# Patient Record
Sex: Male | Born: 1950 | Race: White | Hispanic: No | Marital: Married | Smoking: Former smoker
Health system: Southern US, Community
[De-identification: ages and names within clinical notes are randomized; demographics above are authoritative.]

## PROBLEM LIST (undated history)

## (undated) DIAGNOSIS — K219 Gastro-esophageal reflux disease without esophagitis: Secondary | ICD-10-CM

## (undated) DIAGNOSIS — Z9989 Dependence on other enabling machines and devices: Secondary | ICD-10-CM

## (undated) DIAGNOSIS — M1990 Unspecified osteoarthritis, unspecified site: Secondary | ICD-10-CM

## (undated) DIAGNOSIS — G4733 Obstructive sleep apnea (adult) (pediatric): Secondary | ICD-10-CM

## (undated) DIAGNOSIS — A0472 Enterocolitis due to Clostridium difficile, not specified as recurrent: Secondary | ICD-10-CM

## (undated) DIAGNOSIS — I739 Peripheral vascular disease, unspecified: Secondary | ICD-10-CM

## (undated) DIAGNOSIS — M79606 Pain in leg, unspecified: Secondary | ICD-10-CM

## (undated) DIAGNOSIS — I2510 Atherosclerotic heart disease of native coronary artery without angina pectoris: Secondary | ICD-10-CM

## (undated) DIAGNOSIS — I10 Essential (primary) hypertension: Secondary | ICD-10-CM

## (undated) DIAGNOSIS — I219 Acute myocardial infarction, unspecified: Secondary | ICD-10-CM

## (undated) DIAGNOSIS — G4700 Insomnia, unspecified: Secondary | ICD-10-CM

## (undated) DIAGNOSIS — J8410 Pulmonary fibrosis, unspecified: Secondary | ICD-10-CM

## (undated) DIAGNOSIS — C3490 Malignant neoplasm of unspecified part of unspecified bronchus or lung: Secondary | ICD-10-CM

## (undated) DIAGNOSIS — J189 Pneumonia, unspecified organism: Secondary | ICD-10-CM

## (undated) DIAGNOSIS — M545 Low back pain, unspecified: Secondary | ICD-10-CM

## (undated) DIAGNOSIS — M4800 Spinal stenosis, site unspecified: Secondary | ICD-10-CM

## (undated) DIAGNOSIS — F32A Depression, unspecified: Secondary | ICD-10-CM

## (undated) DIAGNOSIS — N400 Enlarged prostate without lower urinary tract symptoms: Secondary | ICD-10-CM

## (undated) DIAGNOSIS — F419 Anxiety disorder, unspecified: Secondary | ICD-10-CM

## (undated) DIAGNOSIS — E785 Hyperlipidemia, unspecified: Secondary | ICD-10-CM

## (undated) DIAGNOSIS — Z9981 Dependence on supplemental oxygen: Secondary | ICD-10-CM

## (undated) DIAGNOSIS — Z87442 Personal history of urinary calculi: Secondary | ICD-10-CM

## (undated) DIAGNOSIS — G8929 Other chronic pain: Secondary | ICD-10-CM

## (undated) DIAGNOSIS — F329 Major depressive disorder, single episode, unspecified: Secondary | ICD-10-CM

## (undated) HISTORY — DX: Insomnia, unspecified: G47.00

## (undated) HISTORY — DX: Major depressive disorder, single episode, unspecified: F32.9

## (undated) HISTORY — DX: Anxiety disorder, unspecified: F41.9

## (undated) HISTORY — PX: JOINT REPLACEMENT: SHX530

## (undated) HISTORY — DX: Depression, unspecified: F32.A

## (undated) HISTORY — DX: Pain in leg, unspecified: M79.606

## (undated) HISTORY — DX: Enterocolitis due to Clostridium difficile, not specified as recurrent: A04.72

## (undated) HISTORY — DX: Essential (primary) hypertension: I10

## (undated) HISTORY — PX: SHOULDER ARTHROSCOPY WITH SUBACROMIAL DECOMPRESSION AND OPEN ROTATOR C: SHX5688

## (undated) HISTORY — DX: Peripheral vascular disease, unspecified: I73.9

## (undated) HISTORY — PX: SPINE SURGERY: SHX786

## (undated) HISTORY — PX: APPENDECTOMY: SHX54

## (undated) HISTORY — DX: Unspecified osteoarthritis, unspecified site: M19.90

## (undated) HISTORY — PX: TONSILLECTOMY AND ADENOIDECTOMY: SUR1326

## (undated) HISTORY — DX: Hyperlipidemia, unspecified: E78.5

## (undated) HISTORY — DX: Pulmonary fibrosis, unspecified: J84.10

## (undated) HISTORY — PX: KNEE ARTHROSCOPY: SHX127

## (undated) HISTORY — DX: Atherosclerotic heart disease of native coronary artery without angina pectoris: I25.10

## (undated) HISTORY — PX: BACK SURGERY: SHX140

## (undated) HISTORY — PX: CORONARY ANGIOPLASTY WITH STENT PLACEMENT: SHX49

## (undated) HISTORY — PX: LAPAROSCOPIC CHOLECYSTECTOMY: SUR755

---

## 1987-03-01 DIAGNOSIS — J189 Pneumonia, unspecified organism: Secondary | ICD-10-CM

## 1987-03-01 DIAGNOSIS — I2510 Atherosclerotic heart disease of native coronary artery without angina pectoris: Secondary | ICD-10-CM

## 1987-03-01 HISTORY — DX: Atherosclerotic heart disease of native coronary artery without angina pectoris: I25.10

## 1987-03-01 HISTORY — DX: Pneumonia, unspecified organism: J18.9

## 1987-08-29 HISTORY — PX: CARDIAC CATHETERIZATION: SHX172

## 1987-09-29 HISTORY — PX: CORONARY ARTERY BYPASS GRAFT: SHX141

## 2000-02-29 DIAGNOSIS — I219 Acute myocardial infarction, unspecified: Secondary | ICD-10-CM

## 2000-02-29 HISTORY — DX: Acute myocardial infarction, unspecified: I21.9

## 2004-02-29 HISTORY — PX: SHOULDER ARTHROSCOPY W/ ROTATOR CUFF REPAIR: SHX2400

## 2007-03-01 HISTORY — PX: POSTERIOR LUMBAR FUSION: SHX6036

## 2009-04-08 ENCOUNTER — Encounter: Admission: RE | Admit: 2009-04-08 | Discharge: 2009-04-08 | Payer: Self-pay | Admitting: Anesthesiology

## 2010-02-28 HISTORY — PX: TOTAL HIP ARTHROPLASTY: SHX124

## 2010-03-21 ENCOUNTER — Encounter: Payer: Self-pay | Admitting: Orthopedic Surgery

## 2010-03-21 ENCOUNTER — Encounter: Payer: Self-pay | Admitting: Anesthesiology

## 2010-05-06 ENCOUNTER — Other Ambulatory Visit: Payer: Self-pay | Admitting: Anesthesiology

## 2010-05-06 ENCOUNTER — Ambulatory Visit
Admission: RE | Admit: 2010-05-06 | Discharge: 2010-05-06 | Disposition: A | Discharge status: Home / Self Care | Payer: BC Managed Care – HMO | Source: Ambulatory Visit | Attending: Anesthesiology | Admitting: Anesthesiology

## 2010-05-06 DIAGNOSIS — R52 Pain, unspecified: Secondary | ICD-10-CM

## 2010-09-06 ENCOUNTER — Inpatient Hospital Stay (HOSPITAL_BASED_OUTPATIENT_CLINIC_OR_DEPARTMENT_OTHER)
Admission: RE | Admit: 2010-09-06 | Discharge: 2010-09-06 | Disposition: A | Discharge status: Home / Self Care | Payer: BC Managed Care – HMO | Source: Ambulatory Visit | Attending: Interventional Cardiology | Admitting: Interventional Cardiology

## 2010-09-06 DIAGNOSIS — I25810 Atherosclerosis of coronary artery bypass graft(s) without angina pectoris: Secondary | ICD-10-CM | POA: Insufficient documentation

## 2010-09-06 DIAGNOSIS — R9439 Abnormal result of other cardiovascular function study: Secondary | ICD-10-CM | POA: Insufficient documentation

## 2010-09-23 NOTE — Cardiovascular Report (Signed)
NAME:  Richard Parrish, Richard Parrish NO.:  [id number]  MEDICAL RECORD NO.:  [id number]  LOCATION:                                 FACILITY:  PHYSICIAN:  Lyn Records, M.D.        DATE OF BIRTH:  DATE OF PROCEDURE:  09/06/2010 DATE OF DISCHARGE:                           CARDIAC CATHETERIZATION   INDICATIONS:  The patient underwent a nuclear perfusion study to assess risk prior to right hip replacement and rehabilitation.  The study demonstrated transient left ventricular enlargement with stress and also a larger defect involving the anterolateral and inferior wall.  The study is being done to assess coronary anatomy.  PROCEDURE PERFORMED: 1. Left heart cath. 2. Selective coronary angiography. 3. Left ventriculography. 4. Saphenous vein graft angiography.  DESCRIPTION:  A 4-French sheath was placed in the right femoral artery after 2 mg of Versed and 50 mcg of fentanyl.  We then used a 4-French A2 multipurpose catheter for hemodynamic recordings, left ventriculography by hand injection, and saphenous vein graft angiography.  We then used a 4-French #4 left Judkins catheter for left coronary angiography and a #4 right Judkins catheter for right coronary angiography.  Manual compression was used for hemostasis.  No complications occurred.  RESULTS: 1. Hemodynamic data:     a.     Aortic pressure 101/50 mmHg.     b.     Left ventricular pressure 102/21. 2. Left ventriculography:  Left ventricle demonstrates normal overall     contractility with EF of 55% and suggestion of mild inferior     hypokinesis.  No mitral regurgitation. 3. Coronary angiography.     a.     The left main coronary:  The left main coronary artery      contains no significant obstruction.     b.     Left anterior descending coronary:  Left anterior descending      is anatomically relatively small in distribution, just reaching      the left ventricular apex.  Gives origin to a large branching   first diagonal.  It also gives origin to a large septal      perforator.  No significant obstruction is noted in the LAD or its      branches.     c.     Circumflex artery:  Circumflex coronary artery is relatively      small in distribution.  It gives origin to a moderate-sized first      obtuse marginal and a smaller second obtuse marginal.  No      significant obstruction is noted.     d.     Right coronary:  The right coronary artery is totally      occluded in the aortoostial junction.  A conus branch arises from      this site.  The distal right coronary is supplied by a patent      saphenous vein graft. 4. Bypass graft angiography.     a.     Saphenous vein graft to the distal right coronary:  The      graft is large in diameter, patulous, and supplies an anastomosis  to the distal right coronary.  Beyond this anastomosis, a large      PDA and left ventricular branch arises.  The bypass graft itself      reveals a widely patent stent in the mid-to-distal body proximal      to which there is bulky eccentric disease throughout a region of      irregularity.  This entire region is obstructed in the 50-70%      range depending upon view.  No significant obstruction is noted in      the PDA or large left ventricular branch.  The PDA supplies the      apex.  ASSESSMENT: 1. Widely patent left coronary system with only mild-to-moderate     narrowing in the first diagonal.  The left coronary system is     relatively small. 2. Dominant native right coronary system with total occlusion of     proximal vessel. 3. Patent but diffusely diseased saphenous vein graft to the distal     right coronary with 50-75% intermedius stenosis in the mid and     proximal segment proximal to previously placed stent.  RECOMMENDATIONS:  The patient's right coronary saphenous vein graft is old and degenerated.  The proximal segment likely need stenting although there is not a high-grade obstruction  noted.  My clinical impression is that the patient should probably undergo his hip surgery firs and at some later date once he is recovered, we can come back and attempt to place a drug-eluting stent in this proximal-to-mid diffusely diseased segment using distal protection.  My consideration for not going forward with drug-eluting stenting is the high risk of complications with this graft and the need for prolonged dual-antiplatelet therapy.  He appears to have widely patent enough graft to be able to tolerate surgery and rehab without excessive risk.     Lyn Records, M.D.     HWS/MEDQ  D:  09/06/2010  T:  09/07/2010  Job:  [id number]  cc:   Dr. Doristine Counter Carlena Bjornstad D. Polite, M.D. Dr. Patsi Sears  Electronically Signed by Verdis Prime M.D. on 09/23/2010 11:28:11 AM

## 2011-02-17 ENCOUNTER — Other Ambulatory Visit: Payer: Self-pay | Admitting: Internal Medicine

## 2011-02-17 ENCOUNTER — Ambulatory Visit
Admission: RE | Admit: 2011-02-17 | Discharge: 2011-02-17 | Disposition: A | Discharge status: Home / Self Care | Payer: BC Managed Care – HMO | Source: Ambulatory Visit | Attending: Internal Medicine | Admitting: Internal Medicine

## 2011-02-17 DIAGNOSIS — M79669 Pain in unspecified lower leg: Secondary | ICD-10-CM

## 2011-02-25 ENCOUNTER — Encounter: Payer: Self-pay | Admitting: Surgery

## 2011-02-28 ENCOUNTER — Ambulatory Visit (INDEPENDENT_AMBULATORY_CARE_PROVIDER_SITE_OTHER): Payer: BC Managed Care – HMO | Admitting: Surgery

## 2011-02-28 ENCOUNTER — Encounter: Payer: Self-pay | Admitting: Surgery

## 2011-02-28 ENCOUNTER — Encounter (HOSPITAL_COMMUNITY): Payer: Self-pay | Admitting: Pharmacy Technician

## 2011-02-28 VITALS — BP 142/87 | HR 64 | Resp 16 | Ht 72.0 in | Wt 246.0 lb

## 2011-02-28 DIAGNOSIS — I739 Peripheral vascular disease, unspecified: Secondary | ICD-10-CM

## 2011-02-28 NOTE — Progress Notes (Signed)
[facility]   Patient name: Richard Parrish MRN: [id number] DOB: 12/03/1950 Sex: male   Referred by: Dr. Nehemiah Settle  Reason for referral:  Chief Complaint  Patient presents with  . PVD    claudication, abn. ABI's    HISTORY OF PRESENT ILLNESS: The patient comes in today for evaluation of claudication. In the left leg. He states that he can walk approximately 100 yards before his leg is L. It is alleviated with rest. However if he works or exercises to the point where his leg is in trouble a can take several days for her to recover. He recently had a duplex ultrasound which showed ABI on the right to the 0.92 and on the left be 0.49. This problem has been going on for several uterus. He has undergone hip and back surgery thinking that these were contributing factors however his pain and issues still persist.  The patient has an extensive vascular history. He underwent coronary artery bypass grafting in his 30s. He has subsequently undergone stenting to his right coronary and his LAD in 2001. He is a former smoker, he quit in 1990. He is medically managed for his hypertension and hypercholesterolemia.  The patient states that his left leg is extremely lifestyle limiting to him and wishes to have this addressed as soon as possible. He has undergone a venous ultrasound which was negative for DVT. He denies ulcers or nonhealing wounds.  Past Medical History  Diagnosis Date  . CAD (coronary artery disease)   . Hypertension   . Hyperlipidemia   . Anxiety   . Insomnia   . Pulmonary fibrosis   . Leg pain     Past Surgical History  Procedure Date  . Coronary artery bypass graft   . Rca stent   . Rca and rca stents     History   Social History  . Marital Status: Unknown    Spouse Name: N/A    Number of Children: N/A  . Years of Education: N/A   Occupational History  . Not on file.   Social History Main Topics  . Smoking status: Former Smoker    Quit  date: 02/24/1989  . Smokeless tobacco: Not on file  . Alcohol Use: Yes  . Drug Use: No  . Sexually Active:    Other Topics Concern  . Not on file   Social History Narrative  . No narrative on file    History reviewed. No pertinent family history.  Allergies as of 02/28/2011 - Review Complete 02/28/2011  Allergen Reaction Noted  . Ambien  02/25/2011    Current Outpatient Prescriptions on File Prior to Visit  Medication Sig Dispense Refill  . ALPRAZolam (XANAX) 0.5 MG tablet Take 0.5 mg by mouth at bedtime as needed.        Marland Kitchen aspirin 81 MG tablet Take 160 mg by mouth daily.        Marland Kitchen atorvastatin (LIPITOR) 40 MG tablet Take 40 mg by mouth daily.        Marland Kitchen ezetimibe (ZETIA) 10 MG tablet Take 10 mg by mouth daily.        Marland Kitchen gabapentin (NEURONTIN) 300 MG capsule Take 300 mg by mouth 3 (three) times daily.        Marland Kitchen HYDROcodone-acetaminophen (NORCO) 5-325 MG per tablet Take 1 tablet by mouth every 4 (four) hours as needed.        . hyoscyamine (ANASPAZ) 0.125 MG TBDP Place 0.125 mg under the tongue as  needed.        Marland Kitchen ibuprofen (ADVIL,MOTRIN) 200 MG tablet Take 200 mg by mouth every 6 (six) hours as needed.        . metaxalone (SKELAXIN) 800 MG tablet Take 800 mg by mouth 3 (three) times daily.        . metoprolol (TOPROL-XL) 50 MG 24 hr tablet Take 50 mg by mouth daily.        . nitroGLYCERIN (NITROSTAT) 0.4 MG SL tablet Place 0.4 mg under the tongue every 5 (five) minutes as needed.        Marland Kitchen omeprazole (PRILOSEC) 20 MG capsule Take 20 mg by mouth daily.        Marland Kitchen PARoxetine (PAXIL-CR) 25 MG 24 hr tablet Take 25 mg by mouth every morning.        . propranolol (INDERAL) 40 MG tablet Take 40 mg by mouth as needed.        . ramipril (ALTACE) 10 MG capsule Take 10 mg by mouth 2 (two) times daily.           REVIEW OF SYSTEMS: Cardiovascular: No chest pain, chest pressure, palpitations, orthopnea, or dyspnea on exertion. No claudication or rest pain,  No history of DVT or  phlebitis. Pulmonary: No productive cough, asthma or wheezing. Neurologic: No weakness, paresthesias, aphasia, or amaurosis. No dizziness. Hematologic: No bleeding problems or clotting disorders. Musculoskeletal: No joint pain or joint swelling. Gastrointestinal: No blood in stool or hematemesis Genitourinary: No dysuria or hematuria. Psychiatric:: No history of major depression. Integumentary: No rashes or ulcers. Constitutional: No fever or chills.  PHYSICAL EXAMINATION: General: The patient appears their stated age.  Vital signs are BP 142/87  Pulse 64  Resp 16  Ht 6' (1.829 m)  Wt 246 lb (111.585 kg)  BMI 33.36 kg/m2  SpO2 99% HEENT:  No gross abnormalities Pulmonary: Respirations are non-labored Abdomen: Soft and non-tender  Musculoskeletal: There are no major deformities.   Neurologic: No focal weakness or paresthesias are detected, Skin: There are no ulcer or rashes noted. Psychiatric: The patient has normal affect. Cardiovascular: There is a regular rate and rhythm without significant murmur appreciated. No carotid bruits palpable femoral pulses bilaterally  Diagnostic Studies: None  Medication Changes: None  Assessment:  Left leg claudication Plan: Discussed treatment options at this point which would include medical therapy versus proceeding with an angiogram. The patient very much wishes to proceed with some form of intervention. I discussed proceeding with an angiogram. I'll access the right groin and study both legs and intervene on the left leg if possible. We discussed the risks and complications and the fact that he may be a surgical candidate and I percutaneous candidate. He was get this done as soon as possible. I will schedule his procedure for Wednesday January 16     V. Charlena Cross, M.D. [facility] [place] Office: [phone] Pager:  [phone]

## 2011-03-03 ENCOUNTER — Other Ambulatory Visit: Payer: Self-pay

## 2011-03-15 ENCOUNTER — Encounter (HOSPITAL_COMMUNITY)
Admission: RE | Disposition: A | Discharge status: Home / Self Care | Payer: Self-pay | Source: Ambulatory Visit | Attending: Surgery

## 2011-03-15 ENCOUNTER — Ambulatory Visit (HOSPITAL_COMMUNITY)
Admission: RE | Admit: 2011-03-15 | Discharge: 2011-03-15 | Disposition: A | Discharge status: Home / Self Care | Payer: BC Managed Care – HMO | Source: Ambulatory Visit | Attending: Surgery | Admitting: Surgery

## 2011-03-15 ENCOUNTER — Other Ambulatory Visit: Payer: Self-pay

## 2011-03-15 DIAGNOSIS — G4700 Insomnia, unspecified: Secondary | ICD-10-CM | POA: Insufficient documentation

## 2011-03-15 DIAGNOSIS — F411 Generalized anxiety disorder: Secondary | ICD-10-CM | POA: Insufficient documentation

## 2011-03-15 DIAGNOSIS — I10 Essential (primary) hypertension: Secondary | ICD-10-CM | POA: Insufficient documentation

## 2011-03-15 DIAGNOSIS — I2510 Atherosclerotic heart disease of native coronary artery without angina pectoris: Secondary | ICD-10-CM | POA: Insufficient documentation

## 2011-03-15 DIAGNOSIS — J8410 Pulmonary fibrosis, unspecified: Secondary | ICD-10-CM | POA: Insufficient documentation

## 2011-03-15 DIAGNOSIS — E785 Hyperlipidemia, unspecified: Secondary | ICD-10-CM | POA: Insufficient documentation

## 2011-03-15 DIAGNOSIS — I70219 Atherosclerosis of native arteries of extremities with intermittent claudication, unspecified extremity: Secondary | ICD-10-CM | POA: Insufficient documentation

## 2011-03-15 DIAGNOSIS — I708 Atherosclerosis of other arteries: Secondary | ICD-10-CM | POA: Insufficient documentation

## 2011-03-15 HISTORY — PX: ABDOMINAL AORTAGRAM: SHX5454

## 2011-03-15 LAB — POCT I-STAT, CHEM 8
BUN: 24 mg/dL — ABNORMAL HIGH (ref 6–23)
Chloride: 107 mEq/L (ref 96–112)
Creatinine, Ser: 1 mg/dL (ref 0.50–1.35)
Potassium: 4 mEq/L (ref 3.5–5.1)
Sodium: 142 mEq/L (ref 135–145)
TCO2: 26 mmol/L (ref 0–100)

## 2011-03-15 LAB — POCT ACTIVATED CLOTTING TIME: Activated Clotting Time: 204 seconds

## 2011-03-15 SURGERY — ABDOMINAL AORTAGRAM
Anesthesia: LOCAL

## 2011-03-15 MED ORDER — SODIUM CHLORIDE 0.9 % IV SOLN: 1.0000 mL/kg/h | INTRAVENOUS | Status: DC

## 2011-03-15 MED ORDER — OXYCODONE-ACETAMINOPHEN 5-325 MG PO TABS
ORAL_TABLET | ORAL | Status: AC
  Filled 2011-03-15: qty 2

## 2011-03-15 MED ORDER — MIDAZOLAM HCL 2 MG/2ML IJ SOLN
INTRAMUSCULAR | Status: AC
  Filled 2011-03-15: qty 2

## 2011-03-15 MED ORDER — FENTANYL CITRATE 0.05 MG/ML IJ SOLN
INTRAMUSCULAR | Status: AC
  Filled 2011-03-15: qty 2

## 2011-03-15 MED ORDER — OXYCODONE-ACETAMINOPHEN 5-325 MG PO TABS
1.0000 | ORAL_TABLET | ORAL | Status: DC | PRN
  Administered 2011-03-15: 2 via ORAL

## 2011-03-15 MED ORDER — HEPARIN SODIUM (PORCINE) 1000 UNIT/ML IJ SOLN
INTRAMUSCULAR | Status: AC
  Filled 2011-03-15: qty 1

## 2011-03-15 MED ORDER — LIDOCAINE HCL (PF) 1 % IJ SOLN
INTRAMUSCULAR | Status: AC
  Filled 2011-03-15: qty 30

## 2011-03-15 MED ORDER — SODIUM CHLORIDE 0.9 % IV SOLN
INTRAVENOUS | Status: DC
  Administered 2011-03-15: 06:00:00 via INTRAVENOUS

## 2011-03-15 MED ORDER — HEPARIN (PORCINE) IN NACL 2-0.9 UNIT/ML-% IJ SOLN
INTRAMUSCULAR | Status: AC
  Filled 2011-03-15: qty 1000

## 2011-03-15 NOTE — Op Note (Signed)
[facility] [place]  Patient name: Richard Parrish MRN: [id number] DOB: 10-03-1950 Sex: male  03/15/2011 Pre-operative Diagnosis: Left leg claudication Post-operative diagnosis:  Same Surgeon:  Jorge Ny Procedure Performed:  1.  ultrasound access right femoral artery  2.  abdominal aortogram  3.  bilateral lower sternum a runoff  4.  third order catheterization  5.  failed attempt at subintimal recanalization     Indications:  This is a 61 year old gentleman lifestyle limiting claudication in the left leg. Ultrasound revealed decreased ankle brachial indices on the left. He comes in today for diagnostic study and possible intervention.  Procedure:  The patient was identified in the holding area and taken to room 8.  The patient was then placed supine on the table and prepped and draped in the usual sterile fashion.  A time out was called.  Ultrasound was used to evaluate the right common femoral artery.  It was patent .  A digital ultrasound image was acquired.  A 18-gauge needle was used to access the right common femoral artery under ultrasound guidance. An 035 wire was advanced into the aorta under fluoroscopic visualization. A 5 French sheath was placed.  An omniflush catheter was advanced over the wire to the level of L-1.  An abdominal angiogram was obtained.  Next, using the omniflush catheter and a benson wire, the aortic bifurcation was crossed and the catheter was placed into theleft external iliac artery and left runoff was obtained.  right runoff was performed via retrograde sheath injections.  Findings:   Aortogram:  The visualized portions of the suprarenal abdominal aorta showed no significant disease. There is no evidence of renal artery stenosis. There is a small infrarenal abdominal aortic aneurysm. Bilateral common and external iliac arteries are widely patent. There is a stenosis at the origin of the left hypogastric artery.  Right Lower  Extremity:  The right common femoral artery is widely patent right profunda femoral and superficial femoral artery are widely patent. The popliteal artery is patent throughout it's course. There is two-vessel runoff.  Left Lower Extremity:  The left common femoral artery is patent throughout is course. The profundofemoral artery is widely patent. Left superficial femoral artery occludes in the adductor canal. There is reconstitution of the above-knee popliteal artery. The main runoff is via the posterior tibial artery. All 3 tibial vessels are seen at the midcalf level. Presumably proximal disease skewers full visualization.  Intervention:  After the above images were obtained the decision was made to intervene. Over a Amplatz superstiff wire a 7 [place] Ansel 1 sheath was placed into the left external iliac artery. Next using a Glidewire and a quick cross catheter the superficial femoral artery was selected. Multiple attempts at subintimal recanalization were performed without successful reentry into the native artery. After significant failed attempts I elected to abort the procedure. Catheters and wires were removed patient taken the holding area for sheath pull.  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  #3  in a runoff the left leg as to the posterior tibial artery, however all 3 tibial vessels are seen in the mid calf. This may be the consequence of proximal disease.  #4  no significant disease within the right leg  #5  the patient will be brought back for discussions of repeat attempt at subintimal recanalization versus above-knee femoral-popliteal bypass    V. Durene Cal, M.D. [facility] [place] Office: [phone] Pager:  [phone]

## 2011-03-15 NOTE — Interval H&P Note (Signed)
History and Physical Interval Note:  03/15/2011 7:26 AM  Richard Parrish  has presented today for surgery, with the diagnosis of PVD  The various methods of treatment have been discussed with the patient and family. After consideration of risks, benefits and other options for treatment, the patient has consented to  Procedure(s): ABDOMINAL AORTAGRAM as a surgical intervention .  The patients' history has been reviewed, patient examined, no change in status, stable for surgery.  I have reviewed the patients' chart and labs.  Questions were answered to the patient's satisfaction.     BRABHAM IV, V. WELLS

## 2011-03-15 NOTE — H&P (View-Only) (Signed)
[facility] [place]   Patient name: Richard Parrish MRN: [id number] DOB: 08/17/1950 Sex: male   Referred by: Dr. Polite  Reason for referral:  Chief Complaint  Patient presents with  . PVD    claudication, abn. ABI's    HISTORY OF PRESENT ILLNESS: The patient comes in today for evaluation of claudication. In the left leg. He states that he can walk approximately 100 yards before his leg is L. It is alleviated with rest. However if he works or exercises to the point where his leg is in trouble a can take several days for her to recover. He recently had a duplex ultrasound which showed ABI on the right to the 0.92 and on the left be 0.49. This problem has been going on for several uterus. He has undergone hip and back surgery thinking that these were contributing factors however his pain and issues still persist.  The patient has an extensive vascular history. He underwent coronary artery bypass grafting in his 30s. He has subsequently undergone stenting to his right coronary and his LAD in 2001. He is a former smoker, he quit in 1990. He is medically managed for his hypertension and hypercholesterolemia.  The patient states that his left leg is extremely lifestyle limiting to him and wishes to have this addressed as soon as possible. He has undergone a venous ultrasound which was negative for DVT. He denies ulcers or nonhealing wounds.  Past Medical History  Diagnosis Date  . CAD (coronary artery disease)   . Hypertension   . Hyperlipidemia   . Anxiety   . Insomnia   . Pulmonary fibrosis   . Leg pain     Past Surgical History  Procedure Date  . Coronary artery bypass graft   . Rca stent   . Rca and rca stents     History   Social History  . Marital Status: Unknown    Spouse Name: N/A    Number of Children: N/A  . Years of Education: N/A   Occupational History  . Not on file.   Social History Main Topics  . Smoking status: Former Smoker    Quit  date: 02/24/1989  . Smokeless tobacco: Not on file  . Alcohol Use: Yes  . Drug Use: No  . Sexually Active:    Other Topics Concern  . Not on file   Social History Narrative  . No narrative on file    History reviewed. No pertinent family history.  Allergies as of 02/28/2011 - Review Complete 02/28/2011  Allergen Reaction Noted  . Ambien  02/25/2011    Current Outpatient Prescriptions on File Prior to Visit  Medication Sig Dispense Refill  . ALPRAZolam (XANAX) 0.5 MG tablet Take 0.5 mg by mouth at bedtime as needed.        . aspirin 81 MG tablet Take 160 mg by mouth daily.        . atorvastatin (LIPITOR) 40 MG tablet Take 40 mg by mouth daily.        . ezetimibe (ZETIA) 10 MG tablet Take 10 mg by mouth daily.        . gabapentin (NEURONTIN) 300 MG capsule Take 300 mg by mouth 3 (three) times daily.        . HYDROcodone-acetaminophen (NORCO) 5-325 MG per tablet Take 1 tablet by mouth every 4 (four) hours as needed.        . hyoscyamine (ANASPAZ) 0.125 MG TBDP Place 0.125 mg under the tongue as   needed.        . ibuprofen (ADVIL,MOTRIN) 200 MG tablet Take 200 mg by mouth every 6 (six) hours as needed.        . metaxalone (SKELAXIN) 800 MG tablet Take 800 mg by mouth 3 (three) times daily.        . metoprolol (TOPROL-XL) 50 MG 24 hr tablet Take 50 mg by mouth daily.        . nitroGLYCERIN (NITROSTAT) 0.4 MG SL tablet Place 0.4 mg under the tongue every 5 (five) minutes as needed.        . omeprazole (PRILOSEC) 20 MG capsule Take 20 mg by mouth daily.        . PARoxetine (PAXIL-CR) 25 MG 24 hr tablet Take 25 mg by mouth every morning.        . propranolol (INDERAL) 40 MG tablet Take 40 mg by mouth as needed.        . ramipril (ALTACE) 10 MG capsule Take 10 mg by mouth 2 (two) times daily.           REVIEW OF SYSTEMS: Cardiovascular: No chest pain, chest pressure, palpitations, orthopnea, or dyspnea on exertion. No claudication or rest pain,  No history of DVT or  phlebitis. Pulmonary: No productive cough, asthma or wheezing. Neurologic: No weakness, paresthesias, aphasia, or amaurosis. No dizziness. Hematologic: No bleeding problems or clotting disorders. Musculoskeletal: No joint pain or joint swelling. Gastrointestinal: No blood in stool or hematemesis Genitourinary: No dysuria or hematuria. Psychiatric:: No history of major depression. Integumentary: No rashes or ulcers. Constitutional: No fever or chills.  PHYSICAL EXAMINATION: General: The patient appears their stated age.  Vital signs are BP 142/87  Pulse 64  Resp 16  Ht 6' (1.829 m)  Wt 246 lb (111.585 kg)  BMI 33.36 kg/m2  SpO2 99% HEENT:  No gross abnormalities Pulmonary: Respirations are non-labored Abdomen: Soft and non-tender  Musculoskeletal: There are no major deformities.   Neurologic: No focal weakness or paresthesias are detected, Skin: There are no ulcer or rashes noted. Psychiatric: The patient has normal affect. Cardiovascular: There is a regular rate and rhythm without significant murmur appreciated. No carotid bruits palpable femoral pulses bilaterally  Diagnostic Studies: None  Medication Changes: None  Assessment:  Left leg claudication Plan: Discussed treatment options at this point which would include medical therapy versus proceeding with an angiogram. The patient very much wishes to proceed with some form of intervention. I discussed proceeding with an angiogram. I'll access the right groin and study both legs and intervene on the left leg if possible. We discussed the risks and complications and the fact that he may be a surgical candidate and I percutaneous candidate. He was get this done as soon as possible. I will schedule his procedure for Wednesday January 16     V. Wells Brabham IV, M.D. [facility] [place] Office: [phone] Pager:  [phone]   

## 2011-03-16 LAB — POCT ACTIVATED CLOTTING TIME
Activated Clotting Time: 188 seconds
Activated Clotting Time: 166 seconds

## 2011-03-22 ENCOUNTER — Other Ambulatory Visit: Payer: Self-pay

## 2011-03-31 ENCOUNTER — Encounter (HOSPITAL_COMMUNITY): Payer: Self-pay | Admitting: Pharmacy Technician

## 2011-04-11 MED ORDER — SODIUM CHLORIDE 0.9 % IV SOLN
INTRAVENOUS | Status: DC
  Administered 2011-04-12: 1000 mL via INTRAVENOUS

## 2011-04-12 ENCOUNTER — Other Ambulatory Visit: Payer: Self-pay | Admitting: *Deleted

## 2011-04-12 ENCOUNTER — Ambulatory Visit (HOSPITAL_COMMUNITY)
Admission: RE | Admit: 2011-04-12 | Discharge: 2011-04-12 | Disposition: A | Discharge status: Home / Self Care | Payer: BC Managed Care – PPO | Source: Ambulatory Visit | Attending: Surgery | Admitting: Surgery

## 2011-04-12 ENCOUNTER — Encounter (HOSPITAL_COMMUNITY)
Admission: RE | Disposition: A | Discharge status: Home / Self Care | Payer: Self-pay | Source: Ambulatory Visit | Attending: Surgery

## 2011-04-12 DIAGNOSIS — I739 Peripheral vascular disease, unspecified: Secondary | ICD-10-CM

## 2011-04-12 DIAGNOSIS — I10 Essential (primary) hypertension: Secondary | ICD-10-CM | POA: Insufficient documentation

## 2011-04-12 DIAGNOSIS — I70219 Atherosclerosis of native arteries of extremities with intermittent claudication, unspecified extremity: Secondary | ICD-10-CM | POA: Insufficient documentation

## 2011-04-12 DIAGNOSIS — E785 Hyperlipidemia, unspecified: Secondary | ICD-10-CM | POA: Insufficient documentation

## 2011-04-12 DIAGNOSIS — I2510 Atherosclerotic heart disease of native coronary artery without angina pectoris: Secondary | ICD-10-CM | POA: Insufficient documentation

## 2011-04-12 HISTORY — PX: ABDOMINAL AORTAGRAM: SHX5454

## 2011-04-12 LAB — POCT ACTIVATED CLOTTING TIME
Activated Clotting Time: 199 seconds
Activated Clotting Time: 199 seconds
Activated Clotting Time: 199 seconds

## 2011-04-12 SURGERY — ABDOMINAL AORTAGRAM
Anesthesia: LOCAL

## 2011-04-12 MED ORDER — HEPARIN (PORCINE) IN NACL 2-0.9 UNIT/ML-% IJ SOLN
INTRAMUSCULAR | Status: AC
  Filled 2011-04-12: qty 1000

## 2011-04-12 MED ORDER — MIDAZOLAM HCL 2 MG/2ML IJ SOLN
INTRAMUSCULAR | Status: AC
  Filled 2011-04-12: qty 2

## 2011-04-12 MED ORDER — CLOPIDOGREL BISULFATE 300 MG PO TABS
ORAL_TABLET | ORAL | Status: AC
  Filled 2011-04-12: qty 1

## 2011-04-12 MED ORDER — LIDOCAINE HCL (PF) 1 % IJ SOLN
INTRAMUSCULAR | Status: AC
  Filled 2011-04-12: qty 30

## 2011-04-12 MED ORDER — CLOPIDOGREL BISULFATE 75 MG PO TABS: 75.0000 mg | ORAL_TABLET | Freq: Every day | ORAL | Status: DC

## 2011-04-12 MED ORDER — FAMOTIDINE IN NACL 20-0.9 MG/50ML-% IV SOLN
INTRAVENOUS | Status: AC
  Filled 2011-04-12: qty 50

## 2011-04-12 MED ORDER — HYDROCODONE-ACETAMINOPHEN 5-325 MG PO TABS
2.0000 | ORAL_TABLET | Freq: Once | ORAL | Status: AC
  Administered 2011-04-12: 2 via ORAL

## 2011-04-12 MED ORDER — FENTANYL CITRATE 0.05 MG/ML IJ SOLN
INTRAMUSCULAR | Status: AC
  Filled 2011-04-12: qty 2

## 2011-04-12 MED ORDER — HYDROCODONE-ACETAMINOPHEN 5-325 MG PO TABS
ORAL_TABLET | ORAL | Status: AC
  Administered 2011-04-12: 2 via ORAL
  Filled 2011-04-12: qty 2

## 2011-04-12 MED ORDER — CLOPIDOGREL BISULFATE 300 MG PO TABS: 300.0000 mg | ORAL_TABLET | Freq: Once | ORAL | Status: DC

## 2011-04-12 MED ORDER — CLOPIDOGREL BISULFATE 75 MG PO TABS: 300.0000 mg | ORAL_TABLET | Freq: Once | ORAL | Status: DC

## 2011-04-12 NOTE — Op Note (Signed)
[facility]  Patient name: Richard Parrish MRN: [id number] DOB: 08-23-1950 Sex: male  04/12/2011 Pre-operative Diagnosis: Left leg claudication Post-operative diagnosis:  Same Surgeon:  Jorge Ny Procedure Performed:  1.  ultrasound access right femoral artery  2.  abdominal aortogram  3.  left lower extremity  runoff  4.  third order catheterization  5.  atherectomy left superficial femoral and popliteal artery  6.  stent left superficial femoral and popliteal artery    Indications:  Patient suffers from lifestyle limiting left leg claudication. Over a month ago he was brought in for diagnostic study and possible intervention. A occluded left superficial femoral and popliteal artery were found. I attempted to cross the lesion with a catheter and a wire but was unsuccessful. He comes back in today for a second attempt using the Crosser CTO/atherectomy device.  Procedure:  The patient was identified in the holding area and taken to room 8.  The patient was then placed supine on the table and prepped and draped in the usual sterile fashion.  A time out was called.  Ultrasound was used to evaluate the right common femoral artery.  It was patent .  A digital ultrasound image was acquired. The right femoral artery was accessed under ultrasound guidance with an 18-gauge needle. An 035 wire was advanced into the aorta under fluoroscopic visualization. A 5 French sheath was placed. Over the wire an omni-flush catheter was advanced to the level of L1 and an abdominal aortogram was obtained. Next using the Omni flush catheter and a Benson wire the aortic bifurcation was crossed. The catheter was placed in the left external iliac artery and a left leg runoff was performed.  Findings:   Aortogram:  Single renal arteries were identified bilaterally without evidence of stenosis. No significant infrarenal aortic disease was identified. No significant stenosis within the  iliac arterial system was identified. The hypogastric origin is obscured due to the hardware within his back  Left Lower Extremity:  Left common femoral artery is widely patent. Left profunda femoris widely patent the left superficial femoral artery is patent proximally however it does occlude in the adductor canal with reconstitution of the above-knee popliteal artery. Three-vessel runoff was obtained however the posterior tibial is the dominant vessel.  Intervention:  Over no 35 wire a 7 French sheath was placed into the distal left external iliac artery. The patient was fully heparinized. At the level was confirmed and checked and redosed based on his activated clotting time. Using the super catheter for the Crosser device the left superficial femoral artery was selected. The catheter was brought down to the origin of the stenosis. Next over a miracle brothers wire the 14S Crosser CTO/atherectomy device was loaded into the catheter and brought down to the stenosis. The device was then activated and used to cross the proximal As well as the stenosis. I performed an injection through theCrosser once I felt I had reentered the true lumen. This confirmed successful reentry in successful crossing with atherectomy of the lesion. The device was then removed. I predilated the tract using a 4 mm balloon I then elected to primarily stent this stenosis. I selected a Bard life stent solo 7 x 200 stent. It was then deployed within the proximal popliteal and distal superficial femoral artery. It was molded to confirmation with a 6 mm balloon. There was residual stenosis where the occluded area was I redilated this with the balloon with a slightly improved result. I  did not feel aggressive dilatation was warranted at this time. A completion study was performed which revealed resolution of the stenosis within the superficial femoral artery and popliteal artery. Runoff was unchanged. At this point catheters and wires were  removed the sheath was taken back to the right external iliac artery will be taken the holding area for sheath pull once the coagulation profile corrects  Impression:  #1  successful crossing of this total occlusion using theCrosser CTO/atherectomy device  #2  atherectomy and stenting of the left superficial femoral and popliteal artery with a 7 x 200 self-expanding   V. Durene Cal, M.D. [facility] [place] Office: [phone] Pager:  [phone]

## 2011-04-12 NOTE — Discharge Instructions (Signed)

## 2011-04-12 NOTE — H&P (Signed)
Patient name: Richard Parrish MRN: [id number] DOB: 22-Jan-1951 Sex: male  Referred by: Dr. Nehemiah Settle  Reason for referral:  Chief Complaint   Patient presents with   .  PVD     claudication, abn. ABI's    HISTORY OF PRESENT ILLNESS:  The patient comes in today for evaluation of claudication. In the left leg. He states that he can walk approximately 100 yards before his leg is L. It is alleviated with rest. However if he works or exercises to the point where his leg is in trouble a can take several days for her to recover. He recently had a duplex ultrasound which showed ABI on the right to the 0.92 and on the left be 0.49. This problem has been going on for several uterus. He has undergone hip and back surgery thinking that these were contributing factors however his pain and issues still persist.  The patient has an extensive vascular history. He underwent coronary artery bypass grafting in his 30s. He has subsequently undergone stenting to his right coronary and his LAD in 2001. He is a former smoker, he quit in 1990. He is medically managed for his hypertension and hypercholesterolemia.  The patient states that his left leg is extremely lifestyle limiting to him and wishes to have this addressed as soon as possible. He has undergone a venous ultrasound which was negative for DVT. He denies ulcers or nonhealing wounds.  Past Medical History   Diagnosis  Date   .  CAD (coronary artery disease)    .  Hypertension    .  Hyperlipidemia    .  Anxiety    .  Insomnia    .  Pulmonary fibrosis    .  Leg pain     Past Surgical History   Procedure  Date   .  Coronary artery bypass graft    .  Rca stent    .  Rca and rca stents     History    Social History   .  Marital Status:  Unknown     Spouse Name:  N/A     Number of Children:  N/A   .  Years of Education:  N/A    Occupational History   .  Not on file.    Social History Main Topics   .  Smoking status:  Former Smoker     Quit date:   02/24/1989   .  Smokeless tobacco:  Not on file   .  Alcohol Use:  Yes   .  Drug Use:  No   .  Sexually Active:     Other Topics  Concern   .  Not on file    Social History Narrative   .  No narrative on file    History reviewed. No pertinent family history.  Allergies as of 02/28/2011 - Review Complete 02/28/2011   Allergen  Reaction  Noted   .  Ambien   02/25/2011    Current Outpatient Prescriptions on File Prior to Visit   Medication  Sig  Dispense  Refill   .  ALPRAZolam (XANAX) 0.5 MG tablet  Take 0.5 mg by mouth at bedtime as needed.     Marland Kitchen  aspirin 81 MG tablet  Take 160 mg by mouth daily.     Marland Kitchen  atorvastatin (LIPITOR) 40 MG tablet  Take 40 mg by mouth daily.     Marland Kitchen  ezetimibe (ZETIA) 10 MG tablet  Take 10 mg by mouth  daily.     .  gabapentin (NEURONTIN) 300 MG capsule  Take 300 mg by mouth 3 (three) times daily.     Marland Kitchen  HYDROcodone-acetaminophen (NORCO) 5-325 MG per tablet  Take 1 tablet by mouth every 4 (four) hours as needed.     .  hyoscyamine (ANASPAZ) 0.125 MG TBDP  Place 0.125 mg under the tongue as needed.     Marland Kitchen  ibuprofen (ADVIL,MOTRIN) 200 MG tablet  Take 200 mg by mouth every 6 (six) hours as needed.     .  metaxalone (SKELAXIN) 800 MG tablet  Take 800 mg by mouth 3 (three) times daily.     .  metoprolol (TOPROL-XL) 50 MG 24 hr tablet  Take 50 mg by mouth daily.     .  nitroGLYCERIN (NITROSTAT) 0.4 MG SL tablet  Place 0.4 mg under the tongue every 5 (five) minutes as needed.     Marland Kitchen  omeprazole (PRILOSEC) 20 MG capsule  Take 20 mg by mouth daily.     Marland Kitchen  PARoxetine (PAXIL-CR) 25 MG 24 hr tablet  Take 25 mg by mouth every morning.     .  propranolol (INDERAL) 40 MG tablet  Take 40 mg by mouth as needed.     .  ramipril (ALTACE) 10 MG capsule  Take 10 mg by mouth 2 (two) times daily.      REVIEW OF SYSTEMS:  Cardiovascular: No chest pain, chest pressure, palpitations, orthopnea, or dyspnea on exertion. No claudication or rest pain, No history of DVT or phlebitis.    Pulmonary: No productive cough, asthma or wheezing.  Neurologic: No weakness, paresthesias, aphasia, or amaurosis. No dizziness.  Hematologic: No bleeding problems or clotting disorders.  Musculoskeletal: No joint pain or joint swelling.  Gastrointestinal: No blood in stool or hematemesis  Genitourinary: No dysuria or hematuria.  Psychiatric:: No history of major depression.  Integumentary: No rashes or ulcers.  Constitutional: No fever or chills.  PHYSICAL EXAMINATION:  General: The patient appears their stated age. Vital signs are BP 142/87  Pulse 64  Resp 16  Ht 6' (1.829 m)  Wt 246 lb (111.585 kg)  BMI 33.36 kg/m2  SpO2 99%  HEENT: No gross abnormalities  Pulmonary: Respirations are non-labored  Abdomen: Soft and non-tender  Musculoskeletal: There are no major deformities.  Neurologic: No focal weakness or paresthesias are detected,  Skin: There are no ulcer or rashes noted.  Psychiatric: The patient has normal affect.  Cardiovascular: There is a regular rate and rhythm without significant murmur appreciated. No carotid bruits palpable femoral pulses bilaterally  Diagnostic Studies:  None  Medication Changes:  None  Assessment:  Left leg claudication  Plan:  Discussed treatment options at this point which would include medical therapy versus proceeding with an angiogram. The patient very much wishes to proceed with some form of intervention. I discussed proceeding with an angiogram. I'll access the right groin and study both legs and intervene on the left leg if possible. We discussed the risks and complications and the fact that he may be a surgical candidate and I percutaneous candidate. He was get this done as soon as possible. Pt failed SIR last month and comes in today for repeat attempt.     Jorge Ny, M.D.  [facility]  Office: [phone]  Pager: [phone]

## 2011-04-13 LAB — POCT I-STAT, CHEM 8
Calcium, Ion: 1.19 mmol/L (ref 1.12–1.32)
Creatinine, Ser: 1.1 mg/dL (ref 0.50–1.35)
Glucose, Bld: 96 mg/dL (ref 70–99)
HCT: 44 % (ref 39.0–52.0)
Hemoglobin: 15 g/dL (ref 13.0–17.0)
Potassium: 4.2 mEq/L (ref 3.5–5.1)
TCO2: 25 mmol/L (ref 0–100)

## 2011-05-20 ENCOUNTER — Encounter: Payer: Self-pay | Admitting: Surgery

## 2011-05-23 ENCOUNTER — Encounter (INDEPENDENT_AMBULATORY_CARE_PROVIDER_SITE_OTHER): Payer: BC Managed Care – PPO | Admitting: *Deleted

## 2011-05-23 ENCOUNTER — Ambulatory Visit (INDEPENDENT_AMBULATORY_CARE_PROVIDER_SITE_OTHER): Payer: BC Managed Care – PPO | Admitting: Surgery

## 2011-05-23 ENCOUNTER — Encounter: Payer: Self-pay | Admitting: Surgery

## 2011-05-23 VITALS — BP 125/78 | HR 55 | Resp 16 | Ht 72.0 in | Wt 235.0 lb

## 2011-05-23 DIAGNOSIS — I739 Peripheral vascular disease, unspecified: Secondary | ICD-10-CM

## 2011-05-23 DIAGNOSIS — Z48812 Encounter for surgical aftercare following surgery on the circulatory system: Secondary | ICD-10-CM

## 2011-05-23 NOTE — Progress Notes (Signed)
[facility]   Patient name: Harlee Pursifull MRN: [id number] DOB: 03-Sep-1950 Sex: male     Chief Complaint  Patient presents with  . PVD    one month f/up Left leg stent done 04/12/11 with Labs    HISTORY OF PRESENT ILLNESS: The patient is back today for followup of his claudication. He underwent recanalization of his left superficial femoral artery using the Crosser device. The lesion was then stented with a 7 x 200 self-expanding stent. The patient states that his symptom improvement was immediate. He is extremely satisfied with the activity level he can now enjoyed. He has no complaints at this time.  Past Medical History  Diagnosis Date  . CAD (coronary artery disease)   . Hypertension   . Hyperlipidemia   . Anxiety   . Insomnia   . Pulmonary fibrosis   . Leg pain     Past Surgical History  Procedure Date  . Coronary artery bypass graft   . Rca stent   . Rca and rca stents 04/12/11    Left     History   Social History  . Marital Status: Unknown    Spouse Name: N/A    Number of Children: N/A  . Years of Education: N/A   Occupational History  . Not on file.   Social History Main Topics  . Smoking status: Former Smoker    Quit date: 02/24/1989  . Smokeless tobacco: Not on file  . Alcohol Use: Yes  . Drug Use: No  . Sexually Active:    Other Topics Concern  . Not on file   Social History Narrative  . No narrative on file    History reviewed. No pertinent family history.  Allergies as of 05/23/2011 - Review Complete 05/23/2011  Allergen Reaction Noted  . Ambien  02/25/2011    Current Outpatient Prescriptions on File Prior to Visit  Medication Sig Dispense Refill  . ALPRAZolam (XANAX) 0.5 MG tablet Take 0.5 mg by mouth at bedtime as needed. For sleep      . aspirin 81 MG tablet Take 81 mg by mouth daily.       Marland Kitchen atorvastatin (LIPITOR) 40 MG tablet Take 40 mg by mouth daily.        . cetirizine (ZYRTEC) 10 MG tablet Take 10  mg by mouth daily as needed. For allergies       . ezetimibe (ZETIA) 10 MG tablet Take 10 mg by mouth daily.        Marland Kitchen gabapentin (NEURONTIN) 300 MG capsule Take 300 mg by mouth 3 (three) times daily.        Marland Kitchen HYDROcodone-acetaminophen (NORCO) 7.5-325 MG per tablet Take 1 tablet by mouth every 6 (six) hours as needed.       . hyoscyamine (ANASPAZ) 0.125 MG TBDP Place 0.125 mg under the tongue as needed. For spasms      . metoprolol (TOPROL-XL) 50 MG 24 hr tablet Take 50 mg by mouth daily.       . nitroGLYCERIN (NITROSTAT) 0.4 MG SL tablet Place 0.4 mg under the tongue every 5 (five) minutes as needed. For chest pain      . omeprazole (PRILOSEC) 20 MG capsule Take 20 mg by mouth daily.        Marland Kitchen PARoxetine (PAXIL-CR) 25 MG 24 hr tablet Take 25 mg by mouth daily.      . ramipril (ALTACE) 10 MG capsule Take 10 mg by mouth 2 (two) times daily.  REVIEW OF SYSTEMS: Please see history of present illness. Otherwise no changes from prior visit  PHYSICAL EXAMINATION:   Vital signs are BP 125/78  Pulse 55  Resp 16  Ht 6' (1.829 m)  Wt 235 lb (106.595 kg)  BMI 31.87 kg/m2  SpO2 98% General: The patient appears their stated age. HEENT:  No gross abnormalities Pulmonary:  Non labored breathing Abdomen: Soft and non-tender Musculoskeletal: There are no major deformities. Neurologic: No focal weakness or paresthesias are detected, Skin: There are no ulcer or rashes noted. Psychiatric: The patient has normal affect. Cardiovascular: Palpable pedal pulse bilaterally right groin access site is soft without thrill   Diagnostic Studies Ultrasound today shows an ABI of 1.2 on the left at 1.2 on the right with triphasic waveforms. Duplex shows a widely patent stent.  Assessment: Status post recent canalize sedation of the left superficial femoral artery Plan: The patient is doing very well at this time. He has had no complications. He will be placed on our ultrasound surveillance protocol  and I will see him back in one year. I like for him to continue his Plavix indefinitely  V. Charlena Cross, M.D. [facility] Office: [phone] Pager:  [phone]

## 2011-05-23 NOTE — Procedures (Unsigned)
LOWER EXTREMITY ARTERIAL DUPLEX  INDICATION:  Followup left SFA stent  HISTORY: Diabetes:  Yes Cardiac:  Coronary artery disease, CABG Hypertension:  Yes Smoking:  Former Previous Surgery:  Left SFA stent placed 03/16/2011 by Dr. Myra Gianotti Hyperlipidemia  SINGLE LEVEL ARTERIAL EXAM                         RIGHT                LEFT Brachial:               114                  115 Anterior tibial:        146                  139 Posterior tibial:       142                  151 Peroneal: Ankle/Brachial Index:   1.27                 1.31  LOWER EXTREMITY ARTERIAL DUPLEX EXAM  DUPLEX:  Patent left superficial femoral artery with triphasic waveforms noted throughout the stent.  IMPRESSION: 1. Bilateral ankle brachial indices are within normal limits. 2. Patent left superficial femoral artery stent with triphasic     waveforms noted throughout the left arterial system.  ___________________________________________ V. Charlena Cross, MD  EM/MEDQ  D:  05/23/2011  T:  05/23/2011  Job:  [id number]

## 2011-08-22 ENCOUNTER — Other Ambulatory Visit: Payer: Self-pay | Admitting: *Deleted

## 2011-08-22 ENCOUNTER — Encounter: Payer: Self-pay | Admitting: Neurosurgery

## 2011-08-22 DIAGNOSIS — I70219 Atherosclerosis of native arteries of extremities with intermittent claudication, unspecified extremity: Secondary | ICD-10-CM

## 2011-08-22 DIAGNOSIS — Z48812 Encounter for surgical aftercare following surgery on the circulatory system: Secondary | ICD-10-CM

## 2011-08-23 ENCOUNTER — Ambulatory Visit (INDEPENDENT_AMBULATORY_CARE_PROVIDER_SITE_OTHER): Payer: BC Managed Care – PPO | Admitting: Neurosurgery

## 2011-08-23 ENCOUNTER — Other Ambulatory Visit: Payer: Self-pay | Admitting: Physician Assistant

## 2011-08-23 ENCOUNTER — Encounter (INDEPENDENT_AMBULATORY_CARE_PROVIDER_SITE_OTHER): Payer: BC Managed Care – PPO | Admitting: *Deleted

## 2011-08-23 ENCOUNTER — Ambulatory Visit
Admission: RE | Admit: 2011-08-23 | Discharge: 2011-08-23 | Disposition: A | Discharge status: Home / Self Care | Payer: BC Managed Care – PPO | Source: Ambulatory Visit | Attending: Physician Assistant | Admitting: Physician Assistant

## 2011-08-23 ENCOUNTER — Encounter: Payer: Self-pay | Admitting: Neurosurgery

## 2011-08-23 VITALS — BP 107/71 | HR 56 | Resp 14 | Ht 72.0 in | Wt 240.1 lb

## 2011-08-23 DIAGNOSIS — Z48812 Encounter for surgical aftercare following surgery on the circulatory system: Secondary | ICD-10-CM

## 2011-08-23 DIAGNOSIS — I70219 Atherosclerosis of native arteries of extremities with intermittent claudication, unspecified extremity: Secondary | ICD-10-CM

## 2011-08-23 DIAGNOSIS — R52 Pain, unspecified: Secondary | ICD-10-CM

## 2011-08-23 DIAGNOSIS — I739 Peripheral vascular disease, unspecified: Secondary | ICD-10-CM
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
## 2011-08-24 NOTE — Procedures (Unsigned)
LOWER EXTREMITY ARTERIAL DUPLEX  INDICATION:  Followup left superficial femoral artery stenting.  HISTORY: Diabetes:  Yes Cardiac:  Coronary artery disease, CABG Hypertension:  Yes Smoking:  Former Previous Surgery:  Left SFA stent placed 03/16/2011 by Dr. Myra Gianotti Hyperlipidemia.  SINGLE LEVEL ARTERIAL EXAM                         RIGHT                LEFT Brachial: Anterior tibial: Posterior tibial: Peroneal: Ankle/Brachial Index:   1.04                 1.14  LOWER EXTREMITY ARTERIAL DUPLEX EXAM  DUPLEX:  Patent left superficial femoral artery with triphasic wave forms noted throughout the stent.  IMPRESSION: 1. Bilateral ankle brachial indices are within normal limits and     stable compared to the previous exam. 2. Patent left superficial femoral artery stent with triphasic wave     forms noted through the left arterial system.  ___________________________________________ V. Charlena Cross, MD  EM/MEDQ  D:  08/23/2011  T:  08/23/2011  Job:  [id number]

## 2011-08-24 NOTE — Addendum Note (Signed)
Addended by: Sharee Pimple on: 08/24/2011 09:36 AM   Modules accepted: Orders

## 2011-11-24 ENCOUNTER — Encounter: Payer: Self-pay | Admitting: Neurosurgery

## 2011-11-25 ENCOUNTER — Encounter: Payer: Self-pay | Admitting: Neurosurgery

## 2011-11-25 ENCOUNTER — Encounter (INDEPENDENT_AMBULATORY_CARE_PROVIDER_SITE_OTHER): Payer: BC Managed Care – PPO | Admitting: *Deleted

## 2011-11-25 ENCOUNTER — Ambulatory Visit (INDEPENDENT_AMBULATORY_CARE_PROVIDER_SITE_OTHER): Payer: BC Managed Care – PPO | Admitting: Neurosurgery

## 2011-11-25 VITALS — BP 118/74 | HR 60 | Resp 16 | Ht 71.0 in | Wt 235.4 lb

## 2011-11-25 DIAGNOSIS — I739 Peripheral vascular disease, unspecified: Secondary | ICD-10-CM

## 2011-11-25 DIAGNOSIS — I70219 Atherosclerosis of native arteries of extremities with intermittent claudication, unspecified extremity: Secondary | ICD-10-CM | POA: Insufficient documentation

## 2011-11-25 DIAGNOSIS — Z48812 Encounter for surgical aftercare following surgery on the circulatory system: Secondary | ICD-10-CM

## 2011-11-25 NOTE — Progress Notes (Signed)
[facility] PAD/PVD Office Note  CC: PAD surveillance Referring Physician: Brabham  History of Present Illness: 61 year old male patient of Dr. Myra Gianotti status post left femoral artery stent placed January 2013. The patient denies any signs or symptoms of claudication, rest pain or no open ulcerations on the lower extremities. The patient states he's been to [place] deer hunting and has been able to walk 3-4 miles a day without difficulty.  Past Medical History  Diagnosis Date  . CAD (coronary artery disease)   . Hypertension   . Hyperlipidemia   . Anxiety   . Insomnia   . Pulmonary fibrosis   . Leg pain   . Peripheral vascular disease     ROS: [x]  Positive   [ ]  Denies    General: [ ]  Weight loss, [ ]  Fever, [ ]  chills Neurologic: [ ]  Dizziness, [ ]  Blackouts, [ ]  Seizure [ ]  Stroke, [ ]  "Mini stroke", [ ]  Slurred speech, [ ]  Temporary blindness; [ ]  weakness in arms or legs, [ ]  Hoarseness Cardiac: [ ]  Chest pain/pressure, [ ]  Shortness of breath at rest [ ]  Shortness of breath with exertion, [ ]  Atrial fibrillation or irregular heartbeat Vascular: [ ]  Pain in legs with walking, [ ]  Pain in legs at rest, [ ]  Pain in legs at night,  [ ]  Non-healing ulcer, [ ]  Blood clot in vein/DVT,   Pulmonary: [ ]  Home oxygen, [ ]  Productive cough, [ ]  Coughing up blood, [ ]  Asthma,  [ ]  Wheezing Musculoskeletal:  [ ]  Arthritis, [ ]  Low back pain, [ ]  Joint pain Hematologic: [ ]  Easy Bruising, [ ]  Anemia; [ ]  Hepatitis Gastrointestinal: [ ]  Blood in stool, [ ]  Gastroesophageal Reflux/heartburn, [ ]  Trouble swallowing Urinary: [ ]  chronic Kidney disease, [ ]  on HD - [ ]  MWF or [ ]  TTHS, [ ]  Burning with urination, [ ]  Difficulty urinating Skin: [ ]  Rashes, [ ]  Wounds Psychological: [ ]  Anxiety, [ ]  Depression   Social History History  Substance Use Topics  . Smoking status: Former Smoker    Quit date: 02/24/1989  . Smokeless tobacco: Not on file  . Alcohol  Use: Yes    Family History Family History  Problem Relation Age of Onset  . Heart disease Mother   . Cancer Father     Allergies  Allergen Reactions  . Zolpidem Tartrate     Vivid dreams    Current Outpatient Prescriptions  Medication Sig Dispense Refill  . ALPRAZolam (XANAX) 0.5 MG tablet Take 0.5 mg by mouth at bedtime as needed. For sleep      . aspirin 81 MG tablet Take 81 mg by mouth daily.       Marland Kitchen atorvastatin (LIPITOR) 40 MG tablet Take 40 mg by mouth daily.        . cetirizine (ZYRTEC) 10 MG tablet Take 10 mg by mouth daily as needed. For allergies       . clopidogrel (PLAVIX) 75 MG tablet Take 75 mg by mouth daily.      Marland Kitchen ezetimibe (ZETIA) 10 MG tablet Take 10 mg by mouth daily.        Marland Kitchen gabapentin (NEURONTIN) 300 MG capsule Take 300 mg by mouth 3 (three) times daily.        Marland Kitchen HYDROcodone-acetaminophen (NORCO) 7.5-325 MG per tablet Take 1 tablet by mouth every 6 (six) hours as needed.       . hyoscyamine (ANASPAZ) 0.125 MG TBDP Place  0.125 mg under the tongue as needed. For spasms      . ibuprofen (ADVIL,MOTRIN) 100 MG tablet Take 100 mg by mouth every 6 (six) hours as needed.      . metaxalone (SKELAXIN) 800 MG tablet as needed.      . metoprolol (TOPROL-XL) 50 MG 24 hr tablet Take 50 mg by mouth daily.       . Naproxen-Esomeprazole (VIMOVO) 500-20 MG TBEC Take 20 each by mouth daily.      . nitroGLYCERIN (NITROSTAT) 0.4 MG SL tablet Place 0.4 mg under the tongue every 5 (five) minutes as needed. For chest pain      . omeprazole (PRILOSEC) 20 MG capsule Take 20 mg by mouth daily.        Marland Kitchen PARoxetine (PAXIL-CR) 25 MG 24 hr tablet Take 25 mg by mouth daily.      . ramipril (ALTACE) 10 MG capsule Take 10 mg by mouth 2 (two) times daily.          Physical Examination  Filed Vitals:   11/25/11 1540  BP: 118/74  Pulse: 60  Resp: 16    Body mass index is 32.83 kg/(m^2).  General:  WDWN in NAD Gait: Normal HEENT: WNL Eyes: Pupils equal Pulmonary: normal  non-labored breathing , without Rales, rhonchi,  wheezing Cardiac: RRR, without  Murmurs, rubs or gallops; No carotid bruits Abdomen: soft, NT, no masses Skin: no rashes, ulcers noted Vascular Exam/Pulses: Palpable femoral pulses bilaterally, PT and DP pulses are palpable bilaterally  Extremities without ischemic changes, no Gangrene , no cellulitis; no open wounds;  Musculoskeletal: no muscle wasting or atrophy  Neurologic: A&O X 3; Appropriate Affect ; SENSATION: normal; MOTOR FUNCTION:  moving all extremities equally. Speech is fluent/normal  Non-Invasive Vascular Imaging: ABIs today are 1.04 on the right, 1.14 and triphasic on the left with a patent left femoral artery stent with no velocities greater than 200 cm a second  ASSESSMENT/PLAN: Asymptomatic patient with a history of claudication, doing well. The patient will followup in 6 months with repeat duplex and ABIs. The patient's questions were encouraged and answered, he is in agreement with this plan.  Lauree Chandler ANP  Clinic M.D.: Imogene Burn

## 2011-11-28 NOTE — Addendum Note (Signed)
Addended by: Sharee Pimple on: 11/28/2011 08:27 AM   Modules accepted: Orders

## 2012-01-02 ENCOUNTER — Other Ambulatory Visit: Payer: Self-pay | Admitting: *Deleted

## 2012-01-02 DIAGNOSIS — I739 Peripheral vascular disease, unspecified: Secondary | ICD-10-CM

## 2012-01-02 DIAGNOSIS — Z48812 Encounter for surgical aftercare following surgery on the circulatory system: Secondary | ICD-10-CM

## 2012-01-02 DIAGNOSIS — M79606 Pain in leg, unspecified: Secondary | ICD-10-CM

## 2012-01-16 ENCOUNTER — Ambulatory Visit: Payer: BC Managed Care – PPO | Admitting: Surgery

## 2012-04-28 ENCOUNTER — Emergency Department: Payer: Self-pay | Admitting: Internal Medicine

## 2012-05-08 ENCOUNTER — Encounter (HOSPITAL_BASED_OUTPATIENT_CLINIC_OR_DEPARTMENT_OTHER): Payer: BC Managed Care – PPO | Attending: General Surgery

## 2012-05-08 DIAGNOSIS — IMO0002 Reserved for concepts with insufficient information to code with codable children: Secondary | ICD-10-CM | POA: Insufficient documentation

## 2012-05-08 DIAGNOSIS — X118XXA Contact with other hot tap-water, initial encounter: Secondary | ICD-10-CM | POA: Insufficient documentation

## 2012-05-08 DIAGNOSIS — T24219A Burn of second degree of unspecified thigh, initial encounter: Secondary | ICD-10-CM | POA: Insufficient documentation
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
## 2012-05-28 ENCOUNTER — Ambulatory Visit: Payer: BC Managed Care – PPO | Admitting: Neurosurgery

## 2012-05-28 ENCOUNTER — Ambulatory Visit: Payer: BC Managed Care – PPO | Admitting: Surgery

## 2012-05-28 ENCOUNTER — Encounter (INDEPENDENT_AMBULATORY_CARE_PROVIDER_SITE_OTHER): Payer: BC Managed Care – PPO | Admitting: *Deleted

## 2012-05-28 DIAGNOSIS — Z48812 Encounter for surgical aftercare following surgery on the circulatory system: Secondary | ICD-10-CM

## 2012-05-28 DIAGNOSIS — I739 Peripheral vascular disease, unspecified: Secondary | ICD-10-CM

## 2012-05-28 DIAGNOSIS — I70219 Atherosclerosis of native arteries of extremities with intermittent claudication, unspecified extremity: Secondary | ICD-10-CM

## 2012-05-29 ENCOUNTER — Encounter: Payer: Self-pay | Admitting: Surgery

## 2012-05-29 ENCOUNTER — Encounter (HOSPITAL_BASED_OUTPATIENT_CLINIC_OR_DEPARTMENT_OTHER): Payer: BC Managed Care – PPO | Attending: General Surgery

## 2012-05-29 ENCOUNTER — Other Ambulatory Visit: Payer: Self-pay

## 2012-05-29 DIAGNOSIS — Z48812 Encounter for surgical aftercare following surgery on the circulatory system: Secondary | ICD-10-CM

## 2012-05-29 DIAGNOSIS — I739 Peripheral vascular disease, unspecified: Secondary | ICD-10-CM

## 2012-05-29 DIAGNOSIS — T24219A Burn of second degree of unspecified thigh, initial encounter: Secondary | ICD-10-CM | POA: Insufficient documentation

## 2012-05-29 DIAGNOSIS — IMO0002 Reserved for concepts with insufficient information to code with codable children: Secondary | ICD-10-CM | POA: Insufficient documentation

## 2012-09-27 ENCOUNTER — Other Ambulatory Visit: Payer: Self-pay | Admitting: Surgery

## 2012-11-13 ENCOUNTER — Other Ambulatory Visit: Payer: Self-pay | Admitting: *Deleted

## 2012-11-13 DIAGNOSIS — Z48812 Encounter for surgical aftercare following surgery on the circulatory system: Secondary | ICD-10-CM

## 2012-11-13 DIAGNOSIS — I739 Peripheral vascular disease, unspecified: Secondary | ICD-10-CM

## 2012-12-03 ENCOUNTER — Ambulatory Visit (INDEPENDENT_AMBULATORY_CARE_PROVIDER_SITE_OTHER)
Admission: RE | Admit: 2012-12-03 | Discharge: 2012-12-03 | Disposition: A | Discharge status: Home / Self Care | Payer: BC Managed Care – PPO | Source: Ambulatory Visit | Attending: Surgery | Admitting: Surgery

## 2012-12-03 ENCOUNTER — Ambulatory Visit (HOSPITAL_COMMUNITY)
Admission: RE | Admit: 2012-12-03 | Discharge: 2012-12-03 | Disposition: A | Discharge status: Home / Self Care | Payer: BC Managed Care – PPO | Source: Ambulatory Visit | Attending: Surgery | Admitting: Surgery

## 2012-12-03 DIAGNOSIS — I739 Peripheral vascular disease, unspecified: Secondary | ICD-10-CM

## 2012-12-03 DIAGNOSIS — Z48812 Encounter for surgical aftercare following surgery on the circulatory system: Secondary | ICD-10-CM

## 2012-12-04 ENCOUNTER — Other Ambulatory Visit: Payer: Self-pay | Admitting: *Deleted

## 2012-12-04 DIAGNOSIS — I739 Peripheral vascular disease, unspecified: Secondary | ICD-10-CM

## 2012-12-04 DIAGNOSIS — Z48812 Encounter for surgical aftercare following surgery on the circulatory system: Secondary | ICD-10-CM

## 2012-12-06 ENCOUNTER — Encounter: Payer: Self-pay | Admitting: Surgery

## 2012-12-12 ENCOUNTER — Ambulatory Visit (INDEPENDENT_AMBULATORY_CARE_PROVIDER_SITE_OTHER): Payer: BC Managed Care – PPO | Admitting: Interventional Cardiology

## 2012-12-12 ENCOUNTER — Encounter: Payer: Self-pay | Admitting: Interventional Cardiology

## 2012-12-12 VITALS — BP 132/68 | HR 58 | Ht 72.0 in | Wt 233.0 lb

## 2012-12-12 DIAGNOSIS — I70219 Atherosclerosis of native arteries of extremities with intermittent claudication, unspecified extremity: Secondary | ICD-10-CM

## 2012-12-12 DIAGNOSIS — I10 Essential (primary) hypertension: Secondary | ICD-10-CM | POA: Insufficient documentation

## 2012-12-12 DIAGNOSIS — E785 Hyperlipidemia, unspecified: Secondary | ICD-10-CM | POA: Insufficient documentation

## 2012-12-12 DIAGNOSIS — F329 Major depressive disorder, single episode, unspecified: Secondary | ICD-10-CM

## 2012-12-12 DIAGNOSIS — I739 Peripheral vascular disease, unspecified: Secondary | ICD-10-CM
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
## 2012-12-12 NOTE — Patient Instructions (Signed)
Your physician recommends that you continue on your current medications as directed. Please refer to the Current Medication list given to you today.  Your physician recommends that you schedule a follow-up appointment in: 3 months  Please follow up with your Primary Care Physician

## 2013-01-01 ENCOUNTER — Encounter: Payer: Self-pay | Admitting: Interventional Cardiology

## 2013-01-09 ENCOUNTER — Telehealth: Payer: Self-pay | Admitting: Interventional Cardiology

## 2013-01-09 DIAGNOSIS — R002 Palpitations: Secondary | ICD-10-CM

## 2013-01-09 NOTE — Telephone Encounter (Signed)
New Problem  Pt states that he is experiencing irregular heart beats for the last couple of days while he is sleeping. The feeling is"Pausing and picking back up" type of beat w/ a BP of 110/56.Marland Kitchen Request a call back to discuss.

## 2013-01-09 NOTE — Telephone Encounter (Signed)
lmom for pt to return call. Dr.Smith would like pt to wear a 48hr monitor.to determine if there are any significant arrythmias

## 2013-01-14 NOTE — Telephone Encounter (Signed)
2 nd attempt.called pt adv him Dr.Smith would like for him to wear a 48 holter monitor.pt was agreeable with plan. adv pt that a scheduler from our office will call to set up appt. pt verbalized understanding

## 2013-02-05 ENCOUNTER — Telehealth: Payer: Self-pay

## 2013-02-05 NOTE — Telephone Encounter (Signed)
Message copied by Jarvis Newcomer on Tue Feb 05, 2013  1:56 PM ------      Message from: Omar Person B      Created: Thu Jan 31, 2013 11:07 AM      Regarding: RE: Royal Piedra ,  I have left 3 vm for patient to call and schedule monitor.        ----- Message -----         From: Gerome Sam         Sent: 01/17/2013  11:18 AM           To: Orlin Hilding, CMA      Subject: MONITOR                                                  01/17/13 Forward to Jasmine December.       ------

## 2013-02-05 NOTE — Telephone Encounter (Signed)
lmom. several attempts have been made to reach pt . Per Dr.Smith pt  needs to wear a holter monitor. Scheduling has left several messages as well.

## 2013-03-20 ENCOUNTER — Ambulatory Visit: Payer: BC Managed Care – PPO | Admitting: Interventional Cardiology

## 2013-04-01 ENCOUNTER — Ambulatory Visit (INDEPENDENT_AMBULATORY_CARE_PROVIDER_SITE_OTHER): Payer: BC Managed Care – PPO | Admitting: Interventional Cardiology

## 2013-04-01 ENCOUNTER — Encounter: Payer: Self-pay | Admitting: Interventional Cardiology

## 2013-04-01 VITALS — BP 120/80 | HR 54 | Ht 72.0 in | Wt 239.0 lb

## 2013-04-01 DIAGNOSIS — I10 Essential (primary) hypertension: Secondary | ICD-10-CM

## 2013-04-01 DIAGNOSIS — I2510 Atherosclerotic heart disease of native coronary artery without angina pectoris: Secondary | ICD-10-CM

## 2013-04-01 DIAGNOSIS — I739 Peripheral vascular disease, unspecified: Secondary | ICD-10-CM

## 2013-04-01 DIAGNOSIS — E785 Hyperlipidemia, unspecified: Secondary | ICD-10-CM

## 2013-04-01 NOTE — Patient Instructions (Signed)
Your physician recommends that you continue on your current medications as directed. Please refer to the Current Medication list given to you today.  Your physician wants you to follow-up in: 6 months. You will receive a reminder letter in the mail two months in advance. If you don't receive a letter, please call our office to schedule the follow-up appointment.  

## 2013-04-01 NOTE — Progress Notes (Signed)
Patient ID: Richard Parrish, male   DOB: 19-Aug-1950, 63 y.o.   MRN: [id number]    [address] [place], [place]  [address] Phone: [phone] Fax:  [phone]  Date:  04/01/2013   ID:  Richard Parrish, DOB October 24, 1950, MRN [id number]  PCP:  Kandice Hams, MD   ASSESSMENT:  1. CAD, asymptomatic, with prior RCA saphenous vein graft stenting and residual proximal 75% stenosis 2. Hypertension  3. Hyperlipidemia  PLAN:  1. Six-month follow   SUBJECTIVE: Richard Parrish is a 63 y.o. male who is doing well. He denies syncope. He is not having chest pain. He is now swimming several times per week without angina. His back is improving.   Wt Readings from Last 3 Encounters:  04/01/13 239 lb (108.41 kg)  12/12/12 233 lb (105.688 kg)  11/25/11 235 lb 6.4 oz (106.777 kg)     Past Medical History  Diagnosis Date  . CAD (coronary artery disease)   . Hypertension   . Hyperlipidemia   . Anxiety   . Insomnia   . Pulmonary fibrosis   . Leg pain   . Peripheral vascular disease     Current Outpatient Prescriptions  Medication Sig Dispense Refill  . ALPRAZolam (XANAX) 0.5 MG tablet Take 0.5 mg by mouth at bedtime as needed. For sleep      . aspirin 81 MG tablet Take 81 mg by mouth daily.       Marland Kitchen atorvastatin (LIPITOR) 40 MG tablet Take 40 mg by mouth daily.        . cetirizine (ZYRTEC) 10 MG tablet Take 10 mg by mouth daily as needed. For allergies       . clopidogrel (PLAVIX) 75 MG tablet TAKE 1 TABLET BY MOUTH EVERY DAY  90 tablet  3  . esomeprazole (NEXIUM) 20 MG capsule Take 20 mg by mouth daily before breakfast.      . ezetimibe (ZETIA) 10 MG tablet Take 10 mg by mouth daily.        Marland Kitchen gabapentin (NEURONTIN) 300 MG capsule Take 300 mg by mouth 3 (three) times daily.        Marland Kitchen HYDROcodone-acetaminophen (NORCO) 7.5-325 MG per tablet Take 1 tablet by mouth every 6 (six) hours as needed.       . hyoscyamine (ANASPAZ) 0.125 MG TBDP Place 0.125 mg under the tongue as needed.  For spasms      . ibuprofen (ADVIL,MOTRIN) 100 MG tablet Take 800 mg by mouth every 6 (six) hours as needed.       . metaxalone (SKELAXIN) 800 MG tablet as needed.      . metoprolol (TOPROL-XL) 50 MG 24 hr tablet Take 50 mg by mouth daily.       . nitroGLYCERIN (NITROSTAT) 0.4 MG SL tablet Place 0.4 mg under the tongue every 5 (five) minutes as needed. For chest pain      . PARoxetine (PAXIL-CR) 25 MG 24 hr tablet Take 25 mg by mouth daily.      . ramipril (ALTACE) 10 MG capsule Take 10 mg by mouth 2 (two) times daily.         No current facility-administered medications for this visit.    Allergies:    Allergies  Allergen Reactions  . Zolpidem Tartrate     Vivid dreams    Social History:  The patient  reports that he quit smoking about 24 years ago. He does not have any smokeless tobacco history on file. He reports that  he drinks alcohol. He reports that he does not use illicit drugs.   ROS:  Please see the history of present illness.   Denies syncope, claudication, and transient neurological symptoms.   All other systems reviewed and negative.   OBJECTIVE: VS:  BP 120/80  Pulse 54  Ht 6' (1.829 m)  Wt 239 lb (108.41 kg)  BMI 32.41 kg/m2 Well nourished, well developed, in no acute distress, equivalent to his stated age  29: normal Neck: JVD flat. Carotid bruit absent  Cardiac:  normal S1, S2; RRR; no murmur Lungs:  clear to auscultation bilaterally, no wheezing, rhonchi or rales Abd: soft, nontender, no hepatomegaly Ext: Edema absent. Pulses 2+ and symmetric  Skin: warm and dry Neuro:  CNs 2-12 intact, no focal abnormalities noted  EKG:  Not repeated     Past Medical History  CAD s/p CABG with SVG to RCA 1989, and Stent RCA and LAD 2001.   Hypertension   Hypercholesterolemia   Chronic pain   Anxiety/ depression   Insomnia   idopathic pulmonary fibrosis treated with steriods and resolved, treated in [place]   PVD LLE     Signed, [place], MD 04/01/2013 11:52 AM

## 2013-05-29 ENCOUNTER — Other Ambulatory Visit: Payer: Self-pay | Admitting: Gastroenterology

## 2013-06-24 ENCOUNTER — Encounter (HOSPITAL_COMMUNITY): Payer: Self-pay | Admitting: Pharmacy Technician

## 2013-07-03 ENCOUNTER — Encounter (HOSPITAL_COMMUNITY): Payer: Self-pay | Admitting: *Deleted

## 2013-07-15 ENCOUNTER — Other Ambulatory Visit: Payer: Self-pay | Admitting: Internal Medicine

## 2013-07-15 ENCOUNTER — Other Ambulatory Visit: Payer: Self-pay | Admitting: Interventional Cardiology

## 2013-07-15 ENCOUNTER — Ambulatory Visit
Admission: RE | Admit: 2013-07-15 | Discharge: 2013-07-15 | Disposition: A | Discharge status: Home / Self Care | Payer: BC Managed Care – PPO | Source: Ambulatory Visit | Attending: Internal Medicine | Admitting: Internal Medicine

## 2013-07-15 DIAGNOSIS — R05 Cough: Secondary | ICD-10-CM

## 2013-07-15 DIAGNOSIS — R059 Cough, unspecified: Secondary | ICD-10-CM

## 2013-07-15 DIAGNOSIS — R9389 Abnormal findings on diagnostic imaging of other specified body structures: Secondary | ICD-10-CM

## 2013-07-16 ENCOUNTER — Ambulatory Visit (HOSPITAL_COMMUNITY)
Admission: RE | Admit: 2013-07-16 | Payer: BC Managed Care – PPO | Source: Ambulatory Visit | Admitting: Gastroenterology

## 2013-07-16 SURGERY — COLONOSCOPY WITH PROPOFOL
Anesthesia: Monitor Anesthesia Care

## 2013-07-18 ENCOUNTER — Ambulatory Visit
Admission: RE | Admit: 2013-07-18 | Discharge: 2013-07-18 | Disposition: A | Discharge status: Home / Self Care | Payer: BC Managed Care – PPO | Source: Ambulatory Visit | Attending: Internal Medicine | Admitting: Internal Medicine

## 2013-07-18 DIAGNOSIS — R9389 Abnormal findings on diagnostic imaging of other specified body structures: Secondary | ICD-10-CM

## 2013-07-21 ENCOUNTER — Emergency Department: Payer: Self-pay | Admitting: Emergency Medicine

## 2013-07-24 ENCOUNTER — Encounter: Payer: Self-pay | Admitting: Internal Medicine

## 2013-07-24 ENCOUNTER — Ambulatory Visit (INDEPENDENT_AMBULATORY_CARE_PROVIDER_SITE_OTHER): Payer: BC Managed Care – PPO | Admitting: Internal Medicine

## 2013-07-24 ENCOUNTER — Encounter (INDEPENDENT_AMBULATORY_CARE_PROVIDER_SITE_OTHER): Payer: Self-pay

## 2013-07-24 VITALS — BP 112/80 | HR 63 | Temp 98.1°F | Ht 72.0 in | Wt 235.0 lb

## 2013-07-24 DIAGNOSIS — R059 Cough, unspecified: Secondary | ICD-10-CM | POA: Insufficient documentation

## 2013-07-24 DIAGNOSIS — J8410 Pulmonary fibrosis, unspecified: Secondary | ICD-10-CM

## 2013-07-24 DIAGNOSIS — I10 Essential (primary) hypertension: Secondary | ICD-10-CM

## 2013-07-24 DIAGNOSIS — R05 Cough: Secondary | ICD-10-CM | POA: Insufficient documentation

## 2013-07-24 MED ORDER — VALSARTAN 320 MG PO TABS: 320.0000 mg | ORAL_TABLET | Freq: Every day | ORAL | Status: DC

## 2013-07-24 NOTE — Progress Notes (Signed)
Subjective:    Patient ID: Richard Parrish, male    DOB: 03/04/1950   MRN: [id number]  HPI  56 yowm quit smoking 1989 s apparent sequelae  referred to pulmonary clinic 07/24/2013 by Dr Delfina Redwood for ILD and chonic cough   07/24/2013 [facility] office visit/ Richard Parrish/ on Altace 10 bid Chief Complaint  Patient presents with  . Pulmonary Consult    Referred per Dr. Seward Carol. Pt c/o cough x 3 months- prod with minimal to moderate yellow sputum.  He also c/o SOB for the past 2 months- gets SOB when he uses upper body strength, but does okay walking.   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 No unusual exp hx or h/o childhood pna/ asthma or knowledge of premature birth.  Sleeping ok without nocturnal  or early am exacerbation  of respiratory  c/o's or need for noct saba. Also denies any obvious fluctuation of symptoms with weather or environmental changes or other aggravating or alleviating factors except as outlined above   Current Medications, Allergies, Complete Past Medical History, Past Surgical History, Family History, and Social History were reviewed in [employer] record.           Review of Systems  Constitutional: Positive for appetite change. Negative for fever, chills, activity change and unexpected weight change.  HENT: Negative for congestion,  dental problem, postnasal drip, rhinorrhea, sneezing, sore throat, trouble swallowing and voice change.   Eyes: Negative for visual disturbance.  Respiratory: Positive for cough and shortness of breath. Negative for choking.   Cardiovascular: Negative for chest pain and leg swelling.  Gastrointestinal: Negative for nausea, vomiting and abdominal pain.  Genitourinary: Negative for difficulty urinating.  Musculoskeletal: Negative for arthralgias.  Skin: Negative for rash.  Psychiatric/Behavioral: Negative for behavioral problems and confusion.       Objective:   Physical Exam amb wm nad  Wt Readings from Last 3 Encounters:  07/24/13 235 lb (106.595 kg)  04/01/13 239 lb (108.41 kg)  12/12/12 233 lb (105.688 kg)      HEENT: nl dentition, turbinates, and orophanx. Nl external ear canals without cough reflex   NECK :  without JVD/Nodes/TM/ nl carotid upstrokes bilaterally   LUNGS: no acc muscle use, bilateral insp crackles   CV:  RRR  no s3 or murmur or increase in P2, no edema   ABD:  soft and nontender with nl excursion in the supine position. No bruits or organomegaly, bowel sounds nl  MS:  warm without deformities, calf tenderness, cyanosis- NO CLUBBING  SKIN: warm and dry without lesions    NEURO:  alert, approp, no deficits     CT chest 07/18/13 Widespread interstitial disease, primarily in the lower lobe  regions. The calcification along the posterior left pleura suggests  underlying asbestos exposure. 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 There is underlying  emphysema. There is lower lobe bronchiectatic change bilaterally,  most notably in the left lower lobe.       Assessment & Plan:

## 2013-07-24 NOTE — Assessment & Plan Note (Signed)
Most likely this is not related to PF but more likely  Classic Upper airway cough syndrome, so named because it's frequently impossible to sort out how much is  CR/sinusitis with freq throat clearing (which can be related to primary GERD)   vs  causing  secondary (" extra esophageal")  GERD from wide swings in gastric pressure that occur with throat clearing, often  promoting self use of mint and menthol lozenges that reduce the lower esophageal sphincter tone and exacerbate the problem further in a cyclical fashion.   These are the same pts (now being labeled as having "irritable larynx syndrome" by some cough centers) who not infrequently have a history of having failed to tolerate ace inhibitors,  dry powder inhalers or biphosphonates or report having atypical reflux symptoms that don't respond to standard doses of PPI , and are easily confused as having aecopd or asthma flares by even experienced allergists/ pulmonologists.   rec trial off acei and return with pfts

## 2013-07-24 NOTE — Patient Instructions (Signed)
Stop altace  Start diovan 320 mg daily   As long as coughing > Try prilosec 20mg   Take 30-60 min before first meal of the day and Pepcid 20 mg one bedtime until cough is completely gone for at least a week without the need for cough suppression  Please schedule a follow up office visit in 6 weeks, call sooner if needed with pfts on return

## 2013-07-24 NOTE — Assessment & Plan Note (Signed)

## 2013-07-24 NOTE — Assessment & Plan Note (Signed)
-   s/p open lung bx [place] 2000/2001 rx prednisone x 1 year with resolution of desats on exercise - CT Chest 07/18/13 ? asbestosis - 07/24/2013  Walked RA x 3 laps @ 185 ft each stopped due to  End of study, no desat  The lack of desats p 15 y off prednisone suggests that most of this process is chronic and not progressive or representative of any active/ inflammatory process.

## 2013-09-05 ENCOUNTER — Ambulatory Visit (INDEPENDENT_AMBULATORY_CARE_PROVIDER_SITE_OTHER): Payer: BC Managed Care – PPO | Admitting: Internal Medicine

## 2013-09-05 ENCOUNTER — Encounter (INDEPENDENT_AMBULATORY_CARE_PROVIDER_SITE_OTHER): Payer: Self-pay

## 2013-09-05 ENCOUNTER — Encounter: Payer: Self-pay | Admitting: Internal Medicine

## 2013-09-05 VITALS — BP 100/64 | HR 60 | Temp 98.1°F | Ht 72.0 in | Wt 237.0 lb

## 2013-09-05 DIAGNOSIS — J8410 Pulmonary fibrosis, unspecified: Secondary | ICD-10-CM

## 2013-09-05 DIAGNOSIS — R05 Cough: Secondary | ICD-10-CM

## 2013-09-05 DIAGNOSIS — I10 Essential (primary) hypertension: Secondary | ICD-10-CM

## 2013-09-05 DIAGNOSIS — R059 Cough, unspecified: Secondary | ICD-10-CM

## 2013-09-05 NOTE — Progress Notes (Signed)
PFT done today. 

## 2013-09-05 NOTE — Patient Instructions (Addendum)
Nexium 40 mg Take 30-60 min before first meal of the day   Reduce toprol (lopressor) to one half daily   Please schedule a follow up visit in 6 months but call sooner if needed with pfts and cxr on return

## 2013-09-05 NOTE — Progress Notes (Signed)
Subjective:    Patient ID: Richard Parrish, male    DOB: 14-Jan-1951   MRN: [id number]    Brief patient profile:  45 yowm quit smoking 1989 s apparent sequelae  referred to pulmonary clinic 07/24/2013 by Dr Delfina Redwood for ILD and chonic cough. Worked as Estate agent fumed silica stopped in 4765[employer]     History of Present Illness  07/24/2013 1st [facility] office visit/ Richard Parrish/ on Altace 10 bid Chief Complaint  Patient presents with  . Pulmonary Consult    Referred per Dr. Seward Carol. Pt c/o cough x 3 months- prod with minimal to moderate yellow sputum.  He also c/o SOB for the past 2 months- gets SOB when he uses upper body strength, but does okay walking.   while in [facility] around 2000 noted abn cxr / eval in [place] > seen by Pulmonary doctor  and rheumatologist with neg w/u - developed  after exp to silica dust 6503-54 assoc with mild  doe with decreased sats took prednisone over a year and seemed to help and never took anymore after around end of 2001  and did ok until onset of cough in Feb 2015 initially dx as sinusitis/bronchitis some better x one month and initially productive now more of a hacking harsh upper airway pattern and no real assoc doe over baseline rec Stop altace Start diovan 320 mg daily  As long as coughing > Try prilosec 20mg   Take 30-60 min before first meal of the day and Pepcid 20 mg one bedtime until cough is completely gone for at least a week without the need for cough suppression   09/05/2013 f/u ov/Richard Parrish re:  Off acei, taking nexium p meals  Chief Complaint  Patient presents with  . Follow-up    Pt reports cough has pretty much resolved. He states that his breathing is unchanged.   breathing with exertion got better on prednisone stopped around 2001 and stayed fine until summer 2014  > since then Decrease swimming tolerance and upper body lifting, ok walking flat at nl pace x wherever he wants to go     No obvious day to day or daytime variabilty  or assoc chronic cough or cp or chest tightness, subjective wheeze overt sinus or hb symptoms. No unusual exp hx or h/o childhood pna/ asthma or knowledge of premature birth.  Sleeping ok without nocturnal  or early am exacerbation  of respiratory  c/o's or need for noct saba. Also denies any obvious fluctuation of symptoms with weather or environmental changes or other aggravating or alleviating factors except as outlined above   Current Medications, Allergies, Complete Past Medical History, Past Surgical History, Family History, and Social History were reviewed in [employer] record.  ROS  The following are not active complaints unless bolded sore throat, dysphagia, dental problems, itching, sneezing,  nasal congestion or excess/ purulent secretions, ear ache,   fever, chills, sweats, unintended wt loss, pleuritic or exertional cp, hemoptysis,  orthopnea pnd or leg swelling, presyncope, palpitations, heartburn, abdominal pain, anorexia, nausea, vomiting, diarrhea  or change in bowel or urinary habits, change in stools or urine, dysuria,hematuria,  rash, arthralgias, visual complaints, headache, numbness weakness or ataxia or problems with walking or coordination,  change in mood/affect or memory.               Objective:   Physical Exam  amb wm nad   09/05/2013         237  Wt Readings from  Last 3 Encounters:  07/24/13 235 lb (106.595 kg)  04/01/13 239 lb (108.41 kg)  12/12/12 233 lb (105.688 kg)      HEENT: nl dentition, turbinates, and orophanx. Nl external ear canals without cough reflex   NECK :  without JVD/Nodes/TM/ nl carotid upstrokes bilaterally   LUNGS: no acc muscle use,   bilateral insp crackles   CV:  RRR  no s3 or murmur or increase in P2, no edema   ABD:  soft and nontender with nl excursion in the supine position. No bruits or organomegaly, bowel sounds nl  MS:  warm without deformities, calf tenderness, cyanosis- NO CLUBBING  SKIN:  warm and dry without lesions          CT chest 07/18/13 Widespread interstitial disease, primarily in the lower lobe  regions. The calcification along the posterior left pleura suggests  underlying asbestos exposure. The appearance otherwise is most  consistent with usual interstitial pneumonitis. There is underlying  emphysema. There is lower lobe bronchiectatic change bilaterally,  most notably in the left lower lobe.       Assessment & Plan:

## 2013-09-06 NOTE — Assessment & Plan Note (Signed)
D/cd acei 07/24/2013 due to cough> resolved bp a bit too low, p only 60 so rec reduce toprol by one half which should improve his ex tol/energy level

## 2013-09-06 NOTE — Assessment & Plan Note (Signed)
-   trial off acei 07/24/2013 > resolved to his satisfaction, rec stay of ACEi

## 2013-09-06 NOTE — Assessment & Plan Note (Signed)
-   s/p open lung bx [place]"idiopathic" 2000/2001 rx prednisone x 1 year with resolution of desats on exercise - CT Chest 07/18/13 ? asbestosis -07/24/2013  Walked RA x 3 laps @ 185 ft each stopped due to  End of study, no desat - PFT's 09/05/2013   VC 4.90 (98%) s obst,  dlco 61 corrects to 75%  I had an extended discussion with the patient today lasting 15 to 20 minutes of a 25 minute visit on the following issues: No indication to restart pred or any other PF drug at present - will try to obtain path report from [place] if possible. Encouraged to be as active as possible and let me know is loosing ground with ex tol and return in 6 m for more points on the curve

## 2013-09-15 LAB — PULMONARY FUNCTION TEST
DL/VA % PRED: 75 %
DL/VA: 3.59 ml/min/mmHg/L
DLCO UNC % PRED: 61 %
DLCO unc: 21.59 ml/min/mmHg
FEF 25-75 POST: 2.55 L/s
FEF 25-75 Pre: 1.86 L/sec
FEF2575-%Change-Post: 37 %
FEF2575-%PRED-POST: 86 %
FEF2575-%Pred-Pre: 63 %
FEV1-%Change-Post: 8 %
FEV1-%PRED-POST: 83 %
FEV1-%Pred-Pre: 77 %
FEV1-PRE: 2.87 L
FEV1-Post: 3.12 L
FEV1FVC-%CHANGE-POST: 3 %
FEV1FVC-%PRED-PRE: 95 %
FEV6-%Change-Post: 5 %
FEV6-%Pred-Post: 88 %
FEV6-%Pred-Pre: 83 %
FEV6-Post: 4.17 L
FEV6-Pre: 3.96 L
FEV6FVC-%CHANGE-POST: 0 %
FEV6FVC-%Pred-Post: 104 %
FEV6FVC-%Pred-Pre: 103 %
FVC-%CHANGE-POST: 4 %
FVC-%PRED-POST: 84 %
FVC-%Pred-Pre: 80 %
FVC-Post: 4.21 L
FVC-Pre: 4.02 L
Post FEV1/FVC ratio: 74 %
Post FEV6/FVC ratio: 99 %
Pre FEV1/FVC ratio: 71 %
Pre FEV6/FVC Ratio: 99 %
RV % PRED: 77 %
RV: 1.89 L
TLC % pred: 91 %
TLC: 6.8 L

## 2013-10-04 ENCOUNTER — Encounter: Payer: Self-pay | Admitting: Internal Medicine

## 2013-10-21 ENCOUNTER — Encounter: Payer: Self-pay | Admitting: Internal Medicine

## 2013-11-18 ENCOUNTER — Other Ambulatory Visit: Payer: Self-pay | Admitting: Interventional Cardiology

## 2013-12-06 ENCOUNTER — Encounter: Payer: Self-pay | Admitting: Surgery

## 2013-12-09 ENCOUNTER — Ambulatory Visit (HOSPITAL_COMMUNITY)
Admission: RE | Admit: 2013-12-09 | Discharge: 2013-12-09 | Disposition: A | Discharge status: Home / Self Care | Payer: BC Managed Care – PPO | Source: Ambulatory Visit | Attending: Surgery | Admitting: Surgery

## 2013-12-09 ENCOUNTER — Encounter: Payer: Self-pay | Admitting: Surgery

## 2013-12-09 ENCOUNTER — Ambulatory Visit (INDEPENDENT_AMBULATORY_CARE_PROVIDER_SITE_OTHER): Payer: BC Managed Care – PPO | Admitting: Surgery

## 2013-12-09 ENCOUNTER — Ambulatory Visit (INDEPENDENT_AMBULATORY_CARE_PROVIDER_SITE_OTHER)
Admission: RE | Admit: 2013-12-09 | Discharge: 2013-12-09 | Disposition: A | Discharge status: Home / Self Care | Payer: BC Managed Care – PPO | Source: Ambulatory Visit | Attending: Surgery | Admitting: Surgery

## 2013-12-09 VITALS — BP 106/67 | HR 57 | Ht 72.0 in | Wt 240.0 lb

## 2013-12-09 DIAGNOSIS — I739 Peripheral vascular disease, unspecified: Secondary | ICD-10-CM

## 2013-12-09 DIAGNOSIS — E785 Hyperlipidemia, unspecified: Secondary | ICD-10-CM | POA: Diagnosis not present

## 2013-12-09 DIAGNOSIS — Z48812 Encounter for surgical aftercare following surgery on the circulatory system: Secondary | ICD-10-CM | POA: Insufficient documentation

## 2013-12-09 DIAGNOSIS — Z87891 Personal history of nicotine dependence: Secondary | ICD-10-CM | POA: Insufficient documentation

## 2013-12-09 DIAGNOSIS — I10 Essential (primary) hypertension: Secondary | ICD-10-CM | POA: Insufficient documentation

## 2013-12-09 NOTE — Progress Notes (Signed)
History of Present Illness  Cameryn Chrisley is a 63 y.o. male who presents for postoperative follow-up for: left stent done 04/12/11   Vascular Bypass surgery.  He underwent recanalization of his left superficial femoral artery using the Crosser device. The lesion was then stented with a 7 x 200 self-expanding stent. The patient states that his symptom improvement was immediate.  He continues to be very pleased with activity level since surgery.  He has and some hip flexor pain bilaterally that has improved with tramadol and Ibuprofen.     Review of Systems  Constitutional: Negative for fever, chills and weight loss.  HENT: Negative for congestion and sore throat.   Eyes: Negative for double vision.  Respiratory: Negative for shortness of breath.   Cardiovascular: Negative for chest pain and leg swelling.  Gastrointestinal: Negative for nausea and vomiting.  Genitourinary: Negative for urgency.  Musculoskeletal: Negative for neck pain.  Neurological: Negative for dizziness, focal weakness and headaches.  Psychiatric/Behavioral: The patient does not have insomnia.     Past Medical History   Diagnosis  Date   .  CAD (coronary artery disease)    .  Hypertension    .  Hyperlipidemia    .  Anxiety    .  Insomnia    .  Pulmonary fibrosis    .  Leg pain     Past Surgical History   Procedure  Date   .  Coronary artery bypass graft    .  Rca stent    .  Rca and rca stents  04/12/11     Left    History    Social History   .  Marital Status:  Unknown     Spouse Name:  N/A     Number of Children:  N/A   .  Years of Education:  N/A    Occupational History   .  Not on file.    Social History Main Topics   .  Smoking status:  Former Smoker     Quit date:  02/24/1989   .  Smokeless tobacco:  Not on file   .  Alcohol Use:  Yes   .  Drug Use:  No   .  Sexually Active:     Other Topics  Concern   .  Not on file    Social History Narrative   .  No narrative on file     History reviewed. No pertinent family history.  Allergies as of 05/23/2011 - Review Complete 05/23/2011   Allergen  Reaction  Noted   .  Ambien   02/25/2011    Current Outpatient Prescriptions on File Prior to Visit   Medication  Sig  Dispense  Refill   .  ALPRAZolam (XANAX) 0.5 MG tablet  Take 0.5 mg by mouth at bedtime as needed. For sleep     .  aspirin 81 MG tablet  Take 81 mg by mouth daily.     Marland Kitchen  atorvastatin (LIPITOR) 40 MG tablet  Take 40 mg by mouth daily.     .  cetirizine (ZYRTEC) 10 MG tablet  Take 10 mg by mouth daily as needed. For allergies     .  ezetimibe (ZETIA) 10 MG tablet  Take 10 mg by mouth daily.     Marland Kitchen  gabapentin (NEURONTIN) 300 MG capsule  Take 300 mg by mouth 3 (three) times daily.     Marland Kitchen  HYDROcodone-acetaminophen (NORCO) 7.5-325 MG  per tablet  Take 1 tablet by mouth every 6 (six) hours as needed.     .  hyoscyamine (ANASPAZ) 0.125 MG TBDP  Place 0.125 mg under the tongue as needed. For spasms     .  metoprolol (TOPROL-XL) 50 MG 24 hr tablet  Take 50 mg by mouth daily.     .  nitroGLYCERIN (NITROSTAT) 0.4 MG SL tablet  Place 0.4 mg under the tongue every 5 (five) minutes as needed. For chest pain     .  omeprazole (PRILOSEC) 20 MG capsule  Take 20 mg by mouth daily.     Marland Kitchen  PARoxetine (PAXIL-CR) 25 MG 24 hr tablet  Take 25 mg by mouth daily.     .  ramipril (ALTACE) 10 MG capsule  Take 10 mg by mouth 2 (two) times daily.         VASC. LAB Studies: 12/09/2013        ABI: Right 1.0;  Left 1.0;        Bypass stent is open  Physical Examination  Filed Vitals:   12/09/13 1607  BP: 106/67  Pulse: 57    Pt is A&O x 3 Gait is normal HEENT PERRL  Pulmonary L>R course rhonchi Heart RRR Left LE is without  Edema Dorsalis Pedis pulse is present Posterior tibial pulse is  present    Medical Decision Making  Oluwaseun Cremer is a 63 y.o. year old male who presents s/p left lower extremity stent superficial femoral artery   surgery 04/12/11.  He has normal  blood and is asymptomatic.  He will continue plavix.  He will follow up in 1 year for ABI's and arterial duplex.       I agree with the above S/p recanalization of his left superficial femoral artery using the Crosser device. The lesion was then stented with a 7 x 200 self-expanding stent I reviewed his u/s today which shows a widely patent stent and normal ABI's bilaterally. He has palpable pulses and no claudication symptoms F/u 1 year with duplex and ABI's  Wells Brabham

## 2014-01-20 ENCOUNTER — Encounter: Payer: Self-pay | Admitting: Surgery

## 2014-01-20 ENCOUNTER — Other Ambulatory Visit: Payer: Self-pay | Admitting: Interventional Cardiology

## 2014-01-22 NOTE — Telephone Encounter (Signed)
Belva Crome III, MD at 04/01/2013 11:52 AM  atorvastatin (LIPITOR) 40 MG tablet Take 40 mg by mouth daily   Patient Instructions     Your physician recommends that you continue on your current medications as directed. Please refer to the Current Medication list given to you today.  Your physician wants you to follow-up in: 6 months You will receive a reminder letter in the mail two months in advance. If you don't receive a letter, please call our office to schedule the follow-up appointment.

## 2014-02-06 ENCOUNTER — Encounter (HOSPITAL_COMMUNITY): Payer: Self-pay | Admitting: Surgery

## 2014-02-10 ENCOUNTER — Other Ambulatory Visit: Payer: Self-pay | Admitting: Interventional Cardiology

## 2014-02-28 HISTORY — PX: PERIPHERAL VASCULAR INTERVENTION: CATH118257

## 2014-04-28 DIAGNOSIS — M4306 Spondylolysis, lumbar region: Secondary | ICD-10-CM | POA: Diagnosis not present

## 2014-04-28 DIAGNOSIS — M169 Osteoarthritis of hip, unspecified: Secondary | ICD-10-CM | POA: Diagnosis not present

## 2014-04-30 DIAGNOSIS — M169 Osteoarthritis of hip, unspecified: Secondary | ICD-10-CM | POA: Diagnosis not present

## 2014-04-30 DIAGNOSIS — M4306 Spondylolysis, lumbar region: Secondary | ICD-10-CM | POA: Diagnosis not present

## 2014-05-15 DIAGNOSIS — L739 Follicular disorder, unspecified: Secondary | ICD-10-CM | POA: Diagnosis not present

## 2014-05-21 ENCOUNTER — Telehealth: Payer: Self-pay | Admitting: Interventional Cardiology

## 2014-05-21 DIAGNOSIS — S83242A Other tear of medial meniscus, current injury, left knee, initial encounter: Secondary | ICD-10-CM | POA: Diagnosis not present

## 2014-05-21 NOTE — Telephone Encounter (Signed)
New message     Request for surgical clearance:  1. What type of surgery is being performed?MRI  2. When is this surgery scheduled?  Not scheduled  3. Are there any medications that need to be held prior to surgery and how long?  No medicatins----need proof of all of his stents---what kind does he have?  Name of physician performing surgery? Dr Alfonso Ramus 4. What is your office phone and fax number?  Fax [phone]

## 2014-05-22 NOTE — Telephone Encounter (Signed)
Follow Up        Apolonio Schneiders calling stating that she needs information on pt's stints due to upcoming MRI. Please call back and advise.

## 2014-05-26 NOTE — Telephone Encounter (Signed)
Follow up      Calling because pt still have not received info regarding kind of stents pt had put in. He is needing an MRI.  Please call soon with an answer.

## 2014-05-27 NOTE — Telephone Encounter (Signed)
I do not have the prior stent type. The stent was not placed here in [place]. Older stents or feromagnetically active and there will be some risk with MRI. Asked the patient if he has a stent card from his previous intervention.  The artery that contains the stent is totally occluded and supplied by a bypass graft. The risk of dislodgment and injury would be relatively low I think although definitely present.

## 2014-05-27 NOTE — Telephone Encounter (Signed)
Spoke with Apolonio Schneiders and faxed her telephone note

## 2014-06-03 DIAGNOSIS — L309 Dermatitis, unspecified: Secondary | ICD-10-CM | POA: Diagnosis not present

## 2014-06-03 DIAGNOSIS — L739 Follicular disorder, unspecified: Secondary | ICD-10-CM | POA: Diagnosis not present

## 2014-06-04 ENCOUNTER — Other Ambulatory Visit: Payer: Self-pay

## 2014-06-04 MED ORDER — METOPROLOL SUCCINATE ER 50 MG PO TB24: ORAL_TABLET | ORAL | Status: DC

## 2014-06-04 MED ORDER — ATORVASTATIN CALCIUM 40 MG PO TABS: 40.0000 mg | ORAL_TABLET | Freq: Every day | ORAL | Status: DC

## 2014-06-10 DIAGNOSIS — I10 Essential (primary) hypertension: Secondary | ICD-10-CM | POA: Diagnosis not present

## 2014-06-10 DIAGNOSIS — F418 Other specified anxiety disorders: Secondary | ICD-10-CM | POA: Diagnosis not present

## 2014-06-17 DIAGNOSIS — M25562 Pain in left knee: Secondary | ICD-10-CM | POA: Diagnosis not present

## 2014-06-24 DIAGNOSIS — S83242D Other tear of medial meniscus, current injury, left knee, subsequent encounter: Secondary | ICD-10-CM | POA: Diagnosis not present

## 2014-06-25 DIAGNOSIS — F329 Major depressive disorder, single episode, unspecified: Secondary | ICD-10-CM | POA: Diagnosis not present

## 2014-06-25 DIAGNOSIS — G894 Chronic pain syndrome: Secondary | ICD-10-CM | POA: Diagnosis not present

## 2014-06-25 DIAGNOSIS — Z23 Encounter for immunization: Secondary | ICD-10-CM | POA: Diagnosis not present

## 2014-06-25 DIAGNOSIS — I739 Peripheral vascular disease, unspecified: Secondary | ICD-10-CM | POA: Diagnosis not present

## 2014-06-25 DIAGNOSIS — Z0000 Encounter for general adult medical examination without abnormal findings: Secondary | ICD-10-CM | POA: Diagnosis not present

## 2014-06-25 DIAGNOSIS — I2510 Atherosclerotic heart disease of native coronary artery without angina pectoris: Secondary | ICD-10-CM | POA: Diagnosis not present

## 2014-06-25 DIAGNOSIS — M545 Low back pain: Secondary | ICD-10-CM | POA: Diagnosis not present

## 2014-06-25 DIAGNOSIS — Z125 Encounter for screening for malignant neoplasm of prostate: Secondary | ICD-10-CM | POA: Diagnosis not present

## 2014-06-25 DIAGNOSIS — E782 Mixed hyperlipidemia: Secondary | ICD-10-CM | POA: Diagnosis not present

## 2014-06-25 DIAGNOSIS — G4733 Obstructive sleep apnea (adult) (pediatric): Secondary | ICD-10-CM | POA: Diagnosis not present

## 2014-06-25 DIAGNOSIS — I10 Essential (primary) hypertension: Secondary | ICD-10-CM | POA: Diagnosis not present

## 2014-06-25 DIAGNOSIS — Z1389 Encounter for screening for other disorder: Secondary | ICD-10-CM | POA: Diagnosis not present

## 2014-06-26 ENCOUNTER — Other Ambulatory Visit: Payer: Self-pay | Admitting: Gastroenterology

## 2014-07-01 ENCOUNTER — Telehealth: Payer: Self-pay | Admitting: Interventional Cardiology

## 2014-07-10 NOTE — Progress Notes (Signed)
Cardiology Office Note   Date:  07/11/2014   ID:  Richard Parrish, DOB 04-07-1950, MRN [id number]  PCP:  Kandice Hams, MD  Cardiologist:  Dr. Daneen Schick     Chief Complaint  Patient presents with  . Coronary Artery Disease     History of Present Illness: Richard Parrish is a 64 y.o. male with a hx of CAD s/p CABG and prior S-RCA graft stent with residual proximal 75%, HTN, HL, PAD s/p L SFA stent in 2013 wth Dr. Trula Slade, ILD.  Last seen by Dr. Daneen Schick 03/2013.  Since last seen, he has been doing well. He swims at the [facility] 2-3 days a week. He does so for about 30-45 minutes. He denies chest discomfort. He does get out of breath when he tries to sprint. Overall, he is NYHA 2. He denies orthopnea, PND or edema. He sleeps with CPAP. He denies syncope or near syncope. He has a torn left meniscus and needs to undergo arthroscopic knee surgery for repair. He returns to the office today for surgical clearance.   Studies/Reports Reviewed Today:  [facility] 08/2010 RESULTS: Left ventriculography: EF of 55% and suggestion of mild inferior HK LM:   no significant obstruction. LAD:   No significant obstruction is noted in the LAD or its branches. LCx:  No significant obstruction is noted. RCA:   totally occluded in the aortoostial junction.  Saphenous vein graft to the distal right coronary:  widely patent stent in the mid-to-distal body proximal to which there is 50-70%   Past Medical History  Diagnosis Date  . Coronary artery disease 1989    cabg with dvg to rca   . Depression   . Osteoarthritis     hip  . CAD (coronary artery disease)   . Hypertension   . Hyperlipidemia   . Anxiety   . Insomnia   . Pulmonary fibrosis   . Leg pain   . Peripheral vascular disease     Past Surgical History  Procedure Laterality Date  . Coronary artery bypass graft    . Rca stent    . Rca and rca stents  04/12/11    Left   . Abdominal surgery  04/12/11    Abdominal Angiogram  . Back  surgery      lumbar fusion  . Joint replacement      right hip  . Abdominal aortagram N/A 03/15/2011    Procedure: ABDOMINAL Maxcine Ham;  Surgeon: Serafina Mitchell, MD;  Location: [facility] CATH LAB;  Service: Cardiovascular;  Laterality: N/A;  . Abdominal aortagram N/A 04/12/2011    Procedure: ABDOMINAL Maxcine Ham;  Surgeon: Serafina Mitchell, MD;  Location: [facility] CATH LAB;  Service: Cardiovascular;  Laterality: N/A;     Current Outpatient Prescriptions  Medication Sig Dispense Refill  . ALPRAZolam (XANAX) 0.5 MG tablet Take 0.5 mg by mouth at bedtime as needed. For sleep    . aspirin 81 MG tablet Take 81 mg by mouth daily.    Marland Kitchen atorvastatin (LIPITOR) 40 MG tablet Take 1 tablet (40 mg total) by mouth daily. 90 tablet 1  . Calcium Carbonate-Vitamin D (CALCIUM + D PO) Take by mouth.    . clopidogrel (PLAVIX) 75 MG tablet Take 75 mg by mouth daily with breakfast.    . ezetimibe (ZETIA) 10 MG tablet Take 10 mg by mouth daily.    Marland Kitchen gabapentin (NEURONTIN) 300 MG capsule Take 300 mg by mouth 3 (three) times daily.    Marland Kitchen ibuprofen (ADVIL,MOTRIN)  200 MG tablet Take 600 mg by mouth every 6 (six) hours as needed for mild pain.    . metoprolol succinate (TOPROL-XL) 50 MG 24 hr tablet TAKE 1 TABLET DAILY (NEED APPOINTMENT) 90 tablet 1  . Multiple Vitamin (MULTIVITAMIN) tablet Take 1 tablet by mouth daily.    . nitroGLYCERIN (NITROSTAT) 0.4 MG SL tablet Place 0.4 mg under the tongue every 5 (five) minutes as needed for chest pain.    . Omega-3 Fatty Acids (FISH OIL) 1000 MG CAPS Take 1,000 mg by mouth daily.     Marland Kitchen PARoxetine (PAXIL) 40 MG tablet Take 40 mg by mouth every morning.    . propranolol (INDERAL) 40 MG tablet Take 40 mg by mouth 3 (three) times daily.    . ramipril (ALTACE) 10 MG capsule Take 10 mg by mouth daily.    . traMADol (ULTRAM) 50 MG tablet Take 50 mg by mouth. Take 11/2 tablet total of '75mg'$  q 6hrs prn    . valsartan (DIOVAN) 320 MG tablet Take 1 tablet (320 mg total) by mouth daily. 30 tablet 11    No current facility-administered medications for this visit.    Allergies:   Ambien and Zolpidem tartrate    Social History:  The patient  reports that he quit smoking about 27 years ago. His smoking use included Cigarettes. He has a 30 pack-year smoking history. He has never used smokeless tobacco. He reports that he drinks alcohol. He reports that he does not use illicit drugs.   Family History:  The patient's family history includes Cancer in his father; Heart disease in his mother.    ROS:   Please see the history of present illness.   Review of Systems  Hematologic/Lymphatic: Bruises/bleeds easily.  Musculoskeletal: Positive for joint pain.  All other systems reviewed and are negative.     PHYSICAL EXAM: VS:  BP 122/82 mmHg  Pulse 58  Ht 6' (1.829 m)  Wt 235 lb 12.8 oz (106.958 kg)  BMI 31.97 kg/m2    Wt Readings from Last 3 Encounters:  07/11/14 235 lb 12.8 oz (106.958 kg)  12/09/13 240 lb (108.863 kg)  09/05/13 237 lb (107.502 kg)     GEN: Well nourished, well developed, in no acute distress HEENT: normal Neck: no JVD,   no masses Cardiac:  Normal S1/S2, RRR; no murmur ,  no rubs or gallops, no edema   Respiratory:  Diffuse dry crackles bilaterally, no wheezing . GI: soft, nontender, nondistended, + BS MS: no deformity or atrophy Skin: warm and dry  Neuro:  CNs II-XII intact, Strength and sensation are intact Psych: Normal affect   EKG:  EKG is ordered today.  It demonstrates:   Sinus brady, HR 58, normal axis, no change from prior tracing.    Recent Labs: No results found for requested labs within last 365 days.    Lipid Panel No results found for: CHOL, TRIG, HDL, CHOLHDL, VLDL, LDLCALC, LDLDIRECT    ASSESSMENT AND PLAN:  Pre-operative cardiovascular examination The patient does not have any unstable cardiac conditions.  Upon evaluation today, he can achieve 4 METs or greater without anginal symptoms.  According to [facility] and AHA guidelines, he  requires no further cardiac workup prior to his noncardiac surgery and should be at acceptable risk.  Our service is available as necessary in the perioperative period. He should remain on aspirin throughout the perioperative period, if at all possible. If Plavix is to be held for the perioperative period, this would be  acceptable. It can be resumed once it is felt to be safe from a surgical standpoint. He should remain on beta blocker therapy to decrease the risks of cardiovascular complications.  Coronary artery disease involving native coronary artery of native heart without angina pectoris No angina. Continue aspirin, Plavix, statin, beta blocker, ARB.  Essential hypertension Controlled.  Hyperlipidemia Continue statin. This is managed by primary care.  Peripheral vascular disease  Follow-up with vascular surgery as planned.  Current medicines are reviewed at length with the patient today.  Concerns regarding medicines are as outlined above.  The following changes have been made:    None    Labs/ tests ordered today include:  No orders of the defined types were placed in this encounter.    Disposition:   FU with Dr. Daneen Schick 6 mos.   Signed, Versie Starks, MHS 07/11/2014 12:31 PM    [facility] [address], [place], [place]  [address] Phone: [phone]; Fax: [phone]

## 2014-07-11 ENCOUNTER — Ambulatory Visit (INDEPENDENT_AMBULATORY_CARE_PROVIDER_SITE_OTHER): Payer: Medicare Other | Admitting: Physician Assistant

## 2014-07-11 ENCOUNTER — Encounter: Payer: Self-pay | Admitting: Physician Assistant

## 2014-07-11 VITALS — BP 122/82 | HR 58 | Ht 72.0 in | Wt 235.8 lb

## 2014-07-11 DIAGNOSIS — I10 Essential (primary) hypertension: Secondary | ICD-10-CM

## 2014-07-11 DIAGNOSIS — Z01810 Encounter for preprocedural cardiovascular examination: Secondary | ICD-10-CM | POA: Diagnosis not present

## 2014-07-11 DIAGNOSIS — E785 Hyperlipidemia, unspecified: Secondary | ICD-10-CM

## 2014-07-11 DIAGNOSIS — I2510 Atherosclerotic heart disease of native coronary artery without angina pectoris: Secondary | ICD-10-CM

## 2014-07-11 DIAGNOSIS — I739 Peripheral vascular disease, unspecified: Secondary | ICD-10-CM

## 2014-07-11 NOTE — Patient Instructions (Addendum)
Medication Instructions:  Your physician recommends that you continue on your current medications as directed. Please refer to the Current Medication list given to you today.  Labwork: NONE  Testing/Procedures: NONE  Follow-Up: Your physician recommends that you schedule a follow-up appointment in: 6 months with Dr. Tamala Julian.  Any Other Special Instructions Will Be Listed Below (If Applicable).

## 2014-07-15 ENCOUNTER — Telehealth: Payer: Self-pay | Admitting: *Deleted

## 2014-07-15 NOTE — Telephone Encounter (Signed)
Pt walked in today with a surg clearance form for [facility]. Pt saw Brynda Rim. PA 07/11/14 though he did not have the surg clearance form 07/11/14. Form filled out today and faxed to [phone] ATT: Sherri

## 2014-07-17 ENCOUNTER — Other Ambulatory Visit: Payer: Self-pay | Admitting: Surgery

## 2014-07-21 ENCOUNTER — Ambulatory Visit: Payer: Self-pay | Admitting: Physician Assistant

## 2014-07-23 DIAGNOSIS — S83242A Other tear of medial meniscus, current injury, left knee, initial encounter: Secondary | ICD-10-CM | POA: Diagnosis not present

## 2014-07-23 DIAGNOSIS — M23322 Other meniscus derangements, posterior horn of medial meniscus, left knee: Secondary | ICD-10-CM | POA: Diagnosis not present

## 2014-07-23 DIAGNOSIS — M179 Osteoarthritis of knee, unspecified: Secondary | ICD-10-CM | POA: Diagnosis not present

## 2014-07-23 DIAGNOSIS — M94262 Chondromalacia, left knee: Secondary | ICD-10-CM | POA: Diagnosis not present

## 2014-07-31 ENCOUNTER — Other Ambulatory Visit: Payer: Self-pay | Admitting: Internal Medicine

## 2014-07-31 DIAGNOSIS — S83242D Other tear of medial meniscus, current injury, left knee, subsequent encounter: Secondary | ICD-10-CM | POA: Diagnosis not present

## 2014-08-14 ENCOUNTER — Other Ambulatory Visit: Payer: Self-pay | Admitting: Interventional Cardiology

## 2014-08-14 NOTE — Telephone Encounter (Signed)
This med was given to the patient by Dr Melvyn Novas. Ok to refill or defer to their office?

## 2014-08-21 DIAGNOSIS — S83242D Other tear of medial meniscus, current injury, left knee, subsequent encounter: Secondary | ICD-10-CM | POA: Diagnosis not present

## 2014-08-28 DIAGNOSIS — M545 Low back pain: Secondary | ICD-10-CM | POA: Diagnosis not present

## 2014-09-05 ENCOUNTER — Other Ambulatory Visit: Payer: Self-pay | Admitting: Internal Medicine

## 2014-09-16 ENCOUNTER — Encounter (HOSPITAL_COMMUNITY): Payer: Self-pay | Admitting: *Deleted

## 2014-09-16 DIAGNOSIS — M169 Osteoarthritis of hip, unspecified: Secondary | ICD-10-CM | POA: Diagnosis not present

## 2014-09-16 DIAGNOSIS — M4306 Spondylolysis, lumbar region: Secondary | ICD-10-CM | POA: Diagnosis not present

## 2014-09-18 DIAGNOSIS — M4306 Spondylolysis, lumbar region: Secondary | ICD-10-CM | POA: Diagnosis not present

## 2014-09-18 DIAGNOSIS — M169 Osteoarthritis of hip, unspecified: Secondary | ICD-10-CM | POA: Diagnosis not present

## 2014-09-22 ENCOUNTER — Other Ambulatory Visit: Payer: Self-pay | Admitting: Gastroenterology

## 2014-09-23 ENCOUNTER — Encounter (HOSPITAL_COMMUNITY)
Admission: RE | Disposition: A | Discharge status: Home / Self Care | Payer: Self-pay | Source: Ambulatory Visit | Attending: Gastroenterology

## 2014-09-23 ENCOUNTER — Ambulatory Visit (HOSPITAL_COMMUNITY)
Admission: RE | Admit: 2014-09-23 | Discharge: 2014-09-23 | Disposition: A | Discharge status: Home / Self Care | Payer: Medicare Other | Source: Ambulatory Visit | Attending: Gastroenterology | Admitting: Gastroenterology

## 2014-09-23 ENCOUNTER — Ambulatory Visit (HOSPITAL_COMMUNITY): Payer: Medicare Other | Admitting: Anesthesiology

## 2014-09-23 ENCOUNTER — Encounter (HOSPITAL_COMMUNITY): Payer: Self-pay

## 2014-09-23 DIAGNOSIS — Z1211 Encounter for screening for malignant neoplasm of colon: Secondary | ICD-10-CM | POA: Insufficient documentation

## 2014-09-23 DIAGNOSIS — Z5309 Procedure and treatment not carried out because of other contraindication: Secondary | ICD-10-CM | POA: Diagnosis not present

## 2014-09-23 HISTORY — DX: Gastro-esophageal reflux disease without esophagitis: K21.9

## 2014-09-23 HISTORY — DX: Spinal stenosis, site unspecified: M48.00

## 2014-09-23 HISTORY — DX: Benign prostatic hyperplasia without lower urinary tract symptoms: N40.0

## 2014-09-23 SURGERY — CANCELLED PROCEDURE

## 2014-09-23 MED ORDER — FENTANYL CITRATE (PF) 100 MCG/2ML IJ SOLN: 25.0000 ug | INTRAMUSCULAR | Status: DC | PRN

## 2014-09-23 MED ORDER — PROMETHAZINE HCL 25 MG/ML IJ SOLN: 6.2500 mg | INTRAMUSCULAR | Status: DC | PRN

## 2014-09-23 MED ORDER — PROPOFOL 10 MG/ML IV BOLUS
INTRAVENOUS | Status: AC
  Filled 2014-09-23: qty 20

## 2014-09-23 MED ORDER — SODIUM CHLORIDE 0.9 % IV SOLN: INTRAVENOUS | Status: DC

## 2014-09-23 MED ORDER — MEPERIDINE HCL 100 MG/ML IJ SOLN: 6.2500 mg | INTRAMUSCULAR | Status: DC | PRN

## 2014-09-23 MED ORDER — MIDAZOLAM HCL 2 MG/2ML IJ SOLN: 0.5000 mg | Freq: Once | INTRAMUSCULAR | Status: AC | PRN

## 2014-09-23 SURGICAL SUPPLY — 21 items

## 2014-09-23 NOTE — H&P (Signed)
  Procedure: Screening colonoscopy  Screening colonoscopy was canceled today. Patient did not stop taking Plavix prior to the procedure.

## 2014-09-23 NOTE — OR Nursing (Signed)
Patient did not follow instructions as to how long to be off of his plavix.  After council with Dr. Wynetta Emery, patient is to reschedule procedure.    Laverta Baltimore, RN

## 2014-09-23 NOTE — Anesthesia Preprocedure Evaluation (Addendum)
Anesthesia Evaluation  Patient identified by MRN, date of birth, ID band Patient awake    Reviewed: Allergy & Precautions, NPO status , Patient's Chart, lab work & pertinent test results  History of Anesthesia Complications Negative for: history of anesthetic complications  Airway Mallampati: I  TM Distance: >3 FB Neck ROM: Full    Dental  (+) Caps, Dental Advisory Given   Pulmonary sleep apnea and Continuous Positive Airway Pressure Ventilation , former smoker (quit 1989),  pulm fibrosis breath sounds clear to auscultation        Cardiovascular hypertension, Pt. on medications and Pt. on home beta blockers - angina+ CAD, + Cardiac Stents, + CABG and + Peripheral Vascular Disease Rhythm:Regular Rate:Normal  '12 cath: single vessel disease s/p CABG and stent, patent, EF 55%   Neuro/Psych Spinal stenosis: chronic back pain    GI/Hepatic Neg liver ROS, GERD-  Medicated and Controlled,  Endo/Other  Morbid obesity  Renal/GU negative Renal ROS     Musculoskeletal  (+) Arthritis -, Osteoarthritis,    Abdominal (+) + obese,   Peds  Hematology negative hematology ROS (+)   Anesthesia Other Findings   Reproductive/Obstetrics                           Anesthesia Physical Anesthesia Plan  ASA: III  Anesthesia Plan: MAC   Post-op Pain Management:    Induction:   Airway Management Planned: Natural Airway and Nasal Cannula  Additional Equipment:   Intra-op Plan:   Post-operative Plan:   Informed Consent: I have reviewed the patients History and Physical, chart, labs and discussed the procedure including the risks, benefits and alternatives for the proposed anesthesia with the patient or authorized representative who has indicated his/her understanding and acceptance.   Dental advisory given  Plan Discussed with: CRNA and Surgeon  Anesthesia Plan Comments: (Plan routine monitors, MAC)         Anesthesia Quick Evaluation

## 2014-10-09 DIAGNOSIS — M549 Dorsalgia, unspecified: Secondary | ICD-10-CM | POA: Diagnosis not present

## 2014-10-09 DIAGNOSIS — M47817 Spondylosis without myelopathy or radiculopathy, lumbosacral region: Secondary | ICD-10-CM | POA: Diagnosis not present

## 2014-10-09 DIAGNOSIS — M961 Postlaminectomy syndrome, not elsewhere classified: Secondary | ICD-10-CM | POA: Diagnosis not present

## 2014-10-09 DIAGNOSIS — M5136 Other intervertebral disc degeneration, lumbar region: Secondary | ICD-10-CM | POA: Diagnosis not present

## 2014-10-13 ENCOUNTER — Other Ambulatory Visit: Payer: Self-pay | Admitting: Orthopaedic Surgery

## 2014-10-14 ENCOUNTER — Other Ambulatory Visit: Payer: Self-pay | Admitting: Orthopaedic Surgery

## 2014-10-14 DIAGNOSIS — M5136 Other intervertebral disc degeneration, lumbar region: Secondary | ICD-10-CM

## 2014-10-17 DIAGNOSIS — L739 Follicular disorder, unspecified: Secondary | ICD-10-CM | POA: Diagnosis not present

## 2014-10-17 DIAGNOSIS — L638 Other alopecia areata: Secondary | ICD-10-CM | POA: Diagnosis not present

## 2014-10-21 ENCOUNTER — Ambulatory Visit
Admission: RE | Admit: 2014-10-21 | Discharge: 2014-10-21 | Disposition: A | Discharge status: Home / Self Care | Payer: Medicare Other | Source: Ambulatory Visit | Attending: Orthopaedic Surgery | Admitting: Orthopaedic Surgery

## 2014-10-21 DIAGNOSIS — M47816 Spondylosis without myelopathy or radiculopathy, lumbar region: Secondary | ICD-10-CM | POA: Diagnosis not present

## 2014-10-21 DIAGNOSIS — Z981 Arthrodesis status: Secondary | ICD-10-CM | POA: Diagnosis not present

## 2014-10-21 DIAGNOSIS — M5136 Other intervertebral disc degeneration, lumbar region: Secondary | ICD-10-CM

## 2014-10-21 DIAGNOSIS — M4806 Spinal stenosis, lumbar region: Secondary | ICD-10-CM | POA: Diagnosis not present

## 2014-10-31 DIAGNOSIS — M47817 Spondylosis without myelopathy or radiculopathy, lumbosacral region: Secondary | ICD-10-CM | POA: Diagnosis not present

## 2014-10-31 DIAGNOSIS — M5136 Other intervertebral disc degeneration, lumbar region: Secondary | ICD-10-CM | POA: Diagnosis not present

## 2014-10-31 DIAGNOSIS — M545 Low back pain: Secondary | ICD-10-CM | POA: Diagnosis not present

## 2014-10-31 DIAGNOSIS — M961 Postlaminectomy syndrome, not elsewhere classified: Secondary | ICD-10-CM | POA: Diagnosis not present

## 2014-11-17 DIAGNOSIS — M961 Postlaminectomy syndrome, not elsewhere classified: Secondary | ICD-10-CM | POA: Diagnosis not present

## 2014-11-17 DIAGNOSIS — M5136 Other intervertebral disc degeneration, lumbar region: Secondary | ICD-10-CM | POA: Diagnosis not present

## 2014-11-17 DIAGNOSIS — M5416 Radiculopathy, lumbar region: Secondary | ICD-10-CM | POA: Diagnosis not present

## 2014-12-01 ENCOUNTER — Other Ambulatory Visit: Payer: Self-pay | Admitting: Interventional Cardiology

## 2014-12-01 NOTE — Telephone Encounter (Signed)
I do not see a recent lipid panel and per last office visit, this is managed by pcp. Ok to refill or defer to their office? Please advise. Thanks, MI

## 2014-12-08 ENCOUNTER — Other Ambulatory Visit: Payer: Self-pay | Admitting: Interventional Cardiology

## 2014-12-08 DIAGNOSIS — M961 Postlaminectomy syndrome, not elsewhere classified: Secondary | ICD-10-CM | POA: Diagnosis not present

## 2014-12-08 DIAGNOSIS — M545 Low back pain: Secondary | ICD-10-CM | POA: Diagnosis not present

## 2014-12-08 DIAGNOSIS — M5126 Other intervertebral disc displacement, lumbar region: Secondary | ICD-10-CM | POA: Diagnosis not present

## 2014-12-11 ENCOUNTER — Encounter: Payer: Self-pay | Admitting: Family

## 2014-12-15 ENCOUNTER — Encounter: Payer: Self-pay | Admitting: Family

## 2014-12-15 ENCOUNTER — Ambulatory Visit (HOSPITAL_COMMUNITY)
Admission: RE | Admit: 2014-12-15 | Discharge: 2014-12-15 | Disposition: A | Discharge status: Home / Self Care | Payer: Medicare Other | Source: Ambulatory Visit | Attending: Family | Admitting: Family

## 2014-12-15 ENCOUNTER — Other Ambulatory Visit: Payer: Self-pay | Admitting: Surgery

## 2014-12-15 ENCOUNTER — Ambulatory Visit (INDEPENDENT_AMBULATORY_CARE_PROVIDER_SITE_OTHER)
Admission: RE | Admit: 2014-12-15 | Discharge: 2014-12-15 | Disposition: A | Discharge status: Home / Self Care | Payer: Medicare Other | Source: Ambulatory Visit | Attending: Surgery | Admitting: Surgery

## 2014-12-15 ENCOUNTER — Ambulatory Visit (INDEPENDENT_AMBULATORY_CARE_PROVIDER_SITE_OTHER): Payer: Medicare Other | Admitting: Family

## 2014-12-15 VITALS — BP 130/84 | HR 60 | Temp 98.2°F | Resp 14 | Ht 72.0 in | Wt 231.5 lb

## 2014-12-15 DIAGNOSIS — I779 Disorder of arteries and arterioles, unspecified: Secondary | ICD-10-CM | POA: Diagnosis not present

## 2014-12-15 DIAGNOSIS — I10 Essential (primary) hypertension: Secondary | ICD-10-CM | POA: Insufficient documentation

## 2014-12-15 DIAGNOSIS — I739 Peripheral vascular disease, unspecified: Secondary | ICD-10-CM

## 2014-12-15 DIAGNOSIS — I2510 Atherosclerotic heart disease of native coronary artery without angina pectoris: Secondary | ICD-10-CM | POA: Diagnosis not present

## 2014-12-15 DIAGNOSIS — E785 Hyperlipidemia, unspecified: Secondary | ICD-10-CM | POA: Insufficient documentation

## 2014-12-15 DIAGNOSIS — Z959 Presence of cardiac and vascular implant and graft, unspecified: Secondary | ICD-10-CM

## 2014-12-15 NOTE — Patient Instructions (Signed)
Peripheral Vascular Disease Peripheral vascular disease (PVD) is a disease of the blood vessels that are not part of your heart and brain. A simple term for PVD is poor circulation. In most cases, PVD narrows the blood vessels that carry blood from your heart to the rest of your body. This can result in a decreased supply of blood to your arms, legs, and internal organs, like your stomach or kidneys. However, it most often affects a person's lower legs and feet. There are two types of PVD.  Organic PVD. This is the more common type. It is caused by damage to the structure of blood vessels.  Functional PVD. This is caused by conditions that make blood vessels contract and tighten (spasm). Without treatment, PVD tends to get worse over time. PVD can also lead to acute ischemic limb. This is when an arm or limb suddenly has trouble getting enough blood. This is a medical emergency. CAUSES Each type of PVD has many different causes. The most common cause of PVD is buildup of a fatty material (plaque) inside of your arteries (atherosclerosis). Small amounts of plaque can break off from the walls of the blood vessels and become lodged in a smaller artery. This blocks blood flow and can cause acute ischemic limb. Other common causes of PVD include:  Blood clots that form inside of blood vessels.  Injuries to blood vessels.  Diseases that cause inflammation of blood vessels or cause blood vessel spasms.  Health behaviors and health history that increase your risk of developing PVD. RISK FACTORS  You may have a greater risk of PVD if you:  Have a family history of PVD.  Have certain medical conditions, including:  High cholesterol.  Diabetes.  High blood pressure (hypertension).  Coronary heart disease.  Past problems with blood clots.  Past injury, such as burns or a broken bone. These may have damaged blood vessels in your limbs.  Buerger disease. This is caused by inflamed blood  vessels in your hands and feet.  Some forms of arthritis.  Rare birth defects that affect the arteries in your legs.  Use tobacco.  Do not get enough exercise.  Are obese.  Are age 50 or older. SIGNS AND SYMPTOMS  PVD may cause many different symptoms. Your symptoms depend on what part of your body is not getting enough blood. Some common signs and symptoms include:  Cramps in your lower legs. This may be a symptom of poor leg circulation (claudication).  Pain and weakness in your legs while you are physically active that goes away when you rest (intermittent claudication).  Leg pain when at rest.  Leg numbness, tingling, or weakness.  Coldness in a leg or foot, especially when compared with the other leg.  Skin or hair changes. These can include:  Hair loss.  Shiny skin.  Pale or bluish skin.  Thick toenails.  Inability to get or maintain an erection (erectile dysfunction). People with PVD are more prone to developing ulcers and sores on their toes, feet, or legs. These may take longer than normal to heal. DIAGNOSIS Your health care provider may diagnose PVD from your signs and symptoms. The health care provider will also do a physical exam. You may have tests to find out what is causing your PVD and determine its severity. Tests may include:  Blood pressure recordings from your arms and legs and measurements of the strength of your pulses (pulse volume recordings).  Imaging studies using sound waves to take pictures of   the blood flow through your blood vessels (Doppler ultrasound).  Injecting a dye into your blood vessels before having imaging studies using:  X-rays (angiogram or arteriogram).  Computer-generated X-rays (CT angiogram).  A powerful electromagnetic field and a computer (magnetic resonance angiogram or MRA). TREATMENT Treatment for PVD depends on the cause of your condition and the severity of your symptoms. It also depends on your age. Underlying  causes need to be treated and controlled. These include long-lasting (chronic) conditions, such as diabetes, high cholesterol, and high blood pressure. You may need to first try making lifestyle changes and taking medicines. Surgery may be needed if these do not work. Lifestyle changes may include:  Quitting smoking.  Exercising regularly.  Following a low-fat, low-cholesterol diet. Medicines may include:  Blood thinners to prevent blood clots.  Medicines to improve blood flow.  Medicines to improve your blood cholesterol levels. Surgical procedures may include:  A procedure that uses an inflated balloon to open a blocked artery and improve blood flow (angioplasty).  A procedure to put in a tube (stent) to keep a blocked artery open (stent implant).  Surgery to reroute blood flow around a blocked artery (peripheral bypass surgery).  Surgery to remove dead tissue from an infected wound on the affected limb.  Amputation. This is surgical removal of the affected limb. This may be necessary in cases of acute ischemic limb that are not improved through medical or surgical treatments. HOME CARE INSTRUCTIONS  Take medicines only as directed by your health care provider.  Do not use any tobacco products, including cigarettes, chewing tobacco, or electronic cigarettes. If you need help quitting, ask your health care provider.  Lose weight if you are overweight, and maintain a healthy weight as directed by your health care provider.  Eat a diet that is low in fat and cholesterol. If you need help, ask your health care provider.  Exercise regularly. Ask your health care provider to suggest some good activities for you.  Use compression stockings or other mechanical devices as directed by your health care provider.  Take good care of your feet.  Wear comfortable shoes that fit well.  Check your feet often for any cuts or sores. SEEK MEDICAL CARE IF:  You have cramps in your legs  while walking.  You have leg pain when you are at rest.  You have coldness in a leg or foot.  Your skin changes.  You have erectile dysfunction.  You have cuts or sores on your feet that are not healing. SEEK IMMEDIATE MEDICAL CARE IF:  Your arm or leg turns cold and blue.  Your arms or legs become red, warm, swollen, painful, or numb.  You have chest pain or trouble breathing.  You suddenly have weakness in your face, arm, or leg.  You become very confused or lose the ability to speak.  You suddenly have a very bad headache or lose your vision.   This information is not intended to replace advice given to you by your health care provider. Make sure you discuss any questions you have with your health care provider.   Document Released: 03/24/2004 Document Revised: 03/07/2014 Document Reviewed: 07/25/2013 Elsevier Interactive Patient Education 2016 [employer]  

## 2014-12-15 NOTE — Addendum Note (Signed)
Addended by: Dorthula Rue L on: 12/15/2014 06:03 PM   Modules accepted: Orders

## 2014-12-15 NOTE — Progress Notes (Signed)
[facility] [place] HISTORY AND PHYSICAL -PAD  History of Present Illness Richard Parrish is a 64 y.o. male patient of Dr. Trula Slade who is s/p left SFA and politeal atherectomy and stent placement on 04/13/11 for left calf claudication. He has had total resolution of left calf claudication and has no other claudication symptoms, denies non healing wounds. He denies any history of stroke or TIA. He has several cardiac stents and CABG in 1989 of the right RCA.  The patient reports New Medical or Surgical History: back and hip issues that are being evaluated by Dr. Ennis Forts in [place].  Pt Diabetic: No Pt smoker: former smoker, quit in 1989  Pt meds include: Statin :Yes Betablocker: Yes ASA: Yes Other anticoagulants/antiplatelets: Plavix  Past Medical History  Diagnosis Date  . Coronary artery disease 1989    cabg with dvg to rca   . Depression   . Osteoarthritis     hip  . CAD (coronary artery disease)   . Hypertension   . Hyperlipidemia   . Anxiety   . Insomnia   . Pulmonary fibrosis ([place])   . Leg pain   . Peripheral vascular disease ([place])   . BPH (benign prostatic hypertrophy)   . GERD (gastroesophageal reflux disease)   . Spinal stenosis   . Sleep apnea     cpap setting of 60 per pt    Social History Social History  Substance Use Topics  . Smoking status: Former Smoker -- 2.00 packs/day for 15 years    Types: Cigarettes    Quit date: 03/01/1987  . Smokeless tobacco: Never Used  . Alcohol Use: Yes     Comment: weekly    Family History Family History  Problem Relation Age of Onset  . Heart disease Mother   . Cancer Father     Past Surgical History  Procedure Laterality Date  . Rca stent    . Rca and rca stents  04/12/11    Left   . Abdominal surgery  04/12/11    Abdominal Angiogram  . Abdominal aortagram N/A 03/15/2011    Procedure: ABDOMINAL Maxcine Ham;  Surgeon: Serafina Mitchell, MD;  Location: [facility] C[facility] LAB;  Service:  Cardiovascular;  Laterality: N/A;  . Abdominal aortagram N/A 04/12/2011    Procedure: ABDOMINAL Maxcine Ham;  Surgeon: Serafina Mitchell, MD;  Location: [facility] CATH LAB;  Service: Cardiovascular;  Laterality: N/A;  . Coronary artery bypass graft      x 1  . Back surgery      lumbar fusion, S L 5 to L 4  . Joint replacement      right hip  . Rotator cuff surgery Right 2006    Allergies  Allergen Reactions  . Ambien [Zolpidem Tartrate] Other (See Comments)    Bad dreams  . Zolpidem Tartrate Other (See Comments)    Vivid dreams    Current Outpatient Prescriptions  Medication Sig Dispense Refill  . ALPRAZolam (XANAX) 0.5 MG tablet Take 0.5 mg by mouth at bedtime as needed for sleep.     Marland Kitchen aspirin 81 MG tablet Take 81 mg by mouth every morning.     Marland Kitchen atorvastatin (LIPITOR) 40 MG tablet TAKE 1 TABLET (40 MG TOTAL) BY MOUTH DAILY. 90 tablet 1  . Calcium Carbonate-Vitamin D (CALCIUM + D PO) Take 1 tablet by mouth every morning.     . clopidogrel (PLAVIX) 75 MG tablet TAKE 1 TABLET BY MOUTH EVERY DAY 90 tablet 2  . ezetimibe (ZETIA) 10  MG tablet Take 10 mg by mouth daily.    Marland Kitchen gabapentin (NEURONTIN) 300 MG capsule Take 300 mg by mouth 3 (three) times daily.    . hyoscyamine (LEVSIN, ANASPAZ) 0.125 MG tablet Take 0.125 mg by mouth every 4 (four) hours as needed.    Marland Kitchen ibuprofen (ADVIL,MOTRIN) 200 MG tablet Take 800 mg by mouth 3 (three) times daily.     . methocarbamol (ROBAXIN) 500 MG tablet Take 1-2 tablets by mouth 2 (two) times daily.   1  . metoprolol succinate (TOPROL-XL) 50 MG 24 hr tablet TAKE 1 TABLET DAILY (NEED APPOINTMENT) 90 tablet 1  . Multiple Vitamin (MULTIVITAMIN) tablet Take 1 tablet by mouth every morning.     . nitroGLYCERIN (NITROSTAT) 0.4 MG SL tablet Place 0.4 mg under the tongue every 5 (five) minutes as needed for chest pain.    . Omega-3 Fatty Acids (FISH OIL) 1000 MG CAPS Take 1,000 mg by mouth every morning.     . tamsulosin (FLOMAX) 0.4 MG CAPS capsule Take 1 capsule by  mouth every morning.  5  . tiZANidine (ZANAFLEX) 4 MG capsule Take 4 mg by mouth 2 (two) times daily.    . traMADol (ULTRAM) 50 MG tablet Take 100 mg by mouth 3 (three) times daily.     . valsartan (DIOVAN) 320 MG tablet TAKE 1 TABLET BY MOUTH EVERY DAY 30 tablet 5   No current facility-administered medications for this visit.    ROS: See HPI for pertinent positives and negatives.   Physical Examination  Filed Vitals:   12/15/14 1104  BP: 130/84  Pulse: 60  Temp: 98.2 F (36.8 C)  TempSrc: Oral  Resp: 14  Height: 6' (1.829 m)  Weight: 231 lb 8 oz (105.008 kg)  SpO2: 99%   Body mass index is 31.39 kg/(m^2).  General: A&O x 3,  WDWN. Gait: normal Eyes: PERRLA. Pulmonary: CTAB, without wheezes , rales or rhonchi. Cardiac: regular rythm, no detected murmur.         Carotid Bruits Right Left   Negative Negative  Aorta is not palpable. Radial pulses: 2+ palpable and =                           VASCULAR EXAM: Extremities without ischemic changes, without Gangrene; without open wounds.                                                                                                          LE Pulses Right Left       FEMORAL  1+ palpable  1+ palpable        POPLITEAL  not palpable   not palpable       POSTERIOR TIBIAL  not palpable   not palpable        DORSALIS PEDIS      ANTERIOR TIBIAL 2+ palpable  2+ palpable    Abdomen: soft, NT, no palpable masses. Skin: no rashes, no ulcers. Musculoskeletal: no muscle wasting or atrophy.  Neurologic: A&O X 3; Appropriate  Affect ; SENSATION: normal; MOTOR FUNCTION:  moving all extremities equally, motor strength 5/5 throughout. Speech is fluent/normal. CN 2-12 intact.    Non-Invasive Vascular Imaging: DATE: 12/15/2014 ABI: RIGHT: 1.11 (1.12, 12/09/13, Waveforms: triphasic, TBI: 0.76;  LEFT: 1.12 (1.13), Waveforms: triphasic DUPLEX SCAN OF LEFT LE stent: Patent left SFA stent, no evidence of restenosis; no significant  change compared to 12/09/13 exam.   ASSESSMENT: Richard Parrish is a 64 y.o. male who is s/p left SFA and politeal atherectomy and stent placement on 04/13/11 for left calf claudication. Today's left LE arterial duplex suggests a patent left SFA stent, no evidence of restenosis; no significant change compared to 12/09/13 exam. ABI's and TBI's  remain normal with all triphasic waveforms.   He has no claudication since the left SFA stent placement. His walking is not really limited as he stays active, but he has lumbar spine and hip pain which is under evaluation. He stopped smoking in 1989 and does not have DM.  PLAN:  Based on the patient's vascular studies and examination, pt will return to clinic in 1 year with ABI's and left LE arterial duplex.  I discussed in depth with the patient the nature of atherosclerosis, and emphasized the importance of maximal medical management including strict control of blood pressure, blood glucose, and lipid levels, obtaining regular exercise, and continued cessation of smoking.  The patient is aware that without maximal medical management the underlying atherosclerotic disease process will progress, limiting the benefit of any interventions.  The patient was given information about PAD including signs, symptoms, treatment, what symptoms should prompt the patient to seek immediate medical care, and risk reduction measures to take.  Clemon Chambers, RN, MSN, FNP-C [facility] Phone: [phone]  Clinic MD: Trula Slade  12/15/2014 11:20 AM

## 2014-12-24 DIAGNOSIS — M545 Low back pain: Secondary | ICD-10-CM | POA: Diagnosis not present

## 2014-12-24 DIAGNOSIS — I739 Peripheral vascular disease, unspecified: Secondary | ICD-10-CM | POA: Diagnosis not present

## 2014-12-24 DIAGNOSIS — Z23 Encounter for immunization: Secondary | ICD-10-CM | POA: Diagnosis not present

## 2014-12-24 DIAGNOSIS — F329 Major depressive disorder, single episode, unspecified: Secondary | ICD-10-CM | POA: Diagnosis not present

## 2014-12-24 DIAGNOSIS — G4733 Obstructive sleep apnea (adult) (pediatric): Secondary | ICD-10-CM | POA: Diagnosis not present

## 2014-12-24 DIAGNOSIS — E782 Mixed hyperlipidemia: Secondary | ICD-10-CM | POA: Diagnosis not present

## 2014-12-24 DIAGNOSIS — I10 Essential (primary) hypertension: Secondary | ICD-10-CM | POA: Diagnosis not present

## 2014-12-24 DIAGNOSIS — M79646 Pain in unspecified finger(s): Secondary | ICD-10-CM | POA: Diagnosis not present

## 2014-12-24 DIAGNOSIS — I2510 Atherosclerotic heart disease of native coronary artery without angina pectoris: Secondary | ICD-10-CM | POA: Diagnosis not present

## 2014-12-24 DIAGNOSIS — G894 Chronic pain syndrome: Secondary | ICD-10-CM | POA: Diagnosis not present

## 2014-12-31 DIAGNOSIS — M25551 Pain in right hip: Secondary | ICD-10-CM | POA: Diagnosis not present

## 2015-01-09 DIAGNOSIS — M25551 Pain in right hip: Secondary | ICD-10-CM | POA: Diagnosis not present

## 2015-01-09 DIAGNOSIS — Z96641 Presence of right artificial hip joint: Secondary | ICD-10-CM | POA: Diagnosis not present

## 2015-01-13 DIAGNOSIS — M25551 Pain in right hip: Secondary | ICD-10-CM | POA: Diagnosis not present

## 2015-01-15 DIAGNOSIS — M47817 Spondylosis without myelopathy or radiculopathy, lumbosacral region: Secondary | ICD-10-CM | POA: Diagnosis not present

## 2015-01-15 DIAGNOSIS — M791 Myalgia: Secondary | ICD-10-CM | POA: Diagnosis not present

## 2015-01-15 DIAGNOSIS — M5136 Other intervertebral disc degeneration, lumbar region: Secondary | ICD-10-CM | POA: Diagnosis not present

## 2015-01-21 ENCOUNTER — Encounter: Payer: Self-pay | Admitting: *Deleted

## 2015-01-26 DIAGNOSIS — M25551 Pain in right hip: Secondary | ICD-10-CM | POA: Diagnosis not present

## 2015-01-26 DIAGNOSIS — M5136 Other intervertebral disc degeneration, lumbar region: Secondary | ICD-10-CM | POA: Diagnosis not present

## 2015-01-26 DIAGNOSIS — M545 Low back pain: Secondary | ICD-10-CM | POA: Diagnosis not present

## 2015-01-27 ENCOUNTER — Encounter: Payer: Self-pay | Admitting: Interventional Cardiology

## 2015-01-27 ENCOUNTER — Ambulatory Visit (INDEPENDENT_AMBULATORY_CARE_PROVIDER_SITE_OTHER): Payer: Medicare Other | Admitting: Interventional Cardiology

## 2015-01-27 VITALS — BP 126/84 | HR 61 | Ht 72.0 in | Wt 236.0 lb

## 2015-01-27 DIAGNOSIS — I2510 Atherosclerotic heart disease of native coronary artery without angina pectoris: Secondary | ICD-10-CM

## 2015-01-27 DIAGNOSIS — I779 Disorder of arteries and arterioles, unspecified: Secondary | ICD-10-CM | POA: Diagnosis not present

## 2015-01-27 DIAGNOSIS — I10 Essential (primary) hypertension: Secondary | ICD-10-CM | POA: Diagnosis not present

## 2015-01-27 DIAGNOSIS — E785 Hyperlipidemia, unspecified: Secondary | ICD-10-CM

## 2015-01-27 DIAGNOSIS — I739 Peripheral vascular disease, unspecified: Secondary | ICD-10-CM | POA: Diagnosis not present

## 2015-01-27 NOTE — Patient Instructions (Signed)
Medication Instructions:  Your physician recommends that you continue on your current medications as directed. Please refer to the Current Medication list given to you today.  Please reduce the amount of Ibuprofen to lowest tolerable dose, because of potential cardiac side effects  Labwork: None ordered  Testing/Procedures: None ordered  Follow-Up: Your physician wants you to follow-up in: 6-12 months You will receive a reminder letter in the mail two months in advance. If you don't receive a letter, please call our office to schedule the follow-up appointment.   Any Other Special Instructions Will Be Listed Below (If Applicable).     If you need a refill on your cardiac medications before your next appointment, please call your pharmacy.

## 2015-01-27 NOTE — Progress Notes (Signed)
Cardiology Office Note   Date:  01/27/2015   ID:  TUCKER MINTER, DOB 05/10/1950, MRN [id number]  PCP:  Kandice Hams, MD  Cardiologist:  Sinclair Grooms, MD   Chief Complaint  Patient presents with  . Coronary Artery Disease      History of Present Illness: Richard Parrish is a 64 y.o. male who presents for  CAD, prior bypass graft to RCA for failed angioplasty , patent but diffusely diseased SVG to RCA document by catheterization 2012 , severe degenerative disc disease, depression, abdominal aortic aneurysm ,  PAD with prior left superficial femoral and popliteal atherectomy in-stent, and hypertension.   Patient has no cardiopulmonary complaints. He has been limited with reference to aerobic activity because of lumbar disc disease. He is gaining weight since last office visit. He denies angina. He has not needed nitroglycerin. There is some dyspnea on exertion which she believes is related to deconditioning.    Past Medical History  Diagnosis Date  . Coronary artery disease 1989    cabg with dvg to rca   . Depression   . Osteoarthritis     hip  . CAD (coronary artery disease)   . Hypertension   . Hyperlipidemia   . Anxiety   . Insomnia   . Pulmonary fibrosis ([place])   . Leg pain   . Peripheral vascular disease ([place])   . BPH (benign prostatic hypertrophy)   . GERD (gastroesophageal reflux disease)   . Spinal stenosis   . Sleep apnea     cpap setting of 60 per pt    Past Surgical History  Procedure Laterality Date  . Rca stent    . Rca and rca stents  04/12/11    Left   . Abdominal surgery  04/12/11    Abdominal Angiogram  . Abdominal aortagram N/A 03/15/2011    Procedure: ABDOMINAL Maxcine Ham;  Surgeon: Serafina Mitchell, MD;  Location: [facility] CATH LAB;  Service: Cardiovascular;  Laterality: N/A;  . Abdominal aortagram N/A 04/12/2011    Procedure: ABDOMINAL Maxcine Ham;  Surgeon: Serafina Mitchell, MD;  Location: [facility] CATH LAB;  Service: Cardiovascular;  Laterality:  N/A;  . Coronary artery bypass graft      x 1  . Back surgery      lumbar fusion, S L 5 to L 4  . Joint replacement      right hip  . Rotator cuff surgery Right 2006     Current Outpatient Prescriptions  Medication Sig Dispense Refill  . ALPRAZolam (XANAX) 0.5 MG tablet Take 0.5 mg by mouth at bedtime as needed for sleep.     Marland Kitchen aspirin 81 MG tablet Take 81 mg by mouth every morning.     Marland Kitchen atorvastatin (LIPITOR) 40 MG tablet TAKE 1 TABLET (40 MG TOTAL) BY MOUTH DAILY. 90 tablet 1  . Calcium Carbonate-Vitamin D (CALCIUM + D PO) Take 1 tablet by mouth every morning.     . clopidogrel (PLAVIX) 75 MG tablet TAKE 1 TABLET BY MOUTH EVERY DAY 90 tablet 2  . gabapentin (NEURONTIN) 300 MG capsule Take 600 mg by mouth 3 (three) times daily.     . hyoscyamine (LEVSIN, ANASPAZ) 0.125 MG tablet Take 0.125 mg by mouth every 4 (four) hours as needed for cramping.     Marland Kitchen ibuprofen (ADVIL,MOTRIN) 200 MG tablet Take 800 mg by mouth 3 (three) times daily.     . methocarbamol (ROBAXIN) 500 MG tablet Take 1,000 mg by mouth 3 (three) times  daily.    . metoprolol succinate (TOPROL-XL) 50 MG 24 hr tablet TAKE 1 TABLET DAILY (NEED APPOINTMENT) 90 tablet 1  . Multiple Vitamin (MULTIVITAMIN) tablet Take 1 tablet by mouth every morning.     . nitroGLYCERIN (NITROSTAT) 0.4 MG SL tablet Place 0.4 mg under the tongue every 5 (five) minutes as needed for chest pain.    . Omega-3 Fatty Acids (FISH OIL) 1000 MG CAPS Take 1,000 mg by mouth every morning.     Marland Kitchen PARoxetine (PAXIL) 40 MG tablet Take 40 mg by mouth daily.  6  . tamsulosin (FLOMAX) 0.4 MG CAPS capsule Take 1 capsule by mouth every morning.  5  . traMADol (ULTRAM) 50 MG tablet Take 100 mg by mouth 3 (three) times daily.     . valsartan (DIOVAN) 320 MG tablet TAKE 1 TABLET BY MOUTH EVERY DAY 30 tablet 5   No current facility-administered medications for this visit.    Allergies:   Ambien and Zolpidem tartrate    Social History:  The patient  reports that  he quit smoking about 27 years ago. His smoking use included Cigarettes. He has a 30 pack-year smoking history. He has never used smokeless tobacco. He reports that he drinks alcohol. He reports that he does not use illicit drugs.   Family History:  The patient's family history includes Cancer in his father; Heart disease in his mother.    ROS:  Please see the history of present illness.   Otherwise, review of systems are positive for  Back pain, muscle discomfort in legs, depression, easy bruising , anxiety, and joint discomfort..   All other systems are reviewed and negative.    PHYSICAL EXAM: VS:  BP 126/84 mmHg  Pulse 61  Ht 6' (1.829 m)  Wt 236 lb (107.049 kg)  BMI 32.00 kg/m2  SpO2 98% , BMI Body mass index is 32 kg/(m^2). GEN: Well nourished, well developed, in no acute distress HEENT: normal Neck: no JVD, carotid bruits, or masses Cardiac: RRR.  There is no murmur, rub, or gallop. There is no edema. Respiratory:  clear to auscultation bilaterally, normal work of breathing. GI: soft, nontender, nondistended, + BS MS: no deformity or atrophy Skin: warm and dry, no rash Neuro:  Strength and sensation are intact Psych: euthymic mood, full affect   EKG:  EKG is not  ordered today.    Recent Labs: No results found for requested labs within last 365 days.    Lipid Panel No results found for: CHOL, TRIG, HDL, CHOLHDL, VLDL, LDLCALC, LDLDIRECT    Wt Readings from Last 3 Encounters:  01/27/15 236 lb (107.049 kg)  12/15/14 231 lb 8 oz (105.008 kg)  07/11/14 235 lb 12.8 oz (106.958 kg)      Other studies Reviewed: Additional studies/ records that were reviewed today include:  Reviewed prior cath report from 2012. The findings include  Widely patent left coronary system , totally occluded native right coronary, diffusely diseased but widely patent SVG to RCA..    ASSESSMENT AND PLAN:  1. Coronary artery disease involving native coronary artery of native heart without  angina pectoris  asymptomatic  2. Hyperlipidemia  on therapy and followed by primary care  3. Essential hypertension  controlled  4. Peripheral vascular disease ([place])  denies claudication    Current medicines are reviewed at length with the patient today.  The patient has the following concerns regarding medicines:  We discussed the negative impact of high dose nonsteroidal anti-inflammatory therapy..  The  following changes/actions have been instituted:     Decrease nonsteroidal anti-inflammatory intensity to the lowest dose possible to avoid thrombotic complications. Aerobic activity   Clinical follow-up in 6-12 months  Labs/ tests ordered today include:  No orders of the defined types were placed in this encounter.     Disposition:   FU with HS in 8 months  Signed, Sinclair Grooms, MD  01/27/2015 5:29 PM    [facility] [place], [place], [place]  [address] Phone: [phone]; Fax: [phone]

## 2015-01-28 DIAGNOSIS — H43812 Vitreous degeneration, left eye: Secondary | ICD-10-CM | POA: Diagnosis not present

## 2015-01-28 DIAGNOSIS — H2513 Age-related nuclear cataract, bilateral: Secondary | ICD-10-CM | POA: Diagnosis not present

## 2015-01-30 ENCOUNTER — Other Ambulatory Visit: Payer: Self-pay | Admitting: Interventional Cardiology

## 2015-02-09 DIAGNOSIS — M5136 Other intervertebral disc degeneration, lumbar region: Secondary | ICD-10-CM | POA: Diagnosis not present

## 2015-02-12 DIAGNOSIS — M47817 Spondylosis without myelopathy or radiculopathy, lumbosacral region: Secondary | ICD-10-CM | POA: Diagnosis not present

## 2015-02-12 DIAGNOSIS — M791 Myalgia: Secondary | ICD-10-CM | POA: Diagnosis not present

## 2015-02-26 DIAGNOSIS — M5136 Other intervertebral disc degeneration, lumbar region: Secondary | ICD-10-CM | POA: Diagnosis not present

## 2015-03-02 ENCOUNTER — Other Ambulatory Visit: Payer: Self-pay | Admitting: Surgery

## 2015-03-04 DIAGNOSIS — M5136 Other intervertebral disc degeneration, lumbar region: Secondary | ICD-10-CM | POA: Diagnosis not present

## 2015-03-11 DIAGNOSIS — M5136 Other intervertebral disc degeneration, lumbar region: Secondary | ICD-10-CM | POA: Diagnosis not present

## 2015-04-08 DIAGNOSIS — M5136 Other intervertebral disc degeneration, lumbar region: Secondary | ICD-10-CM | POA: Diagnosis not present

## 2015-05-18 DIAGNOSIS — M5136 Other intervertebral disc degeneration, lumbar region: Secondary | ICD-10-CM | POA: Diagnosis not present

## 2015-05-21 DIAGNOSIS — M5136 Other intervertebral disc degeneration, lumbar region: Secondary | ICD-10-CM | POA: Diagnosis not present

## 2015-05-21 DIAGNOSIS — M545 Low back pain: Secondary | ICD-10-CM | POA: Diagnosis not present

## 2015-05-21 DIAGNOSIS — M791 Myalgia: Secondary | ICD-10-CM | POA: Diagnosis not present

## 2015-05-30 ENCOUNTER — Other Ambulatory Visit: Payer: Self-pay | Admitting: Interventional Cardiology

## 2015-06-18 DIAGNOSIS — M961 Postlaminectomy syndrome, not elsewhere classified: Secondary | ICD-10-CM | POA: Diagnosis not present

## 2015-06-18 DIAGNOSIS — M5136 Other intervertebral disc degeneration, lumbar region: Secondary | ICD-10-CM | POA: Diagnosis not present

## 2015-06-18 DIAGNOSIS — M791 Myalgia: Secondary | ICD-10-CM | POA: Diagnosis not present

## 2015-06-18 DIAGNOSIS — M47817 Spondylosis without myelopathy or radiculopathy, lumbosacral region: Secondary | ICD-10-CM | POA: Diagnosis not present

## 2015-07-01 DIAGNOSIS — F4489 Other dissociative and conversion disorders: Secondary | ICD-10-CM | POA: Diagnosis not present

## 2015-07-01 DIAGNOSIS — I2510 Atherosclerotic heart disease of native coronary artery without angina pectoris: Secondary | ICD-10-CM | POA: Diagnosis not present

## 2015-07-01 DIAGNOSIS — Z0000 Encounter for general adult medical examination without abnormal findings: Secondary | ICD-10-CM | POA: Diagnosis not present

## 2015-07-01 DIAGNOSIS — G4733 Obstructive sleep apnea (adult) (pediatric): Secondary | ICD-10-CM | POA: Diagnosis not present

## 2015-07-01 DIAGNOSIS — Z125 Encounter for screening for malignant neoplasm of prostate: Secondary | ICD-10-CM | POA: Diagnosis not present

## 2015-07-01 DIAGNOSIS — I10 Essential (primary) hypertension: Secondary | ICD-10-CM | POA: Diagnosis not present

## 2015-07-01 DIAGNOSIS — Z1389 Encounter for screening for other disorder: Secondary | ICD-10-CM | POA: Diagnosis not present

## 2015-07-01 DIAGNOSIS — F329 Major depressive disorder, single episode, unspecified: Secondary | ICD-10-CM | POA: Diagnosis not present

## 2015-07-01 DIAGNOSIS — E7800 Pure hypercholesterolemia, unspecified: Secondary | ICD-10-CM | POA: Diagnosis not present

## 2015-07-01 DIAGNOSIS — G894 Chronic pain syndrome: Secondary | ICD-10-CM | POA: Diagnosis not present

## 2015-07-01 DIAGNOSIS — I739 Peripheral vascular disease, unspecified: Secondary | ICD-10-CM | POA: Diagnosis not present

## 2015-07-23 DIAGNOSIS — H04123 Dry eye syndrome of bilateral lacrimal glands: Secondary | ICD-10-CM | POA: Diagnosis not present

## 2015-07-23 DIAGNOSIS — H43812 Vitreous degeneration, left eye: Secondary | ICD-10-CM | POA: Diagnosis not present

## 2015-07-23 DIAGNOSIS — H2513 Age-related nuclear cataract, bilateral: Secondary | ICD-10-CM | POA: Diagnosis not present

## 2015-07-23 DIAGNOSIS — H524 Presbyopia: Secondary | ICD-10-CM | POA: Diagnosis not present

## 2015-08-10 ENCOUNTER — Encounter: Payer: Self-pay | Admitting: Sports Medicine

## 2015-08-10 ENCOUNTER — Ambulatory Visit (INDEPENDENT_AMBULATORY_CARE_PROVIDER_SITE_OTHER): Payer: Medicare Other | Admitting: Sports Medicine

## 2015-08-10 VITALS — BP 110/72 | Ht >= 80 in | Wt 225.0 lb

## 2015-08-10 DIAGNOSIS — M67879 Other specified disorders of synovium and tendon, unspecified ankle and foot: Secondary | ICD-10-CM | POA: Diagnosis not present

## 2015-08-10 DIAGNOSIS — M7660 Achilles tendinitis, unspecified leg: Secondary | ICD-10-CM

## 2015-08-10 MED ORDER — NITROGLYCERIN 0.2 MG/HR TD PT24: MEDICATED_PATCH | TRANSDERMAL | Status: DC

## 2015-08-10 NOTE — Progress Notes (Signed)
Patient ID: Richard Parrish, male   DOB: 1950/07/14, 65 y.o.   MRN: [id number]  CC: L achilles pain  HPI: Richard Parrish is a 65 yo male with PMH of CAD, PVD, HTN presenting with 2-3 months of L achilles tendon pain.  Pt states several months ago, when he was playing some pick-up basketball and started incorporating heel lifts into his workouts, he started having L achilles pain.  Pain is an ache at the top of the achilles (points to muscle tendon junction), worse with use, worsening over past several months.  Will occasionally swell and subside, ice and naproxen have helped, but still a nagging pain that keeps him from doing/enjoying his normal activities.  No history of injury to the site.    ROS: Gen: - fevers/chills MSK: + longstanding back, hip, and knee pain, otherwise - except as per HPI Neuro: no change in gait  PE: BP 110/72 mmHg  Ht '6\' 8"'$  (2.032 m)  Wt 225 lb (102.059 kg)  BMI 24.72 kg/m2 Gen: 65 yo male sitting one exam table in NAD HEENT: normocephalic, atraumatic Pulm: normal work of breathing MSK:  LLE: inspection of achilles tendon reveals mild swelling at the musculotendinous junction compared to contralateral side, mild TTP over musculotendinous junction, full active ROM, strength 5/5 throughout and sensation grossly intact Gait: normal gait   Imaging:  MSK-US performed: achilles tendon visualized in longitudinal and transverse plains, R achilles tendon of normal appearance and thickness, L achilles tendon thickened proximally, measuring 0.83cm at largest point, tendon also containing focal hypoechoic signal in posterior segment. Findings consistent with Achilles tendinopathy and partial tearing  A/P: Richard Parrish is a 65 yo male with PMH per above presenting with L Achilles tendinopathy vs partial tear.  L Achilles tendinopathy/partial tear Diagnosed based on focal exam findings and [place] images -- Will provide with strengthening exercise handout (heel drop  exercises) -- Bilateral heel lifts to take tension off of achilles tendon -- Topical NO patch for healing (quarter patch daily) -- Ice after activity PRN  Follow-up: See in four weeks for re-eval and re-US L achilles tendon  Richard Parrish, MS4   Patient seen and evaluated with the medical student. And agree with the above plan of care. Patient has classic findings of Achilles tendinopathy and partial tearing at the musculotendinous junction. He will start an Alfredson's heel drop exercise program along with topical nitroglycerin and heel lifts. He is to avoid any sort of sprinting or jumping and will follow-up with me in 4 weeks for reevaluation and repeat ultrasound.

## 2015-08-13 DIAGNOSIS — M47817 Spondylosis without myelopathy or radiculopathy, lumbosacral region: Secondary | ICD-10-CM | POA: Diagnosis not present

## 2015-08-13 DIAGNOSIS — M5136 Other intervertebral disc degeneration, lumbar region: Secondary | ICD-10-CM | POA: Diagnosis not present

## 2015-08-13 DIAGNOSIS — M791 Myalgia: Secondary | ICD-10-CM | POA: Diagnosis not present

## 2015-08-25 DIAGNOSIS — M5136 Other intervertebral disc degeneration, lumbar region: Secondary | ICD-10-CM | POA: Diagnosis not present

## 2015-09-07 ENCOUNTER — Encounter: Payer: Self-pay | Admitting: Sports Medicine

## 2015-09-07 ENCOUNTER — Ambulatory Visit (INDEPENDENT_AMBULATORY_CARE_PROVIDER_SITE_OTHER): Payer: Medicare Other | Admitting: Sports Medicine

## 2015-09-07 VITALS — BP 73/53 | HR 70 | Ht 72.0 in | Wt 224.0 lb

## 2015-09-07 DIAGNOSIS — M67879 Other specified disorders of synovium and tendon, unspecified ankle and foot: Secondary | ICD-10-CM

## 2015-09-07 DIAGNOSIS — M7660 Achilles tendinitis, unspecified leg: Secondary | ICD-10-CM

## 2015-09-07 NOTE — Progress Notes (Signed)
   Subjective:    Patient ID: Richard Parrish, male    DOB: [birth year], 65 y.o.   MRN: [id number]  HPI  L achilles tendon pain - [place] at last visit showed some signs of tendonopathy/tear - Patient reports compliance with HEP and stretched - Also icing and using NO patches (1/2 patch). No side effects - pain is a little better  - did golf 3 days ago and feels it is flared up again - some swelling over achilles on Left - Using naproxen and robaxin for back pain, helps some - stays active, pain does not limit him  Review of Systems No weakness, N/T Some swelling over L achilles tendon    Objective:   Physical Exam  L achilles with mild swelling about 3cm proximal to insertion on calcaneus.  Maximal tenderness over swollen area.  Dorsiflexion to 10 degrees non weight bearing with no pain.  Patient demonstrated home exercises and did have significant pain with eccentric dorsiflexion.  [place] of L achilles showed similar hypoechoic irregularity with thickening of his tendon.  Also noted some calcaneal apophyseal spurs, but non tender on exam.       Assessment & Plan:  Achilles Tendonopathy - Patient somewhat improved and less tender on exam - He is functioning well and able to perform golf swing without pain, but worse pain after full day of walking - Recommend avoidance of high impact activities and sprinting - Continue NO patches and HEP - Use heel lifts when able - NSAID and ice prn - Plan to f/u in 4 wks to monitor progress. Will repeat US again at f/u.

## 2015-09-18 ENCOUNTER — Encounter: Payer: Self-pay | Admitting: Interventional Cardiology

## 2015-09-18 ENCOUNTER — Ambulatory Visit (INDEPENDENT_AMBULATORY_CARE_PROVIDER_SITE_OTHER): Payer: Medicare Other | Admitting: Interventional Cardiology

## 2015-09-18 VITALS — BP 114/78 | HR 60 | Ht 72.0 in | Wt 225.4 lb

## 2015-09-18 DIAGNOSIS — E785 Hyperlipidemia, unspecified: Secondary | ICD-10-CM

## 2015-09-18 DIAGNOSIS — I10 Essential (primary) hypertension: Secondary | ICD-10-CM | POA: Diagnosis not present

## 2015-09-18 DIAGNOSIS — I739 Peripheral vascular disease, unspecified: Secondary | ICD-10-CM

## 2015-09-18 DIAGNOSIS — I2510 Atherosclerotic heart disease of native coronary artery without angina pectoris: Secondary | ICD-10-CM

## 2015-09-18 LAB — LIPID PANEL
CHOLESTEROL: 145 mg/dL (ref 125–200)
HDL: 53 mg/dL (ref >=40)
LDL Cholesterol: 82 mg/dL (ref <130)
Total CHOL/HDL Ratio: 2.7 Ratio (ref <=5.0)
Triglycerides: 52 mg/dL (ref <150)
VLDL: 10 mg/dL (ref <30)

## 2015-09-18 LAB — HEPATIC FUNCTION PANEL
ALT: 25 U/L (ref 9–46)
AST: 28 U/L (ref 10–35)
Albumin: 4.4 g/dL (ref 3.6–5.1)
Alkaline Phosphatase: 68 U/L (ref 40–115)
BILIRUBIN DIRECT: 0.2 mg/dL (ref <=0.2)
BILIRUBIN INDIRECT: 0.8 mg/dL (ref 0.2–1.2)
Total Bilirubin: 1 mg/dL (ref 0.2–1.2)
Total Protein: 6.4 g/dL (ref 6.1–8.1)

## 2015-09-18 NOTE — Progress Notes (Signed)
Cardiology Office Note    Date:  09/18/2015   ID:  Richard Parrish, Richard Parrish [birth year], MRN [id number]  PCP:  Kandice Hams, MD  Cardiologist: Sinclair Grooms, MD   Chief Complaint  Patient presents with  . Coronary Artery Disease    History of Present Illness:  Richard Parrish is a 65 y.o. male CAD, prior bypass graft to RCA for failed angioplasty , patent but diffusely diseased SVG to RCA document by catheterization 2012 , severe degenerative disc disease, depression, abdominal aortic aneurysm ,  PAD with prior left superficial femoral and popliteal atherectomy in-stent, and hypertension.  He has not had angina. No nitroglycerin use is occurred. He is limited by musculoskeletal complaints (see below). He denies episodes of syncope but does have occasional episodes of tingling throughout his body which are unpredictable and lasts less than 5 seconds. No palpitations are associated.   Past Medical History  Diagnosis Date  . Coronary artery disease 1989    cabg with dvg to rca   . Depression   . Osteoarthritis     hip  . CAD (coronary artery disease)   . Hypertension   . Hyperlipidemia   . Anxiety   . Insomnia   . Pulmonary fibrosis ([place])   . Leg pain   . Peripheral vascular disease ([place])   . BPH (benign prostatic hypertrophy)   . GERD (gastroesophageal reflux disease)   . Spinal stenosis   . Sleep apnea     cpap setting of 60 per pt    Past Surgical History  Procedure Laterality Date  . Rca stent    . Rca and rca stents  04/12/11    Left   . Abdominal surgery  04/12/11    Abdominal Angiogram  . Abdominal aortagram N/A 03/15/2011    Procedure: ABDOMINAL Maxcine Ham;  Surgeon: Serafina Mitchell, MD;  Location: [facility] CATH LAB;  Service: Cardiovascular;  Laterality: N/A;  . Abdominal aortagram N/A 04/12/2011    Procedure: ABDOMINAL Maxcine Ham;  Surgeon: Serafina Mitchell, MD;  Location[facility] C[facility];  Service: Cardiovascular;  Laterality: N/A;  . Coronary artery bypass graft        x 1  . Back surgery      lumbar fusion, S L 5 to L 4  . Joint replacement      right hip  . Rotator cuff surgery Right 2006    Current Medications: Outpatient Prescriptions Prior to Visit  Medication Sig Dispense Refill  . ALPRAZolam (XANAX) 0.5 MG tablet Take 0.5 mg by mouth at bedtime as needed for sleep.     Marland Kitchen aspirin 81 MG tablet Take 81 mg by mouth every morning.     Marland Kitchen atorvastatin (LIPITOR) 40 MG tablet TAKE 1 TABLET (40 MG TOTAL) BY MOUTH DAILY. 90 tablet 1  . Calcium Carbonate-Vitamin D (CALCIUM + D PO) Take 1 tablet by mouth every morning.     . clopidogrel (PLAVIX) 75 MG tablet TAKE 1 TABLET BY MOUTH EVERY DAY 90 tablet 2  . gabapentin (NEURONTIN) 300 MG capsule Take 600 mg by mouth 3 (three) times daily.     . hyoscyamine (LEVSIN, ANASPAZ) 0.125 MG tablet Take 0.125 mg by mouth every 4 (four) hours as needed for cramping.     Marland Kitchen ibuprofen (ADVIL,MOTRIN) 200 MG tablet Take 800 mg by mouth 3 (three) times daily.     . methocarbamol (ROBAXIN) 750 MG tablet TAKE 2 TABLETS BY MOUTH 3 TIMES DAILY  2  . metoprolol  succinate (TOPROL-XL) 50 MG 24 hr tablet TAKE 1 TABLET DAILY (NEED APPOINTMENT) 90 tablet 1  . Multiple Vitamin (MULTIVITAMIN) tablet Take 1 tablet by mouth every morning.     . naproxen (NAPROSYN) 500 MG tablet TAKE 1 TABLET BY ORAL ROUTE 2 TIMES EVERY DAY WITH FOOD AS NEEDED  2  . nitroGLYCERIN (NITRODUR - DOSED IN MG/24 HR) 0.2 mg/hr patch Use 1/4 patch daily to the affected area 30 patch 1  . nitroGLYCERIN (NITROSTAT) 0.4 MG SL tablet Place 0.4 mg under the tongue every 5 (five) minutes as needed for chest pain.    . Omega-3 Fatty Acids (FISH OIL) 1000 MG CAPS Take 1,000 mg by mouth every morning.     Marland Kitchen PARoxetine (PAXIL) 40 MG tablet Take 40 mg by mouth daily.  6  . tamsulosin (FLOMAX) 0.4 MG CAPS capsule Take 1 capsule by mouth every morning.  5  . valsartan (DIOVAN) 320 MG tablet TAKE 1 TABLET BY MOUTH EVERY DAY 30 tablet 11  . traMADol (ULTRAM) 50 MG tablet Take  100 mg by mouth 3 (three) times daily. Reported on 09/18/2015     No facility-administered medications prior to visit.     Allergies:   Ambien and Zolpidem tartrate   Social History   Social History  . Marital Status: Unknown    Spouse Name: N/A  . Number of Children: N/A  . Years of Education: N/A   Occupational History  . Retired    Social History Main Topics  . Smoking status: Former Smoker -- 2.00 packs/day for 15 years    Types: Cigarettes    Quit date: 03/01/1987  . Smokeless tobacco: Never Used  . Alcohol Use: Yes     Comment: weekly  . Drug Use: No  . Sexual Activity: Not Asked   Other Topics Concern  . None   Social History Narrative   ** Merged History Encounter **         Family History:  The patient's family history includes Cancer in his father; Heart disease in his mother.   ROS:   Please see the history of present illness.    Left Achilles tear, chronic lumbar disc/back pain, depression, easy bruising, anxiety disorder. Has mentioned above he gets a fuzzy mental disposition followed by tingling in his arms and legs which can last up to 30 seconds of which 3 seconds are severe. No common precipitants have been noted.  All other systems reviewed and are negative.   PHYSICAL EXAM:   VS:  BP 114/78 mmHg  Pulse 60  Ht 6' (1.829 m)  Wt 225 lb 6.4 oz (102.241 kg)  BMI 30.56 kg/m2   GEN: Well nourished, well developed, in no acute distress HEENT: normal Neck: no JVD, carotid bruits, or masses Cardiac: RRR; no murmurs, rubs, or gallops,no edema  Respiratory:  clear to auscultation bilaterally, normal work of breathing GI: soft, nontender, nondistended, + BS MS: no deformity or atrophy Skin: warm and dry, no rash Neuro:  Alert and Oriented x 3, Strength and sensation are intact Psych: euthymic mood, full affect  Wt Readings from Last 3 Encounters:  09/18/15 225 lb 6.4 oz (102.241 kg)  09/07/15 224 lb (101.606 kg)  08/10/15 225 lb (102.059 kg)       Studies/Labs Reviewed:   EKG:  EKG  Sinus bradycardia at 60 bpm and otherwise normal  Recent Labs: No results found for requested labs within last 365 days.   Lipid Panel No results found for: CHOL,  TRIG, HDL, CHOLHDL, VLDL, LDLCALC, LDLDIRECT  Additional studies/ records that were reviewed today include:  No new data. No recent laboratory data.    ASSESSMENT:    1. Coronary artery disease involving native coronary artery of native heart without angina pectoris   2. Essential hypertension   3. Hyperlipidemia   4. Peripheral vascular disease ([place])      PLAN:  In order of problems listed above:  1. This coronary disease is stable within his basic heart neurovascular/activity realm. He is not using nitroglycerin. Encouraged him to use as little nostril anti-inflammatory therapy as possible. 2. Recommend a low-salt diet, physical activity, and continue the current medical regimen. 3. No recent laboratory data but taking atorvastatin. A liver and lipid panel will be obtained today. 4. Aerobic activity is encouraged. This will help with collateralization.  Plan clinical follow-up in one year.  Medication Adjustments/Labs and Tests Ordered: Current medicines are reviewed at length with the patient today.  Concerns regarding medicines are outlined above.  Medication changes, Labs and Tests ordered today are listed in the Patient Instructions below. There are no Patient Instructions on file for this visit.   Signed, Sinclair Grooms, MD  09/18/2015 9:53 AM    [facility] [address], [place], [place]  [address] Phone: [phone]; Fax: [phone]

## 2015-09-18 NOTE — Patient Instructions (Signed)
Medication Instructions:  Your physician recommends that you continue on your current medications as directed. Please refer to the Current Medication list given to you today.   Labwork: Lipid and Lft today  Testing/Procedures: None ordered  Follow-Up: Your physician wants you to follow-up in: 1 year with Dr.Smith You will receive a reminder letter in the mail two months in advance. If you don't receive a letter, please call our office to schedule the follow-up appointment.   Any Other Special Instructions Will Be Listed Below (If Applicable).     If you need a refill on your cardiac medications before your next appointment, please call your pharmacy.

## 2015-09-21 ENCOUNTER — Other Ambulatory Visit: Payer: Self-pay

## 2015-09-21 DIAGNOSIS — E785 Hyperlipidemia, unspecified: Secondary | ICD-10-CM

## 2015-09-24 ENCOUNTER — Encounter: Payer: Self-pay | Admitting: Sports Medicine

## 2015-09-24 ENCOUNTER — Ambulatory Visit (INDEPENDENT_AMBULATORY_CARE_PROVIDER_SITE_OTHER): Payer: Medicare Other | Admitting: Sports Medicine

## 2015-09-24 VITALS — BP 129/75 | HR 64 | Ht 72.0 in | Wt 225.0 lb

## 2015-09-24 DIAGNOSIS — M67879 Other specified disorders of synovium and tendon, unspecified ankle and foot: Secondary | ICD-10-CM | POA: Diagnosis not present

## 2015-09-24 DIAGNOSIS — I2510 Atherosclerotic heart disease of native coronary artery without angina pectoris: Secondary | ICD-10-CM | POA: Diagnosis not present

## 2015-09-24 DIAGNOSIS — M7660 Achilles tendinitis, unspecified leg: Secondary | ICD-10-CM

## 2015-09-24 NOTE — Progress Notes (Signed)
   CC: Left achilles pain  HPI:  [name] is a 65 y.o. male who returns for follow up of left achilles tendonopathy. He feels his pain and function has worsened since he "tweaked" it about 1 week ago when playing with his dog. He now has pain with walking and is unable to perform the heel drop exercises. He has noticed increased swelling and pain to touch. He played golf 2 days ago and was significantly limited due to his pain. He has been using 1/2 nitroglycerin patches and icing regularly. He continues to take Naproxen and Methocarbamol for his back pain which has provided some relief to his achilles pain as well.  Past Medical History:  Diagnosis Date  . Anxiety   . BPH (benign prostatic hypertrophy)   . CAD (coronary artery disease)   . Coronary artery disease 1989   cabg with dvg to rca   . Depression   . GERD (gastroesophageal reflux disease)   . Hyperlipidemia   . Hypertension   . Insomnia   . Leg pain   . Osteoarthritis    hip  . Peripheral vascular disease ([place])   . Pulmonary fibrosis ([place])   . Sleep apnea    cpap setting of 60 per pt  . Spinal stenosis     Review of Systems:   MSK: Swelling and pain left achilles. Pain with ambulation.  Physical Exam:  Vitals:   09/24/15 0917  BP: 129/75  Pulse: 64  Weight: 225 lb (102.1 kg)  Height: 6' (1.829 m)   Swollen left achilles tendon at ~3-4 cm proximal to insertion site at calcaneus. TTP with light touch over swollen area. Plantar flexion intact with resistance and non-weight bearing bilaterally. Pain with non weight bearing dorsiflexion left foot at 10 degrees. Plantar flexion with [place] Silos test bilaterally. No warmth or tenderness in the subtendinous area.  DP pulses +2 bilaterally, cap refill <2 seconds  U/S: Left achilles tendon thickening and irregularity with partial tear and hypoechoic changes once again seen   Assessment & Plan:   Left Achilles Tendonopathy with partial tear: -Worsened  compared to last visit with increased pain and decreased mobility -Will limit mobility with application of cam walker to left foot for at least 3 weeks -Patient advised to wear boot with activity, can remove at night and when relaxing at home -Hold off on Heel Drop exercises for now -Continue 1/2 nitroglycerin patches -Continue NSAIDs and Ice as needed -f/u 3 weeks   Patient seen and evaluated with the resident. I agree with the above plan of care. Patient continues to reinjure his partial Achilles tendon tear of his left ankle. I've elected to immobilize him in a Cam Walker with a 5/16 inch heel left for at least the next 3 weeks. He will continue with his nitroglycerin patches but will stop his eccentric exercises for the time being. Follow-up in 3 weeks for reevaluation and repeat ultrasound.

## 2015-10-07 ENCOUNTER — Ambulatory Visit: Payer: Medicare Other | Admitting: Sports Medicine

## 2015-10-13 ENCOUNTER — Encounter (INDEPENDENT_AMBULATORY_CARE_PROVIDER_SITE_OTHER): Payer: Self-pay

## 2015-10-13 ENCOUNTER — Ambulatory Visit (INDEPENDENT_AMBULATORY_CARE_PROVIDER_SITE_OTHER): Payer: Medicare Other | Admitting: Sports Medicine

## 2015-10-13 ENCOUNTER — Encounter: Payer: Self-pay | Admitting: Sports Medicine

## 2015-10-13 VITALS — BP 117/68 | HR 70 | Ht 72.0 in | Wt 225.0 lb

## 2015-10-13 DIAGNOSIS — M67879 Other specified disorders of synovium and tendon, unspecified ankle and foot: Secondary | ICD-10-CM

## 2015-10-13 DIAGNOSIS — I2510 Atherosclerotic heart disease of native coronary artery without angina pectoris: Secondary | ICD-10-CM | POA: Diagnosis not present

## 2015-10-13 DIAGNOSIS — M7660 Achilles tendinitis, unspecified leg: Secondary | ICD-10-CM

## 2015-10-13 NOTE — Progress Notes (Signed)
   Subjective:    Patient ID: Richard Parrish, male    DOB: 12-16-1950, 65 y.o.   MRN: [id number]  HPI   Patient comes in today for follow-up on left Achilles tendinopathy. He has been wearing a walking boot for the past 2 weeks. As a result, his pain has greatly improved. He does admit that he will get some pain if he walks too far without the boot on. He continues to use his nitroglycerin patches. He is tolerating these without any difficulty. He has not been golfing but that is because of some back pain more than his Achilles pain.    Review of Systems     Objective:   Physical Exam  Well-developed, well-nourished. No acute distress  Left heel: There is still thickening of the distal Achilles tendon. He is tender to palpation. Mild pain with Achilles stretching. Good strength. Neurovascular intact distally. Walking without a significant limp.      Assessment & Plan:   Left Achilles tendinopathy  I think the patient needs to stay in his walking boot for 2 more weeks. He can then transition into a regular shoe with a heel lift. I would like for him to start some physical therapy and continue with his nitroglycerin patches. I've given him a prescription for physical therapy (he has a local physical therapist that he has worked with before that he would like to return to). Follow-up in 4-6 weeks for reevaluation.

## 2015-10-14 ENCOUNTER — Other Ambulatory Visit: Payer: Medicare Other | Admitting: Sports Medicine

## 2015-10-19 DIAGNOSIS — M7661 Achilles tendinitis, right leg: Secondary | ICD-10-CM | POA: Diagnosis not present

## 2015-10-21 DIAGNOSIS — M7661 Achilles tendinitis, right leg: Secondary | ICD-10-CM | POA: Diagnosis not present

## 2015-11-02 DIAGNOSIS — Z23 Encounter for immunization: Secondary | ICD-10-CM | POA: Diagnosis not present

## 2015-11-11 DIAGNOSIS — M5136 Other intervertebral disc degeneration, lumbar region: Secondary | ICD-10-CM | POA: Diagnosis not present

## 2015-11-11 DIAGNOSIS — M791 Myalgia: Secondary | ICD-10-CM | POA: Diagnosis not present

## 2015-11-11 DIAGNOSIS — M545 Low back pain: Secondary | ICD-10-CM | POA: Diagnosis not present

## 2015-11-11 DIAGNOSIS — M47817 Spondylosis without myelopathy or radiculopathy, lumbosacral region: Secondary | ICD-10-CM | POA: Diagnosis not present

## 2015-11-18 ENCOUNTER — Ambulatory Visit (INDEPENDENT_AMBULATORY_CARE_PROVIDER_SITE_OTHER): Payer: Medicare Other | Admitting: Sports Medicine

## 2015-11-18 ENCOUNTER — Encounter: Payer: Self-pay | Admitting: Sports Medicine

## 2015-11-18 VITALS — BP 120/70 | Ht 72.0 in | Wt 225.0 lb

## 2015-11-18 DIAGNOSIS — M7660 Achilles tendinitis, unspecified leg: Secondary | ICD-10-CM

## 2015-11-18 DIAGNOSIS — I2510 Atherosclerotic heart disease of native coronary artery without angina pectoris: Secondary | ICD-10-CM

## 2015-11-18 DIAGNOSIS — S4362XA Sprain of left sternoclavicular joint, initial encounter: Secondary | ICD-10-CM

## 2015-11-18 DIAGNOSIS — M67879 Other specified disorders of synovium and tendon, unspecified ankle and foot: Secondary | ICD-10-CM | POA: Diagnosis not present

## 2015-11-18 NOTE — Progress Notes (Signed)
   Subjective:    Patient ID: Richard Parrish, male    DOB: [birth year], 65 y.o.   MRN: [id number]  HPI chief complaint: Left shoulder pain  Patient comes in today complaining of one day of left shoulder pain. He localizes his pain to his sternoclavicular joint. His pain began after golfing yesterday. He states that he hit a couple of shots "fat"but didn't have any immediate pain at that time. His pain developed later that day. In addition to pain, he is also describing a clicking and popping in the sternoclavicular joint. Pain will occasionally radiate up into his neck as well. No deep-seated shoulder pain. No numbness or tingling down the left arm. No similar issues in the past. He did have some pain last night while sleeping. He takes both naproxen sodium and Zanaflex for some chronic low back pain.  He is also here today to follow-up on his left Achilles tendon tendinopathy. Overall, he feels like it is improving. He is still wearing his Cam Walker intermittently. Still using his heel lifts in his shoes. Still wearing his nitroglycerin patches.    Review of Systems    as above Objective:   Physical Exam  Well-developed, well-nourished. No acute distress. Vital signs reviewed.  Left shoulder: Patient is tender to palpation directly over the left [place] joint. There is no gross deformity. No obvious swelling. No appreciable subluxation. Patient has reproducible pain with reaching across his body. No pain with abduction. No tenderness to palpation over the rest of the clavicle or over the acromioclavicular joint.  Left Achilles: There is still appreciable thickening of the distal Achilles tendon. Minimal tenderness to palpation. No swelling. Ambulating without a limp.  MSK ultrasound of the left Achilles tendon was performed. The Achilles tendon is still thickened but the hypoechoic changes seen on his previous scan are much less obvious today.      Assessment & Plan:   Left shoulder pain  secondary to sternoclavicular joint sprain Left Achilles tendinopathy  Were going to take a watchful waiting approach on his left sternoclavicular joint sprain. If symptoms persist we could try a 6 day Sterapred Dosepak. If symptoms persist much beyond that, then I would consider imaging initially in the form of an x-ray. For his left Achilles tendinopathy, he is slowly improving. He will continue on his nitroglycerin patches and follow-up with me in 6 weeks for repeat ultrasound. He will continue to wean from his Cam Walker as his symptoms allow and transition into shoes with heel lifts. I think he is okay to continue with activity as tolerated in regards to both his left sternoclavicular joint sprain and his left Achilles tendinopathy. He will call with questions or concerns prior to his follow-up visit.  Total time skin with the patient was 30 minutes with greater than 50% of the time spent in face-to-face consultation discussing his diagnosis and treatment.

## 2015-11-21 DIAGNOSIS — Z23 Encounter for immunization: Secondary | ICD-10-CM | POA: Diagnosis not present

## 2015-11-23 ENCOUNTER — Ambulatory Visit: Payer: Medicare Other | Admitting: Sports Medicine

## 2015-12-02 DIAGNOSIS — H168 Other keratitis: Secondary | ICD-10-CM | POA: Diagnosis not present

## 2015-12-04 DIAGNOSIS — H168 Other keratitis: Secondary | ICD-10-CM | POA: Diagnosis not present

## 2015-12-14 ENCOUNTER — Other Ambulatory Visit: Payer: Self-pay | Admitting: *Deleted

## 2015-12-14 MED ORDER — ATORVASTATIN CALCIUM 40 MG PO TABS: ORAL_TABLET | ORAL | 2 refills | Status: DC

## 2015-12-14 MED ORDER — METOPROLOL SUCCINATE ER 50 MG PO TB24: ORAL_TABLET | ORAL | 2 refills | Status: DC

## 2015-12-21 ENCOUNTER — Other Ambulatory Visit (HOSPITAL_COMMUNITY): Payer: Medicare Other

## 2015-12-21 ENCOUNTER — Ambulatory Visit: Payer: Medicare Other | Admitting: Family

## 2015-12-21 ENCOUNTER — Encounter (HOSPITAL_COMMUNITY): Payer: Medicare Other

## 2015-12-24 DIAGNOSIS — R197 Diarrhea, unspecified: Secondary | ICD-10-CM | POA: Diagnosis not present

## 2015-12-25 DIAGNOSIS — R197 Diarrhea, unspecified: Secondary | ICD-10-CM | POA: Diagnosis not present

## 2015-12-28 DIAGNOSIS — R197 Diarrhea, unspecified: Secondary | ICD-10-CM | POA: Diagnosis not present

## 2015-12-30 ENCOUNTER — Ambulatory Visit (INDEPENDENT_AMBULATORY_CARE_PROVIDER_SITE_OTHER): Payer: Medicare Other | Admitting: Family Medicine

## 2015-12-30 ENCOUNTER — Encounter: Payer: Self-pay | Admitting: Family Medicine

## 2015-12-30 DIAGNOSIS — M25572 Pain in left ankle and joints of left foot: Secondary | ICD-10-CM

## 2015-12-30 DIAGNOSIS — M7660 Achilles tendinitis, unspecified leg: Secondary | ICD-10-CM

## 2015-12-30 NOTE — Progress Notes (Signed)
  Richard Parrish - 65 y.o. male MRN [id number]  Date of birth: 09/18/1950  SUBJECTIVE:  Including CC & ROS.    Richard Parrish is a 65 year old male that is following up for his left Achilles tendinopathy. He is still having significant swelling and pain while playing golf. He is been using a cam walker intermittently. He has used ice with some benefit. He has tried nitroglycerin patches intermittently as well.  ROS: No unexpected weight loss, fever, chills, instability, muscle pain, numbness/tingling, redness, otherwise see HPI    HISTORY: Past Medical, Surgical, Social, and Family History Reviewed & Updated per EMR.   Pertinent Historical Findings include: PMSHx -  Spinal stenosis, PVC BPH  DATA REVIEWED: None to review  PHYSICAL EXAM:  VS: BP:130/80  HR: bpm  TEMP: ( )  RESP:   HT:6' (182.9 cm)   WT:225 lb (102.1 kg)  BMI:30.6 PHYSICAL EXAM: Gen: NAD, alert, cooperative with exam, well-appearing HEENT: clear conjunctiva, EOMI CV:  no edema, bluish discoloration of his toes Resp: non-labored, normal speech Skin: no rashes, normal turgor  Neuro: no gross deficits.  Psych:  alert and oriented Left Foot/Ankle:  Significant enlargement of the Achilles roughly 4 cm proximal to the insertion. Tenderness to palpation or this area as well. Some noticeable size difference of the circumference of the gastroc on the left compared to the right Normal range of motion of the ankle. Some tenderness palpation at the insertion of the Achilles. Negative Thompson's test  Limited ultrasound: Left Achilles: Significant enlargement of the Achilles measuring 1.09 cm at the thickest portion. There is also a hypoechoic change that would suggest a retrocalcaneal bursitis. In the short axis there appears to be hypoechoic change to suggest tendinopathy within the tendon itself. There is no apparent tear.  ASSESSMENT & PLAN:   Achilles tendon pain Significant enlargement of the Achilles on ultrasound  today. He has been using the nitroglycerin intermittently to this point. - Reemphasized the importance of using nitroglycerin on a regular basis. Also encouraged Alfredson's exercises. - He can try a body helix of his left ankle  - He will follow-up in 6 weeks.

## 2015-12-31 DIAGNOSIS — M7660 Achilles tendinitis, unspecified leg: Secondary | ICD-10-CM | POA: Insufficient documentation

## 2015-12-31 NOTE — Assessment & Plan Note (Signed)
Significant enlargement of the Achilles on ultrasound today. He has been using the nitroglycerin intermittently to this point. - Reemphasized the importance of using nitroglycerin on a regular basis. Also encouraged Alfredson's exercises. - He can try a body helix of his left ankle  - He will follow-up in 6 weeks.

## 2016-01-07 DIAGNOSIS — M545 Low back pain: Secondary | ICD-10-CM | POA: Diagnosis not present

## 2016-01-07 DIAGNOSIS — M5136 Other intervertebral disc degeneration, lumbar region: Secondary | ICD-10-CM | POA: Diagnosis not present

## 2016-01-07 DIAGNOSIS — M47817 Spondylosis without myelopathy or radiculopathy, lumbosacral region: Secondary | ICD-10-CM | POA: Diagnosis not present

## 2016-01-13 ENCOUNTER — Telehealth: Payer: Self-pay | Admitting: Interventional Cardiology

## 2016-01-13 NOTE — Telephone Encounter (Signed)
Pt  Having a steriod injection in his back 01-18-16 and needs to come off plavix prior, is this ok, how many days, and when can he start back?

## 2016-01-14 NOTE — Telephone Encounter (Signed)
Left message to call back  

## 2016-01-14 NOTE — Telephone Encounter (Signed)
Yes, it is okay to hold the Plavix for up to 7 days prior to steroid injection and resume when safe.

## 2016-01-18 ENCOUNTER — Encounter (HOSPITAL_COMMUNITY): Payer: Medicare Other

## 2016-01-18 ENCOUNTER — Other Ambulatory Visit (HOSPITAL_COMMUNITY): Payer: Medicare Other

## 2016-01-18 ENCOUNTER — Ambulatory Visit: Payer: Medicare Other | Admitting: Family

## 2016-01-18 DIAGNOSIS — M5136 Other intervertebral disc degeneration, lumbar region: Secondary | ICD-10-CM | POA: Diagnosis not present

## 2016-01-19 ENCOUNTER — Other Ambulatory Visit (HOSPITAL_COMMUNITY): Payer: Medicare Other

## 2016-01-19 ENCOUNTER — Encounter (HOSPITAL_COMMUNITY): Payer: Medicare Other

## 2016-01-20 ENCOUNTER — Encounter: Payer: Self-pay | Admitting: Family

## 2016-01-25 ENCOUNTER — Ambulatory Visit: Payer: Medicare Other | Admitting: Family

## 2016-01-27 ENCOUNTER — Encounter: Payer: Self-pay | Admitting: Family

## 2016-01-27 ENCOUNTER — Ambulatory Visit (INDEPENDENT_AMBULATORY_CARE_PROVIDER_SITE_OTHER): Payer: Medicare Other | Admitting: Family

## 2016-01-27 ENCOUNTER — Ambulatory Visit (HOSPITAL_COMMUNITY)
Admission: RE | Admit: 2016-01-27 | Discharge: 2016-01-27 | Disposition: A | Discharge status: Home / Self Care | Payer: Medicare Other | Source: Ambulatory Visit | Attending: Family | Admitting: Family

## 2016-01-27 VITALS — BP 112/62 | HR 58 | Temp 97.1°F | Resp 16 | Ht 72.0 in | Wt 226.0 lb

## 2016-01-27 DIAGNOSIS — I779 Disorder of arteries and arterioles, unspecified: Secondary | ICD-10-CM

## 2016-01-27 DIAGNOSIS — Z959 Presence of cardiac and vascular implant and graft, unspecified: Secondary | ICD-10-CM | POA: Diagnosis not present

## 2016-01-27 NOTE — Progress Notes (Signed)
[facility] [place]   CC: Follow up peripheral artery occlusive disease  History of Present Illness Richard Parrish is a 65 y.o. male patient of Dr. Trula Slade who is s/p left SFA and popliteal atherectomy and stent placement on 04/13/11 for left calf claudication.  He has had total resolution of left calf claudication and has no other claudication symptoms, denies non healing wounds. He denies any history of stroke or TIA. He has several cardiac stents and CABG in 1989 of the right RCA.  Back and hip issues that are being evaluated and treated by Dr. Maia Petties.  Pt Diabetic: No Pt smoker: former smoker, quit in 1989  Pt meds include: Statin :Yes Betablocker: Yes ASA: Yes Other anticoagulants/antiplatelets: Plavix     Past Medical History:  Diagnosis Date  . Anxiety   . BPH (benign prostatic hypertrophy)   . CAD (coronary artery disease)   . Coronary artery disease 1989   cabg with dvg to rca   . Depression   . GERD (gastroesophageal reflux disease)   . Hyperlipidemia   . Hypertension   . Insomnia   . Leg pain   . Osteoarthritis    hip  . Peripheral vascular disease ([place])   . Pulmonary fibrosis ([place])   . Sleep apnea    cpap setting of 60 per pt  . Spinal stenosis     Social History Social History  Substance Use Topics  . Smoking status: Former Smoker    Packs/day: 2.00    Years: 15.00    Types: Cigarettes    Quit date: 03/01/1987  . Smokeless tobacco: Never Used  . Alcohol use Yes     Comment: weekly    Family History Family History  Problem Relation Age of Onset  . Heart disease Mother   . Cancer Father     Past Surgical History:  Procedure Laterality Date  . ABDOMINAL AORTAGRAM N/A 03/15/2011   Procedure: ABDOMINAL Maxcine Ham;  Surgeon: Serafina Mitchell, MD;  Location: [facility] C[facility] LAB;  Service: Cardiovascular;  Laterality: N/A;  . ABDOMINAL AORTAGRAM N/A 04/12/2011   Procedure: ABDOMINAL Maxcine Ham;  Surgeon: Serafina Mitchell, MD;   Location: [facility] CATH LAB;  Service: Cardiovascular;  Laterality: N/A;  . ABDOMINAL SURGERY  04/12/11   Abdominal Angiogram  . BACK SURGERY     lumbar fusion, S L 5 to L 4  . CORONARY ARTERY BYPASS GRAFT     x 1  . JOINT REPLACEMENT     right hip  . RCA AND RCA STENTS  04/12/11   Left   . rca stent    . rotator cuff surgery Right 2006    Allergies  Allergen Reactions  . Ambien [Zolpidem Tartrate] Other (See Comments)    Bad dreams  . Zolpidem Tartrate Other (See Comments)    Vivid dreams    Current Outpatient Prescriptions  Medication Sig Dispense Refill  . ALPRAZolam (XANAX) 0.5 MG tablet Take 0.5 mg by mouth at bedtime as needed for sleep.     Marland Kitchen aspirin 81 MG tablet Take 81 mg by mouth every morning.     Marland Kitchen atorvastatin (LIPITOR) 40 MG tablet TAKE 1 TABLET (40 MG TOTAL) BY MOUTH DAILY. 90 tablet 2  . Calcium Carbonate-Vitamin D (CALCIUM + D PO) Take 1 tablet by mouth every morning.     . clopidogrel (PLAVIX) 75 MG tablet TAKE 1 TABLET BY MOUTH EVERY DAY 90 tablet 2  . Coenzyme Q10 (COQ10 PO) Take 1 capsule by  mouth daily.    Marland Kitchen FLUZONE HIGH-DOSE 0.5 ML SUSY TO BE ADMINISTERED BY PHARMACIST FOR IMMUNIZATION  0  . gabapentin (NEURONTIN) 300 MG capsule Take 600 mg by mouth 3 (three) times daily.     Marland Kitchen HYDROcodone-acetaminophen (NORCO) 7.5-325 MG tablet     . hyoscyamine (LEVSIN, ANASPAZ) 0.125 MG tablet Take 0.125 mg by mouth every 4 (four) hours as needed for cramping.     . methocarbamol (ROBAXIN) 750 MG tablet TAKE 2 TABLETS BY MOUTH 3 TIMES DAILY  2  . Multiple Vitamin (MULTIVITAMIN) tablet Take 1 tablet by mouth every morning.     . naproxen (NAPROSYN) 500 MG tablet TAKE 1 TABLET BY ORAL ROUTE 2 TIMES EVERY DAY WITH FOOD AS NEEDED  2  . nitroGLYCERIN (NITRODUR - DOSED IN MG/24 HR) 0.2 mg/hr patch Use 1/4 patch daily to the affected area 30 patch 1  . nitroGLYCERIN (NITROSTAT) 0.4 MG SL tablet Place 0.4 mg under the tongue every 5 (five) minutes as needed for chest pain.    .  Omega-3 Fatty Acids (FISH OIL) 1000 MG CAPS Take 1,000 mg by mouth every morning.     . Omeprazole Magnesium (PRILOSEC OTC PO) Take 1 tablet by mouth daily.    Marland Kitchen PARoxetine (PAXIL) 40 MG tablet Take 40 mg by mouth daily.  6  . tamsulosin (FLOMAX) 0.4 MG CAPS capsule Take 1 capsule by mouth every morning.  5  . traMADol (ULTRAM) 50 MG tablet     . valsartan (DIOVAN) 320 MG tablet TAKE 1 TABLET BY MOUTH EVERY DAY 30 tablet 11   No current facility-administered medications for this visit.     ROS: See HPI for pertinent positives and negatives.   Physical Examination  Vitals:   01/27/16 1414 01/27/16 1420  BP: 123/73 112/62  Pulse: (!) 58   Resp: 16   Temp: 97.1 F (36.2 C)   SpO2: 96%   Weight: 226 lb (102.5 kg)   Height: 6' (1.829 m)    Body mass index is 30.65 kg/m.  General:A&O x 3,  WDWN. Gait: normal Eyes: PERRLA. Pulmonary: Respirations are non labored, CTAB, without wheezes , rales or rhonchi. Cardiac: regular rythm, no detected murmur.         Carotid Bruits Right Left   Negative Negative  Aorta is not palpable. Radial pulses: 2+ palpable and =                           VASCULAR EXAM: Extremities without ischemic changes, without Gangrene; without open wounds.                                                                                                                                                       LE Pulses Right Left  FEMORAL  1+ palpable  1+ palpable       POPLITEAL  not palpable  not palpable       POSTERIOR TIBIAL  not palpable  not palpable       DORSALIS PEDIS      ANTERIOR TIBIAL 2+ palpable 2+ palpable   Abdomen: soft, NT, no palpable masses. Skin: no rashes, no ulcers. Musculoskeletal: no muscle wasting or atrophy.         Neurologic: A&O X 3; Appropriate Affect ; SENSATION: normal; MOTOR FUNCTION:  moving all extremities equally, motor strength 5/5 throughout. Speech is fluent/normal. CN 2-12 intact. Loquacious.       ASSESSMENT: Richard Parrish is a 65 y.o. male who is s/p left SFA and politeal atherectomy and stent placement on 04/13/11 for left calf claudication.  He has no claudication since the left SFA stent placement. His walking is limited by lumbar spine and hip pain which is under treatment. He also has left achilles tendon problems.  He stopped smoking in 1989 and does not have DM.   DATA Today's left LE arterial duplex suggests a patent left SFA stent, no evidence of restenosis; no significant change compared to the last exam on 12/15/14. ABI's and TBI's  remain normal with all triphasic waveforms.    PLAN:  Based on the patient's vascular studies and examination, pt will return to clinic in 1 year with ABI's.  Regular exercise in a swimming pool as discussed as walking aggravates his back and achilles tendon issues.   I discussed in depth with the patient the nature of atherosclerosis, and emphasized the importance of maximal medical management including strict control of blood pressure, blood glucose, and lipid levels, obtaining regular exercise, and continued cessation of smoking.  The patient is aware that without maximal medical management the underlying atherosclerotic disease process will progress, limiting the benefit of any interventions.  The patient was given information about PAD including signs, symptoms, treatment, what symptoms should prompt the patient to seek immediate medical care, and risk reduction measures to take.  Clemon Chambers, RN, MSN, FNP-C [facility] Phone: [phone]  Clinic MD: Early  01/27/16 2:49 PM

## 2016-01-27 NOTE — Patient Instructions (Signed)

## 2016-01-28 ENCOUNTER — Other Ambulatory Visit: Payer: Self-pay | Admitting: Surgery

## 2016-01-28 DIAGNOSIS — Z79899 Other long term (current) drug therapy: Secondary | ICD-10-CM | POA: Diagnosis not present

## 2016-01-28 DIAGNOSIS — G894 Chronic pain syndrome: Secondary | ICD-10-CM | POA: Diagnosis not present

## 2016-01-28 DIAGNOSIS — M791 Myalgia: Secondary | ICD-10-CM | POA: Diagnosis not present

## 2016-01-28 DIAGNOSIS — M5136 Other intervertebral disc degeneration, lumbar region: Secondary | ICD-10-CM | POA: Diagnosis not present

## 2016-01-28 DIAGNOSIS — Z79891 Long term (current) use of opiate analgesic: Secondary | ICD-10-CM | POA: Diagnosis not present

## 2016-01-28 DIAGNOSIS — M545 Low back pain: Secondary | ICD-10-CM | POA: Diagnosis not present

## 2016-01-28 NOTE — Addendum Note (Signed)
Addended by: Lianne Cure A on: 01/28/2016 01:09 PM   Modules accepted: Orders

## 2016-02-08 DIAGNOSIS — M47816 Spondylosis without myelopathy or radiculopathy, lumbar region: Secondary | ICD-10-CM | POA: Diagnosis not present

## 2016-02-10 ENCOUNTER — Encounter: Payer: Self-pay | Admitting: Sports Medicine

## 2016-02-10 ENCOUNTER — Ambulatory Visit (INDEPENDENT_AMBULATORY_CARE_PROVIDER_SITE_OTHER): Payer: Medicare Other | Admitting: Sports Medicine

## 2016-02-10 VITALS — BP 134/84 | Ht 72.0 in | Wt 225.0 lb

## 2016-02-10 DIAGNOSIS — M7660 Achilles tendinitis, unspecified leg: Secondary | ICD-10-CM | POA: Diagnosis present

## 2016-02-10 DIAGNOSIS — I779 Disorder of arteries and arterioles, unspecified: Secondary | ICD-10-CM

## 2016-02-10 NOTE — Progress Notes (Signed)
   Subjective:    Patient ID: Richard Parrish, male    DOB: [birth year], 65 y.o.   MRN: [id number]  HPI  Patient comes in today for follow-up on left Achilles tendinopathy. Overall, he feels like things have improved quite a bit over the past several weeks. He is still not using his nitroglycerin appropriately. He is using it as needed instead of scheduled. He is also doing his exercises inconsistently. Despite all of this, his pain and swelling are improving. He has been able to enjoy golfing. He has not been playing basketball or football.    Review of Systems    as above Objective:   Physical Exam  Well-developed, well-nourished. No acute distress  Left heel: There is still some appreciable thickening of the mid substance of the Achilles tendon but it is less than on his previous exam. There is still some tenderness to palpation along the medial most aspect of the tendon but no soft tissue swelling. Good strength. No pain with Achilles stretching. Neurovascularly intact distally. Walking without a limp.      Assessment & Plan:   Improving left Achilles tendinopathy  I think it is in the patient's best interest to avoid basketball for the foreseeable future. I think that'll definitely re-aggravate his condition. He enjoys golfing more than anything and I think that is a reasonable goal. I once again explained the need for scheduled nitroglycerin treatment for this condition but he seems to be getting better with intermittent use. At this point in time, he would like to leave follow-up open-ended. He is pleased with his progress to date. He understands that he can return to the office if his pain once again worsens.

## 2016-02-17 ENCOUNTER — Other Ambulatory Visit: Payer: Self-pay | Admitting: Interventional Cardiology

## 2016-02-17 DIAGNOSIS — M47816 Spondylosis without myelopathy or radiculopathy, lumbar region: Secondary | ICD-10-CM | POA: Diagnosis not present

## 2016-02-19 ENCOUNTER — Ambulatory Visit (INDEPENDENT_AMBULATORY_CARE_PROVIDER_SITE_OTHER): Payer: Medicare Other | Admitting: Sports Medicine

## 2016-02-19 ENCOUNTER — Encounter: Payer: Self-pay | Admitting: Sports Medicine

## 2016-02-19 VITALS — BP 138/80 | Ht 72.0 in | Wt 221.0 lb

## 2016-02-19 DIAGNOSIS — I779 Disorder of arteries and arterioles, unspecified: Secondary | ICD-10-CM

## 2016-02-19 DIAGNOSIS — M7660 Achilles tendinitis, unspecified leg: Secondary | ICD-10-CM | POA: Diagnosis not present

## 2016-02-19 NOTE — Progress Notes (Signed)
   Subjective:    Patient ID: Richard Parrish, male    DOB: 1950/07/01, 65 y.o.   MRN: [id number]  HPI chief complaint: Left Achilles pain  Patient comes in today with returning left Achilles pain. He was last seen in our office earlier this week. At the time of that office visit he was doing very well. He had some mild pain in his Achilles but it was definitely improved over his previous visits. He continued to do well up until 2 days ago. He stepped into a hole in a parking lot injuring the left Achilles. Immediate pain. Some swelling and bruising after the injury. He has been walking with a limp since. Previous ultrasounds have shown changes in the tendon consistent with tendinopathy but nothing acute.     Review of Systems     Objective:   Physical Exam  Well-developed, well-nourished. No acute distress. Awake alert and oriented 3. Vital signs reviewed  Left Achilles: Plantar flexion is intact with Thompson's testing. Mild swelling along the Achilles tendon is noted. Chronic thickening due to his tendinopathy is also noted. He is tender to palpation in the mid substance of the Achilles tendon. Reproducible pain with Achilles stretching. Mildly decreased strength with plantar flexion due to pain. Neurovascularly intact distally. Walking with a slight limp.  MSK ultrasound of the left Achilles was performed. Images were obtained in long and short axis and were compared to previous ultrasound images. There is still evidence of tendinopathy although it is not as pronounced as on his previous scans. He definitely still has Achilles tendon thickening and there may be an area of acute tearing along the lateral most portion of the Achilles tendon. This is best seen on one of the short axis views. An acute injury in this area cannot be excluded. Achilles tendon is intact at the calcaneal insertion. There is no stump sign to suggest a complete rupture.       Assessment & Plan:   Left Achilles  tendon pain-rule out acute tendon tear  Patient has a definite history of left Achilles tendinopathy. Today's ultrasound suggests a possible acute injury to the Achilles tendon but we will need to confirm this on MRI. He certainly does not have a complete rupture of his Achilles tendon. I will follow-up with him via telephone with those results once available. In the meantime, I recommend that he resume wearing his Cam Walker with his heel lift until his pain improved to the point that his limp resolves. He will then transition into a regular shoe with his heel lift.

## 2016-02-24 ENCOUNTER — Ambulatory Visit
Admission: RE | Admit: 2016-02-24 | Discharge: 2016-02-24 | Disposition: A | Discharge status: Home / Self Care | Payer: Medicare Other | Source: Ambulatory Visit | Attending: Sports Medicine | Admitting: Sports Medicine

## 2016-02-24 DIAGNOSIS — M7660 Achilles tendinitis, unspecified leg: Secondary | ICD-10-CM

## 2016-02-24 DIAGNOSIS — M25572 Pain in left ankle and joints of left foot: Secondary | ICD-10-CM | POA: Diagnosis not present

## 2016-02-25 DIAGNOSIS — M545 Low back pain: Secondary | ICD-10-CM | POA: Diagnosis not present

## 2016-02-25 DIAGNOSIS — M791 Myalgia: Secondary | ICD-10-CM | POA: Diagnosis not present

## 2016-02-25 DIAGNOSIS — M5136 Other intervertebral disc degeneration, lumbar region: Secondary | ICD-10-CM | POA: Diagnosis not present

## 2016-02-25 DIAGNOSIS — M47816 Spondylosis without myelopathy or radiculopathy, lumbar region: Secondary | ICD-10-CM | POA: Diagnosis not present

## 2016-03-11 ENCOUNTER — Telehealth: Payer: Self-pay | Admitting: Sports Medicine

## 2016-03-11 DIAGNOSIS — G894 Chronic pain syndrome: Secondary | ICD-10-CM | POA: Diagnosis not present

## 2016-03-11 DIAGNOSIS — F324 Major depressive disorder, single episode, in partial remission: Secondary | ICD-10-CM | POA: Diagnosis not present

## 2016-03-11 NOTE — Telephone Encounter (Signed)
  I spoke with the patient on the phone today after reviewing an MRI of his left Achilles. MRI shows evidence of chronic Achilles tendinopathy without an acute tear. He'll continue with treatment as outlined in the previous notes and will follow-up with me as needed.

## 2016-03-30 DIAGNOSIS — M47816 Spondylosis without myelopathy or radiculopathy, lumbar region: Secondary | ICD-10-CM | POA: Diagnosis not present

## 2016-04-28 DIAGNOSIS — M47817 Spondylosis without myelopathy or radiculopathy, lumbosacral region: Secondary | ICD-10-CM | POA: Diagnosis not present

## 2016-04-28 DIAGNOSIS — M47816 Spondylosis without myelopathy or radiculopathy, lumbar region: Secondary | ICD-10-CM | POA: Diagnosis not present

## 2016-04-28 DIAGNOSIS — M545 Low back pain: Secondary | ICD-10-CM | POA: Diagnosis not present

## 2016-04-28 DIAGNOSIS — Z79891 Long term (current) use of opiate analgesic: Secondary | ICD-10-CM | POA: Diagnosis not present

## 2016-04-28 DIAGNOSIS — G894 Chronic pain syndrome: Secondary | ICD-10-CM | POA: Diagnosis not present

## 2016-04-28 DIAGNOSIS — M5136 Other intervertebral disc degeneration, lumbar region: Secondary | ICD-10-CM | POA: Diagnosis not present

## 2016-04-28 DIAGNOSIS — Z79899 Other long term (current) drug therapy: Secondary | ICD-10-CM | POA: Diagnosis not present

## 2016-05-05 DIAGNOSIS — M25551 Pain in right hip: Secondary | ICD-10-CM | POA: Diagnosis not present

## 2016-05-19 DIAGNOSIS — M545 Low back pain: Secondary | ICD-10-CM | POA: Diagnosis not present

## 2016-05-19 DIAGNOSIS — M47816 Spondylosis without myelopathy or radiculopathy, lumbar region: Secondary | ICD-10-CM | POA: Diagnosis not present

## 2016-05-19 DIAGNOSIS — M47817 Spondylosis without myelopathy or radiculopathy, lumbosacral region: Secondary | ICD-10-CM | POA: Diagnosis not present

## 2016-05-19 DIAGNOSIS — M5136 Other intervertebral disc degeneration, lumbar region: Secondary | ICD-10-CM | POA: Diagnosis not present

## 2016-06-01 ENCOUNTER — Encounter: Payer: Self-pay | Admitting: Family Medicine

## 2016-06-01 ENCOUNTER — Ambulatory Visit (INDEPENDENT_AMBULATORY_CARE_PROVIDER_SITE_OTHER): Payer: Medicare Other | Admitting: Family Medicine

## 2016-06-01 VITALS — BP 104/59 | Ht 72.0 in | Wt 221.0 lb

## 2016-06-01 DIAGNOSIS — M25551 Pain in right hip: Secondary | ICD-10-CM

## 2016-06-01 NOTE — Progress Notes (Signed)
  Richard Parrish - 66 y.o. male MRN [id number]  Date of birth: Dec 28, 1950  SUBJECTIVE:  Including CC & ROS.   Richard Parrish is a 66 year old male is presenting with right lateral hip pain. He has a history of a right total hip replacement about 5 years ago. This was performed by his orthopedist at [facility]. He also has a history of peripheral arterial disease and has had stents placed in his legs. He also has a history of spinal stenosis. He was seen by his orthopedist who performed his hip replacement and received an injection is due as greater trochanter. This injection did not improve her changes pain at all. He also had x-rays done at that time that showed his surgical hardware was not loose. He has been taking Norco 3 times a day as well as Tylenol for the pain with limited improvement. He notices the pain with any amount of walking. He tends to play golf on a regular basis and this seems to exacerbate his pain significantly. He denies any radicular symptoms. Pain is usually better with some ice. This pain is fairly acute in nature.  ROS: No unexpected weight loss, fever, chills, swelling, instability, numbness/tingling, redness, otherwise see HPI    HISTORY: Past Medical, Surgical, Social, and Family History Reviewed & Updated per EMR.   Pertinent Historical Findings include: PMSHx -  spinal stenosis, PAD, right total hip arthroplasty   DATA REVIEWED: None  PHYSICAL EXAM:  VS: BP:(!) 104/59  HR: bpm  TEMP: ( )  RESP:   HT:6' (182.9 cm)   WT:221 lb (100.2 kg)  BMI:30 PHYSICAL EXAM: Gen: NAD, alert, cooperative with exam, well-appearing HEENT: clear conjunctiva, EOMI CV:  no edema, capillary refill brisk,  Resp: non-labored, normal speech Skin: no rashes, normal turgor  Neuro: no gross deficits.  Psych:  alert and oriented Right hip: No significant tenderness to palpation over the greater trochanter, SI joint, piriformis, lumbar spine, or paraspinal muscles. Normal hip flexion  to resistance. Good endpoints with internal and external rotation of the hip. Tightness with hamstring testing. Right tighter than left. Negative straight leg raise. Normal knee flexion and extension. Normal strength in lower extremity. Some loss of the transverse arch bilaterally. Plantar fascial fibroma and left and right foot. Neurovascularly intact.  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 His hamstring is also tighter on the right compared to the left. This may be due to his right prosthesis of his hip. Would like to walk without as much pain as he experiences. - Referral to PT. Barbaraann Barthel. - Follow-up in 4 weeks or as needed going forward.

## 2016-06-01 NOTE — Assessment & Plan Note (Signed)
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 His hamstring is also tighter on the right compared to the left. This may be due to his right prosthesis of his hip. Would like to walk without as much pain as he experiences. - Referral to PT. Barbaraann Barthel. - Follow-up in 4 weeks or as needed going forward.

## 2016-06-13 DIAGNOSIS — F411 Generalized anxiety disorder: Secondary | ICD-10-CM | POA: Diagnosis not present

## 2016-06-13 DIAGNOSIS — F324 Major depressive disorder, single episode, in partial remission: Secondary | ICD-10-CM | POA: Diagnosis not present

## 2016-06-15 DIAGNOSIS — M25551 Pain in right hip: Secondary | ICD-10-CM | POA: Diagnosis not present

## 2016-06-15 DIAGNOSIS — S76311D Strain of muscle, fascia and tendon of the posterior muscle group at thigh level, right thigh, subsequent encounter: Secondary | ICD-10-CM | POA: Diagnosis not present

## 2016-06-15 DIAGNOSIS — M7601 Gluteal tendinitis, right hip: Secondary | ICD-10-CM | POA: Diagnosis not present

## 2016-06-15 DIAGNOSIS — Z96641 Presence of right artificial hip joint: Secondary | ICD-10-CM | POA: Diagnosis not present

## 2016-06-20 DIAGNOSIS — S76311D Strain of muscle, fascia and tendon of the posterior muscle group at thigh level, right thigh, subsequent encounter: Secondary | ICD-10-CM | POA: Diagnosis not present

## 2016-06-20 DIAGNOSIS — Z96641 Presence of right artificial hip joint: Secondary | ICD-10-CM | POA: Diagnosis not present

## 2016-06-20 DIAGNOSIS — M4716 Other spondylosis with myelopathy, lumbar region: Secondary | ICD-10-CM | POA: Diagnosis not present

## 2016-06-20 DIAGNOSIS — M791 Myalgia: Secondary | ICD-10-CM | POA: Diagnosis not present

## 2016-06-20 DIAGNOSIS — M4807 Spinal stenosis, lumbosacral region: Secondary | ICD-10-CM | POA: Diagnosis not present

## 2016-06-20 DIAGNOSIS — M25551 Pain in right hip: Secondary | ICD-10-CM | POA: Diagnosis not present

## 2016-06-20 DIAGNOSIS — M7601 Gluteal tendinitis, right hip: Secondary | ICD-10-CM | POA: Diagnosis not present

## 2016-06-23 DIAGNOSIS — M25551 Pain in right hip: Secondary | ICD-10-CM | POA: Diagnosis not present

## 2016-06-23 DIAGNOSIS — S76311D Strain of muscle, fascia and tendon of the posterior muscle group at thigh level, right thigh, subsequent encounter: Secondary | ICD-10-CM | POA: Diagnosis not present

## 2016-06-23 DIAGNOSIS — Z96641 Presence of right artificial hip joint: Secondary | ICD-10-CM | POA: Diagnosis not present

## 2016-06-23 DIAGNOSIS — M7601 Gluteal tendinitis, right hip: Secondary | ICD-10-CM | POA: Diagnosis not present

## 2016-06-27 DIAGNOSIS — Z96641 Presence of right artificial hip joint: Secondary | ICD-10-CM | POA: Diagnosis not present

## 2016-06-27 DIAGNOSIS — M25551 Pain in right hip: Secondary | ICD-10-CM | POA: Diagnosis not present

## 2016-06-27 DIAGNOSIS — S76311D Strain of muscle, fascia and tendon of the posterior muscle group at thigh level, right thigh, subsequent encounter: Secondary | ICD-10-CM | POA: Diagnosis not present

## 2016-06-27 DIAGNOSIS — M7601 Gluteal tendinitis, right hip: Secondary | ICD-10-CM | POA: Diagnosis not present

## 2016-07-04 DIAGNOSIS — M25551 Pain in right hip: Secondary | ICD-10-CM | POA: Diagnosis not present

## 2016-07-04 DIAGNOSIS — Z96641 Presence of right artificial hip joint: Secondary | ICD-10-CM | POA: Diagnosis not present

## 2016-07-04 DIAGNOSIS — S76311D Strain of muscle, fascia and tendon of the posterior muscle group at thigh level, right thigh, subsequent encounter: Secondary | ICD-10-CM | POA: Diagnosis not present

## 2016-07-04 DIAGNOSIS — M7601 Gluteal tendinitis, right hip: Secondary | ICD-10-CM | POA: Diagnosis not present

## 2016-07-05 ENCOUNTER — Encounter: Payer: Self-pay | Admitting: Interventional Cardiology

## 2016-07-07 DIAGNOSIS — M7601 Gluteal tendinitis, right hip: Secondary | ICD-10-CM | POA: Diagnosis not present

## 2016-07-07 DIAGNOSIS — S76311D Strain of muscle, fascia and tendon of the posterior muscle group at thigh level, right thigh, subsequent encounter: Secondary | ICD-10-CM | POA: Diagnosis not present

## 2016-07-07 DIAGNOSIS — Z96641 Presence of right artificial hip joint: Secondary | ICD-10-CM | POA: Diagnosis not present

## 2016-07-07 DIAGNOSIS — M25551 Pain in right hip: Secondary | ICD-10-CM | POA: Diagnosis not present

## 2016-07-13 DIAGNOSIS — S76311D Strain of muscle, fascia and tendon of the posterior muscle group at thigh level, right thigh, subsequent encounter: Secondary | ICD-10-CM | POA: Diagnosis not present

## 2016-07-13 DIAGNOSIS — M25551 Pain in right hip: Secondary | ICD-10-CM | POA: Diagnosis not present

## 2016-07-13 DIAGNOSIS — Z96641 Presence of right artificial hip joint: Secondary | ICD-10-CM | POA: Diagnosis not present

## 2016-07-13 DIAGNOSIS — M7601 Gluteal tendinitis, right hip: Secondary | ICD-10-CM | POA: Diagnosis not present

## 2016-07-14 DIAGNOSIS — M545 Low back pain: Secondary | ICD-10-CM | POA: Diagnosis not present

## 2016-07-14 DIAGNOSIS — R350 Frequency of micturition: Secondary | ICD-10-CM | POA: Diagnosis not present

## 2016-07-14 DIAGNOSIS — G4733 Obstructive sleep apnea (adult) (pediatric): Secondary | ICD-10-CM | POA: Diagnosis not present

## 2016-07-14 DIAGNOSIS — Z125 Encounter for screening for malignant neoplasm of prostate: Secondary | ICD-10-CM | POA: Diagnosis not present

## 2016-07-14 DIAGNOSIS — E7800 Pure hypercholesterolemia, unspecified: Secondary | ICD-10-CM | POA: Diagnosis not present

## 2016-07-14 DIAGNOSIS — Z0000 Encounter for general adult medical examination without abnormal findings: Secondary | ICD-10-CM | POA: Diagnosis not present

## 2016-07-14 DIAGNOSIS — F419 Anxiety disorder, unspecified: Secondary | ICD-10-CM | POA: Diagnosis not present

## 2016-07-14 DIAGNOSIS — Z1389 Encounter for screening for other disorder: Secondary | ICD-10-CM | POA: Diagnosis not present

## 2016-07-14 DIAGNOSIS — I2510 Atherosclerotic heart disease of native coronary artery without angina pectoris: Secondary | ICD-10-CM | POA: Diagnosis not present

## 2016-07-14 DIAGNOSIS — I739 Peripheral vascular disease, unspecified: Secondary | ICD-10-CM | POA: Diagnosis not present

## 2016-07-14 DIAGNOSIS — F324 Major depressive disorder, single episode, in partial remission: Secondary | ICD-10-CM | POA: Diagnosis not present

## 2016-07-14 DIAGNOSIS — G894 Chronic pain syndrome: Secondary | ICD-10-CM | POA: Diagnosis not present

## 2016-07-18 DIAGNOSIS — M4716 Other spondylosis with myelopathy, lumbar region: Secondary | ICD-10-CM | POA: Diagnosis not present

## 2016-07-18 DIAGNOSIS — M545 Low back pain: Secondary | ICD-10-CM | POA: Diagnosis not present

## 2016-07-18 DIAGNOSIS — M4726 Other spondylosis with radiculopathy, lumbar region: Secondary | ICD-10-CM | POA: Diagnosis not present

## 2016-07-19 DIAGNOSIS — Z96641 Presence of right artificial hip joint: Secondary | ICD-10-CM | POA: Diagnosis not present

## 2016-07-19 DIAGNOSIS — S76311D Strain of muscle, fascia and tendon of the posterior muscle group at thigh level, right thigh, subsequent encounter: Secondary | ICD-10-CM | POA: Diagnosis not present

## 2016-07-19 DIAGNOSIS — M25551 Pain in right hip: Secondary | ICD-10-CM | POA: Diagnosis not present

## 2016-07-19 DIAGNOSIS — M7601 Gluteal tendinitis, right hip: Secondary | ICD-10-CM | POA: Diagnosis not present

## 2016-07-20 NOTE — Progress Notes (Signed)
Cardiology Office Note    Date:  07/21/2016   ID:  Zurich, Carreno [birth year], MRN [id number]  PCP:  Seward Carol, MD  Cardiologist: Sinclair Grooms, MD   Chief Complaint  Patient presents with  . Coronary Artery Disease    History of Present Illness:  Richard Parrish is a 66 y.o. male with prior CAD, prior bypass graft to RCA for failed angioplasty , patent but diffusely diseased SVG to RCA document by catheterization 2012 , severe degenerative disc disease, depression, abdominal aortic aneurysm ,  PAD with prior left superficial femoral and popliteal atherectomy in-stent, and hypertension.   He has not needed nitroglycerin. He is having progressive difficulty with his back. Additional surgeries been recommended that he is going for physical therapy instead. He swims twice per week and has shortness of breath with swimming. Consistency of aerobic activity is not good. He denies orthopnea PND. No exercise induced chest discomfort. Denies orthopnea.  Has an upcoming colonoscopy. He is now far enough out from his last stent procedure that Plavix can be held without significant risk.  Past Medical History:  Diagnosis Date  . Anxiety   . BPH (benign prostatic hypertrophy)   . CAD (coronary artery disease)   . Coronary artery disease 1989   cabg with dvg to rca   . Depression   . GERD (gastroesophageal reflux disease)   . Hyperlipidemia   . Hypertension   . Insomnia   . Leg pain   . Osteoarthritis    hip  . Peripheral vascular disease ([place])   . Pulmonary fibrosis ([place])   . Sleep apnea    cpap setting of 60 per pt  . Spinal stenosis     Past Surgical History:  Procedure Laterality Date  . ABDOMINAL AORTAGRAM N/A 03/15/2011   Procedure: ABDOMINAL Maxcine Ham;  Surgeon: Serafina Mitchell, MD;  Location: [facility] CATH LAB;  Service: Cardiovascular;  Laterality: N/A;  . ABDOMINAL AORTAGRAM N/A 04/12/2011   Procedure: ABDOMINAL Maxcine Ham;  Surgeon: Serafina Mitchell, MD;   Location: [facility] CATH LAB;  Service: Cardiovascular;  Laterality: N/A;  . ABDOMINAL SURGERY  04/12/11   Abdominal Angiogram  . BACK SURGERY     lumbar fusion, S L 5 to L 4  . CORONARY ARTERY BYPASS GRAFT     x 1  . JOINT REPLACEMENT     right hip  . RCA AND RCA STENTS  04/12/11   Left   . rca stent    . rotator cuff surgery Right 2006    Current Medications: Outpatient Medications Prior to Visit  Medication Sig Dispense Refill  . aspirin 81 MG tablet Take 81 mg by mouth every morning.     Marland Kitchen atorvastatin (LIPITOR) 40 MG tablet TAKE 1 TABLET (40 MG TOTAL) BY MOUTH DAILY. 90 tablet 2  . Calcium Carbonate-Vitamin D (CALCIUM + D PO) Take 1 tablet by mouth every morning.     . clopidogrel (PLAVIX) 75 MG tablet TAKE 1 TABLET BY MOUTH EVERY DAY 90 tablet 2  . Coenzyme Q10 (COQ10 PO) Take 1 capsule by mouth daily.    Marland Kitchen HYDROcodone-acetaminophen (NORCO) 7.5-325 MG tablet     . hyoscyamine (LEVSIN, ANASPAZ) 0.125 MG tablet Take 0.125 mg by mouth every 4 (four) hours as needed for cramping.     . metoprolol succinate (TOPROL-XL) 50 MG 24 hr tablet     . Multiple Vitamin (MULTIVITAMIN) tablet Take 1 tablet by mouth every morning.     Marland Kitchen  nitroGLYCERIN (NITRODUR - DOSED IN MG/24 HR) 0.2 mg/hr patch Use 1/4 patch daily to the affected area 30 patch 1  . nitroGLYCERIN (NITROSTAT) 0.4 MG SL tablet Place 0.4 mg under the tongue every 5 (five) minutes as needed for chest pain.    . Omega-3 Fatty Acids (FISH OIL) 1000 MG CAPS Take 1,000 mg by mouth every morning.     . Omeprazole Magnesium (PRILOSEC OTC PO) Take 1 tablet by mouth daily.    Marland Kitchen PARoxetine (PAXIL) 40 MG tablet Take 40 mg by mouth daily.  6  . tamsulosin (FLOMAX) 0.4 MG CAPS capsule Take 1 capsule by mouth every morning.  5  . valsartan (DIOVAN) 320 MG tablet TAKE 1 TABLET BY MOUTH EVERY DAY 30 tablet 6  . ALPRAZolam (XANAX) 0.5 MG tablet Take 0.5 mg by mouth at bedtime as needed for sleep.     Marland Kitchen FLUZONE HIGH-DOSE 0.5 ML SUSY TO BE ADMINISTERED BY  PHARMACIST FOR IMMUNIZATION  0  . gabapentin (NEURONTIN) 300 MG capsule Take 600 mg by mouth 3 (three) times daily.     . methocarbamol (ROBAXIN) 750 MG tablet TAKE 2 TABLETS BY MOUTH 3 TIMES DAILY  2  . naproxen (NAPROSYN) 500 MG tablet TAKE 1 TABLET BY ORAL ROUTE 2 TIMES EVERY DAY WITH FOOD AS NEEDED  2  . traMADol (ULTRAM) 50 MG tablet      No facility-administered medications prior to visit.      Allergies:   Ambien [zolpidem tartrate] and Zolpidem tartrate   Social History   Social History  . Marital status: Unknown    Spouse name: N/A  . Number of children: N/A  . Years of education: N/A   Occupational History  . Retired    Social History Main Topics  . Smoking status: Former Smoker    Packs/day: 2.00    Years: 15.00    Types: Cigarettes    Quit date: 03/01/1987  . Smokeless tobacco: Never Used  . Alcohol use Yes     Comment: weekly  . Drug use: No  . Sexual activity: Not Asked   Other Topics Concern  . None   Social History Narrative   ** Merged History Encounter **         Family History:  The patient's family history includes Cancer in his father; Heart disease in his mother.   ROS:   Please see the history of present illness.    He has depression. Chronic back discomfort. Depression is related to be in unable to play golf. He is a semi-professional  All other systems reviewed and are negative.   PHYSICAL EXAM:   VS:  BP 112/70   Pulse 61   Ht 6' (1.829 m)   Wt 226 lb (102.5 kg)   BMI 30.65 kg/m    GEN: Well nourished, well developed, in no acute distress  HEENT: normal  Neck: no JVD, carotid bruits, or masses Cardiac: RRR; no murmurs, rubs, or gallops,no edema  Respiratory:  clear to auscultation bilaterally, normal work of breathing GI: soft, nontender, nondistended, + BS MS: no deformity or atrophy  Skin: warm and dry, no rash Neuro:  Alert and Oriented x 3, Strength and sensation are intact Psych: euthymic mood, full affect  Wt Readings  from Last 3 Encounters:  07/21/16 226 lb (102.5 kg)  06/01/16 221 lb (100.2 kg)  02/19/16 221 lb (100.2 kg)      Studies/Labs Reviewed:   EKG:  EKG  Normal sinus rhythm, biatrial abnormality,  essentially normal in appearance. No change compared to September 2017.  Recent Labs: 09/18/2015: ALT 25   Lipid Panel    Component Value Date/Time   CHOL 145 09/18/2015 0959   TRIG 52 09/18/2015 0959   HDL 53 09/18/2015 0959   CHOLHDL 2.7 09/18/2015 0959   VLDL 10 09/18/2015 0959   LDLCALC 82 09/18/2015 0959    Additional studies/ records that were reviewed today include:  No recent cardiac imaging.    ASSESSMENT:    1. Coronary artery disease of native artery of native heart with stable angina pectoris ([place])   2. Essential hypertension, benign   3. Pure hypercholesterolemia   4. Peripheral vascular disease, unspecified ([place])      PLAN:  In order of problems listed above:  1. Encouraged aerobic activity such as swimming. Sublingual nitroglycerin if chest discomfort. If he begins having chest discomfort he will need coronary angiography. He has very old coronary bypass grafts. 2. Well controlled with target 130/90 mmHg or less. 3. Denies claudication. Does have leg discomfort he feels is related to his lower back. 4. LDL cholesterol target less than 70. This is followed by Dr. Delfina Redwood.  Encouraged aerobic activity. Stressed the importance of acknowledging the presence of chest discomfort and contacting us if it occurs.  Medication Adjustments/Labs and Tests Ordered: Current medicines are reviewed at length with the patient today.  Concerns regarding medicines are outlined above.  Medication changes, Labs and Tests ordered today are listed in the Patient Instructions below. Patient Instructions  Medication Instructions:  None  Labwork: None  Testing/Procedures: None  Follow-Up: Your physician wants you to follow-up in: 1 year with Dr. Tamala Julian.  You will receive a reminder  letter in the mail two months in advance. If you don't receive a letter, please call our office to schedule the follow-up appointment.   Any Other Special Instructions Will Be Listed Below (If Applicable).     If you need a refill on your cardiac medications before your next appointment, please call your pharmacy.      Signed, Sinclair Grooms, MD  07/21/2016 3:06 PM    [facility] [address], [place], [place]  [address] Phone: [phone]; Fax: [phone]

## 2016-07-21 ENCOUNTER — Encounter: Payer: Self-pay | Admitting: Interventional Cardiology

## 2016-07-21 ENCOUNTER — Ambulatory Visit (INDEPENDENT_AMBULATORY_CARE_PROVIDER_SITE_OTHER): Payer: Medicare Other | Admitting: Interventional Cardiology

## 2016-07-21 VITALS — BP 112/70 | HR 61 | Ht 72.0 in | Wt 226.0 lb

## 2016-07-21 DIAGNOSIS — I10 Essential (primary) hypertension: Secondary | ICD-10-CM | POA: Diagnosis not present

## 2016-07-21 DIAGNOSIS — I739 Peripheral vascular disease, unspecified: Secondary | ICD-10-CM | POA: Diagnosis not present

## 2016-07-21 DIAGNOSIS — I25118 Atherosclerotic heart disease of native coronary artery with other forms of angina pectoris: Secondary | ICD-10-CM

## 2016-07-21 DIAGNOSIS — Z96641 Presence of right artificial hip joint: Secondary | ICD-10-CM | POA: Diagnosis not present

## 2016-07-21 DIAGNOSIS — I209 Angina pectoris, unspecified: Secondary | ICD-10-CM | POA: Diagnosis not present

## 2016-07-21 DIAGNOSIS — S76311D Strain of muscle, fascia and tendon of the posterior muscle group at thigh level, right thigh, subsequent encounter: Secondary | ICD-10-CM | POA: Diagnosis not present

## 2016-07-21 DIAGNOSIS — M25551 Pain in right hip: Secondary | ICD-10-CM | POA: Diagnosis not present

## 2016-07-21 DIAGNOSIS — E7800 Pure hypercholesterolemia, unspecified: Secondary | ICD-10-CM | POA: Diagnosis not present

## 2016-07-21 DIAGNOSIS — M7601 Gluteal tendinitis, right hip: Secondary | ICD-10-CM | POA: Diagnosis not present

## 2016-07-21 NOTE — Patient Instructions (Signed)

## 2016-07-28 DIAGNOSIS — H2513 Age-related nuclear cataract, bilateral: Secondary | ICD-10-CM | POA: Diagnosis not present

## 2016-07-28 DIAGNOSIS — H5213 Myopia, bilateral: Secondary | ICD-10-CM | POA: Diagnosis not present

## 2016-07-28 DIAGNOSIS — H04123 Dry eye syndrome of bilateral lacrimal glands: Secondary | ICD-10-CM | POA: Diagnosis not present

## 2016-08-01 DIAGNOSIS — S76311D Strain of muscle, fascia and tendon of the posterior muscle group at thigh level, right thigh, subsequent encounter: Secondary | ICD-10-CM | POA: Diagnosis not present

## 2016-08-01 DIAGNOSIS — Z96641 Presence of right artificial hip joint: Secondary | ICD-10-CM | POA: Diagnosis not present

## 2016-08-01 DIAGNOSIS — M25551 Pain in right hip: Secondary | ICD-10-CM | POA: Diagnosis not present

## 2016-08-01 DIAGNOSIS — M7601 Gluteal tendinitis, right hip: Secondary | ICD-10-CM | POA: Diagnosis not present

## 2016-08-01 DIAGNOSIS — W540XXA Bitten by dog, initial encounter: Secondary | ICD-10-CM | POA: Diagnosis not present

## 2016-08-01 DIAGNOSIS — S01451A Open bite of right cheek and temporomandibular area, initial encounter: Secondary | ICD-10-CM | POA: Diagnosis not present

## 2016-08-03 DIAGNOSIS — R197 Diarrhea, unspecified: Secondary | ICD-10-CM | POA: Diagnosis not present

## 2016-08-03 DIAGNOSIS — W540XXS Bitten by dog, sequela: Secondary | ICD-10-CM | POA: Diagnosis not present

## 2016-08-03 DIAGNOSIS — S01451S Open bite of right cheek and temporomandibular area, sequela: Secondary | ICD-10-CM | POA: Diagnosis not present

## 2016-08-04 DIAGNOSIS — Z8679 Personal history of other diseases of the circulatory system: Secondary | ICD-10-CM | POA: Diagnosis not present

## 2016-08-04 DIAGNOSIS — J8410 Pulmonary fibrosis, unspecified: Secondary | ICD-10-CM | POA: Diagnosis not present

## 2016-08-04 DIAGNOSIS — Z96641 Presence of right artificial hip joint: Secondary | ICD-10-CM | POA: Diagnosis not present

## 2016-08-04 DIAGNOSIS — G4733 Obstructive sleep apnea (adult) (pediatric): Secondary | ICD-10-CM | POA: Diagnosis not present

## 2016-08-04 DIAGNOSIS — M25551 Pain in right hip: Secondary | ICD-10-CM | POA: Diagnosis not present

## 2016-08-04 DIAGNOSIS — S76311D Strain of muscle, fascia and tendon of the posterior muscle group at thigh level, right thigh, subsequent encounter: Secondary | ICD-10-CM | POA: Diagnosis not present

## 2016-08-04 DIAGNOSIS — Z1211 Encounter for screening for malignant neoplasm of colon: Secondary | ICD-10-CM | POA: Diagnosis not present

## 2016-08-04 DIAGNOSIS — R195 Other fecal abnormalities: Secondary | ICD-10-CM | POA: Diagnosis not present

## 2016-08-04 DIAGNOSIS — M7601 Gluteal tendinitis, right hip: Secondary | ICD-10-CM | POA: Diagnosis not present

## 2016-08-05 DIAGNOSIS — G894 Chronic pain syndrome: Secondary | ICD-10-CM | POA: Diagnosis not present

## 2016-08-05 DIAGNOSIS — M7061 Trochanteric bursitis, right hip: Secondary | ICD-10-CM | POA: Diagnosis not present

## 2016-08-08 DIAGNOSIS — W540XXA Bitten by dog, initial encounter: Secondary | ICD-10-CM | POA: Insufficient documentation

## 2016-08-08 DIAGNOSIS — M25551 Pain in right hip: Secondary | ICD-10-CM | POA: Diagnosis not present

## 2016-08-08 DIAGNOSIS — Z96641 Presence of right artificial hip joint: Secondary | ICD-10-CM | POA: Diagnosis not present

## 2016-08-08 DIAGNOSIS — S0185XA Open bite of other part of head, initial encounter: Secondary | ICD-10-CM | POA: Diagnosis not present

## 2016-08-08 DIAGNOSIS — S76311D Strain of muscle, fascia and tendon of the posterior muscle group at thigh level, right thigh, subsequent encounter: Secondary | ICD-10-CM | POA: Diagnosis not present

## 2016-08-08 DIAGNOSIS — M7601 Gluteal tendinitis, right hip: Secondary | ICD-10-CM | POA: Diagnosis not present

## 2016-08-11 DIAGNOSIS — Z96641 Presence of right artificial hip joint: Secondary | ICD-10-CM | POA: Diagnosis not present

## 2016-08-11 DIAGNOSIS — M25551 Pain in right hip: Secondary | ICD-10-CM | POA: Diagnosis not present

## 2016-08-11 DIAGNOSIS — S76311D Strain of muscle, fascia and tendon of the posterior muscle group at thigh level, right thigh, subsequent encounter: Secondary | ICD-10-CM | POA: Diagnosis not present

## 2016-08-11 DIAGNOSIS — M7601 Gluteal tendinitis, right hip: Secondary | ICD-10-CM | POA: Diagnosis not present

## 2016-08-12 DIAGNOSIS — S0185XD Open bite of other part of head, subsequent encounter: Secondary | ICD-10-CM | POA: Diagnosis not present

## 2016-08-22 DIAGNOSIS — L905 Scar conditions and fibrosis of skin: Secondary | ICD-10-CM | POA: Diagnosis not present

## 2016-08-22 DIAGNOSIS — S0185XD Open bite of other part of head, subsequent encounter: Secondary | ICD-10-CM | POA: Diagnosis not present

## 2016-08-23 DIAGNOSIS — K589 Irritable bowel syndrome without diarrhea: Secondary | ICD-10-CM | POA: Diagnosis not present

## 2016-08-23 DIAGNOSIS — K529 Noninfective gastroenteritis and colitis, unspecified: Secondary | ICD-10-CM | POA: Diagnosis not present

## 2016-08-23 DIAGNOSIS — I10 Essential (primary) hypertension: Secondary | ICD-10-CM | POA: Diagnosis not present

## 2016-08-24 DIAGNOSIS — K589 Irritable bowel syndrome without diarrhea: Secondary | ICD-10-CM | POA: Diagnosis not present

## 2016-08-26 DIAGNOSIS — K529 Noninfective gastroenteritis and colitis, unspecified: Secondary | ICD-10-CM | POA: Diagnosis not present

## 2016-08-27 ENCOUNTER — Other Ambulatory Visit: Payer: Self-pay | Admitting: Interventional Cardiology

## 2016-09-08 ENCOUNTER — Other Ambulatory Visit: Payer: Self-pay | Admitting: Interventional Cardiology

## 2016-09-08 DIAGNOSIS — G894 Chronic pain syndrome: Secondary | ICD-10-CM | POA: Diagnosis not present

## 2016-09-08 DIAGNOSIS — M791 Myalgia: Secondary | ICD-10-CM | POA: Diagnosis not present

## 2016-09-08 DIAGNOSIS — M4726 Other spondylosis with radiculopathy, lumbar region: Secondary | ICD-10-CM | POA: Diagnosis not present

## 2016-09-08 MED ORDER — ATORVASTATIN CALCIUM 40 MG PO TABS: ORAL_TABLET | ORAL | 2 refills | Status: DC

## 2016-09-08 MED ORDER — METOPROLOL SUCCINATE ER 50 MG PO TB24: 50.0000 mg | ORAL_TABLET | Freq: Every day | ORAL | 2 refills | Status: DC

## 2016-09-08 NOTE — Addendum Note (Signed)
Addended by: Derl Barrow on: 09/08/2016 10:46 AM   Modules accepted: Orders

## 2016-10-06 DIAGNOSIS — G894 Chronic pain syndrome: Secondary | ICD-10-CM | POA: Diagnosis not present

## 2016-10-26 ENCOUNTER — Other Ambulatory Visit: Payer: Self-pay | Admitting: Vascular Surgery

## 2016-11-07 DIAGNOSIS — G894 Chronic pain syndrome: Secondary | ICD-10-CM | POA: Diagnosis not present

## 2016-11-07 DIAGNOSIS — M791 Myalgia: Secondary | ICD-10-CM | POA: Diagnosis not present

## 2016-11-07 DIAGNOSIS — Z79899 Other long term (current) drug therapy: Secondary | ICD-10-CM | POA: Diagnosis not present

## 2016-11-07 DIAGNOSIS — Z79891 Long term (current) use of opiate analgesic: Secondary | ICD-10-CM | POA: Diagnosis not present

## 2016-11-07 DIAGNOSIS — M545 Low back pain: Secondary | ICD-10-CM | POA: Diagnosis not present

## 2016-11-10 ENCOUNTER — Encounter: Payer: Self-pay | Admitting: Family

## 2016-11-10 DIAGNOSIS — I739 Peripheral vascular disease, unspecified: Secondary | ICD-10-CM | POA: Diagnosis not present

## 2016-11-10 DIAGNOSIS — I999 Unspecified disorder of circulatory system: Secondary | ICD-10-CM | POA: Diagnosis not present

## 2016-11-11 ENCOUNTER — Encounter: Payer: Self-pay | Admitting: Family

## 2016-11-11 ENCOUNTER — Ambulatory Visit (INDEPENDENT_AMBULATORY_CARE_PROVIDER_SITE_OTHER): Payer: Medicare Other | Admitting: Family

## 2016-11-11 ENCOUNTER — Ambulatory Visit (HOSPITAL_COMMUNITY)
Admission: RE | Admit: 2016-11-11 | Discharge: 2016-11-11 | Disposition: A | Discharge status: Home / Self Care | Payer: Medicare Other | Source: Ambulatory Visit | Attending: Family | Admitting: Family

## 2016-11-11 VITALS — BP 132/87 | HR 56 | Temp 97.8°F | Resp 18 | Ht 72.0 in | Wt 227.0 lb

## 2016-11-11 DIAGNOSIS — Z959 Presence of cardiac and vascular implant and graft, unspecified: Secondary | ICD-10-CM | POA: Diagnosis not present

## 2016-11-11 DIAGNOSIS — I779 Disorder of arteries and arterioles, unspecified: Secondary | ICD-10-CM | POA: Diagnosis not present

## 2016-11-11 NOTE — Patient Instructions (Signed)

## 2016-11-11 NOTE — Progress Notes (Signed)
[facility] [place]   CC: Follow up peripheral artery occlusive disease  History of Present Illness Richard Parrish is a 66 y.o. male patient of Dr. Trula Slade who is s/p left SFA and popliteal atherectomy and stent placement on 04/13/11 for left calf claudication.  He returns today earlier than his one year follow up at the request of Dr. Delfina Redwood for possible claudication symptoms.  He was walking up an incline about 2 weeks ago, on a hot day, his legs felt very heavy after walking 100 yards on the incline.  A few days later, using an elliptical machine, the anterior aspect of his right thigh tightened severely.   Back and hip issues that are being evaluated and treated by Dr. Clearance Coots and [facility] PA-C.  He states he was diagnosed with IT band syndrome (pain in lower back).  He is receiving physical therapy for the above.   He previously had total resolution of left calf claudication, denies non healing wounds. He denies any history of stroke or TIA. He has several cardiac stents and CABG in 1989 of the right RCA.  He reports that he had pulmonary fibrosis a few years ago that resolved with a course of corticosteroids.  He reports mild dyspnea with mild exertion for the last 1-2 months. H denies chest pain.    Pt Diabetic: No Pt smoker: former smoker, quit in 1989  Pt meds include: Statin :Yes Betablocker: Yes ASA: Yes Other anticoagulants/antiplatelets: Plavix    Past Medical History:  Diagnosis Date  . Anxiety   . BPH (benign prostatic hypertrophy)   . C. difficile diarrhea   . CAD (coronary artery disease)   . Coronary artery disease 1989   cabg with dvg to rca   . Depression   . GERD (gastroesophageal reflux disease)   . Hyperlipidemia   . Hypertension   . Insomnia   . Leg pain   . Osteoarthritis    hip  . Peripheral vascular disease ([facility])   . Pulmonary fibrosis ([place])   . Sleep apnea    cpap setting of 60 per pt  . Spinal  stenosis     Social History Social History  Substance Use Topics  . Smoking status: Former Smoker    Packs/day: 2.00    Years: 15.00    Types: Cigarettes    Quit date: 03/01/1987  . Smokeless tobacco: Never Used  . Alcohol use Yes     Comment: weekly    Family History Family History  Problem Relation Age of Onset  . Heart disease Mother   . Cancer Father     Past Surgical History:  Procedure Laterality Date  . ABDOMINAL AORTAGRAM N/A 03/15/2011   Procedure: ABDOMINAL Maxcine Ham;  Surgeon: Serafina Mitchell, MD;  Location: [facility] CATH LAB;  Service: Cardiovascular;  Laterality: N/A;  . ABDOMINAL AORTAGRAM N/A 04/12/2011   Procedure: ABDOMINAL Maxcine Ham;  Surgeon: Serafina Mitchell, MD;  Location: [facility] CATH LAB;  Service: Cardiovascular;  Laterality: N/A;  . ABDOMINAL SURGERY  04/12/11   Abdominal Angiogram  . BACK SURGERY     lumbar fusion, S L 5 to L 4  . CORONARY ARTERY BYPASS GRAFT     x 1  . JOINT REPLACEMENT     right hip  . RCA AND RCA STENTS  04/12/11   Left   . rca stent    . rotator cuff surgery Right 2006    Allergies  Allergen Reactions  . Ambien [Zolpidem Tartrate]  Other (See Comments)    Bad dreams  . Zolpidem Tartrate Other (See Comments)    Vivid dreams    Current Outpatient Prescriptions  Medication Sig Dispense Refill  . aspirin 81 MG tablet Take 81 mg by mouth every morning.     Marland Kitchen atorvastatin (LIPITOR) 40 MG tablet TAKE 1 TABLET (40 MG TOTAL) BY MOUTH DAILY. 90 tablet 2  . clopidogrel (PLAVIX) 75 MG tablet TAKE 1 TABLET BY MOUTH EVERY DAY 90 tablet 2  . HYDROcodone-acetaminophen (NORCO) 7.5-325 MG tablet Take 1 tablet by mouth every 6 (six) hours as needed.     . hyoscyamine (LEVSIN, ANASPAZ) 0.125 MG tablet Take 0.125 mg by mouth every 4 (four) hours as needed for cramping.     . metoprolol succinate (TOPROL-XL) 50 MG 24 hr tablet Take 1 tablet (50 mg total) by mouth daily. 90 tablet 2  . Multiple Vitamin (MULTIVITAMIN) tablet Take 1 tablet by mouth every  morning.     . Omega-3 Fatty Acids (FISH OIL) 1000 MG CAPS Take 1,000 mg by mouth every morning.     . Omeprazole Magnesium (PRILOSEC OTC PO) Take 1 tablet by mouth daily.    Marland Kitchen PARoxetine (PAXIL) 40 MG tablet Take 40 mg by mouth daily.  6  . tamsulosin (FLOMAX) 0.4 MG CAPS capsule Take 1 capsule by mouth every morning.  5  . tiZANidine (ZANAFLEX) 2 MG tablet TAKE 1 TABLET BY MOUTH EVERY 6 HOURS AS NEEDED FOR SPASM  2  . valsartan (DIOVAN) 320 MG tablet TAKE 1 TABLET BY MOUTH EVERY DAY 30 tablet 9  . acetaminophen (TYLENOL) 500 MG tablet Take 500 mg by mouth every 6 (six) hours as needed.    . Calcium Carbonate-Vitamin D (CALCIUM + D PO) Take 1 tablet by mouth every morning.     . Coenzyme Q10 (COQ10 PO) Take 1 capsule by mouth daily.    . nitroGLYCERIN (NITRODUR - DOSED IN MG/24 HR) 0.2 mg/hr patch Use 1/4 patch daily to the affected area (Patient not taking: Reported on 11/11/2016) 30 patch 1  . nitroGLYCERIN (NITROSTAT) 0.4 MG SL tablet Place 0.4 mg under the tongue every 5 (five) minutes as needed for chest pain.     No current facility-administered medications for this visit.     ROS: See HPI for pertinent positives and negatives.   Physical Examination  Vitals:   11/11/16 1126  BP: 132/87  Pulse: (!) 56  Resp: 18  Temp: 97.8 F (36.6 C)  TempSrc: Oral  SpO2: 96%  Weight: 227 lb (103 kg)  Height: 6' (1.829 m)   Body mass index is 30.79 kg/m.  General: A&O x 3, WDWN. Gait: normal Eyes: PERRLA. Pulmonary: Respirations are non labored, scattered rales in left posterior fields (lower half), diminished air movement in right posterior fields.  Cardiac: regular rythm, no detected murmur.    Carotid Bruits Right Left   Negative Negative   Aorta is not palpable. Radial pulses: 2+ palpable and =   VASCULAR EXAM: Extremitieswithoutischemic changes, withoutGangrene; withoutopen wounds.  LE Pulses Right Left  FEMORAL 2+  palpable 2+ palpable  POPLITEAL not palpable 2+ palpable  POSTERIOR TIBIAL 1+ palpable 1+ palpable  DORSALIS PEDIS ANTERIOR TIBIAL 2+ palpable 2+ palpable   Abdomen: soft, NT, no palpable masses. Skin: no rashes, no ulcers. Musculoskeletal: no muscle wasting or atrophy. Neurologic: A&O X 3; Appropriate Affect ; SENSATION: normal; MOTOR FUNCTION: moving all extremities equally, motor strength 5/5 throughout. Speech is fluent/normal. CN 2-12 intact. Loquacious. Straight  leg raise test against resistance: mild pain in right hip with resistance against right leg raise, and mild left groin pain with resistance applied to left straight leg raise.      ASSESSMENT: CLETO CLAGGETT is a 66 y.o. male who is s/p left SFA and politeal atherectomy and stent placement on 04/13/11 for left calf claudication.  The pain in his low back, one episode of tight feeling in right anterior thigh while exercising on an eliptical machine, and heavy feeling in both legs after walking 100 yards up an incline, are not due to lack of arterial perfusion, since bilateral ABI's demonstrate 100% arterial perfusion, and 100% arterial perfusion to both great toes.   His walking is limited by lumbar spine and hip pain which is under treatment. He also has left achilles tendon problems.  Straight leg raise test with resistance applied: mild pain in right hip, mild left groin pain.  MRI of spine in 2016 showed progressive disc and facet degeneration at L3-4, he has a hx of lumbar spine surgery.  He stopped smoking in 1989 and does not have DM.   I advised pt to let Dr. Delfina Redwood know about his mild shortness of breath with mild exertion in the last 1-2 months. I heard rales in his left posterior fields and diminished air movement in his right posterior lung fields on ausculation today.   DATA  ABI (Date: 11/11/2016):  R:   ABI: 1.19 (was 1.11 on 01-27-16),   PT: tri  DP: tri  TBI:  1.02    L:   ABI: 1.26 (was 1.21),   PT: tri  DP: tri  TBI: 1.05 ABI's and TBI's remain normal with all triphasic waveforms.   Left LE arterial duplex (01-27-16): Patent left SFA stent, no evidence of restenosis; no significant change compared to the last exam on 12/15/14.  MR of Spine w/o contrast on 10-21-14: Progressive disc and facet degeneration at L3-4. There is severe spinal stenosis as well as subarticular and foraminal encroachment bilaterally which also is severe.   PLAN:  Based on the patient's vascular studies and examination, pt will return to clinic in 1 year with ABI's.  He is receiving physical therapy for his back pain.   I discussed in depth with the patient the nature of atherosclerosis, and emphasized the importance of maximal medical management including strict control of blood pressure, blood glucose, and lipid levels, obtaining regular exercise, and continued cessation of smoking.  The patient is aware that without maximal medical management the underlying atherosclerotic disease process will progress, limiting the benefit of any interventions.  The patient was given information about PAD including signs, symptoms, treatment, what symptoms should prompt the patient to seek immediate medical care, and risk reduction measures to take.  Clemon Chambers, RN, MSN, FNP-C [facility] Phone: [phone]  Clinic MD: Donzetta Matters  11/11/16 11:47 AM

## 2016-11-23 DIAGNOSIS — M545 Low back pain: Secondary | ICD-10-CM | POA: Diagnosis not present

## 2016-11-24 NOTE — Addendum Note (Signed)
Addended by: Lianne Cure A on: 11/24/2016 04:54 PM   Modules accepted: Orders

## 2016-11-28 DIAGNOSIS — M545 Low back pain: Secondary | ICD-10-CM | POA: Diagnosis not present

## 2016-11-30 ENCOUNTER — Ambulatory Visit
Admission: RE | Admit: 2016-11-30 | Discharge: 2016-11-30 | Disposition: A | Discharge status: Home / Self Care | Payer: Medicare Other | Source: Ambulatory Visit | Attending: Internal Medicine | Admitting: Internal Medicine

## 2016-11-30 ENCOUNTER — Other Ambulatory Visit: Payer: Self-pay | Admitting: Internal Medicine

## 2016-11-30 DIAGNOSIS — M25552 Pain in left hip: Secondary | ICD-10-CM

## 2016-11-30 DIAGNOSIS — M1612 Unilateral primary osteoarthritis, left hip: Secondary | ICD-10-CM | POA: Diagnosis not present

## 2016-11-30 DIAGNOSIS — Z23 Encounter for immunization: Secondary | ICD-10-CM | POA: Diagnosis not present

## 2016-12-01 DIAGNOSIS — M545 Low back pain: Secondary | ICD-10-CM | POA: Diagnosis not present

## 2016-12-05 DIAGNOSIS — M791 Myalgia, unspecified site: Secondary | ICD-10-CM | POA: Diagnosis not present

## 2016-12-05 DIAGNOSIS — G894 Chronic pain syndrome: Secondary | ICD-10-CM | POA: Diagnosis not present

## 2016-12-05 DIAGNOSIS — M48062 Spinal stenosis, lumbar region with neurogenic claudication: Secondary | ICD-10-CM | POA: Diagnosis not present

## 2016-12-05 DIAGNOSIS — S83242D Other tear of medial meniscus, current injury, left knee, subsequent encounter: Secondary | ICD-10-CM | POA: Diagnosis not present

## 2016-12-07 DIAGNOSIS — M545 Low back pain: Secondary | ICD-10-CM | POA: Diagnosis not present

## 2016-12-12 DIAGNOSIS — M545 Low back pain: Secondary | ICD-10-CM | POA: Diagnosis not present

## 2016-12-19 DIAGNOSIS — M545 Low back pain: Secondary | ICD-10-CM | POA: Diagnosis not present

## 2016-12-26 DIAGNOSIS — M25552 Pain in left hip: Secondary | ICD-10-CM | POA: Diagnosis not present

## 2017-01-04 DIAGNOSIS — Z6831 Body mass index (BMI) 31.0-31.9, adult: Secondary | ICD-10-CM | POA: Diagnosis not present

## 2017-01-04 DIAGNOSIS — M791 Myalgia, unspecified site: Secondary | ICD-10-CM | POA: Diagnosis not present

## 2017-01-04 DIAGNOSIS — M48062 Spinal stenosis, lumbar region with neurogenic claudication: Secondary | ICD-10-CM | POA: Diagnosis not present

## 2017-01-04 DIAGNOSIS — G894 Chronic pain syndrome: Secondary | ICD-10-CM | POA: Diagnosis not present

## 2017-01-04 DIAGNOSIS — M545 Low back pain: Secondary | ICD-10-CM | POA: Diagnosis not present

## 2017-01-09 DIAGNOSIS — M545 Low back pain: Secondary | ICD-10-CM | POA: Diagnosis not present

## 2017-01-16 DIAGNOSIS — I739 Peripheral vascular disease, unspecified: Secondary | ICD-10-CM | POA: Diagnosis not present

## 2017-01-16 DIAGNOSIS — M545 Low back pain: Secondary | ICD-10-CM | POA: Diagnosis not present

## 2017-01-16 DIAGNOSIS — G4733 Obstructive sleep apnea (adult) (pediatric): Secondary | ICD-10-CM | POA: Diagnosis not present

## 2017-01-16 DIAGNOSIS — E663 Overweight: Secondary | ICD-10-CM | POA: Diagnosis not present

## 2017-01-16 DIAGNOSIS — F324 Major depressive disorder, single episode, in partial remission: Secondary | ICD-10-CM | POA: Diagnosis not present

## 2017-01-16 DIAGNOSIS — I2510 Atherosclerotic heart disease of native coronary artery without angina pectoris: Secondary | ICD-10-CM | POA: Diagnosis not present

## 2017-01-16 DIAGNOSIS — E7800 Pure hypercholesterolemia, unspecified: Secondary | ICD-10-CM | POA: Diagnosis not present

## 2017-01-16 DIAGNOSIS — F419 Anxiety disorder, unspecified: Secondary | ICD-10-CM | POA: Diagnosis not present

## 2017-01-16 DIAGNOSIS — G894 Chronic pain syndrome: Secondary | ICD-10-CM | POA: Diagnosis not present

## 2017-01-16 DIAGNOSIS — I10 Essential (primary) hypertension: Secondary | ICD-10-CM | POA: Diagnosis not present

## 2017-01-25 DIAGNOSIS — M545 Low back pain: Secondary | ICD-10-CM | POA: Diagnosis not present

## 2017-01-25 DIAGNOSIS — M791 Myalgia, unspecified site: Secondary | ICD-10-CM | POA: Diagnosis not present

## 2017-01-25 DIAGNOSIS — G894 Chronic pain syndrome: Secondary | ICD-10-CM | POA: Diagnosis not present

## 2017-01-30 ENCOUNTER — Ambulatory Visit: Payer: Medicare Other | Admitting: Family

## 2017-01-30 ENCOUNTER — Encounter (HOSPITAL_COMMUNITY): Payer: Medicare Other

## 2017-02-07 ENCOUNTER — Encounter (HOSPITAL_COMMUNITY): Payer: Self-pay

## 2017-02-07 ENCOUNTER — Other Ambulatory Visit: Payer: Self-pay

## 2017-02-07 ENCOUNTER — Emergency Department (HOSPITAL_COMMUNITY): Payer: Medicare Other

## 2017-02-07 ENCOUNTER — Emergency Department (HOSPITAL_COMMUNITY)
Admission: EM | Admit: 2017-02-07 | Discharge: 2017-02-07 | Disposition: A | Discharge status: Home / Self Care | Payer: Medicare Other | Attending: Emergency Medicine | Admitting: Emergency Medicine

## 2017-02-07 DIAGNOSIS — I2510 Atherosclerotic heart disease of native coronary artery without angina pectoris: Secondary | ICD-10-CM | POA: Diagnosis not present

## 2017-02-07 DIAGNOSIS — K529 Noninfective gastroenteritis and colitis, unspecified: Secondary | ICD-10-CM | POA: Insufficient documentation

## 2017-02-07 DIAGNOSIS — Z87891 Personal history of nicotine dependence: Secondary | ICD-10-CM | POA: Diagnosis not present

## 2017-02-07 DIAGNOSIS — R1011 Right upper quadrant pain: Secondary | ICD-10-CM | POA: Diagnosis not present

## 2017-02-07 DIAGNOSIS — E785 Hyperlipidemia, unspecified: Secondary | ICD-10-CM | POA: Diagnosis not present

## 2017-02-07 DIAGNOSIS — R197 Diarrhea, unspecified: Secondary | ICD-10-CM

## 2017-02-07 DIAGNOSIS — R5383 Other fatigue: Secondary | ICD-10-CM | POA: Insufficient documentation

## 2017-02-07 DIAGNOSIS — R0602 Shortness of breath: Secondary | ICD-10-CM | POA: Diagnosis not present

## 2017-02-07 DIAGNOSIS — I10 Essential (primary) hypertension: Secondary | ICD-10-CM | POA: Insufficient documentation

## 2017-02-07 DIAGNOSIS — Z79899 Other long term (current) drug therapy: Secondary | ICD-10-CM | POA: Diagnosis not present

## 2017-02-07 DIAGNOSIS — R1031 Right lower quadrant pain: Secondary | ICD-10-CM | POA: Insufficient documentation

## 2017-02-07 DIAGNOSIS — Z7982 Long term (current) use of aspirin: Secondary | ICD-10-CM | POA: Insufficient documentation

## 2017-02-07 LAB — C DIFFICILE QUICK SCREEN W PCR REFLEX
C Diff antigen: NEGATIVE
C Diff interpretation: NOT DETECTED
C Diff toxin: NEGATIVE

## 2017-02-07 LAB — COMPREHENSIVE METABOLIC PANEL WITH GFR
ALT: 24 U/L (ref 17–63)
AST: 24 U/L (ref 15–41)
Albumin: 4.1 g/dL (ref 3.5–5.0)
Alkaline Phosphatase: 80 U/L (ref 38–126)
Anion gap: 8 (ref 5–15)
BUN: 27 mg/dL — ABNORMAL HIGH (ref 6–20)
CO2: 20 mmol/L — ABNORMAL LOW (ref 22–32)
Calcium: 10.4 mg/dL — ABNORMAL HIGH (ref 8.9–10.3)
Chloride: 109 mmol/L (ref 101–111)
Creatinine, Ser: 1.29 mg/dL — ABNORMAL HIGH (ref 0.61–1.24)
GFR calc Af Amer: >60 mL/min (ref >60)
GFR calc non Af Amer: 56 mL/min — ABNORMAL LOW (ref >60)
Glucose, Bld: 105 mg/dL — ABNORMAL HIGH (ref 65–99)
Potassium: 3.9 mmol/L (ref 3.5–5.1)
Sodium: 137 mmol/L (ref 135–145)
Total Bilirubin: 0.6 mg/dL (ref 0.3–1.2)
Total Protein: 7 g/dL (ref 6.5–8.1)

## 2017-02-07 LAB — CBC
HEMATOCRIT: 42.7 % (ref 39.0–52.0)
HEMOGLOBIN: 15.3 g/dL (ref 13.0–17.0)
MCH: 32.8 pg (ref 26.0–34.0)
MCHC: 35.8 g/dL (ref 30.0–36.0)
MCV: 91.6 fL (ref 78.0–100.0)
Platelets: 359 10*3/uL (ref 150–400)
RBC: 4.66 MIL/uL (ref 4.22–5.81)
RDW: 13.1 % (ref 11.5–15.5)
WBC: 11.2 10*3/uL — ABNORMAL HIGH (ref 4.0–10.5)

## 2017-02-07 LAB — POC OCCULT BLOOD, ED: Fecal Occult Bld: POSITIVE — AB

## 2017-02-07 LAB — I-STAT CG4 LACTIC ACID, ED: Lactic Acid, Venous: 1.02 mmol/L (ref 0.5–1.9)

## 2017-02-07 LAB — LIPASE, BLOOD: Lipase: 23 U/L (ref 11–51)

## 2017-02-07 MED ORDER — SODIUM CHLORIDE 0.9 % IV BOLUS (SEPSIS)
1000.0000 mL | Freq: Once | INTRAVENOUS | Status: AC
  Administered 2017-02-07: 1000 mL via INTRAVENOUS

## 2017-02-07 MED ORDER — CIPROFLOXACIN HCL 500 MG PO TABS: 500.0000 mg | ORAL_TABLET | Freq: Two times a day (BID) | ORAL | 0 refills | Status: AC

## 2017-02-07 MED ORDER — CIPROFLOXACIN HCL 500 MG PO TABS
500.0000 mg | ORAL_TABLET | Freq: Once | ORAL | Status: AC
  Administered 2017-02-07: 500 mg via ORAL
  Filled 2017-02-07: qty 1

## 2017-02-07 MED ORDER — IOPAMIDOL (ISOVUE-300) INJECTION 61%
INTRAVENOUS | Status: AC
  Administered 2017-02-07: 100 mL
  Filled 2017-02-07: qty 100

## 2017-02-07 NOTE — ED Notes (Signed)
Pt reports history of c.diff. This year. He states that he was on antibiotics recently after a root canal and that is when his symptoms started.

## 2017-02-07 NOTE — Discharge Instructions (Signed)
Please read and follow all provided instructions.  Your diagnoses today include:  1. Enteritis   2. Diarrhea, unspecified type     Tests performed today include: Vital signs. See below for your results today.   Medications prescribed:  Take as prescribed   Home care instructions:  Follow any educational materials contained in this packet.  Follow-up instructions: Please follow-up with your primary care provider for further evaluation of symptoms and treatment   Return instructions:  Please return to the Emergency Department if you do not get better, if you get worse, or new symptoms OR  - Fever (temperature greater than 101.55F)  - Bleeding that does not stop with holding pressure to the area    -Severe pain (please note that you may be more sore the day after your accident)  - Chest Pain  - Difficulty breathing  - Severe nausea or vomiting  - Inability to tolerate food and liquids  - Passing out  - Skin becoming red around your wounds  - Change in mental status (confusion or lethargy)  - New numbness or weakness    Please return if you have any other emergent concerns.  Additional Information:  Your vital signs today were: BP 135/76    Pulse 67    Temp (!) 97.5 F (36.4 C) (Oral)    Resp 16    Ht 6' (1.829 m)    Wt 102.1 kg (225 lb)    SpO2 98%    BMI 30.52 kg/m  If your blood pressure (BP) was elevated above 135/85 this visit, please have this repeated by your doctor within one month. ---------------

## 2017-02-07 NOTE — ED Notes (Signed)
Patient stated he has a doctors appt for another issue at 1300 today, stated that he was going to leave to try and make that appt by 1240. Pt advised that if he left he would have to be taken out of our system and check back in if he returned, pt also made aware that we are working to get him back to be seen by a doctor and he should not have to wait too much longer but that he will most likely still not be out of the ED in time for his next appt, pt encouraged to stay and be seen here in the ED. Pt verbalized understanding and stated he felt like the issue he was here for was more important, agreed to stay and be seen.

## 2017-02-07 NOTE — ED Triage Notes (Signed)
Pt states he has had diarrhea for 1 week. He reports taking amoxicillin for abscess tooth. Pt has hx of cdiff. Reports he had watery stool every hour last night. Pt reports feeling fatigued and dehydrated.

## 2017-02-07 NOTE — ED Provider Notes (Signed)
  Physical Exam  BP 107/75   Pulse 67   Temp (!) 97.5 F (36.4 C) (Oral)   Resp 18   Ht 6' (1.829 m)   Wt 102.1 kg (225 lb)   SpO2 100%   BMI 30.52 kg/m   Physical Exam  ED Course/Procedures     Procedures  MDM    3:08 PM Sign out Rodell Perna, PA-C  Per previous provider HPI Richard Parrish is a 66 y.o. male with history of CAD, GERD, HLD, HTN, PVD presents today with chief complaint acute onset, progressively worsening diarrhea for 1 week.  He states he was on amoxicillin for approximately 10-14 days due to a dental abscess but he stopped taking this medication when his diarrhea developed.  He has had 1 week of progressively worsening black watery stools.  He denies melena.  Has had a history of C. difficile in the past but states his stools look a little bit different than when he had C. Difficile and are not "mucousy".  He notes feeling of abdominal bloating and distention.  He denies nausea, vomiting, abdominal pain, or chest pain.  He notes dyspnea on exertion which developed over the past couple of days which he attributes to dehydration.  He states he feels generally weak.  He has tried Pepto-Bismol, hyoscyamine, and Imodium without significant relief of his symptoms.  He notes 1 day of subjective fevers and chills which resolved.  Pending CT Abdomen/Pelvis unremarkable. C Diff Panel negative. Stool studies sent. Does have positive stool, but hemodynamically stable. Hgb WNL. Will DC patient home with follow up to Gastroenterology. Discussed with attending physician. Due to duration of symptoms, will Rx Cipro BID for 5 days with follow up. Discussed with patient .Strict return precautions given.   CT ABDOMEN AND PELVIS WITH CONTRAST    TECHNIQUE:  Multidetector CT imaging of the abdomen and pelvis was performed  using the standard protocol following bolus administration of  intravenous contrast.    CONTRAST: 165mL ISOVUE-300 IOPAMIDOL (ISOVUE-300) INJECTION 61%     COMPARISON: Chest CT 07/18/2013    FINDINGS:  Lower chest: Interlobular septal thickening at the lung bases  unchanged from comparison exam. Trace effusions/pleural thickening    Hepatobiliary: No focal hepatic lesion. Postcholecystectomy. No  biliary dilatation.    Pancreas: Pancreas is normal. No ductal dilatation. No pancreatic  inflammation.    Spleen: Normal spleen    Adrenals/urinary tract: Adrenal glands and kidneys are normal. The  ureters and bladder normal.    Stomach/Bowel: Stomach, small-bowel cecum normal. Post appendectomy.  Colon and rectosigmoid colon normal.    Vascular/Lymphatic: Abdominal aorta is normal caliber with  atherosclerotic calcification. There is no retroperitoneal or  periportal lymphadenopathy. No pelvic lymphadenopathy.    Reproductive: Prostate normal    Other: No free fluid.    Musculoskeletal: No aggressive osseous lesion.    IMPRESSION:  1. No acute findings in the abdomen pelvis.  2. Chronic interstitial thickening at the lung bases.  3. Aortic Atherosclerosis (ICD10-I70.0).          Shary Decamp, PA-C 02/07/17 1727    Julianne Rice, MD 02/08/17 1005

## 2017-02-07 NOTE — ED Notes (Signed)
Patient transported to CT 

## 2017-02-07 NOTE — ED Provider Notes (Signed)
[facility]ERGENCY DEPARTMENT Provider Note   CSN: [id number] Arrival date & time: 02/07/17  1029     History   Chief Complaint Chief Complaint  Patient presents with  . Diarrhea    HPI Richard Parrish is a 66 y.o. male with history of CAD, GERD, HLD, HTN, PVD presents today with chief complaint acute onset, progressively worsening diarrhea for 1 week.  He states he was on amoxicillin for approximately 10-14 days due to a dental abscess but he stopped taking this medication when his diarrhea developed.  He has had 1 week of progressively worsening black watery stools.  He denies melena.  Has had a history of C. difficile in the past but states his stools look a little bit different than when he had C. Difficile and are not "mucousy".  He notes feeling of abdominal bloating and distention.  He denies nausea, vomiting, abdominal pain, or chest pain.  He notes dyspnea on exertion which developed over the past couple of days which he attributes to dehydration.  He states he feels generally weak.  He has tried Pepto-Bismol, hyoscyamine, and Imodium without significant relief of his symptoms.  He notes 1 day of subjective fevers and chills which resolved.  The history is provided by the patient.    Past Medical History:  Diagnosis Date  . Anxiety   . BPH (benign prostatic hypertrophy)   . C. difficile diarrhea   . CAD (coronary artery disease)   . Coronary artery disease 1989   cabg with dvg to rca   . Depression   . GERD (gastroesophageal reflux disease)   . Hyperlipidemia   . Hypertension   . Insomnia   . Leg pain   . Osteoarthritis    hip  . Peripheral vascular disease ([place])   . Pulmonary fibrosis ([place])   . Sleep apnea    cpap setting of 60 per pt  . Spinal stenosis     Patient Active Problem List   Diagnosis Date Noted  . Right hip pain 06/01/2016  . Achilles tendon pain 12/31/2015  . 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 Cough  07/24/2013  . CAD (coronary artery disease), native coronary artery 04/01/2013  . Essential hypertension, benign 12/12/2012    Class: Chronic  . Other and unspecified hyperlipidemia 12/12/2012  . Atherosclerosis of native arteries of the extremities with intermittent claudication 11/25/2011  . Peripheral vascular disease, unspecified ([place]) 02/28/2011    Past Surgical History:  Procedure Laterality Date  . ABDOMINAL AORTAGRAM N/A 03/15/2011   Procedure: ABDOMINAL Maxcine Ham;  Surgeon: Serafina Mitchell, MD;  Location: [facility] CATH LAB;  Service: Cardiovascular;  Laterality: N/A;  . ABDOMINAL AORTAGRAM N/A 04/12/2011   Procedure: ABDOMINAL Maxcine Ham;  Surgeon: Serafina Mitchell, MD;  Location: [facility] CATH LAB;  Service: Cardiovascular;  Laterality: N/A;  . ABDOMINAL SURGERY  04/12/11   Abdominal Angiogram  . BACK SURGERY     lumbar fusion, S L 5 to L 4  . CORONARY ARTERY BYPASS GRAFT     x 1  . JOINT REPLACEMENT     right hip  . RCA AND RCA STENTS  04/12/11   Left   . rca stent    . rotator cuff surgery Right 2006       Home Medications    Prior to Admission medications   Medication Sig Start Date End Date Taking? Authorizing Provider  acetaminophen (TYLENOL) 500 MG tablet Take 500 mg by mouth every 6 (six) hours as  needed.   Yes [provider]  aspirin 81 MG tablet Take 81 mg by mouth every morning.    Yes [provider]  atorvastatin (LIPITOR) 40 MG tablet TAKE 1 TABLET (40 MG TOTAL) BY MOUTH DAILY. 09/08/16  Yes Belva Crome, MD  Calcium Carbonate-Vitamin D (CALCIUM + D PO) Take 1 tablet by mouth every morning.    Yes [provider]  clopidogrel (PLAVIX) 75 MG tablet TAKE 1 TABLET BY MOUTH EVERY DAY 10/26/16  Yes Angelia Mould, MD  glucosamine-chondroitin 500-400 MG tablet Take 1 tablet by mouth daily.   Yes [provider]  HYDROcodone-acetaminophen (NORCO) 7.5-325 MG tablet Take 1 tablet by mouth every 6 (six) hours as needed.  01/18/16  Yes  [provider]  hyoscyamine (LEVSIN, ANASPAZ) 0.125 MG tablet Take 0.125 mg by mouth every 4 (four) hours as needed for cramping.    Yes [provider]  metoprolol succinate (TOPROL-XL) 50 MG 24 hr tablet Take 1 tablet (50 mg total) by mouth daily. 09/08/16  Yes Belva Crome, MD  Multiple Vitamin (MULTIVITAMIN) tablet Take 1 tablet by mouth every morning.    Yes [provider]  Omega-3 Fatty Acids (FISH OIL) 1000 MG CAPS Take 1,000 mg by mouth every morning.    Yes [provider]  Omeprazole Magnesium (PRILOSEC OTC PO) Take 1 tablet by mouth daily.   Yes [provider]  PARoxetine (PAXIL) 40 MG tablet Take 40 mg by mouth daily. 01/05/15  Yes [provider]  tamsulosin (FLOMAX) 0.4 MG CAPS capsule Take 1 capsule by mouth every morning. 08/18/14  Yes [provider]  valsartan (DIOVAN) 320 MG tablet TAKE 1 TABLET BY MOUTH EVERY DAY 08/29/16  Yes Belva Crome, MD  nitroGLYCERIN (NITRODUR - DOSED IN MG/24 HR) 0.2 mg/hr patch Use 1/4 patch daily to the affected area Patient not taking: Reported on 11/11/2016 08/10/15   Thurman Coyer, DO  nitroGLYCERIN (NITROSTAT) 0.4 MG SL tablet Place 0.4 mg under the tongue every 5 (five) minutes as needed for chest pain.    [provider]    Family History Family History  Problem Relation Age of Onset  . Heart disease Mother   . Cancer Father     Social History Social History   Tobacco Use  . Smoking status: Former Smoker    Packs/day: 2.00    Years: 15.00    Pack years: 30.00    Types: Cigarettes    Last attempt to quit: 03/01/1987    Years since quitting: 29.9  . Smokeless tobacco: Never Used  Substance Use Topics  . Alcohol use: Yes    Comment: weekly  . Drug use: No     Allergies   Ambien [zolpidem tartrate] and Zolpidem tartrate   Review of Systems Review of Systems  Constitutional: Positive for chills (resolved), fatigue and fever (resolved).  Respiratory:  Positive for shortness of breath. Negative for cough.   Cardiovascular: Negative for chest pain.  Gastrointestinal: Positive for diarrhea. Negative for abdominal pain, blood in stool, constipation, nausea and vomiting.  All other systems reviewed and are negative.    Physical Exam Updated Vital Signs BP 107/75   Pulse 67   Temp (!) 97.5 F (36.4 C) (Oral)   Resp 18   Ht 6' (1.829 m)   Wt 102.1 kg (225 lb)   SpO2 100%   BMI 30.52 kg/m   Physical Exam  Constitutional: He appears well-developed and well-nourished. No distress.  HENT:  Head: Normocephalic and atraumatic.  Eyes: Conjunctivae are normal. Right eye exhibits no discharge. Left eye exhibits no discharge.  Neck: Normal range of motion. Neck supple. No JVD present. No tracheal deviation present.  Cardiovascular: Normal rate and regular rhythm.  Pulmonary/Chest: Effort normal and breath sounds normal.  Abdominal: Soft. He exhibits distension. There is generalized tenderness. There is no rigidity, no rebound, no guarding, no CVA tenderness, no tenderness at McBurney's point and negative Murphy's sign.  Tympanic to percussion on the right side.  Abdominal pain is generalized but worse in the right upper and lower quadrants.  Murphy sign absent, Rovsing's absent, no TTP at McBurney's point, no CVA tenderness  Genitourinary:  Genitourinary Comments: Examination performed in the presence of a chaperone. No frank rectal bleeding noted. No external hemorrhoids, tears, or fissures noted. No tenderness or fluctuance noted.   Musculoskeletal: Normal range of motion. He exhibits no edema.  Neurological: He is alert.  Skin: Skin is warm and dry. No erythema.  Psychiatric: He has a normal mood and affect. His behavior is normal.  Nursing note and vitals reviewed.    ED Treatments / Results  Labs (all labs ordered are listed, but only abnormal results are displayed) Labs Reviewed  COMPREHENSIVE METABOLIC PANEL - Abnormal; Notable  for the following components:      Result Value   CO2 20 (*)    Glucose, Bld 105 (*)    BUN 27 (*)    Creatinine, Ser 1.29 (*)    Calcium 10.4 (*)    GFR calc non Af Amer 56 (*)    All other components within normal limits  CBC - Abnormal; Notable for the following components:   WBC 11.2 (*)    All other components within normal limits  POC OCCULT BLOOD, ED - Abnormal; Notable for the following components:   Fecal Occult Bld POSITIVE (*)    All other components within normal limits  GASTROINTESTINAL PANEL BY PCR, STOOL (REPLACES STOOL CULTURE)  C DIFFICILE QUICK SCREEN W PCR REFLEX  LIPASE, BLOOD  URINALYSIS, ROUTINE W REFLEX MICROSCOPIC  I-STAT CG4 LACTIC ACID, ED  I-STAT CG4 LACTIC ACID, ED    EKG  EKG Interpretation None       Radiology No results found.  Procedures Procedures (including critical care time)  Medications Ordered in ED Medications  sodium chloride 0.9 % bolus 1,000 mL (1,000 mLs Intravenous New Bag/Given 02/07/17 1519)     Initial Impression / Assessment and Plan / ED Course  I have reviewed the triage vital signs and the nursing notes.  Pertinent labs & imaging results that were available during my care of the patient were reviewed by me and considered in my medical decision making (see chart for details).     Patient with progressively worsening diarrhea for 1 week.  Afebrile, vital signs are stable.  On examination, abdomen is distended and tympanic to percussion.  Mild nonspecific leukocytosis.  CMP consistent with dehydration with a slight bump in creatinine.  He is Hemoccult positive but hemodynamically stable and his diarrhea may be contributing to this. Obtained stool sample for testing. Awaiting CT scan for further workup with concern for C. Diff related complications.   3:08PM Signed out to oncoming provider PA Mohr.  Awaiting CT scan.  If no concerning findings, patient stable for outpatient treatment of what is likely C. difficile.   If patient becomes unstable or if there are concerning findings on his imaging he may require further workup and  admission.   Final Clinical Impressions(s) / ED Diagnoses   Final diagnoses:  None    ED Discharge Orders    None       Debroah Baller 02/08/17 7255    Julianne Rice, MD 02/11/17 1123

## 2017-02-08 LAB — GASTROINTESTINAL PANEL BY PCR, STOOL (REPLACES STOOL CULTURE)

## 2017-02-10 DIAGNOSIS — R197 Diarrhea, unspecified: Secondary | ICD-10-CM | POA: Diagnosis not present

## 2017-02-14 ENCOUNTER — Telehealth: Payer: Self-pay

## 2017-02-14 DIAGNOSIS — M25552 Pain in left hip: Secondary | ICD-10-CM | POA: Diagnosis not present

## 2017-02-14 NOTE — Telephone Encounter (Signed)
   [facility] Pre-operative Risk Assessment    Request for surgical clearance:  1. What type of surgery is being performed? Left Total Hip Replacement   2. When is this surgery scheduled? Pending on [place]   3. Are there any medications that need to be held prior to surgery and how long? Okay to stop Plavix and ASA 81 mg, no number of days listed   4. Practice name and name of physician performing surgery? [facility], Dr. Earlie Server   5. What is your office phone and fax number? Phone [phone] ext [phone] Fax [phone] ATTN: Claiborne Billings   6. Anesthesia type (None, local, MAC, general) ? Unknown   Michaelyn Barter 02/14/2017, 1:18 PM  _________________________________________________________________   Please send most recent notes, labs, EKG, or Special studies back with this clearance.

## 2017-02-14 NOTE — Telephone Encounter (Signed)
   Primary Cardiologist: Sinclair Grooms, MD  Chart reviewed as part of pre-operative protocol coverage. Patient was contacted 02/14/2017 in reference to pre-operative risk assessment for pending surgery as outlined below.  Richard Parrish was last seen on 5.24.2018 by Tamala Julian.  I have called patient to assess clinical status and have left a message on his voicemail.    Murray Hodgkins, NP 02/14/2017, 4:15 PM

## 2017-02-15 NOTE — Telephone Encounter (Signed)
°  Follow Up  Returning call from yesterday. Please call.

## 2017-02-15 NOTE — Telephone Encounter (Signed)
Left message for the patient to call back.

## 2017-02-15 NOTE — Telephone Encounter (Signed)
   Primary Cardiologist: Sinclair Grooms, MD  Chart reviewed as part of pre-operative protocol coverage. Patient was contacted 02/15/2017 in reference to pre-operative risk assessment for pending surgery as outlined below.  GARNIE BORCHARDT was last seen on 07/21/2016 by Dr. Tamala Julian.  Since that day, VANDERBILT RANIERI has done well continuing to swim and workout in the gym without any significant exertional chest pain.  Therefore, based on ACC/AHA guidelines, the patient would be at acceptable risk for the planned procedure without further cardiovascular testing.   I will route this recommendation to the requesting party via Epic fax function and remove from pre-op pool.  Please call with questions. Will also forward to Dr. Tamala Julian as well. He can hold plavix for 5 days prior to the procedure, ideally he should proceed with surgery on aspirin, however if absolutely needed, he can temporarily hold aspirin for 5 days prior to surgery as well. He should restart on aspirin and plavix as soon as possible after the surgery.   [place], [place] 02/15/2017, 4:41 PM

## 2017-02-22 ENCOUNTER — Telehealth: Payer: Self-pay | Admitting: Interventional Cardiology

## 2017-02-22 NOTE — Telephone Encounter (Signed)
Received message in Pre-op call back that Richard Parrish has not received clearance. Per note clearance was faxed over already via Epic.  I will fax over the clearance note to Richard Parrish today.

## 2017-02-22 NOTE — Telephone Encounter (Signed)
New message     Patient called and [facility] states they have not received preop clearance back, please put Attn Claiborne Billings at [facility]

## 2017-03-06 DIAGNOSIS — M545 Low back pain: Secondary | ICD-10-CM | POA: Diagnosis not present

## 2017-03-06 DIAGNOSIS — M791 Myalgia, unspecified site: Secondary | ICD-10-CM | POA: Diagnosis not present

## 2017-03-06 DIAGNOSIS — G894 Chronic pain syndrome: Secondary | ICD-10-CM | POA: Diagnosis not present

## 2017-03-14 DIAGNOSIS — M25552 Pain in left hip: Secondary | ICD-10-CM | POA: Diagnosis not present

## 2017-03-15 ENCOUNTER — Ambulatory Visit: Payer: Self-pay | Admitting: Physician Assistant

## 2017-03-15 DIAGNOSIS — R197 Diarrhea, unspecified: Secondary | ICD-10-CM | POA: Diagnosis not present

## 2017-03-15 NOTE — H&P (Signed)
TOTAL HIP ADMISSION H&P  Patient is admitted for left total hip arthroplasty.  Subjective:  Chief Complaint: left hip pain  HPI: Richard Parrish, 67 y.o. male, has a history of pain and functional disability in the left hip(s) due to arthritis and patient has failed non-surgical conservative treatments for greater than 12 weeks to include NSAID's and/or analgesics, corticosteriod injections, use of assistive devices and activity modification.  Onset of symptoms was gradual starting >10 years ago with gradually worsening course since that time.The patient noted no past surgery on the left hip(s).  Patient currently rates pain in the left hip at 10 out of 10 with activity. Patient has night pain, worsening of pain with activity and weight bearing, pain that interfers with activities of daily living and pain with passive range of motion. Patient has evidence of periarticular osteophytes and joint space narrowing by imaging studies. This condition presents safety issues increasing the risk of falls.There is no current active infection.  Patient Active Problem List   Diagnosis Date Noted  . Right hip pain 06/01/2016  . Achilles tendon pain 12/31/2015  . 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 Cough 07/24/2013  . CAD (coronary artery disease), native coronary artery 04/01/2013  . Essential hypertension, benign 12/12/2012    Class: Chronic  . Other and unspecified hyperlipidemia 12/12/2012  . Atherosclerosis of native arteries of the extremities with intermittent claudication 11/25/2011  . Peripheral vascular disease, unspecified ([place]) 02/28/2011   Past Medical History:  Diagnosis Date  . Anxiety   . BPH (benign prostatic hypertrophy)   . C. difficile diarrhea   . CAD (coronary artery disease)   . Coronary artery disease 1989   cabg with dvg to rca   . Depression   . GERD (gastroesophageal reflux disease)   . Hyperlipidemia   . Hypertension   . Insomnia   . Leg pain   .  Osteoarthritis    hip  . Peripheral vascular disease ([place])   . Pulmonary fibrosis ([place])   . Sleep apnea    cpap setting of 60 per pt  . Spinal stenosis     Past Surgical History:  Procedure Laterality Date  . ABDOMINAL AORTAGRAM N/A 03/15/2011   Procedure: ABDOMINAL Maxcine Ham;  Surgeon: Serafina Mitchell, MD;  Location: [facility] CATH LAB;  Service: Cardiovascular;  Laterality: N/A;  . ABDOMINAL AORTAGRAM N/A 04/12/2011   Procedure: ABDOMINAL Maxcine Ham;  Surgeon: Serafina Mitchell, MD;  Location: [facility] CATH LAB;  Service: Cardiovascular;  Laterality: N/A;  . ABDOMINAL SURGERY  04/12/11   Abdominal Angiogram  . BACK SURGERY     lumbar fusion, S L 5 to L 4  . CORONARY ARTERY BYPASS GRAFT     x 1  . JOINT REPLACEMENT     right hip  . RCA AND RCA STENTS  04/12/11   Left   . rca stent    . rotator cuff surgery Right 2006    Current Outpatient Medications  Medication Sig Dispense Refill Last Dose  . acetaminophen (TYLENOL) 500 MG tablet Take 500 mg by mouth every 6 (six) hours as needed.   02/06/2017 at Unknown time  . aspirin 81 MG tablet Take 81 mg by mouth every morning.    02/06/2017 at Unknown time  . atorvastatin (LIPITOR) 40 MG tablet TAKE 1 TABLET (40 MG TOTAL) BY MOUTH DAILY. 90 tablet 2 02/06/2017 at Unknown time  . Calcium Carbonate-Vitamin D (CALCIUM + D PO) Take 1 tablet by mouth every morning.  02/06/2017 at Unknown time  . clopidogrel (PLAVIX) 75 MG tablet TAKE 1 TABLET BY MOUTH EVERY DAY 90 tablet 2 02/06/2017 at Unknown time  . glucosamine-chondroitin 500-400 MG tablet Take 1 tablet by mouth daily.   02/06/2017 at Unknown time  . HYDROcodone-acetaminophen (NORCO) 7.5-325 MG tablet Take 1 tablet by mouth every 6 (six) hours as needed.    02/07/2017 at Unknown time  . hyoscyamine (LEVSIN, ANASPAZ) 0.125 MG tablet Take 0.125 mg by mouth every 4 (four) hours as needed for cramping.    Past Week at Unknown time  . metoprolol succinate (TOPROL-XL) 50 MG 24 hr tablet Take 1 tablet (50 mg  total) by mouth daily. 90 tablet 2 02/06/2017 at 8a  . Multiple Vitamin (MULTIVITAMIN) tablet Take 1 tablet by mouth every morning.    02/06/2017 at Unknown time  . nitroGLYCERIN (NITRODUR - DOSED IN MG/24 HR) 0.2 mg/hr patch Use 1/4 patch daily to the affected area (Patient not taking: Reported on 11/11/2016) 30 patch 1 Not Taking at Unknown time  . nitroGLYCERIN (NITROSTAT) 0.4 MG SL tablet Place 0.4 mg under the tongue every 5 (five) minutes as needed for chest pain.   unk  . Omega-3 Fatty Acids (FISH OIL) 1000 MG CAPS Take 1,000 mg by mouth every morning.    02/06/2017 at Unknown time  . Omeprazole Magnesium (PRILOSEC OTC PO) Take 1 tablet by mouth daily.   02/06/2017 at Unknown time  . PARoxetine (PAXIL) 40 MG tablet Take 40 mg by mouth daily.  6 02/06/2017 at Unknown time  . tamsulosin (FLOMAX) 0.4 MG CAPS capsule Take 1 capsule by mouth every morning.  5 02/06/2017 at Unknown time  . valsartan (DIOVAN) 320 MG tablet TAKE 1 TABLET BY MOUTH EVERY DAY 30 tablet 9 02/06/2017 at Unknown time   No current facility-administered medications for this visit.    Allergies  Allergen Reactions  . Ambien [Zolpidem Tartrate] Other (See Comments)    Bad dreams  . Zolpidem Tartrate Other (See Comments)    Vivid dreams    Social History   Tobacco Use  . Smoking status: Former Smoker    Packs/day: 2.00    Years: 15.00    Pack years: 30.00    Types: Cigarettes    Last attempt to quit: 03/01/1987    Years since quitting: 30.0  . Smokeless tobacco: Never Used  Substance Use Topics  . Alcohol use: Yes    Comment: weekly    Family History  Problem Relation Age of Onset  . Heart disease Mother   . Cancer Father      Review of Systems  Gastrointestinal: Positive for diarrhea.  Musculoskeletal: Positive for back pain and joint pain.  All other systems reviewed and are negative.   Objective:  Physical Exam  Constitutional: He is oriented to person, place, and time. He appears well-developed  and well-nourished. No distress.  HENT:  Head: Normocephalic and atraumatic.  Nose: Nose normal.  Eyes: Conjunctivae and EOM are normal. Pupils are equal, round, and reactive to light.  Neck: Normal range of motion. Neck supple.  Cardiovascular: Normal rate, regular rhythm, normal heart sounds and intact distal pulses.  Respiratory: Effort normal and breath sounds normal. No respiratory distress. He has no wheezes.  GI: Soft. Bowel sounds are normal. He exhibits no distension. There is no tenderness.  Musculoskeletal:       Left hip: He exhibits decreased range of motion, tenderness and bony tenderness.  Lymphadenopathy:    He has no cervical  adenopathy.  Neurological: He is alert and oriented to person, place, and time. No cranial nerve deficit.  Skin: Skin is warm and dry. No rash noted. No erythema.  Psychiatric: He has a normal mood and affect. His behavior is normal.    Vital signs in last 24 hours: @VSRANGES @  Labs:   Estimated body mass index is 30.52 kg/m as calculated from the following:   Height as of 02/07/17: 6' (1.829 m).   Weight as of 02/07/17: 102.1 kg (225 lb).   Imaging Review Plain radiographs demonstrate severe degenerative joint disease of the left hip(s). The bone quality appears to be good for age and reported activity level.  Assessment/Plan:  End stage arthritis, left hip(s)  The patient history, physical examination, clinical judgement of the provider and imaging studies are consistent with end stage degenerative joint disease of the left hip(s) and total hip arthroplasty is deemed medically necessary. The treatment options including medical management, injection therapy, arthroscopy and arthroplasty were discussed at length. The risks and benefits of total hip arthroplasty were presented and reviewed. The risks due to aseptic loosening, infection, stiffness, dislocation/subluxation,  thromboembolic complications and other imponderables were discussed.   The patient acknowledged the explanation, agreed to proceed with the plan and consent was signed. Patient is being admitted for inpatient treatment for surgery, pain control, PT, OT, prophylactic antibiotics, VTE prophylaxis, progressive ambulation and ADL's and discharge planning.The patient is planning to be discharged home with home health services

## 2017-03-15 NOTE — H&P (View-Only) (Signed)
TOTAL HIP ADMISSION H&P  Patient is admitted for left total hip arthroplasty.  Subjective:  Chief Complaint: left hip pain  HPI: Richard Parrish, 67 y.o. male, has a history of pain and functional disability in the left hip(s) due to arthritis and patient has failed non-surgical conservative treatments for greater than 12 weeks to include NSAID's and/or analgesics, corticosteriod injections, use of assistive devices and activity modification.  Onset of symptoms was gradual starting >10 years ago with gradually worsening course since that time.The patient noted no past surgery on the left hip(s).  Patient currently rates pain in the left hip at 10 out of 10 with activity. Patient has night pain, worsening of pain with activity and weight bearing, pain that interfers with activities of daily living and pain with passive range of motion. Patient has evidence of periarticular osteophytes and joint space narrowing by imaging studies. This condition presents safety issues increasing the risk of falls.There is no current active infection.  Patient Active Problem List   Diagnosis Date Noted  . Right hip pain 06/01/2016  . Achilles tendon pain 12/31/2015  . Postinflammatory pulmonary fibrosis ([place]) 07/24/2013  . Cough 07/24/2013  . CAD (coronary artery disease), native coronary artery 04/01/2013  . Essential hypertension, benign 12/12/2012    Class: Chronic  . Other and unspecified hyperlipidemia 12/12/2012  . Atherosclerosis of native arteries of the extremities with intermittent claudication 11/25/2011  . Peripheral vascular disease, unspecified ([place]) 02/28/2011   Past Medical History:  Diagnosis Date  . Anxiety   . BPH (benign prostatic hypertrophy)   . C. difficile diarrhea   . CAD (coronary artery disease)   . Coronary artery disease 1989   cabg with dvg to rca   . Depression   . GERD (gastroesophageal reflux disease)   . Hyperlipidemia   . Hypertension   . Insomnia   . Leg pain   .  Osteoarthritis    hip  . Peripheral vascular disease ([place])   . Pulmonary fibrosis ([place])   . Sleep apnea    cpap setting of 60 per pt  . Spinal stenosis     Past Surgical History:  Procedure Laterality Date  . ABDOMINAL AORTAGRAM N/A 03/15/2011   Procedure: ABDOMINAL Maxcine Ham;  Surgeon: Serafina Mitchell, MD;  Location: [facility] CATH LAB;  Service: Cardiovascular;  Laterality: N/A;  . ABDOMINAL AORTAGRAM N/A 04/12/2011   Procedure: ABDOMINAL Maxcine Ham;  Surgeon: Serafina Mitchell, MD;  Location: [facility] CATH LAB;  Service: Cardiovascular;  Laterality: N/A;  . ABDOMINAL SURGERY  04/12/11   Abdominal Angiogram  . BACK SURGERY     lumbar fusion, S L 5 to L 4  . CORONARY ARTERY BYPASS GRAFT     x 1  . JOINT REPLACEMENT     right hip  . RCA AND RCA STENTS  04/12/11   Left   . rca stent    . rotator cuff surgery Right 2006    Current Outpatient Medications  Medication Sig Dispense Refill Last Dose  . acetaminophen (TYLENOL) 500 MG tablet Take 500 mg by mouth every 6 (six) hours as needed.   02/06/2017 at Unknown time  . aspirin 81 MG tablet Take 81 mg by mouth every morning.    02/06/2017 at Unknown time  . atorvastatin (LIPITOR) 40 MG tablet TAKE 1 TABLET (40 MG TOTAL) BY MOUTH DAILY. 90 tablet 2 02/06/2017 at Unknown time  . Calcium Carbonate-Vitamin D (CALCIUM + D PO) Take 1 tablet by mouth every morning.  02/06/2017 at Unknown time  . clopidogrel (PLAVIX) 75 MG tablet TAKE 1 TABLET BY MOUTH EVERY DAY 90 tablet 2 02/06/2017 at Unknown time  . glucosamine-chondroitin 500-400 MG tablet Take 1 tablet by mouth daily.   02/06/2017 at Unknown time  . HYDROcodone-acetaminophen (NORCO) 7.5-325 MG tablet Take 1 tablet by mouth every 6 (six) hours as needed.    02/07/2017 at Unknown time  . hyoscyamine (LEVSIN, ANASPAZ) 0.125 MG tablet Take 0.125 mg by mouth every 4 (four) hours as needed for cramping.    Past Week at Unknown time  . metoprolol succinate (TOPROL-XL) 50 MG 24 hr tablet Take 1 tablet (50 mg  total) by mouth daily. 90 tablet 2 02/06/2017 at 8a  . Multiple Vitamin (MULTIVITAMIN) tablet Take 1 tablet by mouth every morning.    02/06/2017 at Unknown time  . nitroGLYCERIN (NITRODUR - DOSED IN MG/24 HR) 0.2 mg/hr patch Use 1/4 patch daily to the affected area (Patient not taking: Reported on 11/11/2016) 30 patch 1 Not Taking at Unknown time  . nitroGLYCERIN (NITROSTAT) 0.4 MG SL tablet Place 0.4 mg under the tongue every 5 (five) minutes as needed for chest pain.   unk  . Omega-3 Fatty Acids (FISH OIL) 1000 MG CAPS Take 1,000 mg by mouth every morning.    02/06/2017 at Unknown time  . Omeprazole Magnesium (PRILOSEC OTC PO) Take 1 tablet by mouth daily.   02/06/2017 at Unknown time  . PARoxetine (PAXIL) 40 MG tablet Take 40 mg by mouth daily.  6 02/06/2017 at Unknown time  . tamsulosin (FLOMAX) 0.4 MG CAPS capsule Take 1 capsule by mouth every morning.  5 02/06/2017 at Unknown time  . valsartan (DIOVAN) 320 MG tablet TAKE 1 TABLET BY MOUTH EVERY DAY 30 tablet 9 02/06/2017 at Unknown time   No current facility-administered medications for this visit.    Allergies  Allergen Reactions  . Ambien [Zolpidem Tartrate] Other (See Comments)    Bad dreams  . Zolpidem Tartrate Other (See Comments)    Vivid dreams    Social History   Tobacco Use  . Smoking status: Former Smoker    Packs/day: 2.00    Years: 15.00    Pack years: 30.00    Types: Cigarettes    Last attempt to quit: 03/01/1987    Years since quitting: 30.0  . Smokeless tobacco: Never Used  Substance Use Topics  . Alcohol use: Yes    Comment: weekly    Family History  Problem Relation Age of Onset  . Heart disease Mother   . Cancer Father      Review of Systems  Gastrointestinal: Positive for diarrhea.  Musculoskeletal: Positive for back pain and joint pain.  All other systems reviewed and are negative.   Objective:  Physical Exam  Constitutional: He is oriented to person, place, and time. He appears well-developed  and well-nourished. No distress.  HENT:  Head: Normocephalic and atraumatic.  Nose: Nose normal.  Eyes: Conjunctivae and EOM are normal. Pupils are equal, round, and reactive to light.  Neck: Normal range of motion. Neck supple.  Cardiovascular: Normal rate, regular rhythm, normal heart sounds and intact distal pulses.  Respiratory: Effort normal and breath sounds normal. No respiratory distress. He has no wheezes.  GI: Soft. Bowel sounds are normal. He exhibits no distension. There is no tenderness.  Musculoskeletal:       Left hip: He exhibits decreased range of motion, tenderness and bony tenderness.  Lymphadenopathy:    He has no cervical  adenopathy.  Neurological: He is alert and oriented to person, place, and time. No cranial nerve deficit.  Skin: Skin is warm and dry. No rash noted. No erythema.  Psychiatric: He has a normal mood and affect. His behavior is normal.    Vital signs in last 24 hours: @VSRANGES @  Labs:   Estimated body mass index is 30.52 kg/m as calculated from the following:   Height as of 02/07/17: 6' (1.829 m).   Weight as of 02/07/17: 102.1 kg (225 lb).   Imaging Review Plain radiographs demonstrate severe degenerative joint disease of the left hip(s). The bone quality appears to be good for age and reported activity level.  Assessment/Plan:  End stage arthritis, left hip(s)  The patient history, physical examination, clinical judgement of the provider and imaging studies are consistent with end stage degenerative joint disease of the left hip(s) and total hip arthroplasty is deemed medically necessary. The treatment options including medical management, injection therapy, arthroscopy and arthroplasty were discussed at length. The risks and benefits of total hip arthroplasty were presented and reviewed. The risks due to aseptic loosening, infection, stiffness, dislocation/subluxation,  thromboembolic complications and other imponderables were discussed.   The patient acknowledged the explanation, agreed to proceed with the plan and consent was signed. Patient is being admitted for inpatient treatment for surgery, pain control, PT, OT, prophylactic antibiotics, VTE prophylaxis, progressive ambulation and ADL's and discharge planning.The patient is planning to be discharged home with home health services

## 2017-03-23 NOTE — Pre-Procedure Instructions (Signed)
Richard Parrish  03/23/2017      [id number] Richard Parrish, [place] [address] Phone: [phone] Fax: [phone]  [facility] Richard Parrish, [place] Richard Parrish [address] [place] [address] Phone: [phone] Fax: [phone]    Your procedure is scheduled on Friday, 04/07/17.  Report to [facility] Admitting at [place].M.  Call this number if you have problems the morning of surgery:  [phone]   Remember:  Do not eat food or drink liquids after midnight.  Take these medicines the morning of surgery with A SIP OF WATER: Acetaminophen (Tylenol) if needed, Hydrocodone (Norco) if needed, Metoprolol (Toprol), Omeprazole (Prilosec), Paroxetine (Paxil), Tamsulosin (Flomax)  7 days prior to surgery STOP taking any Aspirin(unless otherwise instructed by your surgeon), Aleve, Naproxen, Ibuprofen, Motrin, Advil, Goody's, BC's, all herbal medications, fish oil, and all vitamins    Do not wear jewelry, make-up or nail polish.  Do not wear lotions, powders, or perfumes, or deodorant.  Do not shave 48 hours prior to surgery.  Men may shave face and neck.  Do not bring valuables to the hospital.  [facility] is not responsible for any belongings or valuables.  Contacts, dentures or bridgework may not be worn into surgery.  Leave your suitcase in the car.  After surgery it may be brought to your room.  For patients admitted to the hospital, discharge time will be determined by your treatment team.  Patients discharged the day of surgery will not be allowed to drive home.   Special instructions:  [facility]- Preparing for Surgery Before surgery, you can play an important role.  Because skin is not sterile, your skin needs to be as free of germs as possible.  You can reduce the number of germs on your skin by washing with CHG (chlorahexidine gluconate) Soap  before surgery.  CHG is an antiseptic cleaner which kills germs and bonds with the skin to continue killing germs even after washing. Please do not use if you have an allergy to CHG or antibacterial soaps.  If your skin becomes reddened/irritated stop using the CHGDo not shave (including legs and underarms) for at least 48 hours prior to first CHG shower.  It is OK to shave your face. Please follow these instructions carefully. 1. Shower the night before surgery and the morning of surgery with CHG. 2. If you chose to wash your hair, wash your hair first as usual with your normal shampoo. 3. After you shampoo, rinse your hair and body thoroughly to remove the shampoo. 4. Use CHG as you would any other liquid soap.  You can apply CHG directly to the skin and wash gently with a scrungie or clean washcloth. 5. Apply the CHG Soap to your body ONLY FROM THE NECK DOWN.  Do not use on open wounds or open sores.  Avoid contact with your eyes, ears, mouth, and genitals (private parts).  Wash genitals (private parts) with your normal soap. 6. Wash thoroughly, paying special attention to the area where your surgery will be performed. 7. Thoroughly rise your body with warm water from the neck down. 8. DO NOT shower/wash with your normal soap after using and rinsing off the CHG Soap. 9. Pat yourself dry with a clean towel. 10. Wear clean pajamas. 11. Place clean sheets on your bed the night of your first shower and do  not sleep with pets. Day of Surgery: Do not apply any deodorants/lotions.  Please wear clean clothes to the hospital/surgery [facility].  Please read over the following fact sheets that you were given. Pain Booklet, Coughing and Deep Breathing, MRSA Information and Surgical Site Infection Prevention

## 2017-03-24 ENCOUNTER — Other Ambulatory Visit: Payer: Self-pay

## 2017-03-24 ENCOUNTER — Ambulatory Visit (HOSPITAL_COMMUNITY)
Admission: RE | Admit: 2017-03-24 | Discharge: 2017-03-24 | Disposition: A | Discharge status: Home / Self Care | Payer: Medicare Other | Source: Ambulatory Visit | Attending: Physician Assistant | Admitting: Physician Assistant

## 2017-03-24 ENCOUNTER — Encounter (HOSPITAL_COMMUNITY): Payer: Self-pay

## 2017-03-24 ENCOUNTER — Encounter (HOSPITAL_COMMUNITY)
Admission: RE | Admit: 2017-03-24 | Discharge: 2017-03-24 | Disposition: A | Discharge status: Home / Self Care | Payer: Medicare Other | Source: Ambulatory Visit | Attending: Orthopedic Surgery | Admitting: Orthopedic Surgery

## 2017-03-24 DIAGNOSIS — Z01818 Encounter for other preprocedural examination: Secondary | ICD-10-CM | POA: Diagnosis not present

## 2017-03-24 DIAGNOSIS — M1612 Unilateral primary osteoarthritis, left hip: Secondary | ICD-10-CM | POA: Diagnosis not present

## 2017-03-24 DIAGNOSIS — Z01812 Encounter for preprocedural laboratory examination: Secondary | ICD-10-CM | POA: Diagnosis not present

## 2017-03-24 DIAGNOSIS — Z471 Aftercare following joint replacement surgery: Secondary | ICD-10-CM | POA: Diagnosis not present

## 2017-03-24 DIAGNOSIS — Z96642 Presence of left artificial hip joint: Secondary | ICD-10-CM | POA: Diagnosis not present

## 2017-03-24 HISTORY — DX: Acute myocardial infarction, unspecified: I21.9

## 2017-03-24 LAB — URINALYSIS, ROUTINE W REFLEX MICROSCOPIC
BILIRUBIN URINE: NEGATIVE
Glucose, UA: NEGATIVE mg/dL
HGB URINE DIPSTICK: NEGATIVE
Ketones, ur: NEGATIVE mg/dL
Leukocytes, UA: NEGATIVE
Nitrite: NEGATIVE
Protein, ur: NEGATIVE mg/dL
SPECIFIC GRAVITY, URINE: 1.025 (ref 1.005–1.030)
pH: 5 (ref 5.0–8.0)

## 2017-03-24 LAB — CBC WITH DIFFERENTIAL/PLATELET
BASOS PCT: 1 %
Basophils Absolute: 0.1 10*3/uL (ref 0.0–0.1)
EOS ABS: 0.3 10*3/uL (ref 0.0–0.7)
Eosinophils Relative: 4 %
HCT: 41.4 % (ref 39.0–52.0)
Hemoglobin: 14.5 g/dL (ref 13.0–17.0)
Lymphocytes Relative: 14 %
Lymphs Abs: 1.1 10*3/uL (ref 0.7–4.0)
MCH: 32.7 pg (ref 26.0–34.0)
MCHC: 35 g/dL (ref 30.0–36.0)
MCV: 93.5 fL (ref 78.0–100.0)
MONO ABS: 0.6 10*3/uL (ref 0.1–1.0)
MONOS PCT: 7 %
Neutro Abs: 6 10*3/uL (ref 1.7–7.7)
Neutrophils Relative %: 74 %
PLATELETS: 336 10*3/uL (ref 150–400)
RBC: 4.43 MIL/uL (ref 4.22–5.81)
RDW: 12.9 % (ref 11.5–15.5)
WBC: 8 10*3/uL (ref 4.0–10.5)

## 2017-03-24 LAB — COMPREHENSIVE METABOLIC PANEL
ALBUMIN: 4.1 g/dL (ref 3.5–5.0)
ALT: 21 U/L (ref 17–63)
ANION GAP: 9 (ref 5–15)
AST: 25 U/L (ref 15–41)
Alkaline Phosphatase: 78 U/L (ref 38–126)
BUN: 18 mg/dL (ref 6–20)
CO2: 23 mmol/L (ref 22–32)
Calcium: 9.4 mg/dL (ref 8.9–10.3)
Chloride: 108 mmol/L (ref 101–111)
Creatinine, Ser: 0.86 mg/dL (ref 0.61–1.24)
GFR calc Af Amer: >60 mL/min (ref >60)
GFR calc non Af Amer: >60 mL/min (ref >60)
GLUCOSE: 105 mg/dL — AB (ref 65–99)
POTASSIUM: 4.5 mmol/L (ref 3.5–5.1)
SODIUM: 140 mmol/L (ref 135–145)
TOTAL PROTEIN: 6.5 g/dL (ref 6.5–8.1)
Total Bilirubin: 0.8 mg/dL (ref 0.3–1.2)

## 2017-03-24 LAB — APTT: aPTT: 35 seconds (ref 24–36)

## 2017-03-24 LAB — ABO/RH: ABO/RH(D): A POS

## 2017-03-24 LAB — PROTIME-INR
INR: 1
Prothrombin Time: 13.1 seconds (ref 11.4–15.2)

## 2017-03-24 LAB — TYPE AND SCREEN
ABO/RH(D): A POS
ANTIBODY SCREEN: NEGATIVE

## 2017-03-24 LAB — SURGICAL PCR SCREEN
MRSA, PCR: NEGATIVE
Staphylococcus aureus: NEGATIVE

## 2017-03-24 NOTE — Pre-Procedure Instructions (Signed)
Richard Parrish  03/24/2017      [id number] [name], [place] [address] Phone: [phone] Fax: [phone]  [facility] [place], [place] [address] [place] [address] Phone: [phone] Fax: [phone]    Your procedure is scheduled on Friday, 04/07/17.  Report to [facility] Admitting at [place].M.  Call this number if you have problems the morning of surgery:  [phone]   Remember:  Do not eat food or drink liquids after midnight.  Take these medicines the morning of surgery with A SIP OF WATER: Acetaminophen (Tylenol) if needed, Hydrocodone (Norco) if needed, Metoprolol (Toprol), Omeprazole (Prilosec), Paroxetine (Paxil), Tamsulosin (Flomax)  7 days prior to surgery STOP taking any, Aleve, Naproxen, Ibuprofen, Motrin, Advil, Goody's, BC's, all herbal medications, fish oil, and all vitamins    Do not wear jewelry, make-up or nail polish.  Do not wear lotions, powders, or perfumes, or deodorant.  Do not shave 48 hours prior to surgery.  Men may shave face and neck.  Do not bring valuables to the hospital.  [facility] is not responsible for any belongings or valuables.  Contacts, dentures or bridgework may not be worn into surgery.  Leave your suitcase in the car.  After surgery it may be brought to your room.  For patients admitted to the hospital, discharge time will be determined by your treatment team.  Patients discharged the day of surgery will not be allowed to drive home.   Special instructions:  [place]- Preparing for Surgery Before surgery, you can play an important role.  Because skin is not sterile, your skin needs to be as free of germs as possible.  You can reduce the number of germs on your skin by washing with CHG (chlorahexidine gluconate) Soap before surgery.  CHG is an antiseptic cleaner which  kills germs and bonds with the skin to continue killing germs even after washing. Please do not use if you have an allergy to CHG or antibacterial soaps.  If your skin becomes reddened/irritated stop using the CHGDo not shave (including legs and underarms) for at least 48 hours prior to first CHG shower.  It is OK to shave your face. Please follow these instructions carefully. 1. Shower the night before surgery and the morning of surgery with CHG. 2. If you chose to wash your hair, wash your hair first as usual with your normal shampoo. 3. After you shampoo, rinse your hair and body thoroughly to remove the shampoo. 4. Use CHG as you would any other liquid soap.  You can apply CHG directly to the skin and wash gently with a scrungie or clean washcloth. 5. Apply the CHG Soap to your body ONLY FROM THE NECK DOWN.  Do not use on open wounds or open sores.  Avoid contact with your eyes, ears, mouth, and genitals (private parts).  Wash genitals (private parts) with your normal soap. 6. Wash thoroughly, paying special attention to the area where your surgery will be performed. 7. Thoroughly rise your body with warm water from the neck down. 8. DO NOT shower/wash with your normal soap after using and rinsing off the CHG Soap. 9. Pat yourself dry with a clean towel. 10. Wear clean pajamas. 11. Place clean sheets on your bed the night of your first shower and do not sleep with pets. Day of  Surgery: Do not apply any deodorants/lotions.  Please wear clean clothes to the hospital/surgery center.  Please read over the following fact sheets that you were given. Pain Booklet, Coughing and Deep Breathing, MRSA Information and Surgical Site Infection Prevention

## 2017-03-26 LAB — URINE CULTURE: Culture: NO GROWTH

## 2017-03-27 NOTE — Progress Notes (Addendum)
Anesthesia Chart Review:  Pt is a 67 year old male scheduled for L total hip arthroplasty on 04/07/2017 with Earlie Server, MD  - PCP is Seward Carol, MD - Cardiologist is Daneen Schick, MD. Last office visit 07/21/16.  Cleared for surgery by Almyra Deforest, PA 02/15/17 - Used to see Christinia Gully, MD with pulmonology. Last office visit 09/06/13  PMH includes:  CAD (s/p CABG 1989), HTN, PVD (s/p s/p L SFA and popliteal atherectomy and stent 04/13/11), hyperlipidemia, OSA, pulmonary fibrosis, GERD. Former smoker (quit 1989). BMI 31  Medications include: ASA 81mg , lipitor, plavix, metoprolol, prilosec, valsartan. Last dose plavix 04/01/17. Will continue ASA perioperatively  BP (!) 141/72   Pulse (!) 59   Temp 36.5 C   Resp 20   Ht 6' (1.829 m)   Wt 228 lb 12.8 oz (103.8 kg)   SpO2 95%   BMI 31.03 kg/m   Preoperative labs reviewed.    CXR 03/24/17: No acute cardiopulmonary process.  EKG 07/21/16: NSR  CT abd/pelvis 02/07/17:  1. No acute findings in the abdomen pelvis. 2. Chronic interstitial thickening at the lung bases. 3.  Aortic Atherosclerosis   CT chest 07/18/13:  - Widespread interstitial disease, primarily in the lower lobe regions. The calcification along the posterior left pleura suggests underlying asbestos exposure. 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 There is underlying emphysema. There is lower lobe bronchiectatic change bilaterally, most notably in the left lower lobe.  Cardiac cath 09/07/10:  1. Widely patent left coronary system with only mild-to-moderate     narrowing in the first diagonal.  The left coronary system is     relatively small. 2. Dominant native right coronary system with total occlusion of     proximal vessel. 3. Patent but diffusely diseased saphenous vein graft to the distal     right coronary with 50-75% intermedius stenosis in the mid and     proximal segment proximal to previously placed stent.  If no  changes, I anticipate pt can proceed with surgery as scheduled.   Willeen Cass, FNP-BC [facility] Short Stay Surgical Center/Anesthesiology Phone: [phone] 03/27/2017 2:54 PM

## 2017-03-30 ENCOUNTER — Other Ambulatory Visit: Payer: Self-pay | Admitting: Orthopedic Surgery

## 2017-04-03 DIAGNOSIS — R195 Other fecal abnormalities: Secondary | ICD-10-CM | POA: Diagnosis not present

## 2017-04-03 DIAGNOSIS — R197 Diarrhea, unspecified: Secondary | ICD-10-CM | POA: Diagnosis not present

## 2017-04-03 DIAGNOSIS — Z800 Family history of malignant neoplasm of digestive organs: Secondary | ICD-10-CM | POA: Diagnosis not present

## 2017-04-03 DIAGNOSIS — G894 Chronic pain syndrome: Secondary | ICD-10-CM | POA: Diagnosis not present

## 2017-04-03 DIAGNOSIS — Z791 Long term (current) use of non-steroidal anti-inflammatories (NSAID): Secondary | ICD-10-CM | POA: Diagnosis not present

## 2017-04-03 DIAGNOSIS — R6881 Early satiety: Secondary | ICD-10-CM | POA: Diagnosis not present

## 2017-04-03 DIAGNOSIS — Z8679 Personal history of other diseases of the circulatory system: Secondary | ICD-10-CM | POA: Diagnosis not present

## 2017-04-03 DIAGNOSIS — M791 Myalgia, unspecified site: Secondary | ICD-10-CM | POA: Diagnosis not present

## 2017-04-03 DIAGNOSIS — M5136 Other intervertebral disc degeneration, lumbar region: Secondary | ICD-10-CM | POA: Diagnosis not present

## 2017-04-06 MED ORDER — TRANEXAMIC ACID 1000 MG/10ML IV SOLN
1000.0000 mg | INTRAVENOUS | Status: AC
  Administered 2017-04-07: 1000 mg via INTRAVENOUS
  Filled 2017-04-06: qty 1100

## 2017-04-06 MED ORDER — VANCOMYCIN HCL 10 G IV SOLR
1500.0000 mg | INTRAVENOUS | Status: AC
  Administered 2017-04-07: 1500 mg via INTRAVENOUS
  Filled 2017-04-06: qty 1500

## 2017-04-06 MED ORDER — BUPIVACAINE LIPOSOME 1.3 % IJ SUSP
20.0000 mL | INTRAMUSCULAR | Status: AC
  Filled 2017-04-06 (×2): qty 20

## 2017-04-07 ENCOUNTER — Encounter (HOSPITAL_COMMUNITY)
Admission: RE | Disposition: A | Discharge status: Home Health | Payer: Self-pay | Source: Ambulatory Visit | Attending: Orthopedic Surgery

## 2017-04-07 ENCOUNTER — Other Ambulatory Visit: Payer: Self-pay

## 2017-04-07 ENCOUNTER — Inpatient Hospital Stay (HOSPITAL_COMMUNITY): Payer: Medicare Other | Admitting: Anesthesiology

## 2017-04-07 ENCOUNTER — Inpatient Hospital Stay (HOSPITAL_COMMUNITY): Payer: Medicare Other | Admitting: Emergency Medicine

## 2017-04-07 ENCOUNTER — Inpatient Hospital Stay (HOSPITAL_COMMUNITY)
Admission: RE | Admit: 2017-04-07 | Discharge: 2017-04-10 | DRG: 470 | Disposition: A | Discharge status: Home Health | Payer: Medicare Other | Source: Ambulatory Visit | Attending: Orthopedic Surgery | Admitting: Orthopedic Surgery

## 2017-04-07 ENCOUNTER — Inpatient Hospital Stay (HOSPITAL_COMMUNITY): Payer: Medicare Other

## 2017-04-07 ENCOUNTER — Encounter (HOSPITAL_COMMUNITY): Payer: Self-pay | Admitting: Anesthesiology

## 2017-04-07 DIAGNOSIS — Z888 Allergy status to other drugs, medicaments and biological substances status: Secondary | ICD-10-CM | POA: Diagnosis not present

## 2017-04-07 DIAGNOSIS — Z7902 Long term (current) use of antithrombotics/antiplatelets: Secondary | ICD-10-CM | POA: Diagnosis not present

## 2017-04-07 DIAGNOSIS — I252 Old myocardial infarction: Secondary | ICD-10-CM | POA: Diagnosis not present

## 2017-04-07 DIAGNOSIS — I739 Peripheral vascular disease, unspecified: Secondary | ICD-10-CM | POA: Diagnosis present

## 2017-04-07 DIAGNOSIS — G4733 Obstructive sleep apnea (adult) (pediatric): Secondary | ICD-10-CM | POA: Diagnosis present

## 2017-04-07 DIAGNOSIS — Z471 Aftercare following joint replacement surgery: Secondary | ICD-10-CM | POA: Diagnosis not present

## 2017-04-07 DIAGNOSIS — Z951 Presence of aortocoronary bypass graft: Secondary | ICD-10-CM | POA: Diagnosis not present

## 2017-04-07 DIAGNOSIS — Z87891 Personal history of nicotine dependence: Secondary | ICD-10-CM | POA: Diagnosis not present

## 2017-04-07 DIAGNOSIS — E785 Hyperlipidemia, unspecified: Secondary | ICD-10-CM | POA: Diagnosis not present

## 2017-04-07 DIAGNOSIS — Z96641 Presence of right artificial hip joint: Secondary | ICD-10-CM | POA: Diagnosis present

## 2017-04-07 DIAGNOSIS — K219 Gastro-esophageal reflux disease without esophagitis: Secondary | ICD-10-CM | POA: Diagnosis present

## 2017-04-07 DIAGNOSIS — Z79899 Other long term (current) drug therapy: Secondary | ICD-10-CM | POA: Diagnosis not present

## 2017-04-07 DIAGNOSIS — M1612 Unilateral primary osteoarthritis, left hip: Secondary | ICD-10-CM | POA: Diagnosis not present

## 2017-04-07 DIAGNOSIS — Z7982 Long term (current) use of aspirin: Secondary | ICD-10-CM | POA: Diagnosis not present

## 2017-04-07 DIAGNOSIS — Z955 Presence of coronary angioplasty implant and graft: Secondary | ICD-10-CM

## 2017-04-07 DIAGNOSIS — I2510 Atherosclerotic heart disease of native coronary artery without angina pectoris: Secondary | ICD-10-CM | POA: Diagnosis not present

## 2017-04-07 DIAGNOSIS — N400 Enlarged prostate without lower urinary tract symptoms: Secondary | ICD-10-CM | POA: Diagnosis present

## 2017-04-07 DIAGNOSIS — I10 Essential (primary) hypertension: Secondary | ICD-10-CM | POA: Diagnosis present

## 2017-04-07 DIAGNOSIS — Z96642 Presence of left artificial hip joint: Secondary | ICD-10-CM | POA: Diagnosis not present

## 2017-04-07 HISTORY — DX: Unspecified osteoarthritis, unspecified site: M19.90

## 2017-04-07 HISTORY — DX: Low back pain: M54.5

## 2017-04-07 HISTORY — DX: Low back pain, unspecified: M54.50

## 2017-04-07 HISTORY — DX: Obstructive sleep apnea (adult) (pediatric): G47.33

## 2017-04-07 HISTORY — PX: TOTAL HIP ARTHROPLASTY: SHX124

## 2017-04-07 HISTORY — DX: Peripheral vascular disease, unspecified: I73.9

## 2017-04-07 HISTORY — DX: Dependence on other enabling machines and devices: Z99.89

## 2017-04-07 HISTORY — DX: Other chronic pain: G89.29

## 2017-04-07 HISTORY — DX: Pneumonia, unspecified organism: J18.9

## 2017-04-07 SURGERY — ARTHROPLASTY, HIP, TOTAL,POSTERIOR APPROACH
Anesthesia: Monitor Anesthesia Care | Site: Hip | Laterality: Left

## 2017-04-07 MED ORDER — METOCLOPRAMIDE HCL 5 MG PO TABS: 5.0000 mg | ORAL_TABLET | Freq: Three times a day (TID) | ORAL | Status: DC | PRN

## 2017-04-07 MED ORDER — HYDROMORPHONE HCL 1 MG/ML IJ SOLN
INTRAMUSCULAR | Status: AC
  Filled 2017-04-07: qty 1

## 2017-04-07 MED ORDER — OXYCODONE HCL 5 MG/5ML PO SOLN: 5.0000 mg | Freq: Once | ORAL | Status: DC | PRN

## 2017-04-07 MED ORDER — BUPIVACAINE-EPINEPHRINE 0.5% -1:200000 IJ SOLN
INTRAMUSCULAR | Status: DC | PRN
  Administered 2017-04-07: 30 mL

## 2017-04-07 MED ORDER — CHLORHEXIDINE GLUCONATE 4 % EX LIQD: 60.0000 mL | Freq: Once | CUTANEOUS | Status: DC

## 2017-04-07 MED ORDER — MIDAZOLAM HCL 2 MG/2ML IJ SOLN
INTRAMUSCULAR | Status: AC
  Filled 2017-04-07: qty 2

## 2017-04-07 MED ORDER — OMEPRAZOLE 20 MG PO CPDR
20.0000 mg | DELAYED_RELEASE_CAPSULE | Freq: Every day | ORAL | Status: DC
  Administered 2017-04-08 – 2017-04-10 (×3): 20 mg via ORAL
  Filled 2017-04-07 (×3): qty 1

## 2017-04-07 MED ORDER — METOPROLOL SUCCINATE ER 50 MG PO TB24
50.0000 mg | ORAL_TABLET | Freq: Once | ORAL | Status: AC
  Administered 2017-04-07: 50 mg via ORAL
  Filled 2017-04-07: qty 1

## 2017-04-07 MED ORDER — CLOPIDOGREL BISULFATE 75 MG PO TABS
75.0000 mg | ORAL_TABLET | Freq: Every day | ORAL | Status: DC
  Administered 2017-04-08 – 2017-04-10 (×3): 75 mg via ORAL
  Filled 2017-04-07 (×4): qty 1

## 2017-04-07 MED ORDER — DOCUSATE SODIUM 100 MG PO CAPS
100.0000 mg | ORAL_CAPSULE | Freq: Two times a day (BID) | ORAL | Status: DC
  Administered 2017-04-07 – 2017-04-10 (×7): 100 mg via ORAL
  Filled 2017-04-07 (×7): qty 1

## 2017-04-07 MED ORDER — NITROGLYCERIN 0.4 MG SL SUBL: 0.4000 mg | SUBLINGUAL_TABLET | SUBLINGUAL | Status: DC | PRN

## 2017-04-07 MED ORDER — ONDANSETRON HCL 4 MG PO TABS: 4.0000 mg | ORAL_TABLET | Freq: Four times a day (QID) | ORAL | Status: DC | PRN

## 2017-04-07 MED ORDER — ONDANSETRON HCL 4 MG/2ML IJ SOLN
INTRAMUSCULAR | Status: AC
  Filled 2017-04-07: qty 2

## 2017-04-07 MED ORDER — EPHEDRINE 5 MG/ML INJ
INTRAVENOUS | Status: AC
  Filled 2017-04-07: qty 10

## 2017-04-07 MED ORDER — ASPIRIN EC 325 MG PO TBEC
325.0000 mg | DELAYED_RELEASE_TABLET | Freq: Every day | ORAL | Status: DC
  Administered 2017-04-08 – 2017-04-10 (×3): 325 mg via ORAL
  Filled 2017-04-07 (×4): qty 1

## 2017-04-07 MED ORDER — ONDANSETRON HCL 4 MG/2ML IJ SOLN: 4.0000 mg | Freq: Four times a day (QID) | INTRAMUSCULAR | Status: DC | PRN

## 2017-04-07 MED ORDER — OXYCODONE HCL 5 MG PO TABS: ORAL_TABLET | ORAL | 0 refills | Status: DC

## 2017-04-07 MED ORDER — ACETAMINOPHEN 500 MG PO TABS
1000.0000 mg | ORAL_TABLET | Freq: Four times a day (QID) | ORAL | Status: AC
  Administered 2017-04-07 – 2017-04-08 (×4): 1000 mg via ORAL
  Filled 2017-04-07 (×4): qty 2

## 2017-04-07 MED ORDER — ACETAMINOPHEN 325 MG PO TABS
650.0000 mg | ORAL_TABLET | ORAL | Status: DC | PRN
  Administered 2017-04-08 – 2017-04-10 (×3): 650 mg via ORAL
  Filled 2017-04-07 (×3): qty 2

## 2017-04-07 MED ORDER — ASPIRIN EC 325 MG PO TBEC: 325.0000 mg | DELAYED_RELEASE_TABLET | Freq: Every day | ORAL | 0 refills | Status: DC

## 2017-04-07 MED ORDER — RISAQUAD PO CAPS
ORAL_CAPSULE | Freq: Every day | ORAL | Status: DC
  Administered 2017-04-07 – 2017-04-10 (×4): 1 via ORAL
  Filled 2017-04-07 (×4): qty 1

## 2017-04-07 MED ORDER — PHENYLEPHRINE HCL 10 MG/ML IJ SOLN
INTRAMUSCULAR | Status: DC | PRN
  Administered 2017-04-07: 40 ug via INTRAVENOUS
  Administered 2017-04-07 (×3): 80 ug via INTRAVENOUS
  Administered 2017-04-07: 40 ug via INTRAVENOUS
  Administered 2017-04-07: 80 ug via INTRAVENOUS

## 2017-04-07 MED ORDER — SODIUM CHLORIDE 0.9 % IV SOLN
INTRAVENOUS | Status: DC
  Administered 2017-04-07: 15:00:00 via INTRAVENOUS

## 2017-04-07 MED ORDER — OXYCODONE HCL 5 MG PO TABS
ORAL_TABLET | ORAL | Status: AC
  Filled 2017-04-07: qty 2

## 2017-04-07 MED ORDER — HYDROMORPHONE HCL 1 MG/ML IJ SOLN
INTRAMUSCULAR | Status: AC
  Administered 2017-04-07: 0.5 mg via INTRAVENOUS
  Filled 2017-04-07: qty 1

## 2017-04-07 MED ORDER — SORBITOL 70 % SOLN: 30.0000 mL | Freq: Every day | Status: DC | PRN

## 2017-04-07 MED ORDER — PAROXETINE HCL 20 MG PO TABS
40.0000 mg | ORAL_TABLET | Freq: Every day | ORAL | Status: DC
  Administered 2017-04-07 – 2017-04-10 (×4): 40 mg via ORAL
  Filled 2017-04-07 (×4): qty 2

## 2017-04-07 MED ORDER — PROPOFOL 500 MG/50ML IV EMUL
INTRAVENOUS | Status: DC | PRN
  Administered 2017-04-07: 50 ug/kg/min via INTRAVENOUS

## 2017-04-07 MED ORDER — ALBUMIN HUMAN 5 % IV SOLN
INTRAVENOUS | Status: DC | PRN
  Administered 2017-04-07 (×2): via INTRAVENOUS

## 2017-04-07 MED ORDER — PROPOFOL 10 MG/ML IV BOLUS
INTRAVENOUS | Status: AC
  Filled 2017-04-07: qty 40

## 2017-04-07 MED ORDER — ADULT MULTIVITAMIN W/MINERALS CH
ORAL_TABLET | ORAL | Status: DC
  Administered 2017-04-07 – 2017-04-09 (×2): 1 via ORAL
  Filled 2017-04-07 (×2): qty 1

## 2017-04-07 MED ORDER — OXYCODONE HCL 5 MG PO TABS: 5.0000 mg | ORAL_TABLET | Freq: Once | ORAL | Status: DC | PRN

## 2017-04-07 MED ORDER — HYOSCYAMINE SULFATE 0.125 MG PO TBDP
0.1250 mg | ORAL_TABLET | ORAL | Status: DC | PRN
  Administered 2017-04-07: 0.125 mg via SUBLINGUAL

## 2017-04-07 MED ORDER — TAMSULOSIN HCL 0.4 MG PO CAPS
0.4000 mg | ORAL_CAPSULE | Freq: Every morning | ORAL | Status: DC
  Administered 2017-04-07 – 2017-04-10 (×4): 0.4 mg via ORAL
  Filled 2017-04-07 (×4): qty 1

## 2017-04-07 MED ORDER — HYDROMORPHONE HCL 1 MG/ML IJ SOLN
1.0000 mg | INTRAMUSCULAR | Status: DC | PRN
  Administered 2017-04-07 – 2017-04-08 (×2): 1 mg via INTRAVENOUS
  Filled 2017-04-07 (×2): qty 1

## 2017-04-07 MED ORDER — TRANEXAMIC ACID 1000 MG/10ML IV SOLN
1000.0000 mg | Freq: Once | INTRAVENOUS | Status: AC
  Administered 2017-04-07: 1000 mg via INTRAVENOUS
  Filled 2017-04-07: qty 10

## 2017-04-07 MED ORDER — ACETAMINOPHEN 650 MG RE SUPP: 650.0000 mg | RECTAL | Status: DC | PRN

## 2017-04-07 MED ORDER — PHENYLEPHRINE HCL 10 MG/ML IJ SOLN: INTRAMUSCULAR | Status: DC | PRN

## 2017-04-07 MED ORDER — BUPIVACAINE IN DEXTROSE 0.75-8.25 % IT SOLN
INTRATHECAL | Status: DC | PRN
  Administered 2017-04-07: 2 mL via INTRATHECAL

## 2017-04-07 MED ORDER — FENTANYL CITRATE (PF) 100 MCG/2ML IJ SOLN
INTRAMUSCULAR | Status: DC | PRN
  Administered 2017-04-07 (×2): 50 ug via INTRAVENOUS

## 2017-04-07 MED ORDER — HYOSCYAMINE SULFATE 0.125 MG PO TABS
0.1250 mg | ORAL_TABLET | ORAL | Status: DC | PRN
  Filled 2017-04-07: qty 1

## 2017-04-07 MED ORDER — PHENYLEPHRINE 40 MCG/ML (10ML) SYRINGE FOR IV PUSH (FOR BLOOD PRESSURE SUPPORT)
PREFILLED_SYRINGE | INTRAVENOUS | Status: AC
  Filled 2017-04-07: qty 10

## 2017-04-07 MED ORDER — FLEET ENEMA 7-19 GM/118ML RE ENEM: 1.0000 | ENEMA | Freq: Once | RECTAL | Status: DC | PRN

## 2017-04-07 MED ORDER — PHENYLEPHRINE HCL 10 MG/ML IJ SOLN
INTRAVENOUS | Status: DC | PRN
  Administered 2017-04-07: 50 ug/min via INTRAVENOUS

## 2017-04-07 MED ORDER — EPHEDRINE SULFATE 50 MG/ML IJ SOLN
INTRAMUSCULAR | Status: DC | PRN
  Administered 2017-04-07: 5 mg via INTRAVENOUS
  Administered 2017-04-07: 15 mg via INTRAVENOUS
  Administered 2017-04-07 (×2): 10 mg via INTRAVENOUS
  Administered 2017-04-07: 15 mg via INTRAVENOUS
  Administered 2017-04-07 (×2): 5 mg via INTRAVENOUS
  Administered 2017-04-07: 10 mg via INTRAVENOUS
  Administered 2017-04-07: 15 mg via INTRAVENOUS
  Administered 2017-04-07: 10 mg via INTRAVENOUS

## 2017-04-07 MED ORDER — PHENOL 1.4 % MT LIQD: 1.0000 | OROMUCOSAL | Status: DC | PRN

## 2017-04-07 MED ORDER — SODIUM CHLORIDE 0.9 % IR SOLN
Status: DC | PRN
Start: 2017-04-07 — End: 2017-04-07
  Administered 2017-04-07: 1000 mL

## 2017-04-07 MED ORDER — METOPROLOL SUCCINATE ER 50 MG PO TB24
50.0000 mg | ORAL_TABLET | Freq: Every day | ORAL | Status: DC
  Administered 2017-04-08 – 2017-04-10 (×3): 50 mg via ORAL
  Filled 2017-04-07 (×4): qty 1

## 2017-04-07 MED ORDER — HYDROMORPHONE HCL 1 MG/ML IJ SOLN
0.2500 mg | INTRAMUSCULAR | Status: DC | PRN
  Administered 2017-04-07 (×4): 0.5 mg via INTRAVENOUS

## 2017-04-07 MED ORDER — SENNOSIDES-DOCUSATE SODIUM 8.6-50 MG PO TABS: 1.0000 | ORAL_TABLET | Freq: Every evening | ORAL | Status: DC | PRN

## 2017-04-07 MED ORDER — METOCLOPRAMIDE HCL 5 MG/ML IJ SOLN: 5.0000 mg | Freq: Three times a day (TID) | INTRAMUSCULAR | Status: DC | PRN

## 2017-04-07 MED ORDER — PROPOFOL 1000 MG/100ML IV EMUL
INTRAVENOUS | Status: AC
  Filled 2017-04-07: qty 100

## 2017-04-07 MED ORDER — TRANEXAMIC ACID 1000 MG/10ML IV SOLN
2000.0000 mg | INTRAVENOUS | Status: DC
  Filled 2017-04-07: qty 20

## 2017-04-07 MED ORDER — OXYCODONE HCL 5 MG PO TABS: 5.0000 mg | ORAL_TABLET | ORAL | Status: DC | PRN

## 2017-04-07 MED ORDER — TIZANIDINE HCL 4 MG PO TABS
2.0000 mg | ORAL_TABLET | Freq: Four times a day (QID) | ORAL | Status: DC | PRN
  Administered 2017-04-08 – 2017-04-10 (×5): 2 mg via ORAL
  Filled 2017-04-07 (×5): qty 1

## 2017-04-07 MED ORDER — MIDAZOLAM HCL 5 MG/5ML IJ SOLN
INTRAMUSCULAR | Status: DC | PRN
  Administered 2017-04-07 (×2): 1 mg via INTRAVENOUS

## 2017-04-07 MED ORDER — LACTATED RINGERS IV SOLN
INTRAVENOUS | Status: DC | PRN
  Administered 2017-04-07: 07:00:00 via INTRAVENOUS

## 2017-04-07 MED ORDER — METOPROLOL SUCCINATE ER 25 MG PO TB24
ORAL_TABLET | ORAL | Status: AC
  Filled 2017-04-07: qty 2

## 2017-04-07 MED ORDER — BUPIVACAINE LIPOSOME 1.3 % IJ SUSP
INTRAMUSCULAR | Status: DC | PRN
  Administered 2017-04-07: 20 mL

## 2017-04-07 MED ORDER — VANCOMYCIN HCL IN DEXTROSE 1-5 GM/200ML-% IV SOLN
1000.0000 mg | Freq: Two times a day (BID) | INTRAVENOUS | Status: AC
  Administered 2017-04-07: 1000 mg via INTRAVENOUS
  Filled 2017-04-07: qty 200

## 2017-04-07 MED ORDER — OXYCODONE HCL 5 MG PO TABS
10.0000 mg | ORAL_TABLET | ORAL | Status: DC | PRN
  Administered 2017-04-07 – 2017-04-10 (×17): 10 mg via ORAL
  Filled 2017-04-07 (×16): qty 2

## 2017-04-07 MED ORDER — ONDANSETRON HCL 4 MG/2ML IJ SOLN
INTRAMUSCULAR | Status: DC | PRN
  Administered 2017-04-07: 4 mg via INTRAVENOUS

## 2017-04-07 MED ORDER — IRBESARTAN 300 MG PO TABS
300.0000 mg | ORAL_TABLET | Freq: Every day | ORAL | Status: DC
  Administered 2017-04-07 – 2017-04-10 (×4): 300 mg via ORAL
  Filled 2017-04-07 (×4): qty 1

## 2017-04-07 MED ORDER — TRANEXAMIC ACID 1000 MG/10ML IV SOLN
INTRAVENOUS | Status: AC | PRN
  Administered 2017-04-07: 2000 mg via TOPICAL

## 2017-04-07 MED ORDER — DIPHENHYDRAMINE HCL 12.5 MG/5ML PO ELIX: 12.5000 mg | ORAL_SOLUTION | ORAL | Status: DC | PRN

## 2017-04-07 MED ORDER — BUPIVACAINE-EPINEPHRINE (PF) 0.5% -1:200000 IJ SOLN
INTRAMUSCULAR | Status: AC
  Filled 2017-04-07: qty 30

## 2017-04-07 MED ORDER — MENTHOL 3 MG MT LOZG: 1.0000 | LOZENGE | OROMUCOSAL | Status: DC | PRN

## 2017-04-07 MED ORDER — ATORVASTATIN CALCIUM 40 MG PO TABS
40.0000 mg | ORAL_TABLET | Freq: Every day | ORAL | Status: DC
  Administered 2017-04-07 – 2017-04-09 (×3): 40 mg via ORAL
  Filled 2017-04-07 (×3): qty 1

## 2017-04-07 MED ORDER — FENTANYL CITRATE (PF) 250 MCG/5ML IJ SOLN
INTRAMUSCULAR | Status: AC
  Filled 2017-04-07: qty 5

## 2017-04-07 SURGICAL SUPPLY — 58 items
BLADE SAW SAG 73X25 THK (BLADE) ×1
BLADE SAW SGTL 73X25 THK (BLADE) ×2 IMPLANT
BRUSH FEMORAL CANAL (MISCELLANEOUS) IMPLANT
CAPT HIP TOTAL 2 ×3 IMPLANT
COVER SURGICAL LIGHT HANDLE (MISCELLANEOUS) ×3 IMPLANT
DRAPE INCISE IOBAN 66X45 STRL (DRAPES) IMPLANT
DRAPE ORTHO SPLIT 77X108 STRL (DRAPES) ×4
DRAPE SURG ORHT 6 SPLT 77X108 (DRAPES) ×2 IMPLANT
DRAPE U-SHAPE 47X51 STRL (DRAPES) ×3 IMPLANT
DRSG ADAPTIC 3X8 NADH LF (GAUZE/BANDAGES/DRESSINGS) ×3 IMPLANT
DRSG PAD ABDOMINAL 8X10 ST (GAUZE/BANDAGES/DRESSINGS) ×6 IMPLANT
DURAPREP 26ML APPLICATOR (WOUND CARE) ×3 IMPLANT
ELECT BLADE 4.0 EZ CLEAN MEGAD (MISCELLANEOUS) ×3
ELECT BLADE 6.5 EXT (BLADE) IMPLANT
ELECT CAUTERY BLADE 6.4 (BLADE) ×3 IMPLANT
ELECT REM PT RETURN 9FT ADLT (ELECTROSURGICAL) ×3
ELECTRODE BLDE 4.0 EZ CLN MEGD (MISCELLANEOUS) ×1 IMPLANT
ELECTRODE REM PT RTRN 9FT ADLT (ELECTROSURGICAL) ×1 IMPLANT
FACESHIELD WRAPAROUND (MASK) ×6 IMPLANT
GAUZE SPONGE 4X4 12PLY STRL (GAUZE/BANDAGES/DRESSINGS) ×3 IMPLANT
GLOVE BIOGEL PI IND STRL 8 (GLOVE) ×2 IMPLANT
GLOVE BIOGEL PI INDICATOR 8 (GLOVE) ×4
GLOVE ORTHO TXT STRL SZ7.5 (GLOVE) ×6 IMPLANT
GLOVE SURG ORTHO 8.0 STRL STRW (GLOVE) ×6 IMPLANT
GOWN STRL REUS W/ TWL LRG LVL3 (GOWN DISPOSABLE) ×1 IMPLANT
GOWN STRL REUS W/ TWL XL LVL3 (GOWN DISPOSABLE) ×1 IMPLANT
GOWN STRL REUS W/TWL 2XL LVL3 (GOWN DISPOSABLE) ×3 IMPLANT
GOWN STRL REUS W/TWL LRG LVL3 (GOWN DISPOSABLE) ×2
GOWN STRL REUS W/TWL XL LVL3 (GOWN DISPOSABLE) ×2
HANDPIECE INTERPULSE COAX TIP (DISPOSABLE)
HOOD PEEL AWAY FACE SHEILD DIS (HOOD) ×3 IMPLANT
IMMOBILIZER KNEE 22 UNIV (SOFTGOODS) ×3 IMPLANT
KIT BASIN OR (CUSTOM PROCEDURE TRAY) ×3 IMPLANT
KIT ROOM TURNOVER OR (KITS) ×3 IMPLANT
MANIFOLD NEPTUNE II (INSTRUMENTS) ×3 IMPLANT
NEEDLE 22X1 1/2 (OR ONLY) (NEEDLE) ×6 IMPLANT
NEEDLE MAYO TROCAR (NEEDLE) IMPLANT
NS IRRIG 1000ML POUR BTL (IV SOLUTION) ×3 IMPLANT
PACK TOTAL JOINT (CUSTOM PROCEDURE TRAY) ×3 IMPLANT
PACK UNIVERSAL I (CUSTOM PROCEDURE TRAY) ×3 IMPLANT
PAD ARMBOARD 7.5X6 YLW CONV (MISCELLANEOUS) ×6 IMPLANT
PRESSURIZER FEMORAL UNIV (MISCELLANEOUS) IMPLANT
SET HNDPC FAN SPRY TIP SCT (DISPOSABLE) IMPLANT
STAPLER VISISTAT 35W (STAPLE) ×3 IMPLANT
SUCTION FRAZIER HANDLE 10FR (MISCELLANEOUS) ×2
SUCTION TUBE FRAZIER 10FR DISP (MISCELLANEOUS) ×1 IMPLANT
SUT ETHIBOND 2 V 37 (SUTURE) ×3 IMPLANT
SUT VIC AB 0 CT1 27 (SUTURE) ×2
SUT VIC AB 0 CT1 27XBRD ANBCTR (SUTURE) ×1 IMPLANT
SUT VIC AB 2-0 CT1 27 (SUTURE) ×4
SUT VIC AB 2-0 CT1 TAPERPNT 27 (SUTURE) ×2 IMPLANT
SYR CONTROL 10ML LL (SYRINGE) ×6 IMPLANT
TAPE CLOTH SURG 6X10 WHT LF (GAUZE/BANDAGES/DRESSINGS) ×3 IMPLANT
TOWEL OR 17X24 6PK STRL BLUE (TOWEL DISPOSABLE) ×3 IMPLANT
TOWEL OR 17X26 10 PK STRL BLUE (TOWEL DISPOSABLE) ×3 IMPLANT
TOWER CARTRIDGE SMART MIX (DISPOSABLE) IMPLANT
TRAY CATH 16FR W/PLASTIC CATH (SET/KITS/TRAYS/PACK) IMPLANT
WATER STERILE IRR 1000ML POUR (IV SOLUTION) ×3 IMPLANT

## 2017-04-07 NOTE — Discharge Instructions (Signed)

## 2017-04-07 NOTE — Transfer of Care (Signed)
Immediate Anesthesia Transfer of Care Note  Patient: Richard Parrish  Procedure(s) Performed: LEFT TOTAL HIP ARTHROPLASTY (Left Hip)  Patient Location: PACU  Anesthesia Type:MAC and Spinal  Level of Consciousness: awake, alert , oriented and sedated  Airway & Oxygen Therapy: Patient Spontanous Breathing and Patient connected to nasal cannula oxygen  Post-op Assessment: Report given to RN, Post -op Vital signs reviewed and stable and Patient moving all extremities  Post vital signs: Reviewed and stable  Last Vitals:  Vitals:   04/07/17 0607  BP: 128/62  Pulse: (!) 57  Resp: 20  Temp: (!) 36.4 C  SpO2: 98%    Last Pain:  Vitals:   04/07/17 0623  TempSrc:   PainSc: 6       Patients Stated Pain Goal: 3 (38/25/05 3976)  Complications: No apparent anesthesia complications

## 2017-04-07 NOTE — Anesthesia Procedure Notes (Signed)
Spinal  Patient location during procedure: OR Start time: 04/07/2017 7:32 AM End time: 04/07/2017 7:38 AM Staffing Anesthesiologist: Albertha Ghee, MD Performed: anesthesiologist  Preanesthetic Checklist Completed: patient identified, surgical consent, pre-op evaluation, timeout performed, IV checked, risks and benefits discussed and monitors and equipment checked Spinal Block Patient position: sitting Prep: DuraPrep Patient monitoring: cardiac monitor, continuous pulse ox and blood pressure Approach: midline Location: L3-4 Injection technique: single-shot Needle Needle type: Pencan  Needle gauge: 24 G Needle length: 9 cm Assessment Sensory level: T10 Additional Notes Functioning IV was confirmed and monitors were applied. Sterile prep and drape, including hand hygiene and sterile gloves were used. The patient was positioned and the spine was prepped. The skin was anesthetized with lidocaine.  Free flow of clear CSF was obtained prior to injecting local anesthetic into the CSF.  The spinal needle aspirated freely following injection.  The needle was carefully withdrawn.  The patient tolerated the procedure well.

## 2017-04-07 NOTE — Progress Notes (Signed)
Orthopedic Tech Progress Note Patient Details:  Richard Parrish [birth year] [id number]  Over head frame applied.    Patient ID: Richard Parrish, male   DOB: 03/14/1950, 67 y.o.   MRN: [id number]   Richard Parrish, Richard Parrish 04/07/2017, 2:47 PM

## 2017-04-07 NOTE — Care Management Note (Signed)
Case Management Note  Patient Details  Name: Richard Parrish MRN: [id number] Date of Birth: 11/10/1950  Subjective/Objective:     Left THA               Action/Plan: Spoke to pt and husband. Offered choice for HH/list. Pt agreeable to [facility]. Pt states he has RW at home. Pt will need 3n1 for home. Will contact [facility] for 3n1 for home to be delivered to room prior to dc.   Expected Discharge Date:  04/09/17               Expected Discharge Plan:  [place]  In-House Referral:  NA  Discharge planning Services  CM Consult  Post Acute Care Choice:  Home Health Choice offered to:  Patient  DME Arranged:  3-N-1 DME Agency:  [place]:  PT [place] Agency:  [facility] (formerly [facility])  Status of Service:  Completed, signed off  If discussed at [employer] of Stay Meetings, dates discussed:    Additional Comments:  Erenest Rasher, RN 04/07/2017, 6:11 PM

## 2017-04-07 NOTE — Progress Notes (Signed)
Patient admitted from PACU. Family at bedside. Stable vital signs, no distress. Will continue to monitor closely.

## 2017-04-07 NOTE — Anesthesia Preprocedure Evaluation (Signed)
Anesthesia Evaluation  Patient identified by MRN, date of birth, ID band Patient awake    Reviewed: Allergy & Precautions, H&P , NPO status , Patient's Chart, lab work & pertinent test results  Airway Mallampati: II   Neck ROM: full    Dental   Pulmonary sleep apnea , former smoker,  Pulmonary fibrosis   breath sounds clear to auscultation       Cardiovascular hypertension, + CAD, + Past MI, + CABG and + Peripheral Vascular Disease   Rhythm:regular Rate:Normal  Last dose of Plavix was 03/31/2017   Neuro/Psych PSYCHIATRIC DISORDERS Anxiety Depression    GI/Hepatic GERD  ,  Endo/Other    Renal/GU      Musculoskeletal  (+) Arthritis ,   Abdominal   Peds  Hematology   Anesthesia Other Findings   Reproductive/Obstetrics                             Anesthesia Physical Anesthesia Plan  ASA: III  Anesthesia Plan: MAC and Spinal   Post-op Pain Management:    Induction: Intravenous  PONV Risk Score and Plan: 1 and Ondansetron, Treatment may vary due to age or medical condition, Propofol infusion and Midazolam  Airway Management Planned: Simple Face Mask  Additional Equipment:   Intra-op Plan:   Post-operative Plan:   Informed Consent: I have reviewed the patients History and Physical, chart, labs and discussed the procedure including the risks, benefits and alternatives for the proposed anesthesia with the patient or authorized representative who has indicated his/her understanding and acceptance.     Plan Discussed with: CRNA, Anesthesiologist and Surgeon  Anesthesia Plan Comments:         Anesthesia Quick Evaluation

## 2017-04-07 NOTE — Interval H&P Note (Signed)
History and Physical Interval Note:  04/07/2017 7:26 AM  Richard Parrish  has presented today for surgery, with the diagnosis of OA LEFT HIP  The various methods of treatment have been discussed with the patient and family. After consideration of risks, benefits and other options for treatment, the patient has consented to  Procedure(s): LEFT TOTAL HIP ARTHROPLASTY (Left) as a surgical intervention .  The patient's history has been reviewed, patient examined, no change in status, stable for surgery.  I have reviewed the patient's chart and labs.  Questions were answered to the patient's satisfaction.     Yvette Rack

## 2017-04-07 NOTE — Brief Op Note (Signed)
04/07/2017  9:52 AM  PATIENT:  Richard Parrish  67 y.o. male  PRE-OPERATIVE DIAGNOSIS:  OA LEFT HIP  POST-OPERATIVE DIAGNOSIS:  OA LEFT HIP  PROCEDURE:  Procedure(s): LEFT TOTAL HIP ARTHROPLASTY (Left)  SURGEON:  Surgeon(s) and Role:    Earlie Server, MD - Primary  PHYSICIAN ASSISTANT: Chriss Czar, PA-C  ASSISTANTS:  ANESTHESIA:   local, spinal and IV sedation  EBL:  600 mL   BLOOD ADMINISTERED:none  DRAINS: none   LOCAL MEDICATIONS USED:  MARCAINE     SPECIMEN:  No Specimen  DISPOSITION OF SPECIMEN:  N/A  COUNTS:  YES  TOURNIQUET:  * No tourniquets in log *  DICTATION: .Other Dictation: Dictation Number unknown  PLAN OF CARE: Admit to inpatient   PATIENT DISPOSITION:  PACU - hemodynamically stable.   Delay start of Pharmacological VTE agent (>24hrs) due to surgical blood loss or risk of bleeding: yes

## 2017-04-07 NOTE — Evaluation (Signed)
Physical Therapy Evaluation Patient Details Name: Richard Parrish MRN: [id number] DOB: Dec 29, 1950 Today's Date: 04/07/2017   History of Present Illness  Pt is a 67 y/o male s/p L THA, posterior approach. PMH includes CAD s/p CABG, OSA on CPAP, HTN, PVD, pulmonary fibrosis, and R THA.   Clinical Impression  Pt is s/p surgery above with deficits below. Pt very guarded and limited by pain this session. Required min to mod A for mobility with RW. Educated about precautions and HEP. Anticipate pt will progress well once pain controlled. Will continue to follow acutely to maximize functional mobility independence and safety.     Follow Up Recommendations DC plan and follow up therapy as arranged by surgeon;Supervision for mobility/OOB    Equipment Recommendations  3in1 (PT)    Recommendations for Other Services       Precautions / Restrictions Precautions Precautions: Posterior Hip Precaution Booklet Issued: Yes (comment) Precaution Comments: REviewed posterior hip and supine ther ex. Limited tolerance secondary to pain.  Restrictions Weight Bearing Restrictions: Yes LLE Weight Bearing: Weight bearing as tolerated      Mobility  Bed Mobility Overal bed mobility: Needs Assistance Bed Mobility: Supine to Sit     Supine to sit: Mod assist     General bed mobility comments: Mod A for LLE assist and for scooting hips to EOB. Pt very guarded secondary to pain and required increased time to perform. Verbal cues for technique to maintain precautions.   Transfers Overall transfer level: Needs assistance Equipment used: Rolling walker (2 wheeled) Transfers: Sit to/from Stand Sit to Stand: Mod assist;From elevated surface         General transfer comment: Mod A for lift assist and steadying assist from elevated surface. Verbal cues for technique to maintain precautions and for safe hand placement.   Ambulation/Gait Ambulation/Gait assistance: Min assist Ambulation Distance (Feet):  5 Feet Assistive device: Rolling walker (2 wheeled) Gait Pattern/deviations: Step-to pattern;Decreased step length - left;Decreased step length - right;Decreased weight shift to left;Antalgic Gait velocity: Decreased  Gait velocity interpretation: Below normal speed for age/gender General Gait Details: Slow, antalgic gait. Min A for steadying assist. Very limited by pain, so distance limited to chair.   Stairs            Wheelchair Mobility    Modified Rankin (Stroke Patients Only)       Balance Overall balance assessment: Needs assistance Sitting-balance support: No upper extremity supported;Feet supported Sitting balance-Leahy Scale: Good     Standing balance support: Bilateral upper extremity supported;During functional activity Standing balance-Leahy Scale: Poor Standing balance comment: Reliant on BUE support.                              Pertinent Vitals/Pain Pain Assessment: Faces Faces Pain Scale: Hurts whole lot Pain Location: L hip  Pain Descriptors / Indicators: Aching;Operative site guarding Pain Intervention(s): Limited activity within patient's tolerance;Monitored during session;Repositioned    Home Living Family/patient expects to be discharged to:: Private residence Living Arrangements: Spouse/significant other Available Help at Discharge: Family;Available 24 hours/day Type of Home: House Home Access: Stairs to enter Entrance Stairs-Rails: Left Entrance Stairs-Number of Steps: 4 Home Layout: One level Home Equipment: Walker - 2 wheels;Walker - 4 wheels;Cane - single point;Shower seat      Prior Function Level of Independence: Independent         Comments: Was active and plays golf      Hand Dominance  Extremity/Trunk Assessment   Upper Extremity Assessment Upper Extremity Assessment: Defer to OT evaluation    Lower Extremity Assessment Lower Extremity Assessment: LLE deficits/detail LLE Deficits / Details:  Sensory in tact. Deficits consistent with post op pain and weakness. Able to perform ther ex below.     Cervical / Trunk Assessment Cervical / Trunk Assessment: Normal  Communication   Communication: No difficulties  Cognition Arousal/Alertness: Awake/alert Behavior During Therapy: WFL for tasks assessed/performed Overall Cognitive Status: Within Functional Limits for tasks assessed                                        General Comments General comments (skin integrity, edema, etc.): Pt's wife present during session.     Exercises Total Joint Exercises Ankle Circles/Pumps: AROM;Both;20 reps Quad Sets: AROM;Left;10 reps   Assessment/Plan    PT Assessment Patient needs continued PT services  PT Problem List Decreased strength;Decreased balance;Decreased activity tolerance;Decreased range of motion;Decreased mobility;Decreased knowledge of use of DME;Decreased knowledge of precautions;Pain       PT Treatment Interventions DME instruction;Gait training;Functional mobility training;Therapeutic activities;Stair training;Therapeutic exercise;Balance training;Neuromuscular re-education;Patient/family education    PT Goals (Current goals can be found in the Care Plan section)  Acute Rehab PT Goals Patient Stated Goal: to decrease pain  PT Goal Formulation: With patient Time For Goal Achievement: 04/21/17 Potential to Achieve Goals: Good    Frequency 7X/week   Barriers to discharge        Co-evaluation               AM-PAC PT "6 Clicks" Daily Activity  Outcome Measure Difficulty turning over in bed (including adjusting bedclothes, sheets and blankets)?: A Little Difficulty moving from lying on back to sitting on the side of the bed? : Unable Difficulty sitting down on and standing up from a chair with arms (e.g., wheelchair, bedside commode, etc,.)?: Unable Help needed moving to and from a bed to chair (including a wheelchair)?: A Little Help needed  walking in hospital room?: A Little Help needed climbing 3-5 steps with a railing? : A Lot 6 Click Score: 13    End of Session Equipment Utilized During Treatment: Gait belt;Left knee immobilizer Activity Tolerance: Patient limited by pain Patient left: in chair;with call bell/phone within reach;with family/visitor present Nurse Communication: Mobility status PT Visit Diagnosis: Unsteadiness on feet (R26.81);Other abnormalities of gait and mobility (R26.89);Pain Pain - Right/Left: Left Pain - part of body: Hip    Time: 1245-8099 PT Time Calculation (min) (ACUTE ONLY): 44 min   Charges:   PT Evaluation $PT Eval Low Complexity: 1 Low PT Treatments $Therapeutic Activity: 23-37 mins   PT G Codes:        Leighton Ruff, PT, DPT  Acute Rehabilitation Services  Pager: [phone]   Rudean Hitt 04/07/2017, 6:03 PM

## 2017-04-07 NOTE — Progress Notes (Signed)
Patient declined the use of a hospital CPAP machine.  Per patient and family he does not wear consistently at home.

## 2017-04-08 LAB — CBC
HCT: 34.6 % — ABNORMAL LOW (ref 39.0–52.0)
HEMOGLOBIN: 11.9 g/dL — AB (ref 13.0–17.0)
MCH: 32.2 pg (ref 26.0–34.0)
MCHC: 34.4 g/dL (ref 30.0–36.0)
MCV: 93.8 fL (ref 78.0–100.0)
Platelets: 299 10*3/uL (ref 150–400)
RBC: 3.69 MIL/uL — AB (ref 4.22–5.81)
RDW: 12.8 % (ref 11.5–15.5)
WBC: 10.1 10*3/uL (ref 4.0–10.5)

## 2017-04-08 LAB — BASIC METABOLIC PANEL
Anion gap: 10 (ref 5–15)
BUN: 15 mg/dL (ref 6–20)
CHLORIDE: 103 mmol/L (ref 101–111)
CO2: 25 mmol/L (ref 22–32)
CREATININE: 1 mg/dL (ref 0.61–1.24)
Calcium: 8.8 mg/dL — ABNORMAL LOW (ref 8.9–10.3)
GFR calc non Af Amer: >60 mL/min (ref >60)
Glucose, Bld: 115 mg/dL — ABNORMAL HIGH (ref 65–99)
POTASSIUM: 3.8 mmol/L (ref 3.5–5.1)
SODIUM: 138 mmol/L (ref 135–145)

## 2017-04-08 NOTE — Evaluation (Signed)
Occupational Therapy Evaluation Patient Details Name: Richard Parrish MRN: [id number] DOB: 01-13-1951 Today's Date: 04/08/2017    History of Present Illness Pt is a 67 y/o male s/p L THA, posterior approach. PMH includes CAD s/p CABG, OSA on CPAP, HTN, PVD, pulmonary fibrosis, and R THA.    Clinical Impression   Pt was reliant on use of a sock aid, reacher and long shoe horn prior to admission to perform LB dressing. He presents with increased hip pain, generalized weakness and poor standing balance with inability to release walker in static standing. He needs set up to moderate assistance for ADL. Reviewed use of AE adhering to posterior hip precautions and educated in use of 3 in 1 over toilet. Demonstrated safe shower transfer. Wife participated in session. Will follow acutely. Do not anticipate pt will need post acute OT.    Follow Up Recommendations  No OT follow up    Equipment Recommendations  3 in 1 bedside commode    Recommendations for Other Services       Precautions / Restrictions Precautions Precautions: Posterior Hip;Fall Precaution Booklet Issued: Yes (comment) Precaution Comments: educated in posterior hip precautions related to ADL Restrictions Weight Bearing Restrictions: Yes LLE Weight Bearing: Weight bearing as tolerated      Mobility Bed Mobility Overal bed mobility: Needs Assistance Bed Mobility: Supine to Sit;Sit to Supine     Supine to sit: Mod assist Sit to supine: Min assist   General bed mobility comments: pt limited by pain, mod assist to achieve EOB, min assist for return to supine  Transfers Overall transfer level: Needs assistance Equipment used: Rolling walker (2 wheeled) Transfers: Sit to/from Stand Sit to Stand: From elevated surface;Min assist         General transfer comment: assist to rise and steady, cues for technique, assist to control descent    Balance Overall balance assessment: Needs assistance Sitting-balance support:  No upper extremity supported;Feet supported Sitting balance-Leahy Scale: Good     Standing balance support: During functional activity Standing balance-Leahy Scale: Poor Standing balance comment: Reliant on BUE support.                            ADL either performed or assessed with clinical judgement   ADL Overall ADL's : Needs assistance/impaired Eating/Feeding: Independent;Sitting   Grooming: Wash/dry hands;Sitting;Set up   Upper Body Bathing: Set up;With adaptive equipment;Sitting   Lower Body Bathing: Sit to/from stand;With adaptive equipment;Moderate assistance Lower Body Bathing Details (indicate cue type and reason): pt educated in use of long handled bath sponge Upper Body Dressing : Set up;Sitting   Lower Body Dressing: Sit to/from stand;Moderate assistance Lower Body Dressing Details (indicate cue type and reason): instructed in compensatory strategies and use of AE adhering to hip precautions Toilet Transfer: Min guard;Ambulation;RW Toilet Transfer Details (indicate cue type and reason): instructed in use of 3 in 1 over toilet to elevate       Tub/Shower Transfer Details (indicate cue type and reason): demonstrated technique Functional mobility during ADLs: Min guard;Rolling walker General ADL Comments: Pt limited by pain and inability to release walker in static standing to groom at sink or manage pants.     Vision Patient Visual Report: No change from baseline       Perception     Praxis      Pertinent Vitals/Pain Pain Assessment: Faces Faces Pain Scale: Hurts whole lot Pain Location: L hip  Pain Descriptors / Indicators:  Aching;Operative site guarding Pain Intervention(s): Repositioned;RN gave pain meds during session;Monitored during session     Hand Dominance Right   Extremity/Trunk Assessment Upper Extremity Assessment Upper Extremity Assessment: Overall WFL for tasks assessed   Lower Extremity Assessment Lower Extremity  Assessment: Defer to PT evaluation   Cervical / Trunk Assessment Cervical / Trunk Assessment: Normal   Communication Communication Communication: No difficulties   Cognition Arousal/Alertness: Awake/alert Behavior During Therapy: WFL for tasks assessed/performed Overall Cognitive Status: Within Functional Limits for tasks assessed                                 General Comments: some mild confusion at times, may be med related   General Comments  pt wife present during session.     Exercises Exercises: Total Joint Total Joint Exercises Hip ABduction/ADduction: AAROM;10 reps(AB only. )   Shoulder Instructions      Home Living Family/patient expects to be discharged to:: Private residence Living Arrangements: Spouse/significant other Available Help at Discharge: Family;Available 24 hours/day Type of Home: House Home Access: Stairs to enter [employer] of Steps: 4 Entrance Stairs-Rails: Left Home Layout: One level     Bathroom Shower/Tub: Occupational psychologist: Standard     Home Equipment: Environmental consultant - 2 wheels;Walker - 4 wheels;Cane - single point;Bedside commode;Adaptive equipment Adaptive Equipment: Reacher;Sock aid;Long-handled shoe horn        Prior Functioning/Environment Level of Independence: Independent with assistive device(s)        Comments: Was active and plays golf, hunts. Used AE routinely for LB dressing.        OT Problem List: Decreased strength;Decreased activity tolerance;Impaired balance (sitting and/or standing);Decreased knowledge of use of DME or AE;Decreased knowledge of precautions;Pain      OT Treatment/Interventions: Self-care/ADL training;DME and/or AE instruction;Patient/family education;Balance training;Therapeutic activities    OT Goals(Current goals can be found in the care plan section) Acute Rehab OT Goals Patient Stated Goal: to decrease pain  OT Goal Formulation: With patient Time For  Goal Achievement: 04/22/17 Potential to Achieve Goals: Good ADL Goals Pt Will Perform Grooming: with supervision;standing Pt Will Perform Lower Body Dressing: with supervision;with adaptive equipment;sit to/from stand Pt Will Transfer to Toilet: with supervision;ambulating;bedside commode(over toilet) Pt Will Perform Toileting - Clothing Manipulation and hygiene: with supervision;sit to/from stand Pt Will Perform Tub/Shower Transfer: with supervision;ambulating;3 in 1;rolling walker Additional ADL Goal #1: Pt will generalize posterior hip precautions in ADL independently.  OT Frequency: Min 2X/week   Barriers to D/C:            Co-evaluation              AM-PAC PT "6 Clicks" Daily Activity     Outcome Measure Help from another person eating meals?: None Help from another person taking care of personal grooming?: A Little Help from another person toileting, which includes using toliet, bedpan, or urinal?: A Lot Help from another person bathing (including washing, rinsing, drying)?: A Lot Help from another person to put on and taking off regular upper body clothing?: None Help from another person to put on and taking off regular lower body clothing?: A Lot 6 Click Score: 17   End of Session Equipment Utilized During Treatment: Gait belt;Rolling walker Nurse Communication: Patient requests pain meds  Activity Tolerance: Patient limited by pain Patient left: in bed;with call bell/phone within reach;with family/visitor present  OT Visit Diagnosis: Unsteadiness on feet (R26.81);Other abnormalities of  gait and mobility (R26.89);Pain;Muscle weakness (generalized) (M62.81)                Time: 4784-1282 OT Time Calculation (min): 41 min Charges:  OT General Charges $OT Visit: 1 Visit OT Evaluation $OT Eval Moderate Complexity: 1 Mod OT Treatments $Self Care/Home Management : 8-22 mins G-Codes:     05-07-17 Nestor Lewandowsky, OTR/L Pager: [phone]  Mystie Ormand, Haze Boyden May 07, 2017, 3:11 PM

## 2017-04-08 NOTE — Anesthesia Postprocedure Evaluation (Signed)
Anesthesia Post Note  Patient: Richard Parrish  Procedure(s) Performed: LEFT TOTAL HIP ARTHROPLASTY (Left Hip)     Patient location during evaluation: PACU Anesthesia Type: MAC and Spinal Level of consciousness: oriented and awake and alert Pain management: pain level controlled Vital Signs Assessment: post-procedure vital signs reviewed and stable Respiratory status: spontaneous breathing, respiratory function stable and patient connected to nasal cannula oxygen Cardiovascular status: blood pressure returned to baseline and stable Postop Assessment: no headache, no backache and no apparent nausea or vomiting Anesthetic complications: no    Last Vitals:  Vitals:   04/08/17 0106 04/08/17 0607  BP: (!) 123/57 127/60  Pulse: 75 72  Resp: 16 16  Temp: 37.9 C 37.3 C  SpO2: 93% 97%    Last Pain:  Vitals:   04/08/17 0607  TempSrc: Oral  PainSc:                  [name]

## 2017-04-08 NOTE — Op Note (Signed)
NAME:  Richard Parrish, Richard Parrish NO.:  [id number]  MEDICAL RECORD NO.:  [id number]  LOCATION:  [id number]                        FACILITY:  [facility]  PHYSICIAN:  Lockie Pares, M.D.    DATE OF BIRTH:  [birth year]  DATE OF PROCEDURE: DATE OF DISCHARGE:                              OPERATIVE REPORT   PREOPERATIVE DIAGNOSIS:  Severe osteoarthritis of left hip.  POSTOPERATIVE DIAGNOSIS:  Severe osteoarthritis of left hip.  OPERATION PERFORMED:  Left total hip replacement (DePuy AML, large stature is 13.5 mm stem with a high offset and a 36-mm hip ball ceramic head +5 mm neck length, Pinnacle acetabular lining +4, 10-degree with Pinnacle Gription acetabular 54-mm cup).  SURGEON:  W.D. French Ana, MD.  ASSISTANTChriss Czar, PA-C.  ANESTHESIA:  Spinal anesthetic.  BLOOD LOSS:  600.  DESCRIPTION OF PROCEDURE:  With lateral positioning of the hip, a posterior approach to the hip was made with splitting of the external rotators and the iliotibial band.  T capsulotomy was made in the hip. We cut the femoral head about 1 fingerbreadth above the lesser trochanter, followed by progressive reaming and rasping to accept the 13.5-mm large stature stem.  This was followed by cleaning the acetabulum from the labral tissue and placing a wing retractor as well. We then progressively reamed the acetabulum for a millimeter, and under- reamed 53 for a 54 cup with about 40 to 45 degrees of abduction and 15 to 20 degrees of anteversion.  The final cup was inserted with a trial liner using the broach as a trial.  We settled on the high-offset option for stability as well as based on the preoperative x-ray using the +5-mm neck length.  The final components were inserted with the final cup followed by the stem.  A good press-fit was obtained.  Stability was deemed to be excellent.  The prosthesis could only be dislocated with extreme flexion, maximal adduction, and then could be internally  rotated to 75 to 80 degrees internally before there was any tendency to sublux or dislocate.  The wound was copiously irrigated.  The patient had moderate blood loss secondary to still being anticoagulated with the aspirin.  WOUND CLOSURE:  We closed the capsule with #1 Ethibond, the iliotibial band and gluteus maximus with #1 Ethibond, the subcutaneous tissue with 2-0 Vicryl, and the skin with a stapling device.  CONDITION:  He was taken to recovery room in stable condition.     Lockie Pares, M.D.     WDC/MEDQ  D:  04/07/2017  T:  04/08/2017  Job:  [id number]

## 2017-04-08 NOTE — Progress Notes (Signed)
SPORTS MEDICINE AND JOINT REPLACEMENT  Lara Mulch, MD    Carlyon Shadow, PA-C [place], [place], [place]  [id number]                             [phone]   PROGRESS NOTE  Subjective:  negative for Chest Pain  negative for Shortness of Breath  negative for Nausea/Vomiting   negative for Calf Pain  negative for Bowel Movement   Tolerating Diet: yes         Patient reports pain as 3 on 0-10 scale.    Objective: Vital signs in last 24 hours:    Patient Vitals for the past 24 hrs:  BP Temp Temp src Pulse Resp SpO2  04/08/17 0607 127/60 99.2 F (37.3 C) Oral 72 16 97 %  04/08/17 0106 (!) 123/57 100.3 F (37.9 C) Oral 75 16 93 %  04/07/17 2142 (!) 90/51 (!) 97.4 F (36.3 C) Oral 75 18 96 %  04/07/17 1353 130/74 98.1 F (36.7 C) Oral 65 15 98 %  04/07/17 1315 - - - (!) 56 11 96 %  04/07/17 1310 138/76 (!) 97.4 F (36.3 C) - (!) 59 20 100 %  04/07/17 1307 104/67 - - - 12 100 %  04/07/17 1253 121/76 - - (!) 55 16 100 %  04/07/17 1245 - - - (!) 55 15 99 %  04/07/17 1238 (!) 142/76 - - (!) 54 13 99 %  04/07/17 1223 127/79 - - (!) 57 16 100 %  04/07/17 1207 138/69 - - (!) 59 15 99 %  04/07/17 1202 116/70 - - (!) 55 10 100 %  04/07/17 1200 - - - (!) 56 16 100 %  04/07/17 1145 (!) 146/70 - - (!) 56 13 97 %  04/07/17 1130 135/62 - - (!) 58 12 95 %  04/07/17 1115 138/68 - - (!) 56 14 96 %  04/07/17 1100 140/74 - - (!) 59 17 99 %  04/07/17 1045 127/73 - - (!) 52 16 97 %  04/07/17 1030 125/68 - - (!) 56 12 96 %  04/07/17 1015 124/65 - - (!) 58 12 97 %  04/07/17 1000 128/67 - - (!) 59 13 96 %  04/07/17 0950 123/70 (!) 97.4 F (36.3 C) - 65 16 98 %    @flow {1959:LAST@   Intake/Output from previous day:   02/08 0701 - 02/09 0700 In: 4690.5 [P.O.:420; I.V.:3160.5] Out: 3450 [Urine:2850]   Intake/Output this shift:   No intake/output data recorded.   Intake/Output      02/08 0701 - 02/09 0700 02/09 0701 - 02/10 0700   P.O. 420    I.V. 3160.5    IV Piggyback  1110    Total Intake 4690.5    Urine 2850    Blood 600    Total Output 3450    Net +1240.5            LABORATORY DATA: Recent Labs    04/08/17 0605  WBC 10.1  HGB 11.9*  HCT 34.6*  PLT 299   No results for input(s): NA, K, CL, CO2, BUN, CREATININE, GLUCOSE, CALCIUM in the last 168 hours. Lab Results  Component Value Date   INR 1.00 03/24/2017    Examination:  General appearance: alert, cooperative and no distress Extremities: extremities normal, atraumatic, no cyanosis or edema  Wound Exam: clean, dry, intact   Drainage:  None: wound tissue dry  Motor  Exam: Quadriceps and Hamstrings Intact  Sensory Exam: Superficial Peroneal, Deep Peroneal and Tibial normal   Assessment:    1 Day Post-Op  Procedure(s) (LRB): LEFT TOTAL HIP ARTHROPLASTY (Left)  ADDITIONAL DIAGNOSIS:  Active Problems:   Degenerative joint disease of left hip     Plan: Physical Therapy as ordered Touch Down Weight Bearing (TDWB)  DVT Prophylaxis:  eliquis  DISCHARGE PLAN: Home  DISCHARGE NEEDS: HHPT   Patient doing well, will continue to monitor in PT with expect D/C home tomorrow         Donia Ast 04/08/2017, 7:25 AM

## 2017-04-08 NOTE — Plan of Care (Signed)
  Nutrition: Adequate nutrition will be maintained 04/08/2017 1141 - Progressing by Williams Che, RN   Elimination: Will not experience complications related to bowel motility 04/08/2017 1141 - Progressing by Williams Che, RN   Pain Managment: General experience of comfort will improve 04/08/2017 1141 - Progressing by Williams Che, RN   Safety: Ability to remain free from injury will improve 04/08/2017 1141 - Progressing by Williams Che, RN

## 2017-04-08 NOTE — Progress Notes (Signed)
Physical Therapy Treatment Patient Details Name: Richard Parrish MRN: [id number] DOB: 02/14/1951 Today's Date: 04/08/2017    History of Present Illness Pt is a 67 y/o male s/p L THA, posterior approach. PMH includes CAD s/p CABG, OSA on CPAP, HTN, PVD, pulmonary fibrosis, and R THA.     PT Comments    Patient has progressed very slowly today. Both sessions limited by high pain and an intolerance to mobility. Very slow and laborious  to rise from supine to sit, and has only tolerated 15 feet of ambulation in hospital room.   Patients strength and stability inside RW is good and requires min guarding, anticipate patient will do once pain is better controlled. Hope to progress functional mobility in next PT visits as patient still has stair training to achieve.     Follow Up Recommendations  DC plan and follow up therapy as arranged by surgeon;Supervision for mobility/OOB     Equipment Recommendations  3in1 (PT)    Recommendations for Other Services       Precautions / Restrictions Precautions Precautions: Posterior Hip;Fall Precaution Booklet Issued: Yes (comment) Precaution Comments: reveiwed posterior precautions patients wife can recall 3/3, patient 2/3.  Restrictions Weight Bearing Restrictions: Yes LLE Weight Bearing: Weight bearing as tolerated    Mobility  Bed Mobility Overal bed mobility: Needs Assistance Bed Mobility: Supine to Sit;Sit to Supine     Supine to sit: Mod assist Sit to supine: Min assist   General bed mobility comments: pt limited by pain, mod assist to achieve EOB, min assist for return to supine  Transfers Overall transfer level: Needs assistance Equipment used: Rolling walker (2 wheeled) Transfers: Sit to/from Stand Sit to Stand: From elevated surface;Min assist         General transfer comment: assist to rise and steady, cues for technique, assist to control descent  Ambulation/Gait Ambulation/Gait assistance: Min assist Ambulation  Distance (Feet): 10 Feet Assistive device: Rolling walker (2 wheeled) Gait Pattern/deviations: Step-to pattern;Decreased step length - left;Decreased step length - right;Decreased weight shift to left;Antalgic Gait velocity: decreased   General Gait Details: short distance ambulation in hospital room. extremly slow and painful, with seated rest break due to arm fatigue. walked to couch and back to bed today and patient was exhausted from pain.    Stairs            Wheelchair Mobility    Modified Rankin (Stroke Patients Only)       Balance Overall balance assessment: Needs assistance Sitting-balance support: No upper extremity supported;Feet supported Sitting balance-Leahy Scale: Good     Standing balance support: During functional activity Standing balance-Leahy Scale: Poor Standing balance comment: Reliant on BUE support.                             Cognition Arousal/Alertness: Awake/alert Behavior During Therapy: WFL for tasks assessed/performed Overall Cognitive Status: Within Functional Limits for tasks assessed                                 General Comments: some mild confusion at times, may be med related      Exercises Total Joint Exercises Hip ABduction/ADduction: Standing;10 reps Marching in Standing: 10 reps Standing Hip Extension: 10 reps    General Comments General comments (skin integrity, edema, etc.): pts wife present during session       Pertinent Vitals/Pain Pain Assessment: Faces  Faces Pain Scale: Hurts whole lot Pain Location: L hip  Pain Descriptors / Indicators: Aching;Operative site guarding Pain Intervention(s): Monitored during session;Limited activity within patient's tolerance;Premedicated before session;Repositioned    Home Living Family/patient expects to be discharged to:: Private residence Living Arrangements: Spouse/significant other Available Help at Discharge: Family;Available 24 hours/day Type  of Home: House Home Access: Stairs to enter Entrance Stairs-Rails: Left Home Layout: One level Home Equipment: Walker - 2 wheels;Walker - 4 wheels;Cane - single point;Bedside commode;Adaptive equipment      Prior Function Level of Independence: Independent with assistive device(s)      Comments: Was active and plays golf, hunts. Used AE routinely for LB dressing.   PT Goals (current goals can now be found in the care plan section) Acute Rehab PT Goals Patient Stated Goal: to decrease pain  PT Goal Formulation: With patient Time For Goal Achievement: 04/21/17 Potential to Achieve Goals: Good Progress towards PT goals: Progressing toward goals    Frequency    7X/week      PT Plan Current plan remains appropriate    Co-evaluation              AM-PAC PT "6 Clicks" Daily Activity  Outcome Measure  Difficulty turning over in bed (including adjusting bedclothes, sheets and blankets)?: A Little Difficulty moving from lying on back to sitting on the side of the bed? : Unable Difficulty sitting down on and standing up from a chair with arms (e.g., wheelchair, bedside commode, etc,.)?: Unable Help needed moving to and from a bed to chair (including a wheelchair)?: A Little Help needed walking in hospital room?: A Little Help needed climbing 3-5 steps with a railing? : A Lot 6 Click Score: 13    End of Session Equipment Utilized During Treatment: Gait belt;Left knee immobilizer Activity Tolerance: Patient limited by pain Patient left: in chair;with call bell/phone within reach;with family/visitor present Nurse Communication: Mobility status PT Visit Diagnosis: Unsteadiness on feet (R26.81);Other abnormalities of gait and mobility (R26.89);Pain Pain - Right/Left: Left Pain - part of body: Hip     Time: 1624-1700 PT Time Calculation (min) (ACUTE ONLY): 36 min  Charges:  $Gait Training: 8-22 mins $Therapeutic Activity: 8-22 mins                    G Codes:       Reinaldo Berber, PT, DPT Acute Rehab Services Pager: [phone]     Reinaldo Berber 04/08/2017, 5:03 PM

## 2017-04-08 NOTE — Progress Notes (Signed)
Physical Therapy Treatment Patient Details Name: Richard Parrish MRN: [id number] DOB: 08/03/1950 Today's Date: 04/08/2017    History of Present Illness Pt is a 67 y/o male s/p L THA, posterior approach. PMH includes CAD s/p CABG, OSA on CPAP, HTN, PVD, pulmonary fibrosis, and R THA.     PT Comments    Patient progressing very slow with PT at this time. Session limited by high levels of pain and guarding to even slight mobility of leg. Able to slowly move to EOB with mod A, and tolerate standing with short steps to bedside chair. Reviewed hip precautions extensively with patient and wife who verbalize understanding. Discussed need for progression, at this point do not see patient managing return to home today and will need several more therapy sessions to successfully ambulate stairs. RN notified of high pain, will visit this PM around medication schedule.    Follow Up Recommendations  DC plan and follow up therapy as arranged by surgeon;Supervision for mobility/OOB     Equipment Recommendations  3in1 (PT)    Recommendations for Other Services       Precautions / Restrictions Precautions Precautions: Posterior Hip Precaution Booklet Issued: Yes (comment) Precaution Comments: REviewed posterior hip and supine ther ex. Limited tolerance secondary to pain.  Restrictions Weight Bearing Restrictions: Yes LLE Weight Bearing: Weight bearing as tolerated    Mobility  Bed Mobility Overal bed mobility: Needs Assistance Bed Mobility: Supine to Sit     Supine to sit: Mod assist     General bed mobility comments: mod A for LLE assist over EOB, using bed pad to scoot, and trunk supoprt to power up. Patient extremly painful and guarding. even slight touch to LLE has brings about guarding and moaning.   Transfers Overall transfer level: Needs assistance Equipment used: Rolling walker (2 wheeled) Transfers: Sit to/from Stand Sit to Stand: Min guard;From elevated surface          General transfer comment: min A to help power up,cues for hand placement. slightly elevated bed. able to tolerate standing for several minutes with practice weight shifting.   Ambulation/Gait Ambulation/Gait assistance: Min assist Ambulation Distance (Feet): 5 Feet Assistive device: Rolling walker (2 wheeled) Gait Pattern/deviations: Step-to pattern;Decreased step length - left;Decreased step length - right;Decreased weight shift to left;Antalgic Gait velocity: Decreased    General Gait Details: Slow, antalgic gait. Min A for steadying assist. Very limited by pain, so distance limited to chair.    Stairs            Wheelchair Mobility    Modified Rankin (Stroke Patients Only)       Balance Overall balance assessment: Needs assistance Sitting-balance support: No upper extremity supported;Feet supported Sitting balance-Leahy Scale: Good     Standing balance support: Bilateral upper extremity supported;During functional activity Standing balance-Leahy Scale: Poor Standing balance comment: Reliant on BUE support.                             Cognition Arousal/Alertness: Awake/alert Behavior During Therapy: WFL for tasks assessed/performed Overall Cognitive Status: Within Functional Limits for tasks assessed                                        Exercises Total Joint Exercises Hip ABduction/ADduction: AAROM;10 reps(AB only. )    General Comments General comments (skin integrity, edema, etc.): pt wife present  during session.       Pertinent Vitals/Pain Pain Assessment: Faces Faces Pain Scale: Hurts worst Pain Location: L hip  Pain Descriptors / Indicators: Aching;Operative site guarding Pain Intervention(s): Limited activity within patient's tolerance;Monitored during session;Premedicated before session    Home Living                      Prior Function            PT Goals (current goals can now be found in the care  plan section) Acute Rehab PT Goals Patient Stated Goal: to decrease pain  PT Goal Formulation: With patient Time For Goal Achievement: 04/21/17 Potential to Achieve Goals: Good Progress towards PT goals: Progressing toward goals    Frequency    7X/week      PT Plan Current plan remains appropriate    Co-evaluation              AM-PAC PT "6 Clicks" Daily Activity  Outcome Measure  Difficulty turning over in bed (including adjusting bedclothes, sheets and blankets)?: A Little Difficulty moving from lying on back to sitting on the side of the bed? : Unable Difficulty sitting down on and standing up from a chair with arms (e.g., wheelchair, bedside commode, etc,.)?: Unable Help needed moving to and from a bed to chair (including a wheelchair)?: A Little Help needed walking in hospital room?: A Little Help needed climbing 3-5 steps with a railing? : A Lot 6 Click Score: 13    End of Session Equipment Utilized During Treatment: Gait belt;Left knee immobilizer Activity Tolerance: Patient limited by pain Patient left: in chair;with call bell/phone within reach;with family/visitor present Nurse Communication: Mobility status PT Visit Diagnosis: Unsteadiness on feet (R26.81);Other abnormalities of gait and mobility (R26.89);Pain Pain - Right/Left: Left Pain - part of body: Hip     Time: 7505-1833 PT Time Calculation (min) (ACUTE ONLY): 42 min  Charges:  $Therapeutic Activity: 23-37 mins                    G Codes:       Reinaldo Berber, PT, DPT Acute Rehab Services Pager: [phone]     Reinaldo Berber 04/08/2017, 12:23 PM

## 2017-04-09 LAB — CBC
HCT: 34.5 % — ABNORMAL LOW (ref 39.0–52.0)
Hemoglobin: 11.6 g/dL — ABNORMAL LOW (ref 13.0–17.0)
MCH: 31.9 pg (ref 26.0–34.0)
MCHC: 33.6 g/dL (ref 30.0–36.0)
MCV: 94.8 fL (ref 78.0–100.0)
PLATELETS: 327 10*3/uL (ref 150–400)
RBC: 3.64 MIL/uL — AB (ref 4.22–5.81)
RDW: 12.9 % (ref 11.5–15.5)
WBC: 12 10*3/uL — AB (ref 4.0–10.5)

## 2017-04-09 NOTE — Plan of Care (Signed)
  Clinical Measurements: Will remain free from infection 04/09/2017 1315 - Progressing by Williams Che, RN   Activity: Risk for activity intolerance will decrease 04/09/2017 1315 - Progressing by Williams Che, RN   Elimination: Will not experience complications related to bowel motility 04/09/2017 1315 - Progressing by Williams Che, RN   Pain Managment: General experience of comfort will improve 04/09/2017 1315 - Progressing by Williams Che, RN   Skin Integrity: Risk for impaired skin integrity will decrease 04/09/2017 1315 - Progressing by Williams Che, RN

## 2017-04-09 NOTE — Progress Notes (Signed)
Physical Therapy Treatment Patient Details Name: Richard Parrish MRN: [id number] DOB: [birth year] Today's Date: 04/09/2017    History of Present Illness Pt is a 67 y/o male s/p L THA, posterior approach. PMH includes CAD s/p CABG, OSA on CPAP, HTN, PVD, pulmonary fibrosis, and R THA.     PT Comments    Patient continues to have the most difficulty with bed mobility however did require less assist going toward R side this session. Pt required min/mod A for bed mobility and supervision/min guard for transfers and gait. Pt demonstrated improved gait mechanics. Continue to progress as tolerated.    Follow Up Recommendations  DC plan and follow up therapy as arranged by surgeon;Supervision for mobility/OOB     Equipment Recommendations  3in1 (PT)    Recommendations for Other Services       Precautions / Restrictions Precautions Precautions: Posterior Hip;Fall Restrictions Weight Bearing Restrictions: Yes LLE Weight Bearing: Weight bearing as tolerated    Mobility  Bed Mobility Overal bed mobility: Needs Assistance Bed Mobility: Supine to Sit     Supine to sit: Min assist     General bed mobility comments: cues for sequencing and technique; pt used leg lifter to bring L LE to EOB; assist to elevate trunk into sitting; pt with less difficulty going toward R side of bed  Transfers Overall transfer level: Needs assistance Equipment used: Rolling walker (2 wheeled) Transfers: Sit to/from Stand Sit to Stand: Min guard         General transfer comment: min guard for safety; cues for safe hand placement and technique  Ambulation/Gait Ambulation/Gait assistance: Supervision Ambulation Distance (Feet): 12 Feet Assistive device: Rolling walker (2 wheeled) Gait Pattern/deviations: Decreased weight shift to left;Step-through pattern;Antalgic Gait velocity: decreased   General Gait Details: cues for posture and increased L step length and Hip flexion; pt demonstrated improved L  foot clearance and step through pattern   Stairs         General stair comments: cues for sequencing and technique; min guard for safety  Wheelchair Mobility    Modified Rankin (Stroke Patients Only)       Balance Overall balance assessment: Needs assistance Sitting-balance support: No upper extremity supported;Feet supported Sitting balance-Leahy Scale: Good     Standing balance support: During functional activity Standing balance-Leahy Scale: Poor Standing balance comment: Reliant on BUE support.                             Cognition Arousal/Alertness: Awake/alert Behavior During Therapy: WFL for tasks assessed/performed Overall Cognitive Status: Within Functional Limits for tasks assessed                                        Exercises Total Joint Exercises Quad Sets: AROM;Left;10 reps Heel Slides: AAROM;Left;10 reps Hip ABduction/ADduction: AAROM;Left;10 reps Long Arc Quad: AROM;Left;5 reps;Seated Marching in Standing: AROM;Left;10 reps;Standing    General Comments General comments (skin integrity, edema, etc.): wife present throughout session      Pertinent Vitals/Pain Faces Pain Scale: Hurts little more Pain Location: L hip and groin Pain Descriptors / Indicators: Grimacing;Guarding;Sore Pain Intervention(s): Limited activity within patient's tolerance;Monitored during session;Premedicated before session;Repositioned    Home Living                      Prior Function  PT Goals (current goals can now be found in the care plan section) Acute Rehab PT Goals Patient Stated Goal: to decrease pain  PT Goal Formulation: With patient Time For Goal Achievement: 04/21/17 Potential to Achieve Goals: Good Progress towards PT goals: Progressing toward goals    Frequency    7X/week      PT Plan Current plan remains appropriate    Co-evaluation              AM-PAC PT "6 Clicks" Daily Activity   Outcome Measure  Difficulty turning over in bed (including adjusting bedclothes, sheets and blankets)?: A Little Difficulty moving from lying on back to sitting on the side of the bed? : Unable Difficulty sitting down on and standing up from a chair with arms (e.g., wheelchair, bedside commode, etc,.)?: Unable Help needed moving to and from a bed to chair (including a wheelchair)?: A Little Help needed walking in hospital room?: A Little Help needed climbing 3-5 steps with a railing? : A Little 6 Click Score: 14    End of Session Equipment Utilized During Treatment: Gait belt Activity Tolerance: Patient tolerated treatment well Patient left: in chair;with call bell/phone within reach;with family/visitor present Nurse Communication: Mobility status PT Visit Diagnosis: Unsteadiness on feet (R26.81);Other abnormalities of gait and mobility (R26.89);Pain Pain - Right/Left: Left Pain - part of body: Hip     Time: 4734-0370 PT Time Calculation (min) (ACUTE ONLY): 37 min  Charges:  $Therapeutic Exercise: 8-22 mins $Therapeutic Activity: 8-22 mins                    G Codes:       Earney Navy, PTA Pager: [phone]     Darliss Cheney 04/09/2017, 4:17 PM

## 2017-04-09 NOTE — Plan of Care (Signed)
Patient observed to be having difficulty mobilizing in bed on own.  Stated he likes having the knee immobilizer in place for added security, dressing intact, no drainage, minimal swelling at L hip observed, ice to Sx are remains in place.

## 2017-04-09 NOTE — Progress Notes (Signed)
Physical Therapy Treatment Patient Details Name: Richard Parrish MRN: [id number] DOB: 09/02/1950 Today's Date: 04/09/2017    History of Present Illness Pt is a 67 y/o male s/p L THA, posterior approach. PMH includes CAD s/p CABG, OSA on CPAP, HTN, PVD, pulmonary fibrosis, and R THA.     PT Comments    Patient is progressing toward mobility goals. Pt able to ambulate 156ft X2 and safely ascend/descend 2 steps simulating home entrance. Wife present during session. Pt encouraged to increase mobility throughout the day with nursing staff. Continue to progress as tolerated.    Follow Up Recommendations  DC plan and follow up therapy as arranged by surgeon;Supervision for mobility/OOB     Equipment Recommendations  3in1 (PT)    Recommendations for Other Services       Precautions / Restrictions Precautions Precautions: Posterior Hip;Fall Precaution Comments: pt. 2/3, cues for "no bending past 90 degrees Restrictions Weight Bearing Restrictions: Yes LLE Weight Bearing: Weight bearing as tolerated    Mobility  Bed Mobility Overal bed mobility: Needs Assistance Bed Mobility: Supine to Sit     Supine to sit: Mod assist     General bed mobility comments: cues for sequencing and technique; assist to elevate trunk into sitting  Transfers Overall transfer level: Needs assistance Equipment used: Rolling walker (2 wheeled) Transfers: Sit to/from Stand Sit to Stand: Min guard         General transfer comment: min guard for safety; cues for safe hand placement and technique  Ambulation/Gait Ambulation/Gait assistance: Min guard;Supervision Ambulation Distance (Feet): (149ft X2) Assistive device: Rolling walker (2 wheeled) Gait Pattern/deviations: Step-to pattern;Decreased step length - left;Decreased weight shift to left;Antalgic;Step-through pattern;Decreased stance time - left Gait velocity: decreased   General Gait Details: cues for posture, sequencing, technique for  offloading L LE and for increased L hip/knee flexion during swing for improved floor clearance; improved gait mechanics demonstrated with increased distance    Stairs Stairs: Yes   Stair Management: One rail Left;Step to pattern;Sideways Number of Stairs: 2 General stair comments: cues for sequencing and technique; min guard for safety  Wheelchair Mobility    Modified Rankin (Stroke Patients Only)       Balance Overall balance assessment: Needs assistance Sitting-balance support: No upper extremity supported;Feet supported Sitting balance-Leahy Scale: Good     Standing balance support: During functional activity Standing balance-Leahy Scale: Poor Standing balance comment: Reliant on BUE support.                             Cognition Arousal/Alertness: Awake/alert Behavior During Therapy: WFL for tasks assessed/performed Overall Cognitive Status: Within Functional Limits for tasks assessed                                 General Comments: gets off topic often      Exercises      General Comments General comments (skin integrity, edema, etc.): wife present throughout session      Pertinent Vitals/Pain Pain Assessment: Faces Pain Score: 7  Faces Pain Scale: Hurts little more(6 with supine to sit) Pain Location: L hip and goin Pain Descriptors / Indicators: Grimacing;Guarding;Sore Pain Intervention(s): Limited activity within patient's tolerance;Monitored during session;Premedicated before session;Repositioned;Ice applied    Home Living                      Prior Function  PT Goals (current goals can now be found in the care plan section) Acute Rehab PT Goals Patient Stated Goal: to decrease pain  PT Goal Formulation: With patient Time For Goal Achievement: 04/21/17 Potential to Achieve Goals: Good Progress towards PT goals: Progressing toward goals    Frequency    7X/week      PT Plan Current plan  remains appropriate    Co-evaluation              AM-PAC PT "6 Clicks" Daily Activity  Outcome Measure  Difficulty turning over in bed (including adjusting bedclothes, sheets and blankets)?: A Little Difficulty moving from lying on back to sitting on the side of the bed? : Unable Difficulty sitting down on and standing up from a chair with arms (e.g., wheelchair, bedside commode, etc,.)?: Unable Help needed moving to and from a bed to chair (including a wheelchair)?: A Little Help needed walking in hospital room?: A Little Help needed climbing 3-5 steps with a railing? : A Little 6 Click Score: 14    End of Session Equipment Utilized During Treatment: Gait belt Activity Tolerance: Patient tolerated treatment well Patient left: in chair;with call bell/phone within reach;with family/visitor present Nurse Communication: Mobility status PT Visit Diagnosis: Unsteadiness on feet (R26.81);Other abnormalities of gait and mobility (R26.89);Pain Pain - Right/Left: Left Pain - part of body: Hip     Time: 5396-7289 PT Time Calculation (min) (ACUTE ONLY): 54 min  Charges:  $Gait Training: 23-37 mins $Therapeutic Activity: 23-37 mins                    G Codes:       Earney Navy, PTA Pager: [phone]     Darliss Cheney 04/09/2017, 11:34 AM

## 2017-04-09 NOTE — Progress Notes (Addendum)
Occupational Therapy Treatment Patient Details Name: Richard Parrish MRN: [id number] DOB: Mar 01, 1950 Today's Date: 04/09/2017    History of present illness Pt is a 66 y/o male s/p L THA, posterior approach. PMH includes CAD s/p CABG, OSA on CPAP, HTN, PVD, pulmonary fibrosis, and R THA.    OT comments  Pt. Remains with constant concerns and discussion of his LLE and groin pain.  Agreeable to therapy though.  Able to complete bed mobility and shower stall transfer Min A.  Encouraged oob and pt. Able to sit in recliner at end of session.   Will continue to follow.   Follow Up Recommendations  No OT follow up    Equipment Recommendations  3 in 1 bedside commode    Recommendations for Other Services      Precautions / Restrictions Precautions Precautions: Posterior Hip;Fall Precaution Comments: pt. 2/3, cues for "no bending past 90 degrees Restrictions LLE Weight Bearing: Weight bearing as tolerated -md present during session and confirms KI is not necessary or required. It was noted pt. Preferred it for stability.  Pt. Now states it "limits" him and prefers not to wear it.       Mobility Bed Mobility Overal bed mobility: Needs Assistance Bed Mobility: Supine to Sit     Supine to sit: Mod assist     General bed mobility comments: pt limited by pain, mod assist to achieve EOB, cues to maintain precautions while bringing LLE towards eob  Transfers Overall transfer level: Needs assistance Equipment used: Rolling walker (2 wheeled) Transfers: Sit to/from Stand Sit to Stand: Min assist         General transfer comment: assist to rise and steady, cues for technique, assist to control descent    Balance                                           ADL either performed or assessed with clinical judgement   ADL Overall ADL's : Needs assistance/impaired                                 Tub/ Shower Transfer: Walk-in shower;Ambulation;Rolling  walker;Minimal assistance;Cueing for sequencing;Anterior/posterior Tub/Shower Transfer Details (indicate cue type and reason): pt. able to return demo over shower stall ledge with focus on stepping in backwards with R le first, then L le and reverse for exiting the shower Functional mobility during ADLs: Min guard;Rolling walker General ADL Comments: remains limited by pain.  requries a lot of encouragement and redirection as he wants to cont. to describe the pain.       Vision       Perception     Praxis      Cognition Arousal/Alertness: Awake/alert Behavior During Therapy: WFL for tasks assessed/performed Overall Cognitive Status: Within Functional Limits for tasks assessed                                 General Comments: some mild confusion at times, may be med related        Exercises     Shoulder Instructions       General Comments  has 2 labradors and a little poodle mix. Eager to get home and take care of his dogs.    Pertinent Vitals/ Pain  Pain Assessment: 0-10 Pain Score: 7  Pain Location: L hip and goin Pain Descriptors / Indicators: Aching Pain Intervention(s): Limited activity within patient's tolerance;Monitored during session;Premedicated before session;Repositioned;Ice applied  Home Living                                          Prior Functioning/Environment              Frequency  Min 2X/week        Progress Toward Goals  OT Goals(current goals can now be found in the care plan section)  Progress towards OT goals: Progressing toward goals     Plan      Co-evaluation                 AM-PAC PT "6 Clicks" Daily Activity     Outcome Measure   Help from another person eating meals?: None Help from another person taking care of personal grooming?: A Little Help from another person toileting, which includes using toliet, bedpan, or urinal?: A Lot Help from another person bathing (including  washing, rinsing, drying)?: A Lot Help from another person to put on and taking off regular upper body clothing?: None Help from another person to put on and taking off regular lower body clothing?: A Lot 6 Click Score: 17    End of Session Equipment Utilized During Treatment: Gait belt;Rolling walker  OT Visit Diagnosis: Unsteadiness on feet (R26.81);Other abnormalities of gait and mobility (R26.89);Pain;Muscle weakness (generalized) (M62.81)   Activity Tolerance Patient tolerated treatment well;Patient limited by pain   Patient Left in chair;with call bell/phone within reach;with family/visitor present;with nursing/sitter in room   Nurse Communication          Time: 0825-0902 OT Time Calculation (min): 37 min  Charges: OT General Charges $OT Visit: 1 Visit OT Treatments $Self Care/Home Management : 23-37 mins   Janice Coffin, COTA/L 04/09/2017, 9:13 AM

## 2017-04-09 NOTE — Progress Notes (Signed)
SPORTS MEDICINE AND JOINT REPLACEMENT  Lara Mulch, MD    Carlyon Shadow, PA-C [place], [place], [place]  [address]                             [phone]   PROGRESS NOTE  Subjective:  negative for Chest Pain  negative for Shortness of Breath  negative for Nausea/Vomiting   negative for Calf Pain  negative for Bowel Movement   Tolerating Diet: yes         Patient reports pain as 5 on 0-10 scale.    Objective: Vital signs in last 24 hours:    Patient Vitals for the past 24 hrs:  BP Temp Temp src Pulse Resp SpO2  04/09/17 0507 (!) 134/57 99.2 F (37.3 C) Oral (!) 56 16 94 %  04/08/17 2031 (!) 100/41 99 F (37.2 C) Oral (!) 52 16 94 %  04/08/17 1346 (!) 99/49 98.4 F (36.9 C) Oral 67 17 96 %    @flow {1959:LAST@   Intake/Output from previous day:   02/09 0701 - 02/10 0700 In: 3765 [P.O.:240; I.V.:3525] Out: 1250 [Urine:1250]   Intake/Output this shift:   No intake/output data recorded.   Intake/Output      02/09 0701 - 02/10 0700 02/10 0701 - 02/11 0700   P.O. 240    I.V. 3525    IV Piggyback     Total Intake 3765    Urine 1250    Blood     Total Output 1250    Net +2515            LABORATORY DATA: Recent Labs    04/08/17 0605  WBC 10.1  HGB 11.9*  HCT 34.6*  PLT 299   Recent Labs    04/08/17 0605  NA 138  K 3.8  CL 103  CO2 25  BUN 15  CREATININE 1.00  GLUCOSE 115*  CALCIUM 8.8*   Lab Results  Component Value Date   INR 1.00 03/24/2017    Examination:  General appearance: alert, cooperative and no distress Extremities: extremities normal, atraumatic, no cyanosis or edema  Wound Exam: clean, dry, intact   Drainage:  None: wound tissue dry  Motor Exam: Quadriceps and Hamstrings Intact  Sensory Exam: Superficial Peroneal, Deep Peroneal and Tibial normal   Assessment:    2 Days Post-Op  Procedure(s) (LRB): LEFT TOTAL HIP ARTHROPLASTY (Left)  ADDITIONAL DIAGNOSIS:  Active Problems:   Degenerative joint disease  of left hip     Plan: Physical Therapy as ordered Weight Bearing as Tolerated (WBAT)  DVT Prophylaxis:  eliquis  DISCHARGE PLAN: Home  DISCHARGE NEEDS: HHPT   Patient progressing but still limited in PT due to pain. Will continue to monitor through the weekend         Donia Ast 04/09/2017, 8:55 AM

## 2017-04-10 ENCOUNTER — Encounter (HOSPITAL_COMMUNITY): Payer: Self-pay | Admitting: Orthopedic Surgery

## 2017-04-10 LAB — CBC
HEMATOCRIT: 31.5 % — AB (ref 39.0–52.0)
HEMOGLOBIN: 10.7 g/dL — AB (ref 13.0–17.0)
MCH: 31.8 pg (ref 26.0–34.0)
MCHC: 34 g/dL (ref 30.0–36.0)
MCV: 93.5 fL (ref 78.0–100.0)
Platelets: 255 10*3/uL (ref 150–400)
RBC: 3.37 MIL/uL — AB (ref 4.22–5.81)
RDW: 12.7 % (ref 11.5–15.5)
WBC: 11 10*3/uL — ABNORMAL HIGH (ref 4.0–10.5)

## 2017-04-10 NOTE — Plan of Care (Signed)
  Clinical Measurements: Will remain free from infection 04/10/2017 1129 - Progressing by Williams Che, RN   Activity: Risk for activity intolerance will decrease 04/10/2017 1129 - Progressing by Williams Che, RN   Pain Managment: General experience of comfort will improve 04/10/2017 1129 - Progressing by Williams Che, RN

## 2017-04-10 NOTE — Progress Notes (Signed)
Removed IV, provided discharge education/instructions, all questions and concerns addressed, Pt not in distress, discharged home with belongings accompanied by wife.

## 2017-04-10 NOTE — Discharge Summary (Signed)
PATIENT ID: Richard Parrish        MRN:  [id number]          DOB/AGE: 10-10-1950 / 67 y.o.    DISCHARGE SUMMARY  ADMISSION DATE:    04/07/2017 DISCHARGE DATE:   04/10/2017   ADMISSION DIAGNOSIS: OA LEFT HIP    DISCHARGE DIAGNOSIS:  OA LEFT HIP    ADDITIONAL DIAGNOSIS: Active Problems:   Degenerative joint disease of left hip  Past Medical History:  Diagnosis Date  . Anxiety   . Arthritis    "lower back" (04/07/2017)  . BPH (benign prostatic hypertrophy)   . C. difficile diarrhea   . CAD (coronary artery disease)   . Chronic lower back pain   . Coronary artery disease 1989   cabg with dvg to rca   . Depression   . GERD (gastroesophageal reflux disease)   . Hyperlipidemia   . Hypertension   . Insomnia   . Leg pain   . Myocardial infarction ([place]) 2002  . OSA on CPAP    cpap setting of 60 per pt  . Osteoarthritis    "hips" (04/07/2017)  . PAD (peripheral artery disease) ([place])   . Peripheral vascular disease ([place])   . Pneumonia 1989   S/P CABG  . Pulmonary fibrosis ([place])    "one time" (04/07/2017)  . Spinal stenosis     PROCEDURE: Procedure(s): LEFT TOTAL HIP ARTHROPLASTY Left on 04/07/2017  CONSULTS: PT/OT    HISTORY:  See H&P in chart  HOSPITAL COURSE:  Richard Parrish is a 67 y.o. admitted on 04/07/2017 and found to have a diagnosis of OA LEFT HIP.  After appropriate laboratory studies were obtained  they were taken to the operating room on 04/07/2017 and underwent  Procedure(s): LEFT TOTAL HIP ARTHROPLASTY  Left.   They were given perioperative antibiotics:  Anti-infectives (From admission, onward)   Start     Dose/Rate Route Frequency Ordered Stop   04/07/17 1930  vancomycin (VANCOCIN) IVPB 1000 mg/200 mL premix     1,000 mg 200 mL/hr over 60 Minutes Intravenous Every 12 hours 04/07/17 1347 04/07/17 2140   04/07/17 0600  vancomycin (VANCOCIN) 1,500 mg in sodium chloride 0.9 % 500 mL IVPB     1,500 mg 250 mL/hr over 120 Minutes Intravenous To Short Stay  04/06/17 0838 04/07/17 0830    .  Tolerated the procedure well.   POD #1, allowed out of bed to a chair.  PT for ambulation and exercise program. IV saline locked.  O2 discontionued.  POD #2, continued PT and ambulation. Slow with bed mobility with PT.  The remainder of the hospital course was dedicated to ambulation and strengthening.   The patient was discharged on 3 Days Post-Op in  Stable condition.  Blood products given:none  DIAGNOSTIC STUDIES: Recent vital signs:  Patient Vitals for the past 24 hrs:  BP Temp Temp src Pulse Resp SpO2  04/10/17 0451 116/67 98.8 F (37.1 C) Oral 67 16 97 %  04/09/17 2302 - 99.5 F (37.5 C) Oral - - -  04/09/17 1936 (!) 121/51 100.2 F (37.9 C) Oral (!) 58 16 98 %  04/09/17 1500 (!) 99/46 98.6 F (37 C) Oral 76 16 96 %       Recent laboratory studies: Recent Labs    04/08/17 0605 04/09/17 0932 04/10/17 0621  WBC 10.1 12.0* 11.0*  HGB 11.9* 11.6* 10.7*  HCT 34.6* 34.5* 31.5*  PLT 299 327 255   Recent Labs  04/08/17 0605  NA 138  K 3.8  CL 103  CO2 25  BUN 15  CREATININE 1.00  GLUCOSE 115*  CALCIUM 8.8*   Lab Results  Component Value Date   INR 1.00 03/24/2017     Recent Radiographic Studies :  Dg Chest 2 View  Result Date: 03/24/2017 CLINICAL DATA:  Patient with total left hip arthroplasty. EXAM: CHEST  2 VIEW COMPARISON:  Chest radiograph 07/15/2013 FINDINGS: Stable cardiac and mediastinal contours status post median sternotomy. Unchanged left greater than right lower lung scarring. No superimposed pulmonary consolidation. Thoracic spine degenerative changes. IMPRESSION: No acute cardiopulmonary process. Electronically Signed   By: Lovey Newcomer M.D.   On: 03/24/2017 20:52   Dg Hip Port Unilat With Pelvis 1v Left  Result Date: 04/07/2017 CLINICAL DATA:  Left total hip replacement EXAM: DG HIP (WITH OR WITHOUT PELVIS) 1V PORT LEFT COMPARISON:  11/30/2016 FINDINGS: Changes of left hip replacement. No hardware bony  complicating feature. Remote changes of right hip replacement. IMPRESSION: Left hip replacement.  No complicating feature. Electronically Signed   By: Rolm Baptise M.D.   On: 04/07/2017 11:12    DISCHARGE INSTRUCTIONS:   DISCHARGE MEDICATIONS:   Allergies as of 04/10/2017      Reactions   Ambien [zolpidem Tartrate] Other (See Comments)   Bad dreams/Vivid Dreams      Medication List    STOP taking these medications   aspirin 81 MG tablet Replaced by:  aspirin EC 325 MG tablet   HYDROcodone-acetaminophen 7.5-325 MG tablet Commonly known as:  NORCO     TAKE these medications   acetaminophen 500 MG tablet Commonly known as:  TYLENOL Take 500 mg by mouth every 6 (six) hours as needed.   aspirin EC 325 MG tablet Take 1 tablet (325 mg total) by mouth daily. Take in place of your 81mg  aspirin for 2 weeks then may resume normal dose Replaces:  aspirin 81 MG tablet   atorvastatin 40 MG tablet Commonly known as:  LIPITOR TAKE 1 TABLET (40 MG TOTAL) BY MOUTH DAILY.   clopidogrel 75 MG tablet Commonly known as:  PLAVIX TAKE 1 TABLET BY MOUTH EVERY DAY   Fish Oil 1000 MG Caps Take 1,000 mg by mouth every other day.   GLUCOSAMINE-MSM PO Take 1 tablet by mouth every other day.   hyoscyamine 0.125 MG tablet Commonly known as:  LEVSIN, ANASPAZ Take 0.125 mg by mouth every 4 (four) hours as needed for cramping.   ibuprofen 200 MG tablet Commonly known as:  ADVIL,MOTRIN Take 400 mg by mouth 4 (four) times daily as needed (for pain.).   metoprolol succinate 50 MG 24 hr tablet Commonly known as:  TOPROL-XL Take 1 tablet (50 mg total) by mouth daily.   multivitamin tablet Take 1 tablet by mouth every other day.   nitroGLYCERIN 0.4 MG SL tablet Commonly known as:  NITROSTAT Place 0.4 mg under the tongue every 5 (five) minutes as needed for chest pain.   omeprazole 20 MG tablet Commonly known as:  PRILOSEC OTC Take 20 mg by mouth daily before breakfast.   oxyCODONE 5 MG  immediate release tablet Commonly known as:  Oxy IR/ROXICODONE 1-2 tabs po q4-6hrs prn pain   PARoxetine 40 MG tablet Commonly known as:  PAXIL Take 40 mg by mouth daily.   PROBIOTIC PO Take 1 capsule by mouth daily.   tamsulosin 0.4 MG Caps capsule Commonly known as:  FLOMAX Take 1 capsule by mouth every morning.   tiZANidine 2  MG tablet Commonly known as:  ZANAFLEX Take 2 mg by mouth every 6 (six) hours as needed. For muscle spasms.   valsartan 320 MG tablet Commonly known as:  DIOVAN TAKE 1 TABLET BY MOUTH EVERY DAY            Durable Medical Equipment  (From admission, onward)        Start     Ordered   04/07/17 1348  DME Walker rolling  Once    Question:  Patient needs a walker to treat with the following condition  Answer:  Degenerative joint disease of left hip   04/07/17 1347   04/07/17 1348  DME 3 n 1  Once     04/07/17 1347      FOLLOW UP VISIT:   Follow-up Information    Earlie Server, MD. Schedule an appointment as soon as possible for a visit in 2 week(s).   Specialty:  Orthopedic Surgery Contact information: [place] [address] [address] [phone]        Home, [facility] Follow up.   Specialty:  [place] Why:  [facility] will call to arrange initial visit Contact information: [place] [address] [phone]           DISPOSITION:   Home  CONDITION:  Stable   Chriss Czar, PA-C  04/10/2017 8:39 AM

## 2017-04-10 NOTE — Progress Notes (Signed)
Physical Therapy Treatment Patient Details Name: Richard Parrish MRN: [id number] DOB: 04-28-1950 Today's Date: 04/10/2017    History of Present Illness Pt is a 67 y/o male s/p L THA, posterior approach. PMH includes CAD s/p CABG, OSA on CPAP, HTN, PVD, pulmonary fibrosis, and R THA.     PT Comments    Patient continues to make progress toward mobility goals. Overall supervision/min guard for all mobility. Current plan remains appropriate.    Follow Up Recommendations  DC plan and follow up therapy as arranged by surgeon;Supervision for mobility/OOB     Equipment Recommendations  3in1 (PT)    Recommendations for Other Services       Precautions / Restrictions Precautions Precautions: Posterior Hip;Fall Precaution Comments: precautinos reviewed with pt Restrictions Weight Bearing Restrictions: Yes LLE Weight Bearing: Weight bearing as tolerated    Mobility  Bed Mobility Overal bed mobility: Needs Assistance Bed Mobility: Supine to Sit     Supine to sit: Min guard     General bed mobility comments: cues for sequencing and technique; min guard for safety; increased time and effort needed  Transfers Overall transfer level: Needs assistance Equipment used: Rolling walker (2 wheeled) Transfers: Sit to/from Stand Sit to Stand: Min guard         General transfer comment: min guard for safety; cues for safe hand placement   Ambulation/Gait Ambulation/Gait assistance: Supervision Ambulation Distance (Feet): 150 Feet Assistive device: Rolling walker (2 wheeled) Gait Pattern/deviations: Decreased weight shift to left;Step-through pattern;Antalgic Gait velocity: decreased   General Gait Details: cues for posture, sequencing, and increased L hip/knee flexion during swing phase   Stairs     Stair Management: One rail Left;Step to pattern;Sideways Number of Stairs: 2 General stair comments: carry over demonstrated; min guard for safety  Wheelchair Mobility     Modified Rankin (Stroke Patients Only)       Balance Overall balance assessment: Needs assistance Sitting-balance support: No upper extremity supported;Feet supported Sitting balance-Leahy Scale: Good     Standing balance support: During functional activity Standing balance-Leahy Scale: Poor Standing balance comment: Reliant on BUE support.                             Cognition Arousal/Alertness: Awake/alert Behavior During Therapy: WFL for tasks assessed/performed Overall Cognitive Status: Within Functional Limits for tasks assessed                                        Exercises      General Comments        Pertinent Vitals/Pain Pain Assessment: Faces Faces Pain Scale: Hurts little more Pain Location: L hip and groin Pain Descriptors / Indicators: Guarding;Sore Pain Intervention(s): Limited activity within patient's tolerance;Monitored during session;Premedicated before session;Repositioned    Home Living                      Prior Function            PT Goals (current goals can now be found in the care plan section) Acute Rehab PT Goals Patient Stated Goal: to decrease pain  PT Goal Formulation: With patient Time For Goal Achievement: 04/21/17 Potential to Achieve Goals: Good Progress towards PT goals: Progressing toward goals    Frequency    7X/week      PT Plan Current plan remains appropriate  Co-evaluation              AM-PAC PT "6 Clicks" Daily Activity  Outcome Measure  Difficulty turning over in bed (including adjusting bedclothes, sheets and blankets)?: A Little Difficulty moving from lying on back to sitting on the side of the bed? : Unable Difficulty sitting down on and standing up from a chair with arms (e.g., wheelchair, bedside commode, etc,.)?: Unable Help needed moving to and from a bed to chair (including a wheelchair)?: A Little Help needed walking in hospital room?: A  Little Help needed climbing 3-5 steps with a railing? : A Little 6 Click Score: 14    End of Session Equipment Utilized During Treatment: Gait belt Activity Tolerance: Patient tolerated treatment well Patient left: with call bell/phone within reach;with family/visitor present;in bed(sitting EOB with wife) Nurse Communication: Mobility status PT Visit Diagnosis: Unsteadiness on feet (R26.81);Other abnormalities of gait and mobility (R26.89);Pain Pain - Right/Left: Left Pain - part of body: Hip     Time: 9728-2060 PT Time Calculation (min) (ACUTE ONLY): 37 min  Charges:  $Gait Training: 8-22 mins $Therapeutic Activity: 8-22 mins                    G Codes:       Earney Navy, PTA Pager: [phone]     Darliss Cheney 04/10/2017, 9:21 AM

## 2017-04-10 NOTE — Progress Notes (Signed)
Occupational Therapy Treatment Patient Details Name: Richard Parrish MRN: [id number] DOB: Jan 06, 1951 Today's Date: 04/10/2017    History of present illness Pt is a 67 y/o male s/p L THA, posterior approach. PMH includes CAD s/p CABG, OSA on CPAP, HTN, PVD, pulmonary fibrosis, and R THA.    OT comments  Pt demonstrates dressing as precursor for home and needs cues for hip flexion restriction in this session. Pt states "I keep forgetting one of them" pt with adl task to help reinforce precaution. Wife "Richard Parrish" to be with patient until Wednesday.    Follow Up Recommendations  No OT follow up    Equipment Recommendations  3 in 1 bedside commode(present in room at this time)    Recommendations for Other Services      Precautions / Restrictions Precautions Precautions: Posterior Hip;Fall Precaution Comments: poor recall of hip flexion (90 degree precautions) Restrictions Weight Bearing Restrictions: Yes LLE Weight Bearing: Weight bearing as tolerated       Mobility Bed Mobility Overal bed mobility: Needs Assistance Bed Mobility: Supine to Sit;Sit to Supine     Supine to sit: Min guard Sit to supine: Min guard   General bed mobility comments: leg lift to get back into the bed. pt is able to bridge buttock. pt requires increase time for all transfers  Transfers Overall transfer level: Needs assistance Equipment used: Rolling walker (2 wheeled) Transfers: Sit to/from Stand Sit to Stand: Min assist         General transfer comment: required (A) to power up into standing    Balance Overall balance assessment: Needs assistance Sitting-balance support: No upper extremity supported;Feet supported Sitting balance-Leahy Scale: Good     Standing balance support: During functional activity Standing balance-Leahy Scale: Poor Standing balance comment: Reliant on BUE support.                            ADL either performed or assessed with clinical judgement   ADL  Overall ADL's : Needs assistance/impaired Eating/Feeding: Independent   Grooming: Independent           Upper Body Dressing : Independent   Lower Body Dressing: Supervision/safety;Sit to/from stand Lower Body Dressing Details (indicate cue type and reason): pt reports having all the AE required for home. pt needed cues multiple times during session to dress operative leg first. Pt needed cues for 90 degree restriction. pt does demonstrate correct knowledge of the AE. Wife present and reports 'he is just so stubborn. got to do it his way"               General ADL Comments: pt encouraged by wife to participate and agreeable. After session complete patient shaking therapist hand and states "thank you so much for coming to do this. this was really helpful"     Vision       Perception     Praxis      Cognition Arousal/Alertness: Awake/alert Behavior During Therapy: WFL for tasks assessed/performed Overall Cognitive Status: Within Functional Limits for tasks assessed                                 General Comments: w        Exercises     Shoulder Instructions       General Comments dressing with bandage at this time    Pertinent Vitals/ Pain  Pain Assessment: Faces Faces Pain Scale: Hurts little more Pain Location: L calf pain Pain Descriptors / Indicators: Sore;Operative site guarding Pain Intervention(s): Monitored during session;Premedicated before session;Repositioned  Home Living                                          Prior Functioning/Environment              Frequency  Min 2X/week        Progress Toward Goals  OT Goals(current goals can now be found in the care plan section)  Progress towards OT goals: Progressing toward goals  Acute Rehab OT Goals Patient Stated Goal: to decrease pain  OT Goal Formulation: With patient Time For Goal Achievement: 04/22/17 Potential to Achieve Goals: Good ADL  Goals Pt Will Perform Grooming: with supervision;standing Pt Will Perform Lower Body Dressing: with supervision;with adaptive equipment;sit to/from stand Pt Will Transfer to Toilet: with supervision;ambulating;bedside commode Pt Will Perform Toileting - Clothing Manipulation and hygiene: with supervision;sit to/from stand Pt Will Perform Tub/Shower Transfer: with supervision;ambulating;3 in 1;rolling walker Additional ADL Goal #1: Pt will generalize posterior hip precautions in ADL independently.  Plan Discharge plan remains appropriate    Co-evaluation                 AM-PAC PT "6 Clicks" Daily Activity     Outcome Measure   Help from another person eating meals?: None Help from another person taking care of personal grooming?: A Little Help from another person toileting, which includes using toliet, bedpan, or urinal?: A Little Help from another person bathing (including washing, rinsing, drying)?: A Little Help from another person to put on and taking off regular upper body clothing?: None Help from another person to put on and taking off regular lower body clothing?: A Little 6 Click Score: 20    End of Session Equipment Utilized During Treatment: Gait belt;Rolling walker  OT Visit Diagnosis: Unsteadiness on feet (R26.81);Other abnormalities of gait and mobility (R26.89);Pain;Muscle weakness (generalized) (M62.81)   Activity Tolerance Patient tolerated treatment well;Patient limited by pain   Patient Left in chair;with call bell/phone within reach;with family/visitor present;with nursing/sitter in room   Nurse Communication Patient requests pain meds        Time: 3893-7342 OT Time Calculation (min): 40 min  Charges: OT General Charges $OT Visit: 1 Visit OT Treatments $Self Care/Home Management : 38-52 mins   Jeri Modena   OTR/L Pager: [phone] Office: [phone] .    Parke Poisson B 04/10/2017, 11:56 AM

## 2017-04-11 DIAGNOSIS — J8410 Pulmonary fibrosis, unspecified: Secondary | ICD-10-CM | POA: Diagnosis not present

## 2017-04-11 DIAGNOSIS — Z471 Aftercare following joint replacement surgery: Secondary | ICD-10-CM | POA: Diagnosis not present

## 2017-04-11 DIAGNOSIS — I70219 Atherosclerosis of native arteries of extremities with intermittent claudication, unspecified extremity: Secondary | ICD-10-CM | POA: Diagnosis not present

## 2017-04-11 DIAGNOSIS — I2510 Atherosclerotic heart disease of native coronary artery without angina pectoris: Secondary | ICD-10-CM | POA: Diagnosis not present

## 2017-04-11 DIAGNOSIS — M4800 Spinal stenosis, site unspecified: Secondary | ICD-10-CM | POA: Diagnosis not present

## 2017-04-11 DIAGNOSIS — I10 Essential (primary) hypertension: Secondary | ICD-10-CM | POA: Diagnosis not present

## 2017-04-13 DIAGNOSIS — J8410 Pulmonary fibrosis, unspecified: Secondary | ICD-10-CM | POA: Diagnosis not present

## 2017-04-13 DIAGNOSIS — Z471 Aftercare following joint replacement surgery: Secondary | ICD-10-CM | POA: Diagnosis not present

## 2017-04-13 DIAGNOSIS — I2510 Atherosclerotic heart disease of native coronary artery without angina pectoris: Secondary | ICD-10-CM | POA: Diagnosis not present

## 2017-04-13 DIAGNOSIS — I10 Essential (primary) hypertension: Secondary | ICD-10-CM | POA: Diagnosis not present

## 2017-04-13 DIAGNOSIS — I70219 Atherosclerosis of native arteries of extremities with intermittent claudication, unspecified extremity: Secondary | ICD-10-CM | POA: Diagnosis not present

## 2017-04-13 DIAGNOSIS — M4800 Spinal stenosis, site unspecified: Secondary | ICD-10-CM | POA: Diagnosis not present

## 2017-04-15 DIAGNOSIS — Z471 Aftercare following joint replacement surgery: Secondary | ICD-10-CM | POA: Diagnosis not present

## 2017-04-15 DIAGNOSIS — M4800 Spinal stenosis, site unspecified: Secondary | ICD-10-CM | POA: Diagnosis not present

## 2017-04-15 DIAGNOSIS — I70219 Atherosclerosis of native arteries of extremities with intermittent claudication, unspecified extremity: Secondary | ICD-10-CM | POA: Diagnosis not present

## 2017-04-15 DIAGNOSIS — I10 Essential (primary) hypertension: Secondary | ICD-10-CM | POA: Diagnosis not present

## 2017-04-15 DIAGNOSIS — J8410 Pulmonary fibrosis, unspecified: Secondary | ICD-10-CM | POA: Diagnosis not present

## 2017-04-15 DIAGNOSIS — I2510 Atherosclerotic heart disease of native coronary artery without angina pectoris: Secondary | ICD-10-CM | POA: Diagnosis not present

## 2017-04-18 DIAGNOSIS — I70219 Atherosclerosis of native arteries of extremities with intermittent claudication, unspecified extremity: Secondary | ICD-10-CM | POA: Diagnosis not present

## 2017-04-18 DIAGNOSIS — I10 Essential (primary) hypertension: Secondary | ICD-10-CM | POA: Diagnosis not present

## 2017-04-18 DIAGNOSIS — J8410 Pulmonary fibrosis, unspecified: Secondary | ICD-10-CM | POA: Diagnosis not present

## 2017-04-18 DIAGNOSIS — Z471 Aftercare following joint replacement surgery: Secondary | ICD-10-CM | POA: Diagnosis not present

## 2017-04-18 DIAGNOSIS — M4800 Spinal stenosis, site unspecified: Secondary | ICD-10-CM | POA: Diagnosis not present

## 2017-04-18 DIAGNOSIS — I2510 Atherosclerotic heart disease of native coronary artery without angina pectoris: Secondary | ICD-10-CM | POA: Diagnosis not present

## 2017-04-19 DIAGNOSIS — Z471 Aftercare following joint replacement surgery: Secondary | ICD-10-CM | POA: Diagnosis not present

## 2017-04-19 DIAGNOSIS — I10 Essential (primary) hypertension: Secondary | ICD-10-CM | POA: Diagnosis not present

## 2017-04-19 DIAGNOSIS — M4800 Spinal stenosis, site unspecified: Secondary | ICD-10-CM | POA: Diagnosis not present

## 2017-04-19 DIAGNOSIS — I2510 Atherosclerotic heart disease of native coronary artery without angina pectoris: Secondary | ICD-10-CM | POA: Diagnosis not present

## 2017-04-19 DIAGNOSIS — I70219 Atherosclerosis of native arteries of extremities with intermittent claudication, unspecified extremity: Secondary | ICD-10-CM | POA: Diagnosis not present

## 2017-04-19 DIAGNOSIS — J8410 Pulmonary fibrosis, unspecified: Secondary | ICD-10-CM | POA: Diagnosis not present

## 2017-04-20 DIAGNOSIS — M1611 Unilateral primary osteoarthritis, right hip: Secondary | ICD-10-CM | POA: Diagnosis not present

## 2017-04-28 DIAGNOSIS — M545 Low back pain: Secondary | ICD-10-CM | POA: Diagnosis not present

## 2017-04-28 DIAGNOSIS — Z79891 Long term (current) use of opiate analgesic: Secondary | ICD-10-CM | POA: Diagnosis not present

## 2017-04-28 DIAGNOSIS — Z79899 Other long term (current) drug therapy: Secondary | ICD-10-CM | POA: Diagnosis not present

## 2017-04-28 DIAGNOSIS — G894 Chronic pain syndrome: Secondary | ICD-10-CM | POA: Diagnosis not present

## 2017-05-02 ENCOUNTER — Telehealth: Payer: Self-pay | Admitting: *Deleted

## 2017-05-02 DIAGNOSIS — R6881 Early satiety: Secondary | ICD-10-CM | POA: Diagnosis not present

## 2017-05-02 DIAGNOSIS — R195 Other fecal abnormalities: Secondary | ICD-10-CM | POA: Diagnosis not present

## 2017-05-02 DIAGNOSIS — K529 Noninfective gastroenteritis and colitis, unspecified: Secondary | ICD-10-CM | POA: Diagnosis not present

## 2017-05-02 DIAGNOSIS — Z8679 Personal history of other diseases of the circulatory system: Secondary | ICD-10-CM | POA: Diagnosis not present

## 2017-05-02 DIAGNOSIS — Z800 Family history of malignant neoplasm of digestive organs: Secondary | ICD-10-CM | POA: Diagnosis not present

## 2017-05-02 DIAGNOSIS — K219 Gastro-esophageal reflux disease without esophagitis: Secondary | ICD-10-CM | POA: Diagnosis not present

## 2017-05-02 NOTE — Telephone Encounter (Signed)
   [facility] Pre-operative Risk Assessment    Request for surgical clearance:  1. What type of surgery is being performed?  Colonoscopy   2. When is this surgery scheduled?  Not scheduled yet   3. What type of clearance is required (medical clearance vs. Pharmacy clearance to hold med vs. Both)?   Clearance to hold med  4. Are there any medications that need to be held prior to surgery and how long? Plavix for 5 days prior   5. Practice name and name of physician performing surgery?  [facility] Dr. Alessandra Bevels   6. What is your office phone and fax number?  Office: [phone] Fax: [phone]  7. Anesthesia type (None, local, MAC, general) ?    Richard Parrish L 05/02/2017, 1:58 PM  _________________________________________________________________   (provider comments below)

## 2017-05-04 NOTE — Telephone Encounter (Signed)
   Primary Cardiologist: Sinclair Grooms, MD  Chart reviewed as part of pre-operative protocol coverage. Patient was contacted 05/04/2017 in reference to pre-operative risk assessment for pending surgery as outlined below.  Richard Parrish was last seen on 07/21/2016 by Dr. Tamala Julian.  Since that day, Richard Parrish has done well without any chest pain or SOB.  Therefore, based on ACC/AHA guidelines, the patient would be at acceptable risk for the planned procedure without further cardiovascular testing.   I will route this recommendation to the requesting party via Epic fax function and remove from pre-op pool.  Please call with questions.  Per Dr. Thompson Caul last note, "Has an upcoming colonoscopy. He is now far enough out from his last stent procedure that Plavix can be held without significant risk." He recently underwent hip surgery without any issue. He can hold plavix for 5 days prior to colonoscopy and restart as soon as possible after the procedure.   [place], [place] 05/04/2017, 2:53 PM

## 2017-05-09 DIAGNOSIS — M791 Myalgia, unspecified site: Secondary | ICD-10-CM | POA: Diagnosis not present

## 2017-05-09 DIAGNOSIS — M48062 Spinal stenosis, lumbar region with neurogenic claudication: Secondary | ICD-10-CM | POA: Diagnosis not present

## 2017-05-09 DIAGNOSIS — M545 Low back pain: Secondary | ICD-10-CM | POA: Diagnosis not present

## 2017-05-09 DIAGNOSIS — G894 Chronic pain syndrome: Secondary | ICD-10-CM | POA: Diagnosis not present

## 2017-05-10 DIAGNOSIS — Z96642 Presence of left artificial hip joint: Secondary | ICD-10-CM | POA: Diagnosis not present

## 2017-05-10 DIAGNOSIS — R262 Difficulty in walking, not elsewhere classified: Secondary | ICD-10-CM | POA: Diagnosis not present

## 2017-05-10 DIAGNOSIS — R531 Weakness: Secondary | ICD-10-CM | POA: Diagnosis not present

## 2017-05-15 DIAGNOSIS — R262 Difficulty in walking, not elsewhere classified: Secondary | ICD-10-CM | POA: Diagnosis not present

## 2017-05-15 DIAGNOSIS — R531 Weakness: Secondary | ICD-10-CM | POA: Diagnosis not present

## 2017-05-15 DIAGNOSIS — Z96642 Presence of left artificial hip joint: Secondary | ICD-10-CM | POA: Diagnosis not present

## 2017-05-17 DIAGNOSIS — R262 Difficulty in walking, not elsewhere classified: Secondary | ICD-10-CM | POA: Diagnosis not present

## 2017-05-17 DIAGNOSIS — R531 Weakness: Secondary | ICD-10-CM | POA: Diagnosis not present

## 2017-05-17 DIAGNOSIS — Z96642 Presence of left artificial hip joint: Secondary | ICD-10-CM | POA: Diagnosis not present

## 2017-05-18 DIAGNOSIS — K2980 Duodenitis without bleeding: Secondary | ICD-10-CM | POA: Diagnosis not present

## 2017-05-18 DIAGNOSIS — K5730 Diverticulosis of large intestine without perforation or abscess without bleeding: Secondary | ICD-10-CM | POA: Diagnosis not present

## 2017-05-18 DIAGNOSIS — K52832 Lymphocytic colitis: Secondary | ICD-10-CM | POA: Diagnosis not present

## 2017-05-18 DIAGNOSIS — K449 Diaphragmatic hernia without obstruction or gangrene: Secondary | ICD-10-CM | POA: Diagnosis not present

## 2017-05-18 DIAGNOSIS — K2990 Gastroduodenitis, unspecified, without bleeding: Secondary | ICD-10-CM | POA: Diagnosis not present

## 2017-05-18 DIAGNOSIS — R195 Other fecal abnormalities: Secondary | ICD-10-CM | POA: Diagnosis not present

## 2017-05-22 DIAGNOSIS — R531 Weakness: Secondary | ICD-10-CM | POA: Diagnosis not present

## 2017-05-22 DIAGNOSIS — Z96642 Presence of left artificial hip joint: Secondary | ICD-10-CM | POA: Diagnosis not present

## 2017-05-22 DIAGNOSIS — R262 Difficulty in walking, not elsewhere classified: Secondary | ICD-10-CM | POA: Diagnosis not present

## 2017-05-23 DIAGNOSIS — M1611 Unilateral primary osteoarthritis, right hip: Secondary | ICD-10-CM | POA: Diagnosis not present

## 2017-05-25 DIAGNOSIS — K2990 Gastroduodenitis, unspecified, without bleeding: Secondary | ICD-10-CM | POA: Diagnosis not present

## 2017-05-25 DIAGNOSIS — Z96642 Presence of left artificial hip joint: Secondary | ICD-10-CM | POA: Diagnosis not present

## 2017-05-25 DIAGNOSIS — K52832 Lymphocytic colitis: Secondary | ICD-10-CM | POA: Diagnosis not present

## 2017-05-25 DIAGNOSIS — R531 Weakness: Secondary | ICD-10-CM | POA: Diagnosis not present

## 2017-05-25 DIAGNOSIS — R262 Difficulty in walking, not elsewhere classified: Secondary | ICD-10-CM | POA: Diagnosis not present

## 2017-05-29 DIAGNOSIS — H43812 Vitreous degeneration, left eye: Secondary | ICD-10-CM | POA: Diagnosis not present

## 2017-05-29 DIAGNOSIS — H2513 Age-related nuclear cataract, bilateral: Secondary | ICD-10-CM | POA: Diagnosis not present

## 2017-05-29 DIAGNOSIS — H524 Presbyopia: Secondary | ICD-10-CM | POA: Diagnosis not present

## 2017-06-01 DIAGNOSIS — R262 Difficulty in walking, not elsewhere classified: Secondary | ICD-10-CM | POA: Diagnosis not present

## 2017-06-01 DIAGNOSIS — Z96642 Presence of left artificial hip joint: Secondary | ICD-10-CM | POA: Diagnosis not present

## 2017-06-01 DIAGNOSIS — R531 Weakness: Secondary | ICD-10-CM | POA: Diagnosis not present

## 2017-06-04 ENCOUNTER — Other Ambulatory Visit: Payer: Self-pay | Admitting: Interventional Cardiology

## 2017-06-05 DIAGNOSIS — R262 Difficulty in walking, not elsewhere classified: Secondary | ICD-10-CM | POA: Diagnosis not present

## 2017-06-05 DIAGNOSIS — Z96642 Presence of left artificial hip joint: Secondary | ICD-10-CM | POA: Diagnosis not present

## 2017-06-05 DIAGNOSIS — R531 Weakness: Secondary | ICD-10-CM | POA: Diagnosis not present

## 2017-06-07 DIAGNOSIS — Z800 Family history of malignant neoplasm of digestive organs: Secondary | ICD-10-CM | POA: Diagnosis not present

## 2017-06-07 DIAGNOSIS — K52832 Lymphocytic colitis: Secondary | ICD-10-CM | POA: Diagnosis not present

## 2017-06-07 DIAGNOSIS — Z8679 Personal history of other diseases of the circulatory system: Secondary | ICD-10-CM | POA: Diagnosis not present

## 2017-06-07 DIAGNOSIS — K219 Gastro-esophageal reflux disease without esophagitis: Secondary | ICD-10-CM | POA: Diagnosis not present

## 2017-06-15 DIAGNOSIS — M791 Myalgia, unspecified site: Secondary | ICD-10-CM | POA: Diagnosis not present

## 2017-06-15 DIAGNOSIS — M48062 Spinal stenosis, lumbar region with neurogenic claudication: Secondary | ICD-10-CM | POA: Diagnosis not present

## 2017-06-15 DIAGNOSIS — G894 Chronic pain syndrome: Secondary | ICD-10-CM | POA: Diagnosis not present

## 2017-07-06 ENCOUNTER — Other Ambulatory Visit: Payer: Self-pay | Admitting: Interventional Cardiology

## 2017-07-18 DIAGNOSIS — M1612 Unilateral primary osteoarthritis, left hip: Secondary | ICD-10-CM | POA: Diagnosis not present

## 2017-07-20 DIAGNOSIS — G894 Chronic pain syndrome: Secondary | ICD-10-CM | POA: Diagnosis not present

## 2017-07-20 DIAGNOSIS — M48062 Spinal stenosis, lumbar region with neurogenic claudication: Secondary | ICD-10-CM | POA: Diagnosis not present

## 2017-07-20 DIAGNOSIS — M791 Myalgia, unspecified site: Secondary | ICD-10-CM | POA: Diagnosis not present

## 2017-07-22 ENCOUNTER — Other Ambulatory Visit: Payer: Self-pay | Admitting: Vascular Surgery

## 2017-07-27 ENCOUNTER — Other Ambulatory Visit: Payer: Self-pay | Admitting: Family

## 2017-07-27 DIAGNOSIS — Z1389 Encounter for screening for other disorder: Secondary | ICD-10-CM | POA: Diagnosis not present

## 2017-07-27 DIAGNOSIS — I739 Peripheral vascular disease, unspecified: Secondary | ICD-10-CM | POA: Diagnosis not present

## 2017-07-27 DIAGNOSIS — G4733 Obstructive sleep apnea (adult) (pediatric): Secondary | ICD-10-CM | POA: Diagnosis not present

## 2017-07-27 DIAGNOSIS — K219 Gastro-esophageal reflux disease without esophagitis: Secondary | ICD-10-CM | POA: Diagnosis not present

## 2017-07-27 DIAGNOSIS — Z0000 Encounter for general adult medical examination without abnormal findings: Secondary | ICD-10-CM | POA: Diagnosis not present

## 2017-07-27 DIAGNOSIS — F324 Major depressive disorder, single episode, in partial remission: Secondary | ICD-10-CM | POA: Diagnosis not present

## 2017-07-27 DIAGNOSIS — I10 Essential (primary) hypertension: Secondary | ICD-10-CM | POA: Diagnosis not present

## 2017-07-27 DIAGNOSIS — Z125 Encounter for screening for malignant neoplasm of prostate: Secondary | ICD-10-CM | POA: Diagnosis not present

## 2017-07-27 DIAGNOSIS — J8410 Pulmonary fibrosis, unspecified: Secondary | ICD-10-CM | POA: Diagnosis not present

## 2017-07-27 DIAGNOSIS — I2510 Atherosclerotic heart disease of native coronary artery without angina pectoris: Secondary | ICD-10-CM | POA: Diagnosis not present

## 2017-07-27 DIAGNOSIS — F419 Anxiety disorder, unspecified: Secondary | ICD-10-CM | POA: Diagnosis not present

## 2017-07-27 DIAGNOSIS — K52832 Lymphocytic colitis: Secondary | ICD-10-CM | POA: Diagnosis not present

## 2017-07-27 MED ORDER — CLOPIDOGREL BISULFATE 75 MG PO TABS: 75.0000 mg | ORAL_TABLET | Freq: Every day | ORAL | 2 refills | Status: DC

## 2017-08-03 DIAGNOSIS — H2513 Age-related nuclear cataract, bilateral: Secondary | ICD-10-CM | POA: Diagnosis not present

## 2017-08-03 DIAGNOSIS — H43812 Vitreous degeneration, left eye: Secondary | ICD-10-CM | POA: Diagnosis not present

## 2017-08-07 DIAGNOSIS — K52832 Lymphocytic colitis: Secondary | ICD-10-CM | POA: Diagnosis not present

## 2017-08-07 DIAGNOSIS — K219 Gastro-esophageal reflux disease without esophagitis: Secondary | ICD-10-CM | POA: Diagnosis not present

## 2017-08-07 DIAGNOSIS — Z800 Family history of malignant neoplasm of digestive organs: Secondary | ICD-10-CM | POA: Diagnosis not present

## 2017-08-07 DIAGNOSIS — Z8679 Personal history of other diseases of the circulatory system: Secondary | ICD-10-CM | POA: Diagnosis not present

## 2017-08-22 DIAGNOSIS — M791 Myalgia, unspecified site: Secondary | ICD-10-CM | POA: Diagnosis not present

## 2017-08-22 DIAGNOSIS — Z6831 Body mass index (BMI) 31.0-31.9, adult: Secondary | ICD-10-CM | POA: Diagnosis not present

## 2017-08-22 DIAGNOSIS — G894 Chronic pain syndrome: Secondary | ICD-10-CM | POA: Diagnosis not present

## 2017-08-22 DIAGNOSIS — M48062 Spinal stenosis, lumbar region with neurogenic claudication: Secondary | ICD-10-CM | POA: Diagnosis not present

## 2017-08-30 ENCOUNTER — Other Ambulatory Visit: Payer: Self-pay | Admitting: Interventional Cardiology

## 2017-08-30 MED ORDER — METOPROLOL SUCCINATE ER 50 MG PO TB24: 50.0000 mg | ORAL_TABLET | Freq: Every day | ORAL | 0 refills | Status: DC

## 2017-08-30 MED ORDER — ATORVASTATIN CALCIUM 40 MG PO TABS: ORAL_TABLET | ORAL | 0 refills | Status: DC

## 2017-09-14 ENCOUNTER — Other Ambulatory Visit: Payer: Self-pay | Admitting: Interventional Cardiology

## 2017-09-14 MED ORDER — VALSARTAN 320 MG PO TABS: 320.0000 mg | ORAL_TABLET | Freq: Every day | ORAL | 2 refills | Status: DC

## 2017-09-19 DIAGNOSIS — G894 Chronic pain syndrome: Secondary | ICD-10-CM | POA: Diagnosis not present

## 2017-09-19 DIAGNOSIS — M791 Myalgia, unspecified site: Secondary | ICD-10-CM | POA: Diagnosis not present

## 2017-09-19 DIAGNOSIS — M48062 Spinal stenosis, lumbar region with neurogenic claudication: Secondary | ICD-10-CM | POA: Diagnosis not present

## 2017-10-17 DIAGNOSIS — M961 Postlaminectomy syndrome, not elsewhere classified: Secondary | ICD-10-CM | POA: Diagnosis not present

## 2017-10-17 DIAGNOSIS — M7061 Trochanteric bursitis, right hip: Secondary | ICD-10-CM | POA: Diagnosis not present

## 2017-10-17 DIAGNOSIS — G894 Chronic pain syndrome: Secondary | ICD-10-CM | POA: Diagnosis not present

## 2017-10-19 DIAGNOSIS — M1612 Unilateral primary osteoarthritis, left hip: Secondary | ICD-10-CM | POA: Diagnosis not present

## 2017-11-12 NOTE — Progress Notes (Signed)
Cardiology Office Note:    Date:  11/13/2017   ID:  Richard Parrish, DOB 01-15-1951, MRN [id number]  PCP:  Richard Carol, MD  Cardiologist:  Richard Grooms, MD   Referring MD: Richard Carol, MD   Chief Complaint  Patient presents with  . Coronary Artery Disease  . Claudication    History of Present Illness:    Richard Parrish is a 67 y.o. male with a hx of CAD, prior bypass graft to RCA for failed angioplasty , patent but diffusely diseased SVG to RCA document by catheterization 2012 , severe degenerative disc disease, depression, abdominal aortic aneurysm , PAD with prior left superficial femoral and popliteal atherectomy in-stent, and hypertension.  He is limited by musculoskeletal complaints involving his lower back, and also neck and intrathoracic pain.  No anginal complaints.  Has dyspnea when he squats.  No chest discomfort at rest.  No peripheral edema.  Past Medical History:  Diagnosis Date  . Anxiety   . Arthritis    "lower back" (04/07/2017)  . BPH (benign prostatic hypertrophy)   . C. difficile diarrhea   . CAD (coronary artery disease)   . Chronic lower back pain   . Coronary artery disease 1989   cabg with dvg to rca   . Depression   . GERD (gastroesophageal reflux disease)   . Hyperlipidemia   . Hypertension   . Insomnia   . Leg pain   . Myocardial infarction ([place]) 2002  . OSA on CPAP    cpap setting of 60 per pt  . Osteoarthritis    "hips" (04/07/2017)  . PAD (peripheral artery disease) ([place])   . Peripheral vascular disease ([place])   . Pneumonia 1989   S/P CABG  . Pulmonary fibrosis ([place])    "one time" (04/07/2017)  . Spinal stenosis     Past Surgical History:  Procedure Laterality Date  . ABDOMINAL AORTAGRAM N/A 03/15/2011   Procedure: ABDOMINAL Maxcine Ham;  Surgeon: Serafina Mitchell, MD;  Location: [facility] CATH LAB;  Service: Cardiovascular;  Laterality: N/A;  . ABDOMINAL AORTAGRAM N/A 04/12/2011   Procedure: ABDOMINAL Maxcine Ham;  Surgeon: Serafina Mitchell, MD;  Location: [facility] C[facility] LAB;  Service: Cardiovascular;  Laterality: N/A;  . BACK SURGERY    . CARDIAC CATHETERIZATION  08/1987  . CORONARY ANGIOPLASTY WITH STENT PLACEMENT  ~ [facility], [place]  . CORONARY ARTERY BYPASS GRAFT  09/1987   "CABG X1";  [facility]; [place]; "RCA"  . JOINT REPLACEMENT    . KNEE ARTHROSCOPY Left   . LAPAROSCOPIC CHOLECYSTECTOMY    . PERIPHERAL VASCULAR INTERVENTION Left 2016   "put a stent; right branch of left femoral"  . POSTERIOR LUMBAR FUSION  2009   lumbar fusion, S L 5 to L 4  . SHOULDER ARTHROSCOPY W/ ROTATOR CUFF REPAIR Right 2006  . SHOULDER ARTHROSCOPY WITH SUBACROMIAL DECOMPRESSION AND OPEN ROTATOR C Left ~ 1983  . TONSILLECTOMY AND ADENOIDECTOMY    . TOTAL HIP ARTHROPLASTY Right 2012  . TOTAL HIP ARTHROPLASTY Left 04/07/2017  . TOTAL HIP ARTHROPLASTY Left 04/07/2017   Procedure: LEFT TOTAL HIP ARTHROPLASTY;  Surgeon: Earlie Server, MD;  Location: [place];  Service: Orthopedics;  Laterality: Left;    Current Medications: Current Meds  Medication Sig  . acetaminophen (TYLENOL) 500 MG tablet Take 500 mg by mouth every 6 (six) hours as needed.  . APRISO 0.375 g 24 hr capsule Take 4 capsules by mouth every morning.  Marland Kitchen aspirin  EC 325 MG tablet Take 1 tablet (325 mg total) by mouth daily. Take in place of your 81mg  aspirin for 2 weeks then may resume normal dose  . clopidogrel (PLAVIX) 75 MG tablet Take 1 tablet (75 mg total) by mouth daily.  . Glucosamine HCl-MSM (GLUCOSAMINE-MSM PO) Take 1 tablet by mouth every other day.  Marland Kitchen HYDROcodone-acetaminophen (NORCO) 7.5-325 MG tablet Take 1 tablet by mouth 5 (five) times daily.  . hyoscyamine (LEVSIN, ANASPAZ) 0.125 MG tablet Take 0.125 mg by mouth every 4 (four) hours as needed for cramping.   . metoprolol succinate (TOPROL-XL) 50 MG 24 hr tablet Take 1 tablet (50 mg total) by mouth daily. Please keep upcoming appt in September with Dr. Tamala Julian for future refills. Thank you  .  Multiple Vitamin (MULTIVITAMIN) tablet Take 1 tablet by mouth every other day.   . Naproxen Sodium (ALEVE PO) Take 1 tablet by mouth 2 (two) times daily.  . nitroGLYCERIN (NITROSTAT) 0.4 MG SL tablet Place 0.4 mg under the tongue every 5 (five) minutes as needed for chest pain.  . Omega-3 Fatty Acids (FISH OIL) 1000 MG CAPS Take 1,000 mg by mouth every other day.   Marland Kitchen omeprazole (PRILOSEC OTC) 20 MG tablet Take 20 mg by mouth daily before breakfast.  . PARoxetine (PAXIL) 40 MG tablet Take 40 mg by mouth daily.  . Probiotic Product (PROBIOTIC PO) Take 1 capsule by mouth daily.  . tamsulosin (FLOMAX) 0.4 MG CAPS capsule Take 1 capsule by mouth every morning.  Marland Kitchen tiZANidine (ZANAFLEX) 2 MG tablet Take 2 mg by mouth every 6 (six) hours as needed. For muscle spasms.  . valsartan (DIOVAN) 320 MG tablet Take 1 tablet (320 mg total) by mouth daily. Please keep upcoming appt in September for future refills. Thank you  . [DISCONTINUED] atorvastatin (LIPITOR) 40 MG tablet TAKE 1 TABLET BY MOUTH EVERY DAY. Please keep upcoming appt with Dr. Tamala Julian. Thanks you     Allergies:   Ambien [zolpidem tartrate]   Social History   Socioeconomic History  . Marital status: Married    Spouse name: Not on file  . Number of children: Not on file  . Years of education: Not on file  . Highest education level: Not on file  Occupational History  . Occupation: Retired  Scientific laboratory technician  . Financial resource strain: Not on file  . Food insecurity:    Worry: Not on file    Inability: Not on file  . Transportation needs:    Medical: Not on file    Non-medical: Not on file  Tobacco Use  . Smoking status: Former Smoker    Packs/day: 2.00    Years: 15.00    Pack years: 30.00    Types: Cigarettes    Last attempt to quit: 03/01/1987    Years since quitting: 30.7  . Smokeless tobacco: Never Used  Substance and Sexual Activity  . Alcohol use: Yes    Comment: 04/07/2017 'couple drinks/month"  . Drug use: No  . Sexual  activity: Not Currently  Lifestyle  . Physical activity:    Days per week: Not on file    Minutes per session: Not on file  . Stress: Not on file  Relationships  . Social connections:    Talks on phone: Not on file    Gets together: Not on file    Attends religious service: Not on file    Active member of club or organization: Not on file    Attends meetings of clubs  or organizations: Not on file    Relationship status: Not on file  Other Topics Concern  . Not on file  Social History Narrative   ** Merged History Encounter **         Family History: The patient's family history includes Cancer in his father; Heart disease in his mother.  ROS:   Please see the history of present illness.    Muscle pain, unexplained weight loss, recent fever, skipped heartbeat.  All other systems reviewed and are negative.  EKGs/Labs/Other Studies Reviewed:    The following studies were reviewed today: Abdominal CT: 01/2017 IMPRESSION: 1. No acute findings in the abdomen pelvis. 2. Chronic interstitial thickening at the lung bases. 3.  Aortic Atherosclerosis (ICD10-I70.0).  EKG:  EKG is  ordered today.  EKG performed today demonstrates normal sinus rhythm and normal appearance.  (Heart rate 54 so technically sinus bradycardia).  Recent Labs: 03/24/2017: ALT 21 04/08/2017: BUN 15; Creatinine, Ser 1.00; Potassium 3.8; Sodium 138 04/10/2017: Hemoglobin 10.7; Platelets 255  Recent Lipid Panel    Component Value Date/Time   CHOL 145 09/18/2015 0959   TRIG 52 09/18/2015 0959   HDL 53 09/18/2015 0959   CHOLHDL 2.7 09/18/2015 0959   VLDL 10 09/18/2015 0959   LDLCALC 82 09/18/2015 0959    Physical Exam:    VS:  BP 126/80   Pulse (!) 54   Ht 6' (1.829 m)   Wt 217 lb (98.4 kg)   BMI 29.43 kg/m     Wt Readings from Last 3 Encounters:  11/13/17 217 lb (98.4 kg)  03/24/17 228 lb 12.8 oz (103.8 kg)  02/07/17 225 lb (102.1 kg)     GEN: Slender and appears younger than stated age.  Well  nourished, well developed in no acute distress HEENT: Normal NECK: No JVD. LYMPHATICS: No lymphadenopathy CARDIAC: RRR, no murmur, no gallop, no edema. VASCULAR: Bilateral 1+ dorsalis, 2+ bilateral carotid and 2+ bilateral radial pulses.  No bruits. RESPIRATORY:  Clear to auscultation without rales, wheezing or rhonchi  ABDOMEN: Soft, non-tender, non-distended, No pulsatile mass, MUSCULOSKELETAL: No deformity  SKIN: Warm and dry NEUROLOGIC:  Alert and oriented x 3 PSYCHIATRIC:  Normal affect   ASSESSMENT:    1. Coronary artery disease of native artery of native heart with stable angina pectoris ([place])   2. Essential hypertension, benign   3. Peripheral vascular disease, unspecified ([place])   4. Pure hypercholesterolemia   5. Hyperglycemia   6. Aortic atherosclerosis ([facility])    PLAN:    In order of problems listed above:  1. Stable CAD without angina.  Limited physical activity due to severe lumbosacral spine disease. 2. Target blood pressure 130/80 mmHg is being achieved. 3. Bilateral dorsalis pedis pulses.  Abdominal atherosclerosis noted on CT scan.  No evidence of aneurysm. 4. LDL target less than 70.  Increase atorvastatin 80 mg/day.  Lipid panel in 6 to 8 weeks.  We will also do a hemoglobin A1c at that time. 5. Hemoglobin A1c will be checked. 6. Management is as for CAD noted above.  Clinical follow-up in 1 year.   Medication Adjustments/Labs and Tests Ordered: Current medicines are reviewed at length with the patient today.  Concerns regarding medicines are outlined above.  Orders Placed This Encounter  Procedures  . Lipid Profile  . Hepatic function panel  . HgB A1c  . EKG 12-Lead   Meds ordered this encounter  Medications  . DISCONTD: atorvastatin (LIPITOR) 80 MG tablet    Sig: Take 1  tablet (80 mg total) by mouth daily.    Dispense:  90 tablet    Refill:  3    Patient Instructions  Medication Instructions: Your physician has recommended you make the  following change in your medication: increase your Atorvastatin to 80mg  a day    Labwork: Your physician recommends that you return for lab work in: Lipid and Hepatic panel and HGBA1C in 8 weeks.    Testing/Procedures: None ordered   Follow-Up: Your physician recommends that you schedule a follow-up appointment in:  9 -12 months with Dr. Tamala Julian    Any Other Special Instructions Will Be Listed Below (If Applicable).     If you need a refill on your cardiac medications before your next appointment, please call your pharmacy.     Signed, Richard Grooms, MD  11/13/2017 10:46 AM    [place]

## 2017-11-13 ENCOUNTER — Other Ambulatory Visit: Payer: Self-pay

## 2017-11-13 ENCOUNTER — Encounter: Payer: Self-pay | Admitting: Interventional Cardiology

## 2017-11-13 ENCOUNTER — Ambulatory Visit (INDEPENDENT_AMBULATORY_CARE_PROVIDER_SITE_OTHER): Payer: Medicare Other | Admitting: Interventional Cardiology

## 2017-11-13 VITALS — BP 126/80 | HR 54 | Ht 72.0 in | Wt 217.0 lb

## 2017-11-13 DIAGNOSIS — I739 Peripheral vascular disease, unspecified: Secondary | ICD-10-CM | POA: Diagnosis not present

## 2017-11-13 DIAGNOSIS — I25118 Atherosclerotic heart disease of native coronary artery with other forms of angina pectoris: Secondary | ICD-10-CM | POA: Diagnosis not present

## 2017-11-13 DIAGNOSIS — I700 Atherosclerosis of aorta: Secondary | ICD-10-CM | POA: Insufficient documentation

## 2017-11-13 DIAGNOSIS — E7800 Pure hypercholesterolemia, unspecified: Secondary | ICD-10-CM | POA: Insufficient documentation

## 2017-11-13 DIAGNOSIS — R739 Hyperglycemia, unspecified: Secondary | ICD-10-CM | POA: Diagnosis not present

## 2017-11-13 DIAGNOSIS — I10 Essential (primary) hypertension: Secondary | ICD-10-CM

## 2017-11-13 MED ORDER — ATORVASTATIN CALCIUM 80 MG PO TABS: 80.0000 mg | ORAL_TABLET | Freq: Every day | ORAL | 3 refills | Status: DC

## 2017-11-13 NOTE — Patient Instructions (Signed)
Medication Instructions: Your physician has recommended you make the following change in your medication: increase your Atorvastatin to 80mg  a day    Labwork: Your physician recommends that you return for lab work in: Lipid and Hepatic panel and HGBA1C in 8 weeks.    Testing/Procedures: None ordered   Follow-Up: Your physician recommends that you schedule a follow-up appointment in:  9 -12 months with Dr. Tamala Julian    Any Other Special Instructions Will Be Listed Below (If Applicable).     If you need a refill on your cardiac medications before your next appointment, please call your pharmacy.

## 2017-11-14 DIAGNOSIS — G894 Chronic pain syndrome: Secondary | ICD-10-CM | POA: Diagnosis not present

## 2017-11-14 DIAGNOSIS — M545 Low back pain: Secondary | ICD-10-CM | POA: Diagnosis not present

## 2017-11-14 DIAGNOSIS — M48062 Spinal stenosis, lumbar region with neurogenic claudication: Secondary | ICD-10-CM | POA: Diagnosis not present

## 2017-11-14 DIAGNOSIS — M5136 Other intervertebral disc degeneration, lumbar region: Secondary | ICD-10-CM | POA: Diagnosis not present

## 2017-11-17 ENCOUNTER — Other Ambulatory Visit: Payer: Self-pay | Admitting: Orthopaedic Surgery

## 2017-11-17 DIAGNOSIS — M545 Low back pain: Secondary | ICD-10-CM

## 2017-11-21 ENCOUNTER — Ambulatory Visit
Admission: RE | Admit: 2017-11-21 | Discharge: 2017-11-21 | Disposition: A | Discharge status: Home / Self Care | Payer: Medicare Other | Source: Ambulatory Visit | Attending: Orthopaedic Surgery | Admitting: Orthopaedic Surgery

## 2017-11-21 DIAGNOSIS — M545 Low back pain: Secondary | ICD-10-CM | POA: Diagnosis not present

## 2017-12-07 ENCOUNTER — Other Ambulatory Visit: Payer: Self-pay | Admitting: Interventional Cardiology

## 2017-12-08 ENCOUNTER — Ambulatory Visit (INDEPENDENT_AMBULATORY_CARE_PROVIDER_SITE_OTHER): Payer: Medicare Other | Admitting: Podiatry

## 2017-12-08 ENCOUNTER — Encounter: Payer: Self-pay | Admitting: Podiatry

## 2017-12-08 VITALS — BP 107/59 | HR 58

## 2017-12-08 DIAGNOSIS — M79676 Pain in unspecified toe(s): Secondary | ICD-10-CM | POA: Diagnosis not present

## 2017-12-08 DIAGNOSIS — L603 Nail dystrophy: Secondary | ICD-10-CM

## 2017-12-08 DIAGNOSIS — L601 Onycholysis: Secondary | ICD-10-CM

## 2017-12-13 DIAGNOSIS — M4726 Other spondylosis with radiculopathy, lumbar region: Secondary | ICD-10-CM | POA: Diagnosis not present

## 2017-12-13 DIAGNOSIS — G894 Chronic pain syndrome: Secondary | ICD-10-CM | POA: Diagnosis not present

## 2017-12-19 ENCOUNTER — Telehealth: Payer: Self-pay

## 2017-12-19 NOTE — Telephone Encounter (Signed)
   [facility] Pre-operative Risk Assessment    Request for surgical clearance:  1. What type of surgery is being performed?  Lumbar spine fusion, insertion of interbody device, spinal bone allograft, anterior instrumentation, 2 -3 Vert Seg, computer to insert pedicle screws   2. When is this surgery scheduled? TBD   3. What type of clearance is required (medical clearance vs. Pharmacy clearance to hold med vs. Both)? Both  4. Are there any medications that need to be held prior to surgery and how long? Plavix and ASA 7 days hold  5. Practice name and name of physician performing surgery? [facility]/ Dr. Rennis Harding  6. What is your office phone number [phone]  Gabriela Eves   7.   What is your office fax number  [phone]  8.   Anesthesia type (None, local, MAC, general) ? Unknown   Michaelyn Barter 12/19/2017, 12:57 PM  _________________________________________________________________ Koleen Nimrod the following  - the most recent office note, lab work, and EKG if available.

## 2017-12-20 NOTE — Telephone Encounter (Signed)
Okay to hold Plavix as outlined.

## 2017-12-20 NOTE — Telephone Encounter (Signed)
   Primary Cardiologist: Sinclair Grooms, MD  Chart reviewed as part of pre-operative protocol coverage. Patient was contacted 12/20/2017 in reference to pre-operative risk assessment for pending surgery as outlined below.  Richard Parrish was last seen on 11/13/17 by Dr. Tamala Julian.  Since that day, Richard Parrish has done well. He does upper body strength training 4-5 days/week without any angina.   Therefore, based on ACC/AHA guidelines, the patient would be at acceptable risk for the planned procedure without further cardiovascular testing.   Dr. Tamala Julian, Is it okay to hold DAPT as requested? Please forward your response to P [facility] PREOP.   Thank you  [name], PA 12/20/2017, 11:47 AM

## 2017-12-21 NOTE — Telephone Encounter (Signed)
   I will route this recommendation to the requesting party via Epic fax function and remove from pre-op pool.  Please call with questions.  [place], [place] 12/21/2017, 11:35 AM

## 2017-12-26 DIAGNOSIS — Z23 Encounter for immunization: Secondary | ICD-10-CM | POA: Diagnosis not present

## 2018-01-08 ENCOUNTER — Other Ambulatory Visit: Payer: Medicare Other | Admitting: *Deleted

## 2018-01-08 DIAGNOSIS — I739 Peripheral vascular disease, unspecified: Secondary | ICD-10-CM | POA: Diagnosis not present

## 2018-01-08 DIAGNOSIS — R739 Hyperglycemia, unspecified: Secondary | ICD-10-CM

## 2018-01-08 DIAGNOSIS — I25118 Atherosclerotic heart disease of native coronary artery with other forms of angina pectoris: Secondary | ICD-10-CM

## 2018-01-08 DIAGNOSIS — E7800 Pure hypercholesterolemia, unspecified: Secondary | ICD-10-CM

## 2018-01-08 DIAGNOSIS — I10 Essential (primary) hypertension: Secondary | ICD-10-CM

## 2018-01-09 LAB — LIPID PANEL
Chol/HDL Ratio: 3 ratio (ref 0.0–5.0)
Cholesterol, Total: 127 mg/dL (ref 100–199)
HDL: 43 mg/dL (ref >39)
LDL Calculated: 69 mg/dL (ref 0–99)
TRIGLYCERIDES: 77 mg/dL (ref 0–149)
VLDL Cholesterol Cal: 15 mg/dL (ref 5–40)

## 2018-01-09 LAB — HEPATIC FUNCTION PANEL
ALK PHOS: 80 IU/L (ref 39–117)
ALT: 29 IU/L (ref 0–44)
AST: 31 IU/L (ref 0–40)
Albumin: 4.6 g/dL (ref 3.6–4.8)
Bilirubin Total: 0.5 mg/dL (ref 0.0–1.2)
Bilirubin, Direct: 0.16 mg/dL (ref 0.00–0.40)
Total Protein: 6.5 g/dL (ref 6.0–8.5)

## 2018-01-09 LAB — HEMOGLOBIN A1C
Est. average glucose Bld gHb Est-mCnc: 100 mg/dL
HEMOGLOBIN A1C: 5.1 % (ref 4.8–5.6)

## 2018-01-19 DIAGNOSIS — G894 Chronic pain syndrome: Secondary | ICD-10-CM | POA: Diagnosis not present

## 2018-01-19 DIAGNOSIS — M5136 Other intervertebral disc degeneration, lumbar region: Secondary | ICD-10-CM | POA: Diagnosis not present

## 2018-01-19 DIAGNOSIS — M961 Postlaminectomy syndrome, not elsewhere classified: Secondary | ICD-10-CM | POA: Diagnosis not present

## 2018-01-23 DIAGNOSIS — I2510 Atherosclerotic heart disease of native coronary artery without angina pectoris: Secondary | ICD-10-CM | POA: Diagnosis not present

## 2018-01-23 DIAGNOSIS — I739 Peripheral vascular disease, unspecified: Secondary | ICD-10-CM | POA: Diagnosis not present

## 2018-01-23 DIAGNOSIS — F419 Anxiety disorder, unspecified: Secondary | ICD-10-CM | POA: Diagnosis not present

## 2018-01-23 DIAGNOSIS — I10 Essential (primary) hypertension: Secondary | ICD-10-CM | POA: Diagnosis not present

## 2018-01-23 DIAGNOSIS — E7800 Pure hypercholesterolemia, unspecified: Secondary | ICD-10-CM | POA: Diagnosis not present

## 2018-01-23 DIAGNOSIS — G894 Chronic pain syndrome: Secondary | ICD-10-CM | POA: Diagnosis not present

## 2018-01-31 DIAGNOSIS — R799 Abnormal finding of blood chemistry, unspecified: Secondary | ICD-10-CM | POA: Diagnosis not present

## 2018-01-31 DIAGNOSIS — R791 Abnormal coagulation profile: Secondary | ICD-10-CM | POA: Diagnosis not present

## 2018-01-31 DIAGNOSIS — Z01812 Encounter for preprocedural laboratory examination: Secondary | ICD-10-CM | POA: Diagnosis not present

## 2018-02-09 DIAGNOSIS — M48062 Spinal stenosis, lumbar region with neurogenic claudication: Secondary | ICD-10-CM | POA: Diagnosis not present

## 2018-02-09 DIAGNOSIS — Z981 Arthrodesis status: Secondary | ICD-10-CM | POA: Diagnosis not present

## 2018-02-09 DIAGNOSIS — M5106 Intervertebral disc disorders with myelopathy, lumbar region: Secondary | ICD-10-CM | POA: Diagnosis present

## 2018-02-09 DIAGNOSIS — I10 Essential (primary) hypertension: Secondary | ICD-10-CM | POA: Diagnosis present

## 2018-02-09 DIAGNOSIS — M5116 Intervertebral disc disorders with radiculopathy, lumbar region: Secondary | ICD-10-CM | POA: Diagnosis present

## 2018-02-09 DIAGNOSIS — M4726 Other spondylosis with radiculopathy, lumbar region: Secondary | ICD-10-CM | POA: Diagnosis not present

## 2018-02-09 DIAGNOSIS — M961 Postlaminectomy syndrome, not elsewhere classified: Secondary | ICD-10-CM | POA: Diagnosis not present

## 2018-02-09 DIAGNOSIS — M532X6 Spinal instabilities, lumbar region: Secondary | ICD-10-CM | POA: Diagnosis not present

## 2018-02-09 DIAGNOSIS — M47817 Spondylosis without myelopathy or radiculopathy, lumbosacral region: Secondary | ICD-10-CM | POA: Diagnosis not present

## 2018-02-09 DIAGNOSIS — Z951 Presence of aortocoronary bypass graft: Secondary | ICD-10-CM | POA: Diagnosis not present

## 2018-02-09 DIAGNOSIS — M48061 Spinal stenosis, lumbar region without neurogenic claudication: Secondary | ICD-10-CM | POA: Diagnosis present

## 2018-02-09 DIAGNOSIS — G894 Chronic pain syndrome: Secondary | ICD-10-CM | POA: Diagnosis present

## 2018-02-09 DIAGNOSIS — M810 Age-related osteoporosis without current pathological fracture: Secondary | ICD-10-CM | POA: Diagnosis present

## 2018-02-09 DIAGNOSIS — I2510 Atherosclerotic heart disease of native coronary artery without angina pectoris: Secondary | ICD-10-CM | POA: Diagnosis present

## 2018-02-10 DIAGNOSIS — M5136 Other intervertebral disc degeneration, lumbar region: Secondary | ICD-10-CM | POA: Insufficient documentation

## 2018-03-09 DIAGNOSIS — M47816 Spondylosis without myelopathy or radiculopathy, lumbar region: Secondary | ICD-10-CM | POA: Diagnosis not present

## 2018-03-18 NOTE — Progress Notes (Signed)
Subjective:  Patient ID: Richard Parrish, male    DOB: 02/15/1951,  MRN: [id number]  Chief Complaint  Patient presents with  . Nail Problem    left foot great toenail is coming off due to injury   68 y.o. male presents with the above complaint.  States that he stumped his toe during a construction incident and it is hurting on the outside.  Review of Systems: Negative except as noted in the HPI. Denies N/V/F/Ch.  Past Medical History:  Diagnosis Date  . Anxiety   . Arthritis    "lower back" (04/07/2017)  . BPH (benign prostatic hypertrophy)   . C. difficile diarrhea   . CAD (coronary artery disease)   . Chronic lower back pain   . Coronary artery disease 1989   cabg with dvg to rca   . Depression   . GERD (gastroesophageal reflux disease)   . Hyperlipidemia   . Hypertension   . Insomnia   . Leg pain   . Myocardial infarction ([place]) 2002  . OSA on CPAP    cpap setting of 60 per pt  . Osteoarthritis    "hips" (04/07/2017)  . PAD (peripheral artery disease) ([place])   . Peripheral vascular disease ([place])   . Pneumonia 1989   S/P CABG  . Pulmonary fibrosis ([place])    "one time" (04/07/2017)  . Spinal stenosis     Current Outpatient Medications:  .  acetaminophen (TYLENOL) 500 MG tablet, Take 500 mg by mouth every 6 (six) hours as needed., Disp: , Rfl:  .  APRISO 0.375 g 24 hr capsule, Take 4 capsules by mouth every morning., Disp: , Rfl: 1 .  aspirin EC 325 MG tablet, Take 1 tablet (325 mg total) by mouth daily. Take in place of your 81mg  aspirin for 2 weeks then may resume normal dose, Disp: 30 tablet, Rfl: 0 .  atorvastatin (LIPITOR) 80 MG tablet, Take 1 tablet (80 mg total) by mouth daily., Disp: 90 tablet, Rfl: 3 .  clopidogrel (PLAVIX) 75 MG tablet, Take 1 tablet (75 mg total) by mouth daily., Disp: 90 tablet, Rfl: 2 .  Glucosamine HCl-MSM (GLUCOSAMINE-MSM PO), Take 1 tablet by mouth every other day., Disp: , Rfl:  .  HYDROcodone-acetaminophen (NORCO) 7.5-325 MG tablet, Take 1  tablet by mouth 5 (five) times daily., Disp: , Rfl:  .  hyoscyamine (LEVSIN, ANASPAZ) 0.125 MG tablet, Take 0.125 mg by mouth every 4 (four) hours as needed for cramping. , Disp: , Rfl:  .  metoprolol succinate (TOPROL-XL) 50 MG 24 hr tablet, TAKE 1 TABLET BY MOUTH EVERY DAY, Disp: 90 tablet, Rfl: 3 .  Multiple Vitamin (MULTIVITAMIN) tablet, Take 1 tablet by mouth every other day. , Disp: , Rfl:  .  Naproxen Sodium (ALEVE PO), Take 1 tablet by mouth 2 (two) times daily., Disp: , Rfl:  .  nitroGLYCERIN (NITROSTAT) 0.4 MG SL tablet, Place 0.4 mg under the tongue every 5 (five) minutes as needed for chest pain., Disp: , Rfl:  .  Omega-3 Fatty Acids (FISH OIL) 1000 MG CAPS, Take 1,000 mg by mouth every other day. , Disp: , Rfl:  .  omeprazole (PRILOSEC OTC) 20 MG tablet, Take 20 mg by mouth daily before breakfast., Disp: , Rfl:  .  PARoxetine (PAXIL) 40 MG tablet, Take 40 mg by mouth daily., Disp: , Rfl: 6 .  Probiotic Product (PROBIOTIC PO), Take 1 capsule by mouth daily., Disp: , Rfl:  .  tamsulosin (FLOMAX) 0.4 MG CAPS capsule, Take  1 capsule by mouth every morning., Disp: , Rfl: 5 .  tiZANidine (ZANAFLEX) 2 MG tablet, Take 2 mg by mouth every 6 (six) hours as needed. For muscle spasms., Disp: , Rfl: 2 .  valsartan (DIOVAN) 320 MG tablet, TAKE 1 TABLET BY MOUTH EVERY DAY, Disp: 90 tablet, Rfl: 3  Social History   Tobacco Use  Smoking Status Former Smoker  . Packs/day: 2.00  . Years: 15.00  . Pack years: 30.00  . Types: Cigarettes  . Last attempt to quit: 03/01/1987  . Years since quitting: 31.0  Smokeless Tobacco Never Used    Allergies  Allergen Reactions  . Ambien [Zolpidem Tartrate] Other (See Comments)    Bad dreams/Vivid Dreams   Objective:   Vitals:   12/08/17 1435  BP: (!) 107/59  Pulse: (!) 58   There is no height or weight on file to calculate BMI. Constitutional Well developed. Well nourished.  Vascular Dorsalis pedis pulses palpable bilaterally. Posterior tibial  pulses palpable bilaterally. Capillary refill normal to all digits.  No cyanosis or clubbing noted. Pedal hair growth normal.  Neurologic Normal speech. Oriented to person, place, and time. Epicritic sensation to light touch grossly present bilaterally.  Dermatologic Nails lysis on the lateral aspect of the left great toenail Skin intact  Orthopedic: Normal joint ROM without pain or crepitus bilaterally. No visible deformities. No bony tenderness.   Radiographs: None Assessment:   1. Nail dystrophy   2. Onycholysis   3. Pain around toenail    Plan:  Patient was evaluated and treated and all questions answered.  Lysis of toenail -Gently debrided back of loose aspect. -No need for full avulsion of the toenail advised the nail may avulsed on its own -Discussed permanent nail changes from the incident  No follow-ups on file.

## 2018-03-23 DIAGNOSIS — Z8679 Personal history of other diseases of the circulatory system: Secondary | ICD-10-CM | POA: Diagnosis not present

## 2018-03-23 DIAGNOSIS — Z800 Family history of malignant neoplasm of digestive organs: Secondary | ICD-10-CM | POA: Diagnosis not present

## 2018-03-23 DIAGNOSIS — K52832 Lymphocytic colitis: Secondary | ICD-10-CM | POA: Diagnosis not present

## 2018-03-23 DIAGNOSIS — K219 Gastro-esophageal reflux disease without esophagitis: Secondary | ICD-10-CM | POA: Diagnosis not present

## 2018-04-06 DIAGNOSIS — M7918 Myalgia, other site: Secondary | ICD-10-CM | POA: Diagnosis not present

## 2018-05-10 DIAGNOSIS — Z79891 Long term (current) use of opiate analgesic: Secondary | ICD-10-CM | POA: Diagnosis not present

## 2018-05-10 DIAGNOSIS — Z79899 Other long term (current) drug therapy: Secondary | ICD-10-CM | POA: Diagnosis not present

## 2018-05-10 DIAGNOSIS — M545 Low back pain: Secondary | ICD-10-CM | POA: Diagnosis not present

## 2018-05-10 DIAGNOSIS — G894 Chronic pain syndrome: Secondary | ICD-10-CM | POA: Diagnosis not present

## 2018-06-07 ENCOUNTER — Ambulatory Visit (INDEPENDENT_AMBULATORY_CARE_PROVIDER_SITE_OTHER): Payer: Medicare Other | Admitting: Podiatry

## 2018-06-07 ENCOUNTER — Other Ambulatory Visit: Payer: Self-pay

## 2018-06-07 ENCOUNTER — Encounter: Payer: Self-pay | Admitting: Podiatry

## 2018-06-07 DIAGNOSIS — M4326 Fusion of spine, lumbar region: Secondary | ICD-10-CM | POA: Diagnosis not present

## 2018-06-07 DIAGNOSIS — Q828 Other specified congenital malformations of skin: Secondary | ICD-10-CM

## 2018-06-07 NOTE — Progress Notes (Signed)
This patient presents the office with chief complaint of a painful area under the bottom of the outside ball of his left foot.  He says he feels he has developed a painful area that is significantly painful wearing certain shoes.  He says he feels that he does have a callus noted on the bottom of his left foot at the site of the pain.  He states that the pain appears to be worsening with time and presents the office today for an evaluation and treatment.  He says he has provided no self treatment nor sought any professional help.  Patient states that he is scheduled for nail care at the salon this afternoon.  This patient states he is also recovering from back surgery.  As the patient leaves I watch that he has an antalgic gait.  Finally he states he is concerned about the burning in the bottom of both feet.    General Appearance  Alert, conversant and in no acute stress.  Vascular  Dorsalis pedis and posterior tibial  pulses are palpable  bilaterally.  Capillary return is within normal limits  bilaterally. Temperature is within normal limits  bilaterally.  Neurologic  Senn-Weinstein monofilament wire test within normal limits  bilaterally. Muscle power within normal limits bilaterally.  Nails Normal nails with evidence of fungal or bacterial infection.  Orthopedic  No limitations of motion  feet .  No crepitus or effusions noted.  No bony pathology or digital deformities noted. HAV  B/L with DJD 1st MPJ  B/L.  Skin  normotropic skin with no porokeratosis noted bilaterally.  No signs of infections or ulcers noted.   Porokeratosis sub 5th met left foot.    Debridement of porokeratosis/callus left forefoot.  Patient requests that his nails not be treated at this visit due to her previous appointment.  Upon debridement of the callus there appears to be a black spot noted at the site of his pain.  I need to consider that he had a foreign body penetrate into the skin.  This callus could also be developed  due to a combination of arthritis the big toe joint left foot and his antalgic gait.  Patient was told he could benefit from Biofreeze application and possibly insoles worn in his shoes.  His callus does seem to be consistent with the timeframe of his back surgery.  RTC prn   Gardiner Barefoot DPM

## 2018-06-29 DIAGNOSIS — M4326 Fusion of spine, lumbar region: Secondary | ICD-10-CM | POA: Diagnosis not present

## 2018-06-29 DIAGNOSIS — M7918 Myalgia, other site: Secondary | ICD-10-CM | POA: Diagnosis not present

## 2018-06-29 DIAGNOSIS — M7061 Trochanteric bursitis, right hip: Secondary | ICD-10-CM | POA: Diagnosis not present

## 2018-07-20 DIAGNOSIS — K529 Noninfective gastroenteritis and colitis, unspecified: Secondary | ICD-10-CM | POA: Diagnosis not present

## 2018-07-24 DIAGNOSIS — R197 Diarrhea, unspecified: Secondary | ICD-10-CM | POA: Diagnosis not present

## 2018-08-15 DIAGNOSIS — G894 Chronic pain syndrome: Secondary | ICD-10-CM | POA: Diagnosis not present

## 2018-08-15 DIAGNOSIS — M960 Pseudarthrosis after fusion or arthrodesis: Secondary | ICD-10-CM | POA: Diagnosis not present

## 2018-08-15 DIAGNOSIS — M48062 Spinal stenosis, lumbar region with neurogenic claudication: Secondary | ICD-10-CM | POA: Diagnosis not present

## 2018-08-15 DIAGNOSIS — M961 Postlaminectomy syndrome, not elsewhere classified: Secondary | ICD-10-CM | POA: Diagnosis not present

## 2018-08-23 ENCOUNTER — Telehealth: Payer: Self-pay | Admitting: Interventional Cardiology

## 2018-08-23 NOTE — Telephone Encounter (Signed)
New Message     CONSENT FOR TELE-HEALTH VISIT - PLEASE REVIEW TO PATIENT  I hereby voluntarily request, consent and authorize [facility] and its employed or contracted physicians, physician assistants, nurse practitioners or other licensed health care professionals (the Practitioner), to provide me with telemedicine health care services (the "Services") as deemed necessary by the treating Practitioner. I acknowledge and consent to receive the Services by the Practitioner via telemedicine. I understand that the telemedicine visit will involve communicating with the Practitioner through live audiovisual communication technology and the disclosure of certain medical information by electronic transmission. I acknowledge that I have been given the opportunity to request an in-person assessment or other available alternative prior to the telemedicine visit and am voluntarily participating in the telemedicine visit.  I understand that I have the right to withhold or withdraw my consent to the use of telemedicine in the course of my care at any time, without affecting my right to future care or treatment, and that the Practitioner or I may terminate the  telemedicine visit at any time. I understand that I have the right to inspect all information obtained and/or recorded in the course of the telemedicine visit and may receive copies of available information for a reasonable fee. I understand that some of the potential risks of receiving the Services via telemedicine include:  Delay or interruption in medical evaluation due to technological equipment failure or disruption;  Information transmitted may not be sufficient (e.g. poor resolution of images) to allow for appropriate medical decision making by the Practitioner; and/or  In rare instances, security protocols could fail, causing a breach of personal health information.  Furthermore, I acknowledge that it is my responsibility to provide  information about my medical history, conditions and care that is complete and accurate to the best of my ability. I acknowledge that Practitioner's advice, recommendations, and/or decision may be based on factors not within their control, such as incomplete or inaccurate data provided by me or distortions of diagnostic images or specimens that may result from electronic transmissions. I understand that the practice of medicine is not an exact science and that Practitioner makes no warranties or guarantees regarding treatment outcomes. I acknowledge that I will receive a copy of this consent concurrently upon execution via email to the email address I last provided but may also request a printed copy by calling the office of [facility].  I understand that my insurance will be billed for this visit.  I have read or had this consent read to me.  I understand the contents of this consent, which adequately explains the benefits and risks of the Services being provided via telemedicine.  I have been provided ample opportunity to ask questions regarding this consent and the Services and have had my questions answered to my satisfaction.  I give my informed consent for the services to be provided through the use of telemedicine in my medical care  By participating in this telemedicine visit I agree to the above YES

## 2018-08-23 NOTE — Progress Notes (Signed)
Virtual Visit via Telephone Note   This visit type was conducted due to national recommendations for restrictions regarding the COVID-19 Pandemic (e.g. social distancing) in an effort to limit this patient's exposure and mitigate transmission in our community.  Due to his co-morbid illnesses, this patient is at least at moderate risk for complications without adequate follow up.  This format is felt to be most appropriate for this patient at this time.  The patient did not have access to video technology/had technical difficulties with video requiring transitioning to audio format only (telephone).  All issues noted in this document were discussed and addressed.  No physical exam could be performed with this format.  Please refer to the patient's chart for his  consent to telehealth for [facility].   Date:  08/24/2018   ID:  Richard Parrish, DOB 1950-03-30, MRN [id number]  Patient Location: Home Provider Location: Home  PCP:  Seward Carol, MD  Cardiologist:  Sinclair Grooms, MD  Electrophysiologist:  None   Evaluation Performed:  Follow-Up Visit  Chief Complaint:  CAD  History of Present Illness:    Richard Parrish is a 68 y.o. male with hx of CAD, prior bypass graft to RCA for failed angioplasty , patent but diffusely diseased SVG to RCA document by catheterization 2012 , severe degenerative disc disease, depression, abdominal aortic aneurysm ,PAD with prior left superficial femoral and popliteal atherectomy in-stent, and hypertension.  No heart complaints even peri-operaative around non cardiac surgery. Occasional DOE with activity bending. No palpitation. Appetite is good. Has lost 11 pounds during COVID 19.  The patient does not have symptoms concerning for COVID-19 infection (fever, chills, cough, or new shortness of breath).    Past Medical History:  Diagnosis Date  . Anxiety   . Arthritis    "lower back" (04/07/2017)  . BPH (benign prostatic hypertrophy)   . C.  difficile diarrhea   . CAD (coronary artery disease)   . Chronic lower back pain   . Coronary artery disease 1989   cabg with dvg to rca   . Depression   . GERD (gastroesophageal reflux disease)   . Hyperlipidemia   . Hypertension   . Insomnia   . Leg pain   . Myocardial infarction ([place]) 2002  . OSA on CPAP    cpap setting of 60 per pt  . Osteoarthritis    "hips" (04/07/2017)  . PAD (peripheral artery disease) ([place])   . Peripheral vascular disease ([place])   . Pneumonia 1989   S/P CABG  . Pulmonary fibrosis ([place])    "one time" (04/07/2017)  . Spinal stenosis    Past Surgical History:  Procedure Laterality Date  . ABDOMINAL AORTAGRAM N/A 03/15/2011   Procedure: ABDOMINAL Maxcine Ham;  Surgeon: Serafina Mitchell, MD;  Location: [facility] CATH LAB;  Service: Cardiovascular;  Laterality: N/A;  . ABDOMINAL AORTAGRAM N/A 04/12/2011   Procedure: ABDOMINAL Maxcine [place];  Surgeon: Serafina Mitchell, MD;  Location: [facility] CATH LAB;  Service: Cardiovascular;  Laterality: N/A;  . BACK SURGERY    . CARDIAC CATHETERIZATION  08/1987  . CORONARY ANGIOPLASTY WITH STENT PLACEMENT  ~ [facility], [place]  . CORONARY ARTERY BYPASS GRAFT  09/1987   "CABG X1";  [facility]; [place]; "RCA"  . JOINT REPLACEMENT    . KNEE ARTHROSCOPY Left   . LAPAROSCOPIC CHOLECYSTECTOMY    . PERIPHERAL VASCULAR INTERVENTION Left 2016   "put a stent; right branch of left femoral"  .  POSTERIOR LUMBAR FUSION  2009   lumbar fusion, S L 5 to L 4  . SHOULDER ARTHROSCOPY W/ ROTATOR CUFF REPAIR Right 2006  . SHOULDER ARTHROSCOPY WITH SUBACROMIAL DECOMPRESSION AND OPEN ROTATOR C Left ~ 1983  . TONSILLECTOMY AND ADENOIDECTOMY    . TOTAL HIP ARTHROPLASTY Right 2012  . TOTAL HIP ARTHROPLASTY Left 04/07/2017  . TOTAL HIP ARTHROPLASTY Left 04/07/2017   Procedure: LEFT TOTAL HIP ARTHROPLASTY;  Surgeon: Earlie Server, MD;  Location: [facility];  Service: Orthopedics;  Laterality: Left;     Current Meds  Medication Sig  .  acetaminophen (TYLENOL) 500 MG tablet Take 500 mg by mouth every 6 (six) hours as needed.  Marland Kitchen aspirin EC 81 MG tablet Take 81 mg by mouth daily.  Marland Kitchen atorvastatin (LIPITOR) 80 MG tablet Take 1 tablet (80 mg total) by mouth daily.  . budesonide (ENTOCORT EC) 3 MG 24 hr capsule Take 9 mg by mouth daily.  . clopidogrel (PLAVIX) 75 MG tablet Take 1 tablet (75 mg total) by mouth daily.  . Glucosamine HCl-MSM (GLUCOSAMINE-MSM PO) Take 1 tablet by mouth every other day.  Marland Kitchen HYDROcodone-acetaminophen (NORCO) 7.5-325 MG tablet Take 1 tablet by mouth 5 (five) times daily.  . hyoscyamine (LEVSIN, ANASPAZ) 0.125 MG tablet Take 0.125 mg by mouth every 4 (four) hours as needed for cramping.   . metoprolol succinate (TOPROL-XL) 50 MG 24 hr tablet TAKE 1 TABLET BY MOUTH EVERY DAY  . Multiple Vitamin (MULTIVITAMIN) tablet Take 1 tablet by mouth every other day.   . Naproxen Sodium (ALEVE PO) Take 1 tablet by mouth 2 (two) times daily.  . nitroGLYCERIN (NITROSTAT) 0.4 MG SL tablet Place 0.4 mg under the tongue every 5 (five) minutes as needed for chest pain.  . Omega-3 Fatty Acids (FISH OIL) 1000 MG CAPS Take 1,000 mg by mouth every other day.   Marland Kitchen omeprazole (PRILOSEC OTC) 20 MG tablet Take 20 mg by mouth daily before breakfast.  . PARoxetine (PAXIL) 40 MG tablet Take 40 mg by mouth daily.  . Probiotic Product (PROBIOTIC PO) Take 1 capsule by mouth daily.  . tamsulosin (FLOMAX) 0.4 MG CAPS capsule Take 1 capsule by mouth every morning.  Marland Kitchen tiZANidine (ZANAFLEX) 2 MG tablet Take 2 mg by mouth every 6 (six) hours as needed. For muscle spasms.  . valsartan (DIOVAN) 320 MG tablet TAKE 1 TABLET BY MOUTH EVERY DAY     Allergies:   Ambien [zolpidem tartrate]   Social History   Tobacco Use  . Smoking status: Former Smoker    Packs/day: 2.00    Years: 15.00    Pack years: 30.00    Types: Cigarettes    Quit date: 03/01/1987    Years since quitting: 31.5  . Smokeless tobacco: Never Used  Substance Use Topics  .  Alcohol use: Yes    Comment: 04/07/2017 'couple drinks/month"  . Drug use: No     Family Hx: The patient's family history includes Cancer in his father; Heart disease in his mother.  ROS:   Please see the history of present illness.    Back pain despite surgery in 2019(Nov). Two surgeries in 2019 -> (Dec. 2019 Hip Feb. '19.) All other systems reviewed and are negative.   Prior CV studies:   The following studies were reviewed today:  None  Labs/Other Tests and Data Reviewed:    EKG:  No ECG reviewed.  Recent Labs: 01/08/2018: ALT 29   Recent Lipid Panel Lab Results  Component Value Date/Time  CHOL 127 01/08/2018 08:21 AM   TRIG 77 01/08/2018 08:21 AM   HDL 43 01/08/2018 08:21 AM   CHOLHDL 3.0 01/08/2018 08:21 AM   CHOLHDL 2.7 09/18/2015 09:59 AM   LDLCALC 69 01/08/2018 08:21 AM    Wt Readings from Last 3 Encounters:  08/24/18 212 lb (96.2 kg)  11/13/17 217 lb (98.4 kg)  03/24/17 228 lb 12.8 oz (103.8 kg)     Objective:    Vital Signs:  BP 136/85   Pulse 65   Ht 6' (1.829 m)   Wt 212 lb (96.2 kg)   BMI 28.75 kg/m    VITAL SIGNS:  reviewed  ASSESSMENT & PLAN:    1. Coronary artery disease of native artery of native heart with stable angina pectoris ([place])   2. Essential hypertension, benign   3. Pure hypercholesterolemia   4. Peripheral vascular disease, unspecified ([place])   5. Educated About Covid-19 Virus Infection    PLAN:  1. Secondary prevention discussed. 2. < 130/80 mmHg. 3. We will investigate current lipid status with blood work prior to the next office visit in 6 months. 4. I encourage walking to help with PAD.  No specific complaints at this time. 5. Social distancing, masking, and handwashing was discussed in detail.  Overall education and awareness concerning primary/secondary risk prevention was discussed in detail: LDL less than 70, hemoglobin A1c less than 7, blood pressure target less than 130/80 mmHg, >150 minutes of moderate aerobic  activity per week, avoidance of smoking, weight control (via diet and exercise), and continued surveillance/management of/for obstructive sleep apnea.   COVID-19 Education: The signs and symptoms of COVID-19 were discussed with the patient and how to seek care for testing (follow up with PCP or arrange E-visit).  The importance of social distancing was discussed today.  Time:   Today, I have spent 10 minutes with the patient with telehealth technology discussing the above problems.     Medication Adjustments/Labs and Tests Ordered: Current medicines are reviewed at length with the patient today.  Concerns regarding medicines are outlined above.   Tests Ordered: No orders of the defined types were placed in this encounter.   Medication Changes: No orders of the defined types were placed in this encounter.   Follow Up:  In Person in 6 month(s)  Signed, Sinclair Grooms, MD  08/24/2018 9:48 AM    [place]

## 2018-08-24 ENCOUNTER — Telehealth (INDEPENDENT_AMBULATORY_CARE_PROVIDER_SITE_OTHER): Payer: Medicare Other | Admitting: Interventional Cardiology

## 2018-08-24 ENCOUNTER — Encounter: Payer: Self-pay | Admitting: *Deleted

## 2018-08-24 ENCOUNTER — Other Ambulatory Visit: Payer: Self-pay

## 2018-08-24 ENCOUNTER — Encounter: Payer: Self-pay | Admitting: Interventional Cardiology

## 2018-08-24 VITALS — BP 136/85 | HR 65 | Ht 72.0 in | Wt 212.0 lb

## 2018-08-24 DIAGNOSIS — I10 Essential (primary) hypertension: Secondary | ICD-10-CM

## 2018-08-24 DIAGNOSIS — I739 Peripheral vascular disease, unspecified: Secondary | ICD-10-CM

## 2018-08-24 DIAGNOSIS — I25118 Atherosclerotic heart disease of native coronary artery with other forms of angina pectoris: Secondary | ICD-10-CM

## 2018-08-24 DIAGNOSIS — Z7189 Other specified counseling: Secondary | ICD-10-CM

## 2018-08-24 DIAGNOSIS — E7800 Pure hypercholesterolemia, unspecified: Secondary | ICD-10-CM

## 2018-08-24 NOTE — Patient Instructions (Signed)
Medication Instructions:   Your physician recommends that you continue on your current medications as directed. Please refer to the Current Medication list given to you today.  If you need a refill on your cardiac medications before your next appointment, please call your pharmacy.    Lab work:  DR Tamala Julian WOULD LIKE FOR YOU TO HAVE LABS A FEW DAYS PRIOR TO YOUR 6 MONTH FOLLOW-UP APPOINTMENT WITH HIM-WE WILL CALL YOU CLOSER TO THIS TIME FRAME TO ARRANGE THIS APPOINTMENT--WE WILL CHECK A CMET, AND LIPIDS--PLEASE COME FASTING TO THIS LAB APPOINTMENT  If you have labs (blood work) drawn today and your tests are completely normal, you will receive your results only by: Marland Kitchen MyChart Message (if you have MyChart) OR . A paper copy in the mail If you have any lab test that is abnormal or we need to change your treatment, we will call you to review the results.    Follow-Up: At [facility], you and your health needs are our priority.  As part of our continuing mission to provide you with exceptional heart care, we have created designated Provider Care Teams.  These Care Teams include your primary Cardiologist (physician) and Advanced Practice Providers (APPs -  Physician Assistants and Nurse Practitioners) who all work together to provide you with the care you need, when you need it.  Your physician wants you to follow-up in: [place] will receive a reminder letter in the mail two months in advance. If you don't receive a letter, please call our office to schedule the follow-up appointment.  YOU WILL NEED TO HAVE YOUR LABS DONE A FEW DAYS PRIOR TO THIS OFFICE VISIT PER DR SMITH.

## 2018-09-01 ENCOUNTER — Other Ambulatory Visit: Payer: Self-pay | Admitting: Vascular Surgery

## 2018-09-07 DIAGNOSIS — M791 Myalgia, unspecified site: Secondary | ICD-10-CM | POA: Diagnosis not present

## 2018-09-07 DIAGNOSIS — M4326 Fusion of spine, lumbar region: Secondary | ICD-10-CM | POA: Diagnosis not present

## 2018-09-07 DIAGNOSIS — G894 Chronic pain syndrome: Secondary | ICD-10-CM | POA: Diagnosis not present

## 2018-09-07 DIAGNOSIS — M7061 Trochanteric bursitis, right hip: Secondary | ICD-10-CM | POA: Diagnosis not present

## 2018-09-11 DIAGNOSIS — E7800 Pure hypercholesterolemia, unspecified: Secondary | ICD-10-CM | POA: Diagnosis not present

## 2018-09-11 DIAGNOSIS — Z125 Encounter for screening for malignant neoplasm of prostate: Secondary | ICD-10-CM | POA: Diagnosis not present

## 2018-09-11 DIAGNOSIS — I739 Peripheral vascular disease, unspecified: Secondary | ICD-10-CM | POA: Diagnosis not present

## 2018-09-11 DIAGNOSIS — Z0000 Encounter for general adult medical examination without abnormal findings: Secondary | ICD-10-CM | POA: Diagnosis not present

## 2018-09-11 DIAGNOSIS — K52832 Lymphocytic colitis: Secondary | ICD-10-CM | POA: Diagnosis not present

## 2018-09-11 DIAGNOSIS — I2510 Atherosclerotic heart disease of native coronary artery without angina pectoris: Secondary | ICD-10-CM | POA: Diagnosis not present

## 2018-09-11 DIAGNOSIS — F329 Major depressive disorder, single episode, unspecified: Secondary | ICD-10-CM | POA: Diagnosis not present

## 2018-09-11 DIAGNOSIS — I10 Essential (primary) hypertension: Secondary | ICD-10-CM | POA: Diagnosis not present

## 2018-09-12 DIAGNOSIS — M545 Low back pain: Secondary | ICD-10-CM | POA: Diagnosis not present

## 2018-09-12 DIAGNOSIS — I10 Essential (primary) hypertension: Secondary | ICD-10-CM | POA: Diagnosis not present

## 2018-09-12 DIAGNOSIS — E7800 Pure hypercholesterolemia, unspecified: Secondary | ICD-10-CM | POA: Diagnosis not present

## 2018-09-12 DIAGNOSIS — I739 Peripheral vascular disease, unspecified: Secondary | ICD-10-CM | POA: Diagnosis not present

## 2018-09-12 DIAGNOSIS — I2510 Atherosclerotic heart disease of native coronary artery without angina pectoris: Secondary | ICD-10-CM | POA: Diagnosis not present

## 2018-09-12 DIAGNOSIS — Z125 Encounter for screening for malignant neoplasm of prostate: Secondary | ICD-10-CM | POA: Diagnosis not present

## 2018-09-12 DIAGNOSIS — M4726 Other spondylosis with radiculopathy, lumbar region: Secondary | ICD-10-CM | POA: Diagnosis not present

## 2018-09-17 DIAGNOSIS — M545 Low back pain: Secondary | ICD-10-CM | POA: Diagnosis not present

## 2018-09-17 DIAGNOSIS — M4726 Other spondylosis with radiculopathy, lumbar region: Secondary | ICD-10-CM | POA: Diagnosis not present

## 2018-09-19 DIAGNOSIS — M545 Low back pain: Secondary | ICD-10-CM | POA: Diagnosis not present

## 2018-09-19 DIAGNOSIS — M4726 Other spondylosis with radiculopathy, lumbar region: Secondary | ICD-10-CM | POA: Diagnosis not present

## 2018-09-25 DIAGNOSIS — M4726 Other spondylosis with radiculopathy, lumbar region: Secondary | ICD-10-CM | POA: Diagnosis not present

## 2018-09-25 DIAGNOSIS — M545 Low back pain: Secondary | ICD-10-CM | POA: Diagnosis not present

## 2018-09-27 DIAGNOSIS — M545 Low back pain: Secondary | ICD-10-CM | POA: Diagnosis not present

## 2018-09-27 DIAGNOSIS — M4726 Other spondylosis with radiculopathy, lumbar region: Secondary | ICD-10-CM | POA: Diagnosis not present

## 2018-10-02 DIAGNOSIS — M545 Low back pain: Secondary | ICD-10-CM | POA: Diagnosis not present

## 2018-10-02 DIAGNOSIS — M4726 Other spondylosis with radiculopathy, lumbar region: Secondary | ICD-10-CM | POA: Diagnosis not present

## 2018-10-04 DIAGNOSIS — M545 Low back pain: Secondary | ICD-10-CM | POA: Diagnosis not present

## 2018-10-04 DIAGNOSIS — M4726 Other spondylosis with radiculopathy, lumbar region: Secondary | ICD-10-CM | POA: Diagnosis not present

## 2018-10-16 DIAGNOSIS — M4726 Other spondylosis with radiculopathy, lumbar region: Secondary | ICD-10-CM | POA: Diagnosis not present

## 2018-10-16 DIAGNOSIS — M545 Low back pain: Secondary | ICD-10-CM | POA: Diagnosis not present

## 2018-10-18 DIAGNOSIS — M4726 Other spondylosis with radiculopathy, lumbar region: Secondary | ICD-10-CM | POA: Diagnosis not present

## 2018-10-18 DIAGNOSIS — M545 Low back pain: Secondary | ICD-10-CM | POA: Diagnosis not present

## 2018-10-23 DIAGNOSIS — M4326 Fusion of spine, lumbar region: Secondary | ICD-10-CM | POA: Diagnosis not present

## 2018-10-23 DIAGNOSIS — M4726 Other spondylosis with radiculopathy, lumbar region: Secondary | ICD-10-CM | POA: Diagnosis not present

## 2018-10-23 DIAGNOSIS — M791 Myalgia, unspecified site: Secondary | ICD-10-CM | POA: Diagnosis not present

## 2018-10-23 DIAGNOSIS — M545 Low back pain: Secondary | ICD-10-CM | POA: Diagnosis not present

## 2018-10-23 DIAGNOSIS — G894 Chronic pain syndrome: Secondary | ICD-10-CM | POA: Diagnosis not present

## 2018-10-25 DIAGNOSIS — M545 Low back pain: Secondary | ICD-10-CM | POA: Diagnosis not present

## 2018-10-25 DIAGNOSIS — M4726 Other spondylosis with radiculopathy, lumbar region: Secondary | ICD-10-CM | POA: Diagnosis not present

## 2018-10-29 DIAGNOSIS — M545 Low back pain: Secondary | ICD-10-CM | POA: Diagnosis not present

## 2018-10-29 DIAGNOSIS — M4726 Other spondylosis with radiculopathy, lumbar region: Secondary | ICD-10-CM | POA: Diagnosis not present

## 2018-10-31 DIAGNOSIS — Z23 Encounter for immunization: Secondary | ICD-10-CM | POA: Diagnosis not present

## 2018-10-31 DIAGNOSIS — M545 Low back pain: Secondary | ICD-10-CM | POA: Diagnosis not present

## 2018-10-31 DIAGNOSIS — M4726 Other spondylosis with radiculopathy, lumbar region: Secondary | ICD-10-CM | POA: Diagnosis not present

## 2018-11-06 DIAGNOSIS — M4726 Other spondylosis with radiculopathy, lumbar region: Secondary | ICD-10-CM | POA: Diagnosis not present

## 2018-11-06 DIAGNOSIS — M545 Low back pain: Secondary | ICD-10-CM | POA: Diagnosis not present

## 2018-11-08 DIAGNOSIS — M4726 Other spondylosis with radiculopathy, lumbar region: Secondary | ICD-10-CM | POA: Diagnosis not present

## 2018-11-08 DIAGNOSIS — M545 Low back pain: Secondary | ICD-10-CM | POA: Diagnosis not present

## 2018-11-15 DIAGNOSIS — M545 Low back pain: Secondary | ICD-10-CM | POA: Diagnosis not present

## 2018-11-15 DIAGNOSIS — M4726 Other spondylosis with radiculopathy, lumbar region: Secondary | ICD-10-CM | POA: Diagnosis not present

## 2018-11-20 DIAGNOSIS — G894 Chronic pain syndrome: Secondary | ICD-10-CM | POA: Diagnosis not present

## 2018-11-20 DIAGNOSIS — M4326 Fusion of spine, lumbar region: Secondary | ICD-10-CM | POA: Diagnosis not present

## 2018-11-20 DIAGNOSIS — Z79899 Other long term (current) drug therapy: Secondary | ICD-10-CM | POA: Diagnosis not present

## 2018-11-20 DIAGNOSIS — Z79891 Long term (current) use of opiate analgesic: Secondary | ICD-10-CM | POA: Diagnosis not present

## 2018-11-20 DIAGNOSIS — M545 Low back pain: Secondary | ICD-10-CM | POA: Diagnosis not present

## 2018-11-20 DIAGNOSIS — M791 Myalgia, unspecified site: Secondary | ICD-10-CM | POA: Diagnosis not present

## 2018-11-22 DIAGNOSIS — M545 Low back pain: Secondary | ICD-10-CM | POA: Diagnosis not present

## 2018-11-22 DIAGNOSIS — M4726 Other spondylosis with radiculopathy, lumbar region: Secondary | ICD-10-CM | POA: Diagnosis not present

## 2018-11-24 DIAGNOSIS — I2510 Atherosclerotic heart disease of native coronary artery without angina pectoris: Secondary | ICD-10-CM | POA: Diagnosis not present

## 2018-11-24 DIAGNOSIS — E782 Mixed hyperlipidemia: Secondary | ICD-10-CM | POA: Diagnosis not present

## 2018-11-24 DIAGNOSIS — E7800 Pure hypercholesterolemia, unspecified: Secondary | ICD-10-CM | POA: Diagnosis not present

## 2018-11-24 DIAGNOSIS — F329 Major depressive disorder, single episode, unspecified: Secondary | ICD-10-CM | POA: Diagnosis not present

## 2018-11-24 DIAGNOSIS — I10 Essential (primary) hypertension: Secondary | ICD-10-CM | POA: Diagnosis not present

## 2018-11-24 DIAGNOSIS — F324 Major depressive disorder, single episode, in partial remission: Secondary | ICD-10-CM | POA: Diagnosis not present

## 2018-11-28 DIAGNOSIS — M4726 Other spondylosis with radiculopathy, lumbar region: Secondary | ICD-10-CM | POA: Diagnosis not present

## 2018-11-28 DIAGNOSIS — M545 Low back pain: Secondary | ICD-10-CM | POA: Diagnosis not present

## 2018-11-29 ENCOUNTER — Other Ambulatory Visit: Payer: Self-pay | Admitting: Interventional Cardiology

## 2018-12-05 DIAGNOSIS — M4726 Other spondylosis with radiculopathy, lumbar region: Secondary | ICD-10-CM | POA: Diagnosis not present

## 2018-12-05 DIAGNOSIS — M545 Low back pain: Secondary | ICD-10-CM | POA: Diagnosis not present

## 2018-12-18 DIAGNOSIS — G894 Chronic pain syndrome: Secondary | ICD-10-CM | POA: Diagnosis not present

## 2018-12-18 DIAGNOSIS — M4326 Fusion of spine, lumbar region: Secondary | ICD-10-CM | POA: Diagnosis not present

## 2018-12-18 DIAGNOSIS — M545 Low back pain: Secondary | ICD-10-CM | POA: Diagnosis not present

## 2018-12-18 DIAGNOSIS — M791 Myalgia, unspecified site: Secondary | ICD-10-CM | POA: Diagnosis not present

## 2018-12-28 ENCOUNTER — Other Ambulatory Visit: Payer: Self-pay | Admitting: Interventional Cardiology

## 2018-12-28 DIAGNOSIS — F329 Major depressive disorder, single episode, unspecified: Secondary | ICD-10-CM | POA: Diagnosis not present

## 2018-12-28 DIAGNOSIS — E7800 Pure hypercholesterolemia, unspecified: Secondary | ICD-10-CM | POA: Diagnosis not present

## 2018-12-28 DIAGNOSIS — I2510 Atherosclerotic heart disease of native coronary artery without angina pectoris: Secondary | ICD-10-CM | POA: Diagnosis not present

## 2018-12-28 DIAGNOSIS — I10 Essential (primary) hypertension: Secondary | ICD-10-CM | POA: Diagnosis not present

## 2018-12-28 DIAGNOSIS — E782 Mixed hyperlipidemia: Secondary | ICD-10-CM | POA: Diagnosis not present

## 2018-12-28 DIAGNOSIS — F324 Major depressive disorder, single episode, in partial remission: Secondary | ICD-10-CM | POA: Diagnosis not present

## 2018-12-28 NOTE — Telephone Encounter (Signed)
° ° ° ° °*  STAT* If patient is at the pharmacy, call can be transferred to refill team.   1. Which medications need to be refilled? (please list name of each medication and dose if known)  metoprolol succinate (TOPROL-XL) 50 MG 24 hr tablet valsartan (DIOVAN) 320 MG tablet atorvastatin (LIPITOR) 80 MG tablet(Expired)  2. Which pharmacy/location (including street and city if local pharmacy) is medication to be sent to? [facility]  3. Do they need a 30 day or 90 day supply? [place]

## 2018-12-31 MED ORDER — VALSARTAN 320 MG PO TABS: 320.0000 mg | ORAL_TABLET | Freq: Every day | ORAL | 1 refills | Status: DC

## 2018-12-31 MED ORDER — METOPROLOL SUCCINATE ER 50 MG PO TB24: 50.0000 mg | ORAL_TABLET | Freq: Every day | ORAL | 1 refills | Status: DC

## 2018-12-31 MED ORDER — ATORVASTATIN CALCIUM 80 MG PO TABS: 80.0000 mg | ORAL_TABLET | Freq: Every day | ORAL | 1 refills | Status: DC

## 2018-12-31 NOTE — Telephone Encounter (Signed)
Pt's medication was sent to pt's pharmacy as requested. Confirmation received.  °

## 2019-01-22 DIAGNOSIS — G894 Chronic pain syndrome: Secondary | ICD-10-CM | POA: Diagnosis not present

## 2019-01-22 DIAGNOSIS — M791 Myalgia, unspecified site: Secondary | ICD-10-CM | POA: Diagnosis not present

## 2019-01-22 DIAGNOSIS — M4326 Fusion of spine, lumbar region: Secondary | ICD-10-CM | POA: Diagnosis not present

## 2019-01-22 DIAGNOSIS — M545 Low back pain: Secondary | ICD-10-CM | POA: Diagnosis not present

## 2019-01-23 DIAGNOSIS — E7800 Pure hypercholesterolemia, unspecified: Secondary | ICD-10-CM | POA: Diagnosis not present

## 2019-01-23 DIAGNOSIS — F324 Major depressive disorder, single episode, in partial remission: Secondary | ICD-10-CM | POA: Diagnosis not present

## 2019-01-23 DIAGNOSIS — E782 Mixed hyperlipidemia: Secondary | ICD-10-CM | POA: Diagnosis not present

## 2019-01-23 DIAGNOSIS — F329 Major depressive disorder, single episode, unspecified: Secondary | ICD-10-CM | POA: Diagnosis not present

## 2019-01-23 DIAGNOSIS — I2510 Atherosclerotic heart disease of native coronary artery without angina pectoris: Secondary | ICD-10-CM | POA: Diagnosis not present

## 2019-01-23 DIAGNOSIS — I10 Essential (primary) hypertension: Secondary | ICD-10-CM | POA: Diagnosis not present

## 2019-02-12 ENCOUNTER — Other Ambulatory Visit: Payer: Self-pay | Admitting: Interventional Cardiology

## 2019-02-13 ENCOUNTER — Ambulatory Visit: Payer: Medicare Other

## 2019-02-15 ENCOUNTER — Other Ambulatory Visit: Payer: Medicare Other | Admitting: *Deleted

## 2019-02-15 ENCOUNTER — Other Ambulatory Visit: Payer: Self-pay

## 2019-02-15 DIAGNOSIS — I10 Essential (primary) hypertension: Secondary | ICD-10-CM

## 2019-02-15 DIAGNOSIS — I739 Peripheral vascular disease, unspecified: Secondary | ICD-10-CM

## 2019-02-15 DIAGNOSIS — E7800 Pure hypercholesterolemia, unspecified: Secondary | ICD-10-CM

## 2019-02-15 DIAGNOSIS — I25118 Atherosclerotic heart disease of native coronary artery with other forms of angina pectoris: Secondary | ICD-10-CM

## 2019-02-15 DIAGNOSIS — Z7189 Other specified counseling: Secondary | ICD-10-CM

## 2019-02-15 LAB — COMPREHENSIVE METABOLIC PANEL
ALT: 29 IU/L (ref 0–44)
AST: 27 IU/L (ref 0–40)
Albumin/Globulin Ratio: 2.4 — ABNORMAL HIGH (ref 1.2–2.2)
Albumin: 4.6 g/dL (ref 3.8–4.8)
Alkaline Phosphatase: 74 IU/L (ref 39–117)
BUN/Creatinine Ratio: 20 (ref 10–24)
BUN: 21 mg/dL (ref 8–27)
Bilirubin Total: 0.6 mg/dL (ref 0.0–1.2)
CO2: 25 mmol/L (ref 20–29)
Calcium: 9.5 mg/dL (ref 8.6–10.2)
Chloride: 99 mmol/L (ref 96–106)
Creatinine, Ser: 1.05 mg/dL (ref 0.76–1.27)
GFR calc Af Amer: 83 mL/min/{1.73_m2} (ref >59)
GFR calc non Af Amer: 72 mL/min/{1.73_m2} (ref >59)
Globulin, Total: 1.9 g/dL (ref 1.5–4.5)
Glucose: 102 mg/dL — ABNORMAL HIGH (ref 65–99)
Potassium: 4.5 mmol/L (ref 3.5–5.2)
Sodium: 138 mmol/L (ref 134–144)
Total Protein: 6.5 g/dL (ref 6.0–8.5)

## 2019-02-15 LAB — LIPID PANEL
Chol/HDL Ratio: 2.7 ratio (ref 0.0–5.0)
Cholesterol, Total: 149 mg/dL (ref 100–199)
HDL: 55 mg/dL (ref >39)
LDL Chol Calc (NIH): 78 mg/dL (ref 0–99)
Triglycerides: 86 mg/dL (ref 0–149)
VLDL Cholesterol Cal: 16 mg/dL (ref 5–40)

## 2019-02-19 NOTE — Progress Notes (Signed)
Cardiology Office Note:    Date:  02/20/2019   ID:  Richard Parrish, DOB [birth year], MRN [id number]  PCP:  Seward Carol, MD  Cardiologist:  Sinclair Grooms, MD   Referring MD: Seward Carol, MD   Chief Complaint  Patient presents with  . Coronary Artery Disease    History of Present Illness:    Richard Parrish is a 68 y.o. male with a hx of CAD, prior bypass graft to RCA for failed angioplasty , patent but diffusely diseased SVG to RCA document by catheterization 2012 , severe degenerative disc disease, depression, abdominal aortic aneurysm ,PAD with prior left superficial femoral and popliteal atherectomy in-stent, and hypertension.  He has not had angina or excessive shortness of breath.  He does get bilateral buttocks burning and discomfort with walking.  This goes away with rest.  No calf or lower extremity discomfort.  No medication side effects.  He has not had transient neurological problems, lower extremity swelling, orthopnea, or PND.  Past Medical History:  Diagnosis Date  . Anxiety   . Arthritis    "lower back" (04/07/2017)  . BPH (benign prostatic hypertrophy)   . C. difficile diarrhea   . CAD (coronary artery disease)   . Chronic lower back pain   . Coronary artery disease 1989   cabg with dvg to rca   . Depression   . GERD (gastroesophageal reflux disease)   . Hyperlipidemia   . Hypertension   . Insomnia   . Leg pain   . Myocardial infarction ([place]) 2002  . OSA on CPAP    cpap setting of 60 per pt  . Osteoarthritis    "hips" (04/07/2017)  . PAD (peripheral artery disease) ([place])   . Peripheral vascular disease ([place])   . Pneumonia 1989   S/P CABG  . Pulmonary fibrosis ([place])    "one time" (04/07/2017)  . Spinal stenosis     Past Surgical History:  Procedure Laterality Date  . ABDOMINAL AORTAGRAM N/A 03/15/2011   Procedure: ABDOMINAL Maxcine Ham;  Surgeon: Serafina Mitchell, MD;  Location: [facility] CATH LAB;  Service: Cardiovascular;  Laterality: N/A;  .  ABDOMINAL AORTAGRAM N/A 04/12/2011   Procedure: ABDOMINAL Maxcine Ham;  Surgeon: Serafina Mitchell, MD;  Location: [facility] CATH LAB;  Service: Cardiovascular;  Laterality: N/A;  . BACK SURGERY    . CARDIAC CATHETERIZATION  08/1987  . CORONARY ANGIOPLASTY WITH STENT PLACEMENT  ~ [facility], [place]  . CORONARY ARTERY BYPASS GRAFT  09/1987   "CABG X1";  [facility]; [place]; "RCA"  . JOINT REPLACEMENT    . KNEE ARTHROSCOPY Left   . LAPAROSCOPIC CHOLECYSTECTOMY    . PERIPHERAL VASCULAR INTERVENTION Left 2016   "put a stent; right branch of left femoral"  . POSTERIOR LUMBAR FUSION  2009   lumbar fusion, S L 5 to L 4  . SHOULDER ARTHROSCOPY W/ ROTATOR CUFF REPAIR Right 2006  . SHOULDER ARTHROSCOPY WITH SUBACROMIAL DECOMPRESSION AND OPEN ROTATOR C Left ~ 1983  . TONSILLECTOMY AND ADENOIDECTOMY    . TOTAL HIP ARTHROPLASTY Right 2012  . TOTAL HIP ARTHROPLASTY Left 04/07/2017  . TOTAL HIP ARTHROPLASTY Left 04/07/2017   Procedure: LEFT TOTAL HIP ARTHROPLASTY;  Surgeon: Earlie Server, MD;  Location: [facility];  Service: Orthopedics;  Laterality: Left;    Current Medications: Current Meds  Medication Sig  . acetaminophen (TYLENOL) 500 MG tablet Take 500 mg by mouth every 6 (six) hours as needed.  Marland Kitchen aspirin EC 81  MG tablet Take 81 mg by mouth daily.  Marland Kitchen atorvastatin (LIPITOR) 80 MG tablet TAKE 1 TABLET BY MOUTH EVERY DAY  . budesonide (ENTOCORT EC) 3 MG 24 hr capsule Take 9 mg by mouth daily.  . clopidogrel (PLAVIX) 75 MG tablet TAKE 1 TABLET BY MOUTH DAILY  . Glucosamine HCl-MSM (GLUCOSAMINE-MSM PO) Take 1 tablet by mouth every other day.  Marland Kitchen HYDROcodone-acetaminophen (NORCO/VICODIN) 5-325 MG tablet Take 1 tablet by mouth 5 (five) times daily.   . hyoscyamine (LEVSIN, ANASPAZ) 0.125 MG tablet Take 0.125 mg by mouth every 4 (four) hours as needed for cramping.   . metoprolol succinate (TOPROL-XL) 50 MG 24 hr tablet Take 1 tablet (50 mg total) by mouth daily. Take with or immediately  following a meal.  . Multiple Vitamin (MULTIVITAMIN) tablet Take 1 tablet by mouth every other day.   . nitroGLYCERIN (NITROSTAT) 0.4 MG SL tablet Place 0.4 mg under the tongue every 5 (five) minutes as needed for chest pain.  . Omega-3 Fatty Acids (FISH OIL) 1000 MG CAPS Take 1,000 mg by mouth every other day.   Marland Kitchen omeprazole (PRILOSEC OTC) 20 MG tablet Take 20 mg by mouth daily before breakfast.  . PARoxetine (PAXIL) 40 MG tablet Take 40 mg by mouth daily.  . Probiotic Product (PROBIOTIC PO) Take 1 capsule by mouth daily.  . tamsulosin (FLOMAX) 0.4 MG CAPS capsule Take 1 capsule by mouth every morning.  Marland Kitchen tiZANidine (ZANAFLEX) 2 MG tablet Take 2 mg by mouth every 6 (six) hours as needed. For muscle spasms.  . traZODone (DESYREL) 50 MG tablet Take 50 mg by mouth at bedtime.  . valsartan (DIOVAN) 320 MG tablet Take 1 tablet (320 mg total) by mouth daily.     Allergies:   Ambien [zolpidem tartrate]   Social History   Socioeconomic History  . Marital status: Married    Spouse name: Not on file  . Number of children: Not on file  . Years of education: Not on file  . Highest education level: Not on file  Occupational History  . Occupation: Retired  Tobacco Use  . Smoking status: Former Smoker    Packs/day: 2.00    Years: 15.00    Pack years: 30.00    Types: Cigarettes    Quit date: 03/01/1987    Years since quitting: 31.9  . Smokeless tobacco: Never Used  Substance and Sexual Activity  . Alcohol use: Yes    Comment: 04/07/2017 'couple drinks/month"  . Drug use: No  . Sexual activity: Not Currently  Other Topics Concern  . Not on file  Social History Narrative   ** Merged History Encounter **       Social Determinants of Health   Financial Resource Strain:   . Difficulty of Paying Living Expenses: Not on file  Food Insecurity:   . Worried About Charity fundraiser in the Last Year: Not on file  . Ran Out of Food in the Last Year: Not on file  Transportation Needs:   . Lack  of Transportation (Medical): Not on file  . Lack of Transportation (Non-Medical): Not on file  Physical Activity:   . Days of Exercise per Week: Not on file  . Minutes of Exercise per Session: Not on file  Stress:   . Feeling of Stress : Not on file  Social Connections:   . Frequency of Communication with Friends and Family: Not on file  . Frequency of Social Gatherings with Friends and Family: Not on file  .  Attends Religious Services: Not on file  . Active Member of Clubs or Organizations: Not on file  . Attends Archivist Meetings: Not on file  . Marital Status: Not on file     Family History: The patient's family history includes Cancer in his father; Heart disease in his mother.  ROS:   Please see the history of present illness.    Upper respiratory and nasal congestion.  Chronic dry hacking cough.  All other systems reviewed and are negative.  EKGs/Labs/Other Studies Reviewed:    The following studies were reviewed today: No new functional data.  Last lower extremity Doppler study 2015.  EKG:  EKG normal sinus rhythm, horizontal axis, first-degree AV block.  No significant change when compared to prior.  Recent Labs: 02/15/2019: ALT 29; BUN 21; Creatinine, Ser 1.05; Potassium 4.5; Sodium 138  Recent Lipid Panel    Component Value Date/Time   CHOL 149 02/15/2019 0800   TRIG 86 02/15/2019 0800   HDL 55 02/15/2019 0800   CHOLHDL 2.7 02/15/2019 0800   CHOLHDL 2.7 09/18/2015 0959   VLDL 10 09/18/2015 0959   LDLCALC 78 02/15/2019 0800    Physical Exam:    VS:  BP (!) 152/92   Pulse 60   Ht 6' (1.829 m)   Wt 227 lb (103 kg)   BMI 30.79 kg/m     Wt Readings from Last 3 Encounters:  02/20/19 227 lb (103 kg)  08/24/18 212 lb (96.2 kg)  11/13/17 217 lb (98.4 kg)    He has not had any medication yet this morning. GEN: Appears older than stated age. No acute distress HEENT: Normal NECK: No JVD. LYMPHATICS: No lymphadenopathy CARDIAC:  RRR without  murmur, gallop, or edema. VASCULAR: Decreased pulses in feet.  Normal radial pulses. No bruits. RESPIRATORY:  Clear to auscultation without rales, wheezing or rhonchi  ABDOMEN: Soft, non-tender, non-distended, No pulsatile mass, MUSCULOSKELETAL: No deformity  SKIN: Warm and dry NEUROLOGIC:  Alert and oriented x 3 PSYCHIATRIC:  Normal affect   ASSESSMENT:    1. Coronary artery disease of native artery of native heart with stable angina pectoris ([place])   2. Claudication ([place])   3. Essential hypertension, benign   4. Pure hypercholesterolemia   5. Peripheral vascular disease, unspecified ([place])   6. Educated about COVID-19 virus infection    PLAN:    In order of problems listed above:  1. Secondary prevention discussed in detail. 2. Bilateral lower extremity arterial Doppler study to rule out Leriche syndrome 3. Blood pressure is elevated today.  Has not had his medication yet this morning. 4. Most recent LDL in July was below target. 5. See above 6. 3W's discussed in detail to avoid COVID-19 infection.  Overall education and awareness concerning primary/secondary risk prevention was discussed in detail: LDL less than 70, hemoglobin A1c less than 7, blood pressure target less than 130/80 mmHg, >150 minutes of moderate aerobic activity per week, avoidance of smoking, weight control (via diet and exercise), and continued surveillance/management of/for obstructive sleep apnea.    Medication Adjustments/Labs and Tests Ordered: Current medicines are reviewed at length with the patient today.  Concerns regarding medicines are outlined above.  Orders Placed This Encounter  Procedures  . EKG 12-Lead  . VAS [place] LOWER EXTREMITY ARTERIAL DUPLEX   No orders of the defined types were placed in this encounter.   Patient Instructions  Medication Instructions:  Your physician recommends that you continue on your current medications as directed. Please refer to  the Current Medication list given  to you today.\  *If you need a refill on your cardiac medications before your next appointment, please call your pharmacy*  Lab Work: Nond If you have labs (blood work) drawn today and your tests are completely normal, you will receive your results only by: Marland Kitchen MyChart Message (if you have MyChart) OR . A paper copy in the mail If you have any lab test that is abnormal or we need to change your treatment, we will call you to review the results.  Testing/Procedures: Your physician has requested that you have a lower or upper extremity arterial duplex. This test is an ultrasound of the arteries in the legs or arms. It looks at arterial blood flow in the legs and arms. Allow one hour for Lower and Upper Arterial scans. There are no restrictions or special instructions   Follow-Up: At [facility], you and your health needs are our priority.  As part of our continuing mission to provide you with exceptional heart care, we have created designated Provider Care Teams.  These Care Teams include your primary Cardiologist (physician) and Advanced Practice Providers (APPs -  Physician Assistants and Nurse Practitioners) who all work together to provide you with the care you need, when you need it.  Your next appointment:   12 month(s)  The format for your next appointment:   In Person  Provider:   You may see Sinclair Grooms, MD or one of the following Advanced Practice Providers on your designated Care Team:    Truitt Merle, NP  Cecilie Kicks, NP  Kathyrn Drown, NP   Other Instructions      Signed, Sinclair Grooms, MD  02/20/2019 9:46 AM    [place]

## 2019-02-20 ENCOUNTER — Other Ambulatory Visit: Payer: Self-pay

## 2019-02-20 ENCOUNTER — Encounter: Payer: Self-pay | Admitting: Interventional Cardiology

## 2019-02-20 ENCOUNTER — Ambulatory Visit (INDEPENDENT_AMBULATORY_CARE_PROVIDER_SITE_OTHER): Payer: Medicare Other | Admitting: Interventional Cardiology

## 2019-02-20 VITALS — BP 152/92 | HR 60 | Ht 72.0 in | Wt 227.0 lb

## 2019-02-20 DIAGNOSIS — I25118 Atherosclerotic heart disease of native coronary artery with other forms of angina pectoris: Secondary | ICD-10-CM | POA: Diagnosis not present

## 2019-02-20 DIAGNOSIS — I739 Peripheral vascular disease, unspecified: Secondary | ICD-10-CM | POA: Diagnosis not present

## 2019-02-20 DIAGNOSIS — Z7189 Other specified counseling: Secondary | ICD-10-CM

## 2019-02-20 DIAGNOSIS — I10 Essential (primary) hypertension: Secondary | ICD-10-CM | POA: Diagnosis not present

## 2019-02-20 DIAGNOSIS — E7800 Pure hypercholesterolemia, unspecified: Secondary | ICD-10-CM

## 2019-02-20 NOTE — Telephone Encounter (Signed)
error 

## 2019-02-20 NOTE — Patient Instructions (Signed)
Medication Instructions:  Your physician recommends that you continue on your current medications as directed. Please refer to the Current Medication list given to you today.\  *If you need a refill on your cardiac medications before your next appointment, please call your pharmacy*  Lab Work: Nond If you have labs (blood work) drawn today and your tests are completely normal, you will receive your results only by: Marland Kitchen MyChart Message (if you have MyChart) OR . A paper copy in the mail If you have any lab test that is abnormal or we need to change your treatment, we will call you to review the results.  Testing/Procedures: Your physician has requested that you have a lower or upper extremity arterial duplex. This test is an ultrasound of the arteries in the legs or arms. It looks at arterial blood flow in the legs and arms. Allow one hour for Lower and Upper Arterial scans. There are no restrictions or special instructions   Follow-Up: At [facility], you and your health needs are our priority.  As part of our continuing mission to provide you with exceptional heart care, we have created designated Provider Care Teams.  These Care Teams include your primary Cardiologist (physician) and Advanced Practice Providers (APPs -  Physician Assistants and Nurse Practitioners) who all work together to provide you with the care you need, when you need it.  Your next appointment:   12 month(s)  The format for your next appointment:   In Person  Provider:   You may see Sinclair Grooms, MD or one of the following Advanced Practice Providers on your designated Care Team:    Truitt Merle, NP  Cecilie Kicks, NP  Kathyrn Drown, NP   Other Instructions

## 2019-02-26 ENCOUNTER — Other Ambulatory Visit (HOSPITAL_COMMUNITY): Payer: Self-pay | Admitting: Interventional Cardiology

## 2019-02-26 DIAGNOSIS — I739 Peripheral vascular disease, unspecified: Secondary | ICD-10-CM

## 2019-03-06 ENCOUNTER — Other Ambulatory Visit: Payer: Self-pay

## 2019-03-06 ENCOUNTER — Ambulatory Visit (HOSPITAL_COMMUNITY)
Admission: RE | Admit: 2019-03-06 | Discharge: 2019-03-06 | Disposition: A | Discharge status: Home / Self Care | Payer: Medicare Other | Source: Ambulatory Visit | Attending: Cardiology | Admitting: Cardiology

## 2019-03-06 DIAGNOSIS — I739 Peripheral vascular disease, unspecified: Secondary | ICD-10-CM | POA: Diagnosis present

## 2019-03-21 ENCOUNTER — Ambulatory Visit: Payer: Medicare Other | Attending: Internal Medicine

## 2019-03-21 DIAGNOSIS — Z23 Encounter for immunization: Secondary | ICD-10-CM | POA: Insufficient documentation

## 2019-03-21 NOTE — Progress Notes (Signed)
   Covid-19 Vaccination Clinic  Name:  Richard Parrish    MRN: [id number] DOB: August 03, 1950  03/21/2019  Mr. Costabile was observed post Covid-19 immunization for 30 minutes based on pre-vaccination screening without incidence. He was provided with Vaccine Information Sheet and instruction to access the V-Safe system.   Mr. Quintela was instructed to call 911 with any severe reactions post vaccine: Marland Kitchen Difficulty breathing  . Swelling of your face and throat  . A fast heartbeat  . A bad rash all over your body  . Dizziness and weakness    Immunizations Administered    Name Date Dose VIS Date Route   Pfizer COVID-19 Vaccine 03/21/2019  9:30 AM 0.3 mL 02/08/2019 Intramuscular   Manufacturer: [place]   Lot: [id number]   [place]: [address]-5

## 2019-04-10 ENCOUNTER — Ambulatory Visit: Payer: Medicare Other | Attending: Internal Medicine

## 2019-04-10 DIAGNOSIS — Z23 Encounter for immunization: Secondary | ICD-10-CM | POA: Insufficient documentation

## 2019-04-10 NOTE — Progress Notes (Signed)
   Covid-19 Vaccination Clinic  Name:  DAMARION MENDIZABAL    MRN: [id number] DOB: 1950/06/03  04/10/2019  Mr. Carton was observed post Covid-19 immunization for 15 minutes without incidence. He was provided with Vaccine Information Sheet and instruction to access the V-Safe system.   Mr. Ketcham was instructed to call 911 with any severe reactions post vaccine: Marland Kitchen Difficulty breathing  . Swelling of your face and throat  . A fast heartbeat  . A bad rash all over your body  . Dizziness and weakness    Immunizations Administered    Name Date Dose VIS Date Route   Pfizer COVID-19 Vaccine 04/10/2019  2:31 PM 0.3 mL 02/08/2019 Intramuscular   Manufacturer: [place]   Lot: [id number]   [place]: [id number]

## 2019-04-14 IMAGING — CR DG CHEST 2V
2 series · 2 of 2 positions shown · non-contrast
Comparison: Chest radiograph 07/15/2013

CLINICAL DATA: Patient with total left hip arthroplasty.

EXAM:
CHEST  2 VIEW

[w chest pa]
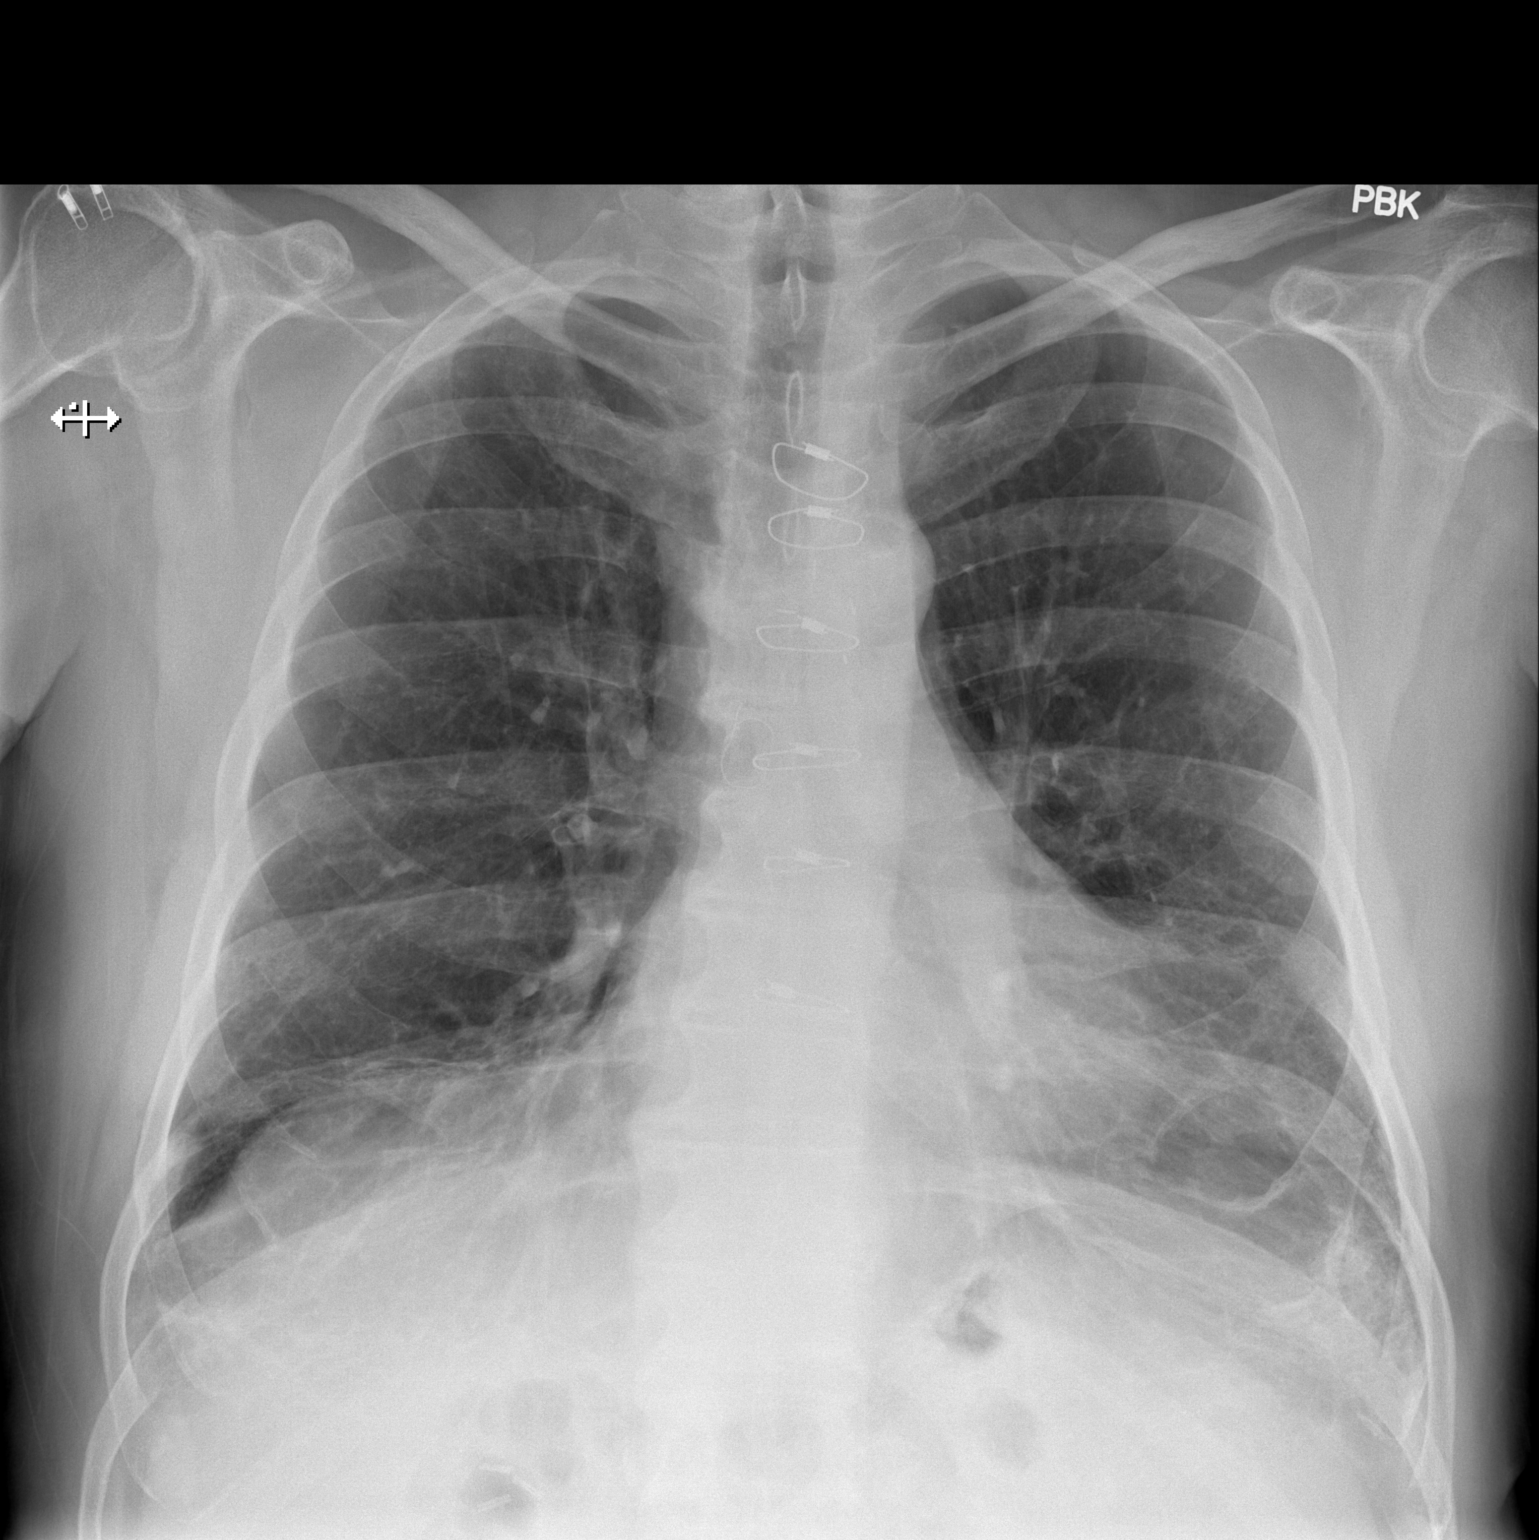

[w chest lat]
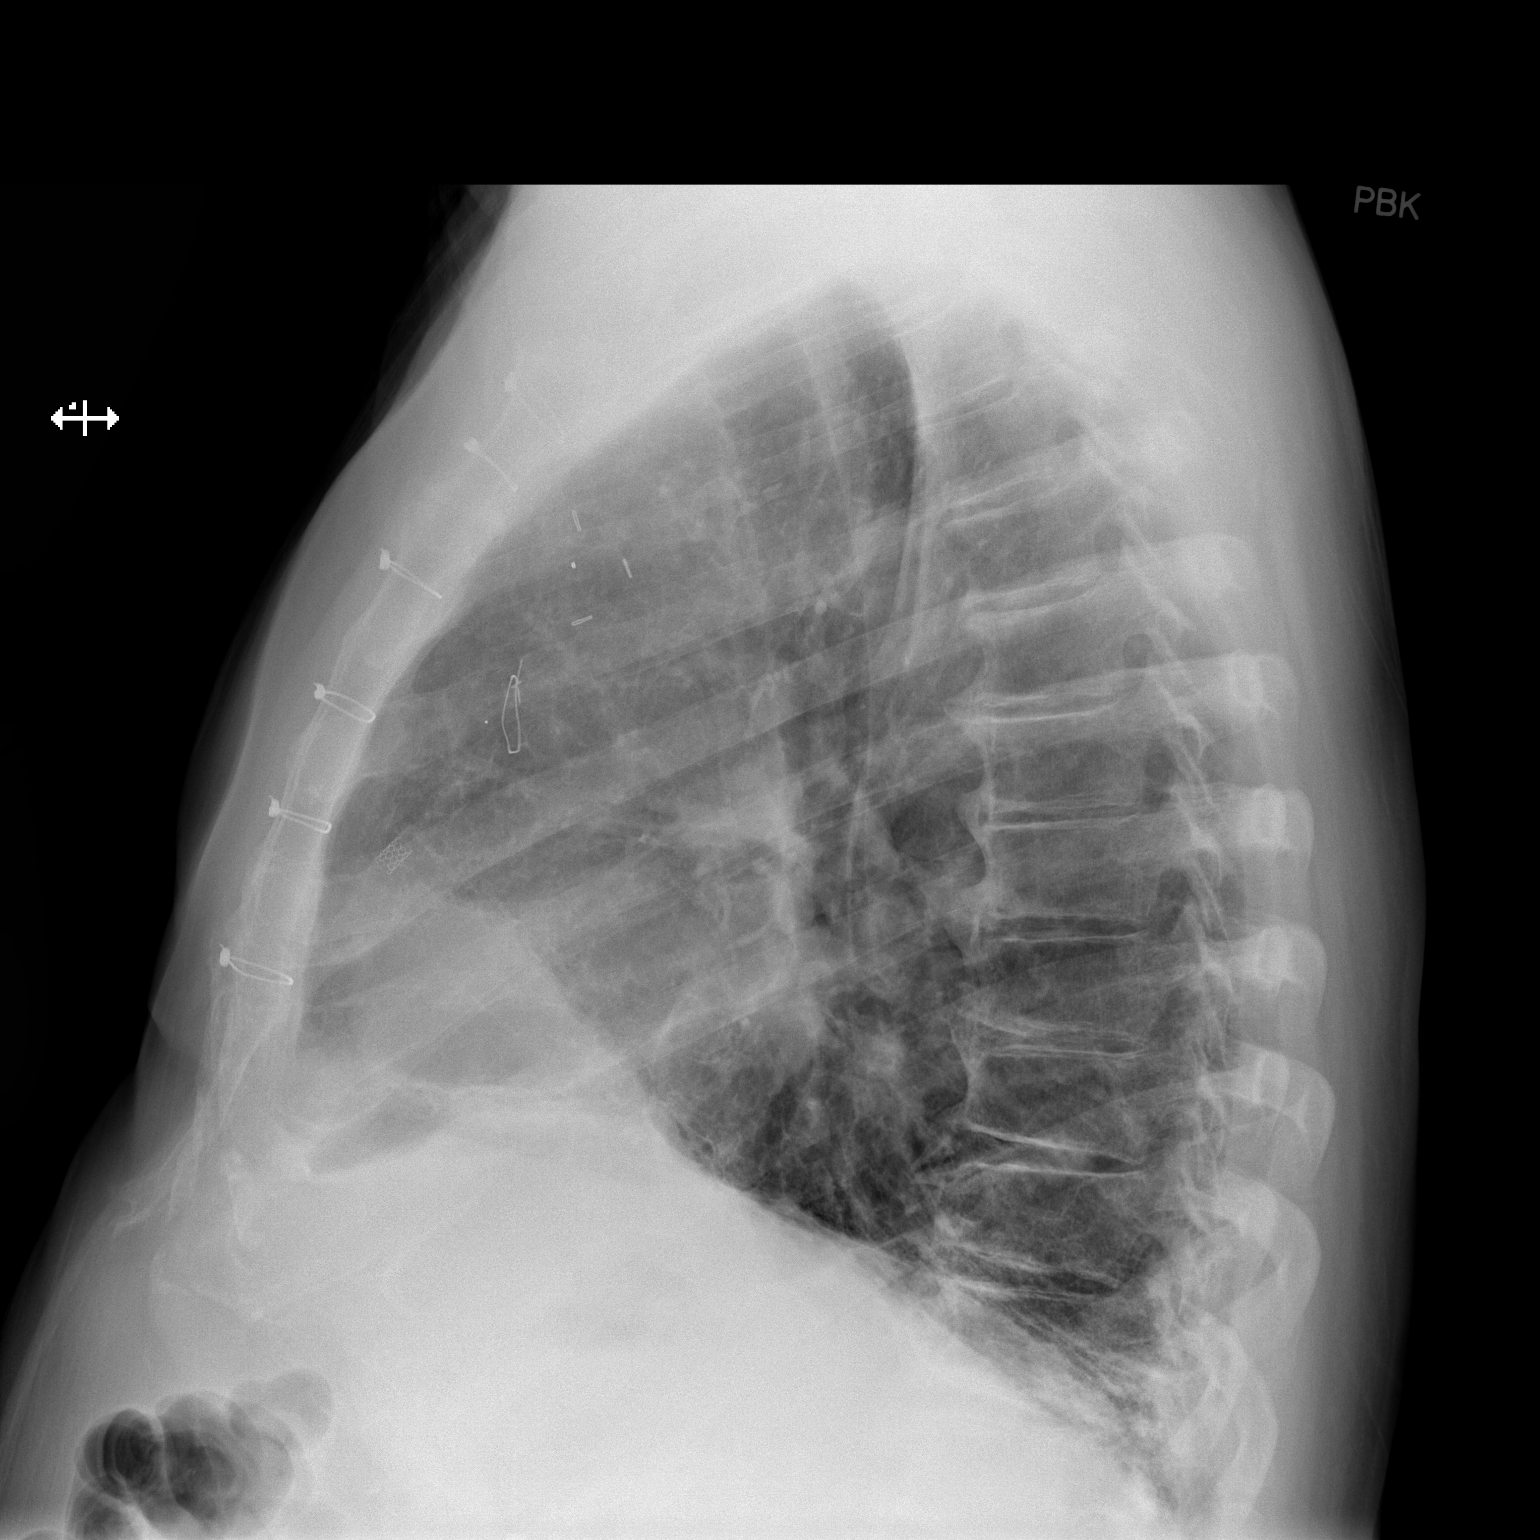

[2 of 2 positions shown; findings below may reference images not displayed]

FINDINGS: Stable cardiac and mediastinal contours status post median
sternotomy. Unchanged left greater than right lower lung scarring.
No superimposed pulmonary consolidation. Thoracic spine degenerative
changes.
IMPRESSION: No acute cardiopulmonary process.

## 2019-04-16 ENCOUNTER — Other Ambulatory Visit: Payer: Self-pay | Admitting: *Deleted

## 2019-04-16 NOTE — Patient Outreach (Signed)
[facility]) Care Management  04/16/2019  MEHKAI GALLO Feb 14, 1951 [id number]   Unsuccessful outreach attempt made to patient.  RN Health Coach left HIPAA compliant voicemail message along with her contact information.  Plan: RN Health Coach will call patient within 3-4 business days RN Health Coach will send unsuccessful letter   Emelia Loron RN, BSN [place] [phone] [email or web address]

## 2019-04-19 ENCOUNTER — Other Ambulatory Visit: Payer: Self-pay | Admitting: *Deleted

## 2019-04-19 NOTE — Patient Outreach (Signed)
[facility]) Care Management  04/19/2019  [name] 1951/01/24 [id number]  Unsuccessful outreach attempt made to patient.  RN Health Coach left HIPAA compliant voicemail message along with her contact information.  Plan: RN Health Coach will call patient within 3-6 business days   Emelia Loron RN, [place] [phone] [email or web address]

## 2019-04-29 ENCOUNTER — Other Ambulatory Visit: Payer: Self-pay | Admitting: *Deleted

## 2019-04-29 NOTE — Patient Outreach (Signed)
[facility]) Care Management  04/29/2019  RENDELL THIVIERGE [birth year] [id number]  Multiple attempts to establish contact with patient without success. No response from letter mailed to patient. Case is being closed at this time.   Plan: RN Health Coach will close case.  Emelia Loron RN, BSN [place] [phone] [email or web address]

## 2019-06-21 ENCOUNTER — Other Ambulatory Visit: Payer: Self-pay | Admitting: Interventional Cardiology

## 2019-06-28 ENCOUNTER — Other Ambulatory Visit: Payer: Self-pay | Admitting: Interventional Cardiology

## 2019-06-28 DIAGNOSIS — I739 Peripheral vascular disease, unspecified: Secondary | ICD-10-CM

## 2019-07-02 ENCOUNTER — Other Ambulatory Visit: Payer: Self-pay | Admitting: Vascular Surgery

## 2019-08-07 ENCOUNTER — Ambulatory Visit (INDEPENDENT_AMBULATORY_CARE_PROVIDER_SITE_OTHER): Payer: Medicare Other

## 2019-08-07 ENCOUNTER — Ambulatory Visit (INDEPENDENT_AMBULATORY_CARE_PROVIDER_SITE_OTHER): Payer: Medicare Other | Admitting: Podiatry

## 2019-08-07 ENCOUNTER — Other Ambulatory Visit: Payer: Self-pay

## 2019-08-07 DIAGNOSIS — M778 Other enthesopathies, not elsewhere classified: Secondary | ICD-10-CM | POA: Diagnosis not present

## 2019-08-07 DIAGNOSIS — M79672 Pain in left foot: Secondary | ICD-10-CM | POA: Diagnosis not present

## 2019-08-07 DIAGNOSIS — M21622 Bunionette of left foot: Secondary | ICD-10-CM | POA: Diagnosis not present

## 2019-08-08 ENCOUNTER — Encounter: Payer: Self-pay | Admitting: Podiatry

## 2019-08-08 NOTE — Progress Notes (Signed)
Subjective:  Patient ID: Richard Parrish, male    DOB: 1950/05/04,  MRN: [id number]  Chief Complaint  Patient presents with  . Foot Pain    pt is here for left foot pain, possible cyst of the lefft foot lateral side    69 y.o. male presents with the above complaint.  Patient presents with pain to the left lateral foot submetatarsal 5 metatarsophalangeal joint.  Patient states that this pain is well for 6 months has progressively gotten worse.  Pain is Richard Parrish states that is localized and sharp shooting.  He has tried some shoe gear modification but to no use.  He has not tried any other form of treatment.  Pain is elevated when applying pressure.  Pain is 7 out of 10.  It is sharp shooting in nature.   Review of Systems: Negative except as noted in the HPI. Denies N/V/F/Ch.  Past Medical History:  Diagnosis Date  . Anxiety   . Arthritis    "lower back" (04/07/2017)  . BPH (benign prostatic hypertrophy)   . C. difficile diarrhea   . CAD (coronary artery disease)   . Chronic lower back pain   . Coronary artery disease 1989   cabg with dvg to rca   . Depression   . GERD (gastroesophageal reflux disease)   . Hyperlipidemia   . Hypertension   . Insomnia   . Leg pain   . Myocardial infarction ([place]) 2002  . OSA on CPAP    cpap setting of 60 per pt  . Osteoarthritis    "hips" (04/07/2017)  . PAD (peripheral artery disease) ([place])   . Peripheral vascular disease ([place])   . Pneumonia 1989   S/P CABG  . Pulmonary fibrosis ([place])    "one time" (04/07/2017)  . Spinal stenosis     Current Outpatient Medications:  .  acetaminophen (TYLENOL) 500 MG tablet, Take 500 mg by mouth every 6 (six) hours as needed., Disp: , Rfl:  .  aspirin EC 81 MG tablet, Take 81 mg by mouth daily., Disp: , Rfl:  .  atorvastatin (LIPITOR) 80 MG tablet, TAKE ONE TABLET BY MOUTH DAILY, Disp: 90 tablet, Rfl: 2 .  budesonide (ENTOCORT EC) 3 MG 24 hr capsule, Take 9 mg by mouth daily., Disp: , Rfl:  .  clopidogrel  (PLAVIX) 75 MG tablet, TAKE ONE TABLET BY MOUTH EVERY DAY, Disp: 90 tablet, Rfl: 0 .  Glucosamine HCl-MSM (GLUCOSAMINE-MSM PO), Take 1 tablet by mouth every other day., Disp: , Rfl:  .  HYDROcodone-acetaminophen (NORCO/VICODIN) 5-325 MG tablet, Take 1 tablet by mouth 5 (five) times daily. , Disp: , Rfl:  .  hyoscyamine (LEVSIN, ANASPAZ) 0.125 MG tablet, Take 0.125 mg by mouth every 4 (four) hours as needed for cramping. , Disp: , Rfl:  .  metoprolol succinate (TOPROL-XL) 50 MG 24 hr tablet, TAKE ONE TABLET BY MY MOUTH EVERY DAY. TAKE AFTER A MEAL., Disp: 90 tablet, Rfl: 2 .  Multiple Vitamin (MULTIVITAMIN) tablet, Take 1 tablet by mouth every other day. , Disp: , Rfl:  .  nitroGLYCERIN (NITROSTAT) 0.4 MG SL tablet, Place 0.4 mg under the tongue every 5 (five) minutes as needed for chest pain., Disp: , Rfl:  .  Omega-3 Fatty Acids (FISH OIL) 1000 MG CAPS, Take 1,000 mg by mouth every other day. , Disp: , Rfl:  .  omeprazole (PRILOSEC OTC) 20 MG tablet, Take 20 mg by mouth daily before breakfast., Disp: , Rfl:  .  PARoxetine (PAXIL) 40 MG  tablet, Take 40 mg by mouth daily., Disp: , Rfl: 6 .  Probiotic Product (PROBIOTIC PO), Take 1 capsule by mouth daily., Disp: , Rfl:  .  tamsulosin (FLOMAX) 0.4 MG CAPS capsule, Take 1 capsule by mouth every morning., Disp: , Rfl: 5 .  traZODone (DESYREL) 50 MG tablet, Take 50 mg by mouth at bedtime., Disp: , Rfl:  .  valsartan (DIOVAN) 320 MG tablet, TAKE ONE TABLET BY MOUTH EVERY DAY, Disp: 90 tablet, Rfl: 2  Social History   Tobacco Use  Smoking Status Former Smoker  . Packs/day: 2.00  . Years: 15.00  . Pack years: 30.00  . Types: Cigarettes  . Quit date: 03/01/1987  . Years since quitting: 32.4  Smokeless Tobacco Never Used    Allergies  Allergen Reactions  . Ambien [Zolpidem Tartrate] Other (See Comments)    Bad dreams/Vivid Dreams   Objective:  There were no vitals filed for this visit. There is no height or weight on file to calculate  BMI. Constitutional Well developed. Well nourished.  Vascular Dorsalis pedis pulses palpable bilaterally. Posterior tibial pulses palpable bilaterally. Capillary refill normal to all digits.  No cyanosis or clubbing noted. Pedal hair growth normal.  Neurologic Normal speech. Oriented to person, place, and time. Epicritic sensation to light touch grossly present bilaterally.  Dermatologic Nails well groomed and normal in appearance. No open wounds. No skin lesions.  Orthopedic:  Pain on palpation to the hyperkeratotic lesion submetatarsal 5.  No intra-articular pain noted at the fifth on the left side metatarsophalangeal joint.  Pain on palpation to the lateral prominence.  Clinically patient has pain with tailor's bunion with plantar submetatarsal 5 hyperkeratotic lesion.  No intra-articular pain.  No pain with range of motion of the fifth digit active and passive.   Radiographs: 3 views of skeletally mature adult left foot: There is increase in lateral deviation angle.  No increase in intermetatarsal angle.  Accessory sesamoid identified.  There is adductovarus rotation of the fifth digit.  No osteoarthritic or osteophytes noted within the fifth metatarsophalangeal joint.  No other bony abnormalities identified. Assessment:   1. Foot pain, left   2. Capsulitis of foot, left   3. Tailor's bunion of left foot    Plan:  Patient was evaluated and treated and all questions answered.  Left fifth metatarsophalangeal joint capsulitis secondary to tailor's bunion -I explained the patient the etiology of capsulitis in the setting of tailor's bunion and various treatment options were discussed.  It appears to examining gait mechanics he is applying excessive pressure to the lateral side of the foot underneath the submetatarsal 5.  This is leading to inflammation.  I believe he will benefit from steroid injection to help address the acute inflammation associated with pain.  Patient agrees with the  plan would like to proceed with injection.  Also discussed with the patient shoe gear modification which she will work on as well. -A steroid injection was performed at left fifth metatarsophalangeal joint using 1% plain Lidocaine and 10 mg of Kenalog. This was well tolerated.  -If there is no improvement with injection will consider surgical management of tailor's bunion and I will discuss it during next clinical visit.  No follow-ups on file.

## 2019-09-11 ENCOUNTER — Ambulatory Visit (INDEPENDENT_AMBULATORY_CARE_PROVIDER_SITE_OTHER): Payer: Medicare Other | Admitting: Podiatry

## 2019-09-11 ENCOUNTER — Other Ambulatory Visit: Payer: Self-pay

## 2019-09-11 DIAGNOSIS — M21622 Bunionette of left foot: Secondary | ICD-10-CM | POA: Diagnosis not present

## 2019-09-11 DIAGNOSIS — M778 Other enthesopathies, not elsewhere classified: Secondary | ICD-10-CM

## 2019-09-11 DIAGNOSIS — M79672 Pain in left foot: Secondary | ICD-10-CM

## 2019-09-12 ENCOUNTER — Encounter: Payer: Self-pay | Admitting: Podiatry

## 2019-09-12 NOTE — Progress Notes (Signed)
Subjective:  Patient ID: Richard Parrish, male    DOB: Nov 30, 1950,  MRN: [id number]  Chief Complaint  Patient presents with  . Foot Pain    Pt states improvement, change in shoes has helped as well as previous injection.    69 y.o. male presents with the above complaint.  Patient presents with a follow-up of left lateral foot submetatarsal fifth metatarsophalangeal joint capsulitis with pain.  Patient states his pain is completely resolved.  He states he is doing really well.  He has been able to return to golf without any reservation.  He has made shoe gear modification which helped a lot.  The steroid injection helped considerably as well.   Review of Systems: Negative except as noted in the HPI. Denies N/V/F/Ch.  Past Medical History:  Diagnosis Date  . Anxiety   . Arthritis    "lower back" (04/07/2017)  . BPH (benign prostatic hypertrophy)   . C. difficile diarrhea   . CAD (coronary artery disease)   . Chronic lower back pain   . Coronary artery disease 1989   cabg with dvg to rca   . Depression   . GERD (gastroesophageal reflux disease)   . Hyperlipidemia   . Hypertension   . Insomnia   . Leg pain   . Myocardial infarction ([place]) 2002  . OSA on CPAP    cpap setting of 60 per pt  . Osteoarthritis    "hips" (04/07/2017)  . PAD (peripheral artery disease) ([place])   . Peripheral vascular disease ([place])   . Pneumonia 1989   S/P CABG  . Pulmonary fibrosis ([place])    "one time" (04/07/2017)  . Spinal stenosis     Current Outpatient Medications:  .  acetaminophen (TYLENOL) 500 MG tablet, Take 500 mg by mouth every 6 (six) hours as needed., Disp: , Rfl:  .  aspirin EC 81 MG tablet, Take 81 mg by mouth daily., Disp: , Rfl:  .  atorvastatin (LIPITOR) 80 MG tablet, TAKE ONE TABLET BY MOUTH DAILY, Disp: 90 tablet, Rfl: 2 .  budesonide (ENTOCORT EC) 3 MG 24 hr capsule, Take 9 mg by mouth daily., Disp: , Rfl:  .  clopidogrel (PLAVIX) 75 MG tablet, TAKE ONE TABLET BY MOUTH EVERY DAY,  Disp: 90 tablet, Rfl: 0 .  erythromycin ophthalmic ointment, Apply 1 cm long ribbon to lower right eyelid every 6 hours for 3-5 days until healed., Disp: , Rfl:  .  Glucosamine HCl-MSM (GLUCOSAMINE-MSM PO), Take 1 tablet by mouth every other day., Disp: , Rfl:  .  HYDROcodone-acetaminophen (NORCO/VICODIN) 5-325 MG tablet, Take 1 tablet by mouth 5 (five) times daily. , Disp: , Rfl:  .  hyoscyamine (LEVSIN, ANASPAZ) 0.125 MG tablet, Take 0.125 mg by mouth every 4 (four) hours as needed for cramping. , Disp: , Rfl:  .  metaxalone (SKELAXIN) 800 MG tablet, TAKE 1 TABLET BY MOUTH THREE TIMES A DAY AS NEEDED *NOT COVERED BY INSURANCE*, Disp: , Rfl:  .  Methylsulfonylmethane 1000 MG TABS, Take by mouth., Disp: , Rfl:  .  metoprolol succinate (TOPROL-XL) 50 MG 24 hr tablet, TAKE ONE TABLET BY MY MOUTH EVERY DAY. TAKE AFTER A MEAL., Disp: 90 tablet, Rfl: 2 .  Multiple Vitamin (MULTIVITAMIN) tablet, Take 1 tablet by mouth every other day. , Disp: , Rfl:  .  neomycin-polymyxin-dexameth (MAXITROL) 0.1 % OINT, APPLY A THIN LAYER TO THE UPPER LEFT LID AFTER WARM COMPRESS AND MASSAGE TWICE A DAY X 7 DAYS, Disp: , Rfl:  .  nitroGLYCERIN (NITROSTAT) 0.4 MG SL tablet, Place 0.4 mg under the tongue every 5 (five) minutes as needed for chest pain., Disp: , Rfl:  .  Omega-3 Fatty Acids (FISH OIL) 1000 MG CAPS, Take 1,000 mg by mouth every other day. , Disp: , Rfl:  .  omeprazole (PRILOSEC OTC) 20 MG tablet, Take 20 mg by mouth daily before breakfast., Disp: , Rfl:  .  PARoxetine (PAXIL) 40 MG tablet, Take 40 mg by mouth daily., Disp: , Rfl: 6 .  Probiotic Product (PROBIOTIC PO), Take 1 capsule by mouth daily., Disp: , Rfl:  .  tamsulosin (FLOMAX) 0.4 MG CAPS capsule, Take 1 capsule by mouth every morning., Disp: , Rfl: 5 .  traZODone (DESYREL) 50 MG tablet, Take 50 mg by mouth at bedtime., Disp: , Rfl:  .  valsartan (DIOVAN) 320 MG tablet, TAKE ONE TABLET BY MOUTH EVERY DAY, Disp: 90 tablet, Rfl: 2  Social History    Tobacco Use  Smoking Status Former Smoker  . Packs/day: 2.00  . Years: 15.00  . Pack years: 30.00  . Types: Cigarettes  . Quit date: 03/01/1987  . Years since quitting: 32.5  Smokeless Tobacco Never Used    Allergies  Allergen Reactions  . Ambien [Zolpidem Tartrate] Other (See Comments)    Bad dreams/Vivid Dreams  . Zolpidem Other (See Comments)    Bad dreams/Vivid Dreams   Objective:  There were no vitals filed for this visit. There is no height or weight on file to calculate BMI. Constitutional Well developed. Well nourished.  Vascular Dorsalis pedis pulses palpable bilaterally. Posterior tibial pulses palpable bilaterally. Capillary refill normal to all digits.  No cyanosis or clubbing noted. Pedal hair growth normal.  Neurologic Normal speech. Oriented to person, place, and time. Epicritic sensation to light touch grossly present bilaterally.  Dermatologic Nails well groomed and normal in appearance. No open wounds. No skin lesions.  Orthopedic:  No pain on palpation to the hyperkeratotic lesion submetatarsal 5.  No intra-articular pain noted at the fifth on the left side metatarsophalangeal joint.  Pain on palpation to the lateral prominence.  Clinically patient does not have pain with tailor's bunion with plantar submetatarsal 5 hyperkeratotic lesion.  No intra-articular pain.  No pain with range of motion of the fifth digit active and passive.   Radiographs: 3 views of skeletally mature adult left foot: There is increase in lateral deviation angle.  No increase in intermetatarsal angle.  Accessory sesamoid identified.  There is adductovarus rotation of the fifth digit.  No osteoarthritic or osteophytes noted within the fifth metatarsophalangeal joint.  No other bony abnormalities identified. Assessment:   1. Capsulitis of foot, left   2. Foot pain, left   3. Tailor's bunion of left foot    Plan:  Patient was evaluated and treated and all questions  answered.  Left fifth metatarsophalangeal joint capsulitis secondary to tailor's bunion -Clinically the pain has completely resolved.  The steroid injection has considerably helped.  She he has made shoe gear modification which took the pressure off of the also part of the toe which helped considerably. -I explained to the patient that if this recurs and continues to get worse at this point will think about surgical correction of the tailor's bunion and therefore take the stress off of the plantar lateral foot. -Patient can return to regular activities without reservation.  I have asked him to come back and see me if any foot and ankle issues arise in the future.  Patient states  understanding  No follow-ups on file.

## 2019-09-17 ENCOUNTER — Ambulatory Visit
Admission: EM | Admit: 2019-09-17 | Discharge: 2019-09-17 | Disposition: A | Discharge status: Home / Self Care | Payer: Medicare Other | Attending: Physician Assistant | Admitting: Physician Assistant

## 2019-09-17 DIAGNOSIS — M79645 Pain in left finger(s): Secondary | ICD-10-CM

## 2019-09-17 MED ORDER — CEPHALEXIN 500 MG PO CAPS
500.0000 mg | ORAL_CAPSULE | Freq: Four times a day (QID) | ORAL | 0 refills | Status: DC
Start: 2019-09-17 — End: 2020-08-03

## 2019-09-17 NOTE — Discharge Instructions (Signed)
Start keflex as directed. Warm compress. No need for dressing the wound given no open wounds at this time. Follow up with PCP if symptoms not improving for reevaluation. If noticing worsening swelling, redness, fever, follow up for reevaluation.

## 2019-09-17 NOTE — ED Triage Notes (Addendum)
Patient is here today with complaints of left indexed finger that he hit on a sharp saw that made a little cut/nick about a couple of weeks ago. Patient states he is up to date on his tetanus vaccine as well. Patient states he has tried hot compresses and erythromycin(opthalimticfor eye) with no relief.

## 2019-09-17 NOTE — ED Provider Notes (Signed)
[facility]    CSN: [id number] Arrival date & time: 09/17/19  1358      History   Chief Complaint Chief Complaint  Patient presents with  . Hand Pain    HPI Richard Parrish is a 69 y.o. male.   69 year old male comes in for left index finger pain, swelling, redness for the past week. Had small wound to the index finger sustained 2-3 weeks ago that has been healing well. However, now with increased swelling, soreness, redness. Denies any active drainage. Was dressing with erythromycin ophthalmic ointment. Tetanus up to date.      Past Medical History:  Diagnosis Date  . Anxiety   . Arthritis    "lower back" (04/07/2017)  . BPH (benign prostatic hypertrophy)   . C. difficile diarrhea   . CAD (coronary artery disease)   . Chronic lower back pain   . Coronary artery disease 1989   cabg with dvg to rca   . Depression   . GERD (gastroesophageal reflux disease)   . Hyperlipidemia   . Hypertension   . Insomnia   . Leg pain   . Myocardial infarction ([place]) 2002  . OSA on CPAP    cpap setting of 60 per pt  . Osteoarthritis    "hips" (04/07/2017)  . PAD (peripheral artery disease) ([place])   . Peripheral vascular disease ([place])   . Pneumonia 1989   S/P CABG  . Pulmonary fibrosis ([place])    "one time" (04/07/2017)  . Spinal stenosis     Patient Active Problem List   Diagnosis Date Noted  . Disc degeneration, lumbar 02/10/2018  . Aortic atherosclerosis ([place]) 11/13/2017  . Pure hypercholesterolemia 11/13/2017  . Degenerative joint disease of left hip 04/07/2017  . Dog bite of face 08/08/2016  . Right hip pain 06/01/2016  . Achilles tendon pain 12/31/2015  . Postinflammatory pulmonary fibrosis ([place]) 07/24/2013  . Cough 07/24/2013  . CAD (coronary artery disease), native coronary artery 04/01/2013  . Essential hypertension, benign 12/12/2012    Class: Chronic  . Other and unspecified hyperlipidemia 12/12/2012  . Atherosclerosis of native arteries of the  extremities with intermittent claudication 11/25/2011  . Peripheral vascular disease, unspecified ([place]) 02/28/2011    Past Surgical History:  Procedure Laterality Date  . ABDOMINAL AORTAGRAM N/A 03/15/2011   Procedure: ABDOMINAL Maxcine Ham;  Surgeon: Serafina Mitchell, MD;  Location: [facility] CATH LAB;  Service: Cardiovascular;  Laterality: N/A;  . ABDOMINAL AORTAGRAM N/A 04/12/2011   Procedure: ABDOMINAL Maxcine Ham;  Surgeon: Serafina Mitchell, MD;  Location[facility] CATH LAB;  Service: Cardiovascular;  Laterality: N/A;  . BACK SURGERY    . CARDIAC CATHETERIZATION  08/1987  . CORONARY ANGIOPLASTY WITH STENT PLACEMENT  ~ [facility], [place]  . CORONARY ARTERY BYPASS GRAFT  09/1987   "CABG X1";  [facility]; [place]; "RCA"  . JOINT REPLACEMENT    . KNEE ARTHROSCOPY Left   . LAPAROSCOPIC CHOLECYSTECTOMY    . PERIPHERAL VASCULAR INTERVENTION Left 2016   "put a stent; right branch of left femoral"  . POSTERIOR LUMBAR FUSION  2009   lumbar fusion, S L 5 to L 4  . SHOULDER ARTHROSCOPY W/ ROTATOR CUFF REPAIR Right 2006  . SHOULDER ARTHROSCOPY WITH SUBACROMIAL DECOMPRESSION AND OPEN ROTATOR C Left ~ 1983  . TONSILLECTOMY AND ADENOIDECTOMY    . TOTAL HIP ARTHROPLASTY Right 2012  . TOTAL HIP ARTHROPLASTY Left 04/07/2017  . TOTAL HIP ARTHROPLASTY Left 04/07/2017   Procedure:  LEFT TOTAL HIP ARTHROPLASTY;  Surgeon: Earlie Server, MD;  Location: [facility];  Service: Orthopedics;  Laterality: Left;       Home Medications    Prior to Admission medications   Medication Sig Start Date End Date Taking? Authorizing Provider  acetaminophen (TYLENOL) 500 MG tablet Take 500 mg by mouth every 6 (six) hours as needed.    [provider]  aspirin EC 81 MG tablet Take 81 mg by mouth daily.    [provider]  atorvastatin (LIPITOR) 80 MG tablet TAKE ONE TABLET BY MOUTH DAILY 06/21/19   Belva Crome, MD  budesonide (ENTOCORT EC) 3 MG 24 hr capsule Take 9 mg by mouth daily.    [provider]  cephALEXin (KEFLEX) 500 MG capsule Take 1 capsule (500 mg total) by mouth 4 (four) times daily. 09/17/19   Tasia Catchings, Jess Sulak V, PA-C  clopidogrel (PLAVIX) 75 MG tablet TAKE ONE TABLET BY MOUTH EVERY DAY 07/03/19   Waynetta Sandy, MD  erythromycin ophthalmic ointment Apply 1 cm long ribbon to lower right eyelid every 6 hours for 3-5 days until healed. 09/02/19   [provider]  Glucosamine HCl-MSM (GLUCOSAMINE-MSM PO) Take 1 tablet by mouth every other day.    [provider]  HYDROcodone-acetaminophen (NORCO/VICODIN) 5-325 MG tablet Take 1 tablet by mouth 5 (five) times daily.     [provider]  hyoscyamine (LEVSIN, ANASPAZ) 0.125 MG tablet Take 0.125 mg by mouth every 4 (four) hours as needed for cramping.     [provider]  Methylsulfonylmethane 1000 MG TABS Take by mouth.    [provider]  metoprolol succinate (TOPROL-XL) 50 MG 24 hr tablet TAKE ONE TABLET BY MY MOUTH EVERY DAY. TAKE AFTER A MEAL. 06/21/19   Belva Crome, MD  Multiple Vitamin (MULTIVITAMIN) tablet Take 1 tablet by mouth every other day.     [provider]  nitroGLYCERIN (NITROSTAT) 0.4 MG SL tablet Place 0.4 mg under the tongue every 5 (five) minutes as needed for chest pain.    [provider]  Omega-3 Fatty Acids (FISH OIL) 1000 MG CAPS Take 1,000 mg by mouth every other day.     [provider]  omeprazole (PRILOSEC OTC) 20 MG tablet Take 20 mg by mouth daily before breakfast.    [provider]  PARoxetine (PAXIL) 40 MG tablet Take 40 mg by mouth daily. 01/05/15   [provider]  Probiotic Product (PROBIOTIC PO) Take 1 capsule by mouth daily.    [provider]  tamsulosin (FLOMAX) 0.4 MG CAPS capsule Take 1 capsule by mouth every morning. 08/18/14   [provider]  traZODone (DESYREL) 50 MG tablet Take 50 mg by mouth at bedtime. 11/22/18   [provider]  valsartan (DIOVAN) 320 MG tablet  TAKE ONE TABLET BY MOUTH EVERY DAY 06/21/19   Belva Crome, MD    Family History Family History  Problem Relation Age of Onset  . Heart disease Mother   . Cancer Father     Social History Social History   Tobacco Use  . Smoking status: Former Smoker    Packs/day: 2.00    Years: 15.00    Pack years: 30.00    Types: Cigarettes    Quit date: 03/01/1987    Years since quitting: 32.5  . Smokeless tobacco: Never Used  Vaping Use  . Vaping Use: Never used  Substance Use Topics  . Alcohol use: Yes    Comment:  04/07/2017 'couple drinks/month"  . Drug use: No     Allergies   Ambien [zolpidem tartrate] and Zolpidem   Review of Systems Review of Systems  Reason unable to perform ROS: See HPI as above.     Physical Exam Triage Vital Signs ED Triage Vitals  Enc Vitals Group     BP 09/17/19 1447 119/79     Pulse Rate 09/17/19 1447 60     Resp 09/17/19 1447 16     Temp 09/17/19 1447 97.8 F (36.6 C)     Temp Source 09/17/19 1447 Oral     SpO2 09/17/19 1447 98 %     Weight --      Height --      Head Circumference --      Peak Flow --      Pain Score 09/17/19 1450 2     Pain Loc --      Pain Edu? --      Excl. in [place]? --    No data found.  Updated Vital Signs BP 119/79 (BP Location: Right Arm)   Pulse 60   Temp 97.8 F (36.6 C) (Oral)   Resp 16   SpO2 98%   Physical Exam Constitutional:      General: He is not in acute distress.    Appearance: Normal appearance. He is well-developed. He is not toxic-appearing or diaphoretic.  HENT:     Head: Normocephalic and atraumatic.  Eyes:     Conjunctiva/sclera: Conjunctivae normal.     Pupils: Pupils are equal, round, and reactive to light.  Pulmonary:     Effort: Pulmonary effort is normal. No respiratory distress.  Musculoskeletal:     Cervical back: Normal range of motion and neck supple.     Comments: Small wound to the left index finger that is scabbed no drainage. Swelling to the PIP with erythema, no  current warmth. Full ROM of finger. NVI  Skin:    General: Skin is warm and dry.  Neurological:     Mental Status: He is alert and oriented to person, place, and time.      UC Treatments / Results  Labs (all labs ordered are listed, but only abnormal results are displayed) Labs Reviewed - No data to display  EKG   Radiology No results found.  Procedures Procedures (including critical care time)  Medications Ordered in UC Medications - No data to display  Initial Impression / Assessment and Plan / UC Course  I have reviewed the triage vital signs and the nursing notes.  Pertinent labs & imaging results that were available during my care of the patient were reviewed by me and considered in my medical decision making (see chart for details).    Keflex as directed. Warm compresses as directed. Return precautions given. Patient expresses understanding and agrees to plan.  Final Clinical Impressions(s) / UC Diagnoses   Final diagnoses:  Finger pain, left   ED Prescriptions    Medication Sig Dispense Auth. Provider   cephALEXin (KEFLEX) 500 MG capsule Take 1 capsule (500 mg total) by mouth 4 (four) times daily. 28 capsule Ok Edwards, PA-C     PDMP not reviewed this encounter.   Ok Edwards, PA-C 09/17/19 1514

## 2019-09-19 ENCOUNTER — Other Ambulatory Visit: Payer: Self-pay | Admitting: Podiatry

## 2019-09-19 DIAGNOSIS — M778 Other enthesopathies, not elsewhere classified: Secondary | ICD-10-CM

## 2019-10-06 ENCOUNTER — Other Ambulatory Visit: Payer: Self-pay | Admitting: Vascular Surgery

## 2019-12-23 ENCOUNTER — Other Ambulatory Visit: Payer: Medicare Other

## 2019-12-23 DIAGNOSIS — Z20822 Contact with and (suspected) exposure to covid-19: Secondary | ICD-10-CM

## 2019-12-24 LAB — SARS-COV-2, NAA 2 DAY TAT

## 2019-12-24 LAB — NOVEL CORONAVIRUS, NAA: SARS-CoV-2, NAA: DETECTED — AB

## 2019-12-25 ENCOUNTER — Other Ambulatory Visit: Payer: Self-pay | Admitting: Infectious Diseases

## 2019-12-25 ENCOUNTER — Telehealth: Payer: Self-pay | Admitting: Infectious Diseases

## 2019-12-25 DIAGNOSIS — I25118 Atherosclerotic heart disease of native coronary artery with other forms of angina pectoris: Secondary | ICD-10-CM

## 2019-12-25 DIAGNOSIS — J8410 Pulmonary fibrosis, unspecified: Secondary | ICD-10-CM

## 2019-12-25 DIAGNOSIS — U071 COVID-19: Secondary | ICD-10-CM

## 2019-12-25 NOTE — Telephone Encounter (Signed)
Called to Discuss with patient about Covid symptoms and the use of the monoclonal antibody infusion for those with mild to moderate Covid symptoms and at a high risk of hospitalization.     Pt appears to qualify for this infusion due to co-morbid conditions and/or a member of an at-risk group in accordance with the FDA Emergency Use Authorization.    Sx started 10/21. Biggest symptom now is back pain and nasal congestion.  Qualifies for infusion with COPD, hypertension.   He has been vaccinated. I gave him our clinic information.    Janene Madeira, MSN, NP-C [email or web address] Pager: [phone] Office: [phone] [place]: [phone]

## 2019-12-25 NOTE — [place] Notes (Signed)
I connected by phone with Richard Parrish on 12/25/2019 at 12:28 PM to discuss the potential use of a new treatment for mild to moderate COVID-19 viral infection in non-hospitalized patients.  This patient is a 69 y.o. male that meets the FDA criteria for Emergency Use Authorization of COVID monoclonal antibody casirivimab/imdevimab or bamlanivimab/eteseviamb.  Has a (+) direct SARS-CoV-2 viral test result  Has mild or moderate COVID-19   Is NOT hospitalized due to COVID-19  Is within 10 days of symptom onset  Has at least one of the high risk factor(s) for progression to severe COVID-19 and/or hospitalization as defined in EUA.  Specific high risk criteria : Older age (>/= 70 yo), Cardiovascular disease or hypertension and Chronic Lung Disease   I have spoken and communicated the following to the patient or parent/caregiver regarding COVID monoclonal antibody treatment:  1. FDA has authorized the emergency use for the treatment of mild to moderate COVID-19 in adults and pediatric patients with positive results of direct SARS-CoV-2 viral testing who are 55 years of age and older weighing at least 40 kg, and who are at high risk for progressing to severe COVID-19 and/or hospitalization.  2. The significant known and potential risks and benefits of COVID monoclonal antibody, and the extent to which such potential risks and benefits are unknown.  3. Information on available alternative treatments and the risks and benefits of those alternatives, including clinical trials.  4. Patients treated with COVID monoclonal antibody should continue to self-isolate and use infection control measures (e.g., wear mask, isolate, social distance, avoid sharing personal items, clean and disinfect "high touch" surfaces, and frequent handwashing) according to CDC guidelines.   5. The patient or parent/caregiver has the option to accept or refuse COVID monoclonal antibody treatment.  After reviewing this  information with the patient, the patient has agreed to receive one of the available covid 19 monoclonal antibodies and will be provided an appropriate fact sheet prior to infusion. Richard Madeira, NP 12/25/2019 12:28 PM

## 2019-12-26 ENCOUNTER — Ambulatory Visit (HOSPITAL_COMMUNITY)
Admission: RE | Admit: 2019-12-26 | Discharge: 2019-12-26 | Disposition: A | Discharge status: Home / Self Care | Payer: Medicare Other | Source: Ambulatory Visit | Attending: Pulmonary Disease | Admitting: Pulmonary Disease

## 2019-12-26 DIAGNOSIS — Z23 Encounter for immunization: Secondary | ICD-10-CM | POA: Insufficient documentation

## 2019-12-26 DIAGNOSIS — U071 COVID-19: Secondary | ICD-10-CM

## 2019-12-26 DIAGNOSIS — I25118 Atherosclerotic heart disease of native coronary artery with other forms of angina pectoris: Secondary | ICD-10-CM

## 2019-12-26 DIAGNOSIS — J8410 Pulmonary fibrosis, unspecified: Secondary | ICD-10-CM | POA: Diagnosis present

## 2019-12-26 MED ORDER — EPINEPHRINE 0.3 MG/0.3ML IJ SOAJ: 0.3000 mg | Freq: Once | INTRAMUSCULAR | Status: DC | PRN

## 2019-12-26 MED ORDER — ALBUTEROL SULFATE HFA 108 (90 BASE) MCG/ACT IN AERS: 2.0000 | INHALATION_SPRAY | Freq: Once | RESPIRATORY_TRACT | Status: DC | PRN

## 2019-12-26 MED ORDER — DIPHENHYDRAMINE HCL 50 MG/ML IJ SOLN: 50.0000 mg | Freq: Once | INTRAMUSCULAR | Status: DC | PRN

## 2019-12-26 MED ORDER — SODIUM CHLORIDE 0.9 % IV SOLN: INTRAVENOUS | Status: DC | PRN

## 2019-12-26 MED ORDER — SODIUM CHLORIDE 0.9 % IV SOLN: Freq: Once | INTRAVENOUS | Status: AC

## 2019-12-26 MED ORDER — METHYLPREDNISOLONE SODIUM SUCC 125 MG IJ SOLR: 125.0000 mg | Freq: Once | INTRAMUSCULAR | Status: DC | PRN

## 2019-12-26 MED ORDER — FAMOTIDINE IN NACL 20-0.9 MG/50ML-% IV SOLN: 20.0000 mg | Freq: Once | INTRAVENOUS | Status: DC | PRN

## 2019-12-26 NOTE — [place] Notes (Signed)
  Diagnosis: COVID-19  Physician: Dr. Joya Gaskins  Procedure: Covid Infusion Clinic Med: bamlanivimab\etesevimab infusion - Provided patient with bamlanimivab\etesevimab fact sheet for patients, parents and caregivers prior to infusion.  Complications: No immediate complications noted.  Discharge: Discharged home   Richard Parrish 12/26/2019

## 2019-12-26 NOTE — Discharge Instructions (Signed)

## 2020-01-31 ENCOUNTER — Other Ambulatory Visit: Payer: Self-pay | Admitting: *Deleted

## 2020-01-31 ENCOUNTER — Telehealth: Payer: Self-pay | Admitting: *Deleted

## 2020-01-31 DIAGNOSIS — E7800 Pure hypercholesterolemia, unspecified: Secondary | ICD-10-CM

## 2020-01-31 NOTE — Telephone Encounter (Signed)
-----   Message from Loren Racer, RN sent at 02/18/2019  8:50 AM EST ----- Regarding: CMP and Lipids late Dec 2021 Needs CMP and Lipids in late Dec 2021

## 2020-01-31 NOTE — Telephone Encounter (Signed)
Left message for pt to call back and schedule fasting labs for mid to late December.  Orders in.  Appt for labs just needs to be scheduled.

## 2020-02-03 NOTE — Telephone Encounter (Signed)
Spoke with pt and scheduled him for labs on 12/22.  Pt appreciative for call.

## 2020-02-19 ENCOUNTER — Other Ambulatory Visit: Payer: Medicare Other | Admitting: *Deleted

## 2020-02-19 ENCOUNTER — Other Ambulatory Visit: Payer: Self-pay

## 2020-02-19 DIAGNOSIS — E7800 Pure hypercholesterolemia, unspecified: Secondary | ICD-10-CM

## 2020-02-19 LAB — LIPID PANEL
Chol/HDL Ratio: 2.9 ratio (ref 0.0–5.0)
Cholesterol, Total: 131 mg/dL (ref 100–199)
HDL: 45 mg/dL (ref >39)
LDL Chol Calc (NIH): 73 mg/dL (ref 0–99)
Triglycerides: 62 mg/dL (ref 0–149)
VLDL Cholesterol Cal: 13 mg/dL (ref 5–40)

## 2020-02-19 LAB — HEPATIC FUNCTION PANEL
ALT: 21 IU/L (ref 0–44)
AST: 24 IU/L (ref 0–40)
Albumin: 4.5 g/dL (ref 3.8–4.8)
Alkaline Phosphatase: 87 IU/L (ref 44–121)
Bilirubin Total: 0.4 mg/dL (ref 0.0–1.2)
Bilirubin, Direct: 0.13 mg/dL (ref 0.00–0.40)
Total Protein: 6.5 g/dL (ref 6.0–8.5)

## 2020-02-19 LAB — BASIC METABOLIC PANEL
BUN/Creatinine Ratio: 15 (ref 10–24)
BUN: 14 mg/dL (ref 8–27)
CO2: 25 mmol/L (ref 20–29)
Calcium: 9.5 mg/dL (ref 8.6–10.2)
Chloride: 103 mmol/L (ref 96–106)
Creatinine, Ser: 0.96 mg/dL (ref 0.76–1.27)
GFR calc Af Amer: 92 mL/min/{1.73_m2} (ref >59)
GFR calc non Af Amer: 80 mL/min/{1.73_m2} (ref >59)
Glucose: 102 mg/dL — ABNORMAL HIGH (ref 65–99)
Potassium: 4.3 mmol/L (ref 3.5–5.2)
Sodium: 140 mmol/L (ref 134–144)

## 2020-03-12 NOTE — Progress Notes (Signed)
CARDIOLOGY OFFICE NOTE  Date:  03/26/2020    Richard Parrish Date of Birth: January 17, 1951 Medical Record #[id number]  PCP:  Seward Carol, MD  Cardiologist:  Tamala Julian  Chief Complaint  Patient presents with  . Follow-up    Seen for Dr. Tamala Julian.     History of Present Illness: Richard Parrish is a 70 y.o. male who presents today for a one year check. Seen for Dr. Tamala Julian.   He has a known history of CAD with prior CABG to the RCA following failed PCI - has patent but diffusely diseased SVG to the RCA documented in 2012. Other issues include severe degenerative disc disease, depression, abdominal aortic aneurysm, PAD with prior left superficial femoral and popliteal athrectomy and stent placement, HLD and HTN.   Last seen in December of 2020 - doing ok. Did have bilateral buttocks burning with walking - resolved with rest. Otherwise, felt to be doing ok. Doppler study of the left was updated and stable - stent was patent with no evidence of restenosis.   Comes in today. Here alone. Did not have a good year - has had some orthopedic surgeries. Has had COVID back in October and then got pneumonia. He was late on his follow up here due to all this. He is better now. Notes his brain is "cloudy" since the COVID. Still some fatigue[facility] Trying to do better with his diet due to having lymphocytic colitis. No chest pain. Tolerating his medicines. Labs from PCP noted.   Past Medical History:  Diagnosis Date  . Anxiety   . Arthritis    "lower back" (04/07/2017)  . BPH (benign prostatic hypertrophy)   . C. difficile diarrhea   . CAD (coronary artery disease)   . Chronic lower back pain   . Coronary artery disease 1989   cabg with dvg to rca   . Depression   . GERD (gastroesophageal reflux disease)   . Hyperlipidemia   . Hypertension   . Insomnia   . Leg pain   . Myocardial infarction ([place]) 2002  . OSA on CPAP    cpap setting of 60 per pt  .  Osteoarthritis    "hips" (04/07/2017)  . PAD (peripheral artery disease) ([place])   . Peripheral vascular disease ([place])   . Pneumonia 1989   S/P CABG  . Pulmonary fibrosis ([place])    "one time" (04/07/2017)  . Spinal stenosis     Past Surgical History:  Procedure Laterality Date  . ABDOMINAL AORTAGRAM N/A 03/15/2011   Procedure: ABDOMINAL Maxcine Ham;  Surgeon: Serafina Mitchell, MD;  Location: [facility] C[facility] LAB;  Service: Cardiovascular;  Laterality: N/A;  . ABDOMINAL AORTAGRAM N/A 04/12/2011   Procedure: ABDOMINAL Maxcine Ham;  Surgeon: Serafina Mitchell, MD;  Location: [facility] CATH LAB;  Service: Cardiovascular;  Laterality: N/A;  . BACK SURGERY    . CARDIAC CATHETERIZATION  08/1987  . CORONARY ANGIOPLASTY WITH STENT PLACEMENT  ~ [facility], [place]  . CORONARY ARTERY BYPASS GRAFT  09/1987   "CABG X1";  [facility]; [place]; "RCA"  . JOINT REPLACEMENT    . KNEE ARTHROSCOPY Left   . LAPAROSCOPIC CHOLECYSTECTOMY    . PERIPHERAL VASCULAR INTERVENTION Left 2016   "put a stent; right branch of left femoral"  . POSTERIOR LUMBAR FUSION  2009   lumbar fusion, S L 5 to L 4  . SHOULDER ARTHROSCOPY W/ ROTATOR CUFF REPAIR Right 2006  .  SHOULDER ARTHROSCOPY WITH SUBACROMIAL DECOMPRESSION AND OPEN ROTATOR C Left ~ 1983  . TONSILLECTOMY AND ADENOIDECTOMY    . TOTAL HIP ARTHROPLASTY Right 2012  . TOTAL HIP ARTHROPLASTY Left 04/07/2017  . TOTAL HIP ARTHROPLASTY Left 04/07/2017   Procedure: LEFT TOTAL HIP ARTHROPLASTY;  Surgeon: Earlie Server, MD;  Location: [place];  Service: Orthopedics;  Laterality: Left;     Medications: Current Meds  Medication Sig  . acetaminophen (TYLENOL) 500 MG tablet Take 500 mg by mouth every 6 (six) hours as needed.  Marland Kitchen aspirin EC 81 MG tablet Take 81 mg by mouth daily.  Marland Kitchen atorvastatin (LIPITOR) 80 MG tablet TAKE ONE TABLET BY MOUTH DAILY  . budesonide (ENTOCORT EC) 3 MG 24 hr capsule Take 9 mg by mouth daily.  . cephALEXin (KEFLEX) 500 MG capsule Take 1 capsule (500  mg total) by mouth 4 (four) times daily.  . clopidogrel (PLAVIX) 75 MG tablet TAKE 1 TABLET BY MOUTH EVERY DAY  . erythromycin ophthalmic ointment Apply 1 cm long ribbon to lower right eyelid every 6 hours for 3-5 days until healed.  . Glucosamine HCl-MSM (GLUCOSAMINE-MSM PO) Take 1 tablet by mouth every other day.  Marland Kitchen HYDROcodone-acetaminophen (NORCO/VICODIN) 5-325 MG tablet Take 1 tablet by mouth 5 (five) times daily.   . hyoscyamine (LEVSIN, ANASPAZ) 0.125 MG tablet Take 0.125 mg by mouth every 4 (four) hours as needed for cramping.   . methocarbamol (ROBAXIN) 750 MG tablet Take 750 mg by mouth 3 (three) times daily.  . Methylsulfonylmethane 1000 MG TABS Take by mouth.  . metoprolol succinate (TOPROL-XL) 50 MG 24 hr tablet TAKE 1 TABLET BY MOUTH EVERY DAY AFTER MEAL  . Multiple Vitamin (MULTIVITAMIN) tablet Take 1 tablet by mouth every other day.   . nitroGLYCERIN (NITROSTAT) 0.4 MG SL tablet Place 0.4 mg under the tongue every 5 (five) minutes as needed for chest pain.  . Omega-3 Fatty Acids (FISH OIL) 1000 MG CAPS Take 1,000 mg by mouth every other day.   Marland Kitchen omeprazole (PRILOSEC OTC) 20 MG tablet Take 20 mg by mouth daily before breakfast.  . PARoxetine (PAXIL) 40 MG tablet Take 40 mg by mouth daily.  . Probiotic Product (PROBIOTIC PO) Take 1 capsule by mouth daily.  . tamsulosin (FLOMAX) 0.4 MG CAPS capsule Take 1 capsule by mouth every morning.  . traZODone (DESYREL) 50 MG tablet Take 50 mg by mouth at bedtime.  . valsartan (DIOVAN) 320 MG tablet Take 1 tablet (320 mg total) by mouth daily. Please make overdue appt with Dr. Tamala Julian before anymore refills. Thank you 1st attempt     Allergies: Allergies  Allergen Reactions  . Ambien [Zolpidem Tartrate] Other (See Comments)    Bad dreams/Vivid Dreams  . Zolpidem Other (See Comments)    Bad dreams/Vivid Dreams    Social History: The patient  reports that he quit smoking about 33 years ago. His smoking use included cigarettes. He has a  30.00 pack-year smoking history. He has never used smokeless tobacco. He reports current alcohol use. He reports that he does not use drugs.   Family History: The patient's family history includes Cancer in his father; Heart disease in his mother.   Review of Systems: Please see the history of present illness.   All other systems are reviewed and negative.   Physical Exam: VS:  BP 116/72   Pulse 61   Ht 6' (1.829 m)   Wt 231 lb 3.2 oz (104.9 kg)   SpO2 96%   BMI 31.36  kg/m  .  BMI Body mass index is 31.36 kg/m.  Wt Readings from Last 3 Encounters:  03/26/20 231 lb 3.2 oz (104.9 kg)  02/20/19 227 lb (103 kg)  08/24/18 212 lb (96.2 kg)    General: Alert and in no acute distress.   Cardiac: Regular rate and rhythm. No murmurs, rubs, or gallops. No edema.  Respiratory:  Lungs are clear to auscultation bilaterally with normal work of breathing.  GI: Soft and nontender.  MS: No deformity or atrophy. Gait and ROM intact.  Skin: Warm and dry. Color is normal.  Neuro:  Strength and sensation are intact and no gross focal deficits noted.  Psych: Alert, appropriate and with normal affect.   LABORATORY DATA:  EKG:  EKG is ordered today.  Personally reviewed by me. This demonstrates NSR.  Lab Results  Component Value Date   WBC 11.0 (H) 04/10/2017   HGB 10.7 (L) 04/10/2017   HCT 31.5 (L) 04/10/2017   PLT 255 04/10/2017   GLUCOSE 102 (H) 02/19/2020   CHOL 131 02/19/2020   TRIG 62 02/19/2020   HDL 45 02/19/2020   LDLCALC 73 02/19/2020   ALT 21 02/19/2020   AST 24 02/19/2020   NA 140 02/19/2020   K 4.3 02/19/2020   CL 103 02/19/2020   CREATININE 0.96 02/19/2020   BUN 14 02/19/2020   CO2 25 02/19/2020   INR 1.00 03/24/2017   HGBA1C 5.1 01/08/2018       BNP (last 3 results) No results for input(s): BNP in the last 8760 hours.  ProBNP (last 3 results) No results for input(s): PROBNP in the last 8760 hours.   Other Studies Reviewed Today:      ASSESSMENT &  PLAN:     1. CAD - prior CABG to the RCA - this graft is severely diseased but patent by cath in 2012 - he is doing well with no worrisome symptoms. No changes made today.   2. HTN - BP looks good on his current regimen.   3. PAD with claudication - he says his pain in his buttocks is now resolved - most likely from his degenerative disc disease.   4. HLD - on statin - labs noted by PCP.   5. Prior COVID 19  6. Obesity - getting back to diet/weight loss and exercise.   Current medicines are reviewed with the patient today.  The patient does not have concerns regarding medicines other than what has been noted above.  The following changes have been made:  See above.  Labs/ tests ordered today include:    Orders Placed This Encounter  Procedures  . EKG 12-Lead     Disposition:   FU with Dr. Tamala Julian in one year.    Patient is agreeable to this plan and will call if any problems develop in the interim.   SignedTruitt Merle, NP  03/26/2020 1:33 PM  [facility] [address] [place], [place]  [address] Phone: [phone] Fax: [phone]

## 2020-03-15 ENCOUNTER — Other Ambulatory Visit: Payer: Self-pay | Admitting: Interventional Cardiology

## 2020-03-23 ENCOUNTER — Other Ambulatory Visit: Payer: Self-pay | Admitting: Interventional Cardiology

## 2020-03-23 DIAGNOSIS — K52832 Lymphocytic colitis: Secondary | ICD-10-CM | POA: Diagnosis not present

## 2020-03-25 DIAGNOSIS — F324 Major depressive disorder, single episode, in partial remission: Secondary | ICD-10-CM | POA: Diagnosis not present

## 2020-03-25 DIAGNOSIS — G4700 Insomnia, unspecified: Secondary | ICD-10-CM | POA: Diagnosis not present

## 2020-03-25 DIAGNOSIS — F329 Major depressive disorder, single episode, unspecified: Secondary | ICD-10-CM | POA: Diagnosis not present

## 2020-03-25 DIAGNOSIS — E782 Mixed hyperlipidemia: Secondary | ICD-10-CM | POA: Diagnosis not present

## 2020-03-25 DIAGNOSIS — E7800 Pure hypercholesterolemia, unspecified: Secondary | ICD-10-CM | POA: Diagnosis not present

## 2020-03-25 DIAGNOSIS — K219 Gastro-esophageal reflux disease without esophagitis: Secondary | ICD-10-CM | POA: Diagnosis not present

## 2020-03-25 DIAGNOSIS — I2510 Atherosclerotic heart disease of native coronary artery without angina pectoris: Secondary | ICD-10-CM | POA: Diagnosis not present

## 2020-03-25 DIAGNOSIS — I10 Essential (primary) hypertension: Secondary | ICD-10-CM | POA: Diagnosis not present

## 2020-03-26 ENCOUNTER — Other Ambulatory Visit: Payer: Self-pay

## 2020-03-26 ENCOUNTER — Encounter: Payer: Self-pay | Admitting: Nurse Practitioner

## 2020-03-26 ENCOUNTER — Ambulatory Visit (INDEPENDENT_AMBULATORY_CARE_PROVIDER_SITE_OTHER): Payer: Medicare Other | Admitting: Nurse Practitioner

## 2020-03-26 VITALS — BP 116/72 | HR 61 | Ht 72.0 in | Wt 231.2 lb

## 2020-03-26 DIAGNOSIS — E7800 Pure hypercholesterolemia, unspecified: Secondary | ICD-10-CM

## 2020-03-26 DIAGNOSIS — I25118 Atherosclerotic heart disease of native coronary artery with other forms of angina pectoris: Secondary | ICD-10-CM | POA: Diagnosis not present

## 2020-03-26 DIAGNOSIS — I739 Peripheral vascular disease, unspecified: Secondary | ICD-10-CM | POA: Diagnosis not present

## 2020-03-26 DIAGNOSIS — I10 Essential (primary) hypertension: Secondary | ICD-10-CM | POA: Diagnosis not present

## 2020-03-26 NOTE — Patient Instructions (Addendum)
After Visit Summary:  We will be checking the following labs today - NONE   Medication Instructions:    Continue with your current medicines.    If you need a refill on your cardiac medications before your next appointment, please call your pharmacy.     Testing/Procedures To Be Arranged:  N/A  Follow-Up:   See Dr. Tamala Julian in 4 to 6 months     At [facility], you and your health needs are our priority.  As part of our continuing mission to provide you with exceptional heart care, we have created designated Provider Care Teams.  These Care Teams include your primary Cardiologist (physician) and Advanced Practice Providers (APPs -  Physician Assistants and Nurse Practitioners) who all work together to provide you with the care you need, when you need it.  Special Instructions:  . Stay safe, wash your hands for at least 20 seconds and wear a mask when needed.  . It was good to talk with you today.    Call the [place] office at [phone] if you have any questions, problems or concerns.

## 2020-03-30 DIAGNOSIS — G894 Chronic pain syndrome: Secondary | ICD-10-CM | POA: Diagnosis not present

## 2020-03-30 DIAGNOSIS — M47817 Spondylosis without myelopathy or radiculopathy, lumbosacral region: Secondary | ICD-10-CM | POA: Diagnosis not present

## 2020-03-30 DIAGNOSIS — M545 Low back pain, unspecified: Secondary | ICD-10-CM | POA: Diagnosis not present

## 2020-04-09 DIAGNOSIS — M791 Myalgia, unspecified site: Secondary | ICD-10-CM | POA: Diagnosis not present

## 2020-04-09 DIAGNOSIS — M545 Low back pain, unspecified: Secondary | ICD-10-CM | POA: Diagnosis not present

## 2020-04-09 DIAGNOSIS — M47817 Spondylosis without myelopathy or radiculopathy, lumbosacral region: Secondary | ICD-10-CM | POA: Diagnosis not present

## 2020-04-09 DIAGNOSIS — G894 Chronic pain syndrome: Secondary | ICD-10-CM | POA: Diagnosis not present

## 2020-04-09 DIAGNOSIS — Z6829 Body mass index (BMI) 29.0-29.9, adult: Secondary | ICD-10-CM | POA: Diagnosis not present

## 2020-04-15 ENCOUNTER — Ambulatory Visit (HOSPITAL_COMMUNITY)
Admission: RE | Admit: 2020-04-15 | Payer: Medicare Other | Source: Ambulatory Visit | Attending: Interventional Cardiology | Admitting: Interventional Cardiology

## 2020-04-17 ENCOUNTER — Other Ambulatory Visit: Payer: Self-pay | Admitting: Interventional Cardiology

## 2020-04-23 ENCOUNTER — Other Ambulatory Visit: Payer: Self-pay

## 2020-04-23 ENCOUNTER — Other Ambulatory Visit: Payer: Self-pay | Admitting: Interventional Cardiology

## 2020-04-23 ENCOUNTER — Ambulatory Visit (HOSPITAL_COMMUNITY)
Admission: RE | Admit: 2020-04-23 | Discharge: 2020-04-23 | Disposition: A | Discharge status: Home / Self Care | Payer: Medicare Other | Source: Ambulatory Visit | Attending: Cardiovascular Disease | Admitting: Cardiovascular Disease

## 2020-04-23 DIAGNOSIS — I739 Peripheral vascular disease, unspecified: Secondary | ICD-10-CM

## 2020-04-23 DIAGNOSIS — M545 Low back pain, unspecified: Secondary | ICD-10-CM | POA: Diagnosis not present

## 2020-04-23 DIAGNOSIS — M791 Myalgia, unspecified site: Secondary | ICD-10-CM | POA: Diagnosis not present

## 2020-04-23 DIAGNOSIS — M47817 Spondylosis without myelopathy or radiculopathy, lumbosacral region: Secondary | ICD-10-CM | POA: Diagnosis not present

## 2020-04-23 DIAGNOSIS — G894 Chronic pain syndrome: Secondary | ICD-10-CM | POA: Diagnosis not present

## 2020-04-30 ENCOUNTER — Ambulatory Visit: Payer: Self-pay

## 2020-04-30 DIAGNOSIS — R0781 Pleurodynia: Secondary | ICD-10-CM | POA: Diagnosis not present

## 2020-04-30 DIAGNOSIS — W19XXXA Unspecified fall, initial encounter: Secondary | ICD-10-CM | POA: Diagnosis not present

## 2020-04-30 DIAGNOSIS — S20212A Contusion of left front wall of thorax, initial encounter: Secondary | ICD-10-CM | POA: Diagnosis not present

## 2020-05-05 ENCOUNTER — Ambulatory Visit (INDEPENDENT_AMBULATORY_CARE_PROVIDER_SITE_OTHER): Payer: Medicare Other | Admitting: Sports Medicine

## 2020-05-05 ENCOUNTER — Other Ambulatory Visit: Payer: Self-pay

## 2020-05-05 ENCOUNTER — Ambulatory Visit: Payer: Medicare Other | Admitting: Sports Medicine

## 2020-05-05 VITALS — BP 114/88 | Ht 72.0 in | Wt 225.0 lb

## 2020-05-05 DIAGNOSIS — R0781 Pleurodynia: Secondary | ICD-10-CM

## 2020-05-05 DIAGNOSIS — I10 Essential (primary) hypertension: Secondary | ICD-10-CM | POA: Diagnosis not present

## 2020-05-05 DIAGNOSIS — G894 Chronic pain syndrome: Secondary | ICD-10-CM | POA: Diagnosis not present

## 2020-05-05 DIAGNOSIS — F324 Major depressive disorder, single episode, in partial remission: Secondary | ICD-10-CM | POA: Diagnosis not present

## 2020-05-05 DIAGNOSIS — I2510 Atherosclerotic heart disease of native coronary artery without angina pectoris: Secondary | ICD-10-CM | POA: Diagnosis not present

## 2020-05-05 DIAGNOSIS — E7800 Pure hypercholesterolemia, unspecified: Secondary | ICD-10-CM | POA: Diagnosis not present

## 2020-05-05 DIAGNOSIS — Z125 Encounter for screening for malignant neoplasm of prostate: Secondary | ICD-10-CM | POA: Diagnosis not present

## 2020-05-05 DIAGNOSIS — I739 Peripheral vascular disease, unspecified: Secondary | ICD-10-CM | POA: Diagnosis not present

## 2020-05-05 DIAGNOSIS — I25118 Atherosclerotic heart disease of native coronary artery with other forms of angina pectoris: Secondary | ICD-10-CM

## 2020-05-05 DIAGNOSIS — J8410 Pulmonary fibrosis, unspecified: Secondary | ICD-10-CM | POA: Diagnosis not present

## 2020-05-05 DIAGNOSIS — F419 Anxiety disorder, unspecified: Secondary | ICD-10-CM | POA: Diagnosis not present

## 2020-05-05 DIAGNOSIS — G4700 Insomnia, unspecified: Secondary | ICD-10-CM | POA: Diagnosis not present

## 2020-05-05 DIAGNOSIS — K52832 Lymphocytic colitis: Secondary | ICD-10-CM | POA: Diagnosis not present

## 2020-05-05 DIAGNOSIS — Z0000 Encounter for general adult medical examination without abnormal findings: Secondary | ICD-10-CM | POA: Diagnosis not present

## 2020-05-05 NOTE — Patient Instructions (Signed)
It was good seeing you today Richard Parrish  I believe that you probably did crack a rib even though your x-rays are normal. We will give you a rib belt to try.  Also remember to take several deep breaths throughout the day to prevent yourself from getting a lung infection.  Use your incentive spirometer if you still have it. Continue with your pain medicine the way you have been taking it and schedule a follow-up appointment with me in 3 weeks.  If you are feeling better, you can cancel that appointment.

## 2020-05-06 ENCOUNTER — Encounter: Payer: Self-pay | Admitting: Sports Medicine

## 2020-05-06 NOTE — Progress Notes (Signed)
° °  Subjective:    Patient ID: Richard Parrish, male    DOB: [birth year], 70 y.o.   MRN: [id number]  HPI chief complaint: Left-sided rib pain  Very pleasant 70 year old male comes in today complaining of posterior left-sided rib pain that began after he fell about a week ago.  He landed directly on his left side.  He had immediate pain and was seen at a local urgent care.  He brings an x-ray report with him today which reads no obvious rib fracture.  His pain is most noticeable with deep breathing or with direct pressure over the area.  He is also experiencing some popping in the area.  He takes Norco and methocarbamol chronically and has started taking Tylenol as well.  Past medical history reviewed Medications reviewed Allergies reviewed    Review of Systems As above    Objective:   Physical Exam  Well-developed, well-nourished.  No acute distress.  Sits comfortably in the exam room with unlabored breathing.  Examination of his rib cage shows diffuse tenderness to palpation along the left posterior ribs, primarily in the midportion of the rib cage.  There is some mild soft tissue swelling here.  I do not see any significant ecchymosis.  Pain with anterior to posterior rib compression.  Reproducible pain with deep breathing.      Assessment & Plan:   Right-sided rib pain likely secondary to occult rib fracture  I explained to Richard Parrish that x-rays will oftentimes miss a small fracture initially.  His clinical presentation is consistent with probable rib fracture.  We will provide him with a rib belt and I have encouraged him to take multiple deep breaths each day to help prevent pneumonia and atelectasis.  He has an incentive spirometer at home and I have encouraged him to use this.  He will continue on his chronic pain medicine and continue with Tylenol as well.  Reassurance that symptoms should slowly improve over the next 4 to 6 weeks.  We will schedule a tentative follow-up in 3 weeks  which Richard Parrish may feel free to cancel if he is doing well.

## 2020-05-12 DIAGNOSIS — F329 Major depressive disorder, single episode, unspecified: Secondary | ICD-10-CM | POA: Diagnosis not present

## 2020-05-12 DIAGNOSIS — I10 Essential (primary) hypertension: Secondary | ICD-10-CM | POA: Diagnosis not present

## 2020-05-12 DIAGNOSIS — E782 Mixed hyperlipidemia: Secondary | ICD-10-CM | POA: Diagnosis not present

## 2020-05-12 DIAGNOSIS — G4700 Insomnia, unspecified: Secondary | ICD-10-CM | POA: Diagnosis not present

## 2020-05-12 DIAGNOSIS — I2510 Atherosclerotic heart disease of native coronary artery without angina pectoris: Secondary | ICD-10-CM | POA: Diagnosis not present

## 2020-05-12 DIAGNOSIS — E7800 Pure hypercholesterolemia, unspecified: Secondary | ICD-10-CM | POA: Diagnosis not present

## 2020-05-12 DIAGNOSIS — K219 Gastro-esophageal reflux disease without esophagitis: Secondary | ICD-10-CM | POA: Diagnosis not present

## 2020-05-13 DIAGNOSIS — M545 Low back pain, unspecified: Secondary | ICD-10-CM | POA: Diagnosis not present

## 2020-05-13 DIAGNOSIS — G894 Chronic pain syndrome: Secondary | ICD-10-CM | POA: Diagnosis not present

## 2020-05-13 DIAGNOSIS — M791 Myalgia, unspecified site: Secondary | ICD-10-CM | POA: Diagnosis not present

## 2020-05-13 DIAGNOSIS — M47817 Spondylosis without myelopathy or radiculopathy, lumbosacral region: Secondary | ICD-10-CM | POA: Diagnosis not present

## 2020-06-02 ENCOUNTER — Ambulatory Visit: Payer: Medicare Other | Admitting: Sports Medicine

## 2020-06-22 DIAGNOSIS — M545 Low back pain, unspecified: Secondary | ICD-10-CM | POA: Diagnosis not present

## 2020-06-22 DIAGNOSIS — Z79891 Long term (current) use of opiate analgesic: Secondary | ICD-10-CM | POA: Diagnosis not present

## 2020-06-22 DIAGNOSIS — G894 Chronic pain syndrome: Secondary | ICD-10-CM | POA: Diagnosis not present

## 2020-06-22 DIAGNOSIS — Z79899 Other long term (current) drug therapy: Secondary | ICD-10-CM | POA: Diagnosis not present

## 2020-06-22 DIAGNOSIS — M47817 Spondylosis without myelopathy or radiculopathy, lumbosacral region: Secondary | ICD-10-CM | POA: Diagnosis not present

## 2020-06-27 ENCOUNTER — Other Ambulatory Visit: Payer: Self-pay | Admitting: Vascular Surgery

## 2020-06-29 ENCOUNTER — Other Ambulatory Visit: Payer: Self-pay

## 2020-06-29 MED ORDER — CLOPIDOGREL BISULFATE 75 MG PO TABS: 75.0000 mg | ORAL_TABLET | Freq: Every day | ORAL | 0 refills | Status: DC

## 2020-07-08 ENCOUNTER — Other Ambulatory Visit: Payer: Self-pay

## 2020-07-08 DIAGNOSIS — I739 Peripheral vascular disease, unspecified: Secondary | ICD-10-CM

## 2020-07-10 ENCOUNTER — Other Ambulatory Visit: Payer: Self-pay | Admitting: Interventional Cardiology

## 2020-07-16 DIAGNOSIS — M47817 Spondylosis without myelopathy or radiculopathy, lumbosacral region: Secondary | ICD-10-CM | POA: Diagnosis not present

## 2020-07-16 DIAGNOSIS — M545 Low back pain, unspecified: Secondary | ICD-10-CM | POA: Diagnosis not present

## 2020-07-16 DIAGNOSIS — G894 Chronic pain syndrome: Secondary | ICD-10-CM | POA: Diagnosis not present

## 2020-07-24 ENCOUNTER — Ambulatory Visit: Payer: Medicare Other | Admitting: Interventional Cardiology

## 2020-07-24 DIAGNOSIS — I2510 Atherosclerotic heart disease of native coronary artery without angina pectoris: Secondary | ICD-10-CM | POA: Diagnosis not present

## 2020-07-24 DIAGNOSIS — K219 Gastro-esophageal reflux disease without esophagitis: Secondary | ICD-10-CM | POA: Diagnosis not present

## 2020-07-24 DIAGNOSIS — I10 Essential (primary) hypertension: Secondary | ICD-10-CM | POA: Diagnosis not present

## 2020-07-24 DIAGNOSIS — E7800 Pure hypercholesterolemia, unspecified: Secondary | ICD-10-CM | POA: Diagnosis not present

## 2020-07-24 DIAGNOSIS — G4700 Insomnia, unspecified: Secondary | ICD-10-CM | POA: Diagnosis not present

## 2020-07-24 DIAGNOSIS — F329 Major depressive disorder, single episode, unspecified: Secondary | ICD-10-CM | POA: Diagnosis not present

## 2020-07-24 DIAGNOSIS — E782 Mixed hyperlipidemia: Secondary | ICD-10-CM | POA: Diagnosis not present

## 2020-07-24 DIAGNOSIS — F324 Major depressive disorder, single episode, in partial remission: Secondary | ICD-10-CM | POA: Diagnosis not present

## 2020-07-27 ENCOUNTER — Other Ambulatory Visit: Payer: Self-pay | Admitting: Vascular Surgery

## 2020-08-02 NOTE — Progress Notes (Signed)
Cardiology Office Note:    Date:  08/03/2020   ID:  Richard Parrish, DOB 01/04/1951, MRN [id number]  PCP:  Seward Carol, MD  Cardiologist:  Sinclair Grooms, MD   Referring MD: Seward Carol, MD   Chief Complaint  Patient presents with  . Coronary Artery Disease  . Hypertension    History of Present Illness:    CADAN Richard Parrish is a 70 y.o. male with a hx of CAD, prior bypass graft to RCA for failed angioplasty , patent but diffusely diseased SVG to RCA document by catheterization 2012 , severe degenerative disc disease, depression, abdominal aortic aneurysm ,PAD with prior left superficial femoral and popliteal atherectomy in-stent, and hypertension.   He is not having angina.  He still plays golf twice per week.  She has 78 the last time he played.  Plays with Dr. Raliegh Ip. at [facility].  There is no peripheral edema.  He has not had neurological complaints.  He denies claudication and gets in a fair amount of walking on the days that he plays golf.      Past Medical History:  Diagnosis Date  . Anxiety   . Arthritis    "lower back" (04/07/2017)  . BPH (benign prostatic hypertrophy)   . C. difficile diarrhea   . CAD (coronary artery disease)   . Chronic lower back pain   . Coronary artery disease 1989   cabg with dvg to rca   . Depression   . GERD (gastroesophageal reflux disease)   . Hyperlipidemia   . Hypertension   . Insomnia   . Leg pain   . Myocardial infarction ([place]) 2002  . OSA on CPAP    cpap setting of 60 per pt  . Osteoarthritis    "hips" (04/07/2017)  . PAD (peripheral artery disease) ([place])   . Peripheral vascular disease ([place])   . Pneumonia 1989   S/P CABG  . Pulmonary fibrosis ([place])    "one time" (04/07/2017)  . Spinal stenosis     Past Surgical History:  Procedure Laterality Date  . ABDOMINAL AORTAGRAM N/A 03/15/2011   Procedure: ABDOMINAL Maxcine Ham;  Surgeon: Serafina Mitchell, MD;  Location: [facility] CATH LAB;  Service: Cardiovascular;  Laterality:  N/A;  . ABDOMINAL AORTAGRAM N/A 04/12/2011   Procedure: ABDOMINAL Maxcine Ham;  Surgeon: Serafina Mitchell, MD;  Location: [facility] CATH LAB;  Service: Cardiovascular;  Laterality: N/A;  . BACK SURGERY    . CARDIAC CATHETERIZATION  08/1987  . CORONARY ANGIOPLASTY WITH STENT PLACEMENT  ~ [facility], [place]  . CORONARY ARTERY BYPASS GRAFT  09/1987   "CABG X1";  [facility]; [place]; "RCA"  . JOINT REPLACEMENT    . KNEE ARTHROSCOPY Left   . LAPAROSCOPIC CHOLECYSTECTOMY    . PERIPHERAL VASCULAR INTERVENTION Left 2016   "put a stent; right branch of left femoral"  . POSTERIOR LUMBAR FUSION  2009   lumbar fusion, S L 5 to L 4  . SHOULDER ARTHROSCOPY W/ ROTATOR CUFF REPAIR Right 2006  . SHOULDER ARTHROSCOPY WITH SUBACROMIAL DECOMPRESSION AND OPEN ROTATOR C Left ~ 1983  . TONSILLECTOMY AND ADENOIDECTOMY    . TOTAL HIP ARTHROPLASTY Right 2012  . TOTAL HIP ARTHROPLASTY Left 04/07/2017  . TOTAL HIP ARTHROPLASTY Left 04/07/2017   Procedure: LEFT TOTAL HIP ARTHROPLASTY;  Surgeon: Earlie Server, MD;  Location: [place];  Service: Orthopedics;  Laterality: Left;    Current Medications: Current Meds  Medication Sig  . acetaminophen (TYLENOL) 500 MG  tablet Take 500 mg by mouth every 6 (six) hours as needed.  Marland Kitchen aspirin EC 81 MG tablet Take 81 mg by mouth daily.  Marland Kitchen atorvastatin (LIPITOR) 80 MG tablet TAKE 1 TABLET BY MOUTH EVERY DAY  . budesonide (ENTOCORT EC) 3 MG 24 hr capsule Take 9 mg by mouth daily.  . clopidogrel (PLAVIX) 75 MG tablet TAKE 1 TABLET BY MOUTH EVERY DAY  . Glucosamine HCl-MSM (GLUCOSAMINE-MSM PO) Take 1 tablet by mouth every other day.  Marland Kitchen HYDROcodone-acetaminophen (NORCO/VICODIN) 5-325 MG tablet Take 1 tablet by mouth 5 (five) times daily.   . hyoscyamine (LEVSIN, ANASPAZ) 0.125 MG tablet Take 0.125 mg by mouth every 4 (four) hours as needed for cramping.   . metaxalone (SKELAXIN) 800 MG tablet Take 800 mg by mouth 3 (three) times daily.  . Methylsulfonylmethane 1000 MG  TABS Take by mouth.  . metoprolol succinate (TOPROL-XL) 50 MG 24 hr tablet TAKE 1 TABLET BY MOUTH EVERY DAY AFTER MEAL  . Multiple Vitamin (MULTIVITAMIN) tablet Take 1 tablet by mouth every other day.   . nitroGLYCERIN (NITROSTAT) 0.4 MG SL tablet Place 0.4 mg under the tongue every 5 (five) minutes as needed for chest pain.  . Omega-3 Fatty Acids (FISH OIL) 1000 MG CAPS Take 1,000 mg by mouth every other day.   Marland Kitchen omeprazole (PRILOSEC OTC) 20 MG tablet Take 20 mg by mouth daily before breakfast.  . PARoxetine (PAXIL) 40 MG tablet Take 40 mg by mouth daily.  . Probiotic Product (PROBIOTIC PO) Take 1 capsule by mouth daily.  . tamsulosin (FLOMAX) 0.4 MG CAPS capsule Take 1 capsule by mouth every morning.  . traZODone (DESYREL) 50 MG tablet Take 50 mg by mouth at bedtime.  . valsartan (DIOVAN) 320 MG tablet Take 1 tablet (320 mg total) by mouth daily.     Allergies:   Ambien [zolpidem tartrate] and Zolpidem   Social History   Socioeconomic History  . Marital status: Married    Spouse name: Not on file  . Number of children: Not on file  . Years of education: Not on file  . Highest education level: Not on file  Occupational History  . Occupation: Retired  Tobacco Use  . Smoking status: Former Smoker    Packs/day: 2.00    Years: 15.00    Pack years: 30.00    Types: Cigarettes    Quit date: 03/01/1987    Years since quitting: 33.4  . Smokeless tobacco: Never Used  Vaping Use  . Vaping Use: Never used  Substance and Sexual Activity  . Alcohol use: Yes    Comment: 04/07/2017 'couple drinks/month"  . Drug use: No  . Sexual activity: Not Currently  Other Topics Concern  . Not on file  Social History Narrative   ** Merged History Encounter **       Social Determinants of Health   Financial Resource Strain: Not on file  Food Insecurity: Not on file  Transportation Needs: Not on file  Physical Activity: Not on file  Stress: Not on file  Social Connections: Not on file      Family History: The patient's family history includes Cancer in his father; Heart disease in his mother.  ROS:   Please see the history of present illness.    Significant chronic back and leg pain.  Has undergone spinal fusion and hip replacement surgery.  All other systems reviewed and are negative.  EKGs/Labs/Other Studies Reviewed:    The following studies were reviewed today: No new  or recent imaging studies.  EKG:  EKG done March 26, 2020 demonstrates with horizontal axis.  No acute changes are noted.   No change when compared to prior  Recent Labs: 02/19/2020: ALT 21; BUN 14; Creatinine, Ser 0.96; Potassium 4.3; Sodium 140  Recent Lipid Panel    Component Value Date/Time   CHOL 131 02/19/2020 0820   TRIG 62 02/19/2020 0820   HDL 45 02/19/2020 0820   CHOLHDL 2.9 02/19/2020 0820   CHOLHDL 2.7 09/18/2015 0959   VLDL 10 09/18/2015 0959   LDLCALC 73 02/19/2020 0820    Physical Exam:    VS:  BP 120/70 (BP Location: Left Arm, Patient Position: Sitting, Cuff Size: Normal)   Pulse 71   Ht 6' (1.829 m)   Wt 229 lb (103.9 kg)   SpO2 96%   BMI 31.06 kg/m     Wt Readings from Last 3 Encounters:  08/03/20 229 lb (103.9 kg)  05/05/20 225 lb (102.1 kg)  03/26/20 231 lb 3.2 oz (104.9 kg)     GEN: Moderate obesity. No acute distress HEENT: Normal NECK: No JVD. LYMPHATICS: No lymphadenopathy CARDIAC: No murmur. RRR no gallop, or edema. VASCULAR:  Normal Pulses. No bruits. RESPIRATORY:  Clear to auscultation without rales, wheezing or rhonchi  ABDOMEN: Soft, non-tender, non-distended, No pulsatile mass, MUSCULOSKELETAL: No deformity  SKIN: Warm and dry NEUROLOGIC:  Alert and oriented x 3 PSYCHIATRIC:  Normal affect   ASSESSMENT:    1. Coronary artery disease of native artery of native heart with stable angina pectoris ([place])   2. PAD (peripheral artery disease) ([facility])   3. Pure hypercholesterolemia   4. Essential hypertension, benign    PLAN:    In order of  problems listed above:  1. Taking her prevention reviewed. 2. He denies claudication. 3. LDL lowering is suboptimal.  Needs to be less than 70.  Suggested adding Zetia but he wants to try lifestyle change. 4. Blood pressure is under great control.  Low-salt diet.  Continue medications as listed.  Overall education and awareness concerning primary/secondary risk prevention was discussed in detail: LDL less than 70, hemoglobin A1c less than 7, blood pressure target less than 130/80 mmHg, >150 minutes of moderate aerobic activity per week, avoidance of smoking, weight control (via diet and exercise), and continued surveillance/management of/for obstructive sleep apnea.    Medication Adjustments/Labs and Tests Ordered: Current medicines are reviewed at length with the patient today.  Concerns regarding medicines are outlined above.  No orders of the defined types were placed in this encounter.  No orders of the defined types were placed in this encounter.   Patient Instructions  Medication Instructions:  Your physician recommends that you continue on your current medications as directed. Please refer to the Current Medication list given to you today.  *If you need a refill on your cardiac medications before your next appointment, please call your pharmacy*   Lab Work: None If you have labs (blood work) drawn today and your tests are completely normal, you will receive your results only by: Marland Kitchen MyChart Message (if you have MyChart) OR . A paper copy in the mail If you have any lab test that is abnormal or we need to change your treatment, we will call you to review the results.   Testing/Procedures: None   Follow-Up: At [facility], you and your health needs are our priority.  As part of our continuing mission to provide you with exceptional heart care, we have created designated Provider Care  Teams.  These Care Teams include your primary Cardiologist (physician) and Advanced  Practice Providers (APPs -  Physician Assistants and Nurse Practitioners) who all work together to provide you with the care you need, when you need it.  We recommend signing up for the patient portal called "MyChart".  Sign up information is provided on this After Visit Summary.  MyChart is used to connect with patients for Virtual Visits (Telemedicine).  Patients are able to view lab/test results, encounter notes, upcoming appointments, etc.  Non-urgent messages can be sent to your provider as well.   To learn more about what you can do with MyChart, go to [email or web address].    Your next appointment:   1 year(s)  The format for your next appointment:   In Person  Provider:   You may see Sinclair Grooms, MD or one of the following Advanced Practice Providers on your designated Care Team:    Kathyrn Drown, NP    Other Instructions      Signed, Sinclair Grooms, MD  08/03/2020 2:58 PM    [place]

## 2020-08-03 ENCOUNTER — Encounter: Payer: Self-pay | Admitting: Interventional Cardiology

## 2020-08-03 ENCOUNTER — Ambulatory Visit (INDEPENDENT_AMBULATORY_CARE_PROVIDER_SITE_OTHER): Payer: Medicare Other | Admitting: Interventional Cardiology

## 2020-08-03 ENCOUNTER — Other Ambulatory Visit: Payer: Self-pay

## 2020-08-03 VITALS — BP 120/70 | HR 71 | Ht 72.0 in | Wt 229.0 lb

## 2020-08-03 DIAGNOSIS — I10 Essential (primary) hypertension: Secondary | ICD-10-CM

## 2020-08-03 DIAGNOSIS — E7800 Pure hypercholesterolemia, unspecified: Secondary | ICD-10-CM | POA: Diagnosis not present

## 2020-08-03 DIAGNOSIS — I25118 Atherosclerotic heart disease of native coronary artery with other forms of angina pectoris: Secondary | ICD-10-CM | POA: Diagnosis not present

## 2020-08-03 DIAGNOSIS — I739 Peripheral vascular disease, unspecified: Secondary | ICD-10-CM | POA: Diagnosis not present

## 2020-08-03 NOTE — Patient Instructions (Signed)

## 2020-08-10 ENCOUNTER — Other Ambulatory Visit: Payer: Self-pay

## 2020-08-10 ENCOUNTER — Ambulatory Visit (HOSPITAL_COMMUNITY)
Admission: RE | Admit: 2020-08-10 | Discharge: 2020-08-10 | Disposition: A | Discharge status: Home / Self Care | Payer: Medicare Other | Source: Ambulatory Visit | Attending: Surgery | Admitting: Surgery

## 2020-08-10 ENCOUNTER — Ambulatory Visit (INDEPENDENT_AMBULATORY_CARE_PROVIDER_SITE_OTHER): Payer: Medicare Other | Admitting: Surgery

## 2020-08-10 ENCOUNTER — Encounter: Payer: Self-pay | Admitting: Surgery

## 2020-08-10 ENCOUNTER — Ambulatory Visit (INDEPENDENT_AMBULATORY_CARE_PROVIDER_SITE_OTHER)
Admission: RE | Admit: 2020-08-10 | Discharge: 2020-08-10 | Disposition: A | Discharge status: Home / Self Care | Payer: Medicare Other | Source: Ambulatory Visit | Attending: Surgery | Admitting: Surgery

## 2020-08-10 VITALS — BP 121/82 | HR 57 | Temp 98.1°F | Resp 20 | Ht 72.0 in | Wt 227.0 lb

## 2020-08-10 DIAGNOSIS — I70212 Atherosclerosis of native arteries of extremities with intermittent claudication, left leg: Secondary | ICD-10-CM | POA: Diagnosis not present

## 2020-08-10 DIAGNOSIS — I739 Peripheral vascular disease, unspecified: Secondary | ICD-10-CM | POA: Diagnosis not present

## 2020-08-10 NOTE — Progress Notes (Signed)
[facility] [place]  Patient name: Richard Parrish MRN: [id number] DOB: 10-15-1950 Sex: male     REASON FOR CONSULT:    PAD  HISTORY OF PRESENT ILLNESS:   Richard Parrish is a 70 y.o. male, who has not been seen in our office for several years.  In 2013, I performed atherectomy and stent placement of his left superficial femoral and popliteal artery for left leg claudication.  He experienced immediate symptomatic relief.  He has not had close follow-up because of some issues that he has been going through which are now resolved.  He does not have any signs of claudication.  He does not have any open wounds or ulcers.  He is on dual antiplatelet therapy as well as statin therapy.  PAST MEDICAL HISTORY    Past Medical History:  Diagnosis Date   Anxiety    Arthritis    "lower back" (04/07/2017)   BPH (benign prostatic hypertrophy)    C. difficile diarrhea    CAD (coronary artery disease)    Chronic lower back pain    Coronary artery disease 1989   cabg with dvg to rca    Depression    GERD (gastroesophageal reflux disease)    Hyperlipidemia    Hypertension    Insomnia    Leg pain    Myocardial infarction ([place]) 2002   OSA on CPAP    cpap setting of 60 per pt   Osteoarthritis    "hips" (04/07/2017)   PAD (peripheral artery disease) ([place])    Peripheral vascular disease ([place])    Pneumonia 1989   S/P CABG   Pulmonary fibrosis ([place])    "one time" (04/07/2017)   Spinal stenosis      FAMILY HISTORY   Family History  Problem Relation Age of Onset   Heart disease Mother    Cancer Father     SOCIAL HISTORY:   Social History   Socioeconomic History   Marital status: Married    Spouse name: Not on file   Number of children: Not on file   Years of education: Not on file   Highest education level: Not on file  Occupational History   Occupation: Retired  Tobacco Use   Smoking status: Former    Packs/day: 2.00     Years: 15.00    Pack years: 30.00    Types: Cigarettes    Quit date: 03/01/1987    Years since quitting: 33.4   Smokeless tobacco: Never  Vaping Use   Vaping Use: Never used  Substance and Sexual Activity   Alcohol use: Yes    Comment: 04/07/2017 'couple drinks/month"   Drug use: No   Sexual activity: Not Currently  Other Topics Concern   Not on file  Social History Narrative   ** Merged History Encounter **       Social Determinants of Health   Financial Resource Strain: Not on file  Food Insecurity: Not on file  Transportation Needs: Not on file  Physical Activity: Not on file  Stress: Not on file  Social Connections: Not on file  Intimate Partner Violence: Not on file    ALLERGIES:    Allergies  Allergen Reactions   Ambien [Zolpidem Tartrate] Other (See Comments)    Bad dreams/Vivid Dreams   Zolpidem Other (See Comments)    Bad dreams/Vivid Dreams    CURRENT MEDICATIONS:    Current Outpatient Medications  Medication Sig Dispense Refill   acetaminophen (TYLENOL) 500 MG tablet  Take 500 mg by mouth every 6 (six) hours as needed.     aspirin EC 81 MG tablet Take 81 mg by mouth daily.     atorvastatin (LIPITOR) 80 MG tablet TAKE 1 TABLET BY MOUTH EVERY DAY 90 tablet 1   budesonide (ENTOCORT EC) 3 MG 24 hr capsule Take 9 mg by mouth daily.     clopidogrel (PLAVIX) 75 MG tablet TAKE 1 TABLET BY MOUTH EVERY DAY 90 tablet 2   Glucosamine HCl-MSM (GLUCOSAMINE-MSM PO) Take 1 tablet by mouth every other day.     HYDROcodone-acetaminophen (NORCO/VICODIN) 5-325 MG tablet Take 1 tablet by mouth 5 (five) times daily.      hyoscyamine (LEVSIN, ANASPAZ) 0.125 MG tablet Take 0.125 mg by mouth every 4 (four) hours as needed for cramping.      metaxalone (SKELAXIN) 800 MG tablet Take 800 mg by mouth 3 (three) times daily.     Methylsulfonylmethane 1000 MG TABS Take by mouth.     metoprolol succinate (TOPROL-XL) 50 MG 24 hr tablet TAKE 1 TABLET BY MOUTH EVERY DAY AFTER MEAL 90  tablet 1   Multiple Vitamin (MULTIVITAMIN) tablet Take 1 tablet by mouth every other day.      nitroGLYCERIN (NITROSTAT) 0.4 MG SL tablet Place 0.4 mg under the tongue every 5 (five) minutes as needed for chest pain.     Omega-3 Fatty Acids (FISH OIL) 1000 MG CAPS Take 1,000 mg by mouth every other day.      omeprazole (PRILOSEC OTC) 20 MG tablet Take 20 mg by mouth daily before breakfast.     PARoxetine (PAXIL) 40 MG tablet Take 40 mg by mouth daily.  6   Probiotic Product (PROBIOTIC PO) Take 1 capsule by mouth daily.     tamsulosin (FLOMAX) 0.4 MG CAPS capsule Take 1 capsule by mouth every morning.  5   traZODone (DESYREL) 50 MG tablet Take 50 mg by mouth at bedtime.     valsartan (DIOVAN) 320 MG tablet Take 1 tablet (320 mg total) by mouth daily. 90 tablet 3   No current facility-administered medications for this visit.    REVIEW OF SYSTEMS:   [X]  denotes positive finding, [ ]  denotes negative finding Cardiac  Comments:  Chest pain or chest pressure:    Shortness of breath upon exertion:    Short of breath when lying flat:    Irregular heart rhythm:        Vascular    Pain in calf, thigh, or hip brought on by ambulation:    Pain in feet at night that wakes you up from your sleep:     Blood clot in your veins:    Leg swelling:         Pulmonary    Oxygen at home:    Productive cough:     Wheezing:         Neurologic    Sudden weakness in arms or legs:     Sudden numbness in arms or legs:     Sudden onset of difficulty speaking or slurred speech:    Temporary loss of vision in one eye:     Problems with dizziness:         Gastrointestinal    Blood in stool:      Vomited blood:         Genitourinary    Burning when urinating:     Blood in urine:        Psychiatric    Major depression:  Hematologic    Bleeding problems:    Problems with blood clotting too easily:        Skin    Rashes or ulcers:        Constitutional    Fever or chills:      PHYSICAL EXAM:   Vitals:   08/10/20 1354  BP: 121/82  Pulse: (!) 57  Resp: 20  Temp: 98.1 F (36.7 C)  SpO2: 97%  Weight: 227 lb (103 kg)  Height: 6' (1.829 m)    GENERAL: The patient is a well-nourished male, in no acute distress. The vital signs are documented above. CARDIAC: There is a regular rate and rhythm.  VASCULAR: Palpable dorsalis pedis pulse bilaterally PULMONARY: Nonlabored respirations MUSCULOSKELETAL: There are no major deformities or cyanosis. NEUROLOGIC: No focal weakness or paresthesias are detected. SKIN: There are no ulcers or rashes noted. PSYCHIATRIC: The patient has a normal affect.  STUDIES:   I have reviewed the following studies:   +-------+-----------+-----------+------------+------------+  ABI/TBIToday's ABIToday's TBIPrevious ABIPrevious TBI  +-------+-----------+-----------+------------+------------+  Right  1.03       0.93       1.18        1.00          +-------+-----------+-----------+------------+------------+  Left   1.04       0.93       1.14        1.20          +-------+-----------+-----------+------------+------------+   Left: Patent stent with no evidence of stenosis in the Mid left SFA  artery. Calcified plaque seen in the common femoral artery without  significant stenosis.   ASSESSMENT and PLAN   PAD: The patient remains asymptomatic.  His ABIs are normal with a widely patent stent.  I have recommended follow-up in 1 year for surveillance imaging.   Leia Alf, MD, FACS [facility] [phone] Pager [phone]

## 2020-08-12 DIAGNOSIS — M545 Low back pain, unspecified: Secondary | ICD-10-CM | POA: Diagnosis not present

## 2020-08-12 DIAGNOSIS — M4726 Other spondylosis with radiculopathy, lumbar region: Secondary | ICD-10-CM | POA: Diagnosis not present

## 2020-08-12 DIAGNOSIS — M5136 Other intervertebral disc degeneration, lumbar region: Secondary | ICD-10-CM | POA: Diagnosis not present

## 2020-08-12 DIAGNOSIS — G894 Chronic pain syndrome: Secondary | ICD-10-CM | POA: Diagnosis not present

## 2020-08-13 DIAGNOSIS — R5383 Other fatigue: Secondary | ICD-10-CM | POA: Diagnosis not present

## 2020-08-13 DIAGNOSIS — F329 Major depressive disorder, single episode, unspecified: Secondary | ICD-10-CM | POA: Diagnosis not present

## 2020-08-13 DIAGNOSIS — R413 Other amnesia: Secondary | ICD-10-CM | POA: Diagnosis not present

## 2020-08-26 ENCOUNTER — Other Ambulatory Visit: Payer: Self-pay | Admitting: Vascular Surgery

## 2020-08-26 ENCOUNTER — Other Ambulatory Visit: Payer: Self-pay

## 2020-08-26 MED ORDER — CLOPIDOGREL BISULFATE 75 MG PO TABS: 75.0000 mg | ORAL_TABLET | Freq: Every day | ORAL | 11 refills | Status: DC

## 2020-09-10 DIAGNOSIS — M4726 Other spondylosis with radiculopathy, lumbar region: Secondary | ICD-10-CM | POA: Diagnosis not present

## 2020-09-10 DIAGNOSIS — G894 Chronic pain syndrome: Secondary | ICD-10-CM | POA: Diagnosis not present

## 2020-09-10 DIAGNOSIS — M5136 Other intervertebral disc degeneration, lumbar region: Secondary | ICD-10-CM | POA: Diagnosis not present

## 2020-09-10 DIAGNOSIS — M5106 Intervertebral disc disorders with myelopathy, lumbar region: Secondary | ICD-10-CM | POA: Diagnosis not present

## 2020-09-10 DIAGNOSIS — Z6829 Body mass index (BMI) 29.0-29.9, adult: Secondary | ICD-10-CM | POA: Diagnosis not present

## 2020-09-17 DIAGNOSIS — M5442 Lumbago with sciatica, left side: Secondary | ICD-10-CM | POA: Diagnosis not present

## 2020-09-17 DIAGNOSIS — F17290 Nicotine dependence, other tobacco product, uncomplicated: Secondary | ICD-10-CM | POA: Diagnosis not present

## 2020-09-17 DIAGNOSIS — M5441 Lumbago with sciatica, right side: Secondary | ICD-10-CM | POA: Diagnosis not present

## 2020-09-17 DIAGNOSIS — G8929 Other chronic pain: Secondary | ICD-10-CM | POA: Diagnosis not present

## 2020-10-02 DIAGNOSIS — M4726 Other spondylosis with radiculopathy, lumbar region: Secondary | ICD-10-CM | POA: Diagnosis not present

## 2020-10-02 DIAGNOSIS — M4326 Fusion of spine, lumbar region: Secondary | ICD-10-CM | POA: Diagnosis not present

## 2020-10-02 DIAGNOSIS — G894 Chronic pain syndrome: Secondary | ICD-10-CM | POA: Diagnosis not present

## 2020-10-02 DIAGNOSIS — M5136 Other intervertebral disc degeneration, lumbar region: Secondary | ICD-10-CM | POA: Diagnosis not present

## 2020-10-03 ENCOUNTER — Encounter (HOSPITAL_COMMUNITY): Payer: Self-pay | Admitting: Emergency Medicine

## 2020-10-03 ENCOUNTER — Telehealth: Payer: Self-pay | Admitting: Physician Assistant

## 2020-10-03 ENCOUNTER — Inpatient Hospital Stay (HOSPITAL_COMMUNITY)
Admission: EM | Admit: 2020-10-03 | Discharge: 2020-10-05 | DRG: 287 | Disposition: A | Discharge status: Home / Self Care | Payer: Medicare Other | Attending: Internal Medicine | Admitting: Internal Medicine

## 2020-10-03 ENCOUNTER — Emergency Department (HOSPITAL_COMMUNITY): Payer: Medicare Other

## 2020-10-03 ENCOUNTER — Other Ambulatory Visit: Payer: Self-pay

## 2020-10-03 DIAGNOSIS — N400 Enlarged prostate without lower urinary tract symptoms: Secondary | ICD-10-CM | POA: Diagnosis present

## 2020-10-03 DIAGNOSIS — R079 Chest pain, unspecified: Secondary | ICD-10-CM | POA: Insufficient documentation

## 2020-10-03 DIAGNOSIS — Z20822 Contact with and (suspected) exposure to covid-19: Secondary | ICD-10-CM | POA: Diagnosis present

## 2020-10-03 DIAGNOSIS — Z7982 Long term (current) use of aspirin: Secondary | ICD-10-CM | POA: Diagnosis not present

## 2020-10-03 DIAGNOSIS — G4733 Obstructive sleep apnea (adult) (pediatric): Secondary | ICD-10-CM | POA: Diagnosis present

## 2020-10-03 DIAGNOSIS — E440 Moderate protein-calorie malnutrition: Secondary | ICD-10-CM | POA: Diagnosis present

## 2020-10-03 DIAGNOSIS — Z888 Allergy status to other drugs, medicaments and biological substances status: Secondary | ICD-10-CM | POA: Diagnosis not present

## 2020-10-03 DIAGNOSIS — Z7902 Long term (current) use of antithrombotics/antiplatelets: Secondary | ICD-10-CM | POA: Diagnosis not present

## 2020-10-03 DIAGNOSIS — I25110 Atherosclerotic heart disease of native coronary artery with unstable angina pectoris: Secondary | ICD-10-CM | POA: Diagnosis not present

## 2020-10-03 DIAGNOSIS — I739 Peripheral vascular disease, unspecified: Secondary | ICD-10-CM | POA: Diagnosis present

## 2020-10-03 DIAGNOSIS — Y92009 Unspecified place in unspecified non-institutional (private) residence as the place of occurrence of the external cause: Secondary | ICD-10-CM

## 2020-10-03 DIAGNOSIS — E785 Hyperlipidemia, unspecified: Secondary | ICD-10-CM | POA: Diagnosis present

## 2020-10-03 DIAGNOSIS — W19XXXA Unspecified fall, initial encounter: Secondary | ICD-10-CM | POA: Diagnosis present

## 2020-10-03 DIAGNOSIS — I25710 Atherosclerosis of autologous vein coronary artery bypass graft(s) with unstable angina pectoris: Secondary | ICD-10-CM | POA: Diagnosis not present

## 2020-10-03 DIAGNOSIS — Z8249 Family history of ischemic heart disease and other diseases of the circulatory system: Secondary | ICD-10-CM | POA: Diagnosis not present

## 2020-10-03 DIAGNOSIS — F32A Depression, unspecified: Secondary | ICD-10-CM | POA: Diagnosis present

## 2020-10-03 DIAGNOSIS — Z95820 Peripheral vascular angioplasty status with implants and grafts: Secondary | ICD-10-CM | POA: Diagnosis not present

## 2020-10-03 DIAGNOSIS — I2510 Atherosclerotic heart disease of native coronary artery without angina pectoris: Secondary | ICD-10-CM | POA: Diagnosis present

## 2020-10-03 DIAGNOSIS — J8410 Pulmonary fibrosis, unspecified: Secondary | ICD-10-CM | POA: Diagnosis present

## 2020-10-03 DIAGNOSIS — R0602 Shortness of breath: Secondary | ICD-10-CM | POA: Diagnosis not present

## 2020-10-03 DIAGNOSIS — I10 Essential (primary) hypertension: Secondary | ICD-10-CM | POA: Diagnosis present

## 2020-10-03 DIAGNOSIS — I252 Old myocardial infarction: Secondary | ICD-10-CM

## 2020-10-03 DIAGNOSIS — I714 Abdominal aortic aneurysm, without rupture: Secondary | ICD-10-CM | POA: Diagnosis present

## 2020-10-03 DIAGNOSIS — Z96643 Presence of artificial hip joint, bilateral: Secondary | ICD-10-CM | POA: Diagnosis present

## 2020-10-03 DIAGNOSIS — Z951 Presence of aortocoronary bypass graft: Secondary | ICD-10-CM

## 2020-10-03 DIAGNOSIS — Z87891 Personal history of nicotine dependence: Secondary | ICD-10-CM

## 2020-10-03 DIAGNOSIS — Z955 Presence of coronary angioplasty implant and graft: Secondary | ICD-10-CM | POA: Diagnosis not present

## 2020-10-03 DIAGNOSIS — Z79899 Other long term (current) drug therapy: Secondary | ICD-10-CM | POA: Diagnosis not present

## 2020-10-03 DIAGNOSIS — F419 Anxiety disorder, unspecified: Secondary | ICD-10-CM | POA: Diagnosis present

## 2020-10-03 DIAGNOSIS — I200 Unstable angina: Secondary | ICD-10-CM

## 2020-10-03 DIAGNOSIS — K219 Gastro-esophageal reflux disease without esophagitis: Secondary | ICD-10-CM | POA: Diagnosis present

## 2020-10-03 DIAGNOSIS — R0789 Other chest pain: Secondary | ICD-10-CM | POA: Diagnosis not present

## 2020-10-03 LAB — URINALYSIS, ROUTINE W REFLEX MICROSCOPIC
Bilirubin Urine: NEGATIVE
Glucose, UA: NEGATIVE mg/dL
Hgb urine dipstick: NEGATIVE
Ketones, ur: NEGATIVE mg/dL
Leukocytes,Ua: NEGATIVE
Nitrite: NEGATIVE
Protein, ur: NEGATIVE mg/dL
Specific Gravity, Urine: 1.009 (ref 1.005–1.030)
pH: 7 (ref 5.0–8.0)

## 2020-10-03 LAB — CBC WITH DIFFERENTIAL/PLATELET
Abs Immature Granulocytes: 0.03 10*3/uL (ref 0.00–0.07)
Basophils Absolute: 0 10*3/uL (ref 0.0–0.1)
Basophils Relative: 1 %
Eosinophils Absolute: 0.1 10*3/uL (ref 0.0–0.5)
Eosinophils Relative: 2 %
HCT: 43.7 % (ref 39.0–52.0)
Hemoglobin: 15.2 g/dL (ref 13.0–17.0)
Immature Granulocytes: 1 %
Lymphocytes Relative: 16 %
Lymphs Abs: 0.9 10*3/uL (ref 0.7–4.0)
MCH: 33.3 pg (ref 26.0–34.0)
MCHC: 34.8 g/dL (ref 30.0–36.0)
MCV: 95.6 fL (ref 80.0–100.0)
Monocytes Absolute: 0.3 10*3/uL (ref 0.1–1.0)
Monocytes Relative: 5 %
Neutro Abs: 4.1 10*3/uL (ref 1.7–7.7)
Neutrophils Relative %: 75 %
Platelets: 318 10*3/uL (ref 150–400)
RBC: 4.57 MIL/uL (ref 4.22–5.81)
RDW: 12.8 % (ref 11.5–15.5)
WBC: 5.4 10*3/uL (ref 4.0–10.5)
nRBC: 0 % (ref 0.0–0.2)

## 2020-10-03 LAB — COMPREHENSIVE METABOLIC PANEL
ALT: 21 U/L (ref 0–44)
AST: 25 U/L (ref 15–41)
Albumin: 4 g/dL (ref 3.5–5.0)
Alkaline Phosphatase: 77 U/L (ref 38–126)
Anion gap: 9 (ref 5–15)
BUN: 14 mg/dL (ref 8–23)
CO2: 24 mmol/L (ref 22–32)
Calcium: 9.4 mg/dL (ref 8.9–10.3)
Chloride: 104 mmol/L (ref 98–111)
Creatinine, Ser: 1.07 mg/dL (ref 0.61–1.24)
GFR, Estimated: >60 mL/min (ref >60)
Glucose, Bld: 175 mg/dL — ABNORMAL HIGH (ref 70–99)
Potassium: 3.7 mmol/L (ref 3.5–5.1)
Sodium: 137 mmol/L (ref 135–145)
Total Bilirubin: 1.2 mg/dL (ref 0.3–1.2)
Total Protein: 6.6 g/dL (ref 6.5–8.1)

## 2020-10-03 LAB — HEMOGLOBIN A1C
Hgb A1c MFr Bld: 5.3 % (ref 4.8–5.6)
Mean Plasma Glucose: 105.41 mg/dL

## 2020-10-03 LAB — PROTIME-INR
INR: 1 (ref 0.8–1.2)
Prothrombin Time: 13.4 seconds (ref 11.4–15.2)

## 2020-10-03 LAB — RESP PANEL BY RT-PCR (FLU A&B, COVID) ARPGX2
Influenza A by PCR: NEGATIVE
Influenza B by PCR: NEGATIVE
SARS Coronavirus 2 by RT PCR: NEGATIVE

## 2020-10-03 LAB — BRAIN NATRIURETIC PEPTIDE: B Natriuretic Peptide: 59.2 pg/mL (ref 0.0–100.0)

## 2020-10-03 LAB — APTT: aPTT: 35 seconds (ref 24–36)

## 2020-10-03 LAB — TROPONIN I (HIGH SENSITIVITY)
Troponin I (High Sensitivity): 5 ng/L (ref <18)
Troponin I (High Sensitivity): 5 ng/L (ref <18)

## 2020-10-03 MED ORDER — HYOSCYAMINE SULFATE 0.125 MG PO TABS: 0.1250 mg | ORAL_TABLET | ORAL | Status: DC | PRN

## 2020-10-03 MED ORDER — METOPROLOL SUCCINATE ER 50 MG PO TB24
50.0000 mg | ORAL_TABLET | Freq: Every day | ORAL | Status: DC
  Administered 2020-10-04 – 2020-10-05 (×2): 50 mg via ORAL
  Filled 2020-10-03 (×2): qty 1

## 2020-10-03 MED ORDER — HYDROCODONE-ACETAMINOPHEN 5-325 MG PO TABS
1.0000 | ORAL_TABLET | Freq: Four times a day (QID) | ORAL | Status: DC | PRN
  Administered 2020-10-04 – 2020-10-05 (×6): 1 via ORAL
  Filled 2020-10-03 (×6): qty 1

## 2020-10-03 MED ORDER — NITROGLYCERIN 0.4 MG SL SUBL: 0.4000 mg | SUBLINGUAL_TABLET | SUBLINGUAL | Status: DC | PRN

## 2020-10-03 MED ORDER — BUDESONIDE 3 MG PO CPEP
6.0000 mg | ORAL_CAPSULE | Freq: Every morning | ORAL | Status: DC
  Administered 2020-10-04 – 2020-10-05 (×2): 6 mg via ORAL
  Filled 2020-10-03 (×4): qty 2

## 2020-10-03 MED ORDER — ASPIRIN EC 81 MG PO TBEC: 162.0000 mg | DELAYED_RELEASE_TABLET | Freq: Every day | ORAL | Status: DC | PRN

## 2020-10-03 MED ORDER — ASPIRIN 325 MG PO TABS
325.0000 mg | ORAL_TABLET | Freq: Every day | ORAL | Status: DC
  Administered 2020-10-03: 325 mg via ORAL
  Filled 2020-10-03: qty 1

## 2020-10-03 MED ORDER — IRBESARTAN 150 MG PO TABS
300.0000 mg | ORAL_TABLET | Freq: Every day | ORAL | Status: DC
  Administered 2020-10-04 – 2020-10-05 (×2): 300 mg via ORAL
  Filled 2020-10-03 (×2): qty 2

## 2020-10-03 MED ORDER — HEPARIN BOLUS VIA INFUSION
4000.0000 [IU] | Freq: Once | INTRAVENOUS | Status: AC
  Administered 2020-10-03: 4000 [IU] via INTRAVENOUS
  Filled 2020-10-03: qty 4000

## 2020-10-03 MED ORDER — HEPARIN (PORCINE) 25000 UT/250ML-% IV SOLN
1250.0000 [IU]/h | INTRAVENOUS | Status: DC
  Administered 2020-10-03 – 2020-10-04 (×2): 1250 [IU]/h via INTRAVENOUS
  Filled 2020-10-03 (×2): qty 250

## 2020-10-03 MED ORDER — TAMSULOSIN HCL 0.4 MG PO CAPS
0.4000 mg | ORAL_CAPSULE | Freq: Two times a day (BID) | ORAL | Status: DC
  Administered 2020-10-03 – 2020-10-05 (×4): 0.4 mg via ORAL
  Filled 2020-10-03 (×4): qty 1

## 2020-10-03 MED ORDER — BUDESONIDE 3 MG PO CPEP: 3.0000 mg | ORAL_CAPSULE | ORAL | Status: DC

## 2020-10-03 MED ORDER — METAXALONE 800 MG PO TABS: 800.0000 mg | ORAL_TABLET | Freq: Three times a day (TID) | ORAL | Status: DC | PRN

## 2020-10-03 MED ORDER — ONDANSETRON HCL 4 MG/2ML IJ SOLN: 4.0000 mg | Freq: Four times a day (QID) | INTRAMUSCULAR | Status: DC | PRN

## 2020-10-03 MED ORDER — VITAMIN D 25 MCG (1000 UNIT) PO TABS
1000.0000 [IU] | ORAL_TABLET | Freq: Every day | ORAL | Status: DC
  Administered 2020-10-04 – 2020-10-05 (×2): 1000 [IU] via ORAL
  Filled 2020-10-03 (×2): qty 1

## 2020-10-03 MED ORDER — ACETAMINOPHEN 500 MG PO TABS
500.0000 mg | ORAL_TABLET | Freq: Four times a day (QID) | ORAL | Status: DC | PRN
  Administered 2020-10-04 – 2020-10-05 (×3): 500 mg via ORAL
  Filled 2020-10-03 (×3): qty 1

## 2020-10-03 MED ORDER — ACETAMINOPHEN 325 MG PO TABS: 650.0000 mg | ORAL_TABLET | ORAL | Status: DC | PRN

## 2020-10-03 MED ORDER — ATORVASTATIN CALCIUM 80 MG PO TABS
80.0000 mg | ORAL_TABLET | Freq: Every day | ORAL | Status: DC
  Administered 2020-10-04 – 2020-10-05 (×2): 80 mg via ORAL
  Filled 2020-10-03 (×2): qty 1

## 2020-10-03 MED ORDER — PAROXETINE HCL 20 MG PO TABS
40.0000 mg | ORAL_TABLET | Freq: Every day | ORAL | Status: DC
  Administered 2020-10-04 – 2020-10-05 (×2): 40 mg via ORAL
  Filled 2020-10-03 (×2): qty 2

## 2020-10-03 MED ORDER — BUDESONIDE 3 MG PO CPEP
3.0000 mg | ORAL_CAPSULE | Freq: Every day | ORAL | Status: DC
  Administered 2020-10-03 – 2020-10-04 (×2): 3 mg via ORAL
  Filled 2020-10-03 (×3): qty 1

## 2020-10-03 MED ORDER — PANTOPRAZOLE SODIUM 40 MG PO TBEC
40.0000 mg | DELAYED_RELEASE_TABLET | Freq: Every day | ORAL | Status: DC
  Administered 2020-10-04 – 2020-10-05 (×2): 40 mg via ORAL
  Filled 2020-10-03 (×2): qty 1

## 2020-10-03 MED ORDER — MELATONIN 3 MG PO TABS
6.0000 mg | ORAL_TABLET | Freq: Every evening | ORAL | Status: DC | PRN
  Administered 2020-10-03 – 2020-10-04 (×2): 6 mg via ORAL
  Filled 2020-10-03 (×2): qty 2

## 2020-10-03 NOTE — H&P (Signed)
Cardiology Admission History and Physical:   Patient ID: Richard Parrish MRN: [id number]; DOB: 09/23/1950   Admission date: 10/03/2020  PCP:  Seward Carol, MD   [facility] Cardiologist:  Sinclair Grooms, MD        Chief Complaint:  chest pain  Patient Profile:   Richard Parrish is a 70 y.o. male with a hx of CAD, prior bypass graft to RCA for failed angioplasty , patent but diffusely diseased SVG to RCA document by catheterization 2012 , severe degenerative disc disease, depression, abdominal aortic aneurysm ,  PAD with prior left superficial femoral and popliteal atherectomy in-stent, ILD/Lung fibrosisand hypertension who is being seen 10/03/2020 for the evaluation of chest pain.  History of Present Illness:    Richard Parrish is a 71 y.o. male with a hx of CAD, prior bypass graft to RCA for failed angioplasty , patent but diffusely diseased SVG to RCA document by catheterization 2012 , severe degenerative disc disease, depression, abdominal aortic aneurysm ,  PAD with prior left superficial femoral and popliteal atherectomy in-stent, and hypertension who is being seen 10/03/2020 for the evaluation of chest pain and exertional SOB for a few weeks.  The patient reports he woke up with chest pain this am (2am), took 3-4 NTG with relief of chest pain. No other associated symptoms or orthopnea, pnd, LE edema, dizziness, syncope.  Has not had more episodes but since last few weeks he has been having exertional sob, fatigue while playing golf (avid golfer). One time he had to rest due to extreme symptoms and dizziness but no chest pain. Given his significant cardiac h/o and chest tightness this am, he came to the ER. Denies substance use. Fairly active- able to do ADLS but back pain is limiting to some extent.  ER eval: EKG non specific changes, trops x2 negative, cxr- chronic lung scarring, vitals stable, no chest pain.     Past Medical History:  Diagnosis Date   Anxiety     Arthritis    "lower back" (04/07/2017)   BPH (benign prostatic hypertrophy)    C. difficile diarrhea    CAD (coronary artery disease)    Chronic lower back pain    Coronary artery disease 1989   cabg with dvg to rca    Depression    GERD (gastroesophageal reflux disease)    Hyperlipidemia    Hypertension    Insomnia    Leg pain    Myocardial infarction ([place]) 2002   OSA on CPAP    cpap setting of 60 per pt   Osteoarthritis    "hips" (04/07/2017)   PAD (peripheral artery disease) ([place])    Peripheral vascular disease ([place])    Pneumonia 1989   S/P CABG   Pulmonary fibrosis ([place])    "one time" (04/07/2017)   Spinal stenosis     Past Surgical History:  Procedure Laterality Date   ABDOMINAL AORTAGRAM N/A 03/15/2011   Procedure: ABDOMINAL Maxcine Ham;  Surgeon: Serafina Mitchell, MD;  Location[facility] CATH LAB;  Service: Cardiovascular;  Laterality: N/A;   ABDOMINAL AORTAGRAM N/A 04/12/2011   Procedure: ABDOMINAL Maxcine Ham;  Surgeon: Serafina Mitchell, MD;  Location: [facility] CATH LAB;  Service: Cardiovascular;  Laterality: N/A;   BACK SURGERY     CARDIAC CATHETERIZATION  08/1987   CORONARY ANGIOPLASTY WITH STENT PLACEMENT  ~ 2002   [facility], [place]   CORONARY ARTERY BYPASS GRAFT  09/1987   "CABG X1";  [facility]; [place]; "RCA"  JOINT REPLACEMENT     KNEE ARTHROSCOPY Left    LAPAROSCOPIC CHOLECYSTECTOMY     PERIPHERAL VASCULAR INTERVENTION Left 2016   "put a stent; right branch of left femoral"   POSTERIOR LUMBAR FUSION  2009   lumbar fusion, S L 5 to L 4   SHOULDER ARTHROSCOPY W/ ROTATOR CUFF REPAIR Right 2006   SHOULDER ARTHROSCOPY WITH SUBACROMIAL DECOMPRESSION AND OPEN ROTATOR C Left ~ [place] HIP ARTHROPLASTY Right 2012   TOTAL HIP ARTHROPLASTY Left 04/07/2017   TOTAL HIP ARTHROPLASTY Left 04/07/2017   Procedure: LEFT TOTAL HIP ARTHROPLASTY;  Surgeon: Earlie Server, MD;  Location: [place] [facility];  Service: Orthopedics;  Laterality:  Left;     Medications Prior to Admission: Prior to Admission medications   Medication Sig Start Date End Date Taking? Authorizing Provider  acetaminophen (TYLENOL) 500 MG tablet Take 500 mg by mouth every 6 (six) hours as needed.    [provider]  aspirin EC 81 MG tablet Take 81 mg by mouth daily.    [provider]  atorvastatin (LIPITOR) 80 MG tablet TAKE 1 TABLET BY MOUTH EVERY DAY 07/10/20   Belva Crome, MD  budesonide (ENTOCORT EC) 3 MG 24 hr capsule Take 9 mg by mouth daily.    [provider]  clopidogrel (PLAVIX) 75 MG tablet TAKE 1 TABLET BY MOUTH EVERY DAY 08/27/20   Waynetta Sandy, MD  clopidogrel (PLAVIX) 75 MG tablet Take 1 tablet (75 mg total) by mouth daily. 08/26/20   Serafina Mitchell, MD  Glucosamine HCl-MSM (GLUCOSAMINE-MSM PO) Take 1 tablet by mouth every other day.    [provider]  HYDROcodone-acetaminophen (NORCO/VICODIN) 5-325 MG tablet Take 1 tablet by mouth 5 (five) times daily.     [provider]  hyoscyamine (LEVSIN, ANASPAZ) 0.125 MG tablet Take 0.125 mg by mouth every 4 (four) hours as needed for cramping.     [provider]  metaxalone (SKELAXIN) 800 MG tablet Take 800 mg by mouth 3 (three) times daily.    [provider]  Methylsulfonylmethane 1000 MG TABS Take by mouth.    [provider]  metoprolol succinate (TOPROL-XL) 50 MG 24 hr tablet TAKE 1 TABLET BY MOUTH EVERY DAY AFTER MEAL 03/16/20   Belva Crome, MD  Multiple Vitamin (MULTIVITAMIN) tablet Take 1 tablet by mouth every other day.     [provider]  nitroGLYCERIN (NITROSTAT) 0.4 MG SL tablet Place 0.4 mg under the tongue every 5 (five) minutes as needed for chest pain.    [provider]  Omega-3 Fatty Acids (FISH OIL) 1000 MG CAPS Take 1,000 mg by mouth every other day.     [provider]  omeprazole (PRILOSEC OTC) 20 MG tablet Take 20 mg by mouth daily before breakfast.    [provider]  PARoxetine (PAXIL) 40 MG tablet Take 40 mg by mouth daily. 01/05/15   [provider]  Probiotic Product (PROBIOTIC PO) Take 1 capsule by mouth daily.    [provider]  tamsulosin (FLOMAX) 0.4 MG CAPS capsule Take 1 capsule by mouth every morning. 08/18/14   [provider]  traZODone (DESYREL) 50 MG tablet Take 50 mg by mouth at bedtime. 11/22/18   [provider]  valsartan (DIOVAN) 320 MG tablet Take 1 tablet (320 mg total) by mouth daily. 04/17/20   Belva Crome, MD     Allergies:    Allergies  Allergen Reactions   Ambien [Zolpidem Tartrate] Other (See Comments)    Bad dreams/Vivid Dreams    Social History:   Social History   Socioeconomic History   Marital status: Married    Spouse name: Not on file   Number of children: Not on file   Years of education: Not on file   Highest education level: Not on file  Occupational History   Occupation: Retired  Tobacco Use   Smoking status: Former    Packs/day: 2.00    Years: 15.00    Pack years: 30.00    Types: Cigarettes    Quit date: 03/01/1987    Years since quitting: 33.6   Smokeless tobacco: Never  Vaping Use   Vaping Use: Never used  Substance and Sexual Activity   Alcohol use: Yes    Comment: 04/07/2017 'couple drinks/month"   Drug use: No   Sexual activity: Not Currently  Other Topics Concern   Not on file  Social History Narrative   ** Merged History Encounter **       Social Determinants of Health   Financial Resource Strain: Not on file  Food Insecurity: Not on file  Transportation Needs: Not on file  Physical Activity: Not on file  Stress: Not on file  Social Connections: Not on file  Intimate Partner Violence: Not on file    Family History:   The patient's family history includes Cancer in his father; Heart disease in his mother.    ROS:  Please see the history of present illness.  All other ROS reviewed and negative.     Physical Exam/Data:    Vitals:   10/03/20 1700 10/03/20 1715 10/03/20 1730 10/03/20 1745  BP: (!) 146/81 125/78 (!) 141/131 136/85  Pulse: (!) 52 (!) 55 (!) 53 (!) 51  Resp: 12 15 12 13   Temp:      SpO2: 100% 100% 100% 100%  Weight:      Height:       No intake or output data in the 24 hours ending 10/03/20 1850 Last 3 Weights 10/03/2020 08/10/2020 08/03/2020  Weight (lbs) 220 lb 227 lb 229 lb  Weight (kg) 99.791 kg 102.967 kg 103.874 kg     Body mass index is 29.84 kg/m.  General:  Well nourished, well developed, in no acute distress HEENT: normal Lymph: no adenopathy Neck: no JVD Endocrine:  No thryomegaly Vascular: No carotid bruits; FA pulses 2+ bilaterally without bruits  Cardiac:  normal S1, S2; RRR; no murmur  Lungs:  clear to auscultation bilaterally, no wheezing, rhonchi or rales  Abd: soft, nontender, no hepatomegaly  Ext: no edema Musculoskeletal:  No deformities, BUE and BLE strength normal and equal Skin: warm and dry  Neuro:  CNs 2-12 intact, no focal abnormalities noted Psych:  Normal affect      Laboratory Data:  High Sensitivity Troponin:   Recent Labs  Lab 10/03/20 1327 10/03/20 1524  TROPONINIHS 5 5      Chemistry Recent Labs  Lab 10/03/20 1327  NA 137  K 3.7  CL 104  CO2 24  GLUCOSE 175*  BUN 14  CREATININE 1.07  CALCIUM 9.4  GFRNONAA >60  ANIONGAP 9    Recent Labs  Lab 10/03/20 1327  PROT 6.6  ALBUMIN 4.0  AST 25  ALT 21  ALKPHOS 77  BILITOT 1.2   Hematology Recent Labs  Lab 10/03/20 1327  WBC 5.4  RBC 4.57  HGB 15.2  HCT 43.7  MCV 95.6  MCH  33.3  MCHC 34.8  RDW 12.8  PLT 318   BNP Recent Labs  Lab 10/03/20 1327  BNP 59.2    DDimer No results for input(s): DDIMER in the last 168 hours.   Radiology/Studies:  DG Chest 2 View  Result Date: 10/03/2020 CLINICAL DATA:  Chest pain.  Shortness of breath. EXAM: CHEST - 2 VIEW COMPARISON:  04/30/2020 FINDINGS: Mildly displaced left seventh rib fracture is probably old but new since  04/30/2020. Scattered linear and interstitial densities in the lower lungs appear to be chronic. Heart size is normal and stable. Prior median sternotomy. No large pleural effusions. Degenerative endplate changes in the thoracic spine. IMPRESSION: Chronic changes in the lower lungs. Findings are compatible with areas of scarring and fibrosis. No acute findings. Age indeterminate left seventh rib fracture. Electronically Signed   By: Markus Daft M.D.   On: 10/03/2020 13:48    EKG:  NSR, non specific ST-T wave changes, minor ST depression in anterolateral leads  Trops negative  Relevant CV Studies: LHC: 2012 RESULTS: 1. Hemodynamic data:     a.     Aortic pressure 101/50 mmHg.     b.     Left ventricular pressure 102/21. 2. Left ventriculography:  Left ventricle demonstrates normal overall     contractility with EF of 55% and suggestion of mild inferior     hypokinesis.  No mitral regurgitation. 3. Coronary angiography.     a.     The left main coronary:  The left main coronary artery      contains no significant obstruction.     b.     Left anterior descending coronary:  Left anterior descending      is anatomically relatively small in distribution, just reaching      the left ventricular apex.  Gives origin to a large branching      first diagonal.  It also gives origin to a large septal      perforator.  No significant obstruction is noted in the LAD or its      branches.     c.     Circumflex artery:  Circumflex coronary artery is relatively      small in distribution.  It gives origin to a moderate-sized first      obtuse marginal and a smaller second obtuse marginal.  No      significant obstruction is noted.     d.     Right coronary:  The right coronary artery is totally      occluded in the aortoostial junction.  A conus branch arises from      this site.  The distal right coronary is supplied by a patent      saphenous vein graft. 4. Bypass graft angiography.     a.      Saphenous vein graft to the distal right coronary:  The      graft is large in diameter, patulous, and supplies an anastomosis      to the distal right coronary.  Beyond this anastomosis, a large      PDA and left ventricular branch arises.  The bypass graft itself      reveals a widely patent stent in the mid-to-distal body proximal      to which there is bulky eccentric disease throughout a region of      irregularity.  This entire region is obstructed in the 50-70%      range depending upon view.  No  significant obstruction is noted in      the PDA or large left ventricular branch.  The PDA supplies the      apex.   ASSESSMENT: 1. Widely patent left coronary system with only mild-to-moderate     narrowing in the first diagonal.  The left coronary system is     relatively small. 2. Dominant native right coronary system with total occlusion of     proximal vessel. 3. Patent but diffusely diseased saphenous vein graft to the distal     right coronary with 50-75% intermedius stenosis in the mid and     proximal segment proximal to previously placed stent.   RECOMMENDATIONS:  The patient's right coronary saphenous vein graft is old and degenerated.  The proximal segment likely need stenting although there is not a high-grade obstruction noted.  My clinical impression is that the patient should probably undergo his hip surgery firs and at some later date once he is recovered, we can come back and attempt to place a drug-eluting stent in this proximal-to-mid diffusely diseased segment using distal protection.  My consideration for not going forward with drug-eluting stenting is the high risk of complications with this graft and the need for prolonged dual-antiplatelet therapy.  He appears to have widely patent enough graft to be able to tolerate surgery and rehab without excessive risk.    Assessment and Plan:   Chest pain/Unstable angina CAD s/p CABG (SVG-RCA-diffuse dx by [facility]  2012) PAD s/p left superficial femoral, popliteal atherectomy in stent HTN, HLD Abdominal aortic aneurysm DJD, Depression. H/o lung fibrosis/ILD  Plan:  - admit to Cardiology service. Given significant CAD s/p CABG with both native and graft disease and woke up with chest pain.unstable angina- will start heparin gtt - s/p aspirin full dose, continue 81mg . HOLD plavix - if he has 3v dx  - continue atorva 80mg , toprol xl, valsartan equivalent - ECHO in am - get lipidpanel, a1c etc  - NPO on Sunday night for LHC/CA in am (access probably right groin, has left PAD). - continue home meds - full code.   Risk Assessment/Risk Scores:    TIMI Risk Score for Unstable Angina or Non-ST Elevation MI:   The patient's TIMI risk score is 5, which indicates a 26% risk of all cause mortality, new or recurrent myocardial infarction or need for urgent revascularization in the next 14 days.       Severity of Illness: The appropriate patient status for this patient is INPATIENT. Inpatient status is judged to be reasonable and necessary in order to provide the required intensity of service to ensure the patient's safety. The patient's presenting symptoms, physical exam findings, and initial radiographic and laboratory data in the context of their chronic comorbidities is felt to place them at high risk for further clinical deterioration. Furthermore, it is not anticipated that the patient will be medically stable for discharge from the hospital within 2 midnights of admission. The following factors support the patient status of inpatient.   " The patient's presenting symptoms include chest pain/unstable angina. " The worrisome physical exam findings include chest pain. " The initial radiographic and laboratory data are worrisome because of EKG with non specific ST changes. " The chronic co-morbidities include CAD, s/p CABG, PAD, HTN, HLD.   * I certify that at the point of admission it is my clinical  judgment that the patient will require inpatient hospital care spanning beyond 2 midnights from the point of admission due to high intensity  of service, high risk for further deterioration and high frequency of surveillance required.*   For questions or updates, please contact [place] Please consult [email or web address] for contact info under     Signed, Renae Fickle, MD  10/03/2020 6:50 PM

## 2020-10-03 NOTE — ED Provider Notes (Signed)
[facility] EMERGENCY DEPARTMENT Provider Note   CSN: [id number] Arrival date & time: 10/03/20  1300     History Chief Complaint  Patient presents with   Chest Pain    KENITH TRICKEL is a 70 y.o. male.  The history is provided by the patient.  Chest Pain Pain location:  Substernal area Pain quality: tightness   Pain quality comment:  Squeezing Pain radiates to:  Does not radiate Pain severity:  No pain Onset quality:  Sudden Progression:  Resolved Chronicity:  New Context: breathing and at rest   Relieved by:  Nitroglycerin Associated symptoms: shortness of breath   Associated symptoms: no abdominal pain, no altered mental status, no back pain, no cough, no diaphoresis, no fever, no headache, no lower extremity edema, no nausea, no numbness, no syncope, no vomiting and no weakness   Risk factors: coronary artery disease and male sex       Past Medical History:  Diagnosis Date   Anxiety    Arthritis    "lower back" (04/07/2017)   BPH (benign prostatic hypertrophy)    C. difficile diarrhea    CAD (coronary artery disease)    Chronic lower back pain    Coronary artery disease 1989   cabg with dvg to rca    Depression    GERD (gastroesophageal reflux disease)    Hyperlipidemia    Hypertension    Insomnia    Leg pain    Myocardial infarction ([place]) 2002   OSA on CPAP    cpap setting of 60 per pt   Osteoarthritis    "hips" (04/07/2017)   PAD (peripheral artery disease) (HCC)    Peripheral vascular disease ([place])    Pneumonia 1989   S/P CABG   Pulmonary fibrosis ([place])    "one time" (04/07/2017)   Spinal stenosis     Patient Active Problem List   Diagnosis Date Noted   Disc degeneration, lumbar 02/10/2018   Aortic atherosclerosis ([place]) 11/13/2017   Pure hypercholesterolemia 11/13/2017   Degenerative joint disease of left hip 04/07/2017   Dog bite of face 08/08/2016   Right hip pain 06/01/2016   Achilles tendon pain 12/31/2015    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   Cough 07/24/2013   CAD (coronary artery disease), native coronary artery 04/01/2013   Essential hypertension, benign 12/12/2012    Class: Chronic   Other and unspecified hyperlipidemia 12/12/2012   Atherosclerosis of native arteries of the extremities with intermittent claudication 11/25/2011   Peripheral vascular disease, unspecified ([place]) 02/28/2011    Past Surgical History:  Procedure Laterality Date   ABDOMINAL AORTAGRAM N/A 03/15/2011   Procedure: ABDOMINAL Maxcine Ham;  Surgeon: Serafina Mitchell, MD;  Location[facility] CATH LAB;  Service: Cardiovascular;  Laterality: N/A;   ABDOMINAL AORTAGRAM N/A 04/12/2011   Procedure: ABDOMINAL Maxcine Ham;  Surgeon: Serafina Mitchell, MD;  Location: [facility] CATH LAB;  Service: Cardiovascular;  Laterality: N/A;   BACK SURGERY     CARDIAC CATHETERIZATION  08/1987   CORONARY ANGIOPLASTY WITH STENT PLACEMENT  ~ 2002   [facility], [place]   CORONARY ARTERY BYPASS GRAFT  09/1987   "CABG X1";  [facility]; [place]; "RCA"   JOINT REPLACEMENT     KNEE ARTHROSCOPY Left    LAPAROSCOPIC CHOLECYSTECTOMY     PERIPHERAL VASCULAR INTERVENTION Left 2016   "put a stent; right branch of left femoral"   POSTERIOR LUMBAR FUSION  2009   lumbar fusion, S L 5 to L 4  SHOULDER ARTHROSCOPY W/ ROTATOR CUFF REPAIR Right 2006   SHOULDER ARTHROSCOPY WITH SUBACROMIAL DECOMPRESSION AND OPEN ROTATOR C Left ~ Emily     TOTAL HIP ARTHROPLASTY Right 2012   TOTAL HIP ARTHROPLASTY Left 04/07/2017   TOTAL HIP ARTHROPLASTY Left 04/07/2017   Procedure: LEFT TOTAL HIP ARTHROPLASTY;  Surgeon: Earlie Server, MD;  Location: [place];  Service: Orthopedics;  Laterality: Left;       Family History  Problem Relation Age of Onset   Heart disease Mother    Cancer Father     Social History   Tobacco Use   Smoking status: Former    Packs/day: 2.00    Years: 15.00    Pack years: 30.00     Types: Cigarettes    Quit date: 03/01/1987    Years since quitting: 33.6   Smokeless tobacco: Never  Vaping Use   Vaping Use: Never used  Substance Use Topics   Alcohol use: Yes    Comment: 04/07/2017 'couple drinks/month"   Drug use: No    Home Medications Prior to Admission medications   Medication Sig Start Date End Date Taking? Authorizing Provider  acetaminophen (TYLENOL) 500 MG tablet Take 500 mg by mouth every 6 (six) hours as needed.    [provider]  aspirin EC 81 MG tablet Take 81 mg by mouth daily.    [provider]  atorvastatin (LIPITOR) 80 MG tablet TAKE 1 TABLET BY MOUTH EVERY DAY 07/10/20   Belva Crome, MD  budesonide (ENTOCORT EC) 3 MG 24 hr capsule Take 9 mg by mouth daily.    [provider]  clopidogrel (PLAVIX) 75 MG tablet TAKE 1 TABLET BY MOUTH EVERY DAY 08/27/20   Waynetta Sandy, MD  clopidogrel (PLAVIX) 75 MG tablet Take 1 tablet (75 mg total) by mouth daily. 08/26/20   Serafina Mitchell, MD  Glucosamine HCl-MSM (GLUCOSAMINE-MSM PO) Take 1 tablet by mouth every other day.    [provider]  HYDROcodone-acetaminophen (NORCO/VICODIN) 5-325 MG tablet Take 1 tablet by mouth 5 (five) times daily.     [provider]  hyoscyamine (LEVSIN, ANASPAZ) 0.125 MG tablet Take 0.125 mg by mouth every 4 (four) hours as needed for cramping.     [provider]  metaxalone (SKELAXIN) 800 MG tablet Take 800 mg by mouth 3 (three) times daily.    [provider]  Methylsulfonylmethane 1000 MG TABS Take by mouth.    [provider]  metoprolol succinate (TOPROL-XL) 50 MG 24 hr tablet TAKE 1 TABLET BY MOUTH EVERY DAY AFTER MEAL 03/16/20   Belva Crome, MD  Multiple Vitamin (MULTIVITAMIN) tablet Take 1 tablet by mouth every other day.     [provider]  nitroGLYCERIN (NITROSTAT) 0.4 MG SL tablet Place 0.4 mg under the tongue every 5 (five) minutes as needed for chest pain.    [provider]  Omega-3 Fatty Acids (FISH OIL) 1000 MG CAPS Take 1,000 mg by mouth every other day.     [provider]  omeprazole (PRILOSEC OTC) 20 MG tablet Take 20 mg by mouth daily before breakfast.    [provider]  PARoxetine (PAXIL) 40 MG tablet Take 40 mg by mouth daily. 01/05/15   [provider]  Probiotic Product (PROBIOTIC PO) Take 1 capsule by mouth daily.    [provider]  tamsulosin (FLOMAX) 0.4 MG CAPS capsule Take 1 capsule by mouth every morning. 08/18/14   [provider]  traZODone (DESYREL) 50 MG tablet Take 50 mg by mouth at bedtime. 11/22/18   [provider]  valsartan (DIOVAN) 320 MG tablet Take 1 tablet (320 mg total) by mouth daily. 04/17/20   Belva Crome, MD    Allergies    Ambien [zolpidem tartrate] and Zolpidem  Review of Systems   Review of Systems  Constitutional:  Negative for diaphoresis and fever.  Respiratory:  Positive for shortness of breath. Negative for cough.   Cardiovascular:  Positive for chest pain. Negative for syncope.  Gastrointestinal:  Negative for abdominal pain, nausea and vomiting.  Musculoskeletal:  Negative for back pain.  Neurological:  Negative for weakness, numbness and headaches.  All other systems reviewed and are negative.  Physical Exam Updated Vital Signs BP 136/85   Pulse (!) 51   Temp 98.8 F (37.1 C)   Resp 13   Ht 6' (1.829 m)   Wt 99.8 kg   SpO2 100%   BMI 29.84 kg/m   Physical Exam Vitals and nursing note reviewed.  Constitutional:      Appearance: He is well-developed.  HENT:     Head: Normocephalic and atraumatic.  Eyes:     Conjunctiva/sclera: Conjunctivae normal.  Cardiovascular:     Rate and Rhythm: Normal rate and regular rhythm.     Heart sounds: No murmur heard. Pulmonary:     Effort: Pulmonary effort is normal. No respiratory distress.     Breath sounds: Normal breath sounds.  Abdominal:     Palpations: Abdomen is soft.      Tenderness: There is no abdominal tenderness.  Musculoskeletal:     Cervical back: Neck supple.  Skin:    General: Skin is warm and dry.     Capillary Refill: Capillary refill takes less than 2 seconds.  Neurological:     General: No focal deficit present.     Mental Status: He is alert. He is disoriented.    ED Results / Procedures / Treatments   Labs (all labs ordered are listed, but only abnormal results are displayed) Labs Reviewed  COMPREHENSIVE METABOLIC PANEL - Abnormal; Notable for the following components:      Result Value   Glucose, Bld 175 (*)    All other components within normal limits  RESP PANEL BY RT-PCR (FLU A&B, COVID) ARPGX2  CBC WITH DIFFERENTIAL/PLATELET  BRAIN NATRIURETIC PEPTIDE  URINALYSIS, ROUTINE W REFLEX MICROSCOPIC  TROPONIN I (HIGH SENSITIVITY)  TROPONIN I (HIGH SENSITIVITY)    EKG EKG Interpretation  Date/Time:  Saturday October 03 2020 13:17:06 EDT Ventricular Rate:  81 PR Interval:  184 QRS Duration: 88 QT Interval:  364 QTC Calculation: 422 R Axis:   3 Text Interpretation: Normal sinus rhythm ST & T wave abnormality, consider inferior ischemia Abnormal ECG Confirmed by Regan Lemming ([id number]) on 10/03/2020 6:37:08 PM  Radiology DG Chest 2 View  Result Date: 10/03/2020 CLINICAL DATA:  Chest pain.  Shortness of breath. EXAM: CHEST - 2 VIEW COMPARISON:  04/30/2020 FINDINGS: Mildly displaced left seventh rib fracture is probably old but new since 04/30/2020. Scattered linear and interstitial densities in the lower lungs appear to be chronic. Heart size is normal and stable. Prior median sternotomy. No large pleural effusions. Degenerative endplate changes in the thoracic spine. IMPRESSION: Chronic changes in the lower lungs. Findings are compatible with areas of scarring and fibrosis. No acute findings. Age indeterminate left seventh rib fracture. Electronically Signed   By: Markus Daft M.D.   On: 10/03/2020 13:48  Procedures Procedures    Medications Ordered in ED Medications  aspirin tablet 325 mg (has no administration in time range)    ED Course  I have reviewed the triage vital signs and the nursing notes.  Pertinent labs & imaging results that were available during my care of the patient were reviewed by me and considered in my medical decision making (see chart for details).    MDM Rules/Calculators/A&P                           ALEPH NICKSON is a 70 y.o. male who presented to the Emergency Department c/o chest pain. Past medical records have been reviewed and are notable for coronary artery disease, prior bypass graft to the RCA for failed angioplasty, patent but diffusely diseased SVG to RCA documented by cardiac catheterization last in 2012, abdominal aortic aneurysm, severe degenerative disc disease, depression, PAD, hypertension.   Pertinent exam findings include: Equal pulses 2+ bilaterally, no apparent distress, lungs clear to auscultation bilaterally, no chest wall tenderness, no abdominal tenderness, mass or bruit.  EKG: Normal sinus rhythm with a rate of 81 no abnormal intervals, or dysrhythmia. Nonspecific ST changes present.  No concerning change from prior   Lab results include: COVID-19 and influenza PCR pending, troponins x2 negative, BNP negative, CBC without a leukocytosis, anemia or platelet abnormality, CMP without electrolyte abnormality, normal renal and liver function.  Imaging results include: CXR without acute cardiac or pulmonary abnormality  Course of tx has consisted of: Patient self administered 3-4 nitroglycerin with subsequent resolution of his pain.  Full-strength aspirin administered.  Thought process: The patient presents with typical substernal, squeezing cardiac chest pain that occurred at rest and awoke him from sleep.  His last cardiac catheterization was back in 2012 and did show disease.  He does have a history of prior bypass graft. Differential diagnosis includes:  ACS, pneumonia, pneumothorax, pulmonary embolism,pericarditis/myocarditis, GERD, PUD, musculoskeletal. Patient given ASA 325 mg, not given nitroglycerin due to resolution of symptoms  Labs unremarkable.  Unlikely pneumonia, no cough, no leukocytosis, no fevers, CXR and exam without acute findings. Unlikely pneumothorax, no findings on  CXR.  Low suspicion for PE given symptoms.  Unlikely pericarditis/myocarditis, does not fit clinical picture.  Given long duration of time from last ischemic evaluation, substernal chest pressure at rest that improved with nitroglycerin, concerning for potential unstable angina.  Cardiology consulted for admission.  Plan for admission and potential cardiac catheterization on Monday.   Final Clinical Impression(s) / ED Diagnoses Final diagnoses:  Chest pain, unspecified type  Unstable angina [facility])    Rx / DC Orders ED Discharge Orders     None        Regan Lemming, MD 10/03/20 1840

## 2020-10-03 NOTE — ED Provider Notes (Signed)
Emergency Medicine Provider Triage Evaluation Note  Richard Parrish , a 70 y.o. male  was evaluated in triage.  Pt complains of CP this morning, h/o of MI. Took nitro powder this AM w/ improvement. No pain right now, but does complain of shortness of breath w/ exertion over the last few weeks. No swelling. No nausea, vomiting.   Review of Systems  Positive: As above  Negative: As above  Physical Exam  BP 115/80 (BP Location: Left Arm)   Pulse 77   Temp 98.8 F (37.1 C)   Resp 18   SpO2 100%  Gen:   Awake, no distress   Resp:  Normal effort  MSK:   Moves extremities without difficulty  Other:    Medical Decision Making  Medically screening exam initiated at 1:18 PM.  Appropriate orders placed.  Richard Parrish was informed that the remainder of the evaluation will be completed by another provider, this initial triage assessment does not replace that evaluation, and the importance of remaining in the ED until their evaluation is complete.     Richard Balding, PA-C 10/03/20 1321    Regan Lemming, MD 10/03/20 2148

## 2020-10-03 NOTE — ED Triage Notes (Signed)
Reports SOB with exertion for a few weeks.  Reports pain to center of chest that started at 2am.  Took approx 3-4 NTG that had powdered in the bottom of the bottle with complete relief.  Denies nausea and vomiting.

## 2020-10-03 NOTE — Telephone Encounter (Signed)
   Pt called because he has been having exertional CP and SOB.  He has a pressure in his chest, thought it might be indigestion, but GI meds no help.  His wife is driving him in, they are on the way to the ER.  Advised him that is the right decision.  Rosaria Ferries, PA-C 10/03/2020 12:52 PM

## 2020-10-03 NOTE — Progress Notes (Signed)
ANTICOAGULATION CONSULT NOTE - Initial Consult  Pharmacy Consult for heparin Indication: chest pain/ACS  Allergies  Allergen Reactions   Ambien [Zolpidem Tartrate] Other (See Comments)    Bad dreams/Vivid Dreams    Patient Measurements: Height: 6' (182.9 cm) Weight: 99.8 kg (220 lb) IBW/kg (Calculated) : 77.6 Heparin Dosing Weight: 97.8 kg  Vital Signs: Temp: 98.8 F (37.1 C) (08/06 1314) BP: 136/85 (08/06 1745) Pulse Rate: 51 (08/06 1745)  Labs: Recent Labs    10/03/20 1327 10/03/20 1524  HGB 15.2  --   HCT 43.7  --   PLT 318  --   CREATININE 1.07  --   TROPONINIHS 5 5    Estimated Creatinine Clearance: 77.5 mL/min (by C-G formula based on SCr of 1.07 mg/dL).   Medical History: Past Medical History:  Diagnosis Date   Anxiety    Arthritis    "lower back" (04/07/2017)   BPH (benign prostatic hypertrophy)    C. difficile diarrhea    CAD (coronary artery disease)    Chronic lower back pain    Coronary artery disease 1989   cabg with dvg to rca    Depression    GERD (gastroesophageal reflux disease)    Hyperlipidemia    Hypertension    Insomnia    Leg pain    Myocardial infarction ([place]) 2002   OSA on CPAP    cpap setting of 60 per pt   Osteoarthritis    "hips" (04/07/2017)   PAD (peripheral artery disease) ([place])    Peripheral vascular disease ([place])    Pneumonia 1989   S/P CABG   Pulmonary fibrosis ([place])    "one time" (04/07/2017)   Spinal stenosis    Assessment: 70 yo M with CP - PMHx of CAD, CABG in 2012, AAA, depression, PAD, HTN. Chest pain is typical substernal, that occurred at rest. Cardiology consulted, plan to do cardiac cath on Monday. Heparin consult in per pharmacy for ACS/STEMI. H/H and PLT count wnl, no s/sx of bleeding.  Goal of Therapy:  Heparin level 0.3-0.7 units/ml Monitor platelets by anticoagulation protocol: Yes   Plan:  Give 4000 units bolus x 1 Start heparin infusion at 1250 units/hr Check anti-Xa level in 6 hours and daily  while on heparin Continue to monitor H&H and platelets  Joetta Manners, PharmD, [facility] Emergency Medicine Clinical Pharmacist ED RPh Phone: [place]: [phone]

## 2020-10-03 NOTE — ED Notes (Signed)
IP RN to call back for report

## 2020-10-04 ENCOUNTER — Inpatient Hospital Stay (HOSPITAL_COMMUNITY): Payer: Medicare Other

## 2020-10-04 DIAGNOSIS — I200 Unstable angina: Secondary | ICD-10-CM

## 2020-10-04 DIAGNOSIS — R079 Chest pain, unspecified: Secondary | ICD-10-CM

## 2020-10-04 LAB — LIPID PANEL
Cholesterol: 97 mg/dL (ref 0–200)
HDL: 35 mg/dL — ABNORMAL LOW (ref >40)
LDL Cholesterol: 53 mg/dL (ref 0–99)
Total CHOL/HDL Ratio: 2.8 RATIO
Triglycerides: 46 mg/dL (ref <150)
VLDL: 9 mg/dL (ref 0–40)

## 2020-10-04 LAB — CBC
HCT: 39.3 % (ref 39.0–52.0)
Hemoglobin: 13.9 g/dL (ref 13.0–17.0)
MCH: 33.8 pg (ref 26.0–34.0)
MCHC: 35.4 g/dL (ref 30.0–36.0)
MCV: 95.6 fL (ref 80.0–100.0)
Platelets: 260 10*3/uL (ref 150–400)
RBC: 4.11 MIL/uL — ABNORMAL LOW (ref 4.22–5.81)
RDW: 13 % (ref 11.5–15.5)
WBC: 7 10*3/uL (ref 4.0–10.5)
nRBC: 0 % (ref 0.0–0.2)

## 2020-10-04 LAB — BASIC METABOLIC PANEL
Anion gap: 9 (ref 5–15)
BUN: 17 mg/dL (ref 8–23)
CO2: 23 mmol/L (ref 22–32)
Calcium: 9.1 mg/dL (ref 8.9–10.3)
Chloride: 103 mmol/L (ref 98–111)
Creatinine, Ser: 1.03 mg/dL (ref 0.61–1.24)
GFR, Estimated: >60 mL/min (ref >60)
Glucose, Bld: 155 mg/dL — ABNORMAL HIGH (ref 70–99)
Potassium: 4 mmol/L (ref 3.5–5.1)
Sodium: 135 mmol/L (ref 135–145)

## 2020-10-04 LAB — ECHOCARDIOGRAM COMPLETE
AR max vel: 3.37 cm2
AV Area VTI: 3.5 cm2
AV Area mean vel: 3.29 cm2
AV Mean grad: 3 mmHg
AV Peak grad: 4.8 mmHg
Ao pk vel: 1.1 m/s
Area-P 1/2: 2.93 cm2
Height: 72 in
MV VTI: 2.8 cm2
S' Lateral: 2.9 cm
Weight: 3425.42 oz

## 2020-10-04 LAB — HEPARIN LEVEL (UNFRACTIONATED)
Heparin Unfractionated: 0.35 IU/mL (ref 0.30–0.70)
Heparin Unfractionated: 0.54 IU/mL (ref 0.30–0.70)

## 2020-10-04 MED ORDER — ENSURE ENLIVE PO LIQD: 237.0000 mL | Freq: Two times a day (BID) | ORAL | Status: DC

## 2020-10-04 MED ORDER — SODIUM CHLORIDE 0.9 % IV SOLN: 250.0000 mL | INTRAVENOUS | Status: DC | PRN

## 2020-10-04 MED ORDER — ASPIRIN EC 81 MG PO TBEC
81.0000 mg | DELAYED_RELEASE_TABLET | Freq: Every day | ORAL | Status: DC
  Administered 2020-10-04 – 2020-10-05 (×2): 81 mg via ORAL
  Filled 2020-10-04 (×2): qty 1

## 2020-10-04 MED ORDER — SODIUM CHLORIDE 0.9% FLUSH: 3.0000 mL | INTRAVENOUS | Status: DC | PRN

## 2020-10-04 MED ORDER — SODIUM CHLORIDE 0.9 % WEIGHT BASED INFUSION: 1.0000 mL/kg/h | INTRAVENOUS | Status: DC

## 2020-10-04 MED ORDER — SODIUM CHLORIDE 0.9% FLUSH
3.0000 mL | Freq: Two times a day (BID) | INTRAVENOUS | Status: DC
  Administered 2020-10-04: 3 mL via INTRAVENOUS

## 2020-10-04 MED ORDER — ALPRAZOLAM 0.25 MG PO TABS
0.2500 mg | ORAL_TABLET | Freq: Two times a day (BID) | ORAL | Status: DC | PRN
  Administered 2020-10-04 – 2020-10-05 (×2): 0.25 mg via ORAL
  Filled 2020-10-04 (×2): qty 1

## 2020-10-04 MED ORDER — ASPIRIN 81 MG PO CHEW
81.0000 mg | CHEWABLE_TABLET | ORAL | Status: AC
  Administered 2020-10-05: 81 mg via ORAL
  Filled 2020-10-04: qty 1

## 2020-10-04 MED ORDER — SODIUM CHLORIDE 0.9 % WEIGHT BASED INFUSION
3.0000 mL/kg/h | INTRAVENOUS | Status: DC
Start: 2020-10-05 — End: 2020-10-05
  Administered 2020-10-05: 3 mL/kg/h via INTRAVENOUS

## 2020-10-04 NOTE — Progress Notes (Signed)
[place] for heparin Indication: chest pain/ACS  Labs: Recent Labs    10/03/20 1327 10/03/20 1524 10/03/20 2039 10/04/20 0235 10/04/20 0942  HGB 15.2  --   --  13.9  --   HCT 43.7  --   --  39.3  --   PLT 318  --   --  260  --   APTT  --   --  35  --   --   LABPROT  --   --  13.4  --   --   INR  --   --  1.0  --   --   HEPARINUNFRC  --   --   --  0.35 0.54  CREATININE 1.07  --   --  1.03  --   TROPONINIHS 5 5  --   --   --    Assessment: 70 yo M with CP - PMHx of CAD, CABG in 2012, AAA, depression, PAD, HTN. Chest pain is typical substernal, that occurred at rest. Cardiology consulted, plan to do cardiac cath on Monday. Heparin consult in per pharmacy for ACS/STEMI.   Heparin level today is 0.54 and therapeutic. Hgb/HCT and PLT are stable and WNL. No IV site issues per RN. She notes that there is some slight more than normal bleeding from some wounds on patient's arm, but not concerning. No change, will monitor.    Goal of Therapy:  Heparin level 0.3-0.7 units/ml Monitor platelets by anticoagulation protocol: Yes   Plan:  Continue heparin at 1250 units/hr Daily heparin level and CBC Monitor for s/sx of bleeding  Thank you for involving pharmacy in this patient's care.  Elita Quick, PharmD PGY1 Ambulatory Care Pharmacy Resident 10/04/2020 11:50 AM  **Pharmacist phone directory can be found on [email or web address] listed under [place]**

## 2020-10-04 NOTE — Progress Notes (Signed)
  Echocardiogram 2D Echocardiogram has been performed.  Richard Parrish 10/04/2020, 3:40 PM

## 2020-10-04 NOTE — Progress Notes (Addendum)
Progress Note  Patient Name: MATHIUS BIRKELAND Date of Encounter: 10/04/2020  [facility] Cardiologist: Sinclair Grooms, MD   Subjective   No CP since admission Fell at home, elbow wound is bleeding 2nd heparin  Inpatient Medications    Scheduled Meds:  aspirin  325 mg Oral Daily   atorvastatin  80 mg Oral Daily   budesonide  6 mg Oral q AM   And   budesonide  3 mg Oral QHS   cholecalciferol  1,000 Units Oral Daily   feeding supplement  237 mL Oral BID BM   irbesartan  300 mg Oral Daily   metoprolol succinate  50 mg Oral Daily   pantoprazole  40 mg Oral QAC breakfast   PARoxetine  40 mg Oral Daily   tamsulosin  0.4 mg Oral BID   Continuous Infusions:  heparin 1,250 Units/hr (10/03/20 2033)   PRN Meds: acetaminophen, HYDROcodone-acetaminophen, hyoscyamine, melatonin, metaxalone, nitroGLYCERIN, ondansetron (ZOFRAN) IV   Vital Signs    Vitals:   10/03/20 2300 10/03/20 2330 10/04/20 0051 10/04/20 0511  BP: 121/75 124/76 (!) 143/90 99/68  Pulse: (!) 56 (!) 54 62 69  Resp:   20 19  Temp:   97.6 F (36.4 C) 97.6 F (36.4 C)  TempSrc:   Oral Oral  SpO2: 96% 95% 100% 100%  Weight:   97.1 kg 97.4 kg  Height:   6' (1.829 m)     Intake/Output Summary (Last 24 hours) at 10/04/2020 0820 Last data filed at 10/04/2020 0300 Gross per 24 hour  Intake 477.95 ml  Output --  Net 477.95 ml   Last 3 Weights 10/04/2020 10/04/2020 10/03/2020  Weight (lbs) 214 lb 11.2 oz 214 lb 1.6 oz 220 lb  Weight (kg) 97.387 kg 97.115 kg 99.791 kg      Telemetry    SR - Personally Reviewed  ECG    Sinus brady, HR 55 w/ one PVC, no acute changes, no sig change from  02/2020 - Personally Reviewed  Physical Exam   GEN: No acute distress.   Neck: No JVD Cardiac: RRR, no murmurs, rubs, or gallops.  Respiratory: Few rales bases bilaterally. GI: Soft, nontender, non-distended  MS: No edema; No deformity. L elbow wound w/ dressing in place Neuro:  Nonfocal  Psych: Normal affect   Labs     High Sensitivity Troponin:   Recent Labs  Lab 10/03/20 1327 10/03/20 1524  TROPONINIHS 5 5      Chemistry Recent Labs  Lab 10/03/20 1327 10/04/20 0235  NA 137 135  K 3.7 4.0  CL 104 103  CO2 24 23  GLUCOSE 175* 155*  BUN 14 17  CREATININE 1.07 1.03  CALCIUM 9.4 9.1  PROT 6.6  --   ALBUMIN 4.0  --   AST 25  --   ALT 21  --   ALKPHOS 77  --   BILITOT 1.2  --   GFRNONAA >60 >60  ANIONGAP 9 9     Hematology Recent Labs  Lab 10/03/20 1327 10/04/20 0235  WBC 5.4 7.0  RBC 4.57 4.11*  HGB 15.2 13.9  HCT 43.7 39.3  MCV 95.6 95.6  MCH 33.3 33.8  MCHC 34.8 35.4  RDW 12.8 13.0  PLT 318 260    BNP Recent Labs  Lab 10/03/20 1327  BNP 59.2    Lab Results  Component Value Date   CHOL 97 10/04/2020   HDL 35 (L) 10/04/2020   LDLCALC 53 10/04/2020   TRIG 46  10/04/2020   CHOLHDL 2.8 10/04/2020   No results found for: TSH Lab Results  Component Value Date   HGBA1C 5.3 10/03/2020    DDimer No results for input(s): DDIMER in the last 168 hours.   Radiology    DG Chest 2 View  Result Date: 10/03/2020 CLINICAL DATA:  Chest pain.  Shortness of breath. EXAM: CHEST - 2 VIEW COMPARISON:  04/30/2020 FINDINGS: Mildly displaced left seventh rib fracture is probably old but new since 04/30/2020. Scattered linear and interstitial densities in the lower lungs appear to be chronic. Heart size is normal and stable. Prior median sternotomy. No large pleural effusions. Degenerative endplate changes in the thoracic spine. IMPRESSION: Chronic changes in the lower lungs. Findings are compatible with areas of scarring and fibrosis. No acute findings. Age indeterminate left seventh rib fracture. Electronically Signed   By: Markus Daft M.D.   On: 10/03/2020 13:48    Cardiac Studies   ECHO: ordered  CARDIAC CATH: Monday  Patient Profile     70 y.o. male  with a hx of CAD, prior bypass graft to RCA for failed angioplasty , patent but diffusely diseased SVG to RCA document by  catheterization 2012 , severe degenerative disc disease, depression, abdominal aortic aneurysm ,  PAD with prior left superficial femoral and popliteal atherectomy in-stent, ILD/Lung fibrosis and hypertension, was admitted  08/06 for progressive/unstable angina.  Assessment & Plan    Unstable angina - ez neg MI - woke up w/ CP early am yesterday, relieved by nitro. Other sx had been exertional - on heparin, Plavix on hold, will decrease ASA to 81 mg qd - Cath in am, orders written  2. HLD - LDL is at goal  - continue home dose Lipitor 80 mg qd  3. Anxiety - restart home Xanax - continue Paxil   Principal Problem:   Unstable angina (HCC) Active Problems:   Essential hypertension, benign   CAD (coronary artery disease), native coronary artery   For questions or updates, please contact [facility] Please consult [email or web address] for contact info under        Signed, Rosaria Ferries, PA-C  10/04/2020, 8:20 AM    Pt seen and examined  I agree with findings as noteed by R Barrett above Pt remains pain free   ON exam,  Lungs are CTA Cardiac RRR   No S3   No signif murmurs Ext:   Excoriated areas arm and leg with bandages   SOme ooozing (on heparin)  Plan :    With known CAD and presentation plan for L heart cath tomorrow to redefine anatomy   Note that pt says he hasnt felt well for a few months  SOB with activity    Hopefullly tomorrow will elucidate reason  Dorris Carnes MD

## 2020-10-04 NOTE — Progress Notes (Signed)
[place] for heparin Indication: chest pain/ACS  Labs: Recent Labs    10/03/20 1327 10/03/20 1524 10/03/20 2039 10/04/20 0235  HGB 15.2  --   --  13.9  HCT 43.7  --   --  39.3  PLT 318  --   --  260  APTT  --   --  35  --   LABPROT  --   --  13.4  --   INR  --   --  1.0  --   HEPARINUNFRC  --   --   --  0.35  CREATININE 1.07  --   --  1.03  TROPONINIHS 5 5  --   --    Assessment: 70 yo M with CP - PMHx of CAD, CABG in 2012, AAA, depression, PAD, HTN. Chest pain is typical substernal, that occurred at rest. Cardiology consulted, plan to do cardiac cath on Monday. Heparin consult in per pharmacy for ACS/STEMI. H/H and PLT count wnl, no s/sx of bleeding. Heparin level this am 0.35 units/ml  Goal of Therapy:  Heparin level 0.3-0.7 units/ml Monitor platelets by anticoagulation protocol: Yes   Plan:  Continue heparin at 1250 units/hr Check heparin level later today to confirm  Thanks for allowing pharmacy to be a part of this patient's care.  Excell Seltzer, PharmD Clinical Pharmacist

## 2020-10-05 ENCOUNTER — Encounter (HOSPITAL_COMMUNITY)
Admission: EM | Disposition: A | Discharge status: Home / Self Care | Payer: Self-pay | Source: Home / Self Care | Attending: Internal Medicine

## 2020-10-05 DIAGNOSIS — R0602 Shortness of breath: Secondary | ICD-10-CM

## 2020-10-05 DIAGNOSIS — I25710 Atherosclerosis of autologous vein coronary artery bypass graft(s) with unstable angina pectoris: Secondary | ICD-10-CM

## 2020-10-05 DIAGNOSIS — E440 Moderate protein-calorie malnutrition: Secondary | ICD-10-CM | POA: Insufficient documentation

## 2020-10-05 DIAGNOSIS — I25110 Atherosclerotic heart disease of native coronary artery with unstable angina pectoris: Principal | ICD-10-CM

## 2020-10-05 HISTORY — PX: RIGHT/LEFT HEART CATH AND CORONARY/GRAFT ANGIOGRAPHY: CATH118267

## 2020-10-05 LAB — POCT I-STAT EG7
Acid-base deficit: 2 mmol/L (ref 0.0–2.0)
Bicarbonate: 23.7 mmol/L (ref 20.0–28.0)
Calcium, Ion: 1.26 mmol/L (ref 1.15–1.40)
HCT: 36 % — ABNORMAL LOW (ref 39.0–52.0)
Hemoglobin: 12.2 g/dL — ABNORMAL LOW (ref 13.0–17.0)
O2 Saturation: 70 %
Potassium: 3.8 mmol/L (ref 3.5–5.1)
Sodium: 142 mmol/L (ref 135–145)
TCO2: 25 mmol/L (ref 22–32)
pCO2, Ven: 42 mmHg — ABNORMAL LOW (ref 44.0–60.0)
pH, Ven: 7.361 (ref 7.250–7.430)
pO2, Ven: 38 mmHg (ref 32.0–45.0)

## 2020-10-05 LAB — POCT I-STAT 7, (LYTES, BLD GAS, ICA,H+H)
Acid-base deficit: 2 mmol/L (ref 0.0–2.0)
Bicarbonate: 23.4 mmol/L (ref 20.0–28.0)
Calcium, Ion: 1.3 mmol/L (ref 1.15–1.40)
HCT: 37 % — ABNORMAL LOW (ref 39.0–52.0)
Hemoglobin: 12.6 g/dL — ABNORMAL LOW (ref 13.0–17.0)
O2 Saturation: 99 %
Potassium: 3.9 mmol/L (ref 3.5–5.1)
Sodium: 141 mmol/L (ref 135–145)
TCO2: 25 mmol/L (ref 22–32)
pCO2 arterial: 41.9 mmHg (ref 32.0–48.0)
pH, Arterial: 7.355 (ref 7.350–7.450)
pO2, Arterial: 166 mmHg — ABNORMAL HIGH (ref 83.0–108.0)

## 2020-10-05 LAB — CBC
HCT: 40.2 % (ref 39.0–52.0)
Hemoglobin: 14.2 g/dL (ref 13.0–17.0)
MCH: 33.6 pg (ref 26.0–34.0)
MCHC: 35.3 g/dL (ref 30.0–36.0)
MCV: 95.3 fL (ref 80.0–100.0)
Platelets: 274 10*3/uL (ref 150–400)
RBC: 4.22 MIL/uL (ref 4.22–5.81)
RDW: 12.9 % (ref 11.5–15.5)
WBC: 8.5 10*3/uL (ref 4.0–10.5)
nRBC: 0 % (ref 0.0–0.2)

## 2020-10-05 LAB — HEPARIN LEVEL (UNFRACTIONATED): Heparin Unfractionated: 0.46 IU/mL (ref 0.30–0.70)

## 2020-10-05 SURGERY — RIGHT/LEFT HEART CATH AND CORONARY/GRAFT ANGIOGRAPHY
Anesthesia: LOCAL

## 2020-10-05 MED ORDER — LIDOCAINE HCL (PF) 1 % IJ SOLN
INTRAMUSCULAR | Status: DC | PRN
  Administered 2020-10-05 (×2): 2 mL

## 2020-10-05 MED ORDER — FENTANYL CITRATE (PF) 100 MCG/2ML IJ SOLN
INTRAMUSCULAR | Status: DC | PRN
  Administered 2020-10-05: 25 ug via INTRAVENOUS

## 2020-10-05 MED ORDER — VERAPAMIL HCL 2.5 MG/ML IV SOLN
INTRAVENOUS | Status: AC
  Filled 2020-10-05: qty 2

## 2020-10-05 MED ORDER — IOHEXOL 350 MG/ML SOLN
INTRAVENOUS | Status: DC | PRN
  Administered 2020-10-05: 60 mL

## 2020-10-05 MED ORDER — LIDOCAINE HCL (PF) 1 % IJ SOLN
INTRAMUSCULAR | Status: AC
  Filled 2020-10-05: qty 30

## 2020-10-05 MED ORDER — FENTANYL CITRATE (PF) 100 MCG/2ML IJ SOLN
INTRAMUSCULAR | Status: AC
  Filled 2020-10-05: qty 2

## 2020-10-05 MED ORDER — MIDAZOLAM HCL 2 MG/2ML IJ SOLN
INTRAMUSCULAR | Status: DC | PRN
  Administered 2020-10-05: 1 mg via INTRAVENOUS

## 2020-10-05 MED ORDER — SODIUM CHLORIDE 0.9 % IV SOLN: 250.0000 mL | INTRAVENOUS | Status: DC | PRN

## 2020-10-05 MED ORDER — ADULT MULTIVITAMIN W/MINERALS CH: 1.0000 | ORAL_TABLET | Freq: Every day | ORAL | Status: DC

## 2020-10-05 MED ORDER — ISOSORBIDE MONONITRATE ER 30 MG PO TB24: 30.0000 mg | ORAL_TABLET | Freq: Every day | ORAL | 11 refills | Status: DC

## 2020-10-05 MED ORDER — LABETALOL HCL 5 MG/ML IV SOLN: 10.0000 mg | INTRAVENOUS | Status: DC | PRN

## 2020-10-05 MED ORDER — HYDRALAZINE HCL 20 MG/ML IJ SOLN: 10.0000 mg | INTRAMUSCULAR | Status: DC | PRN

## 2020-10-05 MED ORDER — VERAPAMIL HCL 2.5 MG/ML IV SOLN
INTRAVENOUS | Status: DC | PRN
  Administered 2020-10-05: 10 mL via INTRA_ARTERIAL

## 2020-10-05 MED ORDER — SODIUM CHLORIDE 0.9% FLUSH: 3.0000 mL | INTRAVENOUS | Status: DC | PRN

## 2020-10-05 MED ORDER — SODIUM CHLORIDE 0.9% FLUSH: 3.0000 mL | Freq: Two times a day (BID) | INTRAVENOUS | Status: DC

## 2020-10-05 MED ORDER — HEPARIN (PORCINE) IN NACL 1000-0.9 UT/500ML-% IV SOLN
INTRAVENOUS | Status: DC | PRN
  Administered 2020-10-05 (×2): 500 mL

## 2020-10-05 MED ORDER — SODIUM CHLORIDE 0.9 % IV SOLN: INTRAVENOUS | Status: DC

## 2020-10-05 MED ORDER — HEPARIN (PORCINE) IN NACL 1000-0.9 UT/500ML-% IV SOLN
INTRAVENOUS | Status: AC
  Filled 2020-10-05: qty 1000

## 2020-10-05 MED ORDER — MIDAZOLAM HCL 2 MG/2ML IJ SOLN
INTRAMUSCULAR | Status: AC
  Filled 2020-10-05: qty 2

## 2020-10-05 MED ORDER — ENOXAPARIN SODIUM 40 MG/0.4ML IJ SOSY: 40.0000 mg | PREFILLED_SYRINGE | INTRAMUSCULAR | Status: DC

## 2020-10-05 MED ORDER — HEPARIN SODIUM (PORCINE) 1000 UNIT/ML IJ SOLN
INTRAMUSCULAR | Status: DC | PRN
  Administered 2020-10-05: 5000 [IU] via INTRAVENOUS

## 2020-10-05 MED ORDER — PANTOPRAZOLE SODIUM 40 MG PO TBEC: 40.0000 mg | DELAYED_RELEASE_TABLET | Freq: Every day | ORAL | 3 refills | Status: DC

## 2020-10-05 SURGICAL SUPPLY — 12 items
CATH 5FR JL3.5 JR4 ANG PIG MP (CATHETERS) ×2 IMPLANT
CATH BALLN WEDGE 5F 110CM (CATHETERS) ×2 IMPLANT
CATH INFINITI 6F MPA2 100CM (CATHETERS) ×2 IMPLANT
DEVICE RAD COMP TR BAND LRG (VASCULAR PRODUCTS) ×4 IMPLANT
GLIDESHEATH SLEND SS 6F .021 (SHEATH) ×2 IMPLANT
GUIDEWIRE INQWIRE 1.5J.035X260 (WIRE) ×1 IMPLANT
INQWIRE 1.5J .035X260CM (WIRE) ×2
KIT HEART LEFT (KITS) ×2 IMPLANT
PACK CARDIAC CATHETERIZATION (CUSTOM PROCEDURE TRAY) ×2 IMPLANT
SHEATH GLIDE SLENDER 4/5FR (SHEATH) ×2 IMPLANT
TRANSDUCER W/STOPCOCK (MISCELLANEOUS) ×2 IMPLANT
TUBING CIL FLEX 10 FLL-RA (TUBING) ×2 IMPLANT

## 2020-10-05 NOTE — Progress Notes (Addendum)
Progress Note  Patient Name: Richard Parrish Date of Encounter: 10/05/2020  [facility] Cardiologist: Sinclair Grooms, MD   Subjective   Shortness of breath with ambulation, shortness of breath.  No chest pain.  Heart cath later today.  On heparin and fluids.  Inpatient Medications    Scheduled Meds:  aspirin EC  81 mg Oral Daily   atorvastatin  80 mg Oral Daily   budesonide  6 mg Oral q AM   And   budesonide  3 mg Oral QHS   cholecalciferol  1,000 Units Oral Daily   feeding supplement  237 mL Oral BID BM   irbesartan  300 mg Oral Daily   metoprolol succinate  50 mg Oral Daily   pantoprazole  40 mg Oral QAC breakfast   PARoxetine  40 mg Oral Daily   sodium chloride flush  3 mL Intravenous Q12H   tamsulosin  0.4 mg Oral BID   Continuous Infusions:  sodium chloride     sodium chloride 1 mL/kg/hr (10/05/20 0538)   heparin 1,250 Units/hr (10/04/20 1342)   PRN Meds: sodium chloride, acetaminophen, ALPRAZolam, HYDROcodone-acetaminophen, hyoscyamine, melatonin, metaxalone, nitroGLYCERIN, ondansetron (ZOFRAN) IV, sodium chloride flush   Vital Signs    Vitals:   10/04/20 1133 10/04/20 2000 10/05/20 0516 10/05/20 0827  BP: 124/64 127/71 (!) 149/86 (!) 162/81  Pulse: (!) 58 60 (!) 57 (!) 57  Resp: 16 18 16    Temp: 97.6 F (36.4 C) 97.6 F (36.4 C) 97.6 F (36.4 C)   TempSrc: Oral Oral Oral   SpO2:  100% 100%   Weight:      Height:        Intake/Output Summary (Last 24 hours) at 10/05/2020 0855 Last data filed at 10/05/2020 0653 Gross per 24 hour  Intake 807.47 ml  Output 600 ml  Net 207.47 ml   Last 3 Weights 10/04/2020 10/04/2020 10/04/2020  Weight (lbs) 214 lb 1.4 oz 214 lb 11.2 oz 214 lb 1.6 oz  Weight (kg) 97.11 kg 97.387 kg 97.115 kg      Telemetry    Normal sinus rhythm- Personally Reviewed  ECG    Normal sinus rhythm- Personally Reviewed  Physical Exam   GEN: No acute distress.   Neck: No JVD Cardiac: RRR, no murmurs, rubs, or gallops.   Respiratory: Clear to auscultation bilaterally. GI: Soft, nontender, non-distended  MS: No edema; No deformity. Neuro:  Nonfocal  Psych: Normal affect   Labs    High Sensitivity Troponin:   Recent Labs  Lab 10/03/20 1327 10/03/20 1524  TROPONINIHS 5 5      Chemistry Recent Labs  Lab 10/03/20 1327 10/04/20 0235  NA 137 135  K 3.7 4.0  CL 104 103  CO2 24 23  GLUCOSE 175* 155*  BUN 14 17  CREATININE 1.07 1.03  CALCIUM 9.4 9.1  PROT 6.6  --   ALBUMIN 4.0  --   AST 25  --   ALT 21  --   ALKPHOS 77  --   BILITOT 1.2  --   GFRNONAA >60 >60  ANIONGAP 9 9     Hematology Recent Labs  Lab 10/03/20 1327 10/04/20 0235 10/05/20 0049  WBC 5.4 7.0 8.5  RBC 4.57 4.11* 4.22  HGB 15.2 13.9 14.2  HCT 43.7 39.3 40.2  MCV 95.6 95.6 95.3  MCH 33.3 33.8 33.6  MCHC 34.8 35.4 35.3  RDW 12.8 13.0 12.9  PLT 318 260 274    BNP Recent Labs  Lab 10/03/20 1327  BNP 59.2     DDimer No results for input(s): DDIMER in the last 168 hours.   Radiology    DG Chest 2 View  Result Date: 10/03/2020 CLINICAL DATA:  Chest pain.  Shortness of breath. EXAM: CHEST - 2 VIEW COMPARISON:  04/30/2020 FINDINGS: Mildly displaced left seventh rib fracture is probably old but new since 04/30/2020. Scattered linear and interstitial densities in the lower lungs appear to be chronic. Heart size is normal and stable. Prior median sternotomy. No large pleural effusions. Degenerative endplate changes in the thoracic spine. IMPRESSION: Chronic changes in the lower lungs. Findings are compatible with areas of scarring and fibrosis. No acute findings. Age indeterminate left seventh rib fracture. Electronically Signed   By: Markus Daft M.D.   On: 10/03/2020 13:48   ECHOCARDIOGRAM COMPLETE  Result Date: 10/04/2020    ECHOCARDIOGRAM REPORT   Patient Name:   Richard Parrish Date of Exam: 10/04/2020 Medical Rec #:  [id number]        Height:       72.0 in Accession #:    [id number]       Weight:       214.1 lb Date  of Birth:  [birth year]        BSA:          2.193 m Patient Age:    70 years         BP:           124/64 mmHg Patient Gender: M                HR:           58 bpm. Exam Location:  Inpatient Procedure: 2D Echo, Cardiac Doppler and Color Doppler Indications:    Chest pain  History:        Patient has no prior history of Echocardiogram examinations.                 CAD, Prior CABG, Signs/Symptoms:Chest Pain and Shortness of                 Breath; Risk Factors:Hypertension. PAD. DOE.  Sonographer:    Clayton Lefort RDCS (AE) Referring Phys: [id number] [place]  1. Left ventricular ejection fraction, by estimation, is 60 to 65%. The left ventricle has normal function. The left ventricle has no regional wall motion abnormalities. There is moderate left ventricular hypertrophy. Left ventricular diastolic parameters are consistent with Grade I diastolic dysfunction (impaired relaxation).  2. Right ventricular systolic function is mildly reduced at the apex. The right ventricular size is mildly enlarged. Tricuspid regurgitation signal is inadequate for assessing PA pressure.  3. The mitral valve is normal in structure. Trivial mitral valve regurgitation. No evidence of mitral stenosis.  4. The aortic valve is grossly normal. There is mild calcification of the aortic valve. Aortic valve regurgitation is not visualized. Mild aortic valve sclerosis is present, with no evidence of aortic valve stenosis.  5. The inferior vena cava is normal in size with greater than 50% respiratory variability, suggesting right atrial pressure of 3 mmHg. FINDINGS  Left Ventricle: Left ventricular ejection fraction, by estimation, is 60 to 65%. The left ventricle has normal function. The left ventricle has no regional wall motion abnormalities. The left ventricular internal cavity size was normal in size. There is  moderate left ventricular hypertrophy. Left ventricular diastolic parameters are consistent with Grade I diastolic  dysfunction (impaired relaxation). Right Ventricle: The right ventricular  size is mildly enlarged. No increase in right ventricular wall thickness. Right ventricular systolic function is mildly reduced. Tricuspid regurgitation signal is inadequate for assessing PA pressure. Left Atrium: Left atrial size was normal in size. Right Atrium: Right atrial size was normal in size. Pericardium: There is no evidence of pericardial effusion. Presence of pericardial fat pad. Mitral Valve: The mitral valve is normal in structure. Trivial mitral valve regurgitation. No evidence of mitral valve stenosis. MV peak gradient, 2.6 mmHg. The mean mitral valve gradient is 1.0 mmHg. Tricuspid Valve: The tricuspid valve is normal in structure. Tricuspid valve regurgitation is not demonstrated. No evidence of tricuspid stenosis. Aortic Valve: The aortic valve is grossly normal. There is mild calcification of the aortic valve. Aortic valve regurgitation is not visualized. Mild aortic valve sclerosis is present, with no evidence of aortic valve stenosis. Aortic valve mean gradient  measures 3.0 mmHg. Aortic valve peak gradient measures 4.8 mmHg. Aortic valve area, by VTI measures 3.50 cm. Pulmonic Valve: The pulmonic valve was normal in structure. Pulmonic valve regurgitation is not visualized. No evidence of pulmonic stenosis. Aorta: The aortic root is normal in size and structure. Venous: The inferior vena cava is normal in size with greater than 50% respiratory variability, suggesting right atrial pressure of 3 mmHg. IAS/Shunts: No atrial level shunt detected by color flow Doppler.  LEFT VENTRICLE PLAX 2D LVIDd:         4.40 cm  Diastology LVIDs:         2.90 cm  LV e' medial:    6.85 cm/s LV PW:         1.50 cm  LV E/e' medial:  8.6 LV IVS:        1.40 cm  LV e' lateral:   10.10 cm/s LVOT diam:     2.20 cm  LV E/e' lateral: 5.9 LV SV:         74 LV SV Index:   34 LVOT Area:     3.80 cm  RIGHT VENTRICLE RV Basal diam:  3.20 cm RV S  prime:     12.40 cm/s TAPSE (M-mode): 2.1 cm LEFT ATRIUM             Index       RIGHT ATRIUM           Index LA diam:        3.80 cm 1.73 cm/m  RA Area:     19.80 cm LA Vol (A2C):   50.5 ml 23.03 ml/m RA Volume:   56.40 ml  25.72 ml/m LA Vol (A4C):   45.2 ml 20.61 ml/m LA Biplane Vol: 51.6 ml 23.53 ml/m  AORTIC VALVE AV Area (Vmax):    3.37 cm AV Area (Vmean):   3.29 cm AV Area (VTI):     3.50 cm AV Vmax:           110.00 cm/s AV Vmean:          76.500 cm/s AV VTI:            0.212 m AV Peak Grad:      4.8 mmHg AV Mean Grad:      3.0 mmHg LVOT Vmax:         97.40 cm/s LVOT Vmean:        66.300 cm/s LVOT VTI:          0.195 m LVOT/AV VTI ratio: 0.92  AORTA Ao Root diam: 3.80 cm Ao Asc diam:  3.10 cm MITRAL VALVE  MV Area (PHT): 2.93 cm    SHUNTS MV Area VTI:   2.80 cm    Systemic VTI:  0.20 m MV Peak grad:  2.6 mmHg    Systemic Diam: 2.20 cm MV Mean grad:  1.0 mmHg MV Vmax:       0.81 m/s MV Vmean:      46.2 cm/s MV Decel Time: 259 msec MV E velocity: 59.20 cm/s MV A velocity: 74.60 cm/s MV E/A ratio:  0.79 Cherlynn Kaiser MD Electronically signed by Cherlynn Kaiser MD Signature Date/Time: 10/04/2020/5:28:25 PM    Final     Cardiac Studies   Echo 10/04/20 1. Left ventricular ejection fraction, by estimation, is 60 to 65%. The  left ventricle has normal function. The left ventricle has no regional  wall motion abnormalities. There is moderate left ventricular hypertrophy.  Left ventricular diastolic  parameters are consistent with Grade I diastolic dysfunction (impaired  relaxation).   2. Right ventricular systolic function is mildly reduced at the apex. The  right ventricular size is mildly enlarged. Tricuspid regurgitation signal  is inadequate for assessing PA pressure.   3. The mitral valve is normal in structure. Trivial mitral valve  regurgitation. No evidence of mitral stenosis.   4. The aortic valve is grossly normal. There is mild calcification of the  aortic valve. Aortic valve  regurgitation is not visualized. Mild aortic  valve sclerosis is present, with no evidence of aortic valve stenosis.   5. The inferior vena cava is normal in size with greater than 50%  respiratory variability, suggesting right atrial pressure of 3 mmHg.   Patient Profile     70 y.o. male with history of CAD s/p CABG, PAD (followed by vascular) with intervention, abdominal aortic aneurysm, hypertension, ILD who presented for evaluation of chest pain which woke him up from sleep.  Also noted to have significant dyspnea on exertion.  His symptoms are most concerning for unstable angina.  Assessment & Plan    Unstable angina -Troponin negative.  EKG without acute changes.  He symptoms most concerning for unstable angina and placed on IV heparin.  For cardiac catheterization later today. -Continue aspirin, statin and beta-blocker  The patient understands that risks include but are not limited to stroke (1 in 1000), death (1 in 1000), kidney failure [usually temporary] (1 in 500), bleeding (1 in 200), allergic reaction [possibly serious] (1 in 200), and agrees to proceed.     2.  Hypertension -Blood pressure stable on current medication  3.  Hyperlipidemia -10/04/2020: Cholesterol 97; HDL 35; LDL Cholesterol 53; Triglycerides 46; VLDL 9  -Continue Lipitor 80 mg daily  4.  Peripheral arterial disease -Followed by vascular  5.  ILD -Previously seen by pulmonology prior to moving to [facility].  He used to be on prednisone remotely.  If nonconclusive, consider pulmonary evaluation at some point.  For questions or updates, please contact [place] Please consult [email or web address] for contact info under   Signed, [name], PA  10/05/2020, 8:55 AM     I have personally seen and examined this patient. I agree with the assessment and plan as outlined above.  Pt with history of CAD s/p CABG. Admitted with dyspnea and chest pain. Troponin is negative. EKG without ischemic changes. Echo with  normal LV function with no WMA. No significant valve disease. Plans in place for cardiac cath today. All questions answered. He is NPO.   Lauree Chandler 10/05/2020 10:05 AM

## 2020-10-05 NOTE — Progress Notes (Signed)
Initial Nutrition Assessment  DOCUMENTATION CODES:   Non-severe (moderate) malnutrition in context of chronic illness  INTERVENTION:   Ensure Enlive po BID, each supplement provides 350 kcal and 20 grams of protein MVI with minerals daily  NUTRITION DIAGNOSIS:   Moderate Malnutrition related to chronic illness (CAD, pulmonary fibrosis) as evidenced by mild muscle depletion, mild fat depletion.  GOAL:   Patient will meet greater than or equal to 90% of their needs  MONITOR:   PO intake, Supplement acceptance  REASON FOR ASSESSMENT:   Malnutrition Screening Tool    ASSESSMENT:   70 yo male admitted with SOB. PMH includes CAD, depression, CAD, HTN, HLD, pulmonary fibrosis, PVD, GERD, PAD, OSA.  S/P cardiac cath today. Found to have severe single-vessel CAD with ostial CTO of RCA.  Patient states that he has lost 10-15 lbs within the past month. He is a member of [employer], working on losing weight, but for the past few months has had a decreased appetite. His wife usually prepares meals. For the past month, he has been eating only one meal per day. He agreed to drink Ensure supplements between meals to help prevent further loss of LBM.   Weight history reviewed. Weight is down by 6% over the past 3 months.  Labs reviewed.  Medications reviewed and include cholecalciferol, flomax.  NUTRITION - FOCUSED PHYSICAL EXAM:  Flowsheet Row Most Recent Value  Orbital Region Mild depletion  Upper Arm Region Mild depletion  Thoracic and Lumbar Region No depletion  Buccal Region No depletion  Temple Region No depletion  Clavicle Bone Region Mild depletion  Clavicle and Acromion Bone Region No depletion  Scapular Bone Region No depletion  Dorsal Hand No depletion  Patellar Region Mild depletion  Anterior Thigh Region Mild depletion  Posterior Calf Region Mild depletion  Edema (RD Assessment) None  Hair Reviewed  Eyes Reviewed  Mouth Reviewed  Skin Reviewed  Nails  Reviewed       Diet Order:   Diet Order     None       EDUCATION NEEDS:   Education needs have been addressed  Skin:  Skin Assessment: Reviewed RN Assessment (skin tears)  Last BM:  8/6  Height:   Ht Readings from Last 1 Encounters:  10/04/20 6' (1.829 m)    Weight:   Wt Readings from Last 1 Encounters:  10/04/20 97.1 kg    Ideal Body Weight:  80.9 kg  BMI:  Body mass index is 29.04 kg/m.  Estimated Nutritional Needs:   Kcal:  2000-2200  Protein:  100-115 gm  Fluid:  >/= 2 L    Lucas Mallow, RD, LDN, CNSC Please refer to Amion for contact information.

## 2020-10-05 NOTE — Plan of Care (Signed)
  Problem: Health Behavior/Discharge Planning: Goal: Ability to manage health-related needs will improve Outcome: Adequate for Discharge   Problem: Clinical Measurements: Goal: Ability to maintain clinical measurements within normal limits will improve Outcome: Adequate for Discharge Goal: Diagnostic test results will improve Outcome: Adequate for Discharge Goal: Cardiovascular complication will be avoided Outcome: Adequate for Discharge   Problem: Elimination: Goal: Will not experience complications related to bowel motility Outcome: Adequate for Discharge Goal: Will not experience complications related to urinary retention Outcome: Adequate for Discharge   Problem: Pain Managment: Goal: General experience of comfort will improve Outcome: Adequate for Discharge   Problem: Safety: Goal: Ability to remain free from injury will improve Outcome: Adequate for Discharge   Problem: Skin Integrity: Goal: Risk for impaired skin integrity will decrease Outcome: Adequate for Discharge   Problem: Education: Goal: Understanding of cardiac disease, CV risk reduction, and recovery process will improve Outcome: Adequate for Discharge Goal: Individualized Educational Video(s) Outcome: Adequate for Discharge   Problem: Activity: Goal: Ability to tolerate increased activity will improve Outcome: Adequate for Discharge   Problem: Cardiac: Goal: Ability to achieve and maintain adequate cardiovascular perfusion will improve Outcome: Adequate for Discharge   Problem: Health Behavior/Discharge Planning: Goal: Ability to safely manage health-related needs after discharge will improve Outcome: Adequate for Discharge

## 2020-10-05 NOTE — Discharge Summary (Addendum)
Discharge Summary    Patient ID: Richard Parrish MRN: [id number]; DOB: June 16, 1950  Admit date: 10/03/2020 Discharge date: 10/05/2020  PCP:  Seward Carol, MD   [facility] Providers Cardiologist:  Sinclair Grooms, MD        Discharge Diagnoses    Principal Problem:   Unstable angina [facility]) Active Problems:   Essential hypertension, benign   CAD (coronary artery disease), native coronary artery   Shortness of breath   Malnutrition of moderate degree   PAD   ILD  HLD   Diagnostic Studies/Procedures    Cath 10/05/20 FINDINGS: Severe single-vessel CAD with ostial CTO of RCA.  Mild luminal irregularities seen in left coronary artery. Patent SVG-RDA with patent mid-graft stent and moderate proximal/mid graft disease that is stable or minimally worse compared with prior catheterization in 2012. Normal left and right heart filling pressures.   RECOMMENDATIONS: Medical therapy and secondary prevention of coronary artery disease. Hemo Data  Flowsheet Row Most Recent Value  Fick Cardiac Output 5.21 L/min  Fick Cardiac Output Index 2.37 (L/min)/BSA  RA A Wave 4 mmHg  RA V Wave 4 mmHg  RA Mean 2 mmHg  RV Systolic Pressure 21 mmHg  RV Diastolic Pressure 1 mmHg  RV EDP 5 mmHg  PA Systolic Pressure 23 mmHg  PA Diastolic Pressure 8 mmHg  PA Mean 14 mmHg  PW A Wave 12 mmHg  PW V Wave 11 mmHg  PW Mean 10 mmHg  AO Systolic Pressure 330 mmHg  AO Diastolic Pressure 68 mmHg  AO Mean 96 mmHg  LV Systolic Pressure 076 mmHg  LV Diastolic Pressure 4 mmHg  LV EDP 15 mmHg  AOp Systolic Pressure 226 mmHg  AOp Diastolic Pressure 66 mmHg  AOp Mean Pressure 96 mmHg  LVp Systolic Pressure 333 mmHg  LVp Diastolic Pressure 0 mmHg  LVp EDP Pressure 8 mmHg  QP/QS 1  TPVR Index 5.89 HRUI  TSVR Index 40.4 HRUI  PVR SVR Ratio 0.04  TPVR/TSVR Ratio 0.15   Echo 10/04/20 1. Left ventricular ejection fraction, by estimation, is 60 to 65%. The  left ventricle has normal function. The left  ventricle has no regional  wall motion abnormalities. There is moderate left ventricular hypertrophy.  Left ventricular diastolic  parameters are consistent with Grade I diastolic dysfunction (impaired  relaxation).   2. Right ventricular systolic function is mildly reduced at the apex. The  right ventricular size is mildly enlarged. Tricuspid regurgitation signal  is inadequate for assessing PA pressure.   3. The mitral valve is normal in structure. Trivial mitral valve  regurgitation. No evidence of mitral stenosis.   4. The aortic valve is grossly normal. There is mild calcification of the  aortic valve. Aortic valve regurgitation is not visualized. Mild aortic  valve sclerosis is present, with no evidence of aortic valve stenosis.   5. The inferior vena cava is normal in size with greater than 50%  respiratory variability, suggesting right atrial pressure of 3 mmHg.   History of Present Illness     Richard Parrish is a 70 y.o. male with history of CAD s/p CABG, PAD (followed by vascular) with intervention, abdominal aortic aneurysm, hypertension, ILD who presented for evaluation of chest pain.  He has a known history of CAD with prior CABG to the RCA following failed PCI - has patent but diffusely diseased SVG to the RCA documented in 2012.  The patient reports he woke up with chest pain (2am), took 3-4 NTG with relief  of chest pain. No other associated symptoms or orthopnea, pnd, LE edema, dizziness, syncope.Has not had more episodes but since last few weeks he has been having exertional sob, fatigue while playing golf (avid golfer). One time he had to rest due to extreme symptoms and dizziness but no chest pain. Given his significant cardiac h/o and chest tightness, he came to the ER. Denies substance use. Fairly active- able to do ADLS but back pain is limiting to some extent.   ER eval: EKG non specific changes, trops x2 negative, cxr- chronic lung scarring, vitals stable, no chest  pain.  Hospital Course     Consultants: None  Unstable angina -Troponin negative.  EKG without acute changes.  He symptoms was most concerning for unstable angina and placed on IV heparin.  Echo with normal LVEF and no WMA.  Cath showed   Severe single-vessel CAD with ostial CTO of RCA.  Mild luminal irregularities seen in left coronary artery. Patent SVG-RDA with patent mid-graft stent and moderate proximal/mid graft disease that is stable or minimally worse compared with prior catheterization in 2012. Normal left and right heart filling pressures.  - Recommended medical therapy. Added Imdur 30mg  qd. Recommended pulmonary work with PCP/pulmonologist given DOE and hx of ILD.  - Patient will continue home DAPT>> further decision by Dr. Tamala Julian in outpatient setting.  -Continue statin and beta-blocker - The patient felt stable for discharge post bed rest    2.  Hypertension -Blood pressure stable on current medication   3.  Hyperlipidemia -10/04/2020: Cholesterol 97; HDL 35; LDL Cholesterol 53; Triglycerides 46; VLDL 9  -Continue Lipitor 80 mg daily   4.  Peripheral arterial disease -Followed by vascular   5.  ILD -Previously seen by pulmonology prior to moving to [facility].  He used to be on prednisone remotely.  Given non-conclusive cath, consider pulmonary evaluation at some point.  Did the patient have an acute coronary syndrome (MI, NSTEMI, STEMI, etc) this admission?:  No                               Did the patient have a percutaneous coronary intervention (stent / angioplasty)?:  No.       _____________  Discharge Vitals Blood pressure (!) 145/70, pulse 69, temperature 97.6 F (36.4 C), temperature source Oral, resp. rate 17, height 6' (1.829 m), weight 97.1 kg, SpO2 100 %.  Filed Weights   10/04/20 0051 10/04/20 0511 10/04/20 1048  Weight: 97.1 kg 97.4 kg 97.1 kg    Labs & Radiologic Studies    CBC Recent Labs    10/03/20 1327 10/04/20 0235 10/05/20 0049  WBC 5.4 7.0  8.5  NEUTROABS 4.1  --   --   HGB 15.2 13.9 14.2  HCT 43.7 39.3 40.2  MCV 95.6 95.6 95.3  PLT 318 260 786   Basic Metabolic Panel Recent Labs    10/03/20 1327 10/04/20 0235  NA 137 135  K 3.7 4.0  CL 104 103  CO2 24 23  GLUCOSE 175* 155*  BUN 14 17  CREATININE 1.07 1.03  CALCIUM 9.4 9.1   Liver Function Tests Recent Labs    10/03/20 1327  AST 25  ALT 21  ALKPHOS 77  BILITOT 1.2  PROT 6.6  ALBUMIN 4.0    High Sensitivity Troponin:   Recent Labs  Lab 10/03/20 1327 10/03/20 1524  TROPONINIHS 5 5   Hemoglobin A1C Recent Labs  10/03/20 1949  HGBA1C 5.3   Fasting Lipid Panel Recent Labs    10/04/20 0235  CHOL 97  HDL 35*  LDLCALC 53  TRIG 46  CHOLHDL 2.8    _____________  DG Chest 2 View  Result Date: 10/03/2020 CLINICAL DATA:  Chest pain.  Shortness of breath. EXAM: CHEST - 2 VIEW COMPARISON:  04/30/2020 FINDINGS: Mildly displaced left seventh rib fracture is probably old but new since 04/30/2020. Scattered linear and interstitial densities in the lower lungs appear to be chronic. Heart size is normal and stable. Prior median sternotomy. No large pleural effusions. Degenerative endplate changes in the thoracic spine. IMPRESSION: Chronic changes in the lower lungs. Findings are compatible with areas of scarring and fibrosis. No acute findings. Age indeterminate left seventh rib fracture. Electronically Signed   By: Markus Daft M.D.   On: 10/03/2020 13:48   ECHOCARDIOGRAM COMPLETE  Result Date: 10/04/2020    ECHOCARDIOGRAM REPORT   Patient Name:   Richard Parrish Date of Exam: 10/04/2020 Medical Rec #:  [id number]        Height:       72.0 in Accession #:    [id number]       Weight:       214.1 lb Date of Birth:  06/26/1950        BSA:          2.193 m Patient Age:    29 years         BP:           124/64 mmHg Patient Gender: M                HR:           58 bpm. Exam Location:  Inpatient Procedure: 2D Echo, Cardiac Doppler and Color Doppler Indications:     Chest pain  History:        Patient has no prior history of Echocardiogram examinations.                 CAD, Prior CABG, Signs/Symptoms:Chest Pain and Shortness of                 Breath; Risk Factors:Hypertension. PAD. DOE.  Sonographer:    Clayton Lefort RDCS (AE) Referring Phys: [id number] [facility]  1. Left ventricular ejection fraction, by estimation, is 60 to 65%. The left ventricle has normal function. The left ventricle has no regional wall motion abnormalities. There is moderate left ventricular hypertrophy. Left ventricular diastolic parameters are consistent with Grade I diastolic dysfunction (impaired relaxation).  2. Right ventricular systolic function is mildly reduced at the apex. The right ventricular size is mildly enlarged. Tricuspid regurgitation signal is inadequate for assessing PA pressure.  3. The mitral valve is normal in structure. Trivial mitral valve regurgitation. No evidence of mitral stenosis.  4. The aortic valve is grossly normal. There is mild calcification of the aortic valve. Aortic valve regurgitation is not visualized. Mild aortic valve sclerosis is present, with no evidence of aortic valve stenosis.  5. The inferior vena cava is normal in size with greater than 50% respiratory variability, suggesting right atrial pressure of 3 mmHg. FINDINGS  Left Ventricle: Left ventricular ejection fraction, by estimation, is 60 to 65%. The left ventricle has normal function. The left ventricle has no regional wall motion abnormalities. The left ventricular internal cavity size was normal in size. There is  moderate left ventricular hypertrophy. Left ventricular diastolic parameters are consistent with Grade I  diastolic dysfunction (impaired relaxation). Right Ventricle: The right ventricular size is mildly enlarged. No increase in right ventricular wall thickness. Right ventricular systolic function is mildly reduced. Tricuspid regurgitation signal is inadequate for assessing PA  pressure. Left Atrium: Left atrial size was normal in size. Right Atrium: Right atrial size was normal in size. Pericardium: There is no evidence of pericardial effusion. Presence of pericardial fat pad. Mitral Valve: The mitral valve is normal in structure. Trivial mitral valve regurgitation. No evidence of mitral valve stenosis. MV peak gradient, 2.6 mmHg. The mean mitral valve gradient is 1.0 mmHg. Tricuspid Valve: The tricuspid valve is normal in structure. Tricuspid valve regurgitation is not demonstrated. No evidence of tricuspid stenosis. Aortic Valve: The aortic valve is grossly normal. There is mild calcification of the aortic valve. Aortic valve regurgitation is not visualized. Mild aortic valve sclerosis is present, with no evidence of aortic valve stenosis. Aortic valve mean gradient  measures 3.0 mmHg. Aortic valve peak gradient measures 4.8 mmHg. Aortic valve area, by VTI measures 3.50 cm. Pulmonic Valve: The pulmonic valve was normal in structure. Pulmonic valve regurgitation is not visualized. No evidence of pulmonic stenosis. Aorta: The aortic root is normal in size and structure. Venous: The inferior vena cava is normal in size with greater than 50% respiratory variability, suggesting right atrial pressure of 3 mmHg. IAS/Shunts: No atrial level shunt detected by color flow Doppler.  LEFT VENTRICLE PLAX 2D LVIDd:         4.40 cm  Diastology LVIDs:         2.90 cm  LV e' medial:    6.85 cm/s LV PW:         1.50 cm  LV E/e' medial:  8.6 LV IVS:        1.40 cm  LV e' lateral:   10.10 cm/s LVOT diam:     2.20 cm  LV E/e' lateral: 5.9 LV SV:         74 LV SV Index:   34 LVOT Area:     3.80 cm  RIGHT VENTRICLE RV Basal diam:  3.20 cm RV S prime:     12.40 cm/s TAPSE (M-mode): 2.1 cm LEFT ATRIUM             Index       RIGHT ATRIUM           Index LA diam:        3.80 cm 1.73 cm/m  RA Area:     19.80 cm LA Vol (A2C):   50.5 ml 23.03 ml/m RA Volume:   56.40 ml  25.72 ml/m LA Vol (A4C):   45.2 ml 20.61  ml/m LA Biplane Vol: 51.6 ml 23.53 ml/m  AORTIC VALVE AV Area (Vmax):    3.37 cm AV Area (Vmean):   3.29 cm AV Area (VTI):     3.50 cm AV Vmax:           110.00 cm/s AV Vmean:          76.500 cm/s AV VTI:            0.212 m AV Peak Grad:      4.8 mmHg AV Mean Grad:      3.0 mmHg LVOT Vmax:         97.40 cm/s LVOT Vmean:        66.300 cm/s LVOT VTI:          0.195 m LVOT/AV VTI ratio: 0.92  AORTA Ao Root diam:  3.80 cm Ao Asc diam:  3.10 cm MITRAL VALVE MV Area (PHT): 2.93 cm    SHUNTS MV Area VTI:   2.80 cm    Systemic VTI:  0.20 m MV Peak grad:  2.6 mmHg    Systemic Diam: 2.20 cm MV Mean grad:  1.0 mmHg MV Vmax:       0.81 m/s MV Vmean:      46.2 cm/s MV Decel Time: 259 msec MV E velocity: 59.20 cm/s MV A velocity: 74.60 cm/s MV E/A ratio:  0.79 Cherlynn Kaiser MD Electronically signed by Cherlynn Kaiser MD Signature Date/Time: 10/04/2020/5:28:25 PM    Final    Disposition   Pt is being discharged home today in good condition.  Follow-up Plans & Appointments     Follow-up Information     Belva Crome, MD Follow up.   Specialty: Cardiology Why: office will call with date and time of appointment. Please call if did not heard in few days Contact information: [address] [place] [address] [phone]         Seward Carol, MD. Schedule an appointment as soon as possible for a visit in 1 week(s).   Specialty: Internal Medicine Why: for hospital follow up Contact information: [address] [place] [address] [phone]         Belva Crome, MD .   Specialty: Cardiology Contact information: [phone] [address] [place] [address] [phone]                Discharge Instructions     Diet - low sodium heart healthy   Complete by: As directed    Discharge instructions   Complete by: As directed    No driving for 48 hours. No lifting over 5 lbs for 1 week. No sexual activity for 1 week. You may return to  work on 10/12/20. Keep procedure site clean & dry. If you notice increased pain, swelling, bleeding or pus, call/return!  You may shower, but no soaking baths/hot tubs/pools for 1 week.   Some studies suggest Prilosec/Omeprazole interacts with Plavix. We changed your Prilosec/Omeprazole to Protonix for less chance of interaction.   Increase activity slowly   Complete by: As directed    No wound care   Complete by: As directed        Discharge Medications   Allergies as of 10/05/2020       Reactions   Ambien [zolpidem Tartrate] Other (See Comments)   Bad dreams/Vivid Dreams        Medication List     STOP taking these medications    omeprazole 20 MG tablet Commonly known as: PRILOSEC OTC Replaced by: pantoprazole 40 MG tablet       TAKE these medications    acetaminophen 500 MG tablet Commonly known as: TYLENOL Take 500 mg by mouth every 6 (six) hours as needed.   ALPRAZolam 0.25 MG tablet Commonly known as: XANAX Take 0.25 mg by mouth 2 (two) times daily as needed for anxiety.   aspirin EC 81 MG tablet Take 162 mg by mouth daily as needed for mild pain.   atorvastatin 80 MG tablet Commonly known as: LIPITOR TAKE 1 TABLET BY MOUTH EVERY DAY   budesonide 3 MG 24 hr capsule Commonly known as: ENTOCORT EC Take 3-6 mg by mouth See admin instructions. Takes 2 tablets in the morning and 1 tablet at night   cholecalciferol 25 MCG (1000 UNIT) tablet Commonly  known as: VITAMIN D3 Take 1,000 Units by mouth daily.   clopidogrel 75 MG tablet Commonly known as: PLAVIX TAKE 1 TABLET BY MOUTH EVERY DAY What changed: Another medication with the same name was removed. Continue taking this medication, and follow the directions you see here.   GLUCOSAMINE-MSM PO Take 1 tablet by mouth daily.   HYDROcodone-acetaminophen 5-325 MG tablet Commonly known as: NORCO/VICODIN Take 1 tablet by mouth 5 (five) times daily.   hyoscyamine 0.125 MG tablet Commonly known as:  LEVSIN Take 0.125 mg by mouth every 4 (four) hours as needed for cramping.   isosorbide mononitrate 30 MG 24 hr tablet Commonly known as: IMDUR Take 1 tablet (30 mg total) by mouth daily.   metaxalone 800 MG tablet Commonly known as: SKELAXIN Take 800 mg by mouth 3 (three) times daily as needed for muscle spasms.   metoprolol succinate 50 MG 24 hr tablet Commonly known as: TOPROL-XL TAKE 1 TABLET BY MOUTH EVERY DAY AFTER MEAL What changed: See the new instructions.   nitroGLYCERIN 0.4 MG SL tablet Commonly known as: NITROSTAT Place 0.4 mg under the tongue every 5 (five) minutes as needed for chest pain.   pantoprazole 40 MG tablet Commonly known as: PROTONIX Take 1 tablet (40 mg total) by mouth daily before breakfast. Start taking on: October 06, 2020 Replaces: omeprazole 20 MG tablet   PARoxetine 40 MG tablet Commonly known as: PAXIL Take 40 mg by mouth daily.   PROBIOTIC PO Take 1 capsule by mouth daily.   tamsulosin 0.4 MG Caps capsule Commonly known as: FLOMAX Take 1 capsule by mouth 2 (two) times daily.   valsartan 320 MG tablet Commonly known as: DIOVAN Take 1 tablet (320 mg total) by mouth daily.           Outstanding Labs/Studies   None  Duration of Discharge Encounter   Greater than 30 minutes including physician time.  SignedCrista Luria [place], PA 10/05/2020, 3:08 PM  I have personally seen and examined this patient. I agree with the assessment and plan as outlined above.  See my full note this am.  Cardiac cath with stable CAD.  Start Imdur D/C home today.   Lauree Chandler, MD, [facility] 10/05/2020 3:27 PM

## 2020-10-05 NOTE — Progress Notes (Signed)
[facility] for heparin Indication: chest pain/ACS  Labs: Recent Labs    10/03/20 1327 10/03/20 1524 10/03/20 2039 10/04/20 0235 10/04/20 0942 10/05/20 0049  HGB 15.2  --   --  13.9  --  14.2  HCT 43.7  --   --  39.3  --  40.2  PLT 318  --   --  260  --  274  APTT  --   --  35  --   --   --   LABPROT  --   --  13.4  --   --   --   INR  --   --  1.0  --   --   --   HEPARINUNFRC  --   --   --  0.35 0.54 0.46  CREATININE 1.07  --   --  1.03  --   --   TROPONINIHS 5 5  --   --   --   --    Assessment: 70 yo M with CP - PMHx of CAD, CABG in 2012, AAA, depression, PAD, HTN. Chest pain is typical substernal, that occurred at rest. Cardiology consulted, plans for  cardiac cath today. -heparin level at goal on 1250 units/hr  Goal of Therapy:  Heparin level 0.3-0.7 units/ml Monitor platelets by anticoagulation protocol: Yes   Plan:  Continue heparin at 1250 units/hr Will follow plans post cath  Thanks for allowing pharmacy to be a part of this patient's care.  Hildred Laser, PharmD Clinical Pharmacist **Pharmacist phone directory can now be found on [email or web address] ([email or web address]).  Listed under [place].  w

## 2020-10-05 NOTE — H&P (View-Only) (Signed)
Progress Note  Patient Name: Richard Parrish Date of Encounter: 10/05/2020  [facility] Cardiologist: Sinclair Grooms, MD   Subjective   Shortness of breath with ambulation, shortness of breath.  No chest pain.  Heart cath later today.  On heparin and fluids.  Inpatient Medications    Scheduled Meds:  aspirin EC  81 mg Oral Daily   atorvastatin  80 mg Oral Daily   budesonide  6 mg Oral q AM   And   budesonide  3 mg Oral QHS   cholecalciferol  1,000 Units Oral Daily   feeding supplement  237 mL Oral BID BM   irbesartan  300 mg Oral Daily   metoprolol succinate  50 mg Oral Daily   pantoprazole  40 mg Oral QAC breakfast   PARoxetine  40 mg Oral Daily   sodium chloride flush  3 mL Intravenous Q12H   tamsulosin  0.4 mg Oral BID   Continuous Infusions:  sodium chloride     sodium chloride 1 mL/kg/hr (10/05/20 0538)   heparin 1,250 Units/hr (10/04/20 1342)   PRN Meds: sodium chloride, acetaminophen, ALPRAZolam, HYDROcodone-acetaminophen, hyoscyamine, melatonin, metaxalone, nitroGLYCERIN, ondansetron (ZOFRAN) IV, sodium chloride flush   Vital Signs    Vitals:   10/04/20 1133 10/04/20 2000 10/05/20 0516 10/05/20 0827  BP: 124/64 127/71 (!) 149/86 (!) 162/81  Pulse: (!) 58 60 (!) 57 (!) 57  Resp: 16 18 16    Temp: 97.6 F (36.4 C) 97.6 F (36.4 C) 97.6 F (36.4 C)   TempSrc: Oral Oral Oral   SpO2:  100% 100%   Weight:      Height:        Intake/Output Summary (Last 24 hours) at 10/05/2020 0855 Last data filed at 10/05/2020 0653 Gross per 24 hour  Intake 807.47 ml  Output 600 ml  Net 207.47 ml   Last 3 Weights 10/04/2020 10/04/2020 10/04/2020  Weight (lbs) 214 lb 1.4 oz 214 lb 11.2 oz 214 lb 1.6 oz  Weight (kg) 97.11 kg 97.387 kg 97.115 kg      Telemetry    Normal sinus rhythm- Personally Reviewed  ECG    Normal sinus rhythm- Personally Reviewed  Physical Exam   GEN: No acute distress.   Neck: No JVD Cardiac: RRR, no murmurs, rubs, or gallops.   Respiratory: Clear to auscultation bilaterally. GI: Soft, nontender, non-distended  MS: No edema; No deformity. Neuro:  Nonfocal  Psych: Normal affect   Labs    High Sensitivity Troponin:   Recent Labs  Lab 10/03/20 1327 10/03/20 1524  TROPONINIHS 5 5      Chemistry Recent Labs  Lab 10/03/20 1327 10/04/20 0235  NA 137 135  K 3.7 4.0  CL 104 103  CO2 24 23  GLUCOSE 175* 155*  BUN 14 17  CREATININE 1.07 1.03  CALCIUM 9.4 9.1  PROT 6.6  --   ALBUMIN 4.0  --   AST 25  --   ALT 21  --   ALKPHOS 77  --   BILITOT 1.2  --   GFRNONAA >60 >60  ANIONGAP 9 9     Hematology Recent Labs  Lab 10/03/20 1327 10/04/20 0235 10/05/20 0049  WBC 5.4 7.0 8.5  RBC 4.57 4.11* 4.22  HGB 15.2 13.9 14.2  HCT 43.7 39.3 40.2  MCV 95.6 95.6 95.3  MCH 33.3 33.8 33.6  MCHC 34.8 35.4 35.3  RDW 12.8 13.0 12.9  PLT 318 260 274    BNP Recent Labs  Lab 10/03/20 1327  BNP 59.2     DDimer No results for input(s): DDIMER in the last 168 hours.   Radiology    DG Chest 2 View  Result Date: 10/03/2020 CLINICAL DATA:  Chest pain.  Shortness of breath. EXAM: CHEST - 2 VIEW COMPARISON:  04/30/2020 FINDINGS: Mildly displaced left seventh rib fracture is probably old but new since 04/30/2020. Scattered linear and interstitial densities in the lower lungs appear to be chronic. Heart size is normal and stable. Prior median sternotomy. No large pleural effusions. Degenerative endplate changes in the thoracic spine. IMPRESSION: Chronic changes in the lower lungs. Findings are compatible with areas of scarring and fibrosis. No acute findings. Age indeterminate left seventh rib fracture. Electronically Signed   By: Markus Daft M.D.   On: 10/03/2020 13:48   ECHOCARDIOGRAM COMPLETE  Result Date: 10/04/2020    ECHOCARDIOGRAM REPORT   Patient Name:   Richard Parrish Date of Exam: 10/04/2020 Medical Rec #:  [id number]        Height:       72.0 in Accession #:    [id number]       Weight:       214.1 lb Date  of Birth:  07/09/1950        BSA:          2.193 m Patient Age:    70 years         BP:           124/64 mmHg Patient Gender: M                HR:           58 bpm. Exam Location:  Inpatient Procedure: 2D Echo, Cardiac Doppler and Color Doppler Indications:    Chest pain  History:        Patient has no prior history of Echocardiogram examinations.                 CAD, Prior CABG, Signs/Symptoms:Chest Pain and Shortness of                 Breath; Risk Factors:Hypertension. PAD. DOE.  Sonographer:    Clayton Lefort RDCS (AE) Referring Phys: [id number] [place]  1. Left ventricular ejection fraction, by estimation, is 60 to 65%. The left ventricle has normal function. The left ventricle has no regional wall motion abnormalities. There is moderate left ventricular hypertrophy. Left ventricular diastolic parameters are consistent with Grade I diastolic dysfunction (impaired relaxation).  2. Right ventricular systolic function is mildly reduced at the apex. The right ventricular size is mildly enlarged. Tricuspid regurgitation signal is inadequate for assessing PA pressure.  3. The mitral valve is normal in structure. Trivial mitral valve regurgitation. No evidence of mitral stenosis.  4. The aortic valve is grossly normal. There is mild calcification of the aortic valve. Aortic valve regurgitation is not visualized. Mild aortic valve sclerosis is present, with no evidence of aortic valve stenosis.  5. The inferior vena cava is normal in size with greater than 50% respiratory variability, suggesting right atrial pressure of 3 mmHg. FINDINGS  Left Ventricle: Left ventricular ejection fraction, by estimation, is 60 to 65%. The left ventricle has normal function. The left ventricle has no regional wall motion abnormalities. The left ventricular internal cavity size was normal in size. There is  moderate left ventricular hypertrophy. Left ventricular diastolic parameters are consistent with Grade I diastolic  dysfunction (impaired relaxation). Right Ventricle: The right ventricular  size is mildly enlarged. No increase in right ventricular wall thickness. Right ventricular systolic function is mildly reduced. Tricuspid regurgitation signal is inadequate for assessing PA pressure. Left Atrium: Left atrial size was normal in size. Right Atrium: Right atrial size was normal in size. Pericardium: There is no evidence of pericardial effusion. Presence of pericardial fat pad. Mitral Valve: The mitral valve is normal in structure. Trivial mitral valve regurgitation. No evidence of mitral valve stenosis. MV peak gradient, 2.6 mmHg. The mean mitral valve gradient is 1.0 mmHg. Tricuspid Valve: The tricuspid valve is normal in structure. Tricuspid valve regurgitation is not demonstrated. No evidence of tricuspid stenosis. Aortic Valve: The aortic valve is grossly normal. There is mild calcification of the aortic valve. Aortic valve regurgitation is not visualized. Mild aortic valve sclerosis is present, with no evidence of aortic valve stenosis. Aortic valve mean gradient  measures 3.0 mmHg. Aortic valve peak gradient measures 4.8 mmHg. Aortic valve area, by VTI measures 3.50 cm. Pulmonic Valve: The pulmonic valve was normal in structure. Pulmonic valve regurgitation is not visualized. No evidence of pulmonic stenosis. Aorta: The aortic root is normal in size and structure. Venous: The inferior vena cava is normal in size with greater than 50% respiratory variability, suggesting right atrial pressure of 3 mmHg. IAS/Shunts: No atrial level shunt detected by color flow Doppler.  LEFT VENTRICLE PLAX 2D LVIDd:         4.40 cm  Diastology LVIDs:         2.90 cm  LV e' medial:    6.85 cm/s LV PW:         1.50 cm  LV E/e' medial:  8.6 LV IVS:        1.40 cm  LV e' lateral:   10.10 cm/s LVOT diam:     2.20 cm  LV E/e' lateral: 5.9 LV SV:         74 LV SV Index:   34 LVOT Area:     3.80 cm  RIGHT VENTRICLE RV Basal diam:  3.20 cm RV S  prime:     12.40 cm/s TAPSE (M-mode): 2.1 cm LEFT ATRIUM             Index       RIGHT ATRIUM           Index LA diam:        3.80 cm 1.73 cm/m  RA Area:     19.80 cm LA Vol (A2C):   50.5 ml 23.03 ml/m RA Volume:   56.40 ml  25.72 ml/m LA Vol (A4C):   45.2 ml 20.61 ml/m LA Biplane Vol: 51.6 ml 23.53 ml/m  AORTIC VALVE AV Area (Vmax):    3.37 cm AV Area (Vmean):   3.29 cm AV Area (VTI):     3.50 cm AV Vmax:           110.00 cm/s AV Vmean:          76.500 cm/s AV VTI:            0.212 m AV Peak Grad:      4.8 mmHg AV Mean Grad:      3.0 mmHg LVOT Vmax:         97.40 cm/s LVOT Vmean:        66.300 cm/s LVOT VTI:          0.195 m LVOT/AV VTI ratio: 0.92  AORTA Ao Root diam: 3.80 cm Ao Asc diam:  3.10 cm MITRAL VALVE  MV Area (PHT): 2.93 cm    SHUNTS MV Area VTI:   2.80 cm    Systemic VTI:  0.20 m MV Peak grad:  2.6 mmHg    Systemic Diam: 2.20 cm MV Mean grad:  1.0 mmHg MV Vmax:       0.81 m/s MV Vmean:      46.2 cm/s MV Decel Time: 259 msec MV E velocity: 59.20 cm/s MV A velocity: 74.60 cm/s MV E/A ratio:  0.79 Cherlynn Kaiser MD Electronically signed by Cherlynn Kaiser MD Signature Date/Time: 10/04/2020/5:28:25 PM    Final     Cardiac Studies   Echo 10/04/20 1. Left ventricular ejection fraction, by estimation, is 60 to 65%. The  left ventricle has normal function. The left ventricle has no regional  wall motion abnormalities. There is moderate left ventricular hypertrophy.  Left ventricular diastolic  parameters are consistent with Grade I diastolic dysfunction (impaired  relaxation).   2. Right ventricular systolic function is mildly reduced at the apex. The  right ventricular size is mildly enlarged. Tricuspid regurgitation signal  is inadequate for assessing PA pressure.   3. The mitral valve is normal in structure. Trivial mitral valve  regurgitation. No evidence of mitral stenosis.   4. The aortic valve is grossly normal. There is mild calcification of the  aortic valve. Aortic valve  regurgitation is not visualized. Mild aortic  valve sclerosis is present, with no evidence of aortic valve stenosis.   5. The inferior vena cava is normal in size with greater than 50%  respiratory variability, suggesting right atrial pressure of 3 mmHg.   Patient Profile     70 y.o. male with history of CAD s/p CABG, PAD (followed by vascular) with intervention, abdominal aortic aneurysm, hypertension, ILD who presented for evaluation of chest pain which woke him up from sleep.  Also noted to have significant dyspnea on exertion.  His symptoms are most concerning for unstable angina.  Assessment & Plan    Unstable angina -Troponin negative.  EKG without acute changes.  He symptoms most concerning for unstable angina and placed on IV heparin.  For cardiac catheterization later today. -Continue aspirin, statin and beta-blocker  The patient understands that risks include but are not limited to stroke (1 in 1000), death (1 in 1000), kidney failure [usually temporary] (1 in 500), bleeding (1 in 200), allergic reaction [possibly serious] (1 in 200), and agrees to proceed.     2.  Hypertension -Blood pressure stable on current medication  3.  Hyperlipidemia -10/04/2020: Cholesterol 97; HDL 35; LDL Cholesterol 53; Triglycerides 46; VLDL 9  -Continue Lipitor 80 mg daily  4.  Peripheral arterial disease -Followed by vascular  5.  ILD -Previously seen by pulmonology prior to moving to [facility].  He used to be on prednisone remotely.  If nonconclusive, consider pulmonary evaluation at some point.  For questions or updates, please contact [place] Please consult [email or web address] for contact info under   Signed, [name], PA  10/05/2020, 8:55 AM     I have personally seen and examined this patient. I agree with the assessment and plan as outlined above.  Pt with history of CAD s/p CABG. Admitted with dyspnea and chest pain. Troponin is negative. EKG without ischemic changes. Echo with  normal LV function with no WMA. No significant valve disease. Plans in place for cardiac cath today. All questions answered. He is NPO.   Lauree Chandler 10/05/2020 10:05 AM

## 2020-10-05 NOTE — Interval H&P Note (Signed)
History and Physical Interval Note:  10/05/2020 11:48 AM  Richard Parrish  has presented today for surgery, with the diagnosis of unstable angina.  The various methods of treatment have been discussed with the patient and family. After consideration of risks, benefits and other options for treatment, the patient has consented to  Procedure(s): RIGHT/LEFT HEART CATH AND CORONARY/GRAFT ANGIOGRAPHY (N/A) as a surgical intervention.  The patient's history has been reviewed, patient examined, no change in status, stable for surgery.  Given significant shortness of breath and history of interstitial lung disease, we will also perform a right heart catheterization.  I have reviewed the patient's chart and labs.  Questions were answered to the patient's satisfaction.    Cath Lab Visit (complete for each Cath Lab visit)  Clinical Evaluation Leading to the Procedure:   ACS: Yes.    Non-ACS:  N/A  Carmelle Bamberg

## 2020-10-05 NOTE — Brief Op Note (Signed)
BRIEF CARDIAC CATHETERIZATION NOTE  10/05/2020  12:38 PM  PATIENT:  Richard Parrish  70 y.o. male  PRE-OPERATIVE DIAGNOSIS:  Unstable angina and shortness of breath  POST-OPERATIVE DIAGNOSIS:  Same  PROCEDURE:  Procedure(s): RIGHT/LEFT HEART CATH AND CORONARY/GRAFT ANGIOGRAPHY (N/A)  SURGEON:  Surgeon(s) and Role:    * Juno Bozard, MD - Primary  FINDINGS: Severe single-vessel CAD with ostial CTO of RCA.  Mild luminal irregularities seen in left coronary artery. Patent SVG-RDA with patent mid-graft stent and moderate proximal/mid graft disease that is stable or minimally worse compared with prior catheterization in 2012. Normal left and right heart filling pressures.  RECOMMENDATIONS: Medical therapy and secondary prevention of coronary artery disease.  Nelva Bush, MD [facility]

## 2020-10-06 ENCOUNTER — Encounter (HOSPITAL_COMMUNITY): Payer: Self-pay | Admitting: Internal Medicine

## 2020-10-07 ENCOUNTER — Other Ambulatory Visit: Payer: Self-pay | Admitting: Orthopaedic Surgery

## 2020-10-07 DIAGNOSIS — M4726 Other spondylosis with radiculopathy, lumbar region: Secondary | ICD-10-CM

## 2020-10-08 ENCOUNTER — Other Ambulatory Visit: Payer: Self-pay | Admitting: Interventional Cardiology

## 2020-10-13 DIAGNOSIS — E782 Mixed hyperlipidemia: Secondary | ICD-10-CM | POA: Diagnosis not present

## 2020-10-13 DIAGNOSIS — G8929 Other chronic pain: Secondary | ICD-10-CM | POA: Diagnosis not present

## 2020-10-13 DIAGNOSIS — F329 Major depressive disorder, single episode, unspecified: Secondary | ICD-10-CM | POA: Diagnosis not present

## 2020-10-13 DIAGNOSIS — I10 Essential (primary) hypertension: Secondary | ICD-10-CM | POA: Diagnosis not present

## 2020-10-13 DIAGNOSIS — G4700 Insomnia, unspecified: Secondary | ICD-10-CM | POA: Diagnosis not present

## 2020-10-13 DIAGNOSIS — F322 Major depressive disorder, single episode, severe without psychotic features: Secondary | ICD-10-CM | POA: Diagnosis not present

## 2020-10-13 DIAGNOSIS — E7800 Pure hypercholesterolemia, unspecified: Secondary | ICD-10-CM | POA: Diagnosis not present

## 2020-10-13 DIAGNOSIS — K219 Gastro-esophageal reflux disease without esophagitis: Secondary | ICD-10-CM | POA: Diagnosis not present

## 2020-10-13 DIAGNOSIS — I2510 Atherosclerotic heart disease of native coronary artery without angina pectoris: Secondary | ICD-10-CM | POA: Diagnosis not present

## 2020-10-15 ENCOUNTER — Ambulatory Visit
Admission: RE | Admit: 2020-10-15 | Discharge: 2020-10-15 | Disposition: A | Discharge status: Home / Self Care | Payer: Medicare Other | Source: Ambulatory Visit | Attending: Orthopaedic Surgery | Admitting: Orthopaedic Surgery

## 2020-10-15 DIAGNOSIS — M4726 Other spondylosis with radiculopathy, lumbar region: Secondary | ICD-10-CM

## 2020-10-15 DIAGNOSIS — M47816 Spondylosis without myelopathy or radiculopathy, lumbar region: Secondary | ICD-10-CM | POA: Diagnosis not present

## 2020-10-15 DIAGNOSIS — M48061 Spinal stenosis, lumbar region without neurogenic claudication: Secondary | ICD-10-CM | POA: Diagnosis not present

## 2020-10-15 DIAGNOSIS — M5126 Other intervertebral disc displacement, lumbar region: Secondary | ICD-10-CM | POA: Diagnosis not present

## 2020-10-15 MED ORDER — GADOBUTROL 1 MMOL/ML IV SOLN
20.0000 mL | Freq: Once | INTRAVENOUS | Status: AC | PRN
  Administered 2020-10-15: 20 mL via INTRAVENOUS

## 2020-10-19 DIAGNOSIS — F322 Major depressive disorder, single episode, severe without psychotic features: Secondary | ICD-10-CM | POA: Diagnosis not present

## 2020-10-19 DIAGNOSIS — R0600 Dyspnea, unspecified: Secondary | ICD-10-CM | POA: Diagnosis not present

## 2020-10-19 DIAGNOSIS — I2510 Atherosclerotic heart disease of native coronary artery without angina pectoris: Secondary | ICD-10-CM | POA: Diagnosis not present

## 2020-11-04 DIAGNOSIS — J849 Interstitial pulmonary disease, unspecified: Secondary | ICD-10-CM | POA: Diagnosis not present

## 2020-11-04 DIAGNOSIS — F322 Major depressive disorder, single episode, severe without psychotic features: Secondary | ICD-10-CM | POA: Diagnosis not present

## 2020-11-04 DIAGNOSIS — G8929 Other chronic pain: Secondary | ICD-10-CM | POA: Diagnosis not present

## 2020-11-04 DIAGNOSIS — I739 Peripheral vascular disease, unspecified: Secondary | ICD-10-CM | POA: Diagnosis not present

## 2020-11-04 DIAGNOSIS — I10 Essential (primary) hypertension: Secondary | ICD-10-CM | POA: Diagnosis not present

## 2020-11-04 DIAGNOSIS — F17290 Nicotine dependence, other tobacco product, uncomplicated: Secondary | ICD-10-CM | POA: Diagnosis not present

## 2020-11-04 DIAGNOSIS — I2510 Atherosclerotic heart disease of native coronary artery without angina pectoris: Secondary | ICD-10-CM | POA: Diagnosis not present

## 2020-11-12 DIAGNOSIS — M961 Postlaminectomy syndrome, not elsewhere classified: Secondary | ICD-10-CM | POA: Diagnosis not present

## 2020-11-12 DIAGNOSIS — M48062 Spinal stenosis, lumbar region with neurogenic claudication: Secondary | ICD-10-CM | POA: Diagnosis not present

## 2020-11-12 DIAGNOSIS — Z6829 Body mass index (BMI) 29.0-29.9, adult: Secondary | ICD-10-CM | POA: Diagnosis not present

## 2020-11-12 DIAGNOSIS — M4716 Other spondylosis with myelopathy, lumbar region: Secondary | ICD-10-CM | POA: Diagnosis not present

## 2020-11-20 ENCOUNTER — Encounter (HOSPITAL_BASED_OUTPATIENT_CLINIC_OR_DEPARTMENT_OTHER): Payer: Self-pay | Admitting: Family

## 2020-11-20 ENCOUNTER — Other Ambulatory Visit: Payer: Self-pay

## 2020-11-20 ENCOUNTER — Ambulatory Visit (INDEPENDENT_AMBULATORY_CARE_PROVIDER_SITE_OTHER): Payer: Medicare Other | Admitting: Family

## 2020-11-20 VITALS — BP 136/80 | HR 68 | Ht 72.0 in | Wt 218.7 lb

## 2020-11-20 DIAGNOSIS — I10 Essential (primary) hypertension: Secondary | ICD-10-CM | POA: Diagnosis not present

## 2020-11-20 DIAGNOSIS — I25118 Atherosclerotic heart disease of native coronary artery with other forms of angina pectoris: Secondary | ICD-10-CM | POA: Diagnosis not present

## 2020-11-20 DIAGNOSIS — E785 Hyperlipidemia, unspecified: Secondary | ICD-10-CM | POA: Diagnosis not present

## 2020-11-20 DIAGNOSIS — I739 Peripheral vascular disease, unspecified: Secondary | ICD-10-CM | POA: Diagnosis not present

## 2020-11-20 NOTE — Patient Instructions (Addendum)
Medication Instructions:  Continue your current medications.   *If you need a refill on your cardiac medications before your next appointment, please call your pharmacy*  Lab Work: None ordered today.   Testing/Procedures: Your recent cardiac catheterization showed mild plaque build up in your previously placed graft but no significant blockage. The Isosorbide mononitrate that was started int he hospital will help to open up that blood vessel, prevent chest pain, and help the heart to work more efficiently.   Follow-Up: At [facility], you and your health needs are our priority.  As part of our continuing mission to provide you with exceptional heart care, we have created designated Provider Care Teams.  These Care Teams include your primary Cardiologist (physician) and Advanced Practice Providers (APPs -  Physician Assistants and Nurse Practitioners) who all work together to provide you with the care you need, when you need it.  We recommend signing up for the patient portal called "MyChart".  Sign up information is provided on this After Visit Summary.  MyChart is used to connect with patients for Virtual Visits (Telemedicine).  Patients are able to view lab/test results, encounter notes, upcoming appointments, etc.  Non-urgent messages can be sent to your provider as well.   To learn more about what you can do with MyChart, go to [email or web address].    Your next appointment:   6 month(s)  The format for your next appointment:   In Person  Provider:   You may see Sinclair Grooms, MD or one of the following Advanced Practice Providers on your designated Care Team:   Cecilie Kicks, NP Loel Dubonnet, NP    Other Instructions  Heart Healthy Diet Recommendations: A low-salt diet is recommended. Meats should be grilled, baked, or boiled. Avoid fried foods. Focus on lean protein sources like fish or chicken with vegetables and fruits. The American Heart Association is a Educational psychologist!    Exercise recommendations: The American Heart Association recommends 150 minutes of moderate intensity exercise weekly. Try 30 minutes of moderate intensity exercise 4-5 times per week. This could include walking, jogging, or swimming.   Tips to Measure your Blood Pressure Correctly Check your blood pressure at least 2-3 times per week and keep a log.   Here's what you can do to ensure a correct reading:  Don't drink a caffeinated beverage or smoke during the 30 minutes before the test.  Sit quietly for five minutes before the test begins.  During the measurement, sit in a chair with your feet on the floor and your arm supported so your elbow is at about heart level.  The inflatable part of the cuff should completely cover at least 80% of your upper arm, and the cuff should be placed on bare skin, not over a shirt.  Don't talk during the measurement.  Have your blood pressure measured twice, with a brief break in between. If the readings are different by 5 points or more, have it done a third time.  In 2017, new guidelines from the [employer], the [employer] of Cardiology, and nine other health organizations lowered the diagnosis of high blood pressure to 130/80 mm Hg or higher for all adults. The guidelines also redefined the various blood pressure categories to now include normal, elevated, Stage 1 hypertension, Stage 2 hypertension, and hypertensive crisis (see "Blood pressure categories").  Blood pressure categories  Blood pressure category SYSTOLIC (upper number)  DIASTOLIC (lower number)  Normal Less than 120 mm Hg and  Less than 80 mm Hg  Elevated 120-129 mm Hg and Less than 80 mm Hg  High blood pressure: Stage 1 hypertension 130-139 mm Hg or 80-89 mm Hg  High blood pressure: Stage 2 hypertension 140 mm Hg or higher or 90 mm Hg or higher  Hypertensive crisis (consult your doctor immediately) Higher than 180 mm Hg and/or Higher than 120 mm Hg   Source: American Heart Association and American Stroke Association. For more on getting your blood pressure under control, buy Controlling Your Blood Pressure, a Special Health Report from [facility].   Blood Pressure Log   Date   Time  Blood Pressure  Position  Example: Nov 1 9 AM 124/78 sitting

## 2020-11-20 NOTE — Progress Notes (Signed)
Office Visit    Patient Name: Richard Parrish Date of Encounter: 11/20/2020  PCP:  Richard Carol, MD   [place]  Cardiologist:  Richard Grooms, MD  Advanced Practice Provider:  No care team member to display Electrophysiologist:  None      Chief Complaint    Richard Parrish is a 70 y.o. male with a hx of CAD s/p CABG, PAD followed by vascular surgery, AAA, hypertension, ILD presents today for hospital follow-up  Past Medical History    Past Medical History:  Diagnosis Date   Anxiety    Arthritis    "lower back" (04/07/2017)   BPH (benign prostatic hypertrophy)    C. difficile diarrhea    CAD (coronary artery disease)    Chronic lower back pain    Coronary artery disease 1989   cabg with dvg to rca    Depression    GERD (gastroesophageal reflux disease)    Hyperlipidemia    Hypertension    Insomnia    Leg pain    Myocardial infarction ([place]) 2002   OSA on CPAP    cpap setting of 60 per pt   Osteoarthritis    "hips" (04/07/2017)   PAD (peripheral artery disease) ([place])    Peripheral vascular disease ([place])    Pneumonia 1989   S/P CABG   Pulmonary fibrosis ([place])    "one time" (04/07/2017)   Spinal stenosis    Past Surgical History:  Procedure Laterality Date   ABDOMINAL AORTAGRAM N/A 03/15/2011   Procedure: ABDOMINAL Richard Parrish;  Surgeon: Richard Mitchell, MD;  Location: [facility] CATH LAB;  Service: Cardiovascular;  Laterality: N/A;   ABDOMINAL AORTAGRAM N/A 04/12/2011   Procedure: ABDOMINAL Richard Parrish;  Surgeon: Richard Mitchell, MD;  Location: [facility] CATH LAB;  Service: Cardiovascular;  Laterality: N/A;   BACK SURGERY     CARDIAC CATHETERIZATION  08/1987   CORONARY ANGIOPLASTY WITH STENT PLACEMENT  ~ 2002   [facility], [place]   CORONARY ARTERY BYPASS GRAFT  09/1987   "CABG X1";  [facility]; [place]; "RCA"   JOINT REPLACEMENT     KNEE ARTHROSCOPY Left    LAPAROSCOPIC CHOLECYSTECTOMY     PERIPHERAL VASCULAR INTERVENTION Left 2016    "put a stent; right branch of left femoral"   POSTERIOR LUMBAR FUSION  2009   lumbar fusion, S L 5 to L 4   RIGHT/LEFT HEART CATH AND CORONARY/GRAFT ANGIOGRAPHY N/A 10/05/2020   Procedure: RIGHT/LEFT HEART CATH AND CORONARY/GRAFT ANGIOGRAPHY;  Surgeon: Richard Bush, MD;  Location: [place] CV LAB;  Service: Cardiovascular;  Laterality: N/A;   SHOULDER ARTHROSCOPY W/ ROTATOR CUFF REPAIR Right 2006   SHOULDER ARTHROSCOPY WITH SUBACROMIAL DECOMPRESSION AND OPEN ROTATOR C Left ~ [place]     TOTAL HIP ARTHROPLASTY Right 2012   TOTAL HIP ARTHROPLASTY Left 04/07/2017   TOTAL HIP ARTHROPLASTY Left 04/07/2017   Procedure: LEFT TOTAL HIP ARTHROPLASTY;  Surgeon: Richard Server, MD;  Location: [place];  Service: Orthopedics;  Laterality: Left;    Allergies  Allergies  Allergen Reactions   Ambien [Zolpidem Tartrate] Other (See Comments)    Bad dreams/Vivid Dreams    History of Present Illness    Richard Parrish is a 70 y.o. male with a hx of CAD s/p CABG, PAD followed by vascular surgery, AAA, hypertension, ILD last seen while hospitalized.  He has a history of CAD with prior CABG to the RCA following failed PCI.  He has also had DES to that graft.  Previous cardiac catheterization in 2012 documented diffusely diseased SVG to the RCA.  He was last seen 08/03/2020 by Richard Parrish doing well from a cardiac perspective without chest pain and staying active by playing golf.  He was having no claudication symptoms.  He was recommended for follow-up in 1 year.   He presented to the hospital 10/03/2020 with chest pain.  EKG showed nonspecific changes, troponins negative x2, chest x-ray with lung scarring.  Echo showed normal LVEF and no wall motion abnormalities.  Cardiac catheterization showed severe single-vessel CAD with ostial CTO of RCA.  Mild luminal irregularities seen in left coronary artery.  Patent SVG-R DA with patent mid graft stent and moderate proximal/mid graft  disease which was stable or minimally worse compared to prior catheterization in 2012.  Imdur 30 mg daily was added.  He was encouraged to follow-up with pulmonology given ILD.  He presents today for follow-up.  Endorses feeling overall well since hospital discharge.  Cardiac catheterization site healing well without pain. Reports no shortness of breath nor dyspnea on exertion. Reports no chest pain, pressure, or tightness. No edema, orthopnea, PND. Reports no palpitations.  Tells me no longer golfing as it gets colder but staying active walking, working around his home, working outdoors.   EKGs/Labs/Other Studies Reviewed:   The following studies were reviewed today:  Cath 10/05/20 FINDINGS: Severe single-vessel CAD with ostial CTO of RCA.  Mild luminal irregularities seen in left coronary artery. Patent SVG-RDA with patent mid-graft stent and moderate proximal/mid graft disease that is stable or minimally worse compared with prior catheterization in 2012. Normal left and right heart filling pressures.   RECOMMENDATIONS: Medical therapy and secondary prevention of coronary artery disease. Hemo Data   Flowsheet Row Most Recent Value  Fick Cardiac Output 5.21 L/min  Fick Cardiac Output Index 2.37 (L/min)/BSA  RA A Wave 4 mmHg  RA V Wave 4 mmHg  RA Mean 2 mmHg  RV Systolic Pressure 21 mmHg  RV Diastolic Pressure 1 mmHg  RV EDP 5 mmHg  PA Systolic Pressure 23 mmHg  PA Diastolic Pressure 8 mmHg  PA Mean 14 mmHg  PW A Wave 12 mmHg  PW V Wave 11 mmHg  PW Mean 10 mmHg  AO Systolic Pressure 629 mmHg  AO Diastolic Pressure 68 mmHg  AO Mean 96 mmHg  LV Systolic Pressure 476 mmHg  LV Diastolic Pressure 4 mmHg  LV EDP 15 mmHg  AOp Systolic Pressure 546 mmHg  AOp Diastolic Pressure 66 mmHg  AOp Mean Pressure 96 mmHg  LVp Systolic Pressure 503 mmHg  LVp Diastolic Pressure 0 mmHg  LVp EDP Pressure 8 mmHg  QP/QS 1  TPVR Index 5.89 HRUI  TSVR Index 40.4 HRUI  PVR SVR Ratio 0.04   TPVR/TSVR Ratio 0.15    Echo 10/04/20 1. Left ventricular ejection fraction, by estimation, is 60 to 65%. The  left ventricle has normal function. The left ventricle has no regional  wall motion abnormalities. There is moderate left ventricular hypertrophy.  Left ventricular diastolic  parameters are consistent with Grade I diastolic dysfunction (impaired  relaxation).   2. Right ventricular systolic function is mildly reduced at the apex. The  right ventricular size is mildly enlarged. Tricuspid regurgitation signal  is inadequate for assessing PA pressure.   3. The mitral valve is normal in structure. Trivial mitral valve  regurgitation. No evidence of mitral stenosis.   4. The aortic valve is grossly normal. There  is mild calcification of the  aortic valve. Aortic valve regurgitation is not visualized. Mild aortic  valve sclerosis is present, with no evidence of aortic valve stenosis.   5. The inferior vena cava is normal in size with greater than 50%  respiratory variability, suggesting right atrial pressure of 3 mmHg.    EKG: No EKG today  Recent Labs: 10/03/2020: ALT 21; B Natriuretic Peptide 59.2 10/04/2020: BUN 17; Creatinine, Ser 1.03 10/05/2020: Hemoglobin 12.6; Platelets 274; Potassium 3.9; Sodium 141  Recent Lipid Panel    Component Value Date/Time   CHOL 97 10/04/2020 0235   CHOL 131 02/19/2020 0820   TRIG 46 10/04/2020 0235   HDL 35 (L) 10/04/2020 0235   HDL 45 02/19/2020 0820   CHOLHDL 2.8 10/04/2020 0235   VLDL 9 10/04/2020 0235   LDLCALC 53 10/04/2020 0235   LDLCALC 73 02/19/2020 0820    Home Medications   Current Meds  Medication Sig   acetaminophen (TYLENOL) 500 MG tablet Take 500 mg by mouth every 6 (six) hours as needed.   ALPRAZolam (XANAX) 0.25 MG tablet Take 0.25 mg by mouth 2 (two) times daily as needed for anxiety.   atorvastatin (LIPITOR) 80 MG tablet TAKE 1 TABLET BY MOUTH EVERY DAY   budesonide (ENTOCORT EC) 3 MG 24 hr capsule Take 3-6 mg by  mouth See admin instructions. Takes 2 tablets in the morning and 1 tablet at night   cholecalciferol (VITAMIN D3) 25 MCG (1000 UNIT) tablet Take 1,000 Units by mouth daily.   clopidogrel (PLAVIX) 75 MG tablet TAKE 1 TABLET BY MOUTH EVERY DAY   Glucosamine HCl-MSM (GLUCOSAMINE-MSM PO) Take 1 tablet by mouth daily.   HYDROcodone-acetaminophen (NORCO) 7.5-325 MG tablet Take 1 tablet by mouth every 4 (four) hours as needed.   hyoscyamine (LEVSIN, ANASPAZ) 0.125 MG tablet Take 0.125 mg by mouth every 4 (four) hours as needed for cramping.    isosorbide mononitrate (IMDUR) 30 MG 24 hr tablet Take 1 tablet (30 mg total) by mouth daily.   metaxalone (SKELAXIN) 800 MG tablet Take 800 mg by mouth 3 (three) times daily as needed for muscle spasms.   metoprolol succinate (TOPROL-XL) 50 MG 24 hr tablet TAKE 1 TABLET BY MOUTH EVERY DAY AFTER A MEAL   nitroGLYCERIN (NITROSTAT) 0.4 MG SL tablet Place 0.4 mg under the tongue every 5 (five) minutes as needed for chest pain.   pantoprazole (PROTONIX) 40 MG tablet Take 1 tablet (40 mg total) by mouth daily before breakfast.   PARoxetine (PAXIL) 40 MG tablet Take 40 mg by mouth daily.   Probiotic Product (PROBIOTIC PO) Take 1 capsule by mouth daily.   tamsulosin (FLOMAX) 0.4 MG CAPS capsule Take 1 capsule by mouth 2 (two) times daily.   valsartan (DIOVAN) 320 MG tablet Take 1 tablet (320 mg total) by mouth daily.     Review of Systems      All other systems reviewed and are otherwise negative except as noted above.  Physical Exam    VS:  BP 136/80 (BP Location: Left Arm, Patient Position: Sitting, Cuff Size: Normal)   Pulse 68   Ht 6' (1.829 m)   Wt 218 lb 11.2 oz (99.2 kg)   SpO2 98%   BMI 29.66 kg/m  , BMI Body mass index is 29.66 kg/m.  Wt Readings from Last 3 Encounters:  11/20/20 218 lb 11.2 oz (99.2 kg)  10/04/20 214 lb 1.4 oz (97.1 kg)  08/10/20 227 lb (103 kg)    GEN: Well  nourished, well developed, in no acute distress. HEENT:  normal. Neck: Supple, no JVD, carotid bruits, or masses. Cardiac: RRR, no murmurs, rubs, or gallops. No clubbing, cyanosis, edema.  Radials/PT 2+ and equal bilaterally.  Respiratory:  Respirations regular and unlabored, clear to auscultation bilaterally. GI: Soft, nontender, nondistended. MS: No deformity or atrophy. Skin: Warm and dry, no rash. Neuro:  Strength and sensation are intact. Psych: Normal affect.  Assessment & Plan    CAD -stable with no anginal symptoms.  Recent cardiac catheterization with stable disease and no interventional target.  GDMT includes clopidogrel, Imdur, atorvastatin.  Omeprazole has been appropriately transition to pantoprazole due to potential interaction with clopidogrel. Heart healthy diet and regular cardiovascular exercise encouraged.    HTN -BP mildly elevated today in clinic but reports it is well controlled at home.  Encouraged home monitoring and he will contact us for blood pressure consistently greater than 130/80.  Continue present antihypertensive regimen including Toprol 50 mg daily, Imdur 30 mg daily, valsartan 320 mg daily.  If additional control needed consider up titration of Imdur.  HLD< LDL goal <70 -10/04/2020 total cholesterol 97, HDL 35, LDL 53, triglyceride 46.  Continue atorvastatin 80 mg daily.  Disposition: Follow up in 6 month(s) with Richard Parrish or APP.  Signed, Loel Dubonnet, NP 11/20/2020, 1:01 PM [place]

## 2020-11-24 DIAGNOSIS — M48062 Spinal stenosis, lumbar region with neurogenic claudication: Secondary | ICD-10-CM | POA: Diagnosis not present

## 2020-11-25 DIAGNOSIS — Z23 Encounter for immunization: Secondary | ICD-10-CM | POA: Diagnosis not present

## 2020-11-26 ENCOUNTER — Other Ambulatory Visit: Payer: Self-pay

## 2020-11-26 ENCOUNTER — Encounter: Payer: Self-pay | Admitting: Pulmonary Disease

## 2020-11-26 ENCOUNTER — Ambulatory Visit (INDEPENDENT_AMBULATORY_CARE_PROVIDER_SITE_OTHER): Payer: Medicare Other | Admitting: Pulmonary Disease

## 2020-11-26 VITALS — BP 118/78 | HR 81 | Temp 97.6°F | Ht 72.0 in | Wt 219.6 lb

## 2020-11-26 DIAGNOSIS — J8410 Pulmonary fibrosis, unspecified: Secondary | ICD-10-CM | POA: Diagnosis not present

## 2020-11-26 DIAGNOSIS — J849 Interstitial pulmonary disease, unspecified: Secondary | ICD-10-CM

## 2020-11-26 DIAGNOSIS — I200 Unstable angina: Secondary | ICD-10-CM

## 2020-11-26 NOTE — Patient Instructions (Signed)
We will get some labs today for further work-up of interstitial lung disease. We will give you questionnaire to check for exposures Schedule high-res CT and PFTs Follow-up in clinic after these tests to plan for next steps.

## 2020-11-26 NOTE — Progress Notes (Signed)
Richard Parrish    [id number]    09/10/1950  Primary Care Physician:Polite, Jori Moll, MD  Referring Physician: Seward Carol, MD [address] [place],  [place] [address]  Chief complaint: Consult for interstitial lung disease  HPI: 70 year old with history of pulmonary fibrosis, GERD, hypertension, hyperlipidemia, pulmonary fibrosis He was diagnosed with unspecified pulmonary fibrosis and was seen in pulmonary clinic in 2015  Developed COVID-19 in March 2021 and reports worsening dyspnea since then.  Complains of chronic dyspnea on exertion, cough.  No fevers or chills  He has chronic back pain for which he is on multiple pain medications, severe depression as a result of this.  He had to give up golfing which he loves due to back issues.  Pets: 3 cats Occupation: Used to work in Librarian, academic at [employer] Exposures: Exposed to [employer] carrier. Smoking history: 30-pack-year smoker.  Quit in 1989.  Currently vapes which helps with his back. Travel history: No significant recent travel history Relevant family history: No family history of lung disease   Outpatient Encounter Medications as of 11/26/2020  Medication Sig   acetaminophen (TYLENOL) 500 MG tablet Take 500 mg by mouth every 6 (six) hours as needed.   atorvastatin (LIPITOR) 80 MG tablet TAKE 1 TABLET BY MOUTH EVERY DAY   budesonide (ENTOCORT EC) 3 MG 24 hr capsule Take 3-6 mg by mouth See admin instructions. Takes 2 tablets in the morning and 1 tablet at night   cholecalciferol (VITAMIN D3) 25 MCG (1000 UNIT) tablet Take 1,000 Units by mouth daily.   clopidogrel (PLAVIX) 75 MG tablet TAKE 1 TABLET BY MOUTH EVERY DAY   Glucosamine HCl-MSM (GLUCOSAMINE-MSM PO) Take 1 tablet by mouth daily.   HYDROcodone-acetaminophen (NORCO) 7.5-325 MG tablet Take 1 tablet by mouth every 4 (four) hours as needed.   hyoscyamine (LEVSIN, ANASPAZ) 0.125 MG tablet Take 0.125 mg by mouth every 4 (four) hours as  needed for cramping.    isosorbide mononitrate (IMDUR) 30 MG 24 hr tablet Take 1 tablet (30 mg total) by mouth daily.   metoprolol succinate (TOPROL-XL) 50 MG 24 hr tablet TAKE 1 TABLET BY MOUTH EVERY DAY AFTER A MEAL   nitroGLYCERIN (NITROSTAT) 0.4 MG SL tablet Place 0.4 mg under the tongue every 5 (five) minutes as needed for chest pain.   pantoprazole (PROTONIX) 40 MG tablet Take 1 tablet (40 mg total) by mouth daily before breakfast.   PARoxetine (PAXIL) 40 MG tablet Take 40 mg by mouth daily.   Probiotic Product (PROBIOTIC PO) Take 1 capsule by mouth daily.   tamsulosin (FLOMAX) 0.4 MG CAPS capsule Take 1 capsule by mouth 2 (two) times daily.   valsartan (DIOVAN) 320 MG tablet Take 1 tablet (320 mg total) by mouth daily.   ALPRAZolam (XANAX) 0.25 MG tablet Take 0.25 mg by mouth 2 (two) times daily as needed for anxiety.   metaxalone (SKELAXIN) 800 MG tablet Take 800 mg by mouth 3 (three) times daily as needed for muscle spasms.   No facility-administered encounter medications on file as of 11/26/2020.    Allergies as of 11/26/2020 - Review Complete 11/26/2020  Allergen Reaction Noted   Ambien [zolpidem tartrate] Other (See Comments) 08/20/2013    Past Medical History:  Diagnosis Date   Anxiety    Arthritis    "lower back" (04/07/2017)   BPH (benign prostatic hypertrophy)    C. difficile diarrhea    CAD (coronary artery disease)  Chronic lower back pain    Coronary artery disease 1989   cabg with dvg to rca    Depression    GERD (gastroesophageal reflux disease)    Hyperlipidemia    Hypertension    Insomnia    Leg pain    Myocardial infarction ([place]) 2002   OSA on CPAP    cpap setting of 60 per pt   Osteoarthritis    "hips" (04/07/2017)   PAD (peripheral artery disease) ([place])    Peripheral vascular disease ([place])    Pneumonia 1989   S/P CABG   Pulmonary fibrosis ([place])    "one time" (04/07/2017)   Spinal stenosis     Past Surgical History:  Procedure Laterality Date    ABDOMINAL AORTAGRAM N/A 03/15/2011   Procedure: ABDOMINAL Maxcine Ham;  Surgeon: Serafina Mitchell, MD;  Location: [facility] CATH LAB;  Service: Cardiovascular;  Laterality: N/A;   ABDOMINAL AORTAGRAM N/A 04/12/2011   Procedure: ABDOMINAL Maxcine Ham;  Surgeon: Serafina Mitchell, MD;  Location: [facility] CATH LAB;  Service: Cardiovascular;  Laterality: N/A;   BACK SURGERY     CARDIAC CATHETERIZATION  08/1987   CORONARY ANGIOPLASTY WITH STENT PLACEMENT  ~ 2002   [facility], [place]   CORONARY ARTERY BYPASS GRAFT  09/1987   "CABG X1";  [facility]; [place]; "RCA"   JOINT REPLACEMENT     KNEE ARTHROSCOPY Left    LAPAROSCOPIC CHOLECYSTECTOMY     PERIPHERAL VASCULAR INTERVENTION Left 2016   "put a stent; right branch of left femoral"   POSTERIOR LUMBAR FUSION  2009   lumbar fusion, S L 5 to L 4   RIGHT/LEFT HEART CATH AND CORONARY/GRAFT ANGIOGRAPHY N/A 10/05/2020   Procedure: RIGHT/LEFT HEART CATH AND CORONARY/GRAFT ANGIOGRAPHY;  Surgeon: Nelva Bush, MD;  Location: [place] CV LAB;  Service: Cardiovascular;  Laterality: N/A;   SHOULDER ARTHROSCOPY W/ ROTATOR CUFF REPAIR Right 2006   SHOULDER ARTHROSCOPY WITH SUBACROMIAL DECOMPRESSION AND OPEN ROTATOR C Left ~ [place]     TOTAL HIP ARTHROPLASTY Right 2012   TOTAL HIP ARTHROPLASTY Left 04/07/2017   TOTAL HIP ARTHROPLASTY Left 04/07/2017   Procedure: LEFT TOTAL HIP ARTHROPLASTY;  Surgeon: Earlie Server, MD;  Location: [place];  Service: Orthopedics;  Laterality: Left;    Family History  Problem Relation Age of Onset   Heart disease Mother    Cancer Father     Social History   Socioeconomic History   Marital status: Married    Spouse name: Not on file   Number of children: Not on file   Years of education: Not on file   Highest education level: Not on file  Occupational History   Occupation: Retired  Tobacco Use   Smoking status: Former    Packs/day: 2.00    Years: 15.00    Pack years: 30.00    Types:  Cigarettes    Quit date: 03/01/1987    Years since quitting: 33.7   Smokeless tobacco: Never  Vaping Use   Vaping Use: Never used  Substance and Sexual Activity   Alcohol use: Yes    Comment: 04/07/2017 'couple drinks/month"   Drug use: No   Sexual activity: Not Currently  Other Topics Concern   Not on file  Social History Narrative   ** Merged History Encounter **       Social Determinants of Health   Financial Resource Strain: Not on file  Food Insecurity: Not on file  Transportation Needs: Not on file  Physical Activity: Not on file  Stress: Not on file  Social Connections: Not on file  Intimate Partner Violence: Not on file    Review of systems: Review of Systems  Constitutional: Negative for fever and chills.  HENT: Negative.   Eyes: Negative for blurred vision.  Respiratory: as per HPI  Cardiovascular: Negative for chest pain and palpitations.  Gastrointestinal: Negative for vomiting, diarrhea, blood per rectum. Genitourinary: Negative for dysuria, urgency, frequency and hematuria.  Musculoskeletal: Negative for myalgias, back pain and joint pain.  Skin: Negative for itching and rash.  Neurological: Negative for dizziness, tremors, focal weakness, seizures and loss of consciousness.  Endo/Heme/Allergies: Negative for environmental allergies.  Psychiatric/Behavioral: Negative for depression, suicidal ideas and hallucinations.  All other systems reviewed and are negative.  Physical Exam: Blood pressure 118/78, pulse 81, temperature 97.6 F (36.4 C), temperature source Oral, height 6' (1.829 m), weight 219 lb 9.6 oz (99.6 kg), SpO2 99 %. 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  Probable UIP pattern CT abdomen pelvis 02/07/17- basal pulmonary fibrosis. I have reviewed the images personally  PFTs: 11/27/2020 FVC 4.03 [85%], FEV1 2.95 [84%], F/F 73, TLC 6.09 [81%], DLCO 20.01 [73%] Minimal diffusion defect.  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  Has not been followed up since 2015 PFTs today surprisingly show only minimal diffusion defect  We will get further work-up done with CTD serologies, ILD questionnaire Schedule high-res CT  Plan/Recommendations: Labs, ILD questionnaire High-res CT  Marshell Garfinkel MD [facility] 11/26/2020, 10:41 AM  CC: Seward Carol, MD

## 2020-11-27 ENCOUNTER — Ambulatory Visit: Payer: Medicare Other

## 2020-11-27 ENCOUNTER — Encounter: Payer: Self-pay | Admitting: Pulmonary Disease

## 2020-11-27 DIAGNOSIS — J8410 Pulmonary fibrosis, unspecified: Secondary | ICD-10-CM

## 2020-11-27 LAB — PULMONARY FUNCTION TEST
DL/VA % pred: 88 %
DL/VA: 3.53 ml/min/mmHg/L
DLCO cor % pred: 73 %
DLCO cor: 20.24 ml/min/mmHg
DLCO unc % pred: 73 %
DLCO unc: 20.01 ml/min/mmHg
FEF 25-75 Post: 2 L/sec
FEF 25-75 Pre: 1.84 L/sec
FEF2575-%Change-Post: 8 %
FEF2575-%Pred-Post: 76 %
FEF2575-%Pred-Pre: 70 %
FEV1-%Change-Post: 4 %
FEV1-%Pred-Post: 84 %
FEV1-%Pred-Pre: 81 %
FEV1-Post: 2.95 L
FEV1-Pre: 2.82 L
FEV1FVC-%Change-Post: 3 %
FEV1FVC-%Pred-Pre: 96 %
FEV6-%Change-Post: 2 %
FEV6-%Pred-Post: 89 %
FEV6-%Pred-Pre: 87 %
FEV6-Post: 4 L
FEV6-Pre: 3.9 L
FEV6FVC-%Change-Post: 1 %
FEV6FVC-%Pred-Post: 105 %
FEV6FVC-%Pred-Pre: 103 %
FVC-%Change-Post: 1 %
FVC-%Pred-Post: 85 %
FVC-%Pred-Pre: 84 %
FVC-Post: 4.03 L
FVC-Pre: 3.98 L
Post FEV1/FVC ratio: 73 %
Post FEV6/FVC ratio: 99 %
Pre FEV1/FVC ratio: 71 %
Pre FEV6/FVC Ratio: 98 %
RV % pred: 83 %
RV: 2.14 L
TLC % pred: 81 %
TLC: 6.09 L

## 2020-11-30 LAB — ANA,IFA RA DIAG PNL W/RFLX TIT/PATN
Anti Nuclear Antibody (ANA): NEGATIVE
Cyclic Citrullin Peptide Ab: <16 UNITS
Rheumatoid fact SerPl-aCnc: <14 IU/mL (ref <14)

## 2020-11-30 LAB — ANCA SCREEN W REFLEX TITER
ANCA Screen: POSITIVE — AB
Atypical P-ANCA titer: 1:80 {titer} — ABNORMAL HIGH

## 2020-11-30 LAB — ANTI-SCLERODERMA ANTIBODY: Scleroderma (Scl-70) (ENA) Antibody, IgG: <1 AI

## 2020-11-30 LAB — SJOGREN'S SYNDROME ANTIBODS(SSA + SSB)
SSA (Ro) (ENA) Antibody, IgG: <1 AI
SSB (La) (ENA) Antibody, IgG: <1 AI

## 2020-12-01 LAB — HYPERSENSITIVITY PNEUMONITIS
A. Pullulans Abs: NEGATIVE
A.Fumigatus #1 Abs: NEGATIVE
Micropolyspora faeni, IgG: NEGATIVE
Pigeon Serum Abs: NEGATIVE
Thermoact. Saccharii: NEGATIVE
Thermoactinomyces vulgaris, IgG: NEGATIVE

## 2020-12-09 LAB — MYOMARKER 3 PLUS PROFILE (RDL)

## 2020-12-15 DIAGNOSIS — M48062 Spinal stenosis, lumbar region with neurogenic claudication: Secondary | ICD-10-CM | POA: Diagnosis not present

## 2020-12-15 DIAGNOSIS — M4716 Other spondylosis with myelopathy, lumbar region: Secondary | ICD-10-CM | POA: Diagnosis not present

## 2020-12-15 DIAGNOSIS — G894 Chronic pain syndrome: Secondary | ICD-10-CM | POA: Diagnosis not present

## 2020-12-18 ENCOUNTER — Ambulatory Visit
Admission: RE | Admit: 2020-12-18 | Discharge: 2020-12-18 | Disposition: A | Discharge status: Home / Self Care | Payer: Medicare Other | Source: Ambulatory Visit | Attending: Pulmonary Disease | Admitting: Pulmonary Disease

## 2020-12-18 ENCOUNTER — Other Ambulatory Visit: Payer: Self-pay

## 2020-12-18 DIAGNOSIS — J8410 Pulmonary fibrosis, unspecified: Secondary | ICD-10-CM

## 2020-12-18 DIAGNOSIS — R911 Solitary pulmonary nodule: Secondary | ICD-10-CM | POA: Diagnosis not present

## 2020-12-18 DIAGNOSIS — I700 Atherosclerosis of aorta: Secondary | ICD-10-CM | POA: Diagnosis not present

## 2020-12-18 DIAGNOSIS — R0602 Shortness of breath: Secondary | ICD-10-CM | POA: Diagnosis not present

## 2020-12-21 ENCOUNTER — Telehealth: Payer: Self-pay | Admitting: Pulmonary Disease

## 2020-12-21 DIAGNOSIS — R911 Solitary pulmonary nodule: Secondary | ICD-10-CM

## 2020-12-21 NOTE — Telephone Encounter (Signed)
I called [facility] Radiology back and per Maudie Mercury she is aware that we have the CT results and nothing further is needed.

## 2020-12-22 NOTE — Progress Notes (Signed)
Pt aware of results, Super D scan images were requested and PET scan ordered.

## 2020-12-23 DIAGNOSIS — G4700 Insomnia, unspecified: Secondary | ICD-10-CM | POA: Diagnosis not present

## 2020-12-23 DIAGNOSIS — F418 Other specified anxiety disorders: Secondary | ICD-10-CM | POA: Diagnosis not present

## 2020-12-24 ENCOUNTER — Telehealth: Payer: Self-pay | Admitting: Pulmonary Disease

## 2020-12-24 NOTE — Telephone Encounter (Signed)
Pt is aware of his appt

## 2020-12-29 DIAGNOSIS — F411 Generalized anxiety disorder: Secondary | ICD-10-CM | POA: Diagnosis not present

## 2020-12-29 DIAGNOSIS — F331 Major depressive disorder, recurrent, moderate: Secondary | ICD-10-CM | POA: Diagnosis not present

## 2021-01-01 ENCOUNTER — Other Ambulatory Visit: Payer: Self-pay | Admitting: Physician Assistant

## 2021-01-04 DIAGNOSIS — I2510 Atherosclerotic heart disease of native coronary artery without angina pectoris: Secondary | ICD-10-CM | POA: Diagnosis not present

## 2021-01-04 DIAGNOSIS — F322 Major depressive disorder, single episode, severe without psychotic features: Secondary | ICD-10-CM | POA: Diagnosis not present

## 2021-01-04 DIAGNOSIS — I10 Essential (primary) hypertension: Secondary | ICD-10-CM | POA: Diagnosis not present

## 2021-01-04 DIAGNOSIS — E7800 Pure hypercholesterolemia, unspecified: Secondary | ICD-10-CM | POA: Diagnosis not present

## 2021-01-04 DIAGNOSIS — G4700 Insomnia, unspecified: Secondary | ICD-10-CM | POA: Diagnosis not present

## 2021-01-04 DIAGNOSIS — K219 Gastro-esophageal reflux disease without esophagitis: Secondary | ICD-10-CM | POA: Diagnosis not present

## 2021-01-04 DIAGNOSIS — G8929 Other chronic pain: Secondary | ICD-10-CM | POA: Diagnosis not present

## 2021-01-04 DIAGNOSIS — E782 Mixed hyperlipidemia: Secondary | ICD-10-CM | POA: Diagnosis not present

## 2021-01-05 ENCOUNTER — Other Ambulatory Visit: Payer: Self-pay

## 2021-01-05 ENCOUNTER — Other Ambulatory Visit (HOSPITAL_COMMUNITY): Payer: Self-pay | Admitting: Physician Assistant

## 2021-01-05 ENCOUNTER — Encounter (HOSPITAL_COMMUNITY)
Admission: RE | Admit: 2021-01-05 | Discharge: 2021-01-05 | Disposition: A | Discharge status: Home / Self Care | Payer: Medicare Other | Source: Ambulatory Visit | Attending: Pulmonary Disease | Admitting: Pulmonary Disease

## 2021-01-05 DIAGNOSIS — R911 Solitary pulmonary nodule: Secondary | ICD-10-CM | POA: Insufficient documentation

## 2021-01-05 LAB — GLUCOSE, CAPILLARY: Glucose-Capillary: 89 mg/dL (ref 70–99)

## 2021-01-05 MED ORDER — FLUDEOXYGLUCOSE F - 18 (FDG) INJECTION
10.8800 | Freq: Once | INTRAVENOUS | Status: AC | PRN
  Administered 2021-01-05: 10.88 via INTRAVENOUS

## 2021-01-12 DIAGNOSIS — M48062 Spinal stenosis, lumbar region with neurogenic claudication: Secondary | ICD-10-CM | POA: Diagnosis not present

## 2021-01-12 DIAGNOSIS — G894 Chronic pain syndrome: Secondary | ICD-10-CM | POA: Diagnosis not present

## 2021-01-12 DIAGNOSIS — Z79891 Long term (current) use of opiate analgesic: Secondary | ICD-10-CM | POA: Diagnosis not present

## 2021-01-12 DIAGNOSIS — Z79899 Other long term (current) drug therapy: Secondary | ICD-10-CM | POA: Diagnosis not present

## 2021-01-12 DIAGNOSIS — M4716 Other spondylosis with myelopathy, lumbar region: Secondary | ICD-10-CM | POA: Diagnosis not present

## 2021-01-14 ENCOUNTER — Encounter: Payer: Self-pay | Admitting: Pulmonary Disease

## 2021-01-14 ENCOUNTER — Other Ambulatory Visit: Payer: Self-pay | Admitting: *Deleted

## 2021-01-14 ENCOUNTER — Encounter: Payer: Self-pay | Admitting: *Deleted

## 2021-01-14 DIAGNOSIS — R911 Solitary pulmonary nodule: Secondary | ICD-10-CM

## 2021-01-14 NOTE — Progress Notes (Signed)
Patient's case was discussed at multidisciplinary tumor board Recommendation made to refer to surgery for wedge resection and lung biopsy  I called and discussed these recommendations with the patient and he has agreed to a referral  Lattie Haw- Please make the referral to Cardiothoracic surgery and make a follow up appointment with me the week of 28th Nov  Marshell Garfinkel MD [facility] 01/14/2021, 11:06 AM

## 2021-01-14 NOTE — Progress Notes (Signed)
Patient to scheduled follow up visit with Dr. Vaughan Browner the week of November 28,2022. Called and spoke with Patient.  Patient scheduled 01/29/21 at 1030. Cardiothoracic surgery order has been placed. Nothing further at this time.

## 2021-01-14 NOTE — Progress Notes (Signed)
The proposed treatment discussed in conference is for discussion purpose only and is not a binding recommendation. The patient was not been physically examined, or presented with their treatment options. Therefore, final treatment plans cannot be decided.  

## 2021-01-14 NOTE — Telephone Encounter (Signed)
-----   Message from Valrie Hart, RN sent at 01/14/2021  9:33 AM EST ----- Hi Dr. Vaughan Browner- I wanted to update you on thoracic cancer conference discussion. Recommendations are to refer to thoracic surgery for possible wedge and bx for ILD.  Let me know if you have any questions, thanks Hinton Dyer ----- Message ----- From: Garner Nash, DO Sent: 01/08/2021  10:31 AM EST To: Valrie Hart, RN, Collene Gobble, MD, #  Sounds good  BLI   ----- Message ----- From: Marshell Garfinkel, MD Sent: 01/07/2021   1:28 PM EST To: Valrie Hart, RN, Collene Gobble, MD, #  Can you review scan and discuss at [facility] He can tolerate a resection but there may be some hilar updake so nav and EBUS first?  Thanks   ----- Message ----- From: Interface, Rad Results In Sent: 01/06/2021  12:24 PM EST To: Marshell Garfinkel, MD

## 2021-01-26 DIAGNOSIS — F411 Generalized anxiety disorder: Secondary | ICD-10-CM | POA: Diagnosis not present

## 2021-01-26 DIAGNOSIS — F331 Major depressive disorder, recurrent, moderate: Secondary | ICD-10-CM | POA: Diagnosis not present

## 2021-01-29 ENCOUNTER — Ambulatory Visit (INDEPENDENT_AMBULATORY_CARE_PROVIDER_SITE_OTHER): Payer: Medicare Other | Admitting: Pulmonary Disease

## 2021-01-29 ENCOUNTER — Other Ambulatory Visit: Payer: Self-pay

## 2021-01-29 ENCOUNTER — Encounter: Payer: Self-pay | Admitting: Pulmonary Disease

## 2021-01-29 VITALS — BP 122/66 | HR 60 | Temp 98.0°F | Ht 72.0 in | Wt 217.0 lb

## 2021-01-29 DIAGNOSIS — R911 Solitary pulmonary nodule: Secondary | ICD-10-CM

## 2021-01-29 DIAGNOSIS — J849 Interstitial pulmonary disease, unspecified: Secondary | ICD-10-CM

## 2021-01-29 DIAGNOSIS — I200 Unstable angina: Secondary | ICD-10-CM | POA: Diagnosis not present

## 2021-01-29 NOTE — Patient Instructions (Signed)
I have discussed the CT and PET imaging findings in detail with you and plan Follow-up with cardiothoracic surgery I will see back in clinic in 1 month

## 2021-01-29 NOTE — Progress Notes (Signed)
Richard Parrish    [id number]    Dec 11, 1950  Primary Care Physician:Polite, Jori Moll, MD  Referring Physician: Seward Carol, MD [address] [place],  [place] [address]  Chief complaint: Follow up for interstitial lung disease  HPI: 70 year old with history of pulmonary fibrosis, GERD, hypertension, hyperlipidemia, pulmonary fibrosis He was diagnosed with unspecified pulmonary fibrosis and was seen in pulmonary clinic in 2015  Developed COVID-19 in March 2021 and reports worsening dyspnea since then.  Complains of chronic dyspnea on exertion, cough.  No fevers or chills  He has chronic back pain for which he is on multiple pain medications, severe depression as a result of this.  He had to give up golfing which he loves due to back issues.  Pets: 3 cats Occupation: Used to work in Librarian, academic at [employer] Exposures: Exposed to [employer] carrier. Smoking history: 30-pack-year smoker.  Quit in 1989.  Currently vapes which helps with his back. Travel history: No significant recent travel history Relevant family history: No family history of lung disease  Interim history: He is here for follow-up of CT and PET scan which showed right upper lobe lung nodule with FDG uptake Case discussed at multidisciplinary tumor board on 11/17 with recommendations for evaluation by surgery  Overall he feels well with chronic cough.  No new issues  Outpatient Encounter Medications as of 01/29/2021  Medication Sig   acetaminophen (TYLENOL) 500 MG tablet Take 500 mg by mouth every 6 (six) hours as needed.   atorvastatin (LIPITOR) 80 MG tablet TAKE 1 TABLET BY MOUTH EVERY DAY   budesonide (ENTOCORT EC) 3 MG 24 hr capsule Take 3-6 mg by mouth See admin instructions. Takes 2 tablets in the morning and 1 tablet at night   cholecalciferol (VITAMIN D3) 25 MCG (1000 UNIT) tablet Take 1,000 Units by mouth daily.   clopidogrel (PLAVIX) 75 MG tablet TAKE 1 TABLET BY MOUTH  EVERY DAY   Glucosamine HCl-MSM (GLUCOSAMINE-MSM PO) Take 1 tablet by mouth daily.   HYDROcodone-acetaminophen (NORCO) 7.5-325 MG tablet Take 1 tablet by mouth every 4 (four) hours as needed.   hyoscyamine (LEVSIN, ANASPAZ) 0.125 MG tablet Take 0.125 mg by mouth every 4 (four) hours as needed for cramping.    isosorbide mononitrate (IMDUR) 30 MG 24 hr tablet Take 1 tablet (30 mg total) by mouth daily.   metoprolol succinate (TOPROL-XL) 50 MG 24 hr tablet TAKE 1 TABLET BY MOUTH EVERY DAY AFTER A MEAL   nitroGLYCERIN (NITROSTAT) 0.4 MG SL tablet Place 0.4 mg under the tongue every 5 (five) minutes as needed for chest pain.   pantoprazole (PROTONIX) 40 MG tablet TAKE 1 TABLET BY MOUTH DAILY BEFORE BREAKFAST   PARoxetine (PAXIL) 40 MG tablet Take 40 mg by mouth daily.   Probiotic Product (PROBIOTIC PO) Take 1 capsule by mouth daily.   tamsulosin (FLOMAX) 0.4 MG CAPS capsule Take 1 capsule by mouth 2 (two) times daily.   valsartan (DIOVAN) 320 MG tablet Take 1 tablet (320 mg total) by mouth daily. (Patient taking differently: Take 160 mg by mouth daily.)   ALPRAZolam (XANAX) 0.25 MG tablet Take 0.25 mg by mouth 2 (two) times daily as needed for anxiety.   metaxalone (SKELAXIN) 800 MG tablet Take 800 mg by mouth 3 (three) times daily as needed for muscle spasms.   No facility-administered encounter medications on file as of 01/29/2021.    Physical Exam: Blood pressure 122/66, pulse 60, temperature 98 F (  36.7 C), temperature source Oral, height 6' (1.829 m), weight 217 lb (98.4 kg), SpO2 99 %. Gen:      No acute distress HEENT:  EOMI, sclera anicteric Neck:     No masses; no thyromegaly Lungs:    Bibasal crackles CV:         Regular rate and rhythm; no murmurs Abd:      + bowel sounds; soft, non-tender; no palpable masses, no distension Ext:    No edema; adequate peripheral perfusion Skin:      Warm and dry; no rash Neuro: alert and oriented x 3 Psych: normal mood and affect   Data  Reviewed: Imaging: CT chest 07/18/2013-interstitial fibrosis with traction bronchiectasis with basilar predominance, emphysema.  Probable UIP pattern CT abdomen pelvis 02/07/17- basal pulmonary fibrosis. CT high-res 12/18/2020-new spiculated lung nodule in the right upper lobe PET scan 01/05/2021-uptake in the right upper lobe, equivocal uptake in the right hilum.  Left sphenoid sinus disease. I have reviewed the images personally  PFTs: 11/27/2020 FVC 4.03 [85%], FEV1 2.95 [84%], F/F 73, TLC 6.09 [81%], DLCO 20.01 [73%] Minimal diffusion defect.  Labs: CTD serologies 11/26/2020-P ANCA 1:80  Assessment:  Right upper lobe nodule Suspicious for primary lung cancer Discussed at multidisciplinary tumor board and recommendation made for surgical evaluation.  May need to combined bronchoscopy and surgery during single anesthesia event He has an appointment with Dr. 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  Has slight progression on repeat CT scan Elevation in ANCA is likely nonspecific as he has no signs of connective tissue disease.  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

## 2021-02-02 ENCOUNTER — Telehealth: Payer: Self-pay | Admitting: Pulmonary Disease

## 2021-02-02 NOTE — Telephone Encounter (Signed)
Called and spoke with Patient's wife Zada Girt (DPR).  Zada Girt stated at [facility] Dr. Vaughan Browner had mentioned trying to get Patient's OV with Dr. Kipp Brood moved up. Patient is currently scheduled 02/12/21 with Dr. Kipp Brood.     Message routed to Dr. Vaughan Browner to advise

## 2021-02-08 DIAGNOSIS — M48062 Spinal stenosis, lumbar region with neurogenic claudication: Secondary | ICD-10-CM | POA: Diagnosis not present

## 2021-02-08 DIAGNOSIS — G894 Chronic pain syndrome: Secondary | ICD-10-CM | POA: Diagnosis not present

## 2021-02-08 DIAGNOSIS — M4716 Other spondylosis with myelopathy, lumbar region: Secondary | ICD-10-CM | POA: Diagnosis not present

## 2021-02-10 ENCOUNTER — Other Ambulatory Visit: Payer: Self-pay

## 2021-02-10 ENCOUNTER — Ambulatory Visit (INDEPENDENT_AMBULATORY_CARE_PROVIDER_SITE_OTHER): Payer: Medicare Other | Admitting: Thoracic Surgery (Cardiothoracic Vascular Surgery)

## 2021-02-10 VITALS — BP 111/71 | HR 70 | Resp 20 | Ht 72.0 in | Wt 217.0 lb

## 2021-02-10 DIAGNOSIS — R911 Solitary pulmonary nodule: Secondary | ICD-10-CM

## 2021-02-10 NOTE — Progress Notes (Signed)
[address]       [place] [address]             [phone]                    Blade P Manganelli Bouse Medical Record #[id number] Date of Birth: [birth year]  Referring: Marshell Garfinkel, MD Primary Care: Seward Carol, MD Primary Cardiologist: Sinclair Grooms, MD  Chief Complaint:    Chief Complaint  Patient presents with   Lung Lesion    Surgical consult, PET Scan 01/05/21, Chest CT 12/18/20, PFT's 11/27/20    History of Present Illness:    NASIAH Parrish 70 y.o. male referred by Dr.Manna.m for surgical evaluation of a 2 cm right upper lobe pulmonary nodule.  He also has a history of pulmonary fibrosis that was diagnosed in 1997.  He states that that he was previously treated with high-dose steroids but this has been stable for several decades.  He recently noted some shortness of breath and underwent a cardiac evaluation which did not show any significant coronary disease.  Of note in 1989 he did have to undergo open heart surgery for failed PCI with perforation to his RCA.  On most recent left heart cath from this year there was no significant disease.  He does remain active and occasionally endorses some exertional shortness of breath.  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      Smoking Hx: 1989   Zubrod Score: At the time of surgery this patients most appropriate activity status/level should be described as: []     0    Normal activity, no symptoms []     1    Restricted in physical strenuous activity but ambulatory, able to do out light work []     2    Ambulatory and capable of self care, unable to do work activities, up and about               >50 % of waking hours                              []     3    Only limited self care, in bed greater than 50% of waking hours []     4    Completely disabled, no self care, confined to bed or chair []     5    Moribund   Past Medical  History:  Diagnosis Date   Anxiety    Arthritis    "lower back" (04/07/2017)   BPH (benign prostatic hypertrophy)    C. difficile diarrhea    CAD (coronary artery disease)    Chronic lower back pain    Coronary artery disease 1989   cabg with dvg to rca    Depression    GERD (gastroesophageal reflux disease)    Hyperlipidemia    Hypertension    Insomnia    Leg pain    Myocardial infarction ([place]) 2002   OSA on CPAP    cpap setting of 60 per pt   Osteoarthritis    "hips" (04/07/2017)   PAD (peripheral artery disease) ([place])    Peripheral vascular disease ([place])    Pneumonia 1989   S/P CABG   Pulmonary fibrosis ([place])    "one time" (04/07/2017)   Spinal stenosis  Past Surgical History:  Procedure Laterality Date   ABDOMINAL AORTAGRAM N/A 03/15/2011   Procedure: ABDOMINAL AORTAGRAM;  Surgeon: Serafina Mitchell, MD;  Location: [facility] CATH LAB;  Service: Cardiovascular;  Laterality: N/A;   ABDOMINAL AORTAGRAM N/A 04/12/2011   Procedure: ABDOMINAL Maxcine Ham;  Surgeon: Serafina Mitchell, MD;  Location: [facility] CATH LAB;  Service: Cardiovascular;  Laterality: N/A;   BACK SURGERY     CARDIAC CATHETERIZATION  08/1987   CORONARY ANGIOPLASTY WITH STENT PLACEMENT  ~ 2002   [facility], [place]   CORONARY ARTERY BYPASS GRAFT  09/1987   "CABG X1";  [facility]; [place]; "RCA"   JOINT REPLACEMENT     KNEE ARTHROSCOPY Left    LAPAROSCOPIC CHOLECYSTECTOMY     PERIPHERAL VASCULAR INTERVENTION Left 2016   "put a stent; right branch of left femoral"   POSTERIOR LUMBAR FUSION  2009   lumbar fusion, S L 5 to L 4   RIGHT/LEFT HEART CATH AND CORONARY/GRAFT ANGIOGRAPHY N/A 10/05/2020   Procedure: RIGHT/LEFT HEART CATH AND CORONARY/GRAFT ANGIOGRAPHY;  Surgeon: Nelva Bush, MD;  Location: [facility] CV LAB;  Service: Cardiovascular;  Laterality: N/A;   SHOULDER ARTHROSCOPY W/ ROTATOR CUFF REPAIR Right 2006   SHOULDER ARTHROSCOPY WITH SUBACROMIAL DECOMPRESSION AND OPEN ROTATOR C Left ~ [place]     TOTAL HIP ARTHROPLASTY Right 2012   TOTAL HIP ARTHROPLASTY Left 04/07/2017   TOTAL HIP ARTHROPLASTY Left 04/07/2017   Procedure: LEFT TOTAL HIP ARTHROPLASTY;  Surgeon: Earlie Server, MD;  Location: [facility];  Service: Orthopedics;  Laterality: Left;    Family History  Problem Relation Age of Onset   Heart disease Mother    Cancer Father      Social History   Tobacco Use  Smoking Status Former   Packs/day: 2.00   Years: 15.00   Pack years: 30.00   Types: Cigarettes   Quit date: 03/01/1987   Years since quitting: 33.9  Smokeless Tobacco Never    Social History   Substance and Sexual Activity  Alcohol Use Yes   Comment: 04/07/2017 'couple drinks/month"     Allergies  Allergen Reactions   Ambien [Zolpidem Tartrate] Other (See Comments)    Bad dreams/Vivid Dreams    Current Outpatient Medications  Medication Sig Dispense Refill   acetaminophen (TYLENOL) 500 MG tablet Take 500 mg by mouth every 6 (six) hours as needed.     atorvastatin (LIPITOR) 80 MG tablet TAKE 1 TABLET BY MOUTH EVERY DAY 90 tablet 1   budesonide (ENTOCORT EC) 3 MG 24 hr capsule Take 3-6 mg by mouth See admin instructions. Takes 2 tablets in the morning and 1 tablet at night     cholecalciferol (VITAMIN D3) 25 MCG (1000 UNIT) tablet Take 1,000 Units by mouth daily.     clopidogrel (PLAVIX) 75 MG tablet TAKE 1 TABLET BY MOUTH EVERY DAY 30 tablet 11   Glucosamine HCl-MSM (GLUCOSAMINE-MSM PO) Take 1 tablet by mouth daily.     HYDROcodone-acetaminophen (NORCO) 7.5-325 MG tablet Take 1 tablet by mouth every 4 (four) hours as needed.     hyoscyamine (LEVSIN, ANASPAZ) 0.125 MG tablet Take 0.125 mg by mouth every 4 (four) hours as needed for cramping.      isosorbide mononitrate (IMDUR) 30 MG 24 hr tablet Take 1 tablet (30 mg total) by mouth daily. 30 tablet 11   metoprolol succinate (TOPROL-XL) 50 MG 24 hr tablet TAKE 1 TABLET BY MOUTH EVERY DAY AFTER A MEAL 90 tablet 1  nitroGLYCERIN (NITROSTAT) 0.4 MG SL tablet Place 0.4 mg under the tongue every 5 (five) minutes as needed for chest pain.     pantoprazole (PROTONIX) 40 MG tablet TAKE 1 TABLET BY MOUTH DAILY BEFORE BREAKFAST 90 tablet 1   PARoxetine (PAXIL) 40 MG tablet Take 40 mg by mouth daily.  6   Probiotic Product (PROBIOTIC PO) Take 1 capsule by mouth daily.     tamsulosin (FLOMAX) 0.4 MG CAPS capsule Take 1 capsule by mouth 2 (two) times daily.  5   valsartan (DIOVAN) 320 MG tablet Take 1 tablet (320 mg total) by mouth daily. (Patient taking differently: Take 160 mg by mouth daily.) 90 tablet 3   ALPRAZolam (XANAX) 0.25 MG tablet Take 0.25 mg by mouth 2 (two) times daily as needed for anxiety.     metaxalone (SKELAXIN) 800 MG tablet Take 800 mg by mouth 3 (three) times daily as needed for muscle spasms.     No current facility-administered medications for this visit.    Review of Systems  Constitutional:  Negative for malaise/fatigue.  Respiratory:  Positive for shortness of breath. Negative for cough.   Cardiovascular:  Negative for chest pain.  Musculoskeletal:  Positive for back pain and myalgias.  Neurological: Negative.     PHYSICAL EXAMINATION: BP 111/71 (BP Location: Left Arm, Patient Position: Sitting)    Pulse 70    Resp 20    Ht 6' (1.829 m)    Wt 217 lb (98.4 kg)    SpO2 92% Comment: RA   BMI 29.43 kg/m  Physical Exam Constitutional:      General: He is not in acute distress.    Appearance: Normal appearance. He is not ill-appearing.  HENT:     Head: Normocephalic and atraumatic.  Eyes:     Extraocular Movements: Extraocular movements intact.  Cardiovascular:     Rate and Rhythm: Normal rate and regular rhythm.     Heart sounds: No murmur heard. Pulmonary:     Effort: Pulmonary effort is normal. No respiratory distress.     Breath sounds: Normal breath sounds.  Abdominal:     General: Abdomen is flat. There is no distension.  Musculoskeletal:        General: Normal range of  motion.  Skin:    General: Skin is warm and dry.  Neurological:     General: No focal deficit present.     Mental Status: He is alert and oriented to person, place, and time.    Diagnostic Studies & Laboratory data:     Recent Radiology Findings:   No results found.     I have independently reviewed the above radiology studies  and reviewed the findings with the patient.   Recent Lab Findings: Lab Results  Component Value Date   WBC 8.5 10/05/2020   HGB 12.6 (L) 10/05/2020   HCT 37.0 (L) 10/05/2020   PLT 274 10/05/2020   GLUCOSE 155 (H) 10/04/2020   CHOL 97 10/04/2020   TRIG 46 10/04/2020   HDL 35 (L) 10/04/2020   LDLCALC 53 10/04/2020   ALT 21 10/03/2020   AST 25 10/03/2020   NA 141 10/05/2020   K 3.9 10/05/2020   CL 103 10/04/2020   CREATININE 1.03 10/04/2020   BUN 17 10/04/2020   CO2 23 10/04/2020   INR 1.0 10/03/2020   HGBA1C 5.3 10/03/2020     PFTs:  - FVC: 84% - FEV1: 81% -DLCO: 73%  Problem List: Right upper lobe 2 cm pulmonary nodule with  SUV uptake of 4.29. Interstitial lung disease History of CABG.      Assessment / Plan:   70 year old male with history of pulmonary fibrosis male with a 2 cm right upper lobe pulmonary nodule.  His pulmonary function testings are acceptable for lobectomy.  He does have a history of interstitial lung disease which appears to be stable.  I would like to obtain new pulmonary function testing since his most recent one was 40 months old.  He states that his respiratory status has not changed much since that time.  I will also refer him to Dr. Valeta Harms for evaluation of a navigational bronchoscopy in combination with our lung resection.  He will need to be on a Plavix washout prior to this procedure.  We will plan for combination procedure with biopsy of the nodule via navigational bronchoscopy, and if this is positive for primary lung cancer then he will undergo a right upper lobectomy.  Dr. Vaughan Browner is also requested that we  perform wedge resections of the middle and lower lobe for further delineation of his pulmonary fibrosis.    Although his pulmonary function tests are good, I did explain possibility of having an exacerbation of his pulmonary fibrosis with manipulation of the lung.  Also on review of his imaging his upper lobe is the segment that is least scarred thus despite having normal pulmonary function testing there is a possibility that he may require supplemental oxygen if a lobectomy is required.  Both he and his wife are understanding of this and are in agreement with proceeding.  I  spent 40 minutes with  the patient face to face in counseling and coordination of care.    Lajuana Matte 02/10/2021 3:54 PM

## 2021-02-10 NOTE — H&P (View-Only) (Signed)
[address]       [name] [address]             [id number]                    Richard Parrish  Medical Record #[id number] Date of Birth: 11-Dec-1950  Referring: Marshell Garfinkel, MD Primary Care: Seward Carol, MD Primary Cardiologist: Sinclair Grooms, MD  Chief Complaint:    Chief Complaint  Patient presents with   Lung Lesion    Surgical consult, PET Scan 01/05/21, Chest CT 12/18/20, PFT's 11/27/20    History of Present Illness:    Richard Parrish 70 y.o. male referred by Dr.Manna.m for surgical evaluation of a 2 cm right upper lobe pulmonary nodule.  He also has a history of pulmonary fibrosis that was diagnosed in 1997.  He states that that he was previously treated with high-dose steroids but this has been stable for several decades.  He recently noted some shortness of breath and underwent a cardiac evaluation which did not show any significant coronary disease.  Of note in 1989 he did have to undergo open heart surgery for failed PCI with perforation to his RCA.  On most recent left heart cath from this year there was no significant disease.  He does remain active and occasionally endorses some exertional shortness of breath.  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      Smoking Hx: 1989   Zubrod Score: At the time of surgery this patients most appropriate activity status/level should be described as: []     0    Normal activity, no symptoms []     1    Restricted in physical strenuous activity but ambulatory, able to do out light work []     2    Ambulatory and capable of self care, unable to do work activities, up and about               >50 % of waking hours                              []     3    Only limited self care, in bed greater than 50% of waking hours []     4    Completely disabled, no self care, confined to bed or chair []     5    Moribund   Past Medical  History:  Diagnosis Date   Anxiety    Arthritis    "lower back" (04/07/2017)   BPH (benign prostatic hypertrophy)    C. difficile diarrhea    CAD (coronary artery disease)    Chronic lower back pain    Coronary artery disease 1989   cabg with dvg to rca    Depression    GERD (gastroesophageal reflux disease)    Hyperlipidemia    Hypertension    Insomnia    Leg pain    Myocardial infarction ([place]) 2002   OSA on CPAP    cpap setting of 60 per pt   Osteoarthritis    "hips" (04/07/2017)   PAD (peripheral artery disease) ([place])    Peripheral vascular disease ([place])    Pneumonia 1989   S/P CABG   Pulmonary fibrosis ([place])    "one time" (04/07/2017)   Spinal stenosis  Past Surgical History:  Procedure Laterality Date   ABDOMINAL AORTAGRAM N/A 03/15/2011   Procedure: ABDOMINAL AORTAGRAM;  Surgeon: Serafina Mitchell, MD;  Location: [facility] CATH LAB;  Service: Cardiovascular;  Laterality: N/A;   ABDOMINAL AORTAGRAM N/A 04/12/2011   Procedure: ABDOMINAL Maxcine Ham;  Surgeon: Serafina Mitchell, MD;  Location: [facility] CATH LAB;  Service: Cardiovascular;  Laterality: N/A;   BACK SURGERY     CARDIAC CATHETERIZATION  08/1987   CORONARY ANGIOPLASTY WITH STENT PLACEMENT  ~ 2002   [facility], [place]   CORONARY ARTERY BYPASS GRAFT  09/1987   "CABG X1";  [facility]; [place]; "RCA"   JOINT REPLACEMENT     KNEE ARTHROSCOPY Left    LAPAROSCOPIC CHOLECYSTECTOMY     PERIPHERAL VASCULAR INTERVENTION Left 2016   "put a stent; right branch of left femoral"   POSTERIOR LUMBAR FUSION  2009   lumbar fusion, S L 5 to L 4   RIGHT/LEFT HEART CATH AND CORONARY/GRAFT ANGIOGRAPHY N/A 10/05/2020   Procedure: RIGHT/LEFT HEART CATH AND CORONARY/GRAFT ANGIOGRAPHY;  Surgeon: Nelva Bush, MD;  Location: [place] CV LAB;  Service: Cardiovascular;  Laterality: N/A;   SHOULDER ARTHROSCOPY W/ ROTATOR CUFF REPAIR Right 2006   SHOULDER ARTHROSCOPY WITH SUBACROMIAL DECOMPRESSION AND OPEN ROTATOR C Left ~ [place]     TOTAL HIP ARTHROPLASTY Right 2012   TOTAL HIP ARTHROPLASTY Left 04/07/2017   TOTAL HIP ARTHROPLASTY Left 04/07/2017   Procedure: LEFT TOTAL HIP ARTHROPLASTY;  Surgeon: Earlie Server, MD;  Location: [place];  Service: Orthopedics;  Laterality: Left;    Family History  Problem Relation Age of Onset   Heart disease Mother    Cancer Father      Social History   Tobacco Use  Smoking Status Former   Packs/day: 2.00   Years: 15.00   Pack years: 30.00   Types: Cigarettes   Quit date: 03/01/1987   Years since quitting: 33.9  Smokeless Tobacco Never    Social History   Substance and Sexual Activity  Alcohol Use Yes   Comment: 04/07/2017 'couple drinks/month"     Allergies  Allergen Reactions   Ambien [Zolpidem Tartrate] Other (See Comments)    Bad dreams/Vivid Dreams    Current Outpatient Medications  Medication Sig Dispense Refill   acetaminophen (TYLENOL) 500 MG tablet Take 500 mg by mouth every 6 (six) hours as needed.     atorvastatin (LIPITOR) 80 MG tablet TAKE 1 TABLET BY MOUTH EVERY DAY 90 tablet 1   budesonide (ENTOCORT EC) 3 MG 24 hr capsule Take 3-6 mg by mouth See admin instructions. Takes 2 tablets in the morning and 1 tablet at night     cholecalciferol (VITAMIN D3) 25 MCG (1000 UNIT) tablet Take 1,000 Units by mouth daily.     clopidogrel (PLAVIX) 75 MG tablet TAKE 1 TABLET BY MOUTH EVERY DAY 30 tablet 11   Glucosamine HCl-MSM (GLUCOSAMINE-MSM PO) Take 1 tablet by mouth daily.     HYDROcodone-acetaminophen (NORCO) 7.5-325 MG tablet Take 1 tablet by mouth every 4 (four) hours as needed.     hyoscyamine (LEVSIN, ANASPAZ) 0.125 MG tablet Take 0.125 mg by mouth every 4 (four) hours as needed for cramping.      isosorbide mononitrate (IMDUR) 30 MG 24 hr tablet Take 1 tablet (30 mg total) by mouth daily. 30 tablet 11   metoprolol succinate (TOPROL-XL) 50 MG 24 hr tablet TAKE 1 TABLET BY MOUTH EVERY DAY AFTER A MEAL 90 tablet 1  nitroGLYCERIN (NITROSTAT) 0.4 MG SL tablet Place 0.4 mg under the tongue every 5 (five) minutes as needed for chest pain.     pantoprazole (PROTONIX) 40 MG tablet TAKE 1 TABLET BY MOUTH DAILY BEFORE BREAKFAST 90 tablet 1   PARoxetine (PAXIL) 40 MG tablet Take 40 mg by mouth daily.  6   Probiotic Product (PROBIOTIC PO) Take 1 capsule by mouth daily.     tamsulosin (FLOMAX) 0.4 MG CAPS capsule Take 1 capsule by mouth 2 (two) times daily.  5   valsartan (DIOVAN) 320 MG tablet Take 1 tablet (320 mg total) by mouth daily. (Patient taking differently: Take 160 mg by mouth daily.) 90 tablet 3   ALPRAZolam (XANAX) 0.25 MG tablet Take 0.25 mg by mouth 2 (two) times daily as needed for anxiety.     metaxalone (SKELAXIN) 800 MG tablet Take 800 mg by mouth 3 (three) times daily as needed for muscle spasms.     No current facility-administered medications for this visit.    Review of Systems  Constitutional:  Negative for malaise/fatigue.  Respiratory:  Positive for shortness of breath. Negative for cough.   Cardiovascular:  Negative for chest pain.  Musculoskeletal:  Positive for back pain and myalgias.  Neurological: Negative.     PHYSICAL EXAMINATION: BP 111/71 (BP Location: Left Arm, Patient Position: Sitting)    Pulse 70    Resp 20    Ht 6' (1.829 m)    Wt 217 lb (98.4 kg)    SpO2 92% Comment: RA   BMI 29.43 kg/m  Physical Exam Constitutional:      General: He is not in acute distress.    Appearance: Normal appearance. He is not ill-appearing.  HENT:     Head: Normocephalic and atraumatic.  Eyes:     Extraocular Movements: Extraocular movements intact.  Cardiovascular:     Rate and Rhythm: Normal rate and regular rhythm.     Heart sounds: No murmur heard. Pulmonary:     Effort: Pulmonary effort is normal. No respiratory distress.     Breath sounds: Normal breath sounds.  Abdominal:     General: Abdomen is flat. There is no distension.  Musculoskeletal:        General: Normal range of  motion.  Skin:    General: Skin is warm and dry.  Neurological:     General: No focal deficit present.     Mental Status: He is alert and oriented to person, place, and time.    Diagnostic Studies & Laboratory data:     Recent Radiology Findings:   No results found.     I have independently reviewed the above radiology studies  and reviewed the findings with the patient.   Recent Lab Findings: Lab Results  Component Value Date   WBC 8.5 10/05/2020   HGB 12.6 (L) 10/05/2020   HCT 37.0 (L) 10/05/2020   PLT 274 10/05/2020   GLUCOSE 155 (H) 10/04/2020   CHOL 97 10/04/2020   TRIG 46 10/04/2020   HDL 35 (L) 10/04/2020   LDLCALC 53 10/04/2020   ALT 21 10/03/2020   AST 25 10/03/2020   NA 141 10/05/2020   K 3.9 10/05/2020   CL 103 10/04/2020   CREATININE 1.03 10/04/2020   BUN 17 10/04/2020   CO2 23 10/04/2020   INR 1.0 10/03/2020   HGBA1C 5.3 10/03/2020     PFTs:  - FVC: 84% - FEV1: 81% -DLCO: 73%  Problem List: Right upper lobe 2 cm pulmonary nodule with  SUV uptake of 4.29. Interstitial lung disease History of CABG.      Assessment / Plan:   70 year old male with history of pulmonary fibrosis male with a 2 cm right upper lobe pulmonary nodule.  His pulmonary function testings are acceptable for lobectomy.  He does have a history of interstitial lung disease which appears to be stable.  I would like to obtain new pulmonary function testing since his most recent one was 20 months old.  He states that his respiratory status has not changed much since that time.  I will also refer him to Dr. Valeta Harms for evaluation of a navigational bronchoscopy in combination with our lung resection.  He will need to be on a Plavix washout prior to this procedure.  We will plan for combination procedure with biopsy of the nodule via navigational bronchoscopy, and if this is positive for primary lung cancer then he will undergo a right upper lobectomy.  Dr. Vaughan Browner is also requested that we  perform wedge resections of the middle and lower lobe for further delineation of his pulmonary fibrosis.    Although his pulmonary function tests are good, I did explain possibility of having an exacerbation of his pulmonary fibrosis with manipulation of the lung.  Also on review of his imaging his upper lobe is the segment that is least scarred thus despite having normal pulmonary function testing there is a possibility that he may require supplemental oxygen if a lobectomy is required.  Both he and his wife are understanding of this and are in agreement with proceeding.  I  spent 40 minutes with  the patient face to face in counseling and coordination of care.    Lajuana Matte 02/10/2021 3:54 PM

## 2021-02-11 ENCOUNTER — Other Ambulatory Visit: Payer: Self-pay | Admitting: *Deleted

## 2021-02-11 ENCOUNTER — Telehealth: Payer: Self-pay | Admitting: Pulmonary Disease

## 2021-02-11 ENCOUNTER — Encounter: Payer: Self-pay | Admitting: *Deleted

## 2021-02-11 ENCOUNTER — Other Ambulatory Visit: Payer: Self-pay | Admitting: Interventional Cardiology

## 2021-02-11 ENCOUNTER — Ambulatory Visit (INDEPENDENT_AMBULATORY_CARE_PROVIDER_SITE_OTHER): Payer: Medicare Other | Admitting: Pulmonary Disease

## 2021-02-11 ENCOUNTER — Other Ambulatory Visit: Payer: Self-pay | Admitting: Pulmonary Disease

## 2021-02-11 DIAGNOSIS — J849 Interstitial pulmonary disease, unspecified: Secondary | ICD-10-CM

## 2021-02-11 DIAGNOSIS — Z5189 Encounter for other specified aftercare: Secondary | ICD-10-CM

## 2021-02-11 DIAGNOSIS — R911 Solitary pulmonary nodule: Secondary | ICD-10-CM

## 2021-02-11 DIAGNOSIS — Z5181 Encounter for therapeutic drug level monitoring: Secondary | ICD-10-CM

## 2021-02-11 LAB — PULMONARY FUNCTION TEST
DL/VA % pred: 77 %
DL/VA: 3.08 ml/min/mmHg/L
DLCO cor % pred: 64 %
DLCO cor: 17.65 ml/min/mmHg
DLCO unc % pred: 64 %
DLCO unc: 17.65 ml/min/mmHg
FEF 25-75 Post: 2.17 L/sec
FEF 25-75 Pre: 1.73 L/sec
FEF2575-%Change-Post: 25 %
FEF2575-%Pred-Post: 82 %
FEF2575-%Pred-Pre: 66 %
FEV1-%Change-Post: 5 %
FEV1-%Pred-Post: 82 %
FEV1-%Pred-Pre: 78 %
FEV1-Post: 2.88 L
FEV1-Pre: 2.72 L
FEV1FVC-%Change-Post: 3 %
FEV1FVC-%Pred-Pre: 96 %
FEV6-%Change-Post: 3 %
FEV6-%Pred-Post: 87 %
FEV6-%Pred-Pre: 84 %
FEV6-Post: 3.92 L
FEV6-Pre: 3.77 L
FEV6FVC-%Change-Post: 1 %
FEV6FVC-%Pred-Post: 105 %
FEV6FVC-%Pred-Pre: 104 %
FVC-%Change-Post: 2 %
FVC-%Pred-Post: 83 %
FVC-%Pred-Pre: 81 %
FVC-Post: 3.94 L
FVC-Pre: 3.83 L
Post FEV1/FVC ratio: 73 %
Post FEV6/FVC ratio: 100 %
Pre FEV1/FVC ratio: 71 %
Pre FEV6/FVC Ratio: 98 %
RV % pred: 83 %
RV: 2.16 L
TLC % pred: 80 %
TLC: 5.99 L

## 2021-02-11 NOTE — Progress Notes (Signed)
PFT done today. 

## 2021-02-11 NOTE — Telephone Encounter (Signed)
-----   Message from Laury Deep, RN sent at 02/11/2021 11:45 AM EST ----- Patient is all set for combo surgery on Wednesday 1/4 afternoon.  PFTS are being done today.  Thanks, [employer]

## 2021-02-11 NOTE — Telephone Encounter (Signed)
PCCM:  Orders placed for combo case with [facility] on 03/03/2020  Garner Nash, DO [facility] 02/11/2021 5:49 PM

## 2021-02-12 ENCOUNTER — Encounter: Payer: Medicare Other | Admitting: Thoracic Surgery (Cardiothoracic Vascular Surgery)

## 2021-02-12 ENCOUNTER — Encounter: Payer: Self-pay | Admitting: Pulmonary Disease

## 2021-02-13 ENCOUNTER — Other Ambulatory Visit: Payer: Self-pay | Admitting: Interventional Cardiology

## 2021-02-16 NOTE — Telephone Encounter (Signed)
Pt saw Dr. Kipp Brood 12/14. Pt is scheduled for a bornch 03/03/21. Nothing further needed.

## 2021-02-25 ENCOUNTER — Encounter (HOSPITAL_COMMUNITY): Payer: Self-pay

## 2021-02-25 ENCOUNTER — Ambulatory Visit (HOSPITAL_COMMUNITY)
Admission: RE | Admit: 2021-02-25 | Discharge: 2021-02-25 | Disposition: A | Discharge status: Home / Self Care | Payer: Medicare Other | Source: Ambulatory Visit | Attending: Pulmonary Disease | Admitting: Pulmonary Disease

## 2021-02-25 ENCOUNTER — Other Ambulatory Visit: Payer: Self-pay

## 2021-02-25 DIAGNOSIS — R911 Solitary pulmonary nodule: Secondary | ICD-10-CM | POA: Insufficient documentation

## 2021-02-25 DIAGNOSIS — J849 Interstitial pulmonary disease, unspecified: Secondary | ICD-10-CM | POA: Diagnosis not present

## 2021-02-25 DIAGNOSIS — I700 Atherosclerosis of aorta: Secondary | ICD-10-CM | POA: Diagnosis not present

## 2021-02-25 DIAGNOSIS — J439 Emphysema, unspecified: Secondary | ICD-10-CM | POA: Diagnosis not present

## 2021-02-25 NOTE — Pre-Procedure Instructions (Signed)
Surgical Instructions    Your procedure is scheduled on Wednesday, March 03, 2021 at 1:45 PM.  Report to [facility] Main Entrance "A" at 11:45 A.M., then check in with the Admitting office.  Call this number if you have problems the morning of surgery:  [phone]   If you have any questions prior to your surgery date call [phone]: Open Monday-Friday 8am-4pm    Remember:  Do not eat or drink after midnight the night before your surgery    Take these medicines the morning of surgery with A SIP OF WATER:  atorvastatin (LIPITOR) budesonide (ENTOCORT EC) HYDROcodone-acetaminophen (NORCO)  isosorbide mononitrate (IMDUR) metaxalone (SKELAXIN) metoprolol succinate (TOPROL-XL) pantoprazole (PROTONIX) PARoxetine (PAXIL) tamsulosin (FLOMAX)  IF NEEDED: acetaminophen (TYLENOL)  ALPRAZolam (XANAX)  hyoscyamine (LEVSIN, ANASPAZ) nitroGLYCERIN (NITROSTAT)   Follow your surgeon's instructions on when to stop clopidogrel (PLAVIX).  If no instructions were given by your surgeon then you will need to call the office to get those instructions.     As of today, STOP taking any Aspirin (unless otherwise instructed by your surgeon) Aleve, Naproxen, Ibuprofen, Motrin, Advil, Goody's, BC's, all herbal medications, fish oil, and all vitamins.                     Do NOT Smoke (Tobacco/Vaping) or drink Alcohol 24 hours prior to your procedure.  If you use a CPAP at night, you may bring all equipment for your overnight stay.   Contacts, glasses, piercing's, hearing aid's, dentures or partials may not be worn into surgery, please bring cases for these belongings.    For patients admitted to the hospital, discharge time will be determined by your treatment team.   Patients discharged the day of surgery will not be allowed to drive home, and someone needs to stay with them for 24 hours.  NO VISITORS WILL BE ALLOWED IN PRE-OP WHERE PATIENTS GET READY FOR SURGERY.  ONLY 1 SUPPORT PERSON MAY BE  PRESENT IN THE WAITING ROOM WHILE YOU ARE IN SURGERY.  IF YOU ARE TO BE ADMITTED, ONCE YOU ARE IN YOUR ROOM YOU WILL BE ALLOWED TWO (2) VISITORS.  Minor children may have two parents present. Special consideration for safety and communication needs will be reviewed on a case by case basis.   Special instructions:   [facility]- Preparing For Surgery  Before surgery, you can play an important role. Because skin is not sterile, your skin needs to be as free of germs as possible. You can reduce the number of germs on your skin by washing with CHG (chlorahexidine gluconate) Soap before surgery.  CHG is an antiseptic cleaner which kills germs and bonds with the skin to continue killing germs even after washing.    Oral Hygiene is also important to reduce your risk of infection.  Remember - BRUSH YOUR TEETH THE MORNING OF SURGERY WITH YOUR REGULAR TOOTHPASTE  Please do not use if you have an allergy to CHG or antibacterial soaps. If your skin becomes reddened/irritated stop using the CHG.  Do not shave (including legs and underarms) for at least 48 hours prior to first CHG shower. It is OK to shave your face.  Please follow these instructions carefully.   Shower the NIGHT BEFORE SURGERY and the MORNING OF SURGERY  If you chose to wash your hair, wash your hair first as usual with your normal shampoo.  After you shampoo, rinse your hair and body thoroughly to remove the shampoo.  Use CHG Soap as you  would any other liquid soap. You can apply CHG directly to the skin and wash gently with a scrungie or a clean washcloth.   Apply the CHG Soap to your body ONLY FROM THE NECK DOWN.  Do not use on open wounds or open sores. Avoid contact with your eyes, ears, mouth and genitals (private parts). Wash Face and genitals (private parts)  with your normal soap.   Wash thoroughly, paying special attention to the area where your surgery will be performed.  Thoroughly rinse your body with warm water from the  neck down.  DO NOT shower/wash with your normal soap after using and rinsing off the CHG Soap.  Pat yourself dry with a CLEAN TOWEL.  Wear CLEAN PAJAMAS to bed the night before surgery  Place CLEAN SHEETS on your bed the night before your surgery  DO NOT SLEEP WITH PETS.   Day of Surgery: Shower with CHG soap. Do not wear jewelry. Do not wear lotions, powders, colognes, or deodorant. Do not shave 48 hours prior to surgery.  Men may shave face and neck. Do not bring valuables to the hospital. [facility] is not responsible for any belongings or valuables. Wear Clean/Comfortable clothing the morning of surgery Remember to brush your teeth WITH YOUR REGULAR TOOTHPASTE.   Please read over the following fact sheets that you were given.   3 days prior to your procedure or After your COVID test   You are not required to quarantine however you are required to wear a well-fitting mask when you are out and around people not in your household. If your mask becomes wet or soiled, replace with a new one.   Wash your hands often with soap and water for 20 seconds or clean your hands with an alcohol-based hand sanitizer that contains at least 60% alcohol.   Do not share personal items.   Notify your provider:  o if you are in close contact with someone who has COVID  o or if you develop a fever of 100.4 or greater, sneezing, cough, sore throat, shortness of breath or body aches.

## 2021-03-02 ENCOUNTER — Ambulatory Visit (HOSPITAL_COMMUNITY)
Admission: RE | Admit: 2021-03-02 | Discharge: 2021-03-02 | Disposition: A | Discharge status: Home / Self Care | Payer: Medicare Other | Source: Ambulatory Visit | Attending: Thoracic Surgery (Cardiothoracic Vascular Surgery) | Admitting: Thoracic Surgery (Cardiothoracic Vascular Surgery)

## 2021-03-02 ENCOUNTER — Other Ambulatory Visit: Payer: Self-pay

## 2021-03-02 ENCOUNTER — Encounter (HOSPITAL_COMMUNITY)
Admission: RE | Admit: 2021-03-02 | Discharge: 2021-03-02 | Disposition: A | Discharge status: Home / Self Care | Payer: Medicare Other | Source: Ambulatory Visit | Attending: Pulmonary Disease | Admitting: Pulmonary Disease

## 2021-03-02 ENCOUNTER — Encounter (HOSPITAL_COMMUNITY): Payer: Self-pay

## 2021-03-02 VITALS — BP 134/65 | HR 59 | Temp 97.7°F | Resp 18 | Ht 72.0 in | Wt 218.0 lb

## 2021-03-02 DIAGNOSIS — Z20822 Contact with and (suspected) exposure to covid-19: Secondary | ICD-10-CM | POA: Diagnosis not present

## 2021-03-02 DIAGNOSIS — N400 Enlarged prostate without lower urinary tract symptoms: Secondary | ICD-10-CM | POA: Diagnosis not present

## 2021-03-02 DIAGNOSIS — Z5189 Encounter for other specified aftercare: Secondary | ICD-10-CM

## 2021-03-02 DIAGNOSIS — I10 Essential (primary) hypertension: Secondary | ICD-10-CM | POA: Diagnosis not present

## 2021-03-02 DIAGNOSIS — J439 Emphysema, unspecified: Secondary | ICD-10-CM | POA: Diagnosis not present

## 2021-03-02 DIAGNOSIS — C3411 Malignant neoplasm of upper lobe, right bronchus or lung: Secondary | ICD-10-CM | POA: Diagnosis not present

## 2021-03-02 DIAGNOSIS — Z9989 Dependence on other enabling machines and devices: Secondary | ICD-10-CM | POA: Diagnosis not present

## 2021-03-02 DIAGNOSIS — K219 Gastro-esophageal reflux disease without esophagitis: Secondary | ICD-10-CM | POA: Diagnosis present

## 2021-03-02 DIAGNOSIS — F32A Depression, unspecified: Secondary | ICD-10-CM | POA: Diagnosis not present

## 2021-03-02 DIAGNOSIS — I252 Old myocardial infarction: Secondary | ICD-10-CM | POA: Diagnosis not present

## 2021-03-02 DIAGNOSIS — J9382 Other air leak: Secondary | ICD-10-CM | POA: Diagnosis not present

## 2021-03-02 DIAGNOSIS — I2510 Atherosclerotic heart disease of native coronary artery without angina pectoris: Secondary | ICD-10-CM | POA: Diagnosis not present

## 2021-03-02 DIAGNOSIS — I739 Peripheral vascular disease, unspecified: Secondary | ICD-10-CM | POA: Diagnosis not present

## 2021-03-02 DIAGNOSIS — Z5181 Encounter for therapeutic drug level monitoring: Secondary | ICD-10-CM

## 2021-03-02 DIAGNOSIS — Z9049 Acquired absence of other specified parts of digestive tract: Secondary | ICD-10-CM | POA: Diagnosis not present

## 2021-03-02 DIAGNOSIS — I25810 Atherosclerosis of coronary artery bypass graft(s) without angina pectoris: Secondary | ICD-10-CM | POA: Insufficient documentation

## 2021-03-02 DIAGNOSIS — D62 Acute posthemorrhagic anemia: Secondary | ICD-10-CM | POA: Diagnosis not present

## 2021-03-02 DIAGNOSIS — Z951 Presence of aortocoronary bypass graft: Secondary | ICD-10-CM | POA: Diagnosis not present

## 2021-03-02 DIAGNOSIS — Z8249 Family history of ischemic heart disease and other diseases of the circulatory system: Secondary | ICD-10-CM | POA: Diagnosis not present

## 2021-03-02 DIAGNOSIS — E785 Hyperlipidemia, unspecified: Secondary | ICD-10-CM | POA: Diagnosis not present

## 2021-03-02 DIAGNOSIS — G4700 Insomnia, unspecified: Secondary | ICD-10-CM | POA: Diagnosis present

## 2021-03-02 DIAGNOSIS — C3490 Malignant neoplasm of unspecified part of unspecified bronchus or lung: Secondary | ICD-10-CM | POA: Diagnosis not present

## 2021-03-02 DIAGNOSIS — I714 Abdominal aortic aneurysm, without rupture, unspecified: Secondary | ICD-10-CM | POA: Insufficient documentation

## 2021-03-02 DIAGNOSIS — J8410 Pulmonary fibrosis, unspecified: Secondary | ICD-10-CM | POA: Diagnosis not present

## 2021-03-02 DIAGNOSIS — Z955 Presence of coronary angioplasty implant and graft: Secondary | ICD-10-CM | POA: Diagnosis not present

## 2021-03-02 DIAGNOSIS — Z96653 Presence of artificial knee joint, bilateral: Secondary | ICD-10-CM | POA: Diagnosis present

## 2021-03-02 DIAGNOSIS — R0602 Shortness of breath: Secondary | ICD-10-CM | POA: Insufficient documentation

## 2021-03-02 DIAGNOSIS — M545 Low back pain, unspecified: Secondary | ICD-10-CM | POA: Diagnosis present

## 2021-03-02 DIAGNOSIS — I440 Atrioventricular block, first degree: Secondary | ICD-10-CM | POA: Diagnosis present

## 2021-03-02 DIAGNOSIS — G8929 Other chronic pain: Secondary | ICD-10-CM | POA: Diagnosis present

## 2021-03-02 DIAGNOSIS — I2582 Chronic total occlusion of coronary artery: Secondary | ICD-10-CM | POA: Insufficient documentation

## 2021-03-02 DIAGNOSIS — G4733 Obstructive sleep apnea (adult) (pediatric): Secondary | ICD-10-CM | POA: Diagnosis not present

## 2021-03-02 DIAGNOSIS — J849 Interstitial pulmonary disease, unspecified: Secondary | ICD-10-CM | POA: Diagnosis not present

## 2021-03-02 DIAGNOSIS — F419 Anxiety disorder, unspecified: Secondary | ICD-10-CM | POA: Diagnosis not present

## 2021-03-02 DIAGNOSIS — R911 Solitary pulmonary nodule: Secondary | ICD-10-CM | POA: Diagnosis not present

## 2021-03-02 DIAGNOSIS — Z01818 Encounter for other preprocedural examination: Secondary | ICD-10-CM

## 2021-03-02 DIAGNOSIS — C3491 Malignant neoplasm of unspecified part of right bronchus or lung: Secondary | ICD-10-CM | POA: Diagnosis not present

## 2021-03-02 DIAGNOSIS — I517 Cardiomegaly: Secondary | ICD-10-CM | POA: Diagnosis not present

## 2021-03-02 LAB — URINALYSIS, ROUTINE W REFLEX MICROSCOPIC
Bilirubin Urine: NEGATIVE
Glucose, UA: NEGATIVE mg/dL
Hgb urine dipstick: NEGATIVE
Ketones, ur: NEGATIVE mg/dL
Leukocytes,Ua: NEGATIVE
Nitrite: NEGATIVE
Protein, ur: NEGATIVE mg/dL
Specific Gravity, Urine: 1.025 (ref 1.005–1.030)
pH: 6 (ref 5.0–8.0)

## 2021-03-02 LAB — BLOOD GAS, ARTERIAL
Acid-base deficit: 0.5 mmol/L (ref 0.0–2.0)
Bicarbonate: 23.7 mmol/L (ref 20.0–28.0)
FIO2: 21
O2 Saturation: 97.6 %
Patient temperature: 37
pCO2 arterial: 38.9 mmHg (ref 32.0–48.0)
pH, Arterial: 7.402 (ref 7.350–7.450)
pO2, Arterial: 102 mmHg (ref 83.0–108.0)

## 2021-03-02 LAB — CBC
HCT: 42.1 % (ref 39.0–52.0)
Hemoglobin: 14.5 g/dL (ref 13.0–17.0)
MCH: 33 pg (ref 26.0–34.0)
MCHC: 34.4 g/dL (ref 30.0–36.0)
MCV: 95.7 fL (ref 80.0–100.0)
Platelets: 331 10*3/uL (ref 150–400)
RBC: 4.4 MIL/uL (ref 4.22–5.81)
RDW: 12.4 % (ref 11.5–15.5)
WBC: 7.9 10*3/uL (ref 4.0–10.5)
nRBC: 0 % (ref 0.0–0.2)

## 2021-03-02 LAB — COMPREHENSIVE METABOLIC PANEL
ALT: 20 U/L (ref 0–44)
AST: 22 U/L (ref 15–41)
Albumin: 4.1 g/dL (ref 3.5–5.0)
Alkaline Phosphatase: 67 U/L (ref 38–126)
Anion gap: 9 (ref 5–15)
BUN: 23 mg/dL (ref 8–23)
CO2: 22 mmol/L (ref 22–32)
Calcium: 9.2 mg/dL (ref 8.9–10.3)
Chloride: 104 mmol/L (ref 98–111)
Creatinine, Ser: 0.81 mg/dL (ref 0.61–1.24)
GFR, Estimated: >60 mL/min (ref >60)
Glucose, Bld: 108 mg/dL — ABNORMAL HIGH (ref 70–99)
Potassium: 4.2 mmol/L (ref 3.5–5.1)
Sodium: 135 mmol/L (ref 135–145)
Total Bilirubin: 0.9 mg/dL (ref 0.3–1.2)
Total Protein: 6.5 g/dL (ref 6.5–8.1)

## 2021-03-02 LAB — TYPE AND SCREEN
ABO/RH(D): A POS
Antibody Screen: NEGATIVE

## 2021-03-02 LAB — SARS CORONAVIRUS 2 (TAT 6-24 HRS): SARS Coronavirus 2: NEGATIVE

## 2021-03-02 LAB — PROTIME-INR
INR: 0.9 (ref 0.8–1.2)
Prothrombin Time: 12.5 seconds (ref 11.4–15.2)

## 2021-03-02 LAB — APTT: aPTT: 37 seconds — ABNORMAL HIGH (ref 24–36)

## 2021-03-02 LAB — SURGICAL PCR SCREEN
MRSA, PCR: NEGATIVE
Staphylococcus aureus: POSITIVE — AB

## 2021-03-02 NOTE — Progress Notes (Signed)
PCP - Dr. Seward Carol Cardiologist - Dr. Daneen Schick Pulm: Dr. Marshell Garfinkel  PPM/ICD - n/a  Chest x-ray - 03/02/21 EKG - 03/02/21 Stress Test - 5+ years ago ECHO - 10/04/20 Cardiac Cath - 10/05/20  Sleep Study - OSA+ CPAP - denies  Blood Thinner Instructions: Plavix, LD 02/25/21 Aspirin Instructions: N/A  NPO at MD  COVID TEST- 03/02/21  Anesthesia review: Yes, HX of CAD.  Patient denies shortness of breath, fever, cough and chest pain at PAT appointment   All instructions explained to the patient, with a verbal understanding of the material. Patient agrees to go over the instructions while at home for a better understanding. Patient also instructed to self quarantine after being tested for COVID-19. The opportunity to ask questions was provided.

## 2021-03-02 NOTE — Progress Notes (Signed)
Anesthesia Chart Review:  Hx of CAD s/p CABG to the RCA following failed PCI 1989 with subsequent DES to graft, PAD followed by vascular surgery, AAA, hypertension. He presented to the hospital 10/03/2020 with chest pain.  EKG showed nonspecific changes, troponins negative x2, chest x-ray with lung scarring.  Echo showed normal LVEF and no wall motion abnormalities.  Cardiac catheterization showed severe single-vessel CAD with ostial CTO of RCA.  Mild luminal irregularities seen in left coronary artery.  Patent SVG-R DA with patent mid graft stent and moderate proximal/mid graft disease which was stable or minimally worse compared to prior catheterization in 2012.  Imdur 30 mg daily was added.  He was encouraged to follow-up with pulmonology given ILD. Last seen in outpatient followup 11/20/20. Per note, "stable with no anginal symptoms.  Recent cardiac catheterization with stable disease and no interventional target.  GDMT includes clopidogrel, Imdur, atorvastatin.  Omeprazole has been appropriately transition to pantoprazole due to potential interaction with clopidogrel. Heart healthy diet and regular cardiovascular exercise encouraged." 6 month followup recommended.   History of pulmonary fibrosis initially diagnosed in 48.  Reportedly treated with high-dose steroids in the past but has been stable for several decades per his report.  Recently referred to cardiothoracic surgery for pulmonary nodule. Updated pulmonary function testing was done 02/11/2021.  Results below.  OSA on CPAP, inconsistent use.  Patient reports last dose Plavix 02/25/2021.  Preop labs reviewed, unremarkable.   EKG 03/02/21: Sinus bradycardia. Rate 58.  CT Chest 02/25/21: IMPRESSION: 1. Stable 2.2 cm spiculated right upper lobe pulmonary lesion consistent with primary lung neoplasm. 2. No mediastinal or hilar mass or adenopathy. 3. Stable interstitial lung disease and emphysema. 4. Stable surgical changes from coronary  artery bypass surgery.  PFTs 02/11/2021: FVC-%Pred-Pre % 81  FEV1-%Pred-Pre % 78  FEV1FVC-%Pred-Pre % 96  TLC % pred % 80  RV % pred % 83  DLCO unc % pred % 64   Cath 10/05/20: Conclusions: Severe single-vessel coronary artery disease with chronic total occlusion of the ostial RCA.  No significant disease noted in the left coronary artery. Patent SVG-RCA with patent mid-graft stent and moderate proximal/mid graft disease that is stable or minimally worse compared to prior catheterization in 2012. Normal left and right heart filling pressures. Low normal Fick cardiac output/index.   Recommendations: Continue aggressive secondary prevention. Escalate medical therapy and consider further evaluation for alternative causes of shortness of breath and chest pain.  TTE 10/04/20:  1. Left ventricular ejection fraction, by estimation, is 60 to 65%. The  left ventricle has normal function. The left ventricle has no regional  wall motion abnormalities. There is moderate left ventricular hypertrophy.  Left ventricular diastolic  parameters are consistent with Grade I diastolic dysfunction (impaired  relaxation).   2. Right ventricular systolic function is mildly reduced at the apex. The  right ventricular size is mildly enlarged. Tricuspid regurgitation signal  is inadequate for assessing PA pressure.   3. The mitral valve is normal in structure. Trivial mitral valve  regurgitation. No evidence of mitral stenosis.   4. The aortic valve is grossly normal. There is mild calcification of the  aortic valve. Aortic valve regurgitation is not visualized. Mild aortic  valve sclerosis is present, with no evidence of aortic valve stenosis.   5. The inferior vena cava is normal in size with greater than 50%  respiratory variability, suggesting right atrial pressure of 3 mmHg.    Karoline Caldwell, PA-C [facility] Short Stay Center/Anesthesiology Phone [phone]  03/02/2021 12:26 PM

## 2021-03-02 NOTE — Anesthesia Preprocedure Evaluation (Addendum)
Anesthesia Evaluation  Patient identified by MRN, date of birth, ID band Patient awake    Reviewed: Allergy & Precautions, H&P , NPO status , Patient's Chart, lab work & pertinent test results, reviewed documented beta blocker date and time   Airway Mallampati: II  TM Distance: >3 FB Neck ROM: Full    Dental no notable dental hx. (+) Teeth Intact, Dental Advisory Given   Pulmonary sleep apnea , former smoker,    Pulmonary exam normal breath sounds clear to auscultation       Cardiovascular hypertension, Pt. on medications and Pt. on home beta blockers + CAD, + CABG and + Peripheral Vascular Disease   Rhythm:Regular Rate:Normal     Neuro/Psych Anxiety Depression negative neurological ROS     GI/Hepatic Neg liver ROS, GERD  Medicated,  Endo/Other  negative endocrine ROS  Renal/GU negative Renal ROS  negative genitourinary   Musculoskeletal  (+) Arthritis , Osteoarthritis,    Abdominal   Peds  Hematology negative hematology ROS (+)   Anesthesia Other Findings   Reproductive/Obstetrics negative OB ROS                           Anesthesia Physical Anesthesia Plan  ASA: 3  Anesthesia Plan: General   Post-op Pain Management: Tylenol PO (pre-op)   Induction: Intravenous  PONV Risk Score and Plan: 3 and Ondansetron, Dexamethasone and Treatment may vary due to age or medical condition  Airway Management Planned: Oral ETT and Double Lumen EBT  Additional Equipment:   Intra-op Plan:   Post-operative Plan: Extubation in OR  Informed Consent: I have reviewed the patients History and Physical, chart, labs and discussed the procedure including the risks, benefits and alternatives for the proposed anesthesia with the patient or authorized representative who has indicated his/her understanding and acceptance.     Dental advisory given  Plan Discussed with: CRNA and Surgeon  Anesthesia  Plan Comments: (PAT note by Karoline Caldwell, PA-C: Hx of CAD s/p CABG to the RCA following failed PCI 1989 with subsequent DES to graft, PAD followed by vascular surgery, AAA, hypertension. He presented to the hospital 10/03/2020 with chest pain. EKG showed nonspecific changes, troponins negative x2, chest x-ray with lung scarring. Echo showed normal LVEF and no wall motion abnormalities. Cardiac catheterization showed severe single-vessel CAD with ostial CTO of RCA. Mild luminal irregularities seen in left coronary artery. Patent SVG-R DA with patent mid graft stent and moderate proximal/mid graft disease which was stable or minimally worse compared to prior catheterization in 2012. Imdur 30 mg daily was added. He was encouraged to follow-up with pulmonology given ILD. Last seen in outpatient followup 11/20/20. Per note, "stable with no anginal symptoms. Recent cardiac catheterization with stable disease and no interventional target. GDMT includes clopidogrel, Imdur, atorvastatin. Omeprazole has been appropriately transition to pantoprazole due to potential interaction with clopidogrel. Heart healthy diet and regular cardiovascular exercise encouraged." 6 month followup recommended.   History of pulmonary fibrosis initially diagnosed in 62.  Reportedly treated with high-dose steroids in the past but has been stable for several decades per his report.  Recently referred to cardiothoracic surgery for pulmonary nodule. Updated pulmonary function testing was done 02/11/2021.  Results below.  OSA on CPAP, inconsistent use.  Patient reports last dose Plavix 02/25/2021.  Preop labs reviewed, unremarkable.   EKG 03/02/21: Sinus bradycardia. Rate 58.  CT Chest 02/25/21: IMPRESSION: 1. 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 2. No mediastinal or hilar mass or adenopathy. 3. Stable interstitial lung disease and emphysema. 4. Stable surgical  changes from coronary artery bypass surgery.  PFTs 02/11/2021: FVC-%Pred-Pre % 81 FEV1-%Pred-Pre % 78 FEV1FVC-%Pred-Pre % 96 TLC % pred % 80 RV % pred % 83 DLCO unc % pred % 64  Cath 10/05/20: Conclusions: 1. Severe single-vessel coronary artery disease with chronic total occlusion of the ostial RCA. No significant disease noted in the left coronary artery. 1. Patent SVG-RCA with patent mid-graft stent and moderate proximal/mid graft disease that is stable or minimally worse compared to prior catheterization in 2012. 2. Normal left and right heart filling pressures. 3. Low normal Fick cardiac output/index.  Recommendations: 1. Continue aggressive secondary prevention. 2. Escalate medical therapy and consider further evaluation for alternative causes of shortness of breath and chest pain.  TTE 10/04/20: 1. Left ventricular ejection fraction, by estimation, is 60 to 65%. The  left ventricle has normal function. The left ventricle has no regional  wall motion abnormalities. There is moderate left ventricular hypertrophy.  Left ventricular diastolic  parameters are consistent with Grade I diastolic dysfunction (impaired  relaxation).  2. Right ventricular systolic function is mildly reduced at the apex. The  right ventricular size is mildly enlarged. Tricuspid regurgitation signal  is inadequate for assessing PA pressure.  3. The mitral valve is normal in structure. Trivial mitral valve  regurgitation. No evidence of mitral stenosis.  4. The aortic valve is grossly normal. There is mild calcification of the  aortic valve. Aortic valve regurgitation is not visualized. Mild aortic  valve sclerosis is present, with no evidence of aortic valve stenosis.  5. The inferior vena cava is normal in size with greater than 50%  respiratory variability, suggesting right atrial pressure of 3 mmHg.   )      Anesthesia Quick Evaluation

## 2021-03-03 ENCOUNTER — Ambulatory Visit (HOSPITAL_COMMUNITY): Payer: Medicare Other | Admitting: Certified Registered"

## 2021-03-03 ENCOUNTER — Ambulatory Visit (HOSPITAL_COMMUNITY): Payer: Medicare Other

## 2021-03-03 ENCOUNTER — Other Ambulatory Visit: Payer: Self-pay

## 2021-03-03 ENCOUNTER — Inpatient Hospital Stay (HOSPITAL_COMMUNITY)
Admission: RE | Admit: 2021-03-03 | Discharge: 2021-03-07 | DRG: 164 | Disposition: A | Discharge status: Home / Self Care | Payer: Medicare Other | Attending: Thoracic Surgery (Cardiothoracic Vascular Surgery) | Admitting: Thoracic Surgery (Cardiothoracic Vascular Surgery)

## 2021-03-03 ENCOUNTER — Encounter (HOSPITAL_COMMUNITY)
Admission: RE | Disposition: A | Discharge status: Home / Self Care | Payer: Self-pay | Source: Home / Self Care | Attending: Thoracic Surgery (Cardiothoracic Vascular Surgery)

## 2021-03-03 ENCOUNTER — Encounter (HOSPITAL_COMMUNITY): Payer: Self-pay | Admitting: Pulmonary Disease

## 2021-03-03 ENCOUNTER — Ambulatory Visit (HOSPITAL_COMMUNITY): Payer: Medicare Other | Admitting: Physician Assistant

## 2021-03-03 ENCOUNTER — Inpatient Hospital Stay (HOSPITAL_COMMUNITY): Payer: Medicare Other

## 2021-03-03 DIAGNOSIS — R911 Solitary pulmonary nodule: Secondary | ICD-10-CM | POA: Diagnosis not present

## 2021-03-03 DIAGNOSIS — I10 Essential (primary) hypertension: Secondary | ICD-10-CM | POA: Diagnosis not present

## 2021-03-03 DIAGNOSIS — Z7902 Long term (current) use of antithrombotics/antiplatelets: Secondary | ICD-10-CM

## 2021-03-03 DIAGNOSIS — Z951 Presence of aortocoronary bypass graft: Secondary | ICD-10-CM | POA: Diagnosis not present

## 2021-03-03 DIAGNOSIS — C3411 Malignant neoplasm of upper lobe, right bronchus or lung: Secondary | ICD-10-CM | POA: Diagnosis not present

## 2021-03-03 DIAGNOSIS — F419 Anxiety disorder, unspecified: Secondary | ICD-10-CM | POA: Diagnosis present

## 2021-03-03 DIAGNOSIS — Z9049 Acquired absence of other specified parts of digestive tract: Secondary | ICD-10-CM | POA: Diagnosis not present

## 2021-03-03 DIAGNOSIS — G8929 Other chronic pain: Secondary | ICD-10-CM | POA: Diagnosis present

## 2021-03-03 DIAGNOSIS — N400 Enlarged prostate without lower urinary tract symptoms: Secondary | ICD-10-CM | POA: Diagnosis not present

## 2021-03-03 DIAGNOSIS — E785 Hyperlipidemia, unspecified: Secondary | ICD-10-CM | POA: Diagnosis present

## 2021-03-03 DIAGNOSIS — I2510 Atherosclerotic heart disease of native coronary artery without angina pectoris: Secondary | ICD-10-CM | POA: Diagnosis present

## 2021-03-03 DIAGNOSIS — J439 Emphysema, unspecified: Secondary | ICD-10-CM | POA: Diagnosis not present

## 2021-03-03 DIAGNOSIS — C3490 Malignant neoplasm of unspecified part of unspecified bronchus or lung: Secondary | ICD-10-CM | POA: Diagnosis not present

## 2021-03-03 DIAGNOSIS — I440 Atrioventricular block, first degree: Secondary | ICD-10-CM | POA: Diagnosis present

## 2021-03-03 DIAGNOSIS — M545 Low back pain, unspecified: Secondary | ICD-10-CM | POA: Diagnosis present

## 2021-03-03 DIAGNOSIS — Z20822 Contact with and (suspected) exposure to covid-19: Secondary | ICD-10-CM | POA: Diagnosis present

## 2021-03-03 DIAGNOSIS — Z96653 Presence of artificial knee joint, bilateral: Secondary | ICD-10-CM | POA: Diagnosis present

## 2021-03-03 DIAGNOSIS — Z8249 Family history of ischemic heart disease and other diseases of the circulatory system: Secondary | ICD-10-CM | POA: Diagnosis not present

## 2021-03-03 DIAGNOSIS — C3491 Malignant neoplasm of unspecified part of right bronchus or lung: Secondary | ICD-10-CM | POA: Diagnosis not present

## 2021-03-03 DIAGNOSIS — G4733 Obstructive sleep apnea (adult) (pediatric): Secondary | ICD-10-CM | POA: Diagnosis present

## 2021-03-03 DIAGNOSIS — Z888 Allergy status to other drugs, medicaments and biological substances status: Secondary | ICD-10-CM

## 2021-03-03 DIAGNOSIS — J9382 Other air leak: Secondary | ICD-10-CM | POA: Diagnosis not present

## 2021-03-03 DIAGNOSIS — I252 Old myocardial infarction: Secondary | ICD-10-CM

## 2021-03-03 DIAGNOSIS — J8410 Pulmonary fibrosis, unspecified: Secondary | ICD-10-CM | POA: Diagnosis present

## 2021-03-03 DIAGNOSIS — J849 Interstitial pulmonary disease, unspecified: Secondary | ICD-10-CM

## 2021-03-03 DIAGNOSIS — Z9989 Dependence on other enabling machines and devices: Secondary | ICD-10-CM | POA: Diagnosis not present

## 2021-03-03 DIAGNOSIS — Z955 Presence of coronary angioplasty implant and graft: Secondary | ICD-10-CM

## 2021-03-03 DIAGNOSIS — F32A Depression, unspecified: Secondary | ICD-10-CM | POA: Diagnosis not present

## 2021-03-03 DIAGNOSIS — D62 Acute posthemorrhagic anemia: Secondary | ICD-10-CM | POA: Diagnosis not present

## 2021-03-03 DIAGNOSIS — K219 Gastro-esophageal reflux disease without esophagitis: Secondary | ICD-10-CM | POA: Diagnosis present

## 2021-03-03 DIAGNOSIS — Z87891 Personal history of nicotine dependence: Secondary | ICD-10-CM

## 2021-03-03 DIAGNOSIS — Z902 Acquired absence of lung [part of]: Secondary | ICD-10-CM

## 2021-03-03 DIAGNOSIS — G4700 Insomnia, unspecified: Secondary | ICD-10-CM | POA: Diagnosis present

## 2021-03-03 DIAGNOSIS — Z419 Encounter for procedure for purposes other than remedying health state, unspecified: Secondary | ICD-10-CM

## 2021-03-03 DIAGNOSIS — I739 Peripheral vascular disease, unspecified: Secondary | ICD-10-CM | POA: Diagnosis present

## 2021-03-03 DIAGNOSIS — J939 Pneumothorax, unspecified: Secondary | ICD-10-CM

## 2021-03-03 DIAGNOSIS — I517 Cardiomegaly: Secondary | ICD-10-CM | POA: Diagnosis not present

## 2021-03-03 DIAGNOSIS — Z7952 Long term (current) use of systemic steroids: Secondary | ICD-10-CM

## 2021-03-03 DIAGNOSIS — Z79899 Other long term (current) drug therapy: Secondary | ICD-10-CM

## 2021-03-03 HISTORY — PX: BRONCHIAL BIOPSY: SHX5109

## 2021-03-03 HISTORY — PX: VIDEO BRONCHOSCOPY WITH RADIAL ENDOBRONCHIAL ULTRASOUND: SHX6849

## 2021-03-03 HISTORY — PX: FIDUCIAL MARKER PLACEMENT: SHX6858

## 2021-03-03 HISTORY — PX: INTERCOSTAL NERVE BLOCK: SHX5021

## 2021-03-03 HISTORY — PX: LYMPH NODE BIOPSY: SHX201

## 2021-03-03 HISTORY — PX: LYMPH NODE DISSECTION: SHX5087

## 2021-03-03 HISTORY — PX: BRONCHIAL NEEDLE ASPIRATION BIOPSY: SHX5106

## 2021-03-03 HISTORY — PX: BRONCHIAL BRUSHINGS: SHX5108

## 2021-03-03 LAB — GLUCOSE, CAPILLARY: Glucose-Capillary: 132 mg/dL — ABNORMAL HIGH (ref 70–99)

## 2021-03-03 SURGERY — BRONCHOSCOPY, WITH BIOPSY USING ELECTROMAGNETIC NAVIGATION
Anesthesia: General

## 2021-03-03 SURGERY — WEDGE RESECTION, LUNG, ROBOT-ASSISTED, THORACOSCOPIC
Anesthesia: General | Site: Chest | Laterality: Right

## 2021-03-03 MED ORDER — HYOSCYAMINE SULFATE 0.125 MG SL SUBL
0.1250 mg | SUBLINGUAL_TABLET | SUBLINGUAL | Status: DC | PRN
  Filled 2021-03-03 (×3): qty 1

## 2021-03-03 MED ORDER — ACETAMINOPHEN 160 MG/5ML PO SOLN: 1000.0000 mg | Freq: Four times a day (QID) | ORAL | Status: DC

## 2021-03-03 MED ORDER — BISACODYL 5 MG PO TBEC
10.0000 mg | DELAYED_RELEASE_TABLET | Freq: Every day | ORAL | Status: DC
  Administered 2021-03-03 – 2021-03-07 (×5): 10 mg via ORAL
  Filled 2021-03-03 (×5): qty 2

## 2021-03-03 MED ORDER — HYDROMORPHONE HCL 1 MG/ML IJ SOLN
INTRAMUSCULAR | Status: AC
  Filled 2021-03-03: qty 1

## 2021-03-03 MED ORDER — PROPOFOL 10 MG/ML IV BOLUS
INTRAVENOUS | Status: DC | PRN
Start: 2021-03-03 — End: 2021-03-03
  Administered 2021-03-03: 50 mg via INTRAVENOUS
  Administered 2021-03-03: 120 mg via INTRAVENOUS

## 2021-03-03 MED ORDER — EPHEDRINE 5 MG/ML INJ
INTRAVENOUS | Status: AC
  Filled 2021-03-03: qty 15

## 2021-03-03 MED ORDER — 0.9 % SODIUM CHLORIDE (POUR BTL) OPTIME
TOPICAL | Status: DC | PRN
  Administered 2021-03-03 (×2): 1000 mL

## 2021-03-03 MED ORDER — SODIUM CHLORIDE FLUSH 0.9 % IV SOLN
INTRAVENOUS | Status: DC | PRN
  Administered 2021-03-03: 100 mL

## 2021-03-03 MED ORDER — METOPROLOL SUCCINATE ER 25 MG PO TB24
ORAL_TABLET | ORAL | Status: AC
  Administered 2021-03-03: 50 mg via ORAL
  Filled 2021-03-03: qty 2

## 2021-03-03 MED ORDER — ACETAMINOPHEN 500 MG PO TABS
1000.0000 mg | ORAL_TABLET | Freq: Four times a day (QID) | ORAL | Status: DC
  Administered 2021-03-03 – 2021-03-07 (×9): 1000 mg via ORAL
  Filled 2021-03-03 (×11): qty 2

## 2021-03-03 MED ORDER — PAROXETINE HCL 20 MG PO TABS
40.0000 mg | ORAL_TABLET | Freq: Every morning | ORAL | Status: DC
  Administered 2021-03-04 – 2021-03-07 (×4): 40 mg via ORAL
  Filled 2021-03-03 (×4): qty 2

## 2021-03-03 MED ORDER — SUCCINYLCHOLINE CHLORIDE 200 MG/10ML IV SOSY
PREFILLED_SYRINGE | INTRAVENOUS | Status: AC
  Filled 2021-03-03: qty 20

## 2021-03-03 MED ORDER — ONDANSETRON HCL 4 MG/2ML IJ SOLN
INTRAMUSCULAR | Status: DC | PRN
Start: 2021-03-03 — End: 2021-03-03
  Administered 2021-03-03: 4 mg via INTRAVENOUS

## 2021-03-03 MED ORDER — TAMSULOSIN HCL 0.4 MG PO CAPS
0.4000 mg | ORAL_CAPSULE | Freq: Two times a day (BID) | ORAL | Status: DC
  Administered 2021-03-03 – 2021-03-07 (×8): 0.4 mg via ORAL
  Filled 2021-03-03 (×8): qty 1

## 2021-03-03 MED ORDER — ALPRAZOLAM 0.5 MG PO TABS
0.2500 mg | ORAL_TABLET | Freq: Three times a day (TID) | ORAL | Status: DC | PRN
  Administered 2021-03-04 – 2021-03-06 (×2): 0.5 mg via ORAL
  Filled 2021-03-03 (×2): qty 1

## 2021-03-03 MED ORDER — METHYLENE BLUE 0.5 % INJ SOLN
INTRAVENOUS | Status: DC | PRN
  Administered 2021-03-03: 1.5 mL

## 2021-03-03 MED ORDER — SUGAMMADEX SODIUM 200 MG/2ML IV SOLN
INTRAVENOUS | Status: DC | PRN
  Administered 2021-03-03: 200 mg via INTRAVENOUS

## 2021-03-03 MED ORDER — EPHEDRINE SULFATE-NACL 50-0.9 MG/10ML-% IV SOSY
PREFILLED_SYRINGE | INTRAVENOUS | Status: DC | PRN
  Administered 2021-03-03 (×2): 5 mg via INTRAVENOUS

## 2021-03-03 MED ORDER — CEFAZOLIN SODIUM-DEXTROSE 2-4 GM/100ML-% IV SOLN
2.0000 g | Freq: Three times a day (TID) | INTRAVENOUS | Status: AC
  Administered 2021-03-03 – 2021-03-04 (×2): 2 g via INTRAVENOUS
  Filled 2021-03-03 (×2): qty 100

## 2021-03-03 MED ORDER — ATORVASTATIN CALCIUM 80 MG PO TABS
80.0000 mg | ORAL_TABLET | Freq: Every day | ORAL | Status: DC
  Administered 2021-03-04 – 2021-03-07 (×4): 80 mg via ORAL
  Filled 2021-03-03 (×4): qty 1

## 2021-03-03 MED ORDER — FENTANYL CITRATE (PF) 250 MCG/5ML IJ SOLN
INTRAMUSCULAR | Status: AC
  Filled 2021-03-03: qty 5

## 2021-03-03 MED ORDER — PHENYLEPHRINE 40 MCG/ML (10ML) SYRINGE FOR IV PUSH (FOR BLOOD PRESSURE SUPPORT)
PREFILLED_SYRINGE | INTRAVENOUS | Status: AC
  Filled 2021-03-03: qty 10

## 2021-03-03 MED ORDER — METOPROLOL SUCCINATE ER 50 MG PO TB24: 50.0000 mg | ORAL_TABLET | Freq: Once | ORAL | Status: AC

## 2021-03-03 MED ORDER — ISOSORBIDE MONONITRATE ER 30 MG PO TB24
30.0000 mg | ORAL_TABLET | Freq: Every day | ORAL | Status: DC
  Administered 2021-03-04 – 2021-03-07 (×4): 30 mg via ORAL
  Filled 2021-03-03 (×4): qty 1

## 2021-03-03 MED ORDER — METOPROLOL SUCCINATE ER 25 MG PO TB24
25.0000 mg | ORAL_TABLET | Freq: Every day | ORAL | Status: DC
  Administered 2021-03-04 – 2021-03-07 (×4): 25 mg via ORAL
  Filled 2021-03-03 (×4): qty 1

## 2021-03-03 MED ORDER — KETOROLAC TROMETHAMINE 15 MG/ML IJ SOLN
15.0000 mg | Freq: Four times a day (QID) | INTRAMUSCULAR | Status: AC
  Administered 2021-03-03 – 2021-03-05 (×8): 15 mg via INTRAVENOUS
  Filled 2021-03-03 (×8): qty 1

## 2021-03-03 MED ORDER — BUDESONIDE 3 MG PO CPEP
3.0000 mg | ORAL_CAPSULE | Freq: Every evening | ORAL | Status: DC
  Filled 2021-03-03: qty 1

## 2021-03-03 MED ORDER — ENOXAPARIN SODIUM 40 MG/0.4ML IJ SOSY
40.0000 mg | PREFILLED_SYRINGE | Freq: Every day | INTRAMUSCULAR | Status: DC
  Administered 2021-03-04 – 2021-03-07 (×4): 40 mg via SUBCUTANEOUS
  Filled 2021-03-03 (×4): qty 0.4

## 2021-03-03 MED ORDER — HYOSCYAMINE SULFATE 0.125 MG PO TABS: 0.1250 mg | ORAL_TABLET | ORAL | Status: DC | PRN

## 2021-03-03 MED ORDER — ROCURONIUM BROMIDE 10 MG/ML (PF) SYRINGE
PREFILLED_SYRINGE | INTRAVENOUS | Status: AC
  Filled 2021-03-03: qty 10

## 2021-03-03 MED ORDER — LACTATED RINGERS IV SOLN: INTRAVENOUS | Status: DC

## 2021-03-03 MED ORDER — ONDANSETRON HCL 4 MG/2ML IJ SOLN
4.0000 mg | Freq: Four times a day (QID) | INTRAMUSCULAR | Status: DC | PRN
  Administered 2021-03-04: 4 mg via INTRAVENOUS
  Filled 2021-03-03: qty 2

## 2021-03-03 MED ORDER — MIDAZOLAM HCL 2 MG/2ML IJ SOLN
INTRAMUSCULAR | Status: AC
  Filled 2021-03-03: qty 2

## 2021-03-03 MED ORDER — SENNOSIDES-DOCUSATE SODIUM 8.6-50 MG PO TABS
1.0000 | ORAL_TABLET | Freq: Every day | ORAL | Status: DC
  Administered 2021-03-03 – 2021-03-06 (×3): 1 via ORAL
  Filled 2021-03-03 (×4): qty 1

## 2021-03-03 MED ORDER — ORAL CARE MOUTH RINSE: 15.0000 mL | Freq: Once | OROMUCOSAL | Status: AC

## 2021-03-03 MED ORDER — LIDOCAINE 2% (20 MG/ML) 5 ML SYRINGE
INTRAMUSCULAR | Status: DC | PRN
  Administered 2021-03-03: 60 mg via INTRAVENOUS

## 2021-03-03 MED ORDER — FENTANYL CITRATE (PF) 100 MCG/2ML IJ SOLN
INTRAMUSCULAR | Status: DC | PRN
  Administered 2021-03-03: 25 ug via INTRAVENOUS
  Administered 2021-03-03 (×2): 50 ug via INTRAVENOUS
  Administered 2021-03-03: 25 ug via INTRAVENOUS

## 2021-03-03 MED ORDER — LIDOCAINE 2% (20 MG/ML) 5 ML SYRINGE
INTRAMUSCULAR | Status: AC
  Filled 2021-03-03: qty 10

## 2021-03-03 MED ORDER — IPRATROPIUM-ALBUTEROL 0.5-2.5 (3) MG/3ML IN SOLN
3.0000 mL | Freq: Four times a day (QID) | RESPIRATORY_TRACT | Status: DC
  Administered 2021-03-04 (×2): 3 mL via RESPIRATORY_TRACT
  Filled 2021-03-03 (×2): qty 3

## 2021-03-03 MED ORDER — BUPIVACAINE LIPOSOME 1.3 % IJ SUSP
INTRAMUSCULAR | Status: AC
  Filled 2021-03-03: qty 20

## 2021-03-03 MED ORDER — CEFAZOLIN SODIUM-DEXTROSE 2-4 GM/100ML-% IV SOLN
INTRAVENOUS | Status: AC
  Filled 2021-03-03: qty 100

## 2021-03-03 MED ORDER — PROPOFOL 10 MG/ML IV BOLUS
INTRAVENOUS | Status: AC
  Filled 2021-03-03: qty 20

## 2021-03-03 MED ORDER — HYDROCODONE-ACETAMINOPHEN 7.5-325 MG PO TABS
1.0000 | ORAL_TABLET | ORAL | Status: DC | PRN
  Administered 2021-03-03 – 2021-03-07 (×13): 1 via ORAL
  Filled 2021-03-03 (×13): qty 1

## 2021-03-03 MED ORDER — LIDOCAINE 2% (20 MG/ML) 5 ML SYRINGE
INTRAMUSCULAR | Status: AC
  Filled 2021-03-03: qty 5

## 2021-03-03 MED ORDER — METAXALONE 800 MG PO TABS
800.0000 mg | ORAL_TABLET | Freq: Three times a day (TID) | ORAL | Status: DC | PRN
  Administered 2021-03-04 – 2021-03-06 (×6): 800 mg via ORAL
  Filled 2021-03-03 (×8): qty 1

## 2021-03-03 MED ORDER — HEMOSTATIC AGENTS (NO CHARGE) OPTIME
TOPICAL | Status: DC | PRN
  Administered 2021-03-03: 1 via TOPICAL

## 2021-03-03 MED ORDER — BUDESONIDE 3 MG PO CPEP
3.0000 mg | ORAL_CAPSULE | Freq: Every evening | ORAL | Status: DC
  Administered 2021-03-03 – 2021-03-06 (×4): 3 mg via ORAL
  Filled 2021-03-03 (×5): qty 1

## 2021-03-03 MED ORDER — CEFAZOLIN SODIUM-DEXTROSE 2-4 GM/100ML-% IV SOLN
2.0000 g | INTRAVENOUS | Status: AC
  Administered 2021-03-03 (×2): 2 g via INTRAVENOUS

## 2021-03-03 MED ORDER — ONDANSETRON HCL 4 MG/2ML IJ SOLN
INTRAMUSCULAR | Status: AC
  Filled 2021-03-03: qty 4

## 2021-03-03 MED ORDER — CHLORHEXIDINE GLUCONATE 0.12 % MT SOLN
OROMUCOSAL | Status: AC
  Administered 2021-03-03: 15 mL via OROMUCOSAL
  Filled 2021-03-03: qty 15

## 2021-03-03 MED ORDER — ONDANSETRON HCL 4 MG/2ML IJ SOLN
INTRAMUSCULAR | Status: AC
  Filled 2021-03-03: qty 2

## 2021-03-03 MED ORDER — BUPIVACAINE HCL (PF) 0.5 % IJ SOLN
INTRAMUSCULAR | Status: AC
  Filled 2021-03-03: qty 30

## 2021-03-03 MED ORDER — ROCURONIUM BROMIDE 10 MG/ML (PF) SYRINGE
PREFILLED_SYRINGE | INTRAVENOUS | Status: DC | PRN
Start: 2021-03-03 — End: 2021-03-03
  Administered 2021-03-03: 10 mg via INTRAVENOUS
  Administered 2021-03-03: 70 mg via INTRAVENOUS
  Administered 2021-03-03: 30 mg via INTRAVENOUS
  Administered 2021-03-03: 10 mg via INTRAVENOUS

## 2021-03-03 MED ORDER — PANTOPRAZOLE SODIUM 40 MG PO TBEC
40.0000 mg | DELAYED_RELEASE_TABLET | Freq: Every day | ORAL | Status: DC
  Administered 2021-03-04 – 2021-03-07 (×4): 40 mg via ORAL
  Filled 2021-03-03 (×4): qty 1

## 2021-03-03 MED ORDER — CHLORHEXIDINE GLUCONATE 0.12 % MT SOLN
15.0000 mL | Freq: Once | OROMUCOSAL | Status: AC
  Filled 2021-03-03: qty 15

## 2021-03-03 MED ORDER — MIDAZOLAM HCL 5 MG/5ML IJ SOLN
INTRAMUSCULAR | Status: DC | PRN
Start: 2021-03-03 — End: 2021-03-03
  Administered 2021-03-03: 2 mg via INTRAVENOUS

## 2021-03-03 MED ORDER — ACETAMINOPHEN 500 MG PO TABS
1000.0000 mg | ORAL_TABLET | Freq: Once | ORAL | Status: AC
  Administered 2021-03-03: 1000 mg via ORAL
  Filled 2021-03-03: qty 2

## 2021-03-03 MED ORDER — HYDROMORPHONE HCL 1 MG/ML IJ SOLN
0.2500 mg | INTRAMUSCULAR | Status: DC | PRN
  Administered 2021-03-03 (×4): 0.5 mg via INTRAVENOUS

## 2021-03-03 MED ORDER — BUDESONIDE 3 MG PO CPEP
6.0000 mg | ORAL_CAPSULE | Freq: Every day | ORAL | Status: DC
  Administered 2021-03-04 – 2021-03-07 (×4): 6 mg via ORAL
  Filled 2021-03-03 (×4): qty 2

## 2021-03-03 MED ORDER — DEXAMETHASONE SODIUM PHOSPHATE 10 MG/ML IJ SOLN
INTRAMUSCULAR | Status: AC
  Filled 2021-03-03: qty 1

## 2021-03-03 SURGICAL SUPPLY — 123 items
ADH SKN CLS APL DERMABOND .7 (GAUZE/BANDAGES/DRESSINGS) ×2
APL PRP STRL LF DISP 70% ISPRP (MISCELLANEOUS) ×2
BAG SPEC RTRVL C125 8X14 (MISCELLANEOUS) ×2
BAG SPEC RTRVL C1550 15 (MISCELLANEOUS) ×2
BLADE CLIPPER SURG (BLADE) ×3 IMPLANT
BLADE SURG 11 STRL SS (BLADE) ×1 IMPLANT
CANISTER SUCT 3000ML PPV (MISCELLANEOUS) ×6 IMPLANT
CANNULA REDUC XI 12-8 STAPL (CANNULA) ×2
CANNULA REDUCER 12-8 DVNC XI (CANNULA) ×4 IMPLANT
CATH TROCAR 20FR (CATHETERS) IMPLANT
CHLORAPREP W/TINT 26 (MISCELLANEOUS) ×3 IMPLANT
CLIP VESOCCLUDE MED 6/CT (CLIP) IMPLANT
CNTNR URN SCR LID CUP LEK RST (MISCELLANEOUS) ×10 IMPLANT
CONN ST 1/4X3/8  BEN (MISCELLANEOUS)
CONN ST 1/4X3/8 BEN (MISCELLANEOUS) IMPLANT
CONT SPEC 4OZ STRL OR WHT (MISCELLANEOUS) ×30
DEFOGGER SCOPE WARMER CLEARIFY (MISCELLANEOUS) ×3 IMPLANT
DERMABOND ADVANCED (GAUZE/BANDAGES/DRESSINGS) ×1
DERMABOND ADVANCED .7 DNX12 (GAUZE/BANDAGES/DRESSINGS) ×2 IMPLANT
DRAIN CHANNEL 28F RND 3/8 FF (WOUND CARE) IMPLANT
DRAIN CHANNEL 32F RND 10.7 FF (WOUND CARE) IMPLANT
DRAPE ARM DVNC X/XI (DISPOSABLE) ×8 IMPLANT
DRAPE COLUMN DVNC XI (DISPOSABLE) ×2 IMPLANT
DRAPE CV SPLIT W-CLR ANES SCRN (DRAPES) ×3 IMPLANT
DRAPE DA VINCI XI ARM (DISPOSABLE) ×4
DRAPE DA VINCI XI COLUMN (DISPOSABLE) ×1
DRAPE HALF SHEET 40X57 (DRAPES) ×3 IMPLANT
DRAPE ORTHO SPLIT 77X108 STRL (DRAPES) ×3
DRAPE SURG ORHT 6 SPLT 77X108 (DRAPES) ×2 IMPLANT
ELECT BLADE 6.5 EXT (BLADE) IMPLANT
ELECT REM PT RETURN 9FT ADLT (ELECTROSURGICAL) ×3
ELECTRODE REM PT RTRN 9FT ADLT (ELECTROSURGICAL) ×2 IMPLANT
GAUZE KITTNER 4X5 RF (MISCELLANEOUS) ×3 IMPLANT
GAUZE KITTNER 4X8 (MISCELLANEOUS) ×2 IMPLANT
GAUZE SPONGE 4X4 12PLY STRL (GAUZE/BANDAGES/DRESSINGS) ×3 IMPLANT
GAUZE XEROFORM 1X8 LF (GAUZE/BANDAGES/DRESSINGS) ×1 IMPLANT
GLOVE SURG ENC MOIS LTX SZ7.5 (GLOVE) ×6 IMPLANT
GOWN STRL REUS W/ TWL LRG LVL3 (GOWN DISPOSABLE) ×4 IMPLANT
GOWN STRL REUS W/ TWL XL LVL3 (GOWN DISPOSABLE) ×6 IMPLANT
GOWN STRL REUS W/TWL 2XL LVL3 (GOWN DISPOSABLE) ×3 IMPLANT
GOWN STRL REUS W/TWL LRG LVL3 (GOWN DISPOSABLE) ×6
GOWN STRL REUS W/TWL XL LVL3 (GOWN DISPOSABLE) ×9
HEMOSTAT SURGICEL 2X14 (HEMOSTASIS) ×7 IMPLANT
IRRIGATOR SUCT 8 DISP DVNC XI (IRRIGATION / IRRIGATOR) IMPLANT
IRRIGATOR SUCTION 8MM XI DISP (IRRIGATION / IRRIGATOR) ×1
KIT BASIN OR (CUSTOM PROCEDURE TRAY) ×3 IMPLANT
KIT SUCTION CATH 14FR (SUCTIONS) IMPLANT
KIT TURNOVER KIT B (KITS) ×3 IMPLANT
NDL HYPO 25GX1X1/2 BEV (NEEDLE) ×2 IMPLANT
NEEDLE 22X1 1/2 (OR ONLY) (NEEDLE) ×3 IMPLANT
NEEDLE HYPO 25GX1X1/2 BEV (NEEDLE) ×3 IMPLANT
NS IRRIG 1000ML POUR BTL (IV SOLUTION) ×9 IMPLANT
OBTURATOR OPTICAL STANDARD 8MM (TROCAR)
OBTURATOR OPTICAL STND 8 DVNC (TROCAR)
OBTURATOR OPTICALSTD 8 DVNC (TROCAR) IMPLANT
PACK CHEST (CUSTOM PROCEDURE TRAY) ×3 IMPLANT
PAD ARMBOARD 7.5X6 YLW CONV (MISCELLANEOUS) ×15 IMPLANT
PORT ACCESS TROCAR AIRSEAL 12 (TROCAR) ×2 IMPLANT
PORT ACCESS TROCAR AIRSEAL 5M (TROCAR) ×1
RELOAD STAPLE 45 2.5 WHT DVNC (STAPLE) IMPLANT
RELOAD STAPLE 45 3.5 BLU DVNC (STAPLE) IMPLANT
RELOAD STAPLE 45 4.3 GRN DVNC (STAPLE) IMPLANT
RELOAD STAPLE 45 4.6 BLK DVNC (STAPLE) IMPLANT
RELOAD STAPLER 2.5X45 WHT DVNC (STAPLE) ×6 IMPLANT
RELOAD STAPLER 3.5X45 BLU DVNC (STAPLE) ×12 IMPLANT
RELOAD STAPLER 4.3X45 GRN DVNC (STAPLE) ×20 IMPLANT
RELOAD STAPLER 45 4.6 BLK DVNC (STAPLE) ×24 IMPLANT
SEAL CANN UNIV 5-8 DVNC XI (MISCELLANEOUS) ×4 IMPLANT
SEAL XI 5MM-8MM UNIVERSAL (MISCELLANEOUS) ×2
SEALANT SURG COSEAL 4ML (VASCULAR PRODUCTS) IMPLANT
SEALANT SURG COSEAL 8ML (VASCULAR PRODUCTS) IMPLANT
SEALER SYNCHRO 8 IS4000 DV (MISCELLANEOUS) ×1
SEALER SYNCHRO 8 IS4000 DVNC (MISCELLANEOUS) IMPLANT
SET TRI-LUMEN FLTR TB AIRSEAL (TUBING) ×3 IMPLANT
SOLUTION ELECTROLUBE (MISCELLANEOUS) IMPLANT
SPONGE INTESTINAL PEANUT (DISPOSABLE) IMPLANT
STAPLER 45 DA VINCI SURE FORM (STAPLE) ×1
STAPLER 45 SUREFORM CVD (STAPLE) ×2
STAPLER 45 SUREFORM CVD DVNC (STAPLE) IMPLANT
STAPLER 45 SUREFORM DVNC (STAPLE) IMPLANT
STAPLER CANNULA SEAL DVNC XI (STAPLE) ×4 IMPLANT
STAPLER CANNULA SEAL XI (STAPLE) ×2
STAPLER RELOAD 2.5X45 WHITE (STAPLE) ×3
STAPLER RELOAD 2.5X45 WHT DVNC (STAPLE) ×6
STAPLER RELOAD 3.5X45 BLU DVNC (STAPLE) ×12
STAPLER RELOAD 3.5X45 BLUE (STAPLE) ×6
STAPLER RELOAD 4.3X45 GREEN (STAPLE) ×10
STAPLER RELOAD 4.3X45 GRN DVNC (STAPLE) ×20
STAPLER RELOAD 45 4.6 BLK (STAPLE) ×12
STAPLER RELOAD 45 4.6 BLK DVNC (STAPLE) ×24
STOPCOCK 4 WAY LG BORE MALE ST (IV SETS) ×3 IMPLANT
SUT MON AB 2-0 CT1 36 (SUTURE) IMPLANT
SUT PDS AB 1 CTX 36 (SUTURE) IMPLANT
SUT PROLENE 4 0 RB 1 (SUTURE)
SUT PROLENE 4-0 RB1 .5 CRCL 36 (SUTURE) IMPLANT
SUT SILK  1 MH (SUTURE) ×1
SUT SILK 1 MH (SUTURE) ×2 IMPLANT
SUT SILK 1 TIES 10X30 (SUTURE) IMPLANT
SUT SILK 2 0 SH (SUTURE) IMPLANT
SUT SILK 2 0SH CR/8 30 (SUTURE) IMPLANT
SUT VIC AB 1 CTX 36 (SUTURE)
SUT VIC AB 1 CTX36XBRD ANBCTR (SUTURE) IMPLANT
SUT VIC AB 2-0 CT1 27 (SUTURE) ×3
SUT VIC AB 2-0 CT1 TAPERPNT 27 (SUTURE) ×2 IMPLANT
SUT VIC AB 3-0 SH 27 (SUTURE) ×9
SUT VIC AB 3-0 SH 27X BRD (SUTURE) ×6 IMPLANT
SUT VICRYL 0 TIES 12 18 (SUTURE) ×3 IMPLANT
SUT VICRYL 0 UR6 27IN ABS (SUTURE) ×6 IMPLANT
SUT VICRYL 2 TP 1 (SUTURE) IMPLANT
SYR 10ML LL (SYRINGE) ×3 IMPLANT
SYR 20ML LL LF (SYRINGE) ×3 IMPLANT
SYR 50ML LL SCALE MARK (SYRINGE) ×3 IMPLANT
SYSTEM RETRIEVAL ANCHOR 15 (MISCELLANEOUS) ×1 IMPLANT
SYSTEM RETRIEVAL ANCHOR 8 (MISCELLANEOUS) ×1 IMPLANT
SYSTEM SAHARA CHEST DRAIN ATS (WOUND CARE) ×3 IMPLANT
TAPE CLOTH 4X10 WHT NS (GAUZE/BANDAGES/DRESSINGS) ×3 IMPLANT
TAPE CLOTH SURG 4X10 WHT LF (GAUZE/BANDAGES/DRESSINGS) ×1 IMPLANT
TIP APPLICATOR SPRAY EXTEND 16 (VASCULAR PRODUCTS) IMPLANT
TOWEL GREEN STERILE (TOWEL DISPOSABLE) ×3 IMPLANT
TRAY FOLEY MTR SLVR 16FR STAT (SET/KITS/TRAYS/PACK) ×3 IMPLANT
TROCAR BLADELESS 15MM (ENDOMECHANICALS) IMPLANT
TUBING EXTENTION W/L.L. (IV SETS) ×3 IMPLANT
WATER STERILE IRR 1000ML POUR (IV SOLUTION) ×3 IMPLANT

## 2021-03-03 NOTE — Discharge Instructions (Addendum)
° °  Flexible Bronchoscopy, Care After This sheet gives you information about how to care for yourself after your test. Your doctor may also give you more specific instructions. If you have problems or questions, contact your doctor. Follow these instructions at home: Eating and drinking Do not eat or drink anything (not even water) for 2 hours after your test, or until your numbing medicine (local anesthetic) wears off. When your numbness is gone and your cough and gag reflexes have come back, you may: Eat only soft foods. Slowly drink liquids. The day after the test, go back to your normal diet. Driving Do not drive for 24 hours if you were given a medicine to help you relax (sedative). Do not drive or use heavy machinery while taking prescription pain medicine. General instructions  Take over-the-counter and prescription medicines only as told by your doctor. Return to your normal activities as told. Ask what activities are safe for you. Do not use any products that have nicotine or tobacco in them. This includes cigarettes and e-cigarettes. If you need help quitting, ask your doctor. Keep all follow-up visits as told by your doctor. This is important. It is very important if you had a tissue sample (biopsy) taken. Get help right away if: You have shortness of breath that gets worse. You get light-headed. You feel like you are going to pass out (faint). You have chest pain. You cough up: More than a little blood. More blood than before. Summary Do not eat or drink anything (not even water) for 2 hours after your test, or until your numbing medicine wears off. Do not use cigarettes. Do not use e-cigarettes. Get help right away if you have chest pain.  This information is not intended to replace advice given to you by your health care provider. Make sure you discuss any questions you have with your health care provider. Document Released: 12/12/2008 Document Revised: 01/27/2017  Document Reviewed: 03/04/2016 Elsevier Patient Education  2020 [employer].

## 2021-03-03 NOTE — TOC Progression Note (Signed)
Transition of Care [facility]) - Progression Note    Patient Details  Name: Richard Parrish MRN: [id number] Date of Birth: 01-10-1951  [facility]) CM/SW Contact  Zenon Mayo, RN Phone Number: 03/03/2021, 8:47 PM  Clinical Narrative:    from home with wife, s/p robotice lobectomy, chest tube to water seal.  TOC will continue to follow for dc needs.         Expected Discharge Plan and Services                                                 Social Determinants of Health (SDOH) Interventions    Readmission Risk Interventions No flowsheet data found.

## 2021-03-03 NOTE — Anesthesia Procedure Notes (Signed)
Procedure Name: Intubation Date/Time: 03/03/2021 2:04 PM Performed by: Georgia Duff, CRNA Pre-anesthesia Checklist: Patient identified, Emergency Drugs available, Suction available and Patient being monitored Patient Re-evaluated:Patient Re-evaluated prior to induction Oxygen Delivery Method: Circle System Utilized Preoxygenation: Pre-oxygenation with 100% oxygen Induction Type: IV induction Ventilation: Mask ventilation without difficulty Laryngoscope Size: Miller and 3 Grade View: Grade I Tube type: Oral Tube size: 8.5 mm Number of attempts: 1 Airway Equipment and Method: Stylet and Oral airway Placement Confirmation: ETT inserted through vocal cords under direct vision, positive ETCO2 and breath sounds checked- equal and bilateral Secured at: 22 cm Tube secured with: Tape Dental Injury: Teeth and Oropharynx as per pre-operative assessment

## 2021-03-03 NOTE — Anesthesia Procedure Notes (Signed)
Arterial Line Insertion Start/End1/05/2021 3:45 PM, 03/03/2021 3:50 PM Performed by: Roderic Palau, MD, CRNA  Left, radial was placed Catheter size: 20 G  Attempts: 1 Procedure performed without using ultrasound guided technique. Following insertion, Biopatch and dressing applied. Post procedure assessment: unchanged  Patient tolerated the procedure well with no immediate complications.

## 2021-03-03 NOTE — Transfer of Care (Signed)
Immediate Anesthesia Transfer of Care Note  Patient: Richard Parrish  Procedure(s) Performed: XI ROBOTIC ASSISTED THORASCOPY-RIGHT UPPER LOBE WEDGE RESECTION, RIGHT MIDDLE LOBE WEDGE RESECTION, RIGHT UPPER LOBECTOMY (Right: Chest) INTERCOSTAL NERVE BLOCK LYMPH NODE DISSECTION LYMPH NODE BIOPSY  Patient Location: PACU  Anesthesia Type:General  Level of Consciousness: drowsy  Airway & Oxygen Therapy: Patient Spontanous Breathing and Patient connected to face mask oxygen  Post-op Assessment: Report given to RN and Post -op Vital signs reviewed and stable  Post vital signs: Reviewed and stable  Last Vitals:  Vitals Value Taken Time  BP 109/73 03/03/21 1935  Temp 36 C 03/03/21 1935  Pulse 58 03/03/21 1941  Resp 20 03/03/21 1942  SpO2 100 % 03/03/21 1941  Vitals shown include unvalidated device data.  Last Pain:  Vitals:   03/03/21 1116  TempSrc:   PainSc: 0-No pain         Complications: No notable events documented.

## 2021-03-03 NOTE — Interval H&P Note (Signed)
History and Physical Interval Note:  03/03/2021 1:24 PM  Richard Parrish  has presented today for surgery, with the diagnosis of lung nodule.  The various methods of treatment have been discussed with the patient and family. After consideration of risks, benefits and other options for treatment, the patient has consented to  Procedure(s): ROBOTIC ASSISTED NAVIGATIONAL BRONCHOSCOPY (N/A) as a surgical intervention.  The patient's history has been reviewed, patient examined, no change in status, stable for surgery.  I have reviewed the patient's chart and labs.  Questions were answered to the patient's satisfaction.     [place]

## 2021-03-03 NOTE — Anesthesia Procedure Notes (Signed)
Procedure Name: Intubation Date/Time: 03/03/2021 2:59 PM Performed by: Georgia Duff, CRNA Pre-anesthesia Checklist: Patient identified, Emergency Drugs available, Suction available and Patient being monitored Patient Re-evaluated:Patient Re-evaluated prior to induction Oxygen Delivery Method: Circle System Utilized Preoxygenation: Pre-oxygenation with 100% oxygen Induction Type: IV induction Ventilation: Mask ventilation without difficulty Laryngoscope Size: Glidescope and 4 Grade View: Grade I Tube type: Oral Endobronchial tube: EBT position confirmed by fiberoptic bronchoscope and Double lumen EBT and 39 Fr Number of attempts: 1 Airway Equipment and Method: Stylet and Oral airway Placement Confirmation: ETT inserted through vocal cords under direct vision, positive ETCO2 and breath sounds checked- equal and bilateral Tube secured with: Tape Dental Injury: Teeth and Oropharynx as per pre-operative assessment  Comments: Under direct visualization with glidescope ETT removed and repalced with DLETT with position confirmed via bronchoscope

## 2021-03-03 NOTE — Brief Op Note (Signed)
03/03/2021  7:06 PM  PATIENT:  Richard Parrish  71 y.o. male  PRE-OPERATIVE DIAGNOSIS:  interstitial lung disease PULMONARY NODULE  POST-OPERATIVE DIAGNOSIS:  interstitial lung disease PULMONARY NODULE  PROCEDURE:  Procedure(s):  XI ROBOTIC ASSISTED THORASCOPY- -Right upper lobe wedge resection,  -Completion Right upper lobectomy -Wedge Resection RLL  INTERCOSTAL NERVE BLOCK  LYMPH NODE DISSECTION  SURGEON:  Surgeon(s) and Role:    * Lightfoot, Lucile Crater, MD - Primary  PHYSICIAN ASSISTANT: Clio Gerhart PA-C  ASSISTANTS: none   ANESTHESIA:   general  EBL:  150 mL   BLOOD ADMINISTERED:none  DRAINS:  28 Straight chest tube    LOCAL MEDICATIONS USED:  BUPIVICAINE   SPECIMEN:  Source of Specimen:  right upper lobe wedge, right upper lobe, right lower lobe wedge, lymph nodes  DISPOSITION OF SPECIMEN:  PATHOLOGY  COUNTS:  YES  TOURNIQUET:  * No tourniquets in log *  DICTATION: .Dragon Dictation  PLAN OF CARE: Admit to inpatient   PATIENT DISPOSITION:  PACU - hemodynamically stable.   Delay start of Pharmacological VTE agent (>24hrs) due to surgical blood loss or risk of bleeding: no

## 2021-03-03 NOTE — Anesthesia Postprocedure Evaluation (Signed)
Anesthesia Post Note  Patient: Richard Parrish  Procedure(s) Performed: XI ROBOTIC ASSISTED THORASCOPY-RIGHT UPPER LOBE WEDGE RESECTION, RIGHT MIDDLE LOBE WEDGE RESECTION, RIGHT UPPER LOBECTOMY (Right: Chest) INTERCOSTAL NERVE BLOCK LYMPH NODE DISSECTION LYMPH NODE BIOPSY     Patient location during evaluation: PACU Anesthesia Type: General Level of consciousness: awake and alert Pain management: pain level controlled Vital Signs Assessment: post-procedure vital signs reviewed and stable Respiratory status: spontaneous breathing, nonlabored ventilation, respiratory function stable and patient connected to nasal cannula oxygen Cardiovascular status: blood pressure returned to baseline and stable Postop Assessment: no apparent nausea or vomiting Anesthetic complications: no   No notable events documented.  Last Vitals:  Vitals:   03/03/21 2016 03/03/21 2030  BP: 114/64 113/70  Pulse: 66 61  Resp: 18 11  Temp:  (!) 36.2 C  SpO2: 100% 100%    Last Pain:  Vitals:   03/03/21 2030  TempSrc:   PainSc: [place] Aunisty Reali

## 2021-03-03 NOTE — Interval H&P Note (Signed)
History and Physical Interval Note:  03/03/2021 11:00 AM  Richard Parrish  has presented today for surgery, with the diagnosis of ILD PULMONARY NODULE.  The various methods of treatment have been discussed with the patient and family. After consideration of risks, benefits and other options for treatment, the patient has consented to  Procedure(s): XI ROBOTIC ASSISTED THORASCOPY-RIGHT UPPER LOBE WEDGE RESECTION, possible lobectomy, RML & RLL WEDGE (Right) as a surgical intervention.  The patient's history has been reviewed, patient examined, no change in status, stable for surgery.  I have reviewed the patient's chart and labs.  Questions were answered to the patient's satisfaction.     [name]

## 2021-03-03 NOTE — H&P (Signed)
Synopsis: Referred in January 2023 for lung nodule by No ref. provider found  Subjective:   PATIENT ID: Richard Parrish GENDER: male DOB: 1950/10/26, MRN: [id number]    This is a 71 year old gentleman, seen in consultation for a lung nodule concerning for malignancy.  Patient has interstitial lung disease, history of MI, OSA on CPAP, coronary artery disease.  Patient was seen in consultation by thoracic surgery for consideration of lobectomy regarding a PET avid lung nodule.She is followed by one of my partners in the interstitial lung disease clinic.  Patient was found to have a 2 cm right upper lobe pulmonary nodule.  Initially was diagnosed with ILD in 1997.  Overall has been stable no previous lung biopsies.  After further discussion with patient and surgical consultation recommendation for a combined case, robotic assisted bronchoscopy with fiducial dye marking followed by wedge resection/lobectomy.  His pulmonary function testing was acceptable for lobectomy.   Past Medical History:  Diagnosis Date   Anxiety    Arthritis    "lower back" (04/07/2017)   BPH (benign prostatic hypertrophy)    C. difficile diarrhea    CAD (coronary artery disease)    Chronic lower back pain    Coronary artery disease 1989   cabg with dvg to rca    Depression    GERD (gastroesophageal reflux disease)    Hyperlipidemia    Hypertension    Insomnia    Leg pain    Myocardial infarction ([place]) 2002   OSA on CPAP    cpap setting of 60 per pt   Osteoarthritis    "hips" (04/07/2017)   PAD (peripheral artery disease) ([place])    Peripheral vascular disease ([place])    Pneumonia 1989   S/P CABG   Pulmonary fibrosis ([place])    "one time" (04/07/2017)   Spinal stenosis      Family History  Problem Relation Age of Onset   Heart disease Mother    Cancer Father      Past Surgical History:  Procedure Laterality Date   ABDOMINAL AORTAGRAM N/A 03/15/2011   Procedure: ABDOMINAL Maxcine Ham;  Surgeon: Serafina Mitchell, MD;  Location: [facility] CATH LAB;  Service: Cardiovascular;  Laterality: N/A;   ABDOMINAL AORTAGRAM N/A 04/12/2011   Procedure: ABDOMINAL Maxcine Ham;  Surgeon: Serafina Mitchell, MD;  Location: [facility] CATH LAB;  Service: Cardiovascular;  Laterality: N/A;   BACK SURGERY     CARDIAC CATHETERIZATION  08/1987   CORONARY ANGIOPLASTY WITH STENT PLACEMENT  ~ 2002   [facility], [place]   CORONARY ARTERY BYPASS GRAFT  09/1987   "CABG X1";  [facility]; [place]; "RCA"   JOINT REPLACEMENT     KNEE ARTHROSCOPY Left    LAPAROSCOPIC CHOLECYSTECTOMY     PERIPHERAL VASCULAR INTERVENTION Left 2016   "put a stent; right branch of left femoral"   POSTERIOR LUMBAR FUSION  2009   lumbar fusion, S L 5 to L 4   RIGHT/LEFT HEART CATH AND CORONARY/GRAFT ANGIOGRAPHY N/A 10/05/2020   Procedure: RIGHT/LEFT HEART CATH AND CORONARY/GRAFT ANGIOGRAPHY;  Surgeon: Nelva Bush, MD;  Location: [place] CV LAB;  Service: Cardiovascular;  Laterality: N/A;   SHOULDER ARTHROSCOPY W/ ROTATOR CUFF REPAIR Right 2006   SHOULDER ARTHROSCOPY WITH SUBACROMIAL DECOMPRESSION AND OPEN ROTATOR C Left ~ [place]     TOTAL HIP ARTHROPLASTY Right 2012   TOTAL HIP ARTHROPLASTY Left 04/07/2017   TOTAL HIP ARTHROPLASTY Left 04/07/2017   Procedure: LEFT TOTAL HIP  ARTHROPLASTY;  Surgeon: Earlie Server, MD;  Location: [place];  Service: Orthopedics;  Laterality: Left;    Social History   Socioeconomic History   Marital status: Married    Spouse name: Not on file   Number of children: Not on file   Years of education: Not on file   Highest education level: Not on file  Occupational History   Occupation: Retired  Tobacco Use   Smoking status: Former    Packs/day: 2.00    Years: 15.00    Pack years: 30.00    Types: Cigarettes    Quit date: 03/01/1987    Years since quitting: 34.0   Smokeless tobacco: Never   Tobacco comments:    03/02/21-Pt instructed to not vape or drink alcohol for 24 hours  prior to surgery.   Vaping Use   Vaping Use: Every day   Start date: 12/29/2020   Devices: Lost[place]  Substance and Sexual Activity   Alcohol use: Yes    Comment: couple drinks/month   Drug use: No   Sexual activity: Not Currently  Other Topics Concern   Not on file  Social History Narrative   ** Merged History Encounter **       Social Determinants of Health   Financial Resource Strain: Not on file  Food Insecurity: Not on file  Transportation Needs: Not on file  Physical Activity: Not on file  Stress: Not on file  Social Connections: Not on file  Intimate Partner Violence: Not on file     Allergies  Allergen Reactions   Ambien [Zolpidem Tartrate] Other (See Comments)    Bad dreams/Vivid Dreams     @ENCMEDSTART @  ROS   Objective:  Physical Exam   Vitals:   03/03/21 1109  BP: (!) 177/81  Pulse: 65  Resp: 17  Temp: 98.5 F (36.9 C)  TempSrc: Oral  SpO2: 99%  Weight: 98.4 kg  Height: 6' (1.829 m)   99% on RA BMI Readings from Last 3 Encounters:  03/03/21 29.43 kg/m  03/02/21 29.57 kg/m  02/10/21 29.43 kg/m   Wt Readings from Last 3 Encounters:  03/03/21 98.4 kg  03/02/21 98.9 kg  02/10/21 98.4 kg     CBC    Component Value Date/Time   WBC 7.9 03/02/2021 1030   RBC 4.40 03/02/2021 1030   HGB 14.5 03/02/2021 1030   HCT 42.1 03/02/2021 1030   PLT 331 03/02/2021 1030   MCV 95.7 03/02/2021 1030   MCH 33.0 03/02/2021 1030   MCHC 34.4 03/02/2021 1030   RDW 12.4 03/02/2021 1030   LYMPHSABS 0.9 10/03/2020 1327   MONOABS 0.3 10/03/2020 1327   EOSABS 0.1 10/03/2020 1327   BASOSABS 0.0 10/03/2020 1327    Chest Imaging: CT chest super D: 02/25/2021: Stable 2.2 cm spiculated right upper lobe pulmonary nodule concerning for malignancy. The patient's images have been independently reviewed by me.    Pulmonary Functions Testing Results: PFT Results Latest Ref Rng & Units 02/11/2021 11/27/2020 09/05/2013  FVC-Pre L 3.83 3.98 4.02  FVC-Predicted  Pre % 81 84 80  FVC-Post L 3.94 4.03 4.21  FVC-Predicted Post % 83 85 84  Pre FEV1/FVC % % 71 71 71  Post FEV1/FCV % % 73 73 74  FEV1-Pre L 2.72 2.82 2.87  FEV1-Predicted Pre % 78 81 77  FEV1-Post L 2.88 2.95 3.12  DLCO uncorrected ml/min/mmHg 17.65 20.01 21.59  DLCO UNC% % 64 73 61  DLCO corrected ml/min/mmHg 17.65 20.24 -  DLCO COR %Predicted % 64  73 -  DLVA Predicted % 77 88 75  TLC L 5.99 6.09 6.80  TLC % Predicted % 80 81 91  RV % Predicted % 83 83 77    FeNO:   Pathology:   Echocardiogram:   Heart Catheterization:     Assessment & Plan:     ICD-10-CM   1. Interstitial lung disease ([place])  J84.9 Informed Consent Details: Physician/Practitioner Attestation; Transcribe to consent form and obtain patient signature    Initiate Pre-op Protocol    Diet NPO time specified    Pre-admission testing diagnosis    Verify informed consent    Verify: history and physical is on the chart    Verify: blood consent signed    Verify: type & screen is active for day of surgery or if previously ordered blood products prepared in blood bank    Patient education (specify): Use anesthesia standing orders to instruct patient on medications to take pre-op    Patient education (specify): Incentive Spirometry instructions to VATS patients    Lab instructions    Confirm: 12 lead EKG completed withing one month of surgery. If not current obtain per standing orders    Confirm: PA and Lateral CXR completed within previous 72 hours.  Films obtained on Friday are acceptable for Monday and Tuesday cases. If not current obtain per standing orders    Notify physician (specify)    Pneumatic SCD boots to accompany all patients to O.R.    Prep / Clip    ceFAZolin (ANCEF) IVPB 2g/100 mL premix    Informed Consent Details: Physician/Practitioner Attestation; Transcribe to consent form and obtain patient signature    Informed Consent Details: Physician/Practitioner Attestation; Transcribe to consent form and  obtain patient signature    Initiate Pre-op Protocol    Pre-admission testing diagnosis    Verify informed consent    Verify: history and physical is on the chart    Verify: blood consent signed    Verify: type & screen is active for day of surgery or if previously ordered blood products prepared in blood bank    Patient education (specify): Use anesthesia standing orders to instruct patient on medications to take pre-op    Patient education (specify): Incentive Spirometry instructions to VATS patients    Lab instructions    Confirm: 12 lead EKG completed withing one month of surgery. If not current obtain per standing orders    Confirm: PA and Lateral CXR completed within previous 72 hours.  Films obtained on Friday are acceptable for Monday and Tuesday cases. If not current obtain per standing orders    Notify physician (specify)    Pneumatic SCD boots to accompany all patients to O.R.    Prep / Clip    Informed Consent Details: Physician/Practitioner Attestation; Transcribe to consent form and obtain patient signature    2. Lung nodule  R91.1 Informed Consent Details: Physician/Practitioner Attestation; Transcribe to consent form and obtain patient signature    Initiate Pre-op Protocol    Diet NPO time specified    Pre-admission testing diagnosis    Verify informed consent    Verify: history and physical is on the chart    Verify: blood consent signed    Verify: type & screen is active for day of surgery or if previously ordered blood products prepared in blood bank    Patient education (specify): Use anesthesia standing orders to instruct patient on medications to take pre-op    Patient education (specify): Incentive Spirometry instructions to VATS  patients    Lab instructions    Confirm: 12 lead EKG completed withing one month of surgery. If not current obtain per standing orders    Confirm: PA and Lateral CXR completed within previous 72 hours.  Films obtained on Friday are  acceptable for Monday and Tuesday cases. If not current obtain per standing orders    Notify physician (specify)    Pneumatic SCD boots to accompany all patients to O.R.    Prep / Clip    ceFAZolin (ANCEF) IVPB 2g/100 mL premix    Informed Consent Details: Physician/Practitioner Attestation; Transcribe to consent form and obtain patient signature    Informed Consent Details: Physician/Practitioner Attestation; Transcribe to consent form and obtain patient signature    Initiate Pre-op Protocol    Pre-admission testing diagnosis    Verify informed consent    Verify: history and physical is on the chart    Verify: blood consent signed    Verify: type & screen is active for day of surgery or if previously ordered blood products prepared in blood bank    Patient education (specify): Use anesthesia standing orders to instruct patient on medications to take pre-op    Patient education (specify): Incentive Spirometry instructions to VATS patients    Lab instructions    Confirm: 12 lead EKG completed withing one month of surgery. If not current obtain per standing orders    Confirm: PA and Lateral CXR completed within previous 72 hours.  Films obtained on Friday are acceptable for Monday and Tuesday cases. If not current obtain per standing orders    Notify physician (specify)    Pneumatic SCD boots to accompany all patients to O.R.    Prep / Clip    Informed Consent Details: Physician/Practitioner Attestation; Transcribe to consent form and obtain patient signature    3. Surgery, elective  Z41.9 DG C-ARM BRONCHOSCOPY    DG C-ARM BRONCHOSCOPY      Discussion:  Today prior to the procedure we discussed his CT imaging concerning for a 2.2 cm right upper lobe spiculated lesion concerning for malignancy.  We talked about his planned procedure to include navigational bronchoscopy with robotic assistance followed by surgery with Dr. Kipp Brood.  No barriers at this time to proceed. We discussed the  risk benefits and alternatives.  We talked about the risk of bleeding and pneumothorax associated with bronchoscopy however the patient will be moved directly from our location to the operating room afterwards. We talked about this in detail.  Patient is agreeable to proceed and has freely signed consent.  We appreciate consultation.   Current Facility-Administered Medications:    acetaminophen (TYLENOL) tablet 1,000 mg, 1,000 mg, Oral, Once, Roderic Palau, MD   ceFAZolin (ANCEF) 2-4 GM/100ML-% IVPB, , , ,    ceFAZolin (ANCEF) IVPB 2g/100 mL premix, 2 g, Intravenous, 30 min Pre-Op, [place], Lucile Crater, MD   lactated ringers infusion, , Intravenous, Continuous, Belinda Block, MD, Last Rate: 10 mL/hr at 03/03/21 1132, New Bag at 03/03/21 1132   lactated ringers infusion, , Intravenous, Continuous, Oleta Mouse, MD, Last Rate: 10 mL/hr at 03/03/21 1137, New Bag at 03/03/21 1137   Garner Nash, DO [facility] 03/03/2021 1:20 PM

## 2021-03-03 NOTE — Op Note (Addendum)
Video Bronchoscopy with Robotic Assisted Bronchoscopic Navigation   Date of Operation: 03/03/2021   Pre-op Diagnosis: Lung nodule  Post-op Diagnosis: Lung nodule  Surgeon: Garner Nash, DO   Assistants: None   Anesthesia: General endotracheal anesthesia  Operation: Flexible video fiberoptic bronchoscopy with robotic assistance and biopsies.  Estimated Blood Loss: Minimal  Complications: None  Indications and History: Richard Parrish is a 71 y.o. male with history of lung nodule. The risks, benefits, complications, treatment options and expected outcomes were discussed with the patient.  The possibilities of pneumothorax, pneumonia, reaction to medication, pulmonary aspiration, perforation of a viscus, bleeding, failure to diagnose a condition and creating a complication requiring transfusion or operation were discussed with the patient who freely signed the consent.    Description of Procedure: The patient was seen in the Preoperative Area, was examined and was deemed appropriate to proceed.  The patient was taken to [facility] endoscopy room 3, identified as Richard Parrish and the procedure verified as Flexible Video Fiberoptic Bronchoscopy.  A Time Out was held and the above information confirmed.   Prior to the date of the procedure a high-resolution CT scan of the chest was performed. Utilizing ION software program a virtual tracheobronchial tree was generated to allow the creation of distinct navigation pathways to the patient's parenchymal abnormalities. After being taken to the operating room general anesthesia was initiated and the patient  was orally intubated. The video fiberoptic bronchoscope was introduced via the endotracheal tube and a general inspection was performed which showed normal right and left lung anatomy, aspiration of the bilateral mainstems was completed to remove any remaining secretions. Robotic catheter inserted into patient's endotracheal tube.   Target #1 right  upper lobe: The distinct navigation pathways prepared prior to this procedure were then utilized to navigate to patient's lesion identified on CT scan. The robotic catheter was secured into place and the vision probe was withdrawn.  Lesion location was approximated using fluoroscopy, and three-dimensional cone beam CT imaging, radial endobronchial ultrasound for peripheral targeting. Under fluoroscopic guidance transbronchial needle brushings, transbronchial needle biopsies, and transbronchial forceps biopsies were performed to be sent for cytology and pathology.  Following tissue sampling patient underwent fiducial dye marking with a 50% / 50% mixture of methylene blue and ICG.  Under fluoroscopic guidance approximately 1.5 cc of dye was injected into the lesion.  At the end of the procedure a general airway inspection was performed and there was no evidence of active bleeding. The bronchoscope was removed.  The patient tolerated the procedure well. There was no significant blood loss and there were no obvious complications. A post-procedural chest x-ray is pending.  Samples Target #1: 1. Transbronchial needle brushings from RUL 2. Transbronchial Wang needle biopsies from RUL 3. Transbronchial forceps biopsies from RUL  Plans:  Patient was transferred under the care of anesthesia and Dr. Kipp Brood operating room 10.  Garner Nash, DO [facility] 03/03/2021 2:47 PM

## 2021-03-03 NOTE — Op Note (Signed)
[address]       [place] [address]             [phone]        03/03/2021  Patient:  Richard Parrish Pre-Op Dx: Interstitial lung disease   Right upper lobe pulmonary nodule Post-op Dx: Interstitial lung disease   Non-small cell lung cancer Procedure: - Robotic assisted right video thoracoscopy -Right upper lobectomy -Right upper lobe wedge resection -Right lower lobe wedge resection - Mediastinal lymph node sampling - Intercostal nerve block  Surgeon and Role:      * Richard Parrish, Richard Crater, MD - Primary  Assistant: Richard Pol, PA-C  An experienced assistant was required given the complexity of this surgery and the standard of surgical care. The assistant was needed for exposure, dissection, suctioning, retraction of delicate tissues and sutures, instrument exchange and for overall help during this procedure.    Anesthesia  general EBL: 100 ml Blood Administration: None Specimen: Right lower lobe wedge resection.  Right upper lobe wedge resection.  Right upper lobe.  Level 4 and 9 lymph nodes.  Hilar lymph nodes.  Drains: 39 F argyle chest tube in right chest Counts: correct   Indications: 71 year old male with history of pulmonary fibrosis male with a 2 cm right upper lobe pulmonary nodule.  His pulmonary function testings are acceptable for lobectomy.  He does have a history of interstitial lung disease which appears to be stable.  I would like to obtain new pulmonary function testing since his most recent one was 60 months old.  He states that his respiratory status has not changed much since that time.  I will also refer him to Dr. Valeta Parrish for evaluation of a navigational bronchoscopy in combination with our lung resection.  He will need to be on a Plavix washout prior to this procedure.  We will plan for combination procedure with biopsy of the nodule via navigational bronchoscopy, and if this is positive for primary lung cancer then he will undergo  a right upper lobectomy.  Dr. Vaughan Parrish is also requested that we perform wedge resections of the middle and lower lobe for further delineation of his pulmonary fibrosis.    Although his pulmonary function tests are good, I did explain possibility of having an exacerbation of his pulmonary fibrosis with manipulation of the lung.  Also on review of his imaging his upper lobe is the segment that is least scarred thus despite having normal pulmonary function testing there is a possibility that he may require supplemental oxygen if a lobectomy is required.  Both he and his wife are understanding of this and are in agreement with proceeding.  Findings: A wedge resection of the upper lobe was performed.  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  We then proceeded with a lobectomy.  He had an incomplete fissure, and a very large recurrent pulmonary artery branch.  We were able to perform a lobectomy safely.    He did have changes consistent with interstitial lung disease.  This was most prominent in the lower lobe.  A wedge resection was performed of this.  Operative Technique: After the risks, benefits and alternatives were thoroughly discussed, the patient was brought to the operative theatre.  Anesthesia was induced, and the patient was then placed in a left lateral decubitus position and was prepped and draped in normal sterile fashion.  An appropriate surgical pause was performed, and pre-operative antibiotics were dosed accordingly.  We began by placing  our 4 robotic ports in the the 7th intercostal space targeting the hilum of the lung.  A 66mm assistant port was placed in the 9th intercostal space in the anterior axillary line.  The robot was then docked and all instruments were passed under direct visualization.    The firefly was activated.  The ICG was evident and the right upper lobe.  A generous wedge resection was performed to include this area along with some pleural tenting involving the  right upper lobe.  This was then passed into an Endo Catch bag and removed from the anterior port.  The pathology was consistent with non-small cell lung cancer.  The lung was then retracted superiorly, and the inferior pulmonary ligament was divided.  The hilum was mobilized anteriorly and posteriorly.  We identified the upper lobe vein, and after careful isolation, it was divided with a vascular stapler.  We next moved to the pulmonary artery.  The artery was then divided with a vascular load stapler.  There was a large recurrent branch that also had to be divided.  The bronchus to the upper lobe was then isolated.  After a test clamp, with good ventilation of the middle and lower lobes, the bronchus was then divided.  The fissure was completed, and the specimen was passed into an endocatch bag.  It was removed from the anterior access site.    Lymph nodes were then sampled at levels 4, 9 and the hilum.  The chest was irrigated, and an air leak test was performed.  An intercostal nerve block was performed under direct visualization.  A 28 F chest with then placed, and we watch the remaining lobes re-expand.  The skin and soft tissue were closed with absorbable suture    The patient tolerated the procedure without any immediate complications, and was transferred to the PACU in stable condition.  Richard Parrish

## 2021-03-04 ENCOUNTER — Inpatient Hospital Stay (HOSPITAL_COMMUNITY): Payer: Medicare Other

## 2021-03-04 ENCOUNTER — Encounter (HOSPITAL_COMMUNITY): Payer: Self-pay | Admitting: Thoracic Surgery (Cardiothoracic Vascular Surgery)

## 2021-03-04 LAB — BASIC METABOLIC PANEL
Anion gap: 7 (ref 5–15)
BUN: 19 mg/dL (ref 8–23)
CO2: 25 mmol/L (ref 22–32)
Calcium: 8.9 mg/dL (ref 8.9–10.3)
Chloride: 101 mmol/L (ref 98–111)
Creatinine, Ser: 0.86 mg/dL (ref 0.61–1.24)
GFR, Estimated: >60 mL/min (ref >60)
Glucose, Bld: 139 mg/dL — ABNORMAL HIGH (ref 70–99)
Potassium: 4.3 mmol/L (ref 3.5–5.1)
Sodium: 133 mmol/L — ABNORMAL LOW (ref 135–145)

## 2021-03-04 LAB — CBC
HCT: 40.8 % (ref 39.0–52.0)
Hemoglobin: 14.4 g/dL (ref 13.0–17.0)
MCH: 33.6 pg (ref 26.0–34.0)
MCHC: 35.3 g/dL (ref 30.0–36.0)
MCV: 95.1 fL (ref 80.0–100.0)
Platelets: 318 10*3/uL (ref 150–400)
RBC: 4.29 MIL/uL (ref 4.22–5.81)
RDW: 12.5 % (ref 11.5–15.5)
WBC: 14.2 10*3/uL — ABNORMAL HIGH (ref 4.0–10.5)
nRBC: 0 % (ref 0.0–0.2)

## 2021-03-04 LAB — GLUCOSE, CAPILLARY: Glucose-Capillary: 134 mg/dL — ABNORMAL HIGH (ref 70–99)

## 2021-03-04 MED ORDER — MORPHINE SULFATE (PF) 2 MG/ML IV SOLN
2.0000 mg | INTRAVENOUS | Status: DC | PRN
  Administered 2021-03-04 – 2021-03-05 (×4): 2 mg via INTRAVENOUS
  Filled 2021-03-04 (×4): qty 1

## 2021-03-04 MED ORDER — IPRATROPIUM-ALBUTEROL 0.5-2.5 (3) MG/3ML IN SOLN: 3.0000 mL | Freq: Four times a day (QID) | RESPIRATORY_TRACT | Status: DC | PRN

## 2021-03-04 MED ORDER — IPRATROPIUM-ALBUTEROL 0.5-2.5 (3) MG/3ML IN SOLN
3.0000 mL | Freq: Two times a day (BID) | RESPIRATORY_TRACT | Status: DC
  Administered 2021-03-04 – 2021-03-07 (×6): 3 mL via RESPIRATORY_TRACT
  Filled 2021-03-04 (×6): qty 3

## 2021-03-04 NOTE — Progress Notes (Addendum)
° °   °  [address]       [place] [address]             [phone]      1 Day Post-Op Procedure(s) (LRB): XI ROBOTIC ASSISTED THORASCOPY-RIGHT UPPER LOBE WEDGE RESECTION, RIGHT MIDDLE LOBE WEDGE RESECTION, RIGHT UPPER LOBECTOMY (Right) INTERCOSTAL NERVE BLOCK LYMPH NODE DISSECTION LYMPH NODE BIOPSY  Subjective:  Patient with pain this morning.  States when he takes a deep breath it shoots pain to his right shoulder.  I explained that is due to the chest tube.  Denies N/V  Objective: Vital signs in last 24 hours: Temp:  [96.8 F (36 C)-98.5 F (36.9 C)] 98.3 F (36.8 C) (01/05 0306) Pulse Rate:  [60-72] 72 (01/05 0403) Cardiac Rhythm: Heart block (01/04 2053) Resp:  [11-20] 17 (01/05 0403) BP: (109-177)/(61-90) 113/67 (01/05 0306) SpO2:  [99 %-100 %] 100 % (01/05 0403) Arterial Line BP: (118-122)/(52-59) 122/52 (01/04 2005) Weight:  [98.4 kg-102.6 kg] 102.6 kg (01/05 0403)  Intake/Output from previous day: 01/04 0701 - 01/05 0700 In: 1799.9 [I.V.:1600; IV Piggyback:199.9] Out: 1600 [Urine:900; Blood:150; Chest Tube:550]  General appearance: alert, cooperative, and no distress Heart: regular rate and rhythm Lungs: clear to auscultation bilaterally Abdomen: soft, non-tender; bowel sounds normal; no masses,  no organomegaly Extremities: extremities normal, atraumatic, no cyanosis or edema Wound: clean and dry  Lab Results: Recent Labs    03/02/21 1030 03/04/21 0040  WBC 7.9 14.2*  HGB 14.5 14.4  HCT 42.1 40.8  PLT 331 318   BMET:  Recent Labs    03/02/21 1030 03/04/21 0040  NA 135 133*  K 4.2 4.3  CL 104 101  CO2 22 25  GLUCOSE 108* 139*  BUN 23 19  CREATININE 0.81 0.86  CALCIUM 9.2 8.9    PT/INR:  Recent Labs    03/02/21 1030  LABPROT 12.5  INR 0.9   ABG    Component Value Date/Time   PHART 7.402 03/02/2021 1124   HCO3 23.7 03/02/2021 1124   TCO2 25 10/05/2020 1213   ACIDBASEDEF 0.5 03/02/2021 1124   O2SAT 97.6  03/02/2021 1124   CBG (last 3)  Recent Labs    03/03/21 2114 03/04/21 0312  GLUCAP 132* 134*    Assessment/Plan: S/P Procedure(s) (LRB): XI ROBOTIC ASSISTED THORASCOPY-RIGHT UPPER LOBE WEDGE RESECTION, RIGHT MIDDLE LOBE WEDGE RESECTION, RIGHT UPPER LOBECTOMY (Right) INTERCOSTAL NERVE BLOCK LYMPH NODE DISSECTION LYMPH NODE BIOPSY  CV- H/O CABG, NSR H/O HTN- Toprol XL at reduced dose, home Imdur ordered Pulm- CT with air leak, about 600 cc drainage since surgery, CXR free from pneumothorax, + atelectasis.Marland Kitchen leave tube in today Renal- creatinine is WNL, lytes okay Pain control- patient on chronic pain medications at home, continue home Norco, muscle relaxants, toradol prn, will add low dose Morphine prn for breakthrough pain Lovenox for DVT prophylaxis Dispo- patient stable, + air leak from chest tube leave on water seal today, pain control is an issue regimen has been adjusted, patient stable continue current care   LOS: 1 day    Ellwood Handler, PA-C 03/04/2021   Agree with above. Doing well Pulmonary toilet today.  Camren Lipsett Bary Leriche

## 2021-03-04 NOTE — Progress Notes (Signed)
Mobility Specialist Progress Note    03/04/21 1628  Mobility  Activity Ambulated in hall  Level of Assistance Contact guard assist, steadying assist  Assistive Device Front wheel walker  Distance Ambulated (ft) 140 ft  Mobility Ambulated with assistance in hallway  Mobility Response Tolerated fair  Mobility performed by Mobility specialist  $Mobility charge 1 Mobility   During Mobility: 68 HR Post-Mobility: 55 HR, 100% SpO2  Pt received in bed and agreeable. C/o right side pain and back spasms. Returned to sitting up in bed with wife present.   [facility] Mobility Specialist  M.S. Primary Phone: [phone] M.S. Secondary Phone: [phone]

## 2021-03-04 NOTE — Discharge Summary (Signed)
Physician Discharge Summary  Patient ID: Richard Parrish MRN: [id number] DOB/AGE: 72-17-1952 72 y.o.  Admit date: 03/03/2021 Discharge date: 03/07/2021  Admission Diagnoses:  Patient Active Problem List   Diagnosis Date Noted   Lung nodule 03/03/2021   Malnutrition of moderate degree 10/05/2020   Shortness of breath    Unstable angina ([place]) 10/03/2020   Chest pain    Hx of CABG    Disc degeneration, lumbar 02/10/2018   Aortic atherosclerosis ([place]) 11/13/2017   Pure hypercholesterolemia 11/13/2017   Degenerative joint disease of left hip 04/07/2017   Dog bite of face 08/08/2016   Right hip pain 06/01/2016   Achilles tendon pain 12/31/2015   Postinflammatory pulmonary fibrosis ([place]) 07/24/2013   Cough 07/24/2013   CAD (coronary artery disease), native coronary artery 04/01/2013   Essential hypertension, benign 12/12/2012    Class: Chronic   Other and unspecified hyperlipidemia 12/12/2012   Atherosclerosis of native arteries of the extremities with intermittent claudication 11/25/2011   Peripheral vascular disease, unspecified ([place]) 02/28/2011   Discharge Diagnoses:   Patient Active Problem List   Diagnosis Date Noted   Lung nodule 03/03/2021   S/P Robotic Assisted Right Video Assisted Thoracoscopy with Right Upper Lobectomy, Right Lower Lobe wedge resection 03/03/2021   Malnutrition of moderate degree 10/05/2020   Shortness of breath    Unstable angina ([place]) 10/03/2020   Chest pain    Hx of CABG    Disc degeneration, lumbar 02/10/2018   Aortic atherosclerosis ([place]) 11/13/2017   Pure hypercholesterolemia 11/13/2017   Degenerative joint disease of left hip 04/07/2017   Dog bite of face 08/08/2016   Right hip pain 06/01/2016   Achilles tendon pain 12/31/2015   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  The patient had complaints of shortness of breath.  The patient was found to have a right upper lobe lung nodule.  Further workup with PET CT scan showed the nodule to be hypermetabolic and concerning for malignancy.  The patient was evaluated by Dr. Kipp Parrish who felt patient would benefit from robotic assisted lobectomy.  The risks and benefits of the procedure were explained to the patient and he was agreeable to proceed.  Hospital Course:  The patient presented to [facility] on 03/04/2021.  The patient underwent Flexible video fiberoptic bronchoscopy with robotic assistance and biopsies.  He was then taken to the operating room and underwent Robotic Assisted Right Video Assisted Thoracoscopy with Right Upper Lobectomy, Right Lower lobe wedge resection, lymph node biopsies, and intercostal nerve block.  The patient tolerated the procedure without difficulty, was extubated, and taken to the SICU in stable condition.  The patient had issues with pain post operatively.  His chronic pain medications were resumed and he was treated with some additional IV medications.  The patient's chest tube showed an air leak on water seal.  CXR was free from pneumothorax.  The patient's chest tube output decreased.  His air leak resolved and his chest tube was removed on 03/05/2021. Follow  up chest x ray showed no pneumothorax, small right chest wall subcutaneous emphysema. He has been ambulating on room air. All wounds are clean, dry, and healing without signs of infection. He has been tolerating a diet and has had a bowel movement. His  pain was not well controlled, despite Norco and Skelaxin PRN. He was started on Gabapentin. His pain was under much better control. As discussed with Dr. Kipp Parrish, patient is stable for discharge home today.  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   Equivocal area of uptake about the RIGHT hilum is more focal and may correlate with a small RIGHT hilar lymph node. No contralateral adenopathy or mediastinal adenopathy with increased metabolic activity.   No signs of disease in the abdomen or pelvis.   Sinus disease of the LEFT sphenoid sinus   Aortic Atherosclerosis (ICD10-I70.0).     Electronically Signed   By: Zetta Bills M.D.   On: 01/06/2021 12:21  Treatments: surgery:   Video Bronchoscopy with Robotic Assisted Bronchoscopic Navigation    Date of Operation: 03/03/2021    Pre-op Diagnosis: Lung nodule   Post-op Diagnosis: Lung nodule   Surgeon: Garner Nash, DO    Assistants: None    Anesthesia: General endotracheal anesthesia   Operation: Flexible video fiberoptic bronchoscopy with robotic assistance and biopsies.   PATHOLOGY:Final result pending  Discharge Exam: Blood pressure (!) 146/73, pulse 82, temperature 98.2 F (36.8 C), temperature source Oral, resp. rate 18, height 6' (1.829 m), weight 102.6 kg, SpO2 97 %. Cardiovascular: RRR Pulmonary: Clear to auscultation bilaterally Abdomen: Soft, non tender, bowel sounds present. Extremities: No LE edema Wounds: Clean and dry.  No erythema or signs of infection.    Disposition: Discharge disposition: 01-Home or Self Care        Allergies as of 03/07/2021       Reactions   Ambien [zolpidem Tartrate] Other (See Comments)   Bad dreams/Vivid Dreams        Medication List     TAKE these medications    acetaminophen 500 MG  tablet Commonly known as: TYLENOL Take 500 mg by mouth every 6 (six) hours as needed (pain).   ALPRAZolam 0.5 MG tablet Commonly known as: XANAX Take 0.25-0.5 mg by mouth 3 (three) times daily as needed.   atorvastatin 80 MG tablet Commonly known as: LIPITOR TAKE 1 TABLET BY MOUTH EVERY DAY   budesonide 3 MG 24 hr capsule Commonly known as: ENTOCORT EC Take 3-6 mg by mouth See admin instructions. Takes 2 capsules (6 mg) by mouth in the morning and take 1 capsule (3 mg) by mouth at night   cholecalciferol 25 MCG (1000 UNIT) tablet Commonly known as: VITAMIN D3 Take 1,000 Units by mouth in the morning.   clopidogrel 75 MG tablet Commonly known as: PLAVIX TAKE 1 TABLET BY MOUTH EVERY DAY   gabapentin 100 MG capsule Commonly known as: NEURONTIN Take 2 capsules (200 mg total) by mouth 3 (three) times daily.   GLUCOSAMINE-MSM PO Take 1 tablet by mouth daily at 12 noon.   HYDROcodone-acetaminophen 7.5-325 MG tablet Commonly known as: NORCO Take 1 tablet by mouth 5 (five) times daily.   hyoscyamine 0.125 MG tablet Commonly known as: LEVSIN Take 0.125 mg by mouth every 4 (four) hours as needed for cramping.   isosorbide mononitrate 30 MG 24 hr tablet Commonly known as: IMDUR Take  1 tablet (30 mg total) by mouth daily.   metaxalone 800 MG tablet Commonly known as: SKELAXIN Take 800 mg by mouth 3 (three) times daily as needed for muscle spasms.   metoprolol succinate 50 MG 24 hr tablet Commonly known as: TOPROL-XL TAKE 1 TABLET BY MOUTH EVERY DAY AFTER A MEAL   nitroGLYCERIN 0.4 MG SL tablet Commonly known as: NITROSTAT Place 0.4 mg under the tongue every 5 (five) minutes as needed for chest pain.   pantoprazole 40 MG tablet Commonly known as: PROTONIX TAKE 1 TABLET BY MOUTH DAILY BEFORE BREAKFAST   PARoxetine 40 MG tablet Commonly known as: PAXIL Take 40 mg by mouth in the morning.   PROBIOTIC PO Take 1 capsule by mouth daily.   tamsulosin 0.4 MG Caps  capsule Commonly known as: FLOMAX Take 0.4 mg by mouth 2 (two) times daily.   valsartan 320 MG tablet Commonly known as: DIOVAN Take 0.5 tablets (160 mg total) by mouth daily.        Follow-up Information     Icard, Bradley L, DO. Schedule an appointment as soon as possible for a visit.   Specialty: Pulmonary Disease Contact information: [address] [place] [address] [phone]         Lajuana Matte, MD Follow up on 03/19/2021.   Specialty: Cardiothoracic Surgery Why: Appointment is at 10:20 Contact information: [place] [address] [phone]                 Signed: Arnoldo Lenis  03/07/2021, 10:17 AM

## 2021-03-04 NOTE — Hospital Course (Addendum)
History of Present Illness:  Mr. Strohmeier is a 71 yo male with history of interstitial lung disease, history of MI, OSA on CPAP, coronary artery disease.  The patient had complaints of shortness of breath.  The patient was found to have a right upper lobe lung nodule.  Further workup with PET CT scan showed the nodule to be hypermetabolic and concerning for malignancy.  The patient was evaluated by Dr. Kipp Brood who felt patient would benefit from robotic assisted lobectomy.  The risks and benefits of the procedure were explained to the patient and he was agreeable to proceed.  Hospital Course:  The patient presented to [facility] on 03/04/2021.  The patient underwent Flexible video fiberoptic bronchoscopy with robotic assistance and biopsies.  He was then taken to the operating room and underwent Robotic Assisted Right Video Assisted Thoracoscopy with Right Upper Lobectomy, Right Lower lobe wedge resection, lymph node biopsies, and intercostal nerve block.  The patient tolerated the procedure without difficulty, was extubated, and taken to the SICU in stable condition.  The patient had issues with pain post operatively.  His chronic pain medications were resumed and he was treated with some additional IV medications.  The patient's chest tube showed an air leak on water seal.  CXR was free from pneumothorax.  The patient's chest tube output decreased.  His air leak resolved and his chest tube was removed on 03/05/2021. Follow up chest x ray showed no pneumothorax, small right chest wall subcutaneous emphysema. He has been ambulating on room air. All wounds are clean, dry, and healing without signs of infection. He has been tolerating a diet and has had a bowel movement. His pain was not well controlled, despite Norco and Skelaxin PRN. He was started on Gabapentin.

## 2021-03-05 ENCOUNTER — Encounter (HOSPITAL_COMMUNITY): Payer: Self-pay | Admitting: Pulmonary Disease

## 2021-03-05 ENCOUNTER — Inpatient Hospital Stay (HOSPITAL_COMMUNITY): Payer: Medicare Other

## 2021-03-05 LAB — COMPREHENSIVE METABOLIC PANEL
ALT: 13 U/L (ref 0–44)
AST: 22 U/L (ref 15–41)
Albumin: 3.2 g/dL — ABNORMAL LOW (ref 3.5–5.0)
Alkaline Phosphatase: 55 U/L (ref 38–126)
Anion gap: 6 (ref 5–15)
BUN: 20 mg/dL (ref 8–23)
CO2: 26 mmol/L (ref 22–32)
Calcium: 8.4 mg/dL — ABNORMAL LOW (ref 8.9–10.3)
Chloride: 101 mmol/L (ref 98–111)
Creatinine, Ser: 0.98 mg/dL (ref 0.61–1.24)
GFR, Estimated: >60 mL/min (ref >60)
Glucose, Bld: 124 mg/dL — ABNORMAL HIGH (ref 70–99)
Potassium: 4.1 mmol/L (ref 3.5–5.1)
Sodium: 133 mmol/L — ABNORMAL LOW (ref 135–145)
Total Bilirubin: 0.7 mg/dL (ref 0.3–1.2)
Total Protein: 5.5 g/dL — ABNORMAL LOW (ref 6.5–8.1)

## 2021-03-05 LAB — CBC
HCT: 37.1 % — ABNORMAL LOW (ref 39.0–52.0)
Hemoglobin: 12.9 g/dL — ABNORMAL LOW (ref 13.0–17.0)
MCH: 33.8 pg (ref 26.0–34.0)
MCHC: 34.8 g/dL (ref 30.0–36.0)
MCV: 97.1 fL (ref 80.0–100.0)
Platelets: 294 10*3/uL (ref 150–400)
RBC: 3.82 MIL/uL — ABNORMAL LOW (ref 4.22–5.81)
RDW: 12.8 % (ref 11.5–15.5)
WBC: 11.1 10*3/uL — ABNORMAL HIGH (ref 4.0–10.5)
nRBC: 0 % (ref 0.0–0.2)

## 2021-03-05 LAB — CYTOLOGY - NON PAP

## 2021-03-05 NOTE — Progress Notes (Signed)
Complained of sudden on and off muscle spasm in his body that makes him moan on off. Given  med for anti spasm this am.

## 2021-03-05 NOTE — Care Management Important Message (Signed)
Important Message  Patient Details  Name: Richard Parrish MRN: [id number] Date of Birth: 1950/10/12   Medicare Important Message Given:  Yes     Kyndel Egger 03/05/2021, 3:00 PM

## 2021-03-05 NOTE — Progress Notes (Addendum)
° °   °  [address]       [place] [address]             [phone]       2 Days Post-Op Procedure(s) (LRB): XI ROBOTIC ASSISTED THORASCOPY-RIGHT UPPER LOBE WEDGE RESECTION, RIGHT MIDDLE LOBE WEDGE RESECTION, RIGHT UPPER LOBECTOMY (Right) INTERCOSTAL NERVE BLOCK LYMPH NODE DISSECTION LYMPH NODE BIOPSY  Subjective: Patient states pain under better control. He has not had much appetite-denies abdominal pain or nausea.  Objective: Vital signs in last 24 hours: Temp:  [97.6 F (36.4 C)-98.3 F (36.8 C)] 97.7 F (36.5 C) (01/06 0337) Pulse Rate:  [57-67] 60 (01/06 0337) Cardiac Rhythm: Normal sinus rhythm;Sinus bradycardia (01/06 0430) Resp:  [15-25] 15 (01/06 0337) BP: (110-145)/(67-71) 130/69 (01/06 0337) SpO2:  [95 %-100 %] 98 % (01/06 0337)      Intake/Output from previous day: 01/05 0701 - 01/06 0700 In: 480 [P.O.:480] Out: 1670 [Urine:1555; Chest Tube:115]   Physical Exam:  Cardiovascular: RRR Pulmonary: Coarse bilaterally Abdomen: Soft, non tender, bowel sounds present. Extremities: SCDs in place Wounds: Clean and dry.  No erythema or signs of infection. Chest Tube: to water seal; small,intermittent air leak with cough  Lab Results: CBC: Recent Labs    03/04/21 0040 03/05/21 0013  WBC 14.2* 11.1*  HGB 14.4 12.9*  HCT 40.8 37.1*  PLT 318 294   BMET:  Recent Labs    03/04/21 0040 03/05/21 0013  NA 133* 133*  K 4.3 4.1  CL 101 101  CO2 25 26  GLUCOSE 139* 124*  BUN 19 20  CREATININE 0.86 0.98  CALCIUM 8.9 8.4*    PT/INR:  Recent Labs    03/02/21 1030  LABPROT 12.5  INR 0.9   ABG:  INR: Will add last result for INR, ABG once components are confirmed Will add last 4 CBG results once components are confirmed  Assessment/Plan:  1. CV - SR, first degree heart block. On Imdur 30 mg daily and Toprol XL 25 mg daily.  2.  Pulmonary - On room air. Chest tube with 115 cc for 12 hours. Chest tube is to water seal and there is  a small, intermittent air leak with cough.  CXR this am appears stable (minor subcutaneous emphysema right lateral chest wall, no ptx). . Chest tube to remain. Check CXR in am. Encourage incentive spirometer. Await final pathology 3. Expected blood loss anemia-H and H this am decreased to 12.9 and 37.1 4. On Lovenox for DVT prophylaxis  Donielle M ZimmermanPA-C 03/05/2021,7:26 AM [phone]   Agree with above. No air leak for me on exam. Remove chest tube today.  Will recheck chest x-ray tomorrow.  Delphin Funes Bary Leriche

## 2021-03-05 NOTE — Progress Notes (Signed)
Mobility Specialist Progress Note    03/05/21 1020  Mobility  Activity Ambulated in hall  Level of Assistance Standby assist, set-up cues, supervision of patient - no hands on  Assistive Device Front wheel walker  Distance Ambulated (ft) 160 ft  Mobility Ambulated with assistance in hallway  Mobility Response Tolerated fair  Mobility performed by Mobility specialist  $Mobility charge 1 Mobility   Pre-Mobility: 61 HR Post-Mobility: 64 HR, 90% SpO2  Pt received in bed and agreeable. C/o back spasms during. Returned to sitting EOB with wife present in room.   [facility] Mobility Specialist  M.S. Primary Phone: [phone] M.S. Secondary Phone: [phone]

## 2021-03-05 NOTE — Progress Notes (Signed)
Mobility Specialist Progress Note    03/05/21 1631  Mobility  Activity Ambulated in hall  Level of Assistance Standby assist, set-up cues, supervision of patient - no hands on  Assistive Device Front wheel walker  Distance Ambulated (ft) 160 ft  Mobility Ambulated with assistance in hallway  Mobility Response Tolerated fair  Mobility performed by Mobility specialist  $Mobility charge 1 Mobility   Pt received in bed and agreeable. C/o back spasms during walk. Returned to sitting EOB with wife and NT present.   [facility] Mobility Specialist  M.S. 2C and 6E: [phone] M.S. 4E: [phone]

## 2021-03-06 ENCOUNTER — Inpatient Hospital Stay (HOSPITAL_COMMUNITY): Payer: Medicare Other

## 2021-03-06 MED ORDER — GABAPENTIN 100 MG PO CAPS
200.0000 mg | ORAL_CAPSULE | Freq: Three times a day (TID) | ORAL | Status: DC
  Administered 2021-03-06 – 2021-03-07 (×4): 200 mg via ORAL
  Filled 2021-03-06 (×4): qty 2

## 2021-03-06 NOTE — Plan of Care (Signed)

## 2021-03-06 NOTE — Progress Notes (Signed)
Mobility Specialist Progress Note:   03/06/21 1006  Mobility  Activity Ambulated in hall  Level of Assistance Standby assist, set-up cues, supervision of patient - no hands on  Assistive Device Front wheel walker  Distance Ambulated (ft) 120 ft  Mobility Ambulated with assistance in hallway  Mobility Response Tolerated well  Mobility performed by Mobility specialist  $Mobility charge 1 Mobility   Pt received in bed willing to participate in mobility. Complaints of back spasms and pain when breathing. Returned to EOB with call bell in reach , all needs met, and wife present.   [facility] Public librarian Phone [phone] Secondary Phone [phone]

## 2021-03-06 NOTE — Progress Notes (Addendum)
° °   °  [address]       [place] [address]             [phone]       3 Days Post-Op Procedure(s) (LRB): XI ROBOTIC ASSISTED THORASCOPY-RIGHT UPPER LOBE WEDGE RESECTION, RIGHT MIDDLE LOBE WEDGE RESECTION, RIGHT UPPER LOBECTOMY (Right) INTERCOSTAL NERVE BLOCK LYMPH NODE DISSECTION LYMPH NODE BIOPSY  Subjective: Patient states pain not under well control.  Objective: Vital signs in last 24 hours: Temp:  [97.6 F (36.4 C)-97.8 F (36.6 C)] 97.7 F (36.5 C) (01/07 0745) Pulse Rate:  [61-69] 63 (01/07 0745) Cardiac Rhythm: Normal sinus rhythm (01/07 0734) Resp:  [16-20] 18 (01/07 0745) BP: (117-159)/(66-85) 137/71 (01/07 0745) SpO2:  [96 %-100 %] 96 % (01/07 0850)      Intake/Output from previous day: 01/06 0701 - 01/07 0700 In: 500 [P.O.:500] Out: 375 [Urine:375]   Physical Exam:  Cardiovascular: RRR Pulmonary: Clear to auscultation bilaterally Abdomen: Soft, non tender, bowel sounds present. Extremities: No LE edema Wounds: Clean and dry.  No erythema or signs of infection.   Lab Results: CBC: Recent Labs    03/04/21 0040 03/05/21 0013  WBC 14.2* 11.1*  HGB 14.4 12.9*  HCT 40.8 37.1*  PLT 318 294    BMET:  Recent Labs    03/04/21 0040 03/05/21 0013  NA 133* 133*  K 4.3 4.1  CL 101 101  CO2 25 26  GLUCOSE 139* 124*  BUN 19 20  CREATININE 0.86 0.98  CALCIUM 8.9 8.4*     PT/INR:  No results for input(s): LABPROT, INR in the last 72 hours.  ABG:  INR: Will add last result for INR, ABG once components are confirmed Will add last 4 CBG results once components are confirmed  Assessment/Plan:  1. CV - SR, first degree heart block. On Imdur 30 mg daily and Toprol XL 25 mg daily.  2.  Pulmonary - On room air. Chest tube removed yesterday. CXR this am appears stable (minor subcutaneous emphysema right lateral chest wall, no ptx).  Encourage incentive spirometer. Await final pathology 3. Expected blood loss anemia-H and H  this am decreased to 12.9 and 37.1 4. On Lovenox for DVT prophylaxis 5. Pain is not under good control. Patient takes Norco for back pain but that does not help his thoracic pain much. As discussed with Dr. Kipp Brood, will start Gabapentin. Continue Skelaxin PRN muscle spasma 6. Possible discharge in am if pain under better control.  Donielle M ZimmermanPA-C 03/06/2021,10:06 AM [phone]    Doing well Adding gabapentin Home tomorrow  Lajuana Matte

## 2021-03-07 MED ORDER — VALSARTAN 320 MG PO TABS: 160.0000 mg | ORAL_TABLET | Freq: Every day | ORAL | Status: DC

## 2021-03-07 MED ORDER — GABAPENTIN 100 MG PO CAPS: 200.0000 mg | ORAL_CAPSULE | Freq: Three times a day (TID) | ORAL | 1 refills | Status: DC

## 2021-03-07 NOTE — TOC Transition Note (Signed)
Transition of Care [facility]) - CM/SW Discharge Note   Patient Details  Name: Richard Parrish MRN: [id number] Date of Birth: 01-01-1951  [facility] CM/SW Contact:  Bartholomew Crews, RN Phone Number: [phone] 03/07/2021, 10:47 AM   Clinical Narrative:     Patient to transition home today. No TOC needs identified at this time.   Final next level of care: Home/Self Care Barriers to Discharge: No Barriers Identified   Patient Goals and CMS Choice     Choice offered to / list presented to : NA  Discharge Placement                       Discharge Plan and Services                DME Arranged: N/A DME Agency: NA       HH Arranged: NA HH Agency: NA        Social Determinants of Health (SDOH) Interventions     Readmission Risk Interventions No flowsheet data found.

## 2021-03-07 NOTE — Progress Notes (Signed)
° °   °  [address]       [place] [address]             [phone]       4 Days Post-Op Procedure(s) (LRB): XI ROBOTIC ASSISTED THORASCOPY-RIGHT UPPER LOBE WEDGE RESECTION, RIGHT MIDDLE LOBE WEDGE RESECTION, RIGHT UPPER LOBECTOMY (Right) INTERCOSTAL NERVE BLOCK LYMPH NODE DISSECTION LYMPH NODE BIOPSY  Subjective: Patient states pain under better control with Gababepentin  Objective: Vital signs in last 24 hours: Temp:  [97.7 F (36.5 C)-98.3 F (36.8 C)] 98.2 F (36.8 C) (01/08 0748) Pulse Rate:  [65-82] 82 (01/08 0748) Cardiac Rhythm: Normal sinus rhythm;Heart block (01/08 0828) Resp:  [17-22] 18 (01/08 0748) BP: (113-156)/(59-83) 146/73 (01/08 0748) SpO2:  [97 %-100 %] 97 % (01/08 0748)      Intake/Output from previous day: No intake/output data recorded.   Physical Exam:  Cardiovascular: RRR Pulmonary: Clear to auscultation bilaterally Abdomen: Soft, non tender, bowel sounds present. Extremities: No LE edema Wounds: Clean and dry.  No erythema or signs of infection.   Lab Results: CBC: Recent Labs    03/05/21 0013  WBC 11.1*  HGB 12.9*  HCT 37.1*  PLT 294    BMET:  Recent Labs    03/05/21 0013  NA 133*  K 4.1  CL 101  CO2 26  GLUCOSE 124*  BUN 20  CREATININE 0.98  CALCIUM 8.4*     PT/INR:  No results for input(s): LABPROT, INR in the last 72 hours.  ABG:  INR: Will add last result for INR, ABG once components are confirmed Will add last 4 CBG results once components are confirmed  Assessment/Plan:  1. CV - SR, first degree heart block. On Imdur 30 mg daily and Toprol XL 25 mg daily. Will resume both Toprol XL 50 mg and Valsartan 160 mg daily at discharge (as taken prior to surgery) 2.  Pulmonary - On room air. Chest tube removed yesterday. CXR this am appears stable (minor subcutaneous emphysema right lateral chest wall, no ptx).  Encourage incentive spirometer. Await final pathology 3. Expected blood loss  anemia-Last H and H decreased to 12.9 and 37.1 4. On Lovenox for DVT prophylaxis 5. Pain is under better control. Patient takes Norco for back pain  As discussed with Dr. Kipp Brood, continue scheduled Gabapentin and Skelaxin PRN muscle spasms 6. As discussed with Dr. Kipp Brood, will discharge  [facility] ZimmermanPA-C 03/07/2021,9:40 AM [phone]

## 2021-03-08 ENCOUNTER — Other Ambulatory Visit: Payer: Self-pay

## 2021-03-08 ENCOUNTER — Encounter: Payer: Self-pay | Admitting: Pulmonary Disease

## 2021-03-08 ENCOUNTER — Ambulatory Visit (INDEPENDENT_AMBULATORY_CARE_PROVIDER_SITE_OTHER): Payer: Medicare Other | Admitting: Pulmonary Disease

## 2021-03-08 VITALS — BP 114/66 | HR 101 | Temp 97.7°F | Ht 72.0 in | Wt 213.2 lb

## 2021-03-08 DIAGNOSIS — C3411 Malignant neoplasm of upper lobe, right bronchus or lung: Secondary | ICD-10-CM | POA: Diagnosis not present

## 2021-03-08 DIAGNOSIS — R911 Solitary pulmonary nodule: Secondary | ICD-10-CM | POA: Diagnosis not present

## 2021-03-08 DIAGNOSIS — G8929 Other chronic pain: Secondary | ICD-10-CM | POA: Diagnosis not present

## 2021-03-08 DIAGNOSIS — J849 Interstitial pulmonary disease, unspecified: Secondary | ICD-10-CM

## 2021-03-08 NOTE — Progress Notes (Signed)
Richard Parrish    [id number]    01/20/1951  Primary Care Physician:Polite, Jori Moll, MD  Referring Physician: Seward Carol, MD 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  Complains of chronic dyspnea on exertion, cough.  No fevers or chills  He has chronic back pain for which he is on multiple pain medications, severe depression as a result of this.  He had to give up golfing which he loves due to back issues.  Pets: 3 cats Occupation: Used to work in Librarian, academic at [employer] Exposures: Exposed to [employer] carrier. Smoking history: 30-pack-year smoker.  Quit in 1989.  Currently vapes which helps with his back. Travel history: No significant recent travel history Relevant family history: No family history of lung disease  Interim history: He had CT and PET scan which showed right upper lobe lung nodule with FDG uptake Case discussed at multidisciplinary tumor board on 11/17 with recommendations for evaluation by surgery  He underwent combined bronchoscopy with Dr. Valeta Harms and subsequently right upper lobe robotic resection by Dr. Kipp Brood on 03/03/2021.  He is doing well post discharge.  Still has some pain at the incision.  Preliminary cytology from the bronchoscopy shows adenocarcinoma.  Outpatient Encounter Medications as of 03/08/2021  Medication Sig   acetaminophen (TYLENOL) 500 MG tablet Take 500 mg by mouth every 6 (six) hours as needed (pain).   ALPRAZolam (XANAX) 0.5 MG tablet Take 0.25-0.5 mg by  mouth 3 (three) times daily as needed.   atorvastatin (LIPITOR) 80 MG tablet TAKE 1 TABLET BY MOUTH EVERY DAY   budesonide (ENTOCORT EC) 3 MG 24 hr capsule Take 3-6 mg by mouth See admin instructions. Takes 2 capsules (6 mg) by mouth in the morning and take 1 capsule (3 mg) by mouth at night   cholecalciferol (VITAMIN D3) 25 MCG (1000 UNIT) tablet Take 1,000 Units by mouth in the morning.   clopidogrel (PLAVIX) 75 MG tablet TAKE 1 TABLET BY MOUTH EVERY DAY   gabapentin (NEURONTIN) 100 MG capsule Take 2 capsules (200 mg total) by mouth 3 (three) times daily.   Glucosamine HCl-MSM (GLUCOSAMINE-MSM PO) Take 1 tablet by mouth daily at 12 noon.   HYDROcodone-acetaminophen (NORCO) 7.5-325 MG tablet Take 1 tablet by mouth 5 (five) times daily.   hyoscyamine (LEVSIN, ANASPAZ) 0.125 MG tablet Take 0.125 mg by mouth every 4 (four) hours as needed for cramping.    isosorbide mononitrate (IMDUR) 30 MG 24 hr tablet Take 1 tablet (30 mg total) by mouth daily.   metaxalone (SKELAXIN) 800 MG tablet Take 800 mg by mouth 3 (three) times daily as needed for muscle spasms.   metoprolol succinate (TOPROL-XL) 50 MG 24 hr tablet TAKE 1 TABLET BY MOUTH EVERY DAY AFTER A MEAL   nitroGLYCERIN (NITROSTAT) 0.4 MG SL tablet Place 0.4 mg under the tongue every 5 (five) minutes as needed for chest pain.   pantoprazole (PROTONIX) 40 MG tablet TAKE 1 TABLET BY MOUTH DAILY BEFORE BREAKFAST   PARoxetine (PAXIL) 40 MG tablet Take 40 mg by mouth in the morning.  Probiotic Product (PROBIOTIC PO) Take 1 capsule by mouth daily.   tamsulosin (FLOMAX) 0.4 MG CAPS capsule Take 0.4 mg by mouth 2 (two) times daily.   valsartan (DIOVAN) 320 MG tablet Take 0.5 tablets (160 mg total) by mouth daily.   No facility-administered encounter medications on file as of 03/08/2021.    Physical Exam: Blood pressure 114/66, pulse (!) 101, temperature 97.7 F (36.5 C), temperature source Oral, height 6' (1.829 m), weight 213 lb 3.2 oz (96.7 kg), SpO2  95 %. Gen:      No acute distress HEENT:  EOMI, sclera anicteric Neck:     No masses; no thyromegaly Lungs:    Clear to auscultation bilaterally; normal respiratory effort CV:         Regular rate and rhythm; no murmurs Abd:      + bowel sounds; soft, non-tender; no palpable masses, no distension Ext:    No edema; adequate peripheral perfusion Skin:      Warm and dry; no rash Neuro: alert and oriented x 3 Psych: normal mood and affect   Data Reviewed: Imaging: CT chest 07/18/2013-interstitial fibrosis with traction bronchiectasis with basilar predominance, emphysema.  Probable UIP pattern CT abdomen pelvis 02/07/17- basal pulmonary fibrosis. CT high-res 12/18/2020-new spiculated lung nodule in the right upper lobe PET scan 01/05/2021-uptake in the right upper lobe, equivocal uptake in the right hilum.  Left sphenoid sinus disease. I have reviewed the images personally  PFTs: 11/27/2020 FVC 4.03 [85%], FEV1 2.95 [84%], F/F 73, TLC 6.09 [81%], DLCO 20.01 [73%] Minimal diffusion defect.  Labs: CTD serologies 11/26/2020-P ANCA 1:80  Assessment:  Right upper lobe lung adenocarcinoma S/p bronchoscopy and lobectomy He is making good recovery post surgery. 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  Has slight progression on repeat CT scan Elevation in ANCA is likely nonspecific as he has no signs of connective tissue disease.  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   Plan/Recommendations: Referral to oncology Pain clinic referral Discussed path results at multidisciplinary ILD conference  Marshell Garfinkel MD [facility] 03/08/2021, 11:39 AM  CC: Seward Carol,  MD

## 2021-03-08 NOTE — Patient Instructions (Signed)
I am glad you are doing well after your surgery We will check your oxygen levels on exertion I will review your results with the pathologist next Make a referral to oncology for evaluation of new diagnosis of lung cancer Make referral to pain clinic at [facility] for management of chronic pain  Follow-up in 2 months.

## 2021-03-09 ENCOUNTER — Encounter: Payer: Self-pay | Admitting: *Deleted

## 2021-03-09 DIAGNOSIS — Z902 Acquired absence of lung [part of]: Secondary | ICD-10-CM

## 2021-03-09 NOTE — Progress Notes (Signed)
Oncology Nurse Navigator Documentation  Oncology Nurse Navigator Flowsheets 03/09/2021  Abnormal Finding Date 12/21/2020  Confirmed Diagnosis Date 03/03/2021  Diagnosis Status Confirmed Diagnosis Complete  Planned Course of Treatment Surgery  Phase of Treatment Surgery  Surgery Actual Start Date: 03/03/2021  Navigator Follow Up Date: 03/23/2021  Navigator Follow Up Reason: New Patient Appointment/I received referral on Richard Parrish. I updated new patient coordinator to call and schedule an appt to see med onc on 1/24 with labs   Navigator Location [facility]  Referral Date to RadOnc/MedOnc 03/08/2021  Navigator Encounter Type Other:  Patient Visit Type Other  Treatment Phase Other  Barriers/Navigation Needs Coordination of Care  Interventions Coordination of Care  Acuity Level 2-Minimal Needs (1-2 Barriers Identified)  Coordination of Care Other  Time Spent with Patient 30

## 2021-03-10 ENCOUNTER — Telehealth: Payer: Self-pay | Admitting: Internal Medicine

## 2021-03-10 NOTE — Telephone Encounter (Signed)
Scheduled appt per 1/9 referral. Pt is aware of appt date and time. Pt is aware to arrive 15 mins prior to appt time.  °

## 2021-03-11 ENCOUNTER — Other Ambulatory Visit: Payer: Self-pay | Admitting: *Deleted

## 2021-03-11 DIAGNOSIS — C3411 Malignant neoplasm of upper lobe, right bronchus or lung: Secondary | ICD-10-CM | POA: Diagnosis not present

## 2021-03-11 DIAGNOSIS — R911 Solitary pulmonary nodule: Secondary | ICD-10-CM | POA: Diagnosis not present

## 2021-03-11 DIAGNOSIS — Z902 Acquired absence of lung [part of]: Secondary | ICD-10-CM | POA: Diagnosis not present

## 2021-03-11 DIAGNOSIS — I959 Hypotension, unspecified: Secondary | ICD-10-CM | POA: Diagnosis not present

## 2021-03-11 DIAGNOSIS — J849 Interstitial pulmonary disease, unspecified: Secondary | ICD-10-CM | POA: Diagnosis not present

## 2021-03-11 NOTE — Progress Notes (Signed)
The proposed treatment discussed in conference is for discussion purpose only and is not a binding recommendation. The patient was not been physically examined, or presented with their treatment options. Therefore, final treatment plans cannot be decided.  

## 2021-03-19 ENCOUNTER — Other Ambulatory Visit: Payer: Self-pay

## 2021-03-19 ENCOUNTER — Ambulatory Visit (INDEPENDENT_AMBULATORY_CARE_PROVIDER_SITE_OTHER): Payer: Self-pay | Admitting: Thoracic Surgery (Cardiothoracic Vascular Surgery)

## 2021-03-19 VITALS — BP 139/90 | HR 90 | Resp 20 | Ht 72.0 in | Wt 211.0 lb

## 2021-03-19 DIAGNOSIS — R413 Other amnesia: Secondary | ICD-10-CM | POA: Insufficient documentation

## 2021-03-19 DIAGNOSIS — J849 Interstitial pulmonary disease, unspecified: Secondary | ICD-10-CM

## 2021-03-19 DIAGNOSIS — K219 Gastro-esophageal reflux disease without esophagitis: Secondary | ICD-10-CM | POA: Insufficient documentation

## 2021-03-19 DIAGNOSIS — G4733 Obstructive sleep apnea (adult) (pediatric): Secondary | ICD-10-CM | POA: Insufficient documentation

## 2021-03-19 DIAGNOSIS — F322 Major depressive disorder, single episode, severe without psychotic features: Secondary | ICD-10-CM | POA: Insufficient documentation

## 2021-03-19 DIAGNOSIS — G4700 Insomnia, unspecified: Secondary | ICD-10-CM | POA: Insufficient documentation

## 2021-03-19 DIAGNOSIS — K589 Irritable bowel syndrome without diarrhea: Secondary | ICD-10-CM | POA: Insufficient documentation

## 2021-03-19 DIAGNOSIS — C3411 Malignant neoplasm of upper lobe, right bronchus or lung: Secondary | ICD-10-CM | POA: Insufficient documentation

## 2021-03-19 DIAGNOSIS — M545 Low back pain, unspecified: Secondary | ICD-10-CM | POA: Insufficient documentation

## 2021-03-19 DIAGNOSIS — F419 Anxiety disorder, unspecified: Secondary | ICD-10-CM | POA: Insufficient documentation

## 2021-03-19 DIAGNOSIS — Z09 Encounter for follow-up examination after completed treatment for conditions other than malignant neoplasm: Secondary | ICD-10-CM

## 2021-03-19 DIAGNOSIS — J8409 Other alveolar and parieto-alveolar conditions: Secondary | ICD-10-CM | POA: Insufficient documentation

## 2021-03-19 DIAGNOSIS — G894 Chronic pain syndrome: Secondary | ICD-10-CM | POA: Insufficient documentation

## 2021-03-19 DIAGNOSIS — K52832 Lymphocytic colitis: Secondary | ICD-10-CM | POA: Insufficient documentation

## 2021-03-19 DIAGNOSIS — Z902 Acquired absence of lung [part of]: Secondary | ICD-10-CM | POA: Insufficient documentation

## 2021-03-19 DIAGNOSIS — Z720 Tobacco use: Secondary | ICD-10-CM | POA: Insufficient documentation

## 2021-03-19 DIAGNOSIS — F4489 Other dissociative and conversion disorders: Secondary | ICD-10-CM | POA: Insufficient documentation

## 2021-03-19 DIAGNOSIS — M5430 Sciatica, unspecified side: Secondary | ICD-10-CM | POA: Insufficient documentation

## 2021-03-19 DIAGNOSIS — R350 Frequency of micturition: Secondary | ICD-10-CM | POA: Insufficient documentation

## 2021-03-19 DIAGNOSIS — C3491 Malignant neoplasm of unspecified part of right bronchus or lung: Secondary | ICD-10-CM

## 2021-03-19 NOTE — Progress Notes (Signed)
[address]       [place] [address]             [phone]        Richard Parrish [facility] Medical Record #[id number] Date of Birth: 15-Sep-1950  Referring: Marshell Garfinkel, MD Primary Care: Seward Carol, MD Primary Cardiologist:Henry Carlye Grippe, MD  Reason for visit:   follow-up  History of Present Illness:     Richard Parrish presents for his 1 week follow-up appointment.  He underwent a right upper lobectomy for lung cancer as well as a wedge resection of the right lower lobe for presumed interstitial lung disease.  He continues to have some shortness of breath with exertion.  His pain is well controlled with Tylenol and Norco.  Physical Exam: BP 139/90    Pulse 90    Resp 20    Ht 6' (1.829 m)    Wt 211 lb (95.7 kg)    SpO2 91% Comment: RA   BMI 28.62 kg/m   Alert NAD Incision well-healed, stitch removed. Abdomen soft, ND No peripheral edema   Diagnostic Studies & Laboratory data:  Path:  FINAL MICROSCOPIC DIAGNOSIS:   A. LUNG, RIGHT UPPER LOBE, WEDGE RESECTION:  -  Mixed invasive mucinous and non-mucinous adenocarcinoma, grade 2  (moderately differentiated), 2.2 cm in greatest dimension, visceral  pleural invasion not identified (pleura present in slide A5),  lymphovascular invasion not identified.   B. LYMPH NODE, 8, EXCISION:  -  Lymph node, negative for malignancy (0/1).  -  Anthracosis.   C. LYMPH NODES, LEVEL 4 #1, EXCISION:  -  Two lymph nodes, negative for malignancy (0/2).  -  Anthracosis.   D. LYMPH NODE, LEVEL 4 #2, EXCISION:  -  Lymph node, fragmented, negative for malignancy (0/1).  -  Anthracosis.   E. LYMPH NODE, LEVEL 4 #3, EXCISION:  -  Lymph node, negative for malignancy (0/1).  -  Anthracosis.   F. LYMPH NODE, LEVEL 4 #4, EXCISION:  -  Lymph node, negative for malignancy (0/1).  -  Anthracosis.   G. LYMPH NODE, LEVEL 4 #5, EXCISION:  -  Lymph node, negative for malignancy (0/1).  -  Anthracosis.   H.  LYMPH NODE, HILAR, EXCISION:  -  Lymph node, negative for malignancy (0/1).  -  Anthracosis.   I. LYMPH NODE, HILAR #2, EXCISION:  -  Lymph node, fragmented, negative for malignancy (0/1).  -  Anthracosis.   J. LYMPH NODE, HILAR #3, EXCISION:  -  Lymph node, two fragments, negative for malignancy (0/1).  -  Anthracosis.   K. LUNG, RIGHT UPPER LOBE, COMPLETION LOBECTOMY:  -  No residual malignancy identified (post-wedge biopsy).  -  Vascular and bronchial resection margins negative for malignancy.  -  Five anthracotic hilar lymph nodes, negative for malignancy (0/5).  -  Focal pleural fibrosis, benign.   L. LUNG, RIGHT LOWER LOBE, WEDGE RESECTION:  -  Negative for malignancy.  -  Pleural/subpleural fibrosis.  -  Focal intraglandular acute inflammatory cells, suggestive of acute  bronchopneumonia.   LUNG: Resection   Synchronous Tumors: Not applicable  Total Number of Primary Tumors: One  Procedure: Completion lobectomy  Specimen Laterality: Right  Tumor Focality: Unifocal  Tumor Site: Upper lobe of lung  Tumor Size: 2.2 cm in greatest dimension       Total Tumor Size: 2.2 cm in greatest dimension  Histologic Type: Mixed invasive mucinous and non-mucinous adenocarcinoma  Visceral Pleura Invasion:  Not identified  Direct Invasion of Adjacent Structures: No adjacent structures present  Lymphovascular Invasion: Not identified  Margins: All margins negative for invasive carcinoma       Closest Margin(s) to Invasive Carcinoma: 4.5 cm       Margin(s) Involved by Invasive Carcinoma: Not applicable        Margin Status for Non-Invasive Tumor: Not applicable  Treatment Effect: No known presurgical therapy       Percentage of Residual Viable Tumor: Not applicable  Regional Lymph Nodes: Regional lymph nodes present, all regional lymph  nodes negative for tumor       Number of Lymph Nodes Involved: 0.                            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  I instructed him to stay under 3 g of Tylenol per day, and to continue with a walking regimen.  He will follow-up with Dr. Vaughan Browner.  He will follow-up with me in 1 month with another chest x-ray.   Lajuana Matte 03/19/2021 12:54 PM

## 2021-03-24 ENCOUNTER — Inpatient Hospital Stay: Payer: Medicare Other | Attending: Internal Medicine

## 2021-03-24 ENCOUNTER — Inpatient Hospital Stay (HOSPITAL_BASED_OUTPATIENT_CLINIC_OR_DEPARTMENT_OTHER): Payer: Medicare Other | Admitting: Internal Medicine

## 2021-03-24 ENCOUNTER — Other Ambulatory Visit: Payer: Self-pay

## 2021-03-24 ENCOUNTER — Encounter: Payer: Self-pay | Admitting: Internal Medicine

## 2021-03-24 VITALS — BP 142/80 | HR 76 | Temp 97.8°F | Resp 20 | Ht 72.0 in | Wt 212.6 lb

## 2021-03-24 DIAGNOSIS — M549 Dorsalgia, unspecified: Secondary | ICD-10-CM | POA: Insufficient documentation

## 2021-03-24 DIAGNOSIS — C3431 Malignant neoplasm of lower lobe, right bronchus or lung: Secondary | ICD-10-CM | POA: Diagnosis not present

## 2021-03-24 DIAGNOSIS — R0789 Other chest pain: Secondary | ICD-10-CM | POA: Diagnosis not present

## 2021-03-24 DIAGNOSIS — C3411 Malignant neoplasm of upper lobe, right bronchus or lung: Secondary | ICD-10-CM

## 2021-03-24 DIAGNOSIS — Z79899 Other long term (current) drug therapy: Secondary | ICD-10-CM | POA: Diagnosis not present

## 2021-03-24 DIAGNOSIS — J8410 Pulmonary fibrosis, unspecified: Secondary | ICD-10-CM | POA: Diagnosis not present

## 2021-03-24 DIAGNOSIS — Z902 Acquired absence of lung [part of]: Secondary | ICD-10-CM

## 2021-03-24 DIAGNOSIS — Z87891 Personal history of nicotine dependence: Secondary | ICD-10-CM | POA: Diagnosis not present

## 2021-03-24 DIAGNOSIS — U070 Vaping-related disorder: Secondary | ICD-10-CM

## 2021-03-24 DIAGNOSIS — C3490 Malignant neoplasm of unspecified part of unspecified bronchus or lung: Secondary | ICD-10-CM

## 2021-03-24 LAB — CBC WITH DIFFERENTIAL (CANCER CENTER ONLY)
Abs Immature Granulocytes: 0.05 10*3/uL (ref 0.00–0.07)
Basophils Absolute: 0.1 10*3/uL (ref 0.0–0.1)
Basophils Relative: 1 %
Eosinophils Absolute: 0.6 10*3/uL — ABNORMAL HIGH (ref 0.0–0.5)
Eosinophils Relative: 6 %
HCT: 39.8 % (ref 39.0–52.0)
Hemoglobin: 13.5 g/dL (ref 13.0–17.0)
Immature Granulocytes: 1 %
Lymphocytes Relative: 10 %
Lymphs Abs: 1 10*3/uL (ref 0.7–4.0)
MCH: 32 pg (ref 26.0–34.0)
MCHC: 33.9 g/dL (ref 30.0–36.0)
MCV: 94.3 fL (ref 80.0–100.0)
Monocytes Absolute: 0.8 10*3/uL (ref 0.1–1.0)
Monocytes Relative: 8 %
Neutro Abs: 7.6 10*3/uL (ref 1.7–7.7)
Neutrophils Relative %: 74 %
Platelet Count: 427 10*3/uL — ABNORMAL HIGH (ref 150–400)
RBC: 4.22 MIL/uL (ref 4.22–5.81)
RDW: 12.5 % (ref 11.5–15.5)
WBC Count: 10.1 10*3/uL (ref 4.0–10.5)
nRBC: 0 % (ref 0.0–0.2)

## 2021-03-24 LAB — CMP (CANCER CENTER ONLY)
ALT: 19 U/L (ref 0–44)
AST: 20 U/L (ref 15–41)
Albumin: 3.6 g/dL (ref 3.5–5.0)
Alkaline Phosphatase: 107 U/L (ref 38–126)
Anion gap: 7 (ref 5–15)
BUN: 24 mg/dL — ABNORMAL HIGH (ref 8–23)
CO2: 25 mmol/L (ref 22–32)
Calcium: 8.7 mg/dL — ABNORMAL LOW (ref 8.9–10.3)
Chloride: 104 mmol/L (ref 98–111)
Creatinine: 1.05 mg/dL (ref 0.61–1.24)
GFR, Estimated: >60 mL/min (ref >60)
Glucose, Bld: 105 mg/dL — ABNORMAL HIGH (ref 70–99)
Potassium: 4.2 mmol/L (ref 3.5–5.1)
Sodium: 136 mmol/L (ref 135–145)
Total Bilirubin: 0.7 mg/dL (ref 0.3–1.2)
Total Protein: 6.6 g/dL (ref 6.5–8.1)

## 2021-03-24 NOTE — Progress Notes (Signed)
[place] [phone]   [phone]  CONSULT NOTE  REFERRING PHYSICIAN: Dr. Melodie Bouillon  REASON FOR CONSULTATION:  71 years old white male recently diagnosed with lung cancer.  HPI Richard Parrish is a 71 y.o. male with past medical history significant for multiple medical problems including history of osteoarthritis, coronary artery disease, GERD, hypertension, dyslipidemia, peripheral vascular disease, history of pulmonary fibrosis, benign prostatic hypertrophy, and anxiety/depression.  The patient also has a history of smoking for around 15 years but quit in 1989.  He was followed by Dr. Vaughan Browner for his pulmonary fibrosis and was complaining of shortness of breath.  He has CT scan of the chest on December 18, 2020 and that showed new spiculated nodule in the anterior segment of the right upper lobe measuring 2.1 x 2.2 cm.  The patient had a PET scan on January 05, 2021 and that showed the area of concern in the right upper lobe with spiculated appearance measuring 2.0 x 1.8 cm with SUV max of 4.28.  The right paratracheal lymph node seen on the previous CT imaging had no metabolic activity.  There was a small right infrahilar lymph node with maximum SUV of 3.5 and measured 0.6 cm.  The patient was referred to Dr. Valeta Harms and Dr. Kipp Brood for evaluation of his condition.  On March 03, 2021 he underwent flexible video fiberoptic bronchoscopy with robotic assistant and biopsy by Dr. Valeta Harms followed by robotic assisted right video thoracoscopy with right upper lobectomy and right lower lobe wedge resection with mediastinal lymph node sampling under the care of Dr. Kipp Brood.  The final pathology ([id number]) showed mixed invasive mucinous and nonmucinous adenocarcinoma, grade 2, moderately differentiated measuring 2.2 cm in greatest dimension with no evidence for visceral pleural or lymphovascular invasion.  The dissected lymph nodes were negative for  malignancy. The patient is recovering slowly from his surgery and he was referred by Dr. Kipp Brood to me today for evaluation and surveillance of his lung cancer. When seen today he continues to have dry cough with occasional mucus production.  He also has some soreness on the right side of the chest and he is currently on chronic pain medication for the back with hydrocodone in addition to gabapentin 200 mg p.o. 3 times daily.  The patient continues to have generalized weakness and shortness of breath with exertion.  He has no nausea, vomiting, diarrhea or constipation.  He has no headache or visual changes.  He has no weight loss or night sweats. Family history significant for mother with heart disease.  Father had stomach cancer at age 27. The patient is married and has no children.  He was accompanied today by his wife [place].  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  He had a history of smoking up to 2 pack/day for around 15 years but quit in 1989.  He drinks alcohol occasionally and no history of drug abuse.  HPI  Past Medical History:  Diagnosis Date   Anxiety    Arthritis    "lower back" (04/07/2017)   BPH (benign prostatic hypertrophy)    C. difficile diarrhea    CAD (coronary artery disease)    Chronic lower back pain    Coronary artery disease 1989   cabg with dvg to rca    Depression    GERD (gastroesophageal reflux disease)    Hyperlipidemia    Hypertension    Insomnia  Leg pain    Myocardial infarction ([place]) 2002   OSA on CPAP    cpap setting of 60 per pt   Osteoarthritis    "hips" (04/07/2017)   PAD (peripheral artery disease) ([place])    Peripheral vascular disease ([place])    Pneumonia 1989   S/P CABG   Pulmonary fibrosis ([place])    "one time" (04/07/2017)   Spinal stenosis     Past Surgical History:  Procedure Laterality Date   ABDOMINAL AORTAGRAM N/A 03/15/2011   Procedure: ABDOMINAL Maxcine Ham;  Surgeon: Serafina Mitchell, MD;  Location: [facility] CATH LAB;  Service: Cardiovascular;  Laterality: N/A;   ABDOMINAL AORTAGRAM N/A 04/12/2011   Procedure: ABDOMINAL Maxcine Ham;  Surgeon: Serafina Mitchell, MD;  Location: [facility] CATH LAB;  Service: Cardiovascular;  Laterality: N/A;   BACK SURGERY     BRONCHIAL BIOPSY  03/03/2021   Procedure: BRONCHIAL BIOPSIES;  Surgeon: Garner Nash, DO;  Location: [place] ENDOSCOPY;  Service: Pulmonary;;   BRONCHIAL BRUSHINGS  03/03/2021   Procedure: BRONCHIAL BRUSHINGS;  Surgeon: Garner Nash, DO;  Location: [place];  Service: Pulmonary;;   BRONCHIAL NEEDLE ASPIRATION BIOPSY  03/03/2021   Procedure: BRONCHIAL NEEDLE ASPIRATION BIOPSIES;  Surgeon: Garner Nash, DO;  Location: [place] ENDOSCOPY;  Service: Pulmonary;;   CARDIAC CATHETERIZATION  08/1987   CORONARY ANGIOPLASTY WITH STENT PLACEMENT  ~ 2002   [facility], [place]   CORONARY ARTERY BYPASS GRAFT  09/1987   "CABG X1";  [facility]; [place]; "RCA"   FIDUCIAL MARKER PLACEMENT  03/03/2021   Procedure: FIDUCIAL DYE MARKING;  Surgeon: Garner Nash, DO;  Location: [place] ENDOSCOPY;  Service: Pulmonary;;   INTERCOSTAL NERVE BLOCK  03/03/2021   Procedure: INTERCOSTAL NERVE BLOCK;  Surgeon: Lajuana Matte, MD;  Location: [place];  Service: Thoracic;;   JOINT REPLACEMENT     KNEE ARTHROSCOPY Left    LAPAROSCOPIC CHOLECYSTECTOMY     LYMPH NODE BIOPSY  03/03/2021   Procedure: LYMPH NODE BIOPSY;  Surgeon: Lajuana Matte, MD;  Location: [place];  Service: Thoracic;;   LYMPH NODE DISSECTION  03/03/2021   Procedure: LYMPH NODE DISSECTION;  Surgeon: Lajuana Matte, MD;  Location: [place];  Service: Thoracic;;   PERIPHERAL VASCULAR INTERVENTION Left 2016   "put a stent; right branch of left femoral"   POSTERIOR LUMBAR FUSION  2009   lumbar fusion, S L 5 to L 4   RIGHT/LEFT HEART CATH AND CORONARY/GRAFT ANGIOGRAPHY N/A 10/05/2020   Procedure: RIGHT/LEFT HEART CATH AND CORONARY/GRAFT ANGIOGRAPHY;  Surgeon: Nelva Bush, MD;   Location: [place] CV LAB;  Service: Cardiovascular;  Laterality: N/A;   SHOULDER ARTHROSCOPY W/ ROTATOR CUFF REPAIR Right 2006   SHOULDER ARTHROSCOPY WITH SUBACROMIAL DECOMPRESSION AND OPEN ROTATOR C Left ~ [place]     TOTAL HIP ARTHROPLASTY Right 2012   TOTAL HIP ARTHROPLASTY Left 04/07/2017   TOTAL HIP ARTHROPLASTY Left 04/07/2017   Procedure: LEFT TOTAL HIP ARTHROPLASTY;  Surgeon: Earlie Server, MD;  Location: [place];  Service: Orthopedics;  Laterality: Left;   VIDEO BRONCHOSCOPY WITH RADIAL ENDOBRONCHIAL ULTRASOUND  03/03/2021   Procedure: VIDEO BRONCHOSCOPY WITH RADIAL ENDOBRONCHIAL ULTRASOUND;  Surgeon: Garner Nash, DO;  Location: [facility] ENDOSCOPY;  Service: Pulmonary;;    Family History  Problem Relation Age of Onset   Heart disease Mother    Cancer Father     Social History Social History   Tobacco Use   Smoking status: Former    Packs/day:  2.00    Years: 15.00    Pack years: 30.00    Types: Cigarettes    Quit date: 03/01/1987    Years since quitting: 34.0   Smokeless tobacco: Never   Tobacco comments:    03/02/21-Pt instructed to not vape or drink alcohol for 24 hours prior to surgery.   Vaping Use   Vaping Use: Every day   Start date: 12/29/2020   Devices: [place]  Substance Use Topics   Alcohol use: Yes    Comment: couple drinks/month   Drug use: No    Allergies  Allergen Reactions   Ambien [Zolpidem Tartrate] Other (See Comments)    Bad dreams/Vivid Dreams    Current Outpatient Medications  Medication Sig Dispense Refill   acetaminophen (TYLENOL) 500 MG tablet Take 500 mg by mouth every 6 (six) hours as needed (pain).     ALPRAZolam (XANAX) 0.5 MG tablet Take 0.25-0.5 mg by mouth 3 (three) times daily as needed.     atorvastatin (LIPITOR) 80 MG tablet TAKE 1 TABLET BY MOUTH EVERY DAY 90 tablet 1   budesonide (ENTOCORT EC) 3 MG 24 hr capsule Take 3-6 mg by mouth See admin instructions. Takes 2 capsules (6 mg) by mouth in the  morning and take 1 capsule (3 mg) by mouth at night     cholecalciferol (VITAMIN D3) 25 MCG (1000 UNIT) tablet Take 1,000 Units by mouth in the morning.     clopidogrel (PLAVIX) 75 MG tablet TAKE 1 TABLET BY MOUTH EVERY DAY 30 tablet 11   gabapentin (NEURONTIN) 100 MG capsule Take 2 capsules (200 mg total) by mouth 3 (three) times daily. 90 capsule 1   Glucosamine HCl-MSM (GLUCOSAMINE-MSM PO) Take 1 tablet by mouth daily at 12 noon.     HYDROcodone-acetaminophen (NORCO) 7.5-325 MG tablet Take 1 tablet by mouth 5 (five) times daily.     hyoscyamine (LEVSIN, ANASPAZ) 0.125 MG tablet Take 0.125 mg by mouth every 4 (four) hours as needed for cramping.      isosorbide mononitrate (IMDUR) 30 MG 24 hr tablet Take 1 tablet (30 mg total) by mouth daily. 30 tablet 11   metaxalone (SKELAXIN) 800 MG tablet Take 800 mg by mouth 3 (three) times daily as needed for muscle spasms.     metoprolol succinate (TOPROL-XL) 50 MG 24 hr tablet TAKE 1 TABLET BY MOUTH EVERY DAY AFTER A MEAL (Patient taking differently: 25 mg daily.) 90 tablet 2   nitroGLYCERIN (NITROSTAT) 0.4 MG SL tablet Place 0.4 mg under the tongue every 5 (five) minutes as needed for chest pain.     pantoprazole (PROTONIX) 40 MG tablet TAKE 1 TABLET BY MOUTH DAILY BEFORE BREAKFAST 90 tablet 1   PARoxetine (PAXIL) 40 MG tablet Take 40 mg by mouth in the morning.  6   Probiotic Product (PROBIOTIC PO) Take 1 capsule by mouth daily.     tamsulosin (FLOMAX) 0.4 MG CAPS capsule Take 0.4 mg by mouth 2 (two) times daily.  5   valsartan (DIOVAN) 320 MG tablet Take 0.5 tablets (160 mg total) by mouth daily.     No current facility-administered medications for this visit.    Review of Systems  Constitutional: positive for fatigue Eyes: negative Ears, nose, mouth, throat, and face: negative Respiratory: positive for cough, dyspnea on exertion, and pleurisy/chest pain Cardiovascular: negative Gastrointestinal:  negative Genitourinary:negative Integument/breast: negative Hematologic/lymphatic: negative Musculoskeletal:positive for back pain Neurological: negative Behavioral/Psych: negative Endocrine: negative Allergic/Immunologic: negative  Physical Exam  EXH:BZJIR, healthy, no distress, well nourished,  and well developed SKIN: skin color, texture, turgor are normal, no rashes or significant lesions HEAD: Normocephalic, No masses, lesions, tenderness or abnormalities EYES: normal, PERRLA, Conjunctiva are pink and non-injected EARS: External ears normal, Canals clear OROPHARYNX:no exudate, no erythema, and lips, buccal mucosa, and tongue normal  NECK: supple, no adenopathy, no JVD LYMPH:  no palpable lymphadenopathy, no hepatosplenomegaly LUNGS: clear to auscultation , and palpation HEART: regular rate & rhythm, no murmurs, and no gallops ABDOMEN:abdomen soft, non-tender, normal bowel sounds, and no masses or organomegaly BACK: Back symmetric, no curvature., No CVA tenderness EXTREMITIES:no joint deformities, effusion, or inflammation, no edema  NEURO: alert & oriented x 3 with fluent speech, no focal motor/sensory deficits  PERFORMANCE STATUS: ECOG 1  LABORATORY DATA: Lab Results  Component Value Date   WBC 10.1 03/24/2021   HGB 13.5 03/24/2021   HCT 39.8 03/24/2021   MCV 94.3 03/24/2021   PLT 427 (H) 03/24/2021      Chemistry      Component Value Date/Time   NA 133 (L) 03/05/2021 0013   NA 140 02/19/2020 0820   K 4.1 03/05/2021 0013   CL 101 03/05/2021 0013   CO2 26 03/05/2021 0013   BUN 20 03/05/2021 0013   BUN 14 02/19/2020 0820   CREATININE 0.98 03/05/2021 0013      Component Value Date/Time   CALCIUM 8.4 (L) 03/05/2021 0013   ALKPHOS 55 03/05/2021 0013   AST 22 03/05/2021 0013   ALT 13 03/05/2021 0013   BILITOT 0.7 03/05/2021 0013   BILITOT 0.4 02/19/2020 0820       RADIOGRAPHIC STUDIES: DG Chest 2 View  Result Date: 03/06/2021 CLINICAL DATA:  71 year old  male postoperative day 3 status post right upper lobectomy for non-small cell lung cancer. Chest tube removed. EXAM: CHEST - 2 VIEW COMPARISON:  Portable chest 03/05/2021 and earlier. FINDINGS: PA and lateral views of the chest at 0449 hours. Right chest tube removed. No convincing pneumothorax. Small volume right chest wall and supraclavicular gas is stable. Reduced right lung volume. Reticulonodular opacity in the mid to lower lung persists, not significantly changed compared to 03/04/2021. And stable similar opacity at the left lung base. Stable cardiac size and mediastinal contours. Remote CABG. Stable visualized osseous structures. Stable cholecystectomy clips. Negative visible bowel gas. IMPRESSION: 1. Right chest tube removed with no pneumothorax identified. 2. Right lung volume reduced after lobectomy. Continued reticulonodular opacity at the lung bases. 3. No new cardiopulmonary abnormality. Electronically Signed   By: Genevie Ann M.D.   On: 03/06/2021 05:06   DG Chest 2 View  Result Date: 03/02/2021 CLINICAL DATA:  Preoperative chest x-ray EXAM: CHEST - 2 VIEW COMPARISON:  CT scan of the chest 02/25/2021 FINDINGS: Irregular nodular opacity in the right mid lung corresponding to the known right upper lobe pulmonary nodule again noted. Bilateral lower lobe fibrotic changes similar compared to prior. History of prior remote healed rib fractures. Status post median sternotomy with evidence of prior multivessel CABG. No pneumothorax or pleural effusion. No acute osseous abnormality. Soft tissue anchors present in the proximal humerus. IMPRESSION: 1. No acute cardiopulmonary process. 2. Stable right upper lobe pulmonary nodule and bibasilar fibrotic changes. Electronically Signed   By: Jacqulynn Cadet M.D.   On: 03/02/2021 13:41   DG CHEST PORT 1 VIEW  Result Date: 03/05/2021 CLINICAL DATA:  Status post lobectomy of lung EXAM: PORTABLE CHEST 1 VIEW COMPARISON:  03/04/2021 FINDINGS: Prior median sternotomy.  Right-sided chest tube is similar in position. Tracheal deviation  right, consistent with volume loss in the right hemithorax. Mild cardiomegaly. Persistent diminished lung volumes with poor inspiratory effort. No pleural effusion or pneumothorax. Persistent small volume subcutaneous emphysema about the right chest wall. Bibasilar opacities are felt to be chronic and related to interstitial lung disease when correlated with the prior CT. IMPRESSION: Right-sided chest tube remaining in place, without pneumothorax. Extremely low lung volumes with interstitial lung disease but no well-defined lobar consolidation. Electronically Signed   By: Abigail Miyamoto M.D.   On: 03/05/2021 08:22   DG Chest Port 1 View  Result Date: 03/04/2021 CLINICAL DATA:  Chest tube present status post lobectomy of lung, sore chest. EXAM: PORTABLE CHEST 1 VIEW COMPARISON:  03/03/2021 FINDINGS: Status post median sternotomy. Surgical clips overlie the mid right mediastinum. Right-sided chest tube is again seen with tip overlying the superior right hemithorax. Cardiac silhouette appears moderately enlarged. Mediastinal contours are grossly unchanged. There is an right-sided volume loss consistent with prior lobectomy. Mild interstitial thickening and heterogeneous airspace opacification within the right lung base, unchanged. Left basilar similar very mild interstitial thickening and heterogeneous airspace opacification is unchanged. No definite pleural effusion is seen. No pneumothorax is seen. Old healed posterior left seventh rib fracture, unchanged. IMPRESSION: 1. No acute cardiopulmonary process.  No significant change prior. 2. Right-sided chest tube place.  No pneumothorax is seen. Electronically Signed   By: Yvonne Kendall   On: 03/04/2021 08:32   DG Chest Port 1 View  Result Date: 03/03/2021 CLINICAL DATA:  Status post lobectomy of the lung. EXAM: PORTABLE CHEST 1 VIEW COMPARISON:  Chest radiograph dated 03/02/2021 FINDINGS: Right-sided  chest tube with tip over the right apex. Background of emphysema and bibasilar atelectasis/scarring. No pneumothorax. No new consolidation. No pleural effusion. There is cardiomegaly. Median sternotomy wires. No acute osseous pathology. IMPRESSION: 1. No acute cardiopulmonary process. 2. Right-sided chest tube in place. Electronically Signed   By: Anner Crete M.D.   On: 03/03/2021 20:17   CT Super D Chest Wo Contrast  Result Date: 02/26/2021 CLINICAL DATA:  Preop for EBUS and lung biopsy. Right upper lobe pulmonary nodule. EXAM: CT CHEST WITHOUT CONTRAST TECHNIQUE: Multidetector CT imaging of the chest was performed using thin slice collimation for electromagnetic bronchoscopy planning purposes, without intravenous contrast. COMPARISON:  PET-CT 01/05/2021 FINDINGS: Cardiovascular: The heart is normal in size. No pericardial effusion. Stable tortuosity and calcification of the thoracic aorta. Stable surgical changes from coronary artery bypass surgery. Mediastinum/Nodes: No mediastinal or hilar mass or adenopathy. The largest node in the precarinal space measures 10 mm but was not hypermetabolic on the PET-CT. The esophagus is grossly normal. Lungs/Pleura: Stable 2.2 cm spiculated lesion right upper lobe consistent with primary lung neoplasm. Underlying interstitial lung disease and emphysema. No acute overlying pulmonary process. Upper Abdomen: No significant upper abdominal findings. No hepatic or adrenal gland lesions. Advanced aortic and branch vessel calcifications. Gallbladder is surgically absent. Musculoskeletal: No chest wall mass, supraclavicular or axillary adenopathy. The bony thorax is intact. IMPRESSION: 1. Stable 2.2 cm spiculated right upper lobe pulmonary lesion consistent with primary lung neoplasm. 2. No mediastinal or hilar mass or adenopathy. 3. Stable interstitial lung disease and emphysema. 4. Stable surgical changes from coronary artery bypass surgery. Aortic Atherosclerosis  (ICD10-I70.0) and Emphysema (ICD10-J43.9). Electronically Signed   By: Marijo Sanes M.D.   On: 02/26/2021 13:21   DG C-ARM BRONCHOSCOPY  Result Date: 03/03/2021 C-ARM BRONCHOSCOPY: Fluoroscopy was utilized by the requesting physician.  No radiographic interpretation.    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 Kipp Brood on March 03, 2021.   PLAN: I had a lengthy discussion with the patient and his wife today about his current disease stage, prognosis and treatment options. I explained to the patient that he had curative treatment with the surgical resection. I also explained to the patient that there is no survival benefit for adjuvant systemic chemotherapy for patient with stage Ia non-small cell lung cancer and the current standard of care is close monitoring and observation. I recommended for the patient to have repeat CT scan of the chest in 6 months for restaging of his disease. For the history of pulmonary fibrosis he will continue his routine follow-up visit with Dr. Vaughan Browner. For the right-sided chest pain as well as back pain he will continue his current pain medication with Hycodan as well as gabapentin He was advised to call immediately if he has any other concerning symptoms in the interval. The patient and his wife are in agreement with the current plan The patient voices understanding of current disease status and treatment options and is in agreement with the current care plan.  All questions were answered. The patient knows to call the clinic with any problems, questions or concerns. We can certainly see the patient much sooner if necessary.  Thank you so much  for allowing me to participate in the care of Richard Parrish. I will continue to follow up the patient with you and assist in his care.  The total time spent in the appointment was 60 minutes.  Disclaimer: This note was dictated with voice recognition software. Similar sounding words can inadvertently be transcribed and may not be corrected upon review.   Eilleen Kempf March 24, 2021, 2:42 PM

## 2021-03-26 ENCOUNTER — Encounter: Payer: Self-pay | Admitting: Internal Medicine

## 2021-04-01 ENCOUNTER — Ambulatory Visit (INDEPENDENT_AMBULATORY_CARE_PROVIDER_SITE_OTHER): Payer: PPO

## 2021-04-01 ENCOUNTER — Telehealth: Payer: Self-pay | Admitting: Pulmonary Disease

## 2021-04-01 ENCOUNTER — Encounter: Payer: Self-pay | Admitting: Pulmonary Disease

## 2021-04-01 ENCOUNTER — Other Ambulatory Visit: Payer: Self-pay | Admitting: *Deleted

## 2021-04-01 ENCOUNTER — Other Ambulatory Visit: Payer: Self-pay

## 2021-04-01 ENCOUNTER — Ambulatory Visit (INDEPENDENT_AMBULATORY_CARE_PROVIDER_SITE_OTHER): Payer: Medicare Other | Admitting: Pulmonary Disease

## 2021-04-01 VITALS — BP 138/70 | HR 83 | Temp 97.6°F | Ht 72.0 in | Wt 209.0 lb

## 2021-04-01 DIAGNOSIS — J849 Interstitial pulmonary disease, unspecified: Secondary | ICD-10-CM

## 2021-04-01 DIAGNOSIS — R0602 Shortness of breath: Secondary | ICD-10-CM | POA: Diagnosis not present

## 2021-04-01 DIAGNOSIS — J189 Pneumonia, unspecified organism: Secondary | ICD-10-CM

## 2021-04-01 DIAGNOSIS — R911 Solitary pulmonary nodule: Secondary | ICD-10-CM | POA: Diagnosis not present

## 2021-04-01 MED ORDER — LEVOFLOXACIN 750 MG PO TABS: 750.0000 mg | ORAL_TABLET | Freq: Every day | ORAL | 0 refills | Status: DC

## 2021-04-01 MED ORDER — PREDNISONE 20 MG PO TABS: 40.0000 mg | ORAL_TABLET | Freq: Every day | ORAL | 0 refills | Status: DC

## 2021-04-01 NOTE — Telephone Encounter (Signed)
Spoke with the pt  He is c/o increased SOB and back pain with taking a deep breath x 2 days  Back pain is new  Appt today at 3:30 pm with Dr Vaughan Browner today

## 2021-04-01 NOTE — Progress Notes (Signed)
Richard Parrish    [id number]    19-Aug-1950  Primary Care Physician:Polite, Jori Moll, MD  Referring Physician: Seward Carol, MD 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  Complains of chronic dyspnea on exertion, cough.  No fevers or chills  Imaging showed right upper lobe mass and he underwent combined bronchoscopy with Dr. Valeta Harms and subsequently right upper lobe robotic resection by Dr. Kipp Brood on 03/03/2021.   Pets: 3 cats Occupation: Used to work in Librarian, academic at [employer] Exposures: Exposed to [employer] carrier. Smoking history: 30-pack-year smoker.  Quit in 1989.  Currently vapes which helps with his back. Travel history: No significant recent travel history Relevant family history: No family history of lung disease  Interim history: Saw Dr. Julien Nordmann from oncology on 03/24/2021 and no further chemotherapy recommended  He is here for acute visit as he developed cough, dyspnea, wheezing and right chest pain Chest x-ray today reveals a dense right lower lobe pneumonia  Outpatient Encounter Medications as of 04/01/2021  Medication Sig   acetaminophen (TYLENOL) 500 MG tablet Take 500 mg by mouth every 6 (six) hours as needed (pain).   ALPRAZolam (XANAX) 0.5 MG tablet Take 0.25-0.5 mg by mouth 3 (three) times daily as needed.   atorvastatin (LIPITOR) 80 MG tablet TAKE 1 TABLET BY MOUTH EVERY DAY   budesonide (ENTOCORT EC) 3 MG 24 hr capsule Take 3-6 mg by mouth See admin instructions. Takes 2 capsules (6  mg) by mouth in the morning and take 1 capsule (3 mg) by mouth at night   cholecalciferol (VITAMIN D3) 25 MCG (1000 UNIT) tablet Take 1,000 Units by mouth in the morning.   clopidogrel (PLAVIX) 75 MG tablet TAKE 1 TABLET BY MOUTH EVERY DAY   gabapentin (NEURONTIN) 100 MG capsule Take 2 capsules (200 mg total) by mouth 3 (three) times daily.   Glucosamine HCl-MSM (GLUCOSAMINE-MSM PO) Take 1 tablet by mouth daily at 12 noon.   HYDROcodone-acetaminophen (NORCO) 7.5-325 MG tablet Take 1 tablet by mouth 5 (five) times daily.   hyoscyamine (LEVSIN, ANASPAZ) 0.125 MG tablet Take 0.125 mg by mouth every 4 (four) hours as needed for cramping.    isosorbide mononitrate (IMDUR) 30 MG 24 hr tablet Take 1 tablet (30 mg total) by mouth daily.   metaxalone (SKELAXIN) 800 MG tablet Take 800 mg by mouth 3 (three) times daily as needed for muscle spasms.   metoprolol succinate (TOPROL-XL) 50 MG 24 hr tablet TAKE 1 TABLET BY MOUTH EVERY DAY AFTER A MEAL (Patient taking differently: 25 mg daily.)   nitroGLYCERIN (NITROSTAT) 0.4 MG SL tablet Place 0.4 mg under the tongue every 5 (five) minutes as needed for chest pain.   pantoprazole (PROTONIX) 40 MG tablet TAKE 1 TABLET BY MOUTH DAILY BEFORE BREAKFAST   PARoxetine (PAXIL) 40 MG tablet Take 40 mg by mouth in the morning.   Probiotic Product (PROBIOTIC PO) Take 1 capsule by mouth daily.   tamsulosin (FLOMAX) 0.4 MG CAPS capsule Take 0.4 mg by mouth 2 (two) times daily.   valsartan (DIOVAN) 320 MG tablet Take  0.5 tablets (160 mg total) by mouth daily.   No facility-administered encounter medications on file as of 04/01/2021.    Physical Exam: Blood pressure 138/70, pulse 83, temperature 97.6 F (36.4 C), temperature source Oral, height 6' (1.829 m), weight 209 lb (94.8 kg), SpO2 91 %. Gen:      No acute distress HEENT:  EOMI, sclera anicteric Neck:     No masses; no thyromegaly Lungs:    Crackles in the right lower lobe CV:         Regular rate and rhythm; no  murmurs Abd:      + bowel sounds; soft, non-tender; no palpable masses, no distension Ext:    No edema; adequate peripheral perfusion Skin:      Warm and dry; no rash Neuro: alert and oriented x 3 Psych: normal mood and affect   Data Reviewed: Imaging: CT chest 07/18/2013-interstitial fibrosis with traction bronchiectasis with basilar predominance, emphysema.  Probable UIP pattern CT abdomen pelvis 02/07/17- basal pulmonary fibrosis. CT high-res 12/18/2020-new spiculated lung nodule in the right upper lobe PET scan 01/05/2021-uptake in the right upper lobe, equivocal uptake in the right hilum.  Left sphenoid sinus disease. Chest x-ray 04/01/2021-dense right lower lobe infiltrate I have reviewed the images personally  PFTs: 11/27/2020 FVC 4.03 [85%], FEV1 2.95 [84%], F/F 73, TLC 6.09 [81%], DLCO 20.01 [73%] Minimal diffusion defect.  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  Has slight progression on repeat CT scan Elevation in ANCA is likely nonspecific as he has no signs of connective tissue disease.  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   Plan/Recommendations: Levaquin, prednisone Follow-up in 2 weeks with x-ray Discussed path results at multidisciplinary ILD conference  Marshell Garfinkel MD [facility] 04/01/2021, 3:58 PM  CC:  Seward Carol, MD

## 2021-04-01 NOTE — Telephone Encounter (Signed)
Pt seen in office and MD aware of his cxr results from 04/01/21

## 2021-04-01 NOTE — Patient Instructions (Signed)
I have reviewed a chest x-ray which shows right lung pneumonia We will call in levofloxacin 750 mg a day for 7 days Prednisone 40 mg a day for 5 days Return to clinic in 2 weeks with follow-up chest x-ray  If that is any worsening then you will need to seek care in the nearest emergency room

## 2021-04-09 ENCOUNTER — Telehealth: Payer: Self-pay | Admitting: Pulmonary Disease

## 2021-04-09 DIAGNOSIS — J189 Pneumonia, unspecified organism: Secondary | ICD-10-CM

## 2021-04-09 MED ORDER — PREDNISONE 20 MG PO TABS: 20.0000 mg | ORAL_TABLET | Freq: Every day | ORAL | 0 refills | Status: DC

## 2021-04-09 MED ORDER — LEVOFLOXACIN 750 MG PO TABS: 750.0000 mg | ORAL_TABLET | Freq: Every day | ORAL | 0 refills | Status: DC

## 2021-04-09 NOTE — Telephone Encounter (Signed)
Called and spoke with patient. He verbalized understanding. Medications have been ordered. CT has been ordered.   Nothing further needed at time of call.

## 2021-04-09 NOTE — Telephone Encounter (Signed)
Please call in another round of levofloxacin 750 mg for 7 days and prednisone 40 mg/day for 7 days I am concerned about his persistent symptoms.  Please order a CT angiogram to be done before his return visit next week.

## 2021-04-09 NOTE — Telephone Encounter (Signed)
Called and spoke with patient. He stated that he was seen by Dr. Vaughan Browner on 04/01/21 for PNA. He has finished the prednisone and abx that Dr. Vaughan Browner sent in for him. He is still having the dull pain on the right side of his chest as well as his upper back on the right side.   He denied any fever, fatigue. He feels fine but is concerned about the pain. He wonders if he needs another round of prednisone and abx.   Pharmacy is [facility] in [place].   Dr. Vaughan Browner, can you please advise? Thanks!

## 2021-04-13 ENCOUNTER — Telehealth: Payer: Self-pay | Admitting: Pulmonary Disease

## 2021-04-13 ENCOUNTER — Emergency Department (HOSPITAL_COMMUNITY): Payer: PPO

## 2021-04-13 ENCOUNTER — Ambulatory Visit (HOSPITAL_COMMUNITY)
Admission: RE | Admit: 2021-04-13 | Discharge: 2021-04-13 | Disposition: A | Discharge status: Home / Self Care | Payer: PPO | Source: Ambulatory Visit | Attending: Pulmonary Disease | Admitting: Pulmonary Disease

## 2021-04-13 ENCOUNTER — Observation Stay (HOSPITAL_COMMUNITY): Payer: PPO

## 2021-04-13 ENCOUNTER — Observation Stay (HOSPITAL_COMMUNITY)
Admission: EM | Admit: 2021-04-13 | Discharge: 2021-04-14 | Disposition: A | Discharge status: Home / Self Care | Payer: PPO | Attending: Pulmonary Disease | Admitting: Pulmonary Disease

## 2021-04-13 ENCOUNTER — Other Ambulatory Visit: Payer: Self-pay

## 2021-04-13 ENCOUNTER — Encounter (HOSPITAL_COMMUNITY): Payer: Self-pay

## 2021-04-13 DIAGNOSIS — Z7902 Long term (current) use of antithrombotics/antiplatelets: Secondary | ICD-10-CM | POA: Diagnosis not present

## 2021-04-13 DIAGNOSIS — Z85118 Personal history of other malignant neoplasm of bronchus and lung: Secondary | ICD-10-CM | POA: Insufficient documentation

## 2021-04-13 DIAGNOSIS — J189 Pneumonia, unspecified organism: Secondary | ICD-10-CM | POA: Insufficient documentation

## 2021-04-13 DIAGNOSIS — Z8616 Personal history of COVID-19: Secondary | ICD-10-CM | POA: Diagnosis not present

## 2021-04-13 DIAGNOSIS — Z20822 Contact with and (suspected) exposure to covid-19: Secondary | ICD-10-CM | POA: Insufficient documentation

## 2021-04-13 DIAGNOSIS — R0602 Shortness of breath: Secondary | ICD-10-CM | POA: Diagnosis present

## 2021-04-13 DIAGNOSIS — J939 Pneumothorax, unspecified: Secondary | ICD-10-CM | POA: Diagnosis not present

## 2021-04-13 DIAGNOSIS — Z951 Presence of aortocoronary bypass graft: Secondary | ICD-10-CM | POA: Diagnosis not present

## 2021-04-13 DIAGNOSIS — Z902 Acquired absence of lung [part of]: Secondary | ICD-10-CM | POA: Diagnosis not present

## 2021-04-13 DIAGNOSIS — Z79899 Other long term (current) drug therapy: Secondary | ICD-10-CM | POA: Insufficient documentation

## 2021-04-13 DIAGNOSIS — Z87891 Personal history of nicotine dependence: Secondary | ICD-10-CM | POA: Diagnosis not present

## 2021-04-13 DIAGNOSIS — I2510 Atherosclerotic heart disease of native coronary artery without angina pectoris: Secondary | ICD-10-CM | POA: Diagnosis not present

## 2021-04-13 DIAGNOSIS — J9383 Other pneumothorax: Secondary | ICD-10-CM

## 2021-04-13 DIAGNOSIS — I10 Essential (primary) hypertension: Secondary | ICD-10-CM | POA: Insufficient documentation

## 2021-04-13 LAB — CBC WITH DIFFERENTIAL/PLATELET
Abs Immature Granulocytes: 0.16 10*3/uL — ABNORMAL HIGH (ref 0.00–0.07)
Basophils Absolute: 0 10*3/uL (ref 0.0–0.1)
Basophils Relative: 0 %
Eosinophils Absolute: 0 10*3/uL (ref 0.0–0.5)
Eosinophils Relative: 0 %
HCT: 47.5 % (ref 39.0–52.0)
Hemoglobin: 16.2 g/dL (ref 13.0–17.0)
Immature Granulocytes: 1 %
Lymphocytes Relative: 6 %
Lymphs Abs: 1.1 10*3/uL (ref 0.7–4.0)
MCH: 32.3 pg (ref 26.0–34.0)
MCHC: 34.1 g/dL (ref 30.0–36.0)
MCV: 94.6 fL (ref 80.0–100.0)
Monocytes Absolute: 0.6 10*3/uL (ref 0.1–1.0)
Monocytes Relative: 3 %
Neutro Abs: 16.2 10*3/uL — ABNORMAL HIGH (ref 1.7–7.7)
Neutrophils Relative %: 90 %
Platelets: 436 10*3/uL — ABNORMAL HIGH (ref 150–400)
RBC: 5.02 MIL/uL (ref 4.22–5.81)
RDW: 12.6 % (ref 11.5–15.5)
WBC: 18.1 10*3/uL — ABNORMAL HIGH (ref 4.0–10.5)
nRBC: 0 % (ref 0.0–0.2)

## 2021-04-13 LAB — COMPREHENSIVE METABOLIC PANEL
ALT: 33 U/L (ref 0–44)
AST: 28 U/L (ref 15–41)
Albumin: 4.4 g/dL (ref 3.5–5.0)
Alkaline Phosphatase: 84 U/L (ref 38–126)
Anion gap: 7 (ref 5–15)
BUN: 23 mg/dL (ref 8–23)
CO2: 30 mmol/L (ref 22–32)
Calcium: 9.5 mg/dL (ref 8.9–10.3)
Chloride: 97 mmol/L — ABNORMAL LOW (ref 98–111)
Creatinine, Ser: 0.79 mg/dL (ref 0.61–1.24)
GFR, Estimated: >60 mL/min (ref >60)
Glucose, Bld: 123 mg/dL — ABNORMAL HIGH (ref 70–99)
Potassium: 4.2 mmol/L (ref 3.5–5.1)
Sodium: 134 mmol/L — ABNORMAL LOW (ref 135–145)
Total Bilirubin: 0.8 mg/dL (ref 0.3–1.2)
Total Protein: 7.6 g/dL (ref 6.5–8.1)

## 2021-04-13 LAB — I-STAT CHEM 8, ED
BUN: 21 mg/dL (ref 8–23)
Calcium, Ion: 1.25 mmol/L (ref 1.15–1.40)
Chloride: 98 mmol/L (ref 98–111)
Creatinine, Ser: 0.8 mg/dL (ref 0.61–1.24)
Glucose, Bld: 116 mg/dL — ABNORMAL HIGH (ref 70–99)
HCT: 48 % (ref 39.0–52.0)
Hemoglobin: 16.3 g/dL (ref 13.0–17.0)
Potassium: 4.3 mmol/L (ref 3.5–5.1)
Sodium: 135 mmol/L (ref 135–145)
TCO2: 29 mmol/L (ref 22–32)

## 2021-04-13 MED ORDER — ALPRAZOLAM 0.5 MG PO TABS
0.5000 mg | ORAL_TABLET | Freq: Three times a day (TID) | ORAL | Status: DC | PRN
  Administered 2021-04-13: 0.5 mg via ORAL
  Filled 2021-04-13: qty 1

## 2021-04-13 MED ORDER — PANTOPRAZOLE SODIUM 40 MG PO TBEC
40.0000 mg | DELAYED_RELEASE_TABLET | Freq: Every day | ORAL | Status: DC
  Filled 2021-04-13: qty 1

## 2021-04-13 MED ORDER — ISOSORBIDE MONONITRATE ER 30 MG PO TB24
30.0000 mg | ORAL_TABLET | Freq: Every day | ORAL | Status: DC
  Administered 2021-04-14: 30 mg via ORAL
  Filled 2021-04-13: qty 1

## 2021-04-13 MED ORDER — METAXALONE 800 MG PO TABS
800.0000 mg | ORAL_TABLET | Freq: Three times a day (TID) | ORAL | Status: DC | PRN
  Administered 2021-04-13: 800 mg via ORAL
  Filled 2021-04-13 (×3): qty 1

## 2021-04-13 MED ORDER — SODIUM CHLORIDE (PF) 0.9 % IJ SOLN
INTRAMUSCULAR | Status: AC
  Filled 2021-04-13: qty 50

## 2021-04-13 MED ORDER — HYOSCYAMINE SULFATE 0.125 MG PO TABS
0.1250 mg | ORAL_TABLET | ORAL | Status: DC | PRN
  Filled 2021-04-13: qty 1

## 2021-04-13 MED ORDER — IRBESARTAN 75 MG PO TABS
37.5000 mg | ORAL_TABLET | Freq: Every day | ORAL | Status: DC
  Administered 2021-04-14: 37.5 mg via ORAL
  Filled 2021-04-13: qty 0.5

## 2021-04-13 MED ORDER — GABAPENTIN 100 MG PO CAPS
200.0000 mg | ORAL_CAPSULE | Freq: Three times a day (TID) | ORAL | Status: DC
  Administered 2021-04-13 – 2021-04-14 (×2): 200 mg via ORAL
  Filled 2021-04-13 (×2): qty 2

## 2021-04-13 MED ORDER — PROBIOTIC PO TBEC: DELAYED_RELEASE_TABLET | Freq: Every day | ORAL | Status: DC

## 2021-04-13 MED ORDER — ACETAMINOPHEN 500 MG PO TABS: 500.0000 mg | ORAL_TABLET | Freq: Four times a day (QID) | ORAL | Status: DC | PRN

## 2021-04-13 MED ORDER — GLUCOSAMINE-MSM 500-400 MG PO CAPS: ORAL_CAPSULE | Freq: Every day | ORAL | Status: DC

## 2021-04-13 MED ORDER — TAMSULOSIN HCL 0.4 MG PO CAPS
0.4000 mg | ORAL_CAPSULE | Freq: Two times a day (BID) | ORAL | Status: DC
  Administered 2021-04-13 – 2021-04-14 (×2): 0.4 mg via ORAL
  Filled 2021-04-13 (×2): qty 1

## 2021-04-13 MED ORDER — HYDROCODONE-ACETAMINOPHEN 10-325 MG PO TABS
1.0000 | ORAL_TABLET | ORAL | Status: DC | PRN
Start: 2021-04-13 — End: 2021-04-14
  Administered 2021-04-13 (×2): 1 via ORAL
  Filled 2021-04-13 (×2): qty 1

## 2021-04-13 MED ORDER — PAROXETINE HCL 20 MG PO TABS
40.0000 mg | ORAL_TABLET | Freq: Every day | ORAL | Status: DC
  Filled 2021-04-13: qty 2

## 2021-04-13 MED ORDER — ATORVASTATIN CALCIUM 40 MG PO TABS
80.0000 mg | ORAL_TABLET | Freq: Every day | ORAL | Status: DC
  Administered 2021-04-13 – 2021-04-14 (×2): 80 mg via ORAL
  Filled 2021-04-13 (×2): qty 2

## 2021-04-13 MED ORDER — METOPROLOL SUCCINATE ER 50 MG PO TB24
25.0000 mg | ORAL_TABLET | Freq: Every day | ORAL | Status: DC
  Administered 2021-04-14: 25 mg via ORAL
  Filled 2021-04-13: qty 1

## 2021-04-13 MED ORDER — RISAQUAD PO CAPS
1.0000 | ORAL_CAPSULE | Freq: Every day | ORAL | Status: DC
  Administered 2021-04-14: 1 via ORAL
  Filled 2021-04-13: qty 1

## 2021-04-13 MED ORDER — IOHEXOL 350 MG/ML SOLN
100.0000 mL | Freq: Once | INTRAVENOUS | Status: AC | PRN
  Administered 2021-04-13: 100 mL via INTRAVENOUS

## 2021-04-13 NOTE — Progress Notes (Signed)
PHARMACIST - PHYSICIAN ORDER COMMUNICATION  CONCERNING: P&T Medication Policy on Herbal Medications  DESCRIPTION:  This patients order for:  Glucosamine-MSM  has been noted.  This product(s) is classified as an herbal or natural product. Due to a lack of definitive safety studies or FDA approval, nonstandard manufacturing practices, plus the potential risk of unknown drug-drug interactions while on inpatient medications, the Pharmacy and Therapeutics Committee does not permit the use of herbal or natural products of this type within [facility].   ACTION TAKEN: The pharmacy department is unable to verify this order at this time and your patient has been informed of this safety policy. Please reevaluate patients clinical condition at discharge and address if the herbal or natural product(s) should be resumed at that time.  Peggyann Juba, PharmD, BCPS Pharmacy: [phone] 04/13/2021 6:24 PM

## 2021-04-13 NOTE — ED Triage Notes (Signed)
Patient was brought to the ED by CT. Patient has a right large pneumothorax.

## 2021-04-13 NOTE — Procedures (Signed)
Thoracentesis  Procedure Note  HARU SHAFF  [id number]  10-30-1950  Date:04/13/21  Time:6:00 PM   Provider Performing:Darnita Woodrum C Tamala Julian   Procedure: Thoracentesis with imaging guidance (08676)  Indication(s) Pneumothorax  Consent Risks of the procedure as well as the alternatives and risks of each were explained to the patient and/or caregiver.  Consent for the procedure was obtained and is signed in the bedside chart  Anesthesia Topical only with 1% lidocaine    Time Out Verified patient identification, verified procedure, site/side was marked, verified correct patient position, special equipment/implants available, medications/allergies/relevant history reviewed, required imaging and test results available.   Sterile Technique Maximal sterile technique including full sterile barrier drape, hand hygiene, sterile gown, sterile gloves, mask, hair covering, sterile ultrasound probe cover (if used).  Procedure Description Ultrasound was used to identify appropriate pleural anatomy for placement and overlying skin marked.  Area of drainage cleaned and draped in sterile fashion. Lidocaine was used to anesthetize the skin and subcutaneous tissue. ~400cc of air was drained from the right pleural space, procedure stopped after patient c/o severe pleurisy. Catheter then removed and bandaid applied to site.   Complications/Tolerance None; patient tolerated the procedure well. Chest X-ray is ordered to confirm no post-procedural complication.   EBL Minimal   Specimen(s) None

## 2021-04-13 NOTE — Telephone Encounter (Signed)
Call report received from [place], [place].  Patient CTA showing pneumothorax moderate to large.  Patient still at [facility]. Per Dr. Vaughan Browner- patient needs to be seen in ED.  Kim aware.

## 2021-04-13 NOTE — ED Provider Notes (Signed)
[facility] DEPT Provider Note   CSN: [id number] Arrival date & time: 04/13/21  1604     History  Chief Complaint  Patient presents with   Pneumnothroax    Richard Parrish is a 71 y.o. male history of interstitial lung disease, previous lung cancer status post resections here presenting with shortness of breath.  Patient has persistent shortness of breath since his lobectomy about a month ago.  He went to see pulmonary clinic today and had a stat CT that showed a large right pneumothorax.  Patient was sent here for chest tube and admission.  The history is provided by the patient.      Home Medications Prior to Admission medications   Medication Sig Start Date End Date Taking? Authorizing Provider  ALPRAZolam Duanne Moron) 0.5 MG tablet Take 0.25-0.5 mg by mouth 3 (three) times daily as needed for anxiety or sleep. 01/31/21  Yes [provider]  atorvastatin (LIPITOR) 80 MG tablet TAKE 1 TABLET BY MOUTH EVERY DAY 02/11/21  Yes Belva Crome, MD  cholecalciferol (VITAMIN D3) 25 MCG (1000 UNIT) tablet Take 1,000 Units by mouth in the morning.   Yes [provider]  clopidogrel (PLAVIX) 75 MG tablet TAKE 1 TABLET BY MOUTH EVERY DAY 08/27/20  Yes Waynetta Sandy, MD  gabapentin (NEURONTIN) 100 MG capsule Take 2 capsules (200 mg total) by mouth 3 (three) times daily. 03/07/21  Yes Lars Pinks M, PA-C  HYDROcodone-acetaminophen (NORCO) 10-325 MG tablet Take 1 tablet by mouth every 4 (four) hours as needed for moderate pain or severe pain. 04/10/21  Yes [provider]  hyoscyamine (LEVSIN, ANASPAZ) 0.125 MG tablet Take 0.125 mg by mouth every 4 (four) hours as needed for cramping.    Yes [provider]  isosorbide mononitrate (IMDUR) 30 MG 24 hr tablet Take 1 tablet (30 mg total) by mouth daily. Patient taking differently: Take 30 mg by mouth daily as needed (chest pain). 10/05/20 10/05/21 Yes Bhagat, Bhavinkumar, PA   levofloxacin (LEVAQUIN) 750 MG tablet Take 1 tablet (750 mg total) by mouth daily. 04/09/21  Yes Mannam, Praveen, MD  budesonide (ENTOCORT EC) 3 MG 24 hr capsule Take 3-6 mg by mouth See admin instructions. Takes 2 capsules (6 mg) by mouth in the morning and take 1 capsule (3 mg) by mouth at night    [provider]  metaxalone (SKELAXIN) 800 MG tablet Take 800 mg by mouth 3 (three) times daily as needed for muscle spasms.    [provider]  metoprolol succinate (TOPROL-XL) 50 MG 24 hr tablet TAKE 1 TABLET BY MOUTH EVERY DAY AFTER A MEAL Patient taking differently: 25 mg daily. 02/15/21   Belva Crome, MD  nitroGLYCERIN (NITROSTAT) 0.4 MG SL tablet Place 0.4 mg under the tongue every 5 (five) minutes as needed for chest pain.    [provider]  pantoprazole (PROTONIX) 40 MG tablet TAKE 1 TABLET BY MOUTH DAILY BEFORE BREAKFAST 01/01/21   Belva Crome, MD  PARoxetine (PAXIL) 40 MG tablet Take 40 mg by mouth in the morning. 01/05/15   [provider]  predniSONE (DELTASONE) 20 MG tablet Take 1 tablet (20 mg total) by mouth daily with breakfast. 04/09/21   Mannam, Hart Robinsons, MD  Probiotic Product (PROBIOTIC PO) Take 1 capsule by mouth daily.    [provider]  tamsulosin (FLOMAX) 0.4 MG CAPS capsule Take 0.4 mg by mouth 2 (two) times daily. 08/18/14   [provider]  valsartan (DIOVAN) 320  MG tablet Take 0.5 tablets (160 mg total) by mouth daily. 03/07/21   Nani Skillern, PA-C      Allergies    Ambien [zolpidem tartrate]    Review of Systems   Review of Systems  Respiratory:  Positive for shortness of breath.   All other systems reviewed and are negative.  Physical Exam Updated Vital Signs BP (!) 164/101    Pulse 66    Temp 98.3 F (36.8 C) (Oral)    Resp 20    Ht 6' (1.829 m)    Wt 94.8 kg    SpO2 99%    BMI 28.35 kg/m  Physical Exam Vitals and nursing note reviewed.  Constitutional:      Comments: Chronically ill, tachypneic    HENT:     Head: Normocephalic.     Nose: Nose normal.     Mouth/Throat:     Mouth: Mucous membranes are moist.  Eyes:     Extraocular Movements: Extraocular movements intact.     Pupils: Pupils are equal, round, and reactive to light.  Cardiovascular:     Rate and Rhythm: Normal rate and regular rhythm.     Pulses: Normal pulses.     Heart sounds: Normal heart sounds.  Pulmonary:     Comments: Absent breath sounds on the right side.  Normal breath sounds on the left side Abdominal:     General: Abdomen is flat.     Palpations: Abdomen is soft.  Musculoskeletal:        General: Normal range of motion.     Cervical back: Normal range of motion and neck supple.  Skin:    General: Skin is warm.     Capillary Refill: Capillary refill takes less than 2 seconds.  Neurological:     General: No focal deficit present.     Mental Status: He is oriented to person, place, and time.  Psychiatric:        Mood and Affect: Mood normal.        Behavior: Behavior normal.    ED Results / Procedures / Treatments   Labs (all labs ordered are listed, but only abnormal results are displayed) Labs Reviewed  CBC WITH DIFFERENTIAL/PLATELET - Abnormal; Notable for the following components:      Result Value   WBC 18.1 (*)    Platelets 436 (*)    Neutro Abs 16.2 (*)    Abs Immature Granulocytes 0.16 (*)    All other components within normal limits  COMPREHENSIVE METABOLIC PANEL - Abnormal; Notable for the following components:   Sodium 134 (*)    Chloride 97 (*)    Glucose, Bld 123 (*)    All other components within normal limits  I-STAT CHEM 8, ED - Abnormal; Notable for the following components:   Glucose, Bld 116 (*)    All other components within normal limits    EKG None  Radiology DG Chest 1 View  Result Date: 04/13/2021 CLINICAL DATA:  Known right-sided pneumothorax EXAM: CHEST  1 VIEW COMPARISON:  CT of earlier today and plain film of 04/01/2021 FINDINGS: Patient rotated right.  Mild cardiomegaly. Atherosclerosis in the transverse aorta. Large right-sided pneumothorax, relatively similar to the scout view from the CT of earlier today. Greater than 50% right-sided pleural air volume. No pleural fluid. Collapsed right lung. Mild interstitial lung disease involving the left lung base. Remote seventh posterior left rib fracture. IMPRESSION: Similar appearance of large right-sided pneumothorax since the CT of earlier today. Cardiomegaly  without congestive failure. Electronically Signed   By: Abigail Miyamoto M.D.   On: 04/13/2021 17:36   CT Angio Chest W/Cm &/Or Wo Cm  Result Date: 04/13/2021 CLINICAL DATA:  Short of breath. Lobectomy 4 non-small cell lung cancer. RIGHT upper lobectomy 03/02/2021. EXAM: CT ANGIOGRAPHY CHEST WITH CONTRAST TECHNIQUE: Multidetector CT imaging of the chest was performed using the standard protocol during bolus administration of intravenous contrast. Multiplanar CT image reconstructions and MIPs were obtained to evaluate the vascular anatomy. RADIATION DOSE REDUCTION: This exam was performed according to the departmental dose-optimization program which includes automated exposure control, adjustment of the mA and/or kV according to patient size and/or use of iterative reconstruction technique. CONTRAST:  137mL OMNIPAQUE IOHEXOL 350 MG/ML SOLN COMPARISON:  Chest radiograph 04/01/2021, CT chest 02/25/2021 FINDINGS: Cardiovascular: Post CABG. No pulmonary embolism. RIGHT upper lobectomy anatomy. Mediastinum/Nodes: No axillary or supraclavicular adenopathy. No mediastinal or hilar adenopathy. No pericardial fluid. Esophagus normal. Lungs/Pleura: Large RIGHT anterior and upper pneumothorax. Pneumothorax volume is large occupying proximally 50% of the hemithorax volume. The RIGHT middle lobe and RIGHT lower lobe are collapsed. Architectural distortion within the collapsed RIGHT lobes. No mediastinal shift compared to CT 02/25/2021. Fibrotic change at the LEFT lung base. No  LEFT pneumothorax. Upper Abdomen: Limited view of the liver, kidneys, pancreas are unremarkable. Normal adrenal glands. Musculoskeletal: No aggressive osseous lesion. Review of the MIP images confirms the above findings. IMPRESSION: 1. No acute pulmonary embolism. 2. Large volume RIGHT pneumothorax following RIGHT upper lobe lobectomy. Dense atelectasis of the RIGHT middle lobe and RIGHT lower lobe. No mediastinal shift. No pleural fluid. 3. No LEFT pneumothorax. Findings conveyed toPRAVEEN MANNAM on 04/13/2021 at15:52 by CT tech Adin Hector Electronically Signed   By: Suzy Bouchard M.D.   On: 04/13/2021 15:55   DG CHEST PORT 1 VIEW  Result Date: 04/13/2021 CLINICAL DATA:  Provided history: Pneumothorax. Additional history provided: Post thoracentesis. EXAM: PORTABLE CHEST 1 VIEW COMPARISON:  Prior chest radiographs 04/13/2021 and earlier. Chest CT 04/13/2021. FINDINGS: Prior median sternotomy/CABG. The right aspect of the cardiac silhouette is largely obscured, limiting evaluation of heart size. Prior right upper lobectomy. A previously demonstrated large right pneumothorax has near completely resolved. Only a trace residual right pneumothorax is questioned. Known basilar predominant fibrotic lung disease. Asymmetric opacity at the right lung base, which may reflect atelectasis and/or pneumonia. No definite pleural effusion. IMPRESSION: A previously demonstrated large right pneumothorax has near completely resolved. Only a trace residual right pneumothorax is questioned. Asymmetric opacity at the right lung base, which may reflect atelectasis and/or pneumonia. Prior right upper lobectomy. Known basilar-predominant fibrotic lung disease. Electronically Signed   By: Kellie Simmering D.O.   On: 04/13/2021 18:21    Procedures Procedures    CRITICAL CARE Performed by: Wandra Arthurs   Total critical care time: 30 minutes  Critical care time was exclusive of separately billable procedures and treating other  patients.  Critical care was necessary to treat or prevent imminent or life-threatening deterioration.  Critical care was time spent personally by me on the following activities: development of treatment plan with patient and/or surrogate as well as nursing, discussions with consultants, evaluation of patient's response to treatment, examination of patient, obtaining history from patient or surrogate, ordering and performing treatments and interventions, ordering and review of laboratory studies, ordering and review of radiographic studies, pulse oximetry and re-evaluation of patient's condition.   Medications Ordered in ED Medications  ALPRAZolam (XANAX) tablet 0.5 mg (has no administration in time range)  metaxalone (SKELAXIN) tablet 800 mg (has no administration in time range)  HYDROcodone-acetaminophen (NORCO) 10-325 MG per tablet 1 tablet (1 tablet Oral Given 04/13/21 1752)  acetaminophen (TYLENOL) tablet 500 mg (has no administration in time range)  atorvastatin (LIPITOR) tablet 80 mg (has no administration in time range)  gabapentin (NEURONTIN) capsule 200 mg (has no administration in time range)  hyoscyamine (LEVSIN) tablet 0.125 mg (has no administration in time range)  isosorbide mononitrate (IMDUR) 24 hr tablet 30 mg (has no administration in time range)  metoprolol succinate (TOPROL-XL) 24 hr tablet 25 mg (has no administration in time range)  pantoprazole (PROTONIX) EC tablet 40 mg (has no administration in time range)  PARoxetine (PAXIL) tablet 40 mg (has no administration in time range)  Probiotic TBEC (has no administration in time range)  tamsulosin (FLOMAX) capsule 0.4 mg (has no administration in time range)  irbesartan (AVAPRO) tablet 37.5 mg (has no administration in time range)    ED Course/ Medical Decision Making/ A&P                           Medical Decision Making JARROD MCENERY is a 71 y.o. male here presenting with shortness of breath.  Patient has a large  pneumothorax and CTA today.  He has no breath sounds on the right side.  Since patient is sent by pulmonology, I discussed with Dr. Tamala Julian from critical care to see patient and put in a chest tube  6 pm Dr. Tamala Julian discussed case with CT surgeon.  The decision was to do a thoracentesis instead of trying to do a chest tube.  After the thoracentesis, there is minimal pneumothorax now.  Critical care to admit for observation.   Problems Addressed: Pneumothorax: acute illness or injury  Amount and/or Complexity of Data Reviewed External Data Reviewed: notes. Labs: ordered. Decision-making details documented in ED Course. Radiology: ordered and independent interpretation performed. Decision-making details documented in ED Course.  Risk Decision regarding hospitalization.  Final Clinical Impression(s) / ED Diagnoses Final diagnoses:  Pneumothorax    Rx / DC Orders ED Discharge Orders     None         Drenda Freeze, MD 04/13/21 1940

## 2021-04-13 NOTE — H&P (Signed)
NAME:  Richard Parrish, MRN:  [id number], DOB:  10/28/1950, LOS: 0 ADMISSION DATE:  04/13/2021, CONSULTATION DATE:  2/14 REFERRING MD:  Dr. Darl Householder, CHIEF COMPLAINT:  Pneumothorax   History of Present Illness:  71 y/o M who presented to the [facility] ER on 2/14 after outpatient CT revealed a pneumothorax.    He has known ILD, followed by Dr. Vaughan Browner, with non-specific elevation of ANCA, no evidence of connective tissue disease.  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  In addition, he had a 2 cm RUL pulmonary nodule and on 03/03/21 underwent fiberoptic bronchoscopy with robotic assistance for lung biopsy, VATS thoracoscopy with RUL lobectomy, RLL wedge resection, lymph node biopsy and intercostal nerve block.  His post operative course was uneventful and CT's were able to be removed.  CT was removed on 1/6.  CXR on 1/7 showed no pneumothorax.  He was discharged on 1/8. 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 RLL wedge resection was consistent with acute inflammatory cells thought to be bronchopneumonia.  He saw Dr. Julien Nordmann and was recommended monitoring / observation.  The patient began noticing a need to clear his throat and some decrease in his activity tolerance > able to walk about 200 yards total while out feeding his birds. He called the office on 2/2 with complaints of shortness of breath, back pain after taking a deep breath for 48 hours. He followed up with Dr. Vaughan Browner on 2/2 in the clinic and was able to walk without desaturation. His CXR showed dense RLL consolidation.  He was treated with prednisone and levaquin for PNA.  He completed his abx and prednisone and called the office 2/10 with reports of ongoing dull ache and pain on the right side. He was prescribed repeat abx and prednisone.  CTA of the chest was completed 2/14 to evaluate for PE which was negative but showed a large right sided pnemothorax.   PCCM consulted for admission.   Pertinent  Medical History   CAD s/p MI CABG - DVG to RCA at at 14  HTN HLD  PAD / PVD  OSA on CPAP UIP / IPF  BPH C-Difficile  Osteoarthritis  COVID 04/2019  Former Smoker - quit [place] [facility] Events: Including procedures, antibiotic start and stop dates in addition to other pertinent events   2/14 Admit with pneumothorax   Interim History / Subjective:  As above   Objective   Blood pressure (!) 156/91, pulse 63, temperature 98.3 F (36.8 C), temperature source Oral, resp. rate 18, height 6' (1.829 m), weight 94.8 kg, SpO2 98 %.       No intake or output data in the 24 hours ending 04/13/21 1721 Filed Weights   04/13/21 1613  Weight: 94.8 kg    Examination: General: adult male lying in bed in NAD  HENT: MM pink/moist, anicteric, good dentition  Lungs: non-labored at rest, clear on the left, diminished on the right, able to speak full paragraphs without distress Cardiovascular: S1S2 RRR with PVC's, SR in 60's Abdomen: soft, non-tender, bsx4 active Extremities: warm/dry, no edema  Neuro: AAOx4, speech clear, MAE  Resolved Hospital Problem list     Assessment & Plan:   Large Right Pneumothorax  S/p needle decompression via thoracentesis with closed system -admit for observation  -follow up CXR post thoracentesis & in am  -care discussed with Dr. Valeta Harms and Dr. Kipp Brood  -PRN pain control  -obtain urgent CXR if new or worsening symptoms  -O2 at  4L for nitrogen washout  HTN, HLD, CAD -continue home medications > lipitor, irbesartan, imdur, toprol  -hold home plavix in the event he needs further procedures   BPH -continue flomax  Anxiety / Depression Chronic Pain  -continue xanax, paxil, neurontin   Best Practice (right click and "Reselect all SmartList Selections" daily)  Diet/type: Regular consistency (see orders) DVT prophylaxis: not indicated GI prophylaxis: PPI Lines: N/A Foley:  N/A Code Status:  full code Last date of multidisciplinary goals of care  discussion: pending   Labs   CBC: Recent Labs  Lab 04/13/21 1629 04/13/21 1639  WBC 18.1*  --   NEUTROABS 16.2*  --   HGB 16.2 16.3  HCT 47.5 48.0  MCV 94.6  --   PLT 436*  --     Basic Metabolic Panel: Recent Labs  Lab 04/13/21 1629 04/13/21 1639  NA 134* 135  K 4.2 4.3  CL 97* 98  CO2 30  --   GLUCOSE 123* 116*  BUN 23 21  CREATININE 0.79 0.80  CALCIUM 9.5  --    GFR: Estimated Creatinine Clearance: 99.8 mL/min (by C-G formula based on SCr of 0.8 mg/dL). Recent Labs  Lab 04/13/21 1629  WBC 18.1*    Liver Function Tests: Recent Labs  Lab 04/13/21 1629  AST 28  ALT 33  ALKPHOS 84  BILITOT 0.8  PROT 7.6  ALBUMIN 4.4   No results for input(s): LIPASE, AMYLASE in the last 168 hours. No results for input(s): AMMONIA in the last 168 hours.  ABG    Component Value Date/Time   PHART 7.402 03/02/2021 1124   PCO2ART 38.9 03/02/2021 1124   PO2ART 102 03/02/2021 1124   HCO3 23.7 03/02/2021 1124   TCO2 29 04/13/2021 1639   ACIDBASEDEF 0.5 03/02/2021 1124   O2SAT 97.6 03/02/2021 1124     Coagulation Profile: No results for input(s): INR, PROTIME in the last 168 hours.  Cardiac Enzymes: No results for input(s): CKTOTAL, CKMB, CKMBINDEX, TROPONINI in the last 168 hours.  HbA1C: Hgb A1c MFr Bld  Date/Time Value Ref Range Status  10/03/2020 07:49 PM 5.3 4.8 - 5.6 % Final    Comment:    (NOTE) Pre diabetes:          5.7%-6.4%  Diabetes:              >6.4%  Glycemic control for   <7.0% adults with diabetes   01/08/2018 08:21 AM 5.1 4.8 - 5.6 % Final    Comment:             Prediabetes: 5.7 - 6.4          Diabetes: >6.4          Glycemic control for adults with diabetes: <7.0     CBG: No results for input(s): GLUCAP in the last 168 hours.  Review of Systems: Positives in [place]   Gen: Denies fever, chills, weight change, fatigue, night sweats HEENT: Denies blurred vision, double vision, hearing loss, tinnitus, sinus congestion, rhinorrhea,  sore throat, neck stiffness, dysphagia PULM: Denies shortness of breath, cough, sputum production, hemoptysis, wheezing CV: Denies chest pain, edema, orthopnea, paroxysmal nocturnal dyspnea, palpitations GI: Denies abdominal pain, nausea, vomiting, diarrhea, hematochezia, melena, constipation, change in bowel habits GU: Denies dysuria, hematuria, polyuria, oliguria, urethral discharge Endocrine: Denies hot or cold intolerance, polyuria, polyphagia or appetite change Derm: Denies rash, dry skin, scaling or peeling skin change Heme: Denies easy bruising, bleeding, bleeding gums Neuro: Denies headache, numbness, weakness, slurred speech,  loss of memory or consciousness  Past Medical History:  He,  has a past medical history of Anxiety, Arthritis, BPH (benign prostatic hypertrophy), C. difficile diarrhea, CAD (coronary artery disease), Chronic lower back pain, Coronary artery disease (1989), Depression, GERD (gastroesophageal reflux disease), Hyperlipidemia, Hypertension, Insomnia, Leg pain, Myocardial infarction ([place]) (2002), OSA on CPAP, Osteoarthritis, PAD (peripheral artery disease) ([place]), Peripheral vascular disease ([place]), Pneumonia (1989), Pulmonary fibrosis ([place]), and Spinal stenosis.   Surgical History:   Past Surgical History:  Procedure Laterality Date   ABDOMINAL AORTAGRAM N/A 03/15/2011   Procedure: ABDOMINAL AORTAGRAM;  Surgeon: Serafina Mitchell, MD;  Location: [facility] CATH LAB;  Service: Cardiovascular;  Laterality: N/A;   ABDOMINAL AORTAGRAM N/A 04/12/2011   Procedure: ABDOMINAL Maxcine Ham;  Surgeon: Serafina Mitchell, MD;  Location: [facility] CATH LAB;  Service: Cardiovascular;  Laterality: N/A;   BACK SURGERY     BRONCHIAL BIOPSY  03/03/2021   Procedure: BRONCHIAL BIOPSIES;  Surgeon: Garner Nash, DO;  Location: [facility];  Service: Pulmonary;;   BRONCHIAL BRUSHINGS  03/03/2021   Procedure: BRONCHIAL BRUSHINGS;  Surgeon: Garner Nash, DO;  Location: [place];  Service: Pulmonary;;    BRONCHIAL NEEDLE ASPIRATION BIOPSY  03/03/2021   Procedure: BRONCHIAL NEEDLE ASPIRATION BIOPSIES;  Surgeon: Garner Nash, DO;  Location: [facility];  Service: Pulmonary;;   CARDIAC CATHETERIZATION  08/1987   CORONARY ANGIOPLASTY WITH STENT PLACEMENT  ~ 2002   [facility], [place]   CORONARY ARTERY BYPASS GRAFT  09/1987   "CABG X1";  [facility]; [place]; "RCA"   FIDUCIAL MARKER PLACEMENT  03/03/2021   Procedure: FIDUCIAL DYE MARKING;  Surgeon: Garner Nash, DO;  Location: [facility] [facility];  Service: Pulmonary;;   INTERCOSTAL NERVE BLOCK  03/03/2021   Procedure: INTERCOSTAL NERVE BLOCK;  Surgeon: Lajuana Matte, MD;  Location: [place];  Service: Thoracic;;   JOINT REPLACEMENT     KNEE ARTHROSCOPY Left    LAPAROSCOPIC CHOLECYSTECTOMY     LYMPH NODE BIOPSY  03/03/2021   Procedure: LYMPH NODE BIOPSY;  Surgeon: Lajuana Matte, MD;  Location: [place];  Service: Thoracic;;   LYMPH NODE DISSECTION  03/03/2021   Procedure: LYMPH NODE DISSECTION;  Surgeon: Lajuana Matte, MD;  Location: [place];  Service: Thoracic;;   PERIPHERAL VASCULAR INTERVENTION Left 2016   "put a stent; right branch of left femoral"   POSTERIOR LUMBAR FUSION  2009   lumbar fusion, S L 5 to L 4   RIGHT/LEFT HEART CATH AND CORONARY/GRAFT ANGIOGRAPHY N/A 10/05/2020   Procedure: RIGHT/LEFT HEART CATH AND CORONARY/GRAFT ANGIOGRAPHY;  Surgeon: Nelva Bush, MD;  Location: [facility];  Service: Cardiovascular;  Laterality: N/A;   SHOULDER ARTHROSCOPY W/ ROTATOR CUFF REPAIR Right 2006   SHOULDER ARTHROSCOPY WITH SUBACROMIAL DECOMPRESSION AND OPEN ROTATOR C Left ~ [place]     TOTAL HIP ARTHROPLASTY Right 2012   TOTAL HIP ARTHROPLASTY Left 04/07/2017   TOTAL HIP ARTHROPLASTY Left 04/07/2017   Procedure: LEFT TOTAL HIP ARTHROPLASTY;  Surgeon: Earlie Server, MD;  Location: [facility];  Service: Orthopedics;  Laterality: Left;   VIDEO BRONCHOSCOPY WITH RADIAL ENDOBRONCHIAL  ULTRASOUND  03/03/2021   Procedure: VIDEO BRONCHOSCOPY WITH RADIAL ENDOBRONCHIAL ULTRASOUND;  Surgeon: Garner Nash, DO;  Location: [facility];  Service: Pulmonary;;     Social History:   reports that he quit smoking about 34 years ago. His smoking use included cigarettes. He has a 30.00 pack-year smoking history. He has never used smokeless tobacco.  He reports current alcohol use. He reports that he does not use drugs.   Family History:  His family history includes Cancer in his father; Heart disease in his mother.   Allergies Allergies  Allergen Reactions   Ambien [Zolpidem Tartrate] Other (See Comments)    Bad dreams/Vivid Dreams     Home Medications  Prior to Admission medications   Medication Sig Start Date End Date Taking? Authorizing Provider  acetaminophen (TYLENOL) 500 MG tablet Take 500 mg by mouth every 6 (six) hours as needed (pain).    [provider]  ALPRAZolam Duanne Moron) 0.5 MG tablet Take 0.25-0.5 mg by mouth 3 (three) times daily as needed. 01/31/21   [provider]  atorvastatin (LIPITOR) 80 MG tablet TAKE 1 TABLET BY MOUTH EVERY DAY 02/11/21   Belva Crome, MD  budesonide (ENTOCORT EC) 3 MG 24 hr capsule Take 3-6 mg by mouth See admin instructions. Takes 2 capsules (6 mg) by mouth in the morning and take 1 capsule (3 mg) by mouth at night    [provider]  cholecalciferol (VITAMIN D3) 25 MCG (1000 UNIT) tablet Take 1,000 Units by mouth in the morning.    [provider]  clopidogrel (PLAVIX) 75 MG tablet TAKE 1 TABLET BY MOUTH EVERY DAY 08/27/20   Waynetta Sandy, MD  gabapentin (NEURONTIN) 100 MG capsule Take 2 capsules (200 mg total) by mouth 3 (three) times daily. 03/07/21   Nani Skillern, PA-C  Glucosamine HCl-MSM (GLUCOSAMINE-MSM PO) Take 1 tablet by mouth daily at 12 noon.    [provider]  HYDROcodone-acetaminophen (NORCO) 7.5-325 MG tablet Take 1 tablet by mouth 5 (five) times daily. 11/12/20    [provider]  hyoscyamine (LEVSIN, ANASPAZ) 0.125 MG tablet Take 0.125 mg by mouth every 4 (four) hours as needed for cramping.     [provider]  isosorbide mononitrate (IMDUR) 30 MG 24 hr tablet Take 1 tablet (30 mg total) by mouth daily. 10/05/20 10/05/21  [name], PA  levofloxacin (LEVAQUIN) 750 MG tablet Take 1 tablet (750 mg total) by mouth daily. 04/09/21   Mannam, Hart Robinsons, MD  metaxalone (SKELAXIN) 800 MG tablet Take 800 mg by mouth 3 (three) times daily as needed for muscle spasms.    [provider]  metoprolol succinate (TOPROL-XL) 50 MG 24 hr tablet TAKE 1 TABLET BY MOUTH EVERY DAY AFTER A MEAL Patient taking differently: 25 mg daily. 02/15/21   Belva Crome, MD  nitroGLYCERIN (NITROSTAT) 0.4 MG SL tablet Place 0.4 mg under the tongue every 5 (five) minutes as needed for chest pain.    [provider]  pantoprazole (PROTONIX) 40 MG tablet TAKE 1 TABLET BY MOUTH DAILY BEFORE BREAKFAST 01/01/21   Belva Crome, MD  PARoxetine (PAXIL) 40 MG tablet Take 40 mg by mouth in the morning. 01/05/15   [provider]  predniSONE (DELTASONE) 20 MG tablet Take 1 tablet (20 mg total) by mouth daily with breakfast. 04/09/21   Mannam, Hart Robinsons, MD  Probiotic Product (PROBIOTIC PO) Take 1 capsule by mouth daily.    [provider]  tamsulosin (FLOMAX) 0.4 MG CAPS capsule Take 0.4 mg by mouth 2 (two) times daily. 08/18/14   [provider]  valsartan (DIOVAN) 320 MG tablet Take 0.5 tablets (160 mg total) by mouth daily. 03/07/21   Nani Skillern, PA-C     Critical care time: n/a    Noe Gens, MSN, APRN, NP-C, AGACNP-BC [facility] 04/13/2021,  5:21 PM   Please see [email or web address] for pager details.   From 7A-7P if no response, please call [phone] After hours, please call ELink [phone]

## 2021-04-14 ENCOUNTER — Other Ambulatory Visit: Payer: Self-pay | Admitting: *Deleted

## 2021-04-14 ENCOUNTER — Telehealth: Payer: Self-pay | Admitting: Pulmonary Disease

## 2021-04-14 ENCOUNTER — Observation Stay: Payer: PPO

## 2021-04-14 ENCOUNTER — Observation Stay (HOSPITAL_COMMUNITY): Payer: PPO

## 2021-04-14 DIAGNOSIS — J9312 Secondary spontaneous pneumothorax: Secondary | ICD-10-CM | POA: Diagnosis not present

## 2021-04-14 DIAGNOSIS — J9383 Other pneumothorax: Secondary | ICD-10-CM

## 2021-04-14 LAB — RESP PANEL BY RT-PCR (FLU A&B, COVID) ARPGX2
Influenza A by PCR: NEGATIVE
Influenza B by PCR: NEGATIVE
SARS Coronavirus 2 by RT PCR: NEGATIVE

## 2021-04-14 MED ORDER — LEVOFLOXACIN 750 MG PO TABS
750.0000 mg | ORAL_TABLET | Freq: Once | ORAL | Status: AC
  Administered 2021-04-14: 750 mg via ORAL
  Filled 2021-04-14: qty 1

## 2021-04-14 NOTE — Discharge Summary (Signed)
Physician Discharge Summary         Patient ID: QUIN MATHENIA MRN: [id number] DOB/AGE: [id number] 72 y.o.  Admit date: 04/13/2021 Discharge date: 04/14/2021  Discharge Diagnoses:    Active Hospital Problems   Diagnosis Date Noted   Pneumothorax 04/13/2021    Resolved Hospital Problems  No resolved problems to display.     Discharge summary    71 y/o M who presented to the [facility] ER on 2/14 after outpatient CT revealed a pneumothorax.     He has known ILD, followed by Dr. Vaughan Browner, with non-specific elevation of ANCA, no evidence of connective tissue disease.  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  In addition, he had a 2 cm RUL pulmonary nodule and on 03/03/21 underwent fiberoptic bronchoscopy with robotic assistance for lung biopsy, VATS thoracoscopy with RUL lobectomy, RLL wedge resection, lymph node biopsy and intercostal nerve block.  His post operative course was uneventful and CT's were able to be removed.  CT was removed on 1/6.  CXR on 1/7 showed no pneumothorax.  He was discharged on 1/8. 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 RLL wedge resection was consistent with acute inflammatory cells thought to be bronchopneumonia.  He saw Dr. Julien Nordmann and was recommended monitoring / observation.   The patient began noticing a need to clear his throat and some decrease in his activity tolerance > able to walk about 200 yards total while out feeding his birds. He called the office on 2/2 with complaints of shortness of breath, back pain after taking a deep breath for 48 hours. He followed up with Dr. Vaughan Browner on 2/2 in the clinic and was able to walk without desaturation. His CXR showed dense RLL consolidation.  He was treated with prednisone and levaquin for PNA.  He completed his abx and prednisone and called the office 2/10 with reports of ongoing dull ache and pain on the right side. He was prescribed repeat abx and prednisone.  CTA of the chest was  completed 2/14 to evaluate for PE which was negative but showed a large right sided pnemothorax.      Discharge Plan by Active Problems    Patient presented to the hospital with a large right-sided pneumothorax.  Presume related to he has a recent thoracic surgery with lobectomy.  Patient underwent aspiration and was admitted for observation.  He remained in the emergency department overnight.  Repeat chest x-ray this morning shows resolution of pneumothorax.  Patient will discharge from the ER today with plans for follow-up in the pulmonary office tomorrow. He needs to show up to his appointment 30 minutes before for repeat two-view chest x-ray.  Significant Hospital tests/ studies   Procedures    Aspiration of right-sided pneumothorax  Culture data/antimicrobials      Consults  Pulmonary    Discharge Exam: BP 118/78    Pulse 64    Temp 98.3 F (36.8 C) (Oral)    Resp 17    Ht 6' (1.829 m)    Wt 94.8 kg    SpO2 98%    BMI 28.35 kg/m   General: Elderly male resting comfortably in bed. HEENT: Tracking appropriately, NCAT Lungs: Bibasilar inspiratory crackles Heart: Regular rhythm S1-S2 Abdomen: Soft nontender nondistended  Labs: Reviewed  Chest x-ray: Reexpansion of the right-sided pneumothorax, evidence of chronic lung disease and basilar fibrosis. The patient's images have been independently reviewed by me.    Labs at discharge   Lab Results  Component Value  Date   CREATININE 0.80 04/13/2021   BUN 21 04/13/2021   NA 135 04/13/2021   K 4.3 04/13/2021   CL 98 04/13/2021   CO2 30 04/13/2021   Lab Results  Component Value Date   WBC 18.1 (H) 04/13/2021   HGB 16.3 04/13/2021   HCT 48.0 04/13/2021   MCV 94.6 04/13/2021   PLT 436 (H) 04/13/2021   Lab Results  Component Value Date   ALT 33 04/13/2021   AST 28 04/13/2021   ALKPHOS 84 04/13/2021   BILITOT 0.8 04/13/2021   Lab Results  Component Value Date   INR 0.9 03/02/2021   INR 1.0 10/03/2020   INR  1.00 03/24/2017    Current radiological studies    DG Chest 1 View  Result Date: 04/13/2021 CLINICAL DATA:  Known right-sided pneumothorax EXAM: CHEST  1 VIEW COMPARISON:  CT of earlier today and plain film of 04/01/2021 FINDINGS: Patient rotated right. Mild cardiomegaly. Atherosclerosis in the transverse aorta. Large right-sided pneumothorax, relatively similar to the scout view from the CT of earlier today. Greater than 50% right-sided pleural air volume. No pleural fluid. Collapsed right lung. Mild interstitial lung disease involving the left lung base. Remote seventh posterior left rib fracture. IMPRESSION: Similar appearance of large right-sided pneumothorax since the CT of earlier today. Cardiomegaly without congestive failure. Electronically Signed   By: Abigail Miyamoto M.D.   On: 04/13/2021 17:36   CT Angio Chest W/Cm &/Or Wo Cm  Result Date: 04/13/2021 CLINICAL DATA:  Short of breath. Lobectomy 4 non-small cell lung cancer. RIGHT upper lobectomy 03/02/2021. EXAM: CT ANGIOGRAPHY CHEST WITH CONTRAST TECHNIQUE: Multidetector CT imaging of the chest was performed using the standard protocol during bolus administration of intravenous contrast. Multiplanar CT image reconstructions and MIPs were obtained to evaluate the vascular anatomy. RADIATION DOSE REDUCTION: This exam was performed according to the departmental dose-optimization program which includes automated exposure control, adjustment of the mA and/or kV according to patient size and/or use of iterative reconstruction technique. CONTRAST:  176mL OMNIPAQUE IOHEXOL 350 MG/ML SOLN COMPARISON:  Chest radiograph 04/01/2021, CT chest 02/25/2021 FINDINGS: Cardiovascular: Post CABG. No pulmonary embolism. RIGHT upper lobectomy anatomy. Mediastinum/Nodes: No axillary or supraclavicular adenopathy. No mediastinal or hilar adenopathy. No pericardial fluid. Esophagus normal. Lungs/Pleura: Large RIGHT anterior and upper pneumothorax. Pneumothorax volume is  large occupying proximally 50% of the hemithorax volume. The RIGHT middle lobe and RIGHT lower lobe are collapsed. Architectural distortion within the collapsed RIGHT lobes. No mediastinal shift compared to CT 02/25/2021. Fibrotic change at the LEFT lung base. No LEFT pneumothorax. Upper Abdomen: Limited view of the liver, kidneys, pancreas are unremarkable. Normal adrenal glands. Musculoskeletal: No aggressive osseous lesion. Review of the MIP images confirms the above findings. IMPRESSION: 1. No acute pulmonary embolism. 2. Large volume RIGHT pneumothorax following RIGHT upper lobe lobectomy. Dense atelectasis of the RIGHT middle lobe and RIGHT lower lobe. No mediastinal shift. No pleural fluid. 3. No LEFT pneumothorax. Findings conveyed toPRAVEEN MANNAM on 04/13/2021 at15:52 by CT tech Adin Hector Electronically Signed   By: Suzy Bouchard M.D.   On: 04/13/2021 15:55   Portable chest 1 View  Result Date: 04/14/2021 CLINICAL DATA:  Pneumothorax. EXAM: PORTABLE CHEST 1 VIEW COMPARISON:  04/13/2021. FINDINGS: The heart is obscured and mediastinal contours are stable. There is evidence of prior cardiothoracic surgery. Patchy airspace disease is present at the lung bases bilaterally and there are small bilateral pleural effusions. Surgical changes are noted in the mid right lung. No pneumothorax is seen. No  acute osseous abnormality. IMPRESSION: 1. The previously described pneumothorax is no longer seen. 2. Patchy atelectasis or infiltrate at the lung bases with small bilateral pleural effusions. Electronically Signed   By: Brett Fairy M.D.   On: 04/14/2021 05:00   DG CHEST PORT 1 VIEW  Result Date: 04/13/2021 CLINICAL DATA:  Provided history: Pneumothorax. Additional history provided: Post thoracentesis. EXAM: PORTABLE CHEST 1 VIEW COMPARISON:  Prior chest radiographs 04/13/2021 and earlier. Chest CT 04/13/2021. FINDINGS: Prior median sternotomy/CABG. The right aspect of the cardiac silhouette is largely  obscured, limiting evaluation of heart size. Prior right upper lobectomy. A previously demonstrated large right pneumothorax has near completely resolved. Only a trace residual right pneumothorax is questioned. Known basilar predominant fibrotic lung disease. Asymmetric opacity at the right lung base, which may reflect atelectasis and/or pneumonia. No definite pleural effusion. IMPRESSION: A previously demonstrated large right pneumothorax has near completely resolved. Only a trace residual right pneumothorax is questioned. Asymmetric opacity at the right lung base, which may reflect atelectasis and/or pneumonia. Prior right upper lobectomy. Known basilar-predominant fibrotic lung disease. Electronically Signed   By: Kellie Simmering D.O.   On: 04/13/2021 18:21    Disposition:    Discharge disposition: 01-Home or Self Care       Discharge Instructions     Call MD for:   Complete by: As directed    Worsening or change in your breathing   Diet - low sodium heart healthy   Complete by: As directed    Increase activity slowly   Complete by: As directed        Allergies as of 04/14/2021       Reactions   Ambien [zolpidem Tartrate] Other (See Comments)   Bad dreams/Vivid Dreams        Medication List     TAKE these medications    ALPRAZolam 0.5 MG tablet Commonly known as: XANAX Take 0.25-0.5 mg by mouth 3 (three) times daily as needed for anxiety or sleep.   atorvastatin 80 MG tablet Commonly known as: LIPITOR TAKE 1 TABLET BY MOUTH EVERY DAY   budesonide 3 MG 24 hr capsule Commonly known as: ENTOCORT EC Take 3-6 mg by mouth See admin instructions. Takes 2 capsules (6 mg) by mouth in the morning and take 1 capsule (3 mg) by mouth at night   cholecalciferol 25 MCG (1000 UNIT) tablet Commonly known as: VITAMIN D3 Take 1,000 Units by mouth in the morning.   clopidogrel 75 MG tablet Commonly known as: PLAVIX TAKE 1 TABLET BY MOUTH EVERY DAY   gabapentin 100 MG  capsule Commonly known as: NEURONTIN Take 2 capsules (200 mg total) by mouth 3 (three) times daily.   HYDROcodone-acetaminophen 10-325 MG tablet Commonly known as: NORCO Take 1 tablet by mouth every 4 (four) hours as needed for moderate pain or severe pain.   hyoscyamine 0.125 MG tablet Commonly known as: LEVSIN Take 0.125 mg by mouth every 4 (four) hours as needed for cramping.   isosorbide mononitrate 30 MG 24 hr tablet Commonly known as: IMDUR Take 1 tablet (30 mg total) by mouth daily. What changed:  when to take this reasons to take this   levofloxacin 750 MG tablet Commonly known as: LEVAQUIN Take 1 tablet (750 mg total) by mouth daily.   metaxalone 800 MG tablet Commonly known as: SKELAXIN Take 800 mg by mouth 3 (three) times daily as needed for muscle spasms.   metoprolol succinate 50 MG 24 hr tablet Commonly known as: TOPROL-XL TAKE  1 TABLET BY MOUTH EVERY DAY AFTER A MEAL What changed: See the new instructions.   nitroGLYCERIN 0.4 MG SL tablet Commonly known as: NITROSTAT Place 0.4 mg under the tongue every 5 (five) minutes as needed for chest pain.   pantoprazole 40 MG tablet Commonly known as: PROTONIX TAKE 1 TABLET BY MOUTH DAILY BEFORE BREAKFAST   PARoxetine 40 MG tablet Commonly known as: PAXIL Take 40 mg by mouth in the morning.   predniSONE 20 MG tablet Commonly known as: DELTASONE Take 1 tablet (20 mg total) by mouth daily with breakfast.   PROBIOTIC PO Take 1 capsule by mouth daily.   tamsulosin 0.4 MG Caps capsule Commonly known as: FLOMAX Take 0.4 mg by mouth 2 (two) times daily.   valsartan 320 MG tablet Commonly known as: DIOVAN Take 0.5 tablets (160 mg total) by mouth daily.         Follow-up appointment    Dr. Vaughan Browner, tomorrow at [facility] pulmonary  Discharge Condition:    good   Garner Nash, DO [facility] 04/14/2021 10:41 AM

## 2021-04-14 NOTE — ED Notes (Signed)
When getting am labs, the patient told me that he has been asking for his prednisone all day and night and noone was helping him.  I paged Dr Oletta Darter, critical care, and spoke to him about the patient The pt was placed on prednisone and levaquin on 2-10, both for 7 days, it appears that when he was admitted these were left off his med list, his last dose being on 2-13 Pt is very concerned about not having the prednisone and states that he has been having the shakes and sweating some and thinks it's prednisone withdrawal. Dr Oletta Darter did order a dose of levaquin for today 2-15, but states the prednisone needs to addressed by the am rounding critical care physicians.  This plan was discussed with the patient and he was given the dose of levaquin. Pt states that his wife was coming up to visit and bringing his medications with her.

## 2021-04-14 NOTE — Telephone Encounter (Signed)
PCCM:  Patient being discharged from the ER today at [facility] long.  I have ordered a two-view chest x-ray to be completed prior to the patient's appointment.  Patient was reminded to come in 30 minutes prior for chest x-ray.  Garner Nash, DO [facility] 04/14/2021 10:43 AM

## 2021-04-14 NOTE — Progress Notes (Signed)
[facility] Progress Note Patient Name: TAG WURTZ DOB: [birth year] MRN: [id number]   Date of Service  04/14/2021  HPI/Events of Note  Patient was on Levaquin and Prednisone as an outpatient for pneumonia neither of which have been continued since his hospital admission. Reading the H&P sheds no insight into whether this is intentional or an oversight.   eICU Interventions  Plan: Levaquin 750 mg PO X 1 now. Defer continuation of Levaquin and Prednisone to the PCCM rounding team in the AM.      Intervention Category Major Interventions: Other:  Zeddie Njie Cornelia Copa 04/14/2021, 4:20 AM

## 2021-04-15 ENCOUNTER — Other Ambulatory Visit: Payer: Self-pay

## 2021-04-15 ENCOUNTER — Encounter: Payer: Self-pay | Admitting: Pulmonary Disease

## 2021-04-15 ENCOUNTER — Ambulatory Visit (INDEPENDENT_AMBULATORY_CARE_PROVIDER_SITE_OTHER): Payer: PPO

## 2021-04-15 ENCOUNTER — Ambulatory Visit (INDEPENDENT_AMBULATORY_CARE_PROVIDER_SITE_OTHER): Payer: PPO | Admitting: Pulmonary Disease

## 2021-04-15 VITALS — BP 132/60 | HR 103 | Temp 97.6°F | Ht 72.0 in | Wt 205.4 lb

## 2021-04-15 DIAGNOSIS — C3411 Malignant neoplasm of upper lobe, right bronchus or lung: Secondary | ICD-10-CM | POA: Diagnosis not present

## 2021-04-15 DIAGNOSIS — J9383 Other pneumothorax: Secondary | ICD-10-CM | POA: Diagnosis not present

## 2021-04-15 DIAGNOSIS — J849 Interstitial pulmonary disease, unspecified: Secondary | ICD-10-CM | POA: Diagnosis not present

## 2021-04-15 NOTE — Progress Notes (Signed)
Insertion of Chest Tube Procedure Note  Richard Parrish  [id number]  09-08-1950  Date:04/15/21  Time:3:42 PM    Provider Performing: Octavio Graves [name]   Procedure: Pleural Catheter Insertion w/o Imaging Guidance [phone])  Indication(s) Pneumothorax  Consent Risks of the procedure as well as the alternatives and risks of each were explained to the patient and/or caregiver.  Consent for the procedure was obtained and is signed in the bedside chart  Anesthesia Topical only with 1% lidocaine   Time Out Verified patient identification, verified procedure, site/side was marked, verified correct patient position, special equipment/implants available, medications/allergies/relevant history reviewed, required imaging and test results available.  Sterile Technique Maximal sterile technique including full sterile barrier drape, hand hygiene, sterile gown, sterile gloves, mask, hair covering, sterile ultrasound probe cover (if used).  Procedure Description Ultrasound used to identify appropriate pleural anatomy for placement and overlying skin marked. Area of placement cleaned and draped in sterile fashion.  A 14 French pigtail pleural catheter was placed into the right pleural space using Seldinger technique. Appropriate return of air was obtained.  The tube was connected to hemlich one way valve  Complications/Tolerance None; patient tolerated the procedure well. Chest X-ray is ordered to verify placement.  EBL Minimal  Specimen(s) none  Garner Nash, DO [facility] 04/15/2021 3:43 PM

## 2021-04-15 NOTE — Patient Instructions (Signed)
You have a follow-up appointment with Dr. Kipp Brood next week.  We will get a chest x-ray at that time Keep your appointment with me in March and then we will determine when the chest tube can come out.

## 2021-04-15 NOTE — Progress Notes (Signed)
Richard Parrish    [id number]    Sep 19, 1950  Primary Care Physician:Polite, Jori Moll, MD  Referring Physician: Seward Carol, MD [address] [place],  [place] [address]  Problem list: Follow up for interstitial lung disease Right upper lobe adenocarcinoma status post bronchoscopy and robotic resection by Drs Valeta Harms and Kipp Brood on 56/91  HPI: 71 year old with history of pulmonary fibrosis, GERD, hypertension, hyperlipidemia, pulmonary fibrosis He was diagnosed with unspecified pulmonary fibrosis and was seen in pulmonary clinic in 2015  Developed COVID-19 in March 2021 and reports worsening dyspnea since then.  Complains of chronic dyspnea on exertion, cough.  No fevers or chills  Imaging showed right upper lobe mass and he underwent combined bronchoscopy with Dr. Valeta Harms and subsequently right upper lobe robotic resection by Dr. Kipp Brood on 03/03/2021.  Saw Dr. Julien Nordmann from oncology on 03/24/2021 and no further chemotherapy recommended  Pets: 3 cats Occupation: Used to work in Librarian, academic at [employer]: Exposed to [employer] carrier. Smoking history: 30-pack-year smoker.  Quit in 1989.  Currently vapes which helps with his back. Travel history: No significant recent travel history Relevant family history: No family history of lung disease  Interim history: He was seen in pulmonary clinic in early January with dense right lower lobe pneumonia which was treated with levofloxacin and prednisone CT obtained due to persistent symptoms showed a large right pneumothorax and he went to the ED on 2/14 with needle evacuation. Chest x-ray today in clinic shows recurrence of right pneumothorax.  He does note sudden onset of symptoms today after lunch  He underwent pigtail placement with Heimlich valve in the office today by Dr. Valeta Harms.  Follow-up chest x-ray shows resolution of pneumothorax.  Outpatient Encounter Medications as of 04/15/2021   Medication Sig   ALPRAZolam (XANAX) 0.5 MG tablet Take 0.25-0.5 mg by mouth 3 (three) times daily as needed for anxiety or sleep.   atorvastatin (LIPITOR) 80 MG tablet TAKE 1 TABLET BY MOUTH EVERY DAY   budesonide (ENTOCORT EC) 3 MG 24 hr capsule Take 3-6 mg by mouth See admin instructions. Takes 2 capsules (6 mg) by mouth in the morning and take 1 capsule (3 mg) by mouth at night   cholecalciferol (VITAMIN D3) 25 MCG (1000 UNIT) tablet Take 1,000 Units by mouth in the morning.   clopidogrel (PLAVIX) 75 MG tablet TAKE 1 TABLET BY MOUTH EVERY DAY   gabapentin (NEURONTIN) 100 MG capsule Take 2 capsules (200 mg total) by mouth 3 (three) times daily.   HYDROcodone-acetaminophen (NORCO) 10-325 MG tablet Take 1 tablet by mouth every 4 (four) hours as needed for moderate pain or severe pain.   hyoscyamine (LEVSIN, ANASPAZ) 0.125 MG tablet Take 0.125 mg by mouth every 4 (four) hours as needed for cramping.    isosorbide mononitrate (IMDUR) 30 MG 24 hr tablet Take 1 tablet (30 mg total) by mouth daily. (Patient taking differently: Take 30 mg by mouth daily as needed (chest pain).)   levofloxacin (LEVAQUIN) 750 MG tablet Take 1 tablet (750 mg total) by mouth daily.   metaxalone (SKELAXIN) 800 MG tablet Take 800 mg by mouth 3 (three) times daily as needed for muscle spasms.   metoprolol succinate (TOPROL-XL) 50 MG 24 hr tablet TAKE 1 TABLET BY MOUTH EVERY DAY AFTER A MEAL (Patient taking differently: 50 mg daily.)   nitroGLYCERIN (NITROSTAT) 0.4 MG SL tablet Place 0.4 mg under the tongue every 5 (five) minutes as needed for chest  pain.   pantoprazole (PROTONIX) 40 MG tablet TAKE 1 TABLET BY MOUTH DAILY BEFORE BREAKFAST   PARoxetine (PAXIL) 40 MG tablet Take 40 mg by mouth in the morning.   predniSONE (DELTASONE) 20 MG tablet Take 1 tablet (20 mg total) by mouth daily with breakfast.   Probiotic Product (PROBIOTIC PO) Take 1 capsule by mouth daily.   tamsulosin (FLOMAX) 0.4 MG CAPS capsule Take 0.4 mg by  mouth 2 (two) times daily.   valsartan (DIOVAN) 320 MG tablet Take 0.5 tablets (160 mg total) by mouth daily.   No facility-administered encounter medications on file as of 04/15/2021.    Physical Exam: Blood pressure 132/60, pulse (!) 103, temperature 97.6 F (36.4 C), temperature source Oral, height 6' (1.829 m), weight 205 lb 6.4 oz (93.2 kg), SpO2 94 %. Gen:      No acute distress HEENT:  EOMI, sclera anicteric Neck:     No masses; no thyromegaly Lungs:    Clear to auscultation bilaterally; normal respiratory effort CV:         Regular rate and rhythm; no murmurs Abd:      + bowel sounds; soft, non-tender; no palpable masses, no distension Ext:    No edema; adequate peripheral perfusion Skin:      Warm and dry; no rash Neuro: alert and oriented x 3 Psych: normal mood and affect   Data Reviewed: Imaging: CT chest 07/18/2013-interstitial fibrosis with traction bronchiectasis with basilar predominance, emphysema.  Probable UIP pattern CT abdomen pelvis 02/07/17- basal pulmonary fibrosis. CT high-res 12/18/2020-new spiculated lung nodule in the right upper lobe PET scan 01/05/2021-uptake in the right upper lobe, equivocal uptake in the right hilum.  Left sphenoid sinus disease. Chest x-ray 04/01/2021-dense right lower lobe infiltrate CTA 04/13/2021-no PE, large right pneumothorax I have reviewed the images personally  PFTs: 11/27/2020 FVC 4.03 [85%], FEV1 2.95 [84%], F/F 73, TLC 6.09 [81%], DLCO 20.01 [73%] Minimal diffusion defect.  Labs: CTD serologies 11/26/2020-P ANCA 1:80  Assessment:  Recurrent right pneumothorax Underwent needle evacuation earlier this week but unfortunately the pneumothorax has recurred.  We have placed a chest tube with Heimlich valve and it. Follow-up with Dr. 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  Has slight progression on repeat CT scan Elevation in ANCA is likely nonspecific as he has no signs of connective tissue disease.  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   Plan/Recommendations: Chest tube with Heimlich valve Follow-up chest x-ray Patient has been instructed to go to the ED if he has recurrent shortness of breath  Marshell Garfinkel MD [facility] 04/15/2021, 2:44 PM  CC: Seward Carol, MD

## 2021-04-17 ENCOUNTER — Telehealth: Payer: Self-pay | Admitting: Internal Medicine

## 2021-04-17 NOTE — Telephone Encounter (Signed)
Fluid building up in glove attached to heimlich x extra gloves every 4 hours  F/u 1/20 with Dr Windell Norfolk at least by phone

## 2021-04-19 ENCOUNTER — Emergency Department (HOSPITAL_COMMUNITY): Payer: PPO

## 2021-04-19 ENCOUNTER — Other Ambulatory Visit: Payer: Self-pay

## 2021-04-19 ENCOUNTER — Inpatient Hospital Stay (HOSPITAL_COMMUNITY)
Admission: EM | Admit: 2021-04-19 | Discharge: 2021-04-21 | DRG: 200 | Disposition: A | Discharge status: Home / Self Care | Payer: PPO | Attending: Internal Medicine | Admitting: Internal Medicine

## 2021-04-19 ENCOUNTER — Encounter (HOSPITAL_COMMUNITY): Payer: Self-pay

## 2021-04-19 DIAGNOSIS — I951 Orthostatic hypotension: Secondary | ICD-10-CM | POA: Diagnosis not present

## 2021-04-19 DIAGNOSIS — Z20822 Contact with and (suspected) exposure to covid-19: Secondary | ICD-10-CM | POA: Diagnosis present

## 2021-04-19 DIAGNOSIS — I252 Old myocardial infarction: Secondary | ICD-10-CM

## 2021-04-19 DIAGNOSIS — I10 Essential (primary) hypertension: Secondary | ICD-10-CM | POA: Diagnosis present

## 2021-04-19 DIAGNOSIS — I25110 Atherosclerotic heart disease of native coronary artery with unstable angina pectoris: Secondary | ICD-10-CM | POA: Diagnosis not present

## 2021-04-19 DIAGNOSIS — J8410 Pulmonary fibrosis, unspecified: Secondary | ICD-10-CM | POA: Diagnosis present

## 2021-04-19 DIAGNOSIS — Z87891 Personal history of nicotine dependence: Secondary | ICD-10-CM

## 2021-04-19 DIAGNOSIS — Z981 Arthrodesis status: Secondary | ICD-10-CM

## 2021-04-19 DIAGNOSIS — Z888 Allergy status to other drugs, medicaments and biological substances status: Secondary | ICD-10-CM | POA: Diagnosis not present

## 2021-04-19 DIAGNOSIS — I2510 Atherosclerotic heart disease of native coronary artery without angina pectoris: Secondary | ICD-10-CM | POA: Diagnosis present

## 2021-04-19 DIAGNOSIS — N400 Enlarged prostate without lower urinary tract symptoms: Secondary | ICD-10-CM

## 2021-04-19 DIAGNOSIS — Z7902 Long term (current) use of antithrombotics/antiplatelets: Secondary | ICD-10-CM

## 2021-04-19 DIAGNOSIS — Z96643 Presence of artificial hip joint, bilateral: Secondary | ICD-10-CM | POA: Diagnosis present

## 2021-04-19 DIAGNOSIS — J9312 Secondary spontaneous pneumothorax: Secondary | ICD-10-CM | POA: Diagnosis not present

## 2021-04-19 DIAGNOSIS — I739 Peripheral vascular disease, unspecified: Secondary | ICD-10-CM | POA: Diagnosis present

## 2021-04-19 DIAGNOSIS — E785 Hyperlipidemia, unspecified: Secondary | ICD-10-CM | POA: Diagnosis present

## 2021-04-19 DIAGNOSIS — C3490 Malignant neoplasm of unspecified part of unspecified bronchus or lung: Secondary | ICD-10-CM

## 2021-04-19 DIAGNOSIS — G4733 Obstructive sleep apnea (adult) (pediatric): Secondary | ICD-10-CM | POA: Diagnosis present

## 2021-04-19 DIAGNOSIS — F419 Anxiety disorder, unspecified: Secondary | ICD-10-CM | POA: Diagnosis present

## 2021-04-19 DIAGNOSIS — Z902 Acquired absence of lung [part of]: Secondary | ICD-10-CM | POA: Diagnosis not present

## 2021-04-19 DIAGNOSIS — K219 Gastro-esophageal reflux disease without esophagitis: Secondary | ICD-10-CM | POA: Diagnosis present

## 2021-04-19 DIAGNOSIS — Z79899 Other long term (current) drug therapy: Secondary | ICD-10-CM

## 2021-04-19 DIAGNOSIS — Z955 Presence of coronary angioplasty implant and graft: Secondary | ICD-10-CM

## 2021-04-19 DIAGNOSIS — J939 Pneumothorax, unspecified: Secondary | ICD-10-CM | POA: Diagnosis present

## 2021-04-19 DIAGNOSIS — Z951 Presence of aortocoronary bypass graft: Secondary | ICD-10-CM

## 2021-04-19 DIAGNOSIS — I959 Hypotension, unspecified: Secondary | ICD-10-CM

## 2021-04-19 DIAGNOSIS — C3411 Malignant neoplasm of upper lobe, right bronchus or lung: Secondary | ICD-10-CM | POA: Diagnosis present

## 2021-04-19 DIAGNOSIS — Z8249 Family history of ischemic heart disease and other diseases of the circulatory system: Secondary | ICD-10-CM

## 2021-04-19 DIAGNOSIS — Z9689 Presence of other specified functional implants: Secondary | ICD-10-CM

## 2021-04-19 LAB — CBC WITH DIFFERENTIAL/PLATELET
Abs Immature Granulocytes: 0.1 10*3/uL — ABNORMAL HIGH (ref 0.00–0.07)
Basophils Absolute: 0.1 10*3/uL (ref 0.0–0.1)
Basophils Relative: 1 %
Eosinophils Absolute: 0.6 10*3/uL — ABNORMAL HIGH (ref 0.0–0.5)
Eosinophils Relative: 4 %
HCT: 46.2 % (ref 39.0–52.0)
Hemoglobin: 15.6 g/dL (ref 13.0–17.0)
Immature Granulocytes: 1 %
Lymphocytes Relative: 8 %
Lymphs Abs: 1 10*3/uL (ref 0.7–4.0)
MCH: 32 pg (ref 26.0–34.0)
MCHC: 33.8 g/dL (ref 30.0–36.0)
MCV: 94.9 fL (ref 80.0–100.0)
Monocytes Absolute: 1.2 10*3/uL — ABNORMAL HIGH (ref 0.1–1.0)
Monocytes Relative: 9 %
Neutro Abs: 10.6 10*3/uL — ABNORMAL HIGH (ref 1.7–7.7)
Neutrophils Relative %: 77 %
Platelets: 296 10*3/uL (ref 150–400)
RBC: 4.87 MIL/uL (ref 4.22–5.81)
RDW: 12.9 % (ref 11.5–15.5)
WBC: 13.6 10*3/uL — ABNORMAL HIGH (ref 4.0–10.5)
nRBC: 0 % (ref 0.0–0.2)

## 2021-04-19 LAB — BASIC METABOLIC PANEL
Anion gap: 6 (ref 5–15)
BUN: 15 mg/dL (ref 8–23)
CO2: 28 mmol/L (ref 22–32)
Calcium: 8.7 mg/dL — ABNORMAL LOW (ref 8.9–10.3)
Chloride: 100 mmol/L (ref 98–111)
Creatinine, Ser: 0.85 mg/dL (ref 0.61–1.24)
GFR, Estimated: >60 mL/min (ref >60)
Glucose, Bld: 110 mg/dL — ABNORMAL HIGH (ref 70–99)
Potassium: 4.3 mmol/L (ref 3.5–5.1)
Sodium: 134 mmol/L — ABNORMAL LOW (ref 135–145)

## 2021-04-19 LAB — RESP PANEL BY RT-PCR (FLU A&B, COVID) ARPGX2
Influenza A by PCR: NEGATIVE
Influenza B by PCR: NEGATIVE
SARS Coronavirus 2 by RT PCR: NEGATIVE

## 2021-04-19 MED ORDER — SODIUM CHLORIDE 0.9% FLUSH
10.0000 mL | Freq: Three times a day (TID) | INTRAVENOUS | Status: DC
  Administered 2021-04-19 – 2021-04-20 (×2): 10 mL

## 2021-04-19 NOTE — ED Triage Notes (Signed)
Patient has a right chest tube for a pneumothorax and has air present.

## 2021-04-19 NOTE — Telephone Encounter (Signed)
Letter sent.

## 2021-04-19 NOTE — Consult Note (Addendum)
NAME:  Richard Parrish, MRN:  [id number], DOB:  Sep 01, 1950, LOS: 0 ADMISSION DATE:  04/19/2021, CONSULTATION DATE:  2/20 REFERRING MD:  Dr. Melina Copa, CHIEF COMPLAINT:  Drainage from chest tube    History of Present Illness:  71 y/o M who presented to the [facility] ER on 2/20 with reports of malfunctioning chest tube.   The patient is followed by Dr. Vaughan Browner for ILD, with non-specific elevation elevation of ANCA, no evidence of connective tissue disease. 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  He had a 2cm RUL pulmonary nodule and on 1/4 underwent a fiberoptic bronchoscopy with robotic assistance for lung biospy, VATS thoracoscopy with RUL lobectomy, RLL wedge resection, lymph node biopsy and intercostal nerve block.  His post operative course was uneventful while inpatient and he was discharged home on 1/8.  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 RLL wedge resection was consistent with acute inflammatory  cells thought to be bronchopneumonia.  Recommendations post hospitalization from Dr. Julien Nordmann / ONC were for observation.    He was admitted 2/14-2/15 with increasing shortness of breath in the setting of right sided pneumothorax.  He underwent aspiration of pneumothorax with resolution of pneumothorax. He was discharged 2/15 with follow up in the pulmonary clinic on 2/16 with 2V CXR which showed recurrent pneumothorax.  In office, he had a chest tube placed with Heimlich valve.    The patient retuns 2/20 with reports of increased drainage from the chest tube at home.  He has several oz's of drainage from the chest tube. The patient covered the Heimlich valve tightly with a balloon. He noted when he would take a deep breath and bear down, the balloon would fill with air.  He subsequently developed chest pain prompting ER evaluation.   PCCM consulted for consultation.    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 rate (!) 22, height 6' (1.829 m), weight 93.2 kg, SpO2 100 %.       No intake or output data in the 24 hours ending 04/19/21 1437 Filed Weights   04/19/21 1121  Weight: 93.2 kg    Examination: General: elderly adult male lying on ER stretcher in NAD HENT: MM pink/moist, no jvd, anicteric, good dentition Lungs: non-labored at rest, lungs bilaterally with dry crackles, good air entry, right apical chest tube in place c/d/i Cardiovascular: S1S2 RRR, no m/r/g Abdomen: soft, non-tender, bsx4 active  Extremities: warm/dry, no edema  Neuro: AAOx4, speech clear, MAE  Resolved Hospital Problem list     Assessment & Plan:   Pneumothorax s/p Chest Tube Placement  UIP / IPF  Recent VATS Thoracoscopy with RUL Lobectomy, RLL Wedge Resection (1/24) Chest tube placed on 2/16 in pulmonary clinic with Heimlich valve.  Initially had good response. Unfortunately, it sounds as if the Heimlich valve was taped shut and was unable to allow air to exit creating secondary PTX.  -place chest tube to -20cm suction  -follow up CXR in  am  -PRN pain control  -hopeful PTX will have resolved in am  -will work toward getting a mini-vac for discharge -likely will need to follow up with Dr. Kipp Brood  -no indication of acute infectious process -chest tube care per protocol   Best Practice (right click and "Reselect all SmartList Selections" daily)  Per Primary -  Code Status:  full code Last date of multidisciplinary goals of care discussion n/a  Labs   CBC: Recent Labs  Lab  04/13/21 1629 04/13/21 1639 04/19/21 1146  WBC 18.1*  --  13.6*  NEUTROABS 16.2*  --  10.6*  HGB 16.2 16.3 15.6  HCT 47.5 48.0 46.2  MCV 94.6  --  94.9  PLT 436*  --  295    Basic Metabolic Panel: Recent Labs  Lab 04/13/21 1629 04/13/21 1639 04/19/21 1146  NA 134* 135 134*  K 4.2 4.3 4.3  CL 97* 98 100  CO2 30  --  28  GLUCOSE 123* 116* 110*  BUN 23 21 15   CREATININE 0.79 0.80 0.85  CALCIUM 9.5  --  8.7*   GFR: Estimated Creatinine Clearance: 93.1 mL/min (by C-G formula based on SCr of 0.85 mg/dL). Recent Labs  Lab 04/13/21 1629 04/19/21 1146  WBC 18.1* 13.6*    Liver Function Tests: Recent Labs  Lab 04/13/21 1629  AST 28  ALT 33  ALKPHOS 84  BILITOT 0.8  PROT 7.6  ALBUMIN 4.4   No results for input(s): LIPASE, AMYLASE in the last 168 hours. No results for input(s): AMMONIA in the last 168 hours.  ABG    Component Value Date/Time   PHART 7.402 03/02/2021 1124   PCO2ART 38.9 03/02/2021 1124   PO2ART 102 03/02/2021 1124   HCO3 23.7 03/02/2021 1124   TCO2 29 04/13/2021 1639   ACIDBASEDEF 0.5 03/02/2021 1124   O2SAT 97.6 03/02/2021 1124     Coagulation Profile: No results for input(s): INR, PROTIME in the last 168 hours.  Cardiac Enzymes: No results for input(s): CKTOTAL, CKMB, CKMBINDEX, TROPONINI in the last 168 hours.  HbA1C: Hgb A1c MFr Bld  Date/Time Value Ref Range Status  10/03/2020 07:49 PM 5.3 4.8 - 5.6 % Final    Comment:    (NOTE) Pre diabetes:          5.7%-6.4%  Diabetes:              >6.4%  Glycemic control for   <7.0% adults with diabetes   01/08/2018 08:21 AM 5.1 4.8 - 5.6 % Final    Comment:             Prediabetes: 5.7 - 6.4          Diabetes: >6.4          Glycemic control for adults with diabetes: <7.0     CBG: No results for input(s): GLUCAP in the last 168 hours.  Review of Systems: Positives in [place]   Gen: Denies fever, chills, weight change, fatigue, night sweats HEENT: Denies blurred vision, double  vision, hearing loss, tinnitus, sinus congestion, rhinorrhea, sore throat, neck stiffness, dysphagia PULM: Denies shortness of breath, cough, sputum production, hemoptysis, wheezing CV: Denies chest pain, edema, orthopnea, paroxysmal nocturnal dyspnea, palpitations GI: Denies abdominal pain, nausea, vomiting, diarrhea, hematochezia, melena, constipation, change in bowel habits GU: Denies dysuria, hematuria, polyuria, oliguria, urethral discharge Endocrine: Denies hot or cold intolerance, polyuria, polyphagia or appetite change Derm: Denies rash, dry skin, scaling or peeling skin change Heme: Denies easy bruising, bleeding, bleeding gums  Neuro: Denies headache, numbness, weakness, slurred speech, loss of memory or consciousness  Past Medical History:  He,  has a past medical history of Anxiety, Arthritis, BPH (benign prostatic hypertrophy), C. difficile diarrhea, Chronic lower back pain, Coronary artery disease (1989), Depression, GERD (gastroesophageal reflux disease), Hyperlipidemia, Hypertension, Insomnia, Myocardial infarction ([place]) (2002), OSA on CPAP, Osteoarthritis, PAD (peripheral artery disease) ([place]), Peripheral vascular disease ([place]), Pneumonia (1989), Pulmonary fibrosis ([place]), and Spinal stenosis.   Surgical History:   Past Surgical History:  Procedure Laterality Date   ABDOMINAL AORTAGRAM N/A 03/15/2011   Procedure: ABDOMINAL AORTAGRAM;  Surgeon: Serafina Mitchell, MD;  Location: [facility] CATH LAB;  Service: Cardiovascular;  Laterality: N/A;   ABDOMINAL AORTAGRAM N/A 04/12/2011   Procedure: ABDOMINAL Maxcine Ham;  Surgeon: Serafina Mitchell, MD;  Location: [facility] CATH LAB;  Service: Cardiovascular;  Laterality: N/A;   BACK SURGERY     BRONCHIAL BIOPSY  03/03/2021   Procedure: BRONCHIAL BIOPSIES;  Surgeon: Garner Nash, DO;  Location: [place] ENDOSCOPY;  Service: Pulmonary;;   BRONCHIAL BRUSHINGS  03/03/2021   Procedure: BRONCHIAL BRUSHINGS;  Surgeon: Garner Nash, DO;  Location: [place];   Service: Pulmonary;;   BRONCHIAL NEEDLE ASPIRATION BIOPSY  03/03/2021   Procedure: BRONCHIAL NEEDLE ASPIRATION BIOPSIES;  Surgeon: Garner Nash, DO;  Location: [place] [facility]OSCOPY;  Service: Pulmonary;;   CARDIAC CATHETERIZATION  08/1987   CORONARY ANGIOPLASTY WITH STENT PLACEMENT  ~ 2002   [facility], [place]   CORONARY ARTERY BYPASS GRAFT  09/1987   "CABG X1";  [facility]; [place]; "RCA"   FIDUCIAL MARKER PLACEMENT  03/03/2021   Procedure: FIDUCIAL DYE MARKING;  Surgeon: Garner Nash, DO;  Location: [place] [facility];  Service: Pulmonary;;   INTERCOSTAL NERVE BLOCK  03/03/2021   Procedure: INTERCOSTAL NERVE BLOCK;  Surgeon: Lajuana Matte, MD;  Location: [place];  Service: Thoracic;;   JOINT REPLACEMENT     KNEE ARTHROSCOPY Left    LAPAROSCOPIC CHOLECYSTECTOMY     LYMPH NODE BIOPSY  03/03/2021   Procedure: LYMPH NODE BIOPSY;  Surgeon: Lajuana Matte, MD;  Location: [place];  Service: Thoracic;;   LYMPH NODE DISSECTION  03/03/2021   Procedure: LYMPH NODE DISSECTION;  Surgeon: Lajuana Matte, MD;  Location: [place];  Service: Thoracic;;   PERIPHERAL VASCULAR INTERVENTION Left 2016   "put a stent; right branch of left femoral"   POSTERIOR LUMBAR FUSION  2009   lumbar fusion, S L 5 to L 4   RIGHT/LEFT HEART CATH AND CORONARY/GRAFT ANGIOGRAPHY N/A 10/05/2020   Procedure: RIGHT/LEFT HEART CATH AND CORONARY/GRAFT ANGIOGRAPHY;  Surgeon: Nelva Bush, MD;  Location: [place] CV LAB;  Service: Cardiovascular;  Laterality: N/A;   SHOULDER ARTHROSCOPY W/ ROTATOR CUFF REPAIR Right 2006   SHOULDER ARTHROSCOPY WITH SUBACROMIAL DECOMPRESSION AND OPEN ROTATOR C Left ~ [place]     TOTAL HIP ARTHROPLASTY Right 2012   TOTAL HIP ARTHROPLASTY Left 04/07/2017   TOTAL HIP ARTHROPLASTY Left 04/07/2017   Procedure: LEFT TOTAL HIP ARTHROPLASTY;  Surgeon: Earlie Server, MD;  Location: [place];  Service: Orthopedics;  Laterality: Left;   VIDEO BRONCHOSCOPY WITH  RADIAL ENDOBRONCHIAL ULTRASOUND  03/03/2021   Procedure: VIDEO BRONCHOSCOPY WITH RADIAL ENDOBRONCHIAL ULTRASOUND;  Surgeon: Garner Nash, DO;  Location: [place];  Service: Pulmonary;;     Social History:   reports that he quit smoking about 34 years ago. His smoking use included cigarettes. He has a 30.00 pack-year smoking history. He has never used smokeless  tobacco. He reports current alcohol use. He reports that he does not use drugs.   Family History:  His family history includes Cancer in his father; Heart disease in his mother.   Allergies Allergies  Allergen Reactions   Ambien [Zolpidem Tartrate] Other (See Comments)    Bad dreams/Vivid Dreams     Home Medications  Prior to Admission medications   Medication Sig Start Date End Date Taking? Authorizing Provider  acetaminophen (TYLENOL) 500 MG tablet Take 500 mg by mouth every 6 (six) hours as needed for moderate pain.   Yes [provider]  ALPRAZolam (XANAX) 0.5 MG tablet Take 0.25-0.5 mg by mouth 3 (three) times daily as needed for anxiety or sleep. 01/31/21  Yes [provider]  antiseptic oral rinse (BIOTENE) LIQD 15 mLs by Mouth Rinse route as needed for dry mouth.   Yes [provider]  atorvastatin (LIPITOR) 80 MG tablet TAKE 1 TABLET BY MOUTH EVERY DAY Patient taking differently: Take 80 mg by mouth daily. 02/11/21  Yes Belva Crome, MD  budesonide (ENTOCORT EC) 3 MG 24 hr capsule Take 3-6 mg by mouth See admin instructions. Takes 2 capsules (6 mg) by mouth in the morning and take 1 capsule (3 mg) by mouth at night   Yes [provider]  cholecalciferol (VITAMIN D3) 25 MCG (1000 UNIT) tablet Take 1,000 Units by mouth in the morning.   Yes [provider]  clopidogrel (PLAVIX) 75 MG tablet TAKE 1 TABLET BY MOUTH EVERY DAY Patient taking differently: Take 75 mg by mouth daily. 08/27/20  Yes Waynetta Sandy, MD  gabapentin (NEURONTIN) 100 MG capsule Take 2 capsules  (200 mg total) by mouth 3 (three) times daily. 03/07/21  Yes Lars Pinks M, PA-C  HYDROcodone-acetaminophen (NORCO) 10-325 MG tablet Take 1 tablet by mouth every 4 (four) hours as needed for moderate pain or severe pain. 04/10/21  Yes [provider]  hyoscyamine (LEVSIN, ANASPAZ) 0.125 MG tablet Take 0.125 mg by mouth every 4 (four) hours as needed for cramping.    Yes [provider]  isosorbide mononitrate (IMDUR) 30 MG 24 hr tablet Take 1 tablet (30 mg total) by mouth daily. Patient taking differently: Take 30 mg by mouth daily as needed (chest pain). 10/05/20 10/05/21 Yes Bhagat, Bhavinkumar, PA  Lidocaine (ASPERCREME LIDOCAINE) 4 % PTCH Apply 1 patch topically as needed (pain).   Yes [provider]  metaxalone (SKELAXIN) 800 MG tablet Take 800 mg by mouth 3 (three) times daily as needed for muscle spasms.   Yes [provider]  metoprolol succinate (TOPROL-XL) 50 MG 24 hr tablet TAKE 1 TABLET BY MOUTH EVERY DAY AFTER A MEAL Patient taking differently: 50 mg daily. 02/15/21  Yes Belva Crome, MD  nitroGLYCERIN (NITROSTAT) 0.4 MG SL tablet Place 0.4 mg under the tongue every 5 (five) minutes as needed for chest pain.   Yes [provider]  pantoprazole (PROTONIX) 40 MG tablet TAKE 1 TABLET BY MOUTH DAILY BEFORE BREAKFAST Patient taking differently: Take 40 mg by mouth daily before breakfast. 01/01/21  Yes Belva Crome, MD  PARoxetine (PAXIL) 40 MG tablet Take 40 mg by mouth in the morning. 01/05/15  Yes [provider]  Probiotic Product (PROBIOTIC PO) Take 1 capsule by mouth daily.   Yes [provider]  sodium chloride (OCEAN) 0.65 % SOLN nasal spray Place 1 spray into both nostrils as needed for congestion.   Yes [provider]  tamsulosin (FLOMAX) 0.4 MG CAPS  capsule Take 0.4 mg by mouth 2 (two) times daily. 08/18/14  Yes [provider]  valsartan (DIOVAN) 320 MG tablet Take 0.5 tablets (160 mg total) by  mouth daily. 03/07/21  Yes Lars Pinks M, PA-C  levofloxacin (LEVAQUIN) 750 MG tablet Take 1 tablet (750 mg total) by mouth daily. Patient not taking: Reported on 04/19/2021 04/09/21   Marshell Garfinkel, MD  predniSONE (DELTASONE) 20 MG tablet Take 1 tablet (20 mg total) by mouth daily with breakfast. Patient not taking: Reported on 04/19/2021 04/09/21   Marshell Garfinkel, MD     Critical care time: n/a     Noe Gens, MSN, APRN, NP-C, AGACNP-BC [facility] 04/19/2021, 2:37 PM   Please see [email or web address] for pager details.   From 7A-7P if no response, please call [phone] After hours, please call ELink [phone]

## 2021-04-19 NOTE — ED Notes (Signed)
Pt assisted with urinal, repositioned in bed. Chest tube sealed, continuous suction maintained, all patent

## 2021-04-19 NOTE — Telephone Encounter (Signed)
Called and spoke with patient. Patient stated that over the weekend he had removed about 1 pint of fluid per hour from the chest tube. Since 7am this morning, the tube is now dry with no fluid coming out. Also stated that when he coughs, the balloon fills up with air and is starting to hurt. Pain described as a sharp pain.   Denies seeing any signs of infection around the site. Denies any fevers.   He wanted to know if this is a sign that the tube should be removed.   BI, can you please advise? Thanks!

## 2021-04-19 NOTE — Telephone Encounter (Signed)
Patient is returning phone call. Patient is on the way to [facility] Emergency Department. Patient phone number is [phone].

## 2021-04-19 NOTE — ED Provider Notes (Signed)
[facility] DEPT Provider Note   CSN: [id number] Arrival date & time: 04/19/21  1059     History  No chief complaint on file.   Richard Parrish is a 71 y.o. male.  He has a history of lung cancer and had a bronchoscopy and resection done early January.  He was seen 4 days ago in pulmonary clinic and found to have a recurrent pneumothorax.  He had a pigtail placement with a Heimlich valve by Dr. Valeta Harms.  He has had some persistent fluid output from the catheter.  He has had some on and off chest pain upper back pain on the right side.  Today he noticed that there was air in the collecting reservoir.  Feels somewhat more short of breath with any exertion.  The history is provided by the patient and the spouse.  Shortness of Breath Severity:  Moderate Onset quality:  Sudden Duration:  2 hours Timing:  Intermittent Progression:  Unchanged Chronicity:  Recurrent Context: activity   Relieved by:  Rest Worsened by:  Activity Ineffective treatments:  None tried Associated symptoms: chest pain   Associated symptoms: no abdominal pain, no cough, no fever, no hemoptysis and no vomiting   Risk factors: hx of cancer       Home Medications Prior to Admission medications   Medication Sig Start Date End Date Taking? Authorizing Provider  ALPRAZolam Duanne Moron) 0.5 MG tablet Take 0.25-0.5 mg by mouth 3 (three) times daily as needed for anxiety or sleep. 01/31/21   [provider]  atorvastatin (LIPITOR) 80 MG tablet TAKE 1 TABLET BY MOUTH EVERY DAY 02/11/21   Belva Crome, MD  budesonide (ENTOCORT EC) 3 MG 24 hr capsule Take 3-6 mg by mouth See admin instructions. Takes 2 capsules (6 mg) by mouth in the morning and take 1 capsule (3 mg) by mouth at night    [provider]  cholecalciferol (VITAMIN D3) 25 MCG (1000 UNIT) tablet Take 1,000 Units by mouth in the morning.    [provider]  clopidogrel (PLAVIX) 75 MG tablet TAKE 1 TABLET BY  MOUTH EVERY DAY 08/27/20   Waynetta Sandy, MD  gabapentin (NEURONTIN) 100 MG capsule Take 2 capsules (200 mg total) by mouth 3 (three) times daily. 03/07/21   Nani Skillern, PA-C  HYDROcodone-acetaminophen (NORCO) 10-325 MG tablet Take 1 tablet by mouth every 4 (four) hours as needed for moderate pain or severe pain. 04/10/21   [provider]  hyoscyamine (LEVSIN, ANASPAZ) 0.125 MG tablet Take 0.125 mg by mouth every 4 (four) hours as needed for cramping.     [provider]  isosorbide mononitrate (IMDUR) 30 MG 24 hr tablet Take 1 tablet (30 mg total) by mouth daily. Patient taking differently: Take 30 mg by mouth daily as needed (chest pain). 10/05/20 10/05/21  [name], PA  levofloxacin (LEVAQUIN) 750 MG tablet Take 1 tablet (750 mg total) by mouth daily. 04/09/21   Mannam, Hart Robinsons, MD  metaxalone (SKELAXIN) 800 MG tablet Take 800 mg by mouth 3 (three) times daily as needed for muscle spasms.    [provider]  metoprolol succinate (TOPROL-XL) 50 MG 24 hr tablet TAKE 1 TABLET BY MOUTH EVERY DAY AFTER A MEAL Patient taking differently: 50 mg daily. 02/15/21   Belva Crome, MD  nitroGLYCERIN (NITROSTAT) 0.4 MG SL tablet Place 0.4 mg under the tongue every 5 (five) minutes as needed for chest pain.    [provider]  pantoprazole ([place])  40 MG tablet TAKE 1 TABLET BY MOUTH DAILY BEFORE BREAKFAST 01/01/21   Belva Crome, MD  PARoxetine (PAXIL) 40 MG tablet Take 40 mg by mouth in the morning. 01/05/15   [provider]  predniSONE (DELTASONE) 20 MG tablet Take 1 tablet (20 mg total) by mouth daily with breakfast. 04/09/21   Mannam, Hart Robinsons, MD  Probiotic Product (PROBIOTIC PO) Take 1 capsule by mouth daily.    [provider]  tamsulosin (FLOMAX) 0.4 MG CAPS capsule Take 0.4 mg by mouth 2 (two) times daily. 08/18/14   [provider]  valsartan (DIOVAN) 320 MG tablet Take 0.5 tablets (160 mg total) by mouth daily.  03/07/21   Nani Skillern, PA-C      Allergies    Ambien [zolpidem tartrate]    Review of Systems   Review of Systems  Constitutional:  Negative for fever.  Respiratory:  Positive for shortness of breath. Negative for cough and hemoptysis.   Cardiovascular:  Positive for chest pain.  Gastrointestinal:  Negative for abdominal pain and vomiting.   Physical Exam Updated Vital Signs BP 123/69 (BP Location: Right Arm)    Pulse 72    Temp 97.7 F (36.5 C) (Oral)    Resp 18    SpO2 97%  Physical Exam Vitals and nursing note reviewed.  Constitutional:      General: He is not in acute distress.    Appearance: Normal appearance. He is well-developed.  HENT:     Head: Normocephalic and atraumatic.  Eyes:     Conjunctiva/sclera: Conjunctivae normal.  Cardiovascular:     Rate and Rhythm: Normal rate and regular rhythm.     Heart sounds: No murmur heard. Pulmonary:     Effort: Pulmonary effort is normal. No respiratory distress.     Breath sounds: Normal breath sounds.     Comments: He has a pigtail chest tube right upper chest with some surrounding bruising.  No chest wall crepitus. Abdominal:     Palpations: Abdomen is soft.     Tenderness: There is no abdominal tenderness.  Musculoskeletal:        General: No swelling.     Cervical back: Neck supple.  Skin:    General: Skin is warm and dry.     Capillary Refill: Capillary refill takes less than 2 seconds.  Neurological:     General: No focal deficit present.     Mental Status: He is alert.    ED Results / Procedures / Treatments   Labs (all labs ordered are listed, but only abnormal results are displayed) Labs Reviewed  BASIC METABOLIC PANEL - Abnormal; Notable for the following components:      Result Value   Sodium 134 (*)    Glucose, Bld 110 (*)    Calcium 8.7 (*)    All other components within normal limits  CBC WITH DIFFERENTIAL/PLATELET - Abnormal; Notable for the following components:   WBC 13.6 (*)     Neutro Abs 10.6 (*)    Monocytes Absolute 1.2 (*)    Eosinophils Absolute 0.6 (*)    Abs Immature Granulocytes 0.10 (*)    All other components within normal limits  RESP PANEL BY RT-PCR (FLU A&B, COVID) [id number]    EKG EKG Interpretation  Date/Time:  Monday April 19 2021 11:11:06 EST Ventricular Rate:  74 PR Interval:  185 QRS Duration: 86 QT Interval:  376 QTC Calculation: 418 R Axis:   -9 Text Interpretation: Sinus rhythm No significant change since  prior 1/23 Confirmed by Aletta Edouard [phone]) on 04/19/2021 11:19:12 AM patient's name is Halsey Hammen no better than you  Radiology DG Chest [facility] 1 View  Result Date: 04/19/2021 CLINICAL DATA:  Shortness of breath. Right chest tube present for pneumothorax. EXAM: PORTABLE CHEST 1 VIEW COMPARISON:  April 15, 2021 FINDINGS: The heart size and mediastinal contours are stable. Right chest tube is identified. There is interval increase of moderate right pneumothorax extending to the level of right fifth rib. Patchy consolidation of the right lung base is noted. Mild patchy consolidation of left lung base is improved compared prior exam. The visualized skeletal structures are stable. IMPRESSION: 1. Right chest tube is identified. There is interval increase of moderate right pneumothorax extending to the level of right fifth rib. 2. Patchy consolidation of the right lung base. Mild patchy consolidation of left lung base is improved compared prior exam. These results will be called to the ordering clinician or representative by the Radiologist Assistant, and communication documented in the PACS or [employer]. Electronically Signed   By: Abelardo Diesel M.D.   On: 04/19/2021 11:38    Procedures Procedures    Medications Ordered in ED Medications - No data to display  ED Course/ Medical Decision Making/ A&P Clinical Course as of 04/19/21 1850  Mon Apr 19, 2021  1133 Chest x-ray interpreted by me as recurrent pneumothorax on right.   Awaiting radiology reading. [MB]  1354 Discussed with Dr. Valeta Harms from pulmonary.  Will need to go back on a Pleur-evac to wall suction.  Medicine admission and they will follow as consultant. [MB]  1403 Discussed with Dr. Marylyn Ishihara [facility] hospitalist who will evaluate the patient for admission. [MB]  5784 Patient was put back on Pleur-evac to low continuous suction.  Tolerated procedure well. [MB]    Clinical Course User Index [MB] Hayden Rasmussen, MD                           Medical Decision Making Amount and/or Complexity of Data Reviewed Labs: ordered. Radiology: ordered.  Risk Decision regarding hospitalization.  This patient complains of increased shortness of breath; this involves an extensive number of treatment Options and is a complaint that carries with it a high risk of complications and morbidity. The differential includes recurrent pneumothorax, PE, pleural effusion, pneumonia I ordered, reviewed and interpreted labs, which included CBC with elevated white count normal hemoglobin, chemistries fairly unremarkable, COVID and flu negative I ordered imaging studies which included chest x-ray and I independently    visualized and interpreted imaging which showed recurrent 25% pneumothorax on the right Additional history obtained from patient's wife Previous records obtained and reviewed in epic including prior pulmonology notes I consulted Dr. Valeta Harms pulmonology and Dr. Marylyn Ishihara [facility] hospitalist and discussed lab and imaging findings and discussed disposition.  Cardiac monitoring reviewed, patient in normal sinus rhythm Social determinants considered, noncontributory Critical Interventions: None  After the interventions stated above, I reevaluated the patient and found patient to be asymptomatic at rest.  Continues to oxygenate well on room air, placed on 2 L for pneumothorax.  Chest tube back to low wall suction. Admission and further testing considered, patient will need admission to  the hospital for further management.  He is in agreement with plan for admission.          Final Clinical Impression(s) / ED Diagnoses Final diagnoses:  Recurrent pneumothorax  Malignant neoplasm of lung, unspecified laterality, unspecified part of  lung [facility])    Rx / DC Orders ED Discharge Orders     None         Hayden Rasmussen, MD 04/19/21 [phone]

## 2021-04-19 NOTE — Telephone Encounter (Signed)
Lm for patient.  

## 2021-04-19 NOTE — Telephone Encounter (Signed)
Called patient but he did not answer. Left message for him to call us back.  

## 2021-04-19 NOTE — ED Notes (Addendum)
Pt NAD in bed, chest tube and collection chamber already applied. Pt a/ox4, states he had a chest tube placed last week but began to have SOB and "something wrong" with it. Pt denies current CP, SOB now that it has been reattached to continuous suction at 20cm. LS dim right base and middle lobe. Pleur-evac has approximately 80ml green/yellow fluid.

## 2021-04-19 NOTE — H&P (Signed)
History and Physical    Patient: Richard Parrish DPO:[id number] DOB: 12-24-1950 DOA: 04/19/2021 DOS: the patient was seen and examined on 04/19/2021 PCP: Seward Carol, MD  Patient coming from: Home  Chief Complaint: No chief complaint on file.   HPI: Richard Parrish is a 71 y.o. male with medical history significant of RUL adenocarcinoma of the lung, ILD, HTN, HLD, CAD s/p CABG, PAD, anxiety. Presenting with dyspnea and chest pain. Recently had outpt pigtail placement w/ Heimlich valve. He reports that he had good drainage w/ the pigtail. This morning, he replaced a covering with a tight balloon to help with the fluid collection. After doing so, he noted increased dyspnea and intermittent sharp chest pain. He called his pulmonary team, but became anxious and decided to come to the ED for evaluation. He denies any other aggravating or alleviating factors.      Review of Systems: As mentioned in the history of present illness. All other systems reviewed and are negative. Past Medical History:  Diagnosis Date   Anxiety    Arthritis    "lower back" (04/07/2017)   BPH (benign prostatic hypertrophy)    C. difficile diarrhea    Chronic lower back pain    Coronary artery disease 1989   cabg with dvg to rca    Depression    GERD (gastroesophageal reflux disease)    Hyperlipidemia    Hypertension    Insomnia    Myocardial infarction ([place]) 2002   OSA on CPAP    cpap setting of 60 per pt   Osteoarthritis    "hips" (04/07/2017)   PAD (peripheral artery disease) ([place])    Peripheral vascular disease ([place])    Pneumonia 1989   S/P CABG   Pulmonary fibrosis ([place])    "one time" (04/07/2017)   Spinal stenosis    Past Surgical History:  Procedure Laterality Date   ABDOMINAL AORTAGRAM N/A 03/15/2011   Procedure: ABDOMINAL Maxcine Ham;  Surgeon: Serafina Mitchell, MD;  Location: [facility] CATH LAB;  Service: Cardiovascular;  Laterality: N/A;   ABDOMINAL AORTAGRAM N/A 04/12/2011   Procedure: ABDOMINAL  Maxcine Ham;  Surgeon: Serafina Mitchell, MD;  Location: [facility] CATH LAB;  Service: Cardiovascular;  Laterality: N/A;   BACK SURGERY     BRONCHIAL BIOPSY  03/03/2021   Procedure: BRONCHIAL BIOPSIES;  Surgeon: Garner Nash, DO;  Location: [facility] ENDOSCOPY;  Service: Pulmonary;;   BRONCHIAL BRUSHINGS  03/03/2021   Procedure: BRONCHIAL BRUSHINGS;  Surgeon: Garner Nash, DO;  Location: [facility] ENDOSCOPY;  Service: Pulmonary;;   BRONCHIAL NEEDLE ASPIRATION BIOPSY  03/03/2021   Procedure: BRONCHIAL NEEDLE ASPIRATION BIOPSIES;  Surgeon: Garner Nash, DO;  Location: [facility] ENDOSCOPY;  Service: Pulmonary;;   CARDIAC CATHETERIZATION  08/1987   CORONARY ANGIOPLASTY WITH STENT PLACEMENT  ~ 2002   [facility], [place]   CORONARY ARTERY BYPASS GRAFT  09/1987   "CABG X1";  [facility]; [place]; "RCA"   FIDUCIAL MARKER PLACEMENT  03/03/2021   Procedure: FIDUCIAL DYE MARKING;  Surgeon: Garner Nash, DO;  Location: [facility];  Service: Pulmonary;;   INTERCOSTAL NERVE BLOCK  03/03/2021   Procedure: INTERCOSTAL NERVE BLOCK;  Surgeon: Lajuana Matte, MD;  Location: [facility];  Service: Thoracic;;   JOINT REPLACEMENT     KNEE ARTHROSCOPY Left    LAPAROSCOPIC CHOLECYSTECTOMY     LYMPH NODE BIOPSY  03/03/2021   Procedure: LYMPH NODE BIOPSY;  Surgeon: Lajuana Matte, MD;  Location: [facility];  Service: Thoracic;;   LYMPH  NODE DISSECTION  03/03/2021   Procedure: LYMPH NODE DISSECTION;  Surgeon: Lajuana Matte, MD;  Location: [facility];  Service: Thoracic;;   PERIPHERAL VASCULAR INTERVENTION Left 2016   "put a stent; right branch of left femoral"   POSTERIOR LUMBAR FUSION  2009   lumbar fusion, S L 5 to L 4   RIGHT/LEFT HEART CATH AND CORONARY/GRAFT ANGIOGRAPHY N/A 10/05/2020   Procedure: RIGHT/LEFT HEART CATH AND CORONARY/GRAFT ANGIOGRAPHY;  Surgeon: Nelva Bush, MD;  Location: [facility] CV LAB;  Service: Cardiovascular;  Laterality: N/A;   SHOULDER ARTHROSCOPY W/ ROTATOR CUFF REPAIR Right 2006    SHOULDER ARTHROSCOPY WITH SUBACROMIAL DECOMPRESSION AND OPEN ROTATOR C Left ~ [place]     TOTAL HIP ARTHROPLASTY Right 2012   TOTAL HIP ARTHROPLASTY Left 04/07/2017   TOTAL HIP ARTHROPLASTY Left 04/07/2017   Procedure: LEFT TOTAL HIP ARTHROPLASTY;  Surgeon: Earlie Server, MD;  Location: [facility];  Service: Orthopedics;  Laterality: Left;   VIDEO BRONCHOSCOPY WITH RADIAL ENDOBRONCHIAL ULTRASOUND  03/03/2021   Procedure: VIDEO BRONCHOSCOPY WITH RADIAL ENDOBRONCHIAL ULTRASOUND;  Surgeon: Garner Nash, DO;  Location: [facility] ENDOSCOPY;  Service: Pulmonary;;   Social History:  reports that he quit smoking about 34 years ago. His smoking use included cigarettes. He has a 30.00 pack-year smoking history. He has never used smokeless tobacco. He reports current alcohol use. He reports that he does not use drugs.  Allergies  Allergen Reactions   Ambien [Zolpidem Tartrate] Other (See Comments)    Bad dreams/Vivid Dreams    Family History  Problem Relation Age of Onset   Heart disease Mother    Cancer Father     Prior to Admission medications   Medication Sig Start Date End Date Taking? Authorizing Provider  acetaminophen (TYLENOL) 500 MG tablet Take 500 mg by mouth every 6 (six) hours as needed for moderate pain.   Yes [provider]  ALPRAZolam (XANAX) 0.5 MG tablet Take 0.25-0.5 mg by mouth 3 (three) times daily as needed for anxiety or sleep. 01/31/21  Yes [provider]  antiseptic oral rinse (BIOTENE) LIQD 15 mLs by Mouth Rinse route as needed for dry mouth.   Yes [provider]  atorvastatin (LIPITOR) 80 MG tablet TAKE 1 TABLET BY MOUTH EVERY DAY Patient taking differently: Take 80 mg by mouth daily. 02/11/21  Yes Belva Crome, MD  budesonide (ENTOCORT EC) 3 MG 24 hr capsule Take 3-6 mg by mouth See admin instructions. Takes 2 capsules (6 mg) by mouth in the morning and take 1 capsule (3 mg) by mouth at night   Yes [provider]  cholecalciferol (VITAMIN D3) 25 MCG (1000 UNIT) tablet Take 1,000 Units by mouth in the morning.   Yes [provider]  clopidogrel (PLAVIX) 75 MG tablet TAKE 1 TABLET BY MOUTH EVERY DAY Patient taking differently: Take 75 mg by mouth daily. 08/27/20  Yes Waynetta Sandy, MD  gabapentin (NEURONTIN) 100 MG capsule Take 2 capsules (200 mg total) by mouth 3 (three) times daily. 03/07/21  Yes Lars Pinks M, PA-C  HYDROcodone-acetaminophen (NORCO) 10-325 MG tablet Take 1 tablet by mouth every 4 (four) hours as needed for moderate pain or severe pain. 04/10/21  Yes [provider]  hyoscyamine (LEVSIN, ANASPAZ) 0.125 MG tablet Take 0.125 mg by mouth every 4 (four) hours as needed for cramping.    Yes [provider]  isosorbide mononitrate (IMDUR) 30 MG 24 hr tablet Take 1 tablet (30 mg  total) by mouth daily. Patient taking differently: Take 30 mg by mouth daily as needed (chest pain). 10/05/20 10/05/21 Yes Bhagat, Bhavinkumar, PA  Lidocaine (ASPERCREME LIDOCAINE) 4 % PTCH Apply 1 patch topically as needed (pain).   Yes [provider]  metaxalone (SKELAXIN) 800 MG tablet Take 800 mg by mouth 3 (three) times daily as needed for muscle spasms.   Yes [provider]  metoprolol succinate (TOPROL-XL) 50 MG 24 hr tablet TAKE 1 TABLET BY MOUTH EVERY DAY AFTER A MEAL Patient taking differently: 50 mg daily. 02/15/21  Yes Belva Crome, MD  nitroGLYCERIN (NITROSTAT) 0.4 MG SL tablet Place 0.4 mg under the tongue every 5 (five) minutes as needed for chest pain.   Yes [provider]  pantoprazole (PROTONIX) 40 MG tablet TAKE 1 TABLET BY MOUTH DAILY BEFORE BREAKFAST Patient taking differently: Take 40 mg by mouth daily before breakfast. 01/01/21  Yes Belva Crome, MD  PARoxetine (PAXIL) 40 MG tablet Take 40 mg by mouth in the morning. 01/05/15  Yes [provider]  Probiotic Product (PROBIOTIC PO) Take 1 capsule by mouth daily.   Yes  [provider]  sodium chloride ([place]AN) 0.65 % SOLN nasal spray Place 1 spray into both nostrils as needed for congestion.   Yes [provider]  tamsulosin (FLOMAX) 0.4 MG CAPS capsule Take 0.4 mg by mouth 2 (two) times daily. 08/18/14  Yes [provider]  valsartan (DIOVAN) 320 MG tablet Take 0.5 tablets (160 mg total) by mouth daily. 03/07/21  Yes Lars Pinks M, PA-C  levofloxacin (LEVAQUIN) 750 MG tablet Take 1 tablet (750 mg total) by mouth daily. Patient not taking: Reported on 04/19/2021 04/09/21   Marshell Garfinkel, MD  predniSONE (DELTASONE) 20 MG tablet Take 1 tablet (20 mg total) by mouth daily with breakfast. Patient not taking: Reported on 04/19/2021 04/09/21   Marshell Garfinkel, MD    Physical Exam: Vitals:   04/19/21 1430 04/19/21 1500 04/19/21 1510 04/19/21 1526  BP: 104/68 121/73  126/78  Pulse: 61  60 68  Resp: (!) 22 14 17 18   Temp:      TempSrc:      SpO2: 100%  97% 99%  Weight:      Height:       General: 71 y.o. male resting in bed in NAD Eyes: PERRL, normal sclera ENMT: Nares patent w/o discharge, orophaynx clear, dentition normal, ears w/o discharge/lesions/ulcers Neck: Supple, trachea midline Cardiovascular: RRR, +S1, S2, no m/g/r, equal pulses throughout Respiratory: rhales noted RUL, catheter notes RU chest, normal WOB GI: BS+, NDNT, no masses noted, no organomegaly noted MSK: No e/c/c Neuro: A&O x 3, no focal deficits Psyc: Appropriate interaction and affect, calm/cooperative  Data Reviewed:  WBC  13.6  CXR: 1. Right chest tube is identified. There is interval increase of moderate right pneumothorax extending to the level of right fifth rib. 2. Patchy consolidation of the right lung base. Mild patchy consolidation of left lung base is improved compared prior exam.  EKG: sinus, no st elevations  Assessment and Plan: No notes have been filed under this hospital service. Service: Hospitalist RUL adenocarcinoma s/p  robotic resection ILD Recurrent PTX     - admit to inpt, tele     - PCCM onboard for CT management, appreciate their assistance, plan per them  HLD     - continue home regimen  Anxiety     - continue home regimen    BPH     - continue  home regimen  CAD s/p CABG PAD HTN     - continue home regimen  Advance Care Planning:   Code Status: FULL  Consults: PCCM  Family Communication: None at bedside  Severity of Illness: The appropriate patient status for this patient is INPATIENT. Inpatient status is judged to be reasonable and necessary in order to provide the required intensity of service to ensure the patient's safety. The patient's presenting symptoms, physical exam findings, and initial radiographic and laboratory data in the context of their chronic comorbidities is felt to place them at high risk for further clinical deterioration. Furthermore, it is not anticipated that the patient will be medically stable for discharge from the hospital within 2 midnights of admission.   * I certify that at the point of admission it is my clinical judgment that the patient will require inpatient hospital care spanning beyond 2 midnights from the point of admission due to high intensity of service, high risk for further deterioration and high frequency of surveillance required.*  Author: Jonnie Finner, DO 04/19/2021 3:41 PM  For on call review [email or web address].

## 2021-04-20 ENCOUNTER — Inpatient Hospital Stay (HOSPITAL_COMMUNITY): Payer: PPO

## 2021-04-20 DIAGNOSIS — I959 Hypotension, unspecified: Secondary | ICD-10-CM

## 2021-04-20 DIAGNOSIS — C3490 Malignant neoplasm of unspecified part of unspecified bronchus or lung: Secondary | ICD-10-CM

## 2021-04-20 DIAGNOSIS — J9312 Secondary spontaneous pneumothorax: Secondary | ICD-10-CM | POA: Diagnosis not present

## 2021-04-20 DIAGNOSIS — J939 Pneumothorax, unspecified: Principal | ICD-10-CM

## 2021-04-20 DIAGNOSIS — I739 Peripheral vascular disease, unspecified: Secondary | ICD-10-CM

## 2021-04-20 LAB — COMPREHENSIVE METABOLIC PANEL
ALT: 20 U/L (ref 0–44)
AST: 21 U/L (ref 15–41)
Albumin: 3.1 g/dL — ABNORMAL LOW (ref 3.5–5.0)
Alkaline Phosphatase: 69 U/L (ref 38–126)
Anion gap: 6 (ref 5–15)
BUN: 16 mg/dL (ref 8–23)
CO2: 28 mmol/L (ref 22–32)
Calcium: 8.5 mg/dL — ABNORMAL LOW (ref 8.9–10.3)
Chloride: 100 mmol/L (ref 98–111)
Creatinine, Ser: 0.69 mg/dL (ref 0.61–1.24)
GFR, Estimated: >60 mL/min (ref >60)
Glucose, Bld: 108 mg/dL — ABNORMAL HIGH (ref 70–99)
Potassium: 4.2 mmol/L (ref 3.5–5.1)
Sodium: 134 mmol/L — ABNORMAL LOW (ref 135–145)
Total Bilirubin: 0.5 mg/dL (ref 0.3–1.2)
Total Protein: 6 g/dL — ABNORMAL LOW (ref 6.5–8.1)

## 2021-04-20 LAB — CBC
HCT: 44.4 % (ref 39.0–52.0)
Hemoglobin: 15.1 g/dL (ref 13.0–17.0)
MCH: 32.1 pg (ref 26.0–34.0)
MCHC: 34 g/dL (ref 30.0–36.0)
MCV: 94.5 fL (ref 80.0–100.0)
Platelets: 296 10*3/uL (ref 150–400)
RBC: 4.7 MIL/uL (ref 4.22–5.81)
RDW: 12.8 % (ref 11.5–15.5)
WBC: 11.1 10*3/uL — ABNORMAL HIGH (ref 4.0–10.5)
nRBC: 0 % (ref 0.0–0.2)

## 2021-04-20 MED ORDER — PANTOPRAZOLE SODIUM 40 MG PO TBEC
40.0000 mg | DELAYED_RELEASE_TABLET | Freq: Every day | ORAL | Status: DC
  Administered 2021-04-20 – 2021-04-21 (×2): 40 mg via ORAL
  Filled 2021-04-20 (×2): qty 1

## 2021-04-20 MED ORDER — ATORVASTATIN CALCIUM 40 MG PO TABS
80.0000 mg | ORAL_TABLET | Freq: Every day | ORAL | Status: DC
  Administered 2021-04-20 – 2021-04-21 (×2): 80 mg via ORAL
  Filled 2021-04-20 (×2): qty 2

## 2021-04-20 MED ORDER — BIOTENE DRY MOUTH MT LIQD: 15.0000 mL | OROMUCOSAL | Status: DC | PRN

## 2021-04-20 MED ORDER — METOPROLOL SUCCINATE ER 50 MG PO TB24
50.0000 mg | ORAL_TABLET | Freq: Every day | ORAL | Status: DC
  Administered 2021-04-20 – 2021-04-21 (×2): 50 mg via ORAL
  Filled 2021-04-20 (×2): qty 1

## 2021-04-20 MED ORDER — GABAPENTIN 100 MG PO CAPS
200.0000 mg | ORAL_CAPSULE | Freq: Three times a day (TID) | ORAL | Status: DC
  Administered 2021-04-20 – 2021-04-21 (×4): 200 mg via ORAL
  Filled 2021-04-20 (×4): qty 2

## 2021-04-20 MED ORDER — TAMSULOSIN HCL 0.4 MG PO CAPS
0.4000 mg | ORAL_CAPSULE | Freq: Every day | ORAL | Status: DC
Start: 2021-04-21 — End: 2021-04-21
  Administered 2021-04-21: 0.4 mg via ORAL
  Filled 2021-04-20: qty 1

## 2021-04-20 MED ORDER — TRAZODONE HCL 50 MG PO TABS: 50.0000 mg | ORAL_TABLET | Freq: Every evening | ORAL | Status: DC | PRN

## 2021-04-20 MED ORDER — ACETAMINOPHEN 500 MG PO TABS: 500.0000 mg | ORAL_TABLET | Freq: Four times a day (QID) | ORAL | Status: DC | PRN

## 2021-04-20 MED ORDER — ALPRAZOLAM 0.25 MG PO TABS
0.2500 mg | ORAL_TABLET | Freq: Three times a day (TID) | ORAL | Status: DC | PRN
  Administered 2021-04-20 – 2021-04-21 (×3): 0.25 mg via ORAL
  Filled 2021-04-20: qty 2
  Filled 2021-04-20 (×2): qty 1

## 2021-04-20 MED ORDER — GUAIFENESIN ER 600 MG PO TB12
1200.0000 mg | ORAL_TABLET | Freq: Two times a day (BID) | ORAL | Status: DC
  Administered 2021-04-20 – 2021-04-21 (×3): 1200 mg via ORAL
  Filled 2021-04-20 (×3): qty 2

## 2021-04-20 MED ORDER — BUDESONIDE 3 MG PO CPEP
3.0000 mg | ORAL_CAPSULE | Freq: Every day | ORAL | Status: DC
  Administered 2021-04-20: 3 mg via ORAL
  Filled 2021-04-20: qty 1

## 2021-04-20 MED ORDER — HYDROCODONE-ACETAMINOPHEN 10-325 MG PO TABS
1.0000 | ORAL_TABLET | ORAL | Status: DC | PRN
  Administered 2021-04-20 (×3): 1 via ORAL
  Filled 2021-04-20 (×3): qty 1

## 2021-04-20 MED ORDER — PAROXETINE HCL 20 MG PO TABS
40.0000 mg | ORAL_TABLET | Freq: Every morning | ORAL | Status: DC
  Administered 2021-04-20 – 2021-04-21 (×2): 40 mg via ORAL
  Filled 2021-04-20 (×2): qty 2

## 2021-04-20 MED ORDER — SALINE SPRAY 0.65 % NA SOLN
1.0000 | NASAL | Status: DC | PRN
  Filled 2021-04-20: qty 44

## 2021-04-20 MED ORDER — HYOSCYAMINE SULFATE 0.125 MG PO TBDP
0.1250 mg | ORAL_TABLET | ORAL | Status: DC | PRN
  Filled 2021-04-20: qty 1

## 2021-04-20 MED ORDER — CLOPIDOGREL BISULFATE 75 MG PO TABS
75.0000 mg | ORAL_TABLET | Freq: Every day | ORAL | Status: DC
Start: 2021-04-20 — End: 2021-04-21
  Administered 2021-04-20 – 2021-04-21 (×2): 75 mg via ORAL
  Filled 2021-04-20 (×3): qty 1

## 2021-04-20 MED ORDER — TAMSULOSIN HCL 0.4 MG PO CAPS
0.4000 mg | ORAL_CAPSULE | Freq: Two times a day (BID) | ORAL | Status: DC
Start: 2021-04-20 — End: 2021-04-20
  Administered 2021-04-20: 0.4 mg via ORAL
  Filled 2021-04-20: qty 1

## 2021-04-20 MED ORDER — VITAMIN D 25 MCG (1000 UNIT) PO TABS
1000.0000 [IU] | ORAL_TABLET | Freq: Every morning | ORAL | Status: DC
  Administered 2021-04-20 – 2021-04-21 (×2): 1000 [IU] via ORAL
  Filled 2021-04-20 (×2): qty 1

## 2021-04-20 MED ORDER — METAXALONE 800 MG PO TABS
800.0000 mg | ORAL_TABLET | Freq: Three times a day (TID) | ORAL | Status: DC | PRN
  Filled 2021-04-20: qty 1

## 2021-04-20 MED ORDER — SODIUM CHLORIDE 0.9 % IV BOLUS
250.0000 mL | Freq: Once | INTRAVENOUS | Status: AC
  Administered 2021-04-20: 250 mL via INTRAVENOUS

## 2021-04-20 MED ORDER — BUDESONIDE 3 MG PO CPEP
6.0000 mg | ORAL_CAPSULE | Freq: Every day | ORAL | Status: DC
  Administered 2021-04-20 – 2021-04-21 (×2): 6 mg via ORAL
  Filled 2021-04-20 (×3): qty 2

## 2021-04-20 MED ORDER — IRBESARTAN 150 MG PO TABS
150.0000 mg | ORAL_TABLET | Freq: Every day | ORAL | Status: DC
  Administered 2021-04-20: 150 mg via ORAL
  Filled 2021-04-20 (×2): qty 1

## 2021-04-20 NOTE — Hospital Course (Signed)
71 year old male with a history of ILD, s/p lobectomy for adenocarcinoma of the lung, had delayed right-sided pneumothorax.  It had resolved, unfortunately pneumothorax recurred and a pigtail chest tube was placed in the office with Heimlich valve as per pulmonology.  Patient had significant wound drainage from this and collected the fluid and gauze.  Patient taped the balloon to the end of Heimlich valve occluding any air release. After doing so patient had worsening dyspnea with sharp chest pain.  He called pulmonology office and they told him to go to ED for further evaluation. Chest tube was placed.

## 2021-04-20 NOTE — TOC Initial Note (Signed)
Transition of Care [facility]) - Initial/Assessment Note    Patient Details  Name: Richard Parrish MRN: [id number] Date of Birth: 24-Apr-1950  Transition of Care [facility]) CM/SW Contact:    Leeroy Cha, RN Phone Number: 04/20/2021, 8:24 AM  Clinical Narrative:                  Transition of Care [facility]) Screening Note   Patient Details  Name: Richard Parrish Date of Birth: [birth year]   Transition of Care [facility]) CM/SW Contact:    Leeroy Cha, RN Phone Number: 04/20/2021, 8:24 AM    Transition of Care Department [facility]) has reviewed patient and no TOC needs have been identified at this time. We will continue to monitor patient advancement through interdisciplinary progression rounds. If new patient transition needs arise, please place a TOC consult.    Expected Discharge Plan: [place] Barriers to Discharge: Continued Medical Work up   Patient Goals and CMS Choice Patient states their goals for this hospitalization and ongoing recovery are:: to go home CMS Medicare.gov Compare Post Acute Care list provided to:: Patient    Expected Discharge Plan and Services Expected Discharge Plan: [place]   Discharge Planning Services: CM Consult   Living arrangements for the past 2 months: Single Family Home                                      Prior Living Arrangements/Services Living arrangements for the past 2 months: Single Family Home Lives with:: Spouse Patient language and need for interpreter reviewed:: Yes Do you feel safe going back to the place where you live?: Yes            Criminal Activity/Legal Involvement Pertinent to Current Situation/Hospitalization: No - Comment as needed  Activities of Daily Living Home Assistive Devices/Equipment: Eyeglasses, Hearing aid, Other (Comment) (bilateral hearing aides, walking stick) ADL Screening (condition at time of admission) Patient's cognitive ability adequate to safely  complete daily activities?: Yes Is the patient deaf or have difficulty hearing?: Yes Does the patient have difficulty seeing, even when wearing glasses/contacts?: No Does the patient have difficulty concentrating, remembering, or making decisions?: No Patient able to express need for assistance with ADLs?: Yes Does the patient have difficulty dressing or bathing?: No Independently performs ADLs?: Yes (appropriate for developmental age) Does the patient have difficulty walking or climbing stairs?: Yes Weakness of Legs: Both Weakness of Arms/Hands: Both  Permission Sought/Granted                  Emotional Assessment Appearance:: Appears stated age Attitude/Demeanor/Rapport: Engaged Affect (typically observed): Calm Orientation: : Oriented to Place, Oriented to Self, Oriented to  Time, Oriented to Situation Alcohol / Substance Use: Not Applicable Psych Involvement: No (comment)  Admission diagnosis:  Pneumothorax [J93.9] Malignant neoplasm of lung, unspecified laterality, unspecified part of lung (HCC) [C34.90] Recurrent pneumothorax [J93.9] Patient Active Problem List   Diagnosis Date Noted   BPH (benign prostatic hyperplasia) 04/19/2021   Pneumothorax 04/13/2021   Tobacco user 03/19/2021   Severe major depression, single episode, without psychotic features ([place]) 03/19/2021   Sciatica 03/19/2021   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[place]) 03/19/2021   Obstructive sleep apnea syndrome 03/19/2021   Malignant neoplasm of upper lobe, right bronchus or lung ([place]) 03/19/2021   Lymphocytic colitis 03/19/2021   Low back pain 03/19/2021   Irritable bowel syndrome 03/19/2021  Insomnia 03/19/2021   Frequency of micturition 03/19/2021   Confusional state 03/19/2021   Chronic pain syndrome 03/19/2021   Gastroesophageal reflux disease 03/19/2021   Anxiety 03/19/2021   Amnesia 03/19/2021   Acquired absence of lung (part of) 03/19/2021   Malignant neoplasm of upper lobe of right lung ([place])  03/08/2021   Lung nodule 03/03/2021   S/P Robotic Assisted Right Video Assisted Thoracoscopy with Right Upper Lobectomy, Right Lower Lobe wedge resection 03/03/2021   Malnutrition of moderate degree 10/05/2020   Shortness of breath    Unstable angina ([place]) 10/03/2020   Chest pain    Hx of CABG    Disc degeneration, lumbar 02/10/2018   Aortic atherosclerosis ([place]) 11/13/2017   Pure hypercholesterolemia 11/13/2017   Degenerative joint disease of left hip 04/07/2017   Dog bite of face 08/08/2016   Right hip pain 06/01/2016   Achilles tendon pain 12/31/2015   Postinflammatory pulmonary fibrosis ([place]) 07/24/2013   Cough 07/24/2013   CAD (coronary artery disease), native coronary artery 04/01/2013   Essential hypertension, benign 12/12/2012    Class: Chronic   HLD (hyperlipidemia) 12/12/2012   Atherosclerosis of native arteries of the extremities with intermittent claudication 11/25/2011   Peripheral vascular disease, unspecified ([place]) 02/28/2011   PCP:  Seward Carol, MD Pharmacy:   [id number] - [place], [place] [address] Phone: [phone] Fax: [phone]     Social Determinants of Health (SDOH) Interventions    Readmission Risk Interventions No flowsheet data found.

## 2021-04-20 NOTE — Assessment & Plan Note (Addendum)
-   Patient developed orthostatic hypotension 2/21 as he stood up from bed -Patient orthostatic. BP improved with IVF hydration -Repeat orthostatics on 2/22 noted to be normal and pt reported feeling improved -We stopped Avapro, cut down Flomax 0.4 mg to once a day

## 2021-04-20 NOTE — Assessment & Plan Note (Addendum)
-   CAD s/p CABG; continue atorvastatin, Plavix, Toprol-XL - Remained stable

## 2021-04-20 NOTE — Assessment & Plan Note (Addendum)
-   Chest tube in place -Chest x-ray noted to be improved -Management per pulmonology. OK to d/c per PCCM

## 2021-04-20 NOTE — Assessment & Plan Note (Signed)
-   S/p bronchoscopy and lobectomy -Followed by pulmonology

## 2021-04-20 NOTE — Assessment & Plan Note (Signed)
-   Patient is on Flomax 0.4 mg twice daily -We will cut down the Flomax to 0.4 mg daily due to hypotension as above

## 2021-04-20 NOTE — Progress Notes (Addendum)
[facility]  PROGRESS NOTE  Richard Parrish MMH:[id number] DOB: December 27, 1950 DOA: 04/19/2021 PCP: Seward Carol, MD  Brief hospital course 71 year old male with a history of ILD, s/p lobectomy for adenocarcinoma of the lung, had delayed right-sided pneumothorax.  It had resolved, unfortunately pneumothorax recurred and a pigtail chest tube was placed in the office with Heimlich valve as per pulmonology.  Patient had significant wound drainage from this and collected the fluid and gauze.  Patient taped the balloon to the end of Heimlich valve occluding any air release. After doing so patient had worsening dyspnea with sharp chest pain.  He called pulmonology office and they told him to go to ED for further evaluation. Chest tube was placed.    Subjective   Today patient became hypotensive after he stood up from his bed, causing tachycardia and dizziness.  Symptoms improved after he was given 250 cc normal saline bolus.   Patient's symptoms have resolved.  Denies any chest pain but had chest tightness which also resolved.  EKG was unremarkable.     Assessment and Plan:  * Pneumothorax- (present on admission) - Chest tube in place -Chest x-ray this morning shows resolution of pneumothorax -Management per pulmonology  Hypotension - Patient developed orthostatic hypotension today, as he stood up from bed -Blood pressure dropped to 70s, heart rate went up to 130s -Resolved after he received 250 cc normal saline bolus -He does take metoprolol 50 mg daily for A-fib; received metoprolol this morning -EKG was unremarkable, denies chest pain but had some chest tightness which has resolved -We will discontinue Avapro, cut down Flomax 0.4 mg to once a day -Apply TED hose; up with assistance   BPH (benign prostatic hyperplasia) - Patient is on Flomax 0.4 mg twice daily -We will cut down the Flomax to 0.4 mg daily due to hypotension as above  Malignant neoplasm of upper lobe, right  bronchus or lung ([place])- (present on admission) - S/p bronchoscopy and lobectomy -Followed by pulmonology  CAD (coronary artery disease), native coronary artery- (present on admission) - CAD s/p CABG; continue atorvastatin, Plavix, Toprol-XL  HLD (hyperlipidemia) - Continue atorvastatin     Medications     atorvastatin  80 mg Oral Daily   budesonide  3 mg Oral QHS   budesonide  6 mg Oral Daily   cholecalciferol  1,000 Units Oral q AM   clopidogrel  75 mg Oral Daily   gabapentin  200 mg Oral TID   metoprolol succinate  50 mg Oral Daily   pantoprazole  40 mg Oral QAC breakfast   PARoxetine  40 mg Oral q AM   sodium chloride flush  10 mL Other Q8H   [START ON 04/21/2021] tamsulosin  0.4 mg Oral Daily     Data Reviewed:   CBG:  No results for input(s): GLUCAP in the last 168 hours.  SpO2: (!) 85 % O2 Flow Rate (L/min): 2 L/min    Vitals:   04/20/21 1404 04/20/21 1414 04/20/21 1417 04/20/21 1445  BP: (!) 84/54 (!) 86/63 92/63 107/74  Pulse:  86  (!) 123  Resp:    14  Temp:    98 F (36.7 C)  TempSrc:    Oral  SpO2:  99% 96% (!) 85%  Weight:      Height:          Data Reviewed:  Basic Metabolic Panel: Recent Labs  Lab 04/13/21 1629 04/13/21 1639 04/19/21 1146 04/20/21 0517  NA 134* 135 134* 134*  K  4.2 4.3 4.3 4.2  CL 97* 98 100 100  CO2 30  --  28 28  GLUCOSE 123* 116* 110* 108*  BUN 23 21 15 16   CREATININE 0.79 0.80 0.85 0.69  CALCIUM 9.5  --  8.7* 8.5*    CBC: Recent Labs  Lab 04/13/21 1629 04/13/21 1639 04/19/21 1146 04/20/21 0517  WBC 18.1*  --  13.6* 11.1*  NEUTROABS 16.2*  --  10.6*  --   HGB 16.2 16.3 15.6 15.1  HCT 47.5 48.0 46.2 44.4  MCV 94.6  --  94.9 94.5  PLT 436*  --  296 296    LFT Recent Labs  Lab 04/13/21 1629 04/20/21 0517  AST 28 21  ALT 33 20  ALKPHOS 84 69  BILITOT 0.8 0.5  PROT 7.6 6.0*  ALBUMIN 4.4 3.1*     Micro :     Antibiotics: Anti-infectives (From admission, onward)    None           DVT prophylaxis: SCDs  Code Status: Full code  Family Communication: No family at bedside  CONSULTS: Pulmonology    Objective    Physical Examination:   General-appears in no acute distress Heart-S1-S2, regular, no murmur auscultated Lungs-clear to auscultation bilaterally, no wheezing or crackles auscultated, chest tube in place Abdomen-soft, nontender, no organomegaly Extremities-no edema in the lower extremities Neuro-alert, oriented x3, no focal deficit noted  Status is: Inpatient: Pneumothorax        [facility] If 7PM-7AM, please contact night-coverage at [email or web address], Office  [phone]   04/20/2021, 4:27 PM  LOS: 1 day

## 2021-04-20 NOTE — Consult Note (Signed)
NAME:  Richard Parrish, MRN:  [id number], DOB:  [birth year], LOS: 1 ADMISSION DATE:  04/19/2021, CONSULTATION DATE:  2/20 REFERRING MD:  Dr. Melina Copa, CHIEF COMPLAINT:  Drainage from chest tube    History of Present Illness:  71 y/o M who presented to the [facility] ER on 2/20 with reports of malfunctioning chest tube.   The patient is followed by Dr. Vaughan Browner for ILD, with non-specific elevation elevation of ANCA, no evidence of connective tissue disease. 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  He had a 2cm RUL pulmonary nodule and on 1/4 underwent a fiberoptic bronchoscopy with robotic assistance for lung biospy, VATS thoracoscopy with RUL lobectomy, RLL wedge resection, lymph node biopsy and intercostal nerve block.  His post operative course was uneventful while inpatient and he was discharged home on 1/8.  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 RLL wedge resection was consistent with acute inflammatory  cells thought to be bronchopneumonia.  Recommendations post hospitalization from Dr. Julien Nordmann / ONC were for observation.    He was admitted 2/14-2/15 with increasing shortness of breath in the setting of right sided pneumothorax.  He underwent aspiration of pneumothorax with resolution of pneumothorax. He was discharged 2/15 with follow up in the pulmonary clinic on 2/16 with 2V CXR which showed recurrent pneumothorax.  In office, he had a chest tube placed with Heimlich valve.    The patient retuns 2/20 with reports of increased drainage from the chest tube at home.  He has several oz's of drainage from the chest tube. The patient covered the Heimlich valve tightly with a balloon. He noted when he would take a deep breath and bear down, the balloon would fill with air.  He subsequently developed chest pain prompting ER evaluation.   PCCM consulted for consultation.    Pertinent  Medical History  CAD s/p MI  CABG - DVT to RCA at age 71 HTN HLD  PAD /  PVD OSA on CPAP UIP / IPF  BPH C-Difficile  Osteoarthritis  COVID - 04/2019 Former Smoker - quit [facility] Events: Including procedures, antibiotic start and stop dates in addition to other pertinent events   2/20 Admit with drainage from chest tube  Interim History / Subjective:   04/20/21 - On room air. APP placed to MiniVac Express drainage. PAtient discharge held due to low BP. Currently meds held. MAP 73. SBP 90s. H eis asking for mucinex  Objective   Blood pressure 93/65, pulse 67, temperature 97.7 F (36.5 C), temperature source Oral, resp. rate 18, height 6' (1.829 m), weight 93.2 kg, SpO2 100 %.        Intake/Output Summary (Last 24 hours) at 04/20/2021 1906 Last data filed at 04/20/2021 1800 Gross per 24 hour  Intake 410 ml  Output 1450 ml  Net -1040 ml   Filed Weights   04/19/21 1121  Weight: 93.2 kg    Examination: Looks stable Has Right sided chest tube RT crackles CTA bilaterally Abd soft Normal heart sounds AxOx3   Resolved Hospital Problem list     Assessment & Plan:   Pneumothorax s/p Chest Tube Placement  UIP / IPF  Recent VATS Thoracoscopy with RUL Lobectomy, RLL Wedge Resection (1/24) - Mixed invasive mucinous and non-mucinous adenocarcinoma, grade 2  (moderately differentiated), 2.2 cm in greatest dimension Chest tube placed on 2/16 in pulmonary clinic with Heimlich valve.  Initially had good response. Unfortunately, it sounds as if the Heimlich valve was  taped shut and was unable to allow air to exit creating secondary PTX. S/p Minivac express 04/20/21  Plan  - continue minivac express   - OPD followup Dr Mallie Darting    Dr. Brand Males, M.D., F.C.C.P,  Pulmonary and Critical Care Medicine Staff Physician, [place] Director - Interstitial Lung Disease  Program  Pulmonary [place] at [place], [place], [address]  NPI Number:  NPI  #[id number] [facility] Number: [id number]  Pager: [phone], If no answer  -> Check AMION or Try [phone] Telephone (clinical office): [phone] Telephone (research): [phone]  7:13 PM 04/20/2021

## 2021-04-20 NOTE — Progress Notes (Signed)
Chest tube dressing changed at bedside with sterile technique.  [place] drainage system changed to MiniVac Express drainage system at bedside. Site secured.  Patient's wife educated on how to drain the system if needed (clean site with alcohol, use luer lock syringe to drain, do not touch tip of syringe or site after cleaned). Instructions as below for discharge / when cleared to go.     Do no submerge in water > no bath or hot tubs while chest tube in place [facility] to drain system with syringes provided.  Use one syringe (push and twist onto small blue button valve at the bottom of the drainage system and withdrawal fluid) for one "session" for drainage and discard.  Record total amount drained in one "session" and take record with you to the office for Dr. Kipp Brood.   Keep chest tube drainage system below the level of the heart  Keep dressing intact on chest / do no remove Do not inject anything into the system or use needles in the system Follow up with Dr. Kipp Brood on Friday as previously arranged  If the chest tube became dislodged, cover with dry gauze & tape, then apply gentle pressure to site.   Call the office or return to the ER if worsening chest pain, SOB, dislodgement of tube         Noe Gens, MSN, APRN, NP-C, AGACNP-BC [facility] 04/20/2021, 5:25 PM   Please see [email or web address] for pager details.   From 7A-7P if no response, please call [phone] After hours, please call ELink [phone]

## 2021-04-20 NOTE — Discharge Instructions (Addendum)
Do no submerge in water > no bath or hot tubs while chest tube in place [facility] to drain system with syringes provided.  Use one syringe (push and twist onto small blue button valve at the bottom of the drainage system and withdrawal fluid) for one "session" for drainage and discard.  Record total amount drained in one "session" and take record with you to the office for Dr. Kipp Brood.   Keep chest tube drainage system below the level of the heart  Keep dressing intact on chest / do no remove Do not inject anything into the system or use needles in the system Follow up with Dr. Kipp Brood on Friday as previously arranged  If the chest tube became dislodged, cover with dry gauze & tape, then apply gentle pressure to site.   Call the office or return to the ER if worsening chest pain, SOB, dislodgement of tube

## 2021-04-20 NOTE — Assessment & Plan Note (Signed)
Continue atorvastatin

## 2021-04-21 ENCOUNTER — Inpatient Hospital Stay (HOSPITAL_COMMUNITY): Payer: PPO

## 2021-04-21 DIAGNOSIS — I10 Essential (primary) hypertension: Secondary | ICD-10-CM

## 2021-04-21 LAB — CBC
HCT: 44.1 % (ref 39.0–52.0)
Hemoglobin: 14.7 g/dL (ref 13.0–17.0)
MCH: 32 pg (ref 26.0–34.0)
MCHC: 33.3 g/dL (ref 30.0–36.0)
MCV: 96.1 fL (ref 80.0–100.0)
Platelets: 278 10*3/uL (ref 150–400)
RBC: 4.59 MIL/uL (ref 4.22–5.81)
RDW: 12.9 % (ref 11.5–15.5)
WBC: 11.1 10*3/uL — ABNORMAL HIGH (ref 4.0–10.5)
nRBC: 0 % (ref 0.0–0.2)

## 2021-04-21 LAB — BASIC METABOLIC PANEL
Anion gap: 5 (ref 5–15)
BUN: 15 mg/dL (ref 8–23)
CO2: 28 mmol/L (ref 22–32)
Calcium: 8.7 mg/dL — ABNORMAL LOW (ref 8.9–10.3)
Chloride: 102 mmol/L (ref 98–111)
Creatinine, Ser: 0.77 mg/dL (ref 0.61–1.24)
GFR, Estimated: >60 mL/min (ref >60)
Glucose, Bld: 114 mg/dL — ABNORMAL HIGH (ref 70–99)
Potassium: 4.6 mmol/L (ref 3.5–5.1)
Sodium: 135 mmol/L (ref 135–145)

## 2021-04-21 MED ORDER — ENSURE ENLIVE PO LIQD: 237.0000 mL | Freq: Two times a day (BID) | ORAL | Status: DC

## 2021-04-21 MED ORDER — BOOST / RESOURCE BREEZE PO LIQD CUSTOM: 1.0000 | ORAL | Status: DC

## 2021-04-21 MED ORDER — ADULT MULTIVITAMIN W/MINERALS CH: 1.0000 | ORAL_TABLET | Freq: Every day | ORAL | Status: DC

## 2021-04-21 NOTE — Progress Notes (Signed)
NAME:  Richard Parrish, MRN:  [id number], DOB:  1951/02/16, LOS: 2 ADMISSION DATE:  04/19/2021, CONSULTATION DATE:  2/20 REFERRING MD:  Dr. Melina Copa, CHIEF COMPLAINT:  Drainage from chest tube    History of Present Illness:  71 y/o M who presented to the [facility] ER on 2/20 with reports of malfunctioning chest tube.   The patient is followed by Dr. Vaughan Browner for ILD, with non-specific elevation elevation of ANCA, no evidence of connective tissue disease. 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  He had a 2cm RUL pulmonary nodule and on 1/4 underwent a fiberoptic bronchoscopy with robotic assistance for lung biospy, VATS thoracoscopy with RUL lobectomy, RLL wedge resection, lymph node biopsy and intercostal nerve block.  His post operative course was uneventful while inpatient and he was discharged home on 1/8.  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 RLL wedge resection was consistent with acute inflammatory cells thought to be bronchopneumonia.  Recommendations post hospitalization from Dr. Julien Nordmann / ONC were for observation.    He was admitted 2/14-2/15 with increasing shortness of breath in the setting of right sided pneumothorax.  He underwent aspiration of pneumothorax with resolution of pneumothorax. He was discharged 2/15 with follow up in the pulmonary clinic on 2/16 with 2V CXR which showed recurrent pneumothorax.  In office, he had a chest tube placed with Heimlich valve.    The patient retuns 2/20 with reports of increased drainage from the chest tube at home.  He has several oz's of drainage from the chest tube. The patient covered the Heimlich valve tightly with a balloon. He noted when he would take a deep breath and bear down, the balloon would fill with air.  He subsequently developed chest pain prompting ER evaluation.   PCCM consulted for consultation.    Pertinent  Medical History  CAD s/p MI  CABG - DVT to RCA at age 62 HTN HLD  PAD /  PVD OSA on CPAP UIP / IPF  BPH C-Difficile  Osteoarthritis  COVID - 04/2019 Former Smoker - quit [facility] Events: Including procedures, antibiotic start and stop dates in addition to other pertinent events   2/20 Admit with drainage from chest tube 2/22 No acute events overnight, CXR this am stable  Interim History / Subjective:  Seen lying in bed with no acute complaints  Wife at bedside and updated   Objective   Blood pressure 129/82, pulse 70, temperature 97.7 F (36.5 C), temperature source Oral, resp. rate 18, height 6' (1.829 m), weight 93.2 kg, SpO2 99 %.        Intake/Output Summary (Last 24 hours) at 04/21/2021 0940 Last data filed at 04/21/2021 0844 Gross per 24 hour  Intake 1120 ml  Output 2600 ml  Net -1480 ml   Filed Weights   04/19/21 1121  Weight: 93.2 kg    Examination: General: Well appearing elderly male lying in bed in NAD HEENT: ETT, MM pink/moist, PERRL,  Neuro: Slightly sleepy but Alert and oriented x3 CV: s1s2 regular rate and rhythm, no murmur, rubs, or gallops,  PULM:  Clear to ascultation bilaterally, no increased work of breathing, no added breath sounds  GI: soft, bowel sounds active in all 4 quadrants, non-tender, non-distended, tolerating oral diet Extremities: warm/dry, no edema  Skin: no rashes or lesions  Resolved Hospital Problem list     Assessment & Plan:   Pneumothorax s/p Chest Tube Placement  UIP / IPF  Recent VATS  Thoracoscopy with RUL Lobectomy, RLL Wedge Resection (1/24) -Chest tube placed on 2/16 in pulmonary clinic with Heimlich valve.  Initially had good response. Unfortunately, it sounds as if the Heimlich valve was taped shut and was unable to allow air to exit creating secondary PTX.  -Repeat CXR this am stable  P: Continue chest tube to mini express  Will need to follow up with Dr. Kipp Brood on discharge Stable from pulmonary stand point for discharge  Mini-express care instructions added to  discharge instructions  Wife states she feels comfortable with min express care  Mobilize as able   Best Practice (right click and "Reselect all SmartList Selections" daily)  Per Primary -  Code Status:  full code Last date of multidisciplinary goals of care discussion n/a  Critical care time: n/a   Jazmyne Beauchesne D. Kenton Kingfisher, NP-C [facility] Personal contact information can be found on Amion  04/21/2021, 9:43 AM

## 2021-04-21 NOTE — Progress Notes (Signed)
Pt dc stable and WNL via wheelchair. RN went over AVS instructions with pt and wife at bedside. Pt and wife both verbalized understanding of plan of care. Pt and wife had no further questions at this time.

## 2021-04-21 NOTE — Progress Notes (Signed)
Initial Nutrition Assessment RD working remotely.  DOCUMENTATION CODES:   Not applicable  INTERVENTION:  - will order Boost Breeze once/day, each supplement provides 250 kcal and 9 grams of protein. - will order Ensure Plus High Protein BID, each supplement provides 350 kcal and 20 grams of protein. - will order 1 tablet multivitamin with minerals daily - liberalize diet from Heart Healthy to Regular (cleared by MD). - complete NFPE when feasible.    NUTRITION DIAGNOSIS:   Increased nutrient needs related to cancer and cancer related treatments, acute illness as evidenced by estimated needs.  GOAL:   Patient will meet greater than or equal to 90% of their needs  MONITOR:   PO intake, Supplement acceptance, Labs, Weight trends  REASON FOR ASSESSMENT:   Malnutrition Screening Tool  ASSESSMENT:   71 y.o. male with medical history of RUL adenocarcinoma of the lung, ILD, HTN, HLD, CAD s/p CABG, PAD, anxiety, depression, BPH, GERD, spinal stenosis, MI, PVD, OSA on CPAP, and arthritis. He presented to the ED with dyspnea and chest pain. He had chest tube with Heimlich valve placed in Pulmonology office on 2/16 and was experiencing increased output from tube at home. He was admitted with dx of pneumothorax.  No meal completion percentages documented since admission.   Weight on 2/20 was 205 lb and weight on 03/19/21 was 210 lb. This indicates 5 lb weight loss (2.4% body weight) in the past 1 month; not significant for time frame. Weight on 02/10/21 was 216 lb which indicates 11 lb weight loss (5.1% body weight) in 2 months; not significant for time frame.  He underwent thoracentesis on 2/14 and 400 ml of air was removed before patient began experiencing severe pleurisy/pain and procedure stopped.  Patient was seen by a [place] RD on 10/05/20 at which time he was found to meet criteria for moderate malnutrition in the context of chronic illness as evidenced by mild fat depletions and  mild muscle depletions. Weight on that date was 214 lb.   RD note from 8/8 states that patient was on Weight Watchers and was focused on losing weight, but that appetite had been decreased and for a one month time frame leading up to that admission, he was only eating 1 meal/day.   Per notes: - admitted 2/14-2/15 due to R-sided pneumothorax--followed up outpatient with Pulmonology on 2/16 and had chest tube with Heimlich valve placed - returned to the ED on 2/20 due to increased drainage from chest tube - VATS thoracoscopy with RUL lobectomy and RLL wedge resection on 03/23/21   Labs reviewed; Ca: 8.7 mg/dl.  Medications reviewed; 1000 units cholecalciferol/day, 40 mg oral protonix/day.    NUTRITION - FOCUSED PHYSICAL EXAM:  RD working remotely.  Diet Order:   Diet Order             Diet regular Room service appropriate? Yes; Fluid consistency: Thin  Diet effective now                   EDUCATION NEEDS:   No education needs have been identified at this time  Skin:  Skin Assessment: Reviewed RN Assessment  Last BM:  PTA/unknown  Height:   Ht Readings from Last 1 Encounters:  04/19/21 6' (1.829 m)    Weight:   Wt Readings from Last 1 Encounters:  04/19/21 93.2 kg    BMI:  Body mass index is 27.86 kg/m.  Estimated Nutritional Needs:  Kcal:  2300-2500 kcal Protein:  115-130 grams Fluid:  >/=  2.3 L/day      Jarome Matin, MS, RD, LDN Inpatient Clinical Dietitian RD pager # available in [place]  After hours/weekend pager # available in [facility]

## 2021-04-21 NOTE — Discharge Summary (Signed)
Physician Discharge Summary   Patient: Richard Parrish MRN: [id number] DOB: [birth year]  Admit date:     04/19/2021  Discharge date: 04/21/21  Discharge Physician: Marylu Lund   PCP: Seward Carol, MD   Recommendations at discharge:    Follow up with PCP in 1-2 weeks Follow up with Cardiology as scheduled Please monitor BP and adjust be meds accordingly. Valsartan on hold at time of d/c given low BP this visit, consider resuming later as BP tolerates  Discharge Diagnoses: Principal Problem:   Pneumothorax Active Problems:   Peripheral vascular disease, unspecified ([place])   Essential hypertension, benign   HLD (hyperlipidemia)   CAD (coronary artery disease), native coronary artery   Malignant neoplasm of upper lobe, right bronchus or lung (HCC)   Anxiety   BPH (benign prostatic hyperplasia)   Hypotension  Resolved Problems:   * No resolved hospital problems. *   Hospital Course: 71 year old male with a history of ILD, s/p lobectomy for adenocarcinoma of the lung, had delayed right-sided pneumothorax.  It had resolved, unfortunately pneumothorax recurred and a pigtail chest tube was placed in the office with Heimlich valve as per pulmonology.  Patient had significant wound drainage from this and collected the fluid and gauze.  Patient taped the balloon to the end of Heimlich valve occluding any air release. After doing so patient had worsening dyspnea with sharp chest pain.  He called pulmonology office and they told him to go to ED for further evaluation. Chest tube was placed.  Assessment and Plan: * Pneumothorax- (present on admission) - Chest tube in place -Chest x-ray noted to be improved -Management per pulmonology. OK to d/c per PCCM  Hypotension - Patient developed orthostatic hypotension 2/21 as he stood up from bed -Patient orthostatic. BP improved with IVF hydration -Repeat orthostatics on 2/22 noted to be normal and pt reported feeling improved -We stopped  Avapro, cut down Flomax 0.4 mg to once a day   BPH (benign prostatic hyperplasia) - Patient is on Flomax 0.4 mg twice daily -We will cut down the Flomax to 0.4 mg daily due to hypotension as above  Malignant neoplasm of upper lobe, right bronchus or lung ([place])- (present on admission) - S/p bronchoscopy and lobectomy -Followed by pulmonology  CAD (coronary artery disease), native coronary artery- (present on admission) - CAD s/p CABG; continue atorvastatin, Plavix, Toprol-XL - Remained stable  HLD (hyperlipidemia) - Continue atorvastatin        Consultants: PCCM Procedures performed:   Disposition: Home Diet recommendation:  Regular diet  DISCHARGE MEDICATION: Allergies as of 04/21/2021       Reactions   Ambien [zolpidem Tartrate] Other (See Comments)   Bad dreams/Vivid Dreams        Medication List     STOP taking these medications    levofloxacin 750 MG tablet Commonly known as: LEVAQUIN   predniSONE 20 MG tablet Commonly known as: DELTASONE   valsartan 320 MG tablet Commonly known as: DIOVAN       TAKE these medications    acetaminophen 500 MG tablet Commonly known as: TYLENOL Take 500 mg by mouth every 6 (six) hours as needed for moderate pain.   ALPRAZolam 0.5 MG tablet Commonly known as: XANAX Take 0.25-0.5 mg by mouth 3 (three) times daily as needed for anxiety or sleep.   antiseptic oral rinse Liqd 15 mLs by Mouth Rinse route as needed for dry mouth.   Aspercreme Lidocaine 4 % Ptch Generic drug: Lidocaine Apply 1 patch topically as needed (  pain).   atorvastatin 80 MG tablet Commonly known as: LIPITOR TAKE 1 TABLET BY MOUTH EVERY DAY   budesonide 3 MG 24 hr capsule Commonly known as: ENTOCORT EC Take 3-6 mg by mouth See admin instructions. Takes 2 capsules (6 mg) by mouth in the morning and take 1 capsule (3 mg) by mouth at night   cholecalciferol 25 MCG (1000 UNIT) tablet Commonly known as: VITAMIN D3 Take 1,000 Units by mouth  in the morning.   clopidogrel 75 MG tablet Commonly known as: PLAVIX TAKE 1 TABLET BY MOUTH EVERY DAY   gabapentin 100 MG capsule Commonly known as: NEURONTIN Take 2 capsules (200 mg total) by mouth 3 (three) times daily.   HYDROcodone-acetaminophen 10-325 MG tablet Commonly known as: NORCO Take 1 tablet by mouth every 4 (four) hours as needed for moderate pain or severe pain.   hyoscyamine 0.125 MG tablet Commonly known as: LEVSIN Take 0.125 mg by mouth every 4 (four) hours as needed for cramping.   isosorbide mononitrate 30 MG 24 hr tablet Commonly known as: IMDUR Take 1 tablet (30 mg total) by mouth daily. What changed:  when to take this reasons to take this   metaxalone 800 MG tablet Commonly known as: SKELAXIN Take 800 mg by mouth 3 (three) times daily as needed for muscle spasms.   metoprolol succinate 50 MG 24 hr tablet Commonly known as: TOPROL-XL TAKE 1 TABLET BY MOUTH EVERY DAY AFTER A MEAL What changed: See the new instructions.   nitroGLYCERIN 0.4 MG SL tablet Commonly known as: NITROSTAT Place 0.4 mg under the tongue every 5 (five) minutes as needed for chest pain.   pantoprazole 40 MG tablet Commonly known as: PROTONIX TAKE 1 TABLET BY MOUTH DAILY BEFORE BREAKFAST   PARoxetine 40 MG tablet Commonly known as: PAXIL Take 40 mg by mouth in the morning.   PROBIOTIC PO Take 1 capsule by mouth daily.   sodium chloride 0.65 % Soln nasal spray Commonly known as: OCEAN Place 1 spray into both nostrils as needed for congestion.   tamsulosin 0.4 MG Caps capsule Commonly known as: FLOMAX Take 0.4 mg by mouth 2 (two) times daily.        Follow-up Information     Lajuana Matte, MD Follow up on 04/23/2021.   Specialty: Cardiothoracic Surgery Why: Appt at 11:20 am. Contact information: [address] [place] [address] [phone]         Marshell Garfinkel, MD Follow up on 05/10/2021.   Specialty: Pulmonary Disease Why: Appt  at 11:00 am.  Please arrive by 10:45 for check in. Contact information: [place] [address] [phone]         Seward Carol, MD Follow up in 2 week(s).   Specialty: Internal Medicine Why: Hospital follow up Contact information: [address] [place] [address] [phone]         Belva Crome, MD .   Specialty: Cardiology Contact information: [phone] [address] [place] [address] [phone]                 Discharge Exam: Danley Danker Weights   04/19/21 1121  Weight: 93.2 kg   General exam: Awake, laying in bed, in nad Respiratory system: Normal respiratory effort, no wheezing Cardiovascular system: regular rate, s1, s2 Gastrointestinal system: Soft, nondistended, positive BS Central nervous system: CN2-12 grossly intact, strength intact Extremities: Perfused, no clubbing Skin: Normal skin turgor, no  notable skin lesions seen Psychiatry: Mood normal // no visual hallucinations   Condition at discharge: good  The results of significant diagnostics from this hospitalization (including imaging, microbiology, ancillary and laboratory) are listed below for reference.   Imaging Studies: DG Chest 1 View  Result Date: 04/13/2021 CLINICAL DATA:  Known right-sided pneumothorax EXAM: CHEST  1 VIEW COMPARISON:  CT of earlier today and plain film of 04/01/2021 FINDINGS: Patient rotated right. Mild cardiomegaly. Atherosclerosis in the transverse aorta. Large right-sided pneumothorax, relatively similar to the scout view from the CT of earlier today. Greater than 50% right-sided pleural air volume. No pleural fluid. Collapsed right lung. Mild interstitial lung disease involving the left lung base. Remote seventh posterior left rib fracture. IMPRESSION: Similar appearance of large right-sided pneumothorax since the CT of earlier today. Cardiomegaly without congestive failure. Electronically Signed   By: Abigail Miyamoto M.D.   On: 04/13/2021 17:36   DG Chest 2 View  Result Date: 04/16/2021 CLINICAL DATA:  Post pneumothorax. EXAM: CHEST - 2 VIEW COMPARISON:  Chest radiograph yesterday. Chest CT 04/13/2021. Subsequent chest radiograph also reviewed. FINDINGS: Recurrent right pneumothorax, at least moderate in size, most prominently affecting the apex. There is no mediastinal shift. Patient is post median sternotomy. Stable heart size. Increasing patchy opacity at the right lung base corresponding to ground-glass opacities on prior CT. There is also a small right pleural effusion. No new airspace opacity on the left. Stable osseous structures. IMPRESSION: 1. Recurrent right pneumothorax, at least moderate in size, most prominently affecting the apex. No mediastinal shift. Referring provider is aware, and subsequently a pigtail catheter has been placed. 2. Increasing patchy opacity at the right lung base may represent atelectasis related to bronchovascular crowding, superimposed on ground-glass opacity that was seen on prior CT. Small right pleural effusion. Electronically Signed   By: Keith Rake M.D.   On: 04/16/2021 13:32   DG Chest 2 View  Result Date: 04/15/2021 CLINICAL DATA:  Pleural effusion EXAM: CHEST - 2 VIEW COMPARISON:  Chest radiograph obtained earlier the same day FINDINGS: There is a new right apical chest tube. Median sternotomy wires are stable. The cardiomediastinal silhouette is grossly stable. The previously seen right pneumothorax has resolved following chest tube placement. There remains a small right pleural effusion with right basilar opacity. Aeration of the left lung is unchanged, with probable left basilar atelectasis and a small left pleural effusion. The bones are stable. IMPRESSION: 1. New right apical chest tube with resolved right pneumothorax. 2. Unchanged right larger than left pleural effusions with bibasilar opacities which may reflect atelectasis. Electronically Signed   By:  Valetta Mole M.D.   On: 04/15/2021 16:00   DG Chest 2 View  Result Date: 04/01/2021 CLINICAL DATA:  Shortness of breath.  Ex-smoker. EXAM: CHEST - 2 VIEW COMPARISON:  03/06/2021 FINDINGS: Interval dense airspace opacity in the right lower lung zone with obscuration of the right heart borders. No gross cardiac enlargement. Stable scarring at the left lateral lung base. Old, healed left rib fracture without significant change. Right shoulder fixation anchors. Cholecystectomy clips. Thoracic and upper lumbar spine degenerative changes. Stable post CABG changes. IMPRESSION: Interval dense pneumonia and/or postobstructive changes in the lower half of the right chest. These results will be called to the ordering clinician or representative by the Radiologist Assistant, and communication documented in the PACS or [employer]. Electronically Signed   By: Claudie Revering M.D.   On: 04/01/2021 15:52   CT Angio Chest W/Cm &/Or Wo  Cm  Result Date: 04/13/2021 CLINICAL DATA:  Short of breath. Lobectomy 4 non-small cell lung cancer. RIGHT upper lobectomy 03/02/2021. EXAM: CT ANGIOGRAPHY CHEST WITH CONTRAST TECHNIQUE: Multidetector CT imaging of the chest was performed using the standard protocol during bolus administration of intravenous contrast. Multiplanar CT image reconstructions and MIPs were obtained to evaluate the vascular anatomy. RADIATION DOSE REDUCTION: This exam was performed according to the departmental dose-optimization program which includes automated exposure control, adjustment of the mA and/or kV according to patient size and/or use of iterative reconstruction technique. CONTRAST:  181mL OMNIPAQUE IOHEXOL 350 MG/ML SOLN COMPARISON:  Chest radiograph 04/01/2021, CT chest 02/25/2021 FINDINGS: Cardiovascular: Post CABG. No pulmonary embolism. RIGHT upper lobectomy anatomy. Mediastinum/Nodes: No axillary or supraclavicular adenopathy. No mediastinal or hilar adenopathy. No pericardial fluid. Esophagus  normal. Lungs/Pleura: Large RIGHT anterior and upper pneumothorax. Pneumothorax volume is large occupying proximally 50% of the hemithorax volume. The RIGHT middle lobe and RIGHT lower lobe are collapsed. Architectural distortion within the collapsed RIGHT lobes. No mediastinal shift compared to CT 02/25/2021. Fibrotic change at the LEFT lung base. No LEFT pneumothorax. Upper Abdomen: Limited view of the liver, kidneys, pancreas are unremarkable. Normal adrenal glands. Musculoskeletal: No aggressive osseous lesion. Review of the MIP images confirms the above findings. IMPRESSION: 1. No acute pulmonary embolism. 2. Large volume RIGHT pneumothorax following RIGHT upper lobe lobectomy. Dense atelectasis of the RIGHT middle lobe and RIGHT lower lobe. No mediastinal shift. No pleural fluid. 3. No LEFT pneumothorax. Findings conveyed toPRAVEEN MANNAM on 04/13/2021 at15:52 by CT tech Adin Hector Electronically Signed   By: Suzy Bouchard M.D.   On: 04/13/2021 15:55   DG CHEST PORT 1 VIEW  Result Date: 04/21/2021 CLINICAL DATA:  History of pigtail drainage catheter. EXAM: PORTABLE CHEST 1 VIEW COMPARISON:  Chest radiograph 04/20/2021. FINDINGS: Right chest tube in place. Monitoring leads overlie the patient. Stable cardiac and mediastinal contour status post median sternotomy. Persistent heterogeneous opacities throughout the right lung and left lung base. Subcutaneous emphysema overlying the right chest wall. No definite right-sided pneumothorax. Persistent small right pleural effusion IMPRESSION: Stable position right chest tube. Persistent small right pleural effusion and opacities within the right lung and left lung base. This is likely superimposed upon chronic fibrosis. Electronically Signed   By: Lovey Newcomer M.D.   On: 04/21/2021 08:15   DG CHEST PORT 1 VIEW  Result Date: 04/20/2021 CLINICAL DATA:  71 year old male with history of pneumothorax. EXAM: PORTABLE CHEST 1 VIEW COMPARISON:  Chest x-ray  04/19/2021. FINDINGS: Small bore right-sided chest tube again noted, with tip projecting over the mid right hemithorax. Previously noted right-sided pneumothorax is no longer apparent. Small right pleural effusion persists. Bibasilar opacities (right greater than left) which may reflect areas of atelectasis and/or consolidation. Vascular crowding, without frank pulmonary edema, likely accentuated by low lung volumes. Suture line in the right mid lung, presumably from prior wedge resections. No left pneumothorax. Heart size is normal. Upper mediastinal contours are distorted by patient positioning. Subcutaneous emphysema in the right axillary region and tracking along the right chest wall. IMPRESSION: 1. Support apparatus and postoperative changes, as above. 2. Resolution of previously noted right-sided pneumothorax. Persistent small right pleural effusion. 3. Persistent bibasilar opacities which may reflect areas of atelectasis and/or consolidation, in addition to areas of probable chronic fibrosis (see prior chest CT 04/13/2021). Electronically Signed   By: Vinnie Langton M.D.   On: 04/20/2021 05:07   DG Chest Port 1 View  Result Date: 04/19/2021 CLINICAL DATA:  Shortness  of breath. Right chest tube present for pneumothorax. EXAM: PORTABLE CHEST 1 VIEW COMPARISON:  April 15, 2021 FINDINGS: The heart size and mediastinal contours are stable. Right chest tube is identified. There is interval increase of moderate right pneumothorax extending to the level of right fifth rib. Patchy consolidation of the right lung base is noted. Mild patchy consolidation of left lung base is improved compared prior exam. The visualized skeletal structures are stable. IMPRESSION: 1. Right chest tube is identified. There is interval increase of moderate right pneumothorax extending to the level of right fifth rib. 2. Patchy consolidation of the right lung base. Mild patchy consolidation of left lung base is improved compared  prior exam. These results will be called to the ordering clinician or representative by the Radiologist Assistant, and communication documented in the PACS or [employer]. Electronically Signed   By: Abelardo Diesel M.D.   On: 04/19/2021 11:38   Portable chest 1 View  Result Date: 04/14/2021 CLINICAL DATA:  Pneumothorax. EXAM: PORTABLE CHEST 1 VIEW COMPARISON:  04/13/2021. FINDINGS: The heart is obscured and mediastinal contours are stable. There is evidence of prior cardiothoracic surgery. Patchy airspace disease is present at the lung bases bilaterally and there are small bilateral pleural effusions. Surgical changes are noted in the mid right lung. No pneumothorax is seen. No acute osseous abnormality. IMPRESSION: 1. The previously described pneumothorax is no longer seen. 2. Patchy atelectasis or infiltrate at the lung bases with small bilateral pleural effusions. Electronically Signed   By: Brett Fairy M.D.   On: 04/14/2021 05:00   DG CHEST PORT 1 VIEW  Result Date: 04/13/2021 CLINICAL DATA:  Provided history: Pneumothorax. Additional history provided: Post thoracentesis. EXAM: PORTABLE CHEST 1 VIEW COMPARISON:  Prior chest radiographs 04/13/2021 and earlier. Chest CT 04/13/2021. FINDINGS: Prior median sternotomy/CABG. The right aspect of the cardiac silhouette is largely obscured, limiting evaluation of heart size. Prior right upper lobectomy. A previously demonstrated large right pneumothorax has near completely resolved. Only a trace residual right pneumothorax is questioned. Known basilar predominant fibrotic lung disease. Asymmetric opacity at the right lung base, which may reflect atelectasis and/or pneumonia. No definite pleural effusion. IMPRESSION: A previously demonstrated large right pneumothorax has near completely resolved. Only a trace residual right pneumothorax is questioned. Asymmetric opacity at the right lung base, which may reflect atelectasis and/or pneumonia. Prior right upper  lobectomy. Known basilar-predominant fibrotic lung disease. Electronically Signed   By: Kellie Simmering D.O.   On: 04/13/2021 18:21    Microbiology: Results for orders placed or performed during the hospital encounter of 04/19/21  Resp Panel by RT-PCR (Flu A&B, Covid) Nasopharyngeal Swab     Status: None   Collection Time: 04/19/21  2:38 PM   Specimen: Nasopharyngeal Swab; Nasopharyngeal(NP) swabs in vial transport medium  Result Value Ref Range Status   SARS Coronavirus 2 by RT PCR NEGATIVE NEGATIVE Final    Comment: (NOTE) SARS-CoV-2 target nucleic acids are NOT DETECTED.  The SARS-CoV-2 RNA is generally detectable in upper respiratory specimens during the acute phase of infection. The lowest concentration of SARS-CoV-2 viral copies this assay can detect is 138 copies/mL. A negative result does not preclude SARS-Cov-2 infection and should not be used as the sole basis for treatment or other patient management decisions. A negative result may occur with  improper specimen collection/handling, submission of specimen other than nasopharyngeal swab, presence of viral mutation(s) within the areas targeted by this assay, and inadequate number of viral copies(<138 copies/mL). A negative result must  be combined with clinical observations, patient history, and epidemiological information. The expected result is Negative.  Fact Sheet for Patients:  EntrepreneurPulse.com.au  Fact Sheet for Healthcare Providers:  IncredibleEmployment.be  This test is no t yet approved or cleared by the [place] FDA and  has been authorized for detection and/or diagnosis of SARS-CoV-2 by FDA under an Emergency Use Authorization (EUA). This EUA will remain  in effect (meaning this test can be used) for the duration of the COVID-19 declaration under Section 564(b)(1) of the Act, 21 U.S.C.section 360bbb-3(b)(1), unless the authorization is terminated  or revoked sooner.        Influenza A by PCR NEGATIVE NEGATIVE Final   Influenza B by PCR NEGATIVE NEGATIVE Final    Comment: (NOTE) The Xpert Xpress SARS-CoV-2/FLU/RSV plus assay is intended as an aid in the diagnosis of influenza from Nasopharyngeal swab specimens and should not be used as a sole basis for treatment. Nasal washings and aspirates are unacceptable for Xpert Xpress SARS-CoV-2/FLU/RSV testing.  Fact Sheet for Patients: EntrepreneurPulse.com.au  Fact Sheet for Healthcare Providers: IncredibleEmployment.be  This test is not yet approved or cleared by the [place] FDA and has been authorized for detection and/or diagnosis of SARS-CoV-2 by FDA under an Emergency Use Authorization (EUA). This EUA will remain in effect (meaning this test can be used) for the duration of the COVID-19 declaration under Section 564(b)(1) of the Act, 21 U.S.C. section 360bbb-3(b)(1), unless the authorization is terminated or revoked.  Performed at [facility], [place] [address], [place], [place] [address]     Labs: CBC: Recent Labs  Lab 04/19/21 1146 04/20/21 0517 04/21/21 0507  WBC 13.6* 11.1* 11.1*  NEUTROABS 10.6*  --   --   HGB 15.6 15.1 14.7  HCT 46.2 44.4 44.1  MCV 94.9 94.5 96.1  PLT 296 296 245   Basic Metabolic Panel: Recent Labs  Lab 04/19/21 1146 04/20/21 0517 04/21/21 0507  NA 134* 134* 135  K 4.3 4.2 4.6  CL 100 100 102  CO2 28 28 28   GLUCOSE 110* 108* 114*  BUN 15 16 15   CREATININE 0.85 0.69 0.77  CALCIUM 8.7* 8.5* 8.7*   Liver Function Tests: Recent Labs  Lab 04/20/21 0517  AST 21  ALT 20  ALKPHOS 69  BILITOT 0.5  PROT 6.0*  ALBUMIN 3.1*   CBG: No results for input(s): GLUCAP in the last 168 hours.  Discharge time spent: less than 30 minutes.  Signed: Marylu Lund, MD [facility] 04/21/2021

## 2021-04-22 ENCOUNTER — Other Ambulatory Visit: Payer: Self-pay | Admitting: Thoracic Surgery (Cardiothoracic Vascular Surgery)

## 2021-04-22 DIAGNOSIS — J939 Pneumothorax, unspecified: Secondary | ICD-10-CM

## 2021-04-23 ENCOUNTER — Other Ambulatory Visit: Payer: Self-pay

## 2021-04-23 ENCOUNTER — Ambulatory Visit (INDEPENDENT_AMBULATORY_CARE_PROVIDER_SITE_OTHER): Payer: Self-pay | Admitting: Thoracic Surgery (Cardiothoracic Vascular Surgery)

## 2021-04-23 ENCOUNTER — Ambulatory Visit
Admission: RE | Admit: 2021-04-23 | Discharge: 2021-04-23 | Disposition: A | Discharge status: Home / Self Care | Payer: PPO | Source: Ambulatory Visit | Attending: Thoracic Surgery (Cardiothoracic Vascular Surgery) | Admitting: Thoracic Surgery (Cardiothoracic Vascular Surgery)

## 2021-04-23 VITALS — BP 101/68 | HR 80 | Resp 20 | Ht 72.0 in | Wt 205.0 lb

## 2021-04-23 DIAGNOSIS — J939 Pneumothorax, unspecified: Secondary | ICD-10-CM

## 2021-04-23 DIAGNOSIS — C3411 Malignant neoplasm of upper lobe, right bronchus or lung: Secondary | ICD-10-CM

## 2021-04-23 DIAGNOSIS — Z902 Acquired absence of lung [part of]: Secondary | ICD-10-CM

## 2021-04-23 NOTE — Progress Notes (Signed)
° °   °  [address]       [place] [address]             [phone]        Nicklaus P Coreas Spencer Medical Record #[id number] Date of Birth: 16-Mar-1950  Referring: Marshell Garfinkel, MD Primary Care: Seward Carol, MD Primary Cardiologist:Henry Carlye Grippe, MD  Reason for visit:   follow-up  History of Present Illness:     71 year old male comes in for his 1 month follow-up appointment.  Since his last visit he has developed recurrent pneumothorax which is being treated with pigtail catheter placement.  While in place, developed another recurrent pneumothorax likely due to appropriate drainage.  Over the last 12 hours, he denies any shortness of breath but has had over 200 mL of serous output.  Physical Exam: BP 101/68    Pulse 80    Resp 20    Ht 6' (1.829 m)    Wt 205 lb (93 kg)    SpO2 95% Comment: RA   BMI 27.80 kg/m   Alert NAD Incision clean.  Serous chest tube output.  No obvious leak.  The catheter was however kinked as it was tucked into his pants. Abdomen, ND No peripheral edema   Diagnostic Studies & Laboratory data: CXR: Small effusion.  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  I will see him back in 1 week to see if we can get the chest tube out.   Lajuana Matte 04/23/2021 1:48 PM

## 2021-04-24 ENCOUNTER — Encounter: Payer: Self-pay | Admitting: Pulmonary Disease

## 2021-04-25 ENCOUNTER — Other Ambulatory Visit: Payer: Self-pay

## 2021-04-25 ENCOUNTER — Emergency Department (HOSPITAL_COMMUNITY): Payer: PPO

## 2021-04-25 ENCOUNTER — Telehealth: Payer: Self-pay | Admitting: Cardiothoracic Surgery

## 2021-04-25 ENCOUNTER — Inpatient Hospital Stay (HOSPITAL_COMMUNITY)
Admission: EM | Admit: 2021-04-25 | Discharge: 2021-04-27 | DRG: 698 | Disposition: A | Discharge status: Home / Self Care | Payer: PPO | Attending: Cardiothoracic Surgery | Admitting: Cardiothoracic Surgery

## 2021-04-25 DIAGNOSIS — Z9689 Presence of other specified functional implants: Secondary | ICD-10-CM

## 2021-04-25 DIAGNOSIS — E785 Hyperlipidemia, unspecified: Secondary | ICD-10-CM | POA: Diagnosis present

## 2021-04-25 DIAGNOSIS — K219 Gastro-esophageal reflux disease without esophagitis: Secondary | ICD-10-CM | POA: Diagnosis present

## 2021-04-25 DIAGNOSIS — J440 Chronic obstructive pulmonary disease with acute lower respiratory infection: Secondary | ICD-10-CM | POA: Diagnosis present

## 2021-04-25 DIAGNOSIS — I10 Essential (primary) hypertension: Secondary | ICD-10-CM | POA: Diagnosis present

## 2021-04-25 DIAGNOSIS — M47816 Spondylosis without myelopathy or radiculopathy, lumbar region: Secondary | ICD-10-CM | POA: Diagnosis present

## 2021-04-25 DIAGNOSIS — T83018A Breakdown (mechanical) of other indwelling urethral catheter, initial encounter: Secondary | ICD-10-CM | POA: Diagnosis not present

## 2021-04-25 DIAGNOSIS — F419 Anxiety disorder, unspecified: Secondary | ICD-10-CM | POA: Diagnosis present

## 2021-04-25 DIAGNOSIS — Z79899 Other long term (current) drug therapy: Secondary | ICD-10-CM

## 2021-04-25 DIAGNOSIS — I2510 Atherosclerotic heart disease of native coronary artery without angina pectoris: Secondary | ICD-10-CM | POA: Diagnosis present

## 2021-04-25 DIAGNOSIS — G8929 Other chronic pain: Secondary | ICD-10-CM | POA: Diagnosis present

## 2021-04-25 DIAGNOSIS — J90 Pleural effusion, not elsewhere classified: Principal | ICD-10-CM | POA: Diagnosis present

## 2021-04-25 DIAGNOSIS — G4733 Obstructive sleep apnea (adult) (pediatric): Secondary | ICD-10-CM | POA: Diagnosis present

## 2021-04-25 DIAGNOSIS — Z96643 Presence of artificial hip joint, bilateral: Secondary | ICD-10-CM | POA: Diagnosis present

## 2021-04-25 DIAGNOSIS — Z955 Presence of coronary angioplasty implant and graft: Secondary | ICD-10-CM | POA: Diagnosis not present

## 2021-04-25 DIAGNOSIS — C3411 Malignant neoplasm of upper lobe, right bronchus or lung: Secondary | ICD-10-CM | POA: Diagnosis present

## 2021-04-25 DIAGNOSIS — Z888 Allergy status to other drugs, medicaments and biological substances status: Secondary | ICD-10-CM | POA: Diagnosis not present

## 2021-04-25 DIAGNOSIS — M545 Low back pain, unspecified: Secondary | ICD-10-CM | POA: Diagnosis present

## 2021-04-25 DIAGNOSIS — I252 Old myocardial infarction: Secondary | ICD-10-CM

## 2021-04-25 DIAGNOSIS — C3490 Malignant neoplasm of unspecified part of unspecified bronchus or lung: Secondary | ICD-10-CM

## 2021-04-25 DIAGNOSIS — Z8249 Family history of ischemic heart disease and other diseases of the circulatory system: Secondary | ICD-10-CM

## 2021-04-25 DIAGNOSIS — Z87891 Personal history of nicotine dependence: Secondary | ICD-10-CM

## 2021-04-25 DIAGNOSIS — Z7902 Long term (current) use of antithrombotics/antiplatelets: Secondary | ICD-10-CM | POA: Diagnosis not present

## 2021-04-25 DIAGNOSIS — Y838 Other surgical procedures as the cause of abnormal reaction of the patient, or of later complication, without mention of misadventure at the time of the procedure: Secondary | ICD-10-CM | POA: Diagnosis present

## 2021-04-25 DIAGNOSIS — Z951 Presence of aortocoronary bypass graft: Secondary | ICD-10-CM

## 2021-04-25 DIAGNOSIS — Z20822 Contact with and (suspected) exposure to covid-19: Secondary | ICD-10-CM | POA: Diagnosis present

## 2021-04-25 DIAGNOSIS — I459 Conduction disorder, unspecified: Secondary | ICD-10-CM | POA: Diagnosis not present

## 2021-04-25 DIAGNOSIS — J8410 Pulmonary fibrosis, unspecified: Secondary | ICD-10-CM | POA: Diagnosis not present

## 2021-04-25 DIAGNOSIS — R0602 Shortness of breath: Secondary | ICD-10-CM

## 2021-04-25 DIAGNOSIS — I739 Peripheral vascular disease, unspecified: Secondary | ICD-10-CM | POA: Diagnosis present

## 2021-04-25 DIAGNOSIS — N400 Enlarged prostate without lower urinary tract symptoms: Secondary | ICD-10-CM | POA: Diagnosis present

## 2021-04-25 DIAGNOSIS — I9789 Other postprocedural complications and disorders of the circulatory system, not elsewhere classified: Secondary | ICD-10-CM

## 2021-04-25 DIAGNOSIS — Z809 Family history of malignant neoplasm, unspecified: Secondary | ICD-10-CM

## 2021-04-25 DIAGNOSIS — J189 Pneumonia, unspecified organism: Secondary | ICD-10-CM | POA: Diagnosis present

## 2021-04-25 LAB — CBC WITH DIFFERENTIAL/PLATELET
Abs Immature Granulocytes: 0.06 10*3/uL (ref 0.00–0.07)
Basophils Absolute: 0.1 10*3/uL (ref 0.0–0.1)
Basophils Relative: 1 %
Eosinophils Absolute: 0.6 10*3/uL — ABNORMAL HIGH (ref 0.0–0.5)
Eosinophils Relative: 6 %
HCT: 40.9 % (ref 39.0–52.0)
Hemoglobin: 14.4 g/dL (ref 13.0–17.0)
Immature Granulocytes: 1 %
Lymphocytes Relative: 13 %
Lymphs Abs: 1.1 10*3/uL (ref 0.7–4.0)
MCH: 32.8 pg (ref 26.0–34.0)
MCHC: 35.2 g/dL (ref 30.0–36.0)
MCV: 93.2 fL (ref 80.0–100.0)
Monocytes Absolute: 0.7 10*3/uL (ref 0.1–1.0)
Monocytes Relative: 8 %
Neutro Abs: 6.2 10*3/uL (ref 1.7–7.7)
Neutrophils Relative %: 71 %
Platelets: 305 10*3/uL (ref 150–400)
RBC: 4.39 MIL/uL (ref 4.22–5.81)
RDW: 12.7 % (ref 11.5–15.5)
WBC: 8.6 10*3/uL (ref 4.0–10.5)
nRBC: 0 % (ref 0.0–0.2)

## 2021-04-25 LAB — RESP PANEL BY RT-PCR (FLU A&B, COVID) ARPGX2
Influenza A by PCR: NEGATIVE
Influenza B by PCR: NEGATIVE
SARS Coronavirus 2 by RT PCR: NEGATIVE

## 2021-04-25 LAB — SURGICAL PCR SCREEN
MRSA, PCR: NEGATIVE
Staphylococcus aureus: NEGATIVE

## 2021-04-25 LAB — BASIC METABOLIC PANEL WITH GFR
Anion gap: 9 (ref 5–15)
BUN: 11 mg/dL (ref 8–23)
CO2: 27 mmol/L (ref 22–32)
Calcium: 8.8 mg/dL — ABNORMAL LOW (ref 8.9–10.3)
Chloride: 100 mmol/L (ref 98–111)
Creatinine, Ser: 1.01 mg/dL (ref 0.61–1.24)
GFR, Estimated: >60 mL/min
Glucose, Bld: 133 mg/dL — ABNORMAL HIGH (ref 70–99)
Potassium: 3.8 mmol/L (ref 3.5–5.1)
Sodium: 136 mmol/L (ref 135–145)

## 2021-04-25 LAB — CBC
HCT: 40.3 % (ref 39.0–52.0)
Hemoglobin: 14 g/dL (ref 13.0–17.0)
MCH: 32.2 pg (ref 26.0–34.0)
MCHC: 34.7 g/dL (ref 30.0–36.0)
MCV: 92.6 fL (ref 80.0–100.0)
Platelets: 294 10*3/uL (ref 150–400)
RBC: 4.35 MIL/uL (ref 4.22–5.81)
RDW: 12.6 % (ref 11.5–15.5)
WBC: 8.5 10*3/uL (ref 4.0–10.5)
nRBC: 0 % (ref 0.0–0.2)

## 2021-04-25 LAB — LACTIC ACID, PLASMA
Lactic Acid, Venous: 1.6 mmol/L (ref 0.5–1.9)
Lactic Acid, Venous: 2 mmol/L (ref 0.5–1.9)

## 2021-04-25 LAB — CREATININE, SERUM
Creatinine, Ser: 0.96 mg/dL (ref 0.61–1.24)
GFR, Estimated: >60 mL/min (ref >60)

## 2021-04-25 MED ORDER — LEVALBUTEROL HCL 0.63 MG/3ML IN NEBU
0.6300 mg | INHALATION_SOLUTION | Freq: Four times a day (QID) | RESPIRATORY_TRACT | Status: DC
  Administered 2021-04-25 – 2021-04-26 (×4): 0.63 mg via RESPIRATORY_TRACT
  Filled 2021-04-25 (×6): qty 3

## 2021-04-25 MED ORDER — METAXALONE 800 MG PO TABS
800.0000 mg | ORAL_TABLET | Freq: Three times a day (TID) | ORAL | Status: DC
  Administered 2021-04-26 – 2021-04-27 (×4): 800 mg via ORAL
  Filled 2021-04-25 (×4): qty 1

## 2021-04-25 MED ORDER — GABAPENTIN 100 MG PO CAPS
100.0000 mg | ORAL_CAPSULE | Freq: Two times a day (BID) | ORAL | Status: DC
Start: 2021-04-25 — End: 2021-04-27
  Administered 2021-04-25 – 2021-04-27 (×4): 100 mg via ORAL
  Filled 2021-04-25 (×4): qty 1

## 2021-04-25 MED ORDER — METOPROLOL SUCCINATE ER 50 MG PO TB24
50.0000 mg | ORAL_TABLET | Freq: Every day | ORAL | Status: DC
  Administered 2021-04-26 – 2021-04-27 (×2): 50 mg via ORAL
  Filled 2021-04-25 (×2): qty 1

## 2021-04-25 MED ORDER — METHYLPREDNISOLONE SODIUM SUCC 40 MG IJ SOLR
40.0000 mg | Freq: Two times a day (BID) | INTRAMUSCULAR | Status: AC
  Administered 2021-04-25 – 2021-04-27 (×4): 40 mg via INTRAVENOUS
  Filled 2021-04-25 (×4): qty 1

## 2021-04-25 MED ORDER — KCL IN DEXTROSE-NACL 10-5-0.45 MEQ/L-%-% IV SOLN
INTRAVENOUS | Status: AC
  Filled 2021-04-25: qty 1000

## 2021-04-25 MED ORDER — ASPIRIN EC 81 MG PO TBEC
81.0000 mg | DELAYED_RELEASE_TABLET | Freq: Every day | ORAL | Status: DC
  Administered 2021-04-25 – 2021-04-27 (×3): 81 mg via ORAL
  Filled 2021-04-25 (×3): qty 1

## 2021-04-25 MED ORDER — SORBITOL 70 % SOLN
30.0000 mL | Freq: Every day | Status: DC | PRN
  Filled 2021-04-25: qty 30

## 2021-04-25 MED ORDER — ADULT MULTIVITAMIN W/MINERALS CH
1.0000 | ORAL_TABLET | Freq: Every day | ORAL | Status: DC
  Administered 2021-04-25 – 2021-04-27 (×3): 1 via ORAL
  Filled 2021-04-25 (×3): qty 1

## 2021-04-25 MED ORDER — PAROXETINE HCL 20 MG PO TABS
40.0000 mg | ORAL_TABLET | Freq: Every day | ORAL | Status: DC
Start: 2021-04-26 — End: 2021-04-27
  Administered 2021-04-26 – 2021-04-27 (×2): 40 mg via ORAL
  Filled 2021-04-25 (×2): qty 2

## 2021-04-25 MED ORDER — ATORVASTATIN CALCIUM 80 MG PO TABS
80.0000 mg | ORAL_TABLET | Freq: Every day | ORAL | Status: DC
Start: 2021-04-26 — End: 2021-04-27
  Administered 2021-04-26 – 2021-04-27 (×2): 80 mg via ORAL
  Filled 2021-04-25 (×2): qty 1

## 2021-04-25 MED ORDER — CEFTRIAXONE SODIUM 2 G IJ SOLR
2.0000 g | INTRAMUSCULAR | Status: DC
  Administered 2021-04-25 – 2021-04-26 (×2): 2 g via INTRAVENOUS
  Filled 2021-04-25 (×2): qty 20

## 2021-04-25 MED ORDER — GUAIFENESIN ER 600 MG PO TB12
600.0000 mg | ORAL_TABLET | Freq: Two times a day (BID) | ORAL | Status: DC
  Administered 2021-04-25 – 2021-04-27 (×4): 600 mg via ORAL
  Filled 2021-04-25 (×4): qty 1

## 2021-04-25 MED ORDER — CLOPIDOGREL BISULFATE 75 MG PO TABS
75.0000 mg | ORAL_TABLET | Freq: Every day | ORAL | Status: DC
  Administered 2021-04-26 – 2021-04-27 (×2): 75 mg via ORAL
  Filled 2021-04-25 (×2): qty 1

## 2021-04-25 MED ORDER — SODIUM CHLORIDE 0.9% FLUSH
3.0000 mL | Freq: Two times a day (BID) | INTRAVENOUS | Status: DC
  Administered 2021-04-25: 3 mL via INTRAVENOUS

## 2021-04-25 MED ORDER — PANTOPRAZOLE SODIUM 40 MG PO TBEC
40.0000 mg | DELAYED_RELEASE_TABLET | Freq: Every day | ORAL | Status: DC
  Administered 2021-04-26 – 2021-04-27 (×2): 40 mg via ORAL
  Filled 2021-04-25 (×2): qty 1

## 2021-04-25 MED ORDER — ISOSORBIDE MONONITRATE ER 30 MG PO TB24
30.0000 mg | ORAL_TABLET | Freq: Every day | ORAL | Status: DC
  Administered 2021-04-26 – 2021-04-27 (×2): 30 mg via ORAL
  Filled 2021-04-25 (×2): qty 1

## 2021-04-25 MED ORDER — ALPRAZOLAM 0.5 MG PO TABS
0.5000 mg | ORAL_TABLET | Freq: Three times a day (TID) | ORAL | Status: DC | PRN
  Administered 2021-04-25 – 2021-04-26 (×3): 0.5 mg via ORAL
  Filled 2021-04-25: qty 1
  Filled 2021-04-25: qty 2
  Filled 2021-04-25: qty 1

## 2021-04-25 MED ORDER — DOCUSATE SODIUM 100 MG PO CAPS
100.0000 mg | ORAL_CAPSULE | Freq: Two times a day (BID) | ORAL | Status: DC
  Administered 2021-04-25 – 2021-04-27 (×4): 100 mg via ORAL
  Filled 2021-04-25 (×4): qty 1

## 2021-04-25 MED ORDER — ENOXAPARIN SODIUM 40 MG/0.4ML IJ SOSY
40.0000 mg | PREFILLED_SYRINGE | INTRAMUSCULAR | Status: DC
  Administered 2021-04-25: 40 mg via SUBCUTANEOUS
  Filled 2021-04-25: qty 0.4

## 2021-04-25 MED ORDER — METHOCARBAMOL 1000 MG/10ML IJ SOLN
500.0000 mg | Freq: Four times a day (QID) | INTRAVENOUS | Status: DC | PRN
  Filled 2021-04-25: qty 5

## 2021-04-25 MED ORDER — HYDROCODONE-ACETAMINOPHEN 5-325 MG PO TABS
2.0000 | ORAL_TABLET | Freq: Four times a day (QID) | ORAL | Status: DC | PRN
  Administered 2021-04-25 – 2021-04-27 (×6): 2 via ORAL
  Filled 2021-04-25 (×6): qty 2

## 2021-04-25 MED ORDER — BUDESONIDE 3 MG PO CPEP
3.0000 mg | ORAL_CAPSULE | Freq: Every day | ORAL | Status: DC
  Administered 2021-04-26 – 2021-04-27 (×2): 3 mg via ORAL
  Filled 2021-04-25 (×2): qty 1

## 2021-04-25 MED ORDER — TAMSULOSIN HCL 0.4 MG PO CAPS
0.4000 mg | ORAL_CAPSULE | Freq: Every day | ORAL | Status: DC
  Administered 2021-04-25 – 2021-04-27 (×3): 0.4 mg via ORAL
  Filled 2021-04-25 (×3): qty 1

## 2021-04-25 NOTE — ED Provider Notes (Signed)
[facility] EMERGENCY DEPARTMENT Provider Note   CSN: [id number] Arrival date & time: 04/25/21  0947     History  Chief Complaint  Patient presents with   Post-op Problem    Chest tube    DARIS HARKINS is a 71 y.o. male.  He has a history of lung cancer and had a partial lobectomy.  Complicated by requiring a pigtail catheter for pneumothorax.  Currently still has a pigtail catheter in.  Today when he woke up he was experiencing some right scapular pain and increased shortness of breath.  Catheter appears to be functioning correctly.  No fevers or chills.  No nausea or vomiting.  He does have a cough productive of some white sputum.  Wife said the tube looks like it had been kinked overnight.  The history is provided by the patient and the spouse.  Shortness of Breath Severity:  Moderate Onset quality:  Gradual Duration:  1 day Timing:  Constant Progression:  Unchanged Chronicity:  Recurrent Relieved by:  Nothing Worsened by:  Activity and coughing Ineffective treatments:  Rest Associated symptoms: cough and sputum production   Associated symptoms: no abdominal pain, no fever, no headaches, no hemoptysis, no rash, no sore throat and no vomiting   Risk factors: recent surgery       Home Medications Prior to Admission medications   Medication Sig Start Date End Date Taking? Authorizing Provider  acetaminophen (TYLENOL) 500 MG tablet Take 500 mg by mouth every 6 (six) hours as needed for moderate pain.    [provider]  ALPRAZolam Duanne Moron) 0.5 MG tablet Take 0.25-0.5 mg by mouth 3 (three) times daily as needed for anxiety or sleep. 01/31/21   [provider]  antiseptic oral rinse (BIOTENE) LIQD 15 mLs by Mouth Rinse route as needed for dry mouth.    [provider]  atorvastatin (LIPITOR) 80 MG tablet TAKE 1 TABLET BY MOUTH EVERY DAY Patient taking differently: Take 80 mg by mouth daily. 02/11/21   Belva Crome, MD  budesonide  (ENTOCORT EC) 3 MG 24 hr capsule Take 3-6 mg by mouth See admin instructions. Takes 2 capsules (6 mg) by mouth in the morning and take 1 capsule (3 mg) by mouth at night    [provider]  cholecalciferol (VITAMIN D3) 25 MCG (1000 UNIT) tablet Take 1,000 Units by mouth in the morning.    [provider]  clopidogrel (PLAVIX) 75 MG tablet TAKE 1 TABLET BY MOUTH EVERY DAY Patient taking differently: Take 75 mg by mouth daily. 08/27/20   Waynetta Sandy, MD  gabapentin (NEURONTIN) 100 MG capsule Take 2 capsules (200 mg total) by mouth 3 (three) times daily. 03/07/21   Nani Skillern, PA-C  HYDROcodone-acetaminophen (NORCO) 10-325 MG tablet Take 1 tablet by mouth every 4 (four) hours as needed for moderate pain or severe pain. 04/10/21   [provider]  hyoscyamine (LEVSIN, ANASPAZ) 0.125 MG tablet Take 0.125 mg by mouth every 4 (four) hours as needed for cramping.     [provider]  isosorbide mononitrate (IMDUR) 30 MG 24 hr tablet Take 1 tablet (30 mg total) by mouth daily. Patient taking differently: Take 30 mg by mouth daily as needed (chest pain). 10/05/20 10/05/21  [name], PA  Lidocaine 4 % PTCH Apply 1 patch topically as needed (pain).    [provider]  metaxalone (SKELAXIN) 800 MG tablet Take 800 mg by mouth 3 (three) times daily as needed for muscle  spasms.    [provider]  metoprolol succinate (TOPROL-XL) 50 MG 24 hr tablet TAKE 1 TABLET BY MOUTH EVERY DAY AFTER A MEAL Patient taking differently: 50 mg daily. 02/15/21   Belva Crome, MD  nitroGLYCERIN (NITROSTAT) 0.4 MG SL tablet Place 0.4 mg under the tongue every 5 (five) minutes as needed for chest pain.    [provider]  pantoprazole (PROTONIX) 40 MG tablet TAKE 1 TABLET BY MOUTH DAILY BEFORE BREAKFAST Patient taking differently: Take 40 mg by mouth daily before breakfast. 01/01/21   Belva Crome, MD  PARoxetine (PAXIL) 40 MG tablet Take 40 mg  by mouth in the morning. 01/05/15   [provider]  Probiotic Product (PROBIOTIC PO) Take 1 capsule by mouth daily.    [provider]  sodium chloride (OCEAN) 0.65 % SOLN nasal spray Place 1 spray into both nostrils as needed for congestion.    [provider]  tamsulosin (FLOMAX) 0.4 MG CAPS capsule Take 0.4 mg by mouth 2 (two) times daily. 08/18/14   [provider]      Allergies    Ambien [zolpidem tartrate]    Review of Systems   Review of Systems  Constitutional:  Negative for fever.  HENT:  Negative for sore throat.   Eyes:  Negative for visual disturbance.  Respiratory:  Positive for cough, sputum production and shortness of breath. Negative for hemoptysis.   Gastrointestinal:  Negative for abdominal pain and vomiting.  Skin:  Negative for rash.  Neurological:  Negative for headaches.   Physical Exam Updated Vital Signs BP (!) 121/93    Pulse (!) 110    Temp 97.7 F (36.5 C) (Oral)    Resp 20    SpO2 90%  Physical Exam Vitals and nursing note reviewed.  Constitutional:      General: He is not in acute distress.    Appearance: Normal appearance. He is well-developed.  HENT:     Head: Normocephalic and atraumatic.  Eyes:     Conjunctiva/sclera: Conjunctivae normal.  Cardiovascular:     Rate and Rhythm: Regular rhythm. Tachycardia present.     Heart sounds: No murmur heard. Pulmonary:     Effort: Pulmonary effort is normal. No respiratory distress.     Breath sounds: Normal breath sounds.     Comments: He has a pigtail catheter in his right upper chest.  He also has a healing surgical incision in his right lateral lower chest. Abdominal:     Palpations: Abdomen is soft.     Tenderness: There is no abdominal tenderness. There is no guarding or rebound.  Musculoskeletal:        General: No swelling.     Cervical back: Neck supple.     Right lower leg: No edema.     Left lower leg: No edema.  Skin:    General: Skin is warm and dry.      Capillary Refill: Capillary refill takes less than 2 seconds.  Neurological:     General: No focal deficit present.     Mental Status: He is alert.    ED Results / Procedures / Treatments   Labs (all labs ordered are listed, but only abnormal results are displayed) Labs Reviewed  RESPIRATORY PANEL BY PCR - Abnormal; Notable for the following components:      Result Value   Metapneumovirus DETECTED (*)    All other components within normal limits  BASIC METABOLIC PANEL - Abnormal; Notable for the following components:  Glucose, Bld 133 (*)    Calcium 8.8 (*)    All other components within normal limits  CBC WITH DIFFERENTIAL/PLATELET - Abnormal; Notable for the following components:   Eosinophils Absolute 0.6 (*)    All other components within normal limits  LACTIC ACID, PLASMA - Abnormal; Notable for the following components:   Lactic Acid, Venous 2.0 (*)    All other components within normal limits  COMPREHENSIVE METABOLIC PANEL - Abnormal; Notable for the following components:   Glucose, Bld 231 (*)    Total Protein 6.1 (*)    Albumin 2.6 (*)    All other components within normal limits  RESP PANEL BY RT-PCR (FLU A&B, COVID) ARPGX2  CULTURE, BLOOD (ROUTINE X 2)  CULTURE, BLOOD (ROUTINE X 2)  EXPECTORATED SPUTUM ASSESSMENT W GRAM STAIN, RFLX TO RESP C  SURGICAL PCR SCREEN  CULTURE, RESPIRATORY W GRAM STAIN  EXPECTORATED SPUTUM ASSESSMENT W GRAM STAIN, RFLX TO RESP C  LACTIC ACID, PLASMA  CBC  CREATININE, SERUM  CBC  PROTIME-INR    EKG None  Radiology DG Chest Port 1 View  Result Date: 04/26/2021 CLINICAL DATA:  Right pleural effusion EXAM: PORTABLE CHEST 1 VIEW COMPARISON:  Previous studies including the examination of 04/25/2021 FINDINGS: There is decreased volume in right lung. Infiltrates are seen in the right lung, more so in the lower lung fields. Increased markings are seen in the left lower lung fields. There is interval removal of right chest tube.  There is blunting of right lateral CP angle. There is no demonstrable pneumothorax. IMPRESSION: Interval removal of right chest tube. There is no pneumothorax. No significant interval changes are noted in the infiltrates in the lung fields and right pleural effusion. Electronically Signed   By: Elmer Picker M.D.   On: 04/26/2021 09:08   DG Chest Port 1 View  Result Date: 04/25/2021 CLINICAL DATA:  Shortness of breath EXAM: PORTABLE CHEST 1 VIEW COMPARISON:  Previous studies including the examination of 04/23/2021 FINDINGS: Cardiac size is within normal limits. There is evidence of previous cardiac surgery. Right chest tube is noted. There is a kink in the distal course of the right chest tube close to the tip. There is possible minimal right apical pneumothorax. There is subcutaneous emphysema in the right chest wall. Increased interstitial markings in the left lower lung fields have not changed. There is patchy infiltrate in the right lower lung fields with no significant change. There is blunting of right lateral CP angle. IMPRESSION: There is possible small right apical pneumothorax. There is a kink in the right chest tube proximally 3 cm from the tip. Infiltrates in both lungs and right pleural effusion have not changed significantly. Electronically Signed   By: Elmer Picker M.D.   On: 04/25/2021 17:30    Procedures Procedures    Medications Ordered in ED Medications - No data to display  ED Course/ Medical Decision Making/ A&P Clinical Course as of 04/26/21 0915  Sun Apr 25, 2021  1709 Chest x-ray interpreted by me as effusion and infiltrate on the right side.  No clear pneumothorax identified but may be there is a small one.  Awaiting radiology reading. [MB]  1734 Discussed with Dr. Nils Pyle cardiothoracic surgery who is going to come down and see the patient. [MB]  2774 Patient seen by Dr. Nils Pyle.  The catheter did flush.  He feels the patient needs to come back into the  hospital for some antibiotics and breathing treatments.  Probably needs a  catheter exchange.  He will be admitted to the cardiothoracic service. [MB]    Clinical Course User Index [MB] Hayden Rasmussen, MD                           Medical Decision Making Amount and/or Complexity of Data Reviewed Labs: ordered. Radiology: ordered.  Risk Decision regarding hospitalization.  This patient complains of cough, increased right-sided chest pain and shortness of breath; this involves an extensive number of treatment Options and is a complaint that carries with it a high risk of complications and morbidity. The differential includes pneumothorax, PE, pneumonia, aspiration, pleural effusion or fibrosis  I ordered, reviewed and interpreted labs, which included CBC with normal white count normal hemoglobin, chemistries normal other than mildly elevated glucose, COVID and flu negative, blood culture sent I ordered medication IV fluids and reviewed PMP when indicated. I ordered imaging studies which included chest x-ray and I independently    visualized and interpreted imaging which showed increased right-sided effusion, question possible small pneumothorax Additional history obtained from patient's wife Previous records obtained and reviewed in epic including prior ED visits and operative note I consulted cardiothoracic surgery Dr. Darcey Nora and discussed lab and imaging findings and discussed disposition.  Cardiac monitoring reviewed, normal sinus rhythm Social determinants considered, no significant barriers Critical Interventions: None  After the interventions stated above, I reevaluated the patient and found patient to be uncomfortable although otherwise hemodynamically stable. Admission and further testing considered, patient will need admission for further management of his pulmonary complaint.  Patient in agreement with him.          Final Clinical Impression(s) / ED  Diagnoses Final diagnoses:  Pleural effusion on right  Malignant neoplasm of lung, unspecified laterality, unspecified part of lung ([place])  Pulmonary fibrosis ([place])    Rx / DC Orders ED Discharge Orders     None         Hayden Rasmussen, MD 04/26/21 [phone]

## 2021-04-25 NOTE — Progress Notes (Addendum)
°  Subjective: Difficulty breathing after R indwelling chest catheter 3 wks for post lobectomy space and effusion became nonfunctional this am. CXR with R lung infiltrate/ effusion, attempt to clear the catheter, suction catheter unsuccessful. Catheter usually drains > 100 cc daily Patient needs nasal cannula O2 to breath  comfortably and has coarse rhonchi/wheezing on exam. Expectorates yellow phlegm. PMHx of pulmonary fibrosis. Not on home O2 but needed in ED  Objective: Vital signs in last 24 hours: Temp:  [97.7 F (36.5 C)] 97.7 F (36.5 C) (02/26 1635) Pulse Rate:  [110] 110 (02/26 1635) Resp:  [20] 20 (02/26 1635) BP: (121)/(93) 121/93 (02/26 1635) SpO2:  [90 %-95 %] 95 % (02/26 1658) Weight:  [93 kg] 93 kg (02/26 1656)  Hemodynamic parameters for last 24 hours:    Intake/Output from previous day: No intake/output data recorded. Intake/Output this shift: No intake/output data recorded. EXAM Anxious, uncomfortable Bilateral coarse breath sounds/wheeze R>L  Lab Results: Recent Labs    04/25/21 1655  WBC 8.6  HGB 14.4  HCT 40.9  PLT 305   BMET:  Recent Labs    04/25/21 1655  NA 136  K 3.8  CL 100  CO2 27  GLUCOSE 133*  BUN 11  CREATININE 1.01  CALCIUM 8.8*    PT/INR: No results for input(s): LABPROT, INR in the last 72 hours. ABG    Component Value Date/Time   PHART 7.402 03/02/2021 1124   HCO3 23.7 03/02/2021 1124   TCO2 29 04/13/2021 1639   ACIDBASEDEF 0.5 03/02/2021 1124   O2SAT 97.6 03/02/2021 1124   CBG (last 3)  No results for input(s): GLUCAP in the last 72 hours.  Assessment/Plan: S/P R lobectomy Jan 2023 for cancer Has needed R chest catheter for pntx and effusion last 3wks. Did ok at home until drainage stopped  last 24 hrs, then has developed difficulty breathing. Attept to clear tbe in ED w/o success. Admit to replace nonfunctional tube in IR with imaging due to recent resection and treat poss pneumonia Plan d/w patient and wife and  they understand    LOS: 0 days    Dahlia Byes 04/25/2021

## 2021-04-25 NOTE — Progress Notes (Signed)
Lactic Acid 2.0.  Dr. Darcey Nora notified.  Orders received and carried out.  Will continue to monitor.

## 2021-04-25 NOTE — ED Notes (Signed)
No output reported today in chest tube. 68mL in collection chamber at this time.

## 2021-04-25 NOTE — H&P (Signed)
[address]       [place] [address]             [phone]        [facility] [facility] Medical Record #[id number] Date of Birth: 05/12/1950  Referring: No ref. provider found Primary Care: Seward Carol, MD Primary Cardiologist:Henry Nicholes Stairs III, MD  Chief Complaint:    Chief Complaint  Patient presents with   Post-op Problem    Chest tube    History of Present Illness:     Patient examined in the ED and chest x-ray images reviewed after he called from home saying that he was having difficulty breathing after his chest pigtail catheter stopped draining about 24 hours ago.  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  He had a postoperative hydropneumothorax and has had a pigtail catheter in place for the last 3 weeks.  The space has progressively resolved but the catheter still has been draining significant amount.  Earlier this morning the patient called concerned that the chest tube had stopped draining.  Over the course the day he has become more anxious and uncomfortable breathing and feels somewhat full on the right side.  When he was first checked into the ED for his chest x-ray his room air saturation was in the high 80s.  Chest x-ray shows some increased infiltrate/opacification of the right upper lung field.  The catheter has twisted but is still in good intrathoracic position.  I attempted to aspirate and flush the catheter to open it back up but was unsuccessful.  The patient was placed on oxygen with improvement in his saturation and his breathing.  He has had yellow cloudy sputum production and was having bilateral coarse rhonchi and wheezing.  His white count and temperature are satisfactory but the patient will be admitted for exchange of the occluded pigtail catheter and treatment of possible pneumonia.  Current  Activity/ Functional Status: Poor strength and energy following surgery.  Some weight loss with poor appetite   Zubrod Score: At the time of surgery this patients most appropriate activity status/level should be described as: []     0    Normal activity, no symptoms []     1    Restricted in physical strenuous activity but ambulatory, able to do out light work []     2    Ambulatory and capable of self care, unable to do work activities, up and about                 more than 50%  Of the time                            [x]     3    Only limited self care, in bed greater than 50% of waking hours []     4    Completely disabled, no self care, confined to bed or chair []     5    Moribund  Past Medical History:  Diagnosis Date   Anxiety    Arthritis    "lower back" (04/07/2017)   BPH (benign prostatic hypertrophy)    C. difficile diarrhea    Chronic lower back pain    Coronary artery disease 1989   cabg with dvg to rca    Depression    GERD (  gastroesophageal reflux disease)    Hyperlipidemia    Hypertension    Insomnia    Myocardial infarction ([place]) 2002   OSA on CPAP    cpap setting of 60 per pt   Osteoarthritis    "hips" (04/07/2017)   PAD (peripheral artery disease) ([place])    Peripheral vascular disease ([place])    Pneumonia 1989   S/P CABG   Pulmonary fibrosis ([place])    "one time" (04/07/2017)   Spinal stenosis     Past Surgical History:  Procedure Laterality Date   ABDOMINAL AORTAGRAM N/A 03/15/2011   Procedure: ABDOMINAL Maxcine Ham;  Surgeon: Serafina Mitchell, MD;  Location: [facility] CATH LAB;  Service: Cardiovascular;  Laterality: N/A;   ABDOMINAL AORTAGRAM N/A 04/12/2011   Procedure: ABDOMINAL Maxcine Ham;  Surgeon: Serafina Mitchell, MD;  Location: [facility] CATH LAB;  Service: Cardiovascular;  Laterality: N/A;   BACK SURGERY     BRONCHIAL BIOPSY  03/03/2021   Procedure: BRONCHIAL BIOPSIES;  Surgeon: Garner Nash, DO;  Location: [facility];  Service: Pulmonary;;   BRONCHIAL BRUSHINGS  03/03/2021    Procedure: BRONCHIAL BRUSHINGS;  Surgeon: Garner Nash, DO;  Location: [facility];  Service: Pulmonary;;   BRONCHIAL NEEDLE ASPIRATION BIOPSY  03/03/2021   Procedure: BRONCHIAL NEEDLE ASPIRATION BIOPSIES;  Surgeon: Garner Nash, DO;  Location: [facility];  Service: Pulmonary;;   CARDIAC CATHETERIZATION  08/1987   CORONARY ANGIOPLASTY WITH STENT PLACEMENT  ~ 2002   [facility], [place]   CORONARY ARTERY BYPASS GRAFT  09/1987   "CABG X1";  [facility]; [place]; "RCA"   FIDUCIAL MARKER PLACEMENT  03/03/2021   Procedure: FIDUCIAL DYE MARKING;  Surgeon: Garner Nash, DO;  Location: [facility];  Service: Pulmonary;;   INTERCOSTAL NERVE BLOCK  03/03/2021   Procedure: INTERCOSTAL NERVE BLOCK;  Surgeon: Lajuana Matte, MD;  Location: [facility];  Service: Thoracic;;   JOINT REPLACEMENT     KNEE ARTHROSCOPY Left    LAPAROSCOPIC CHOLECYSTECTOMY     LYMPH NODE BIOPSY  03/03/2021   Procedure: LYMPH NODE BIOPSY;  Surgeon: Lajuana Matte, MD;  Location: [facility];  Service: Thoracic;;   LYMPH NODE DISSECTION  03/03/2021   Procedure: LYMPH NODE DISSECTION;  Surgeon: Lajuana Matte, MD;  Location: [facility];  Service: Thoracic;;   PERIPHERAL VASCULAR INTERVENTION Left 2016   "put a stent; right branch of left femoral"   POSTERIOR LUMBAR FUSION  2009   lumbar fusion, S L 5 to L 4   RIGHT/LEFT HEART CATH AND CORONARY/GRAFT ANGIOGRAPHY N/A 10/05/2020   Procedure: RIGHT/LEFT HEART CATH AND CORONARY/GRAFT ANGIOGRAPHY;  Surgeon: Nelva Bush, MD;  Location: [facility] CV LAB;  Service: Cardiovascular;  Laterality: N/A;   SHOULDER ARTHROSCOPY W/ ROTATOR CUFF REPAIR Right 2006   SHOULDER ARTHROSCOPY WITH SUBACROMIAL DECOMPRESSION AND OPEN ROTATOR C Left ~ [place]     TOTAL HIP ARTHROPLASTY Right 2012   TOTAL HIP ARTHROPLASTY Left 04/07/2017   TOTAL HIP ARTHROPLASTY Left 04/07/2017   Procedure: LEFT TOTAL HIP ARTHROPLASTY;  Surgeon: Earlie Server,  MD;  Location: [facility];  Service: Orthopedics;  Laterality: Left;   VIDEO BRONCHOSCOPY WITH RADIAL ENDOBRONCHIAL ULTRASOUND  03/03/2021   Procedure: VIDEO BRONCHOSCOPY WITH RADIAL ENDOBRONCHIAL ULTRASOUND;  Surgeon: Garner Nash, DO;  Location: [facility];  Service: Pulmonary;;    Social History   Tobacco Use  Smoking Status Former   Packs/day: 2.00   Years: 15.00   Pack years: 30.00   Types:  Cigarettes   Quit date: 03/01/1987   Years since quitting: 34.1  Smokeless Tobacco Never  Tobacco Comments   03/02/21-Pt instructed to not vape or drink alcohol for 24 hours prior to surgery.     Social History   Substance and Sexual Activity  Alcohol Use Yes   Comment: couple drinks/month     Allergies  Allergen Reactions   Ambien [Zolpidem Tartrate] Other (See Comments)    Bad dreams/Vivid Dreams    Current Facility-Administered Medications  Medication Dose Route Frequency Provider Last Rate Last Admin   ALPRAZolam Duanne Moron) tablet 0.5 mg  0.5 mg Oral TID PRN Dahlia Byes, MD   0.5 mg at 04/25/21 1947   aspirin EC tablet 81 mg  81 mg Oral Daily Dahlia Byes, MD   81 mg at 04/25/21 1944   [START ON 04/26/2021] atorvastatin (LIPITOR) tablet 80 mg  80 mg Oral Daily Dahlia Byes, MD       Derrill Memo ON 04/26/2021] budesonide (ENTOCORT EC) 24 hr capsule 3 mg  3 mg Oral Daily Dahlia Byes, MD       cefTRIAXone (ROCEPHIN) 2 g in sodium chloride 0.9 % 100 mL IVPB  2 g Intravenous Q24H Dahlia Byes, MD 200 mL/hr at 04/25/21 1951 2 g at 04/25/21 1951   [START ON 04/26/2021] clopidogrel (PLAVIX) tablet 75 mg  75 mg Oral Daily Dahlia Byes, MD       dextrose 5 % and 0.45 % NaCl with KCl 10 mEq/L infusion   Intravenous Continuous Dahlia Byes, MD       docusate sodium (COLACE) capsule 100 mg  100 mg Oral BID Dahlia Byes, MD       enoxaparin (LOVENOX) injection 40 mg  40 mg Subcutaneous Q24H Dahlia Byes, MD       gabapentin (NEURONTIN) capsule 100 mg  100 mg Oral BID Dahlia Byes, MD       guaiFENesin (MUCINEX) 12 hr tablet 600 mg  600 mg Oral BID Dahlia Byes, MD       HYDROcodone-acetaminophen (NORCO/VICODIN) 5-325 MG per tablet 2 tablet  2 tablet Oral Q6H PRN Dahlia Byes, MD   2 tablet at 04/25/21 1947   [START ON 04/26/2021] isosorbide mononitrate (IMDUR) 24 hr tablet 30 mg  30 mg Oral Daily Dahlia Byes, MD       levalbuterol Penne Lash) nebulizer solution 0.63 mg  0.63 mg Nebulization Q6H Dahlia Byes, MD       Derrill Memo ON 04/26/2021] metaxalone (SKELAXIN) tablet 800 mg  800 mg Oral TID Dahlia Byes, MD       methocarbamol (ROBAXIN) 500 mg in dextrose 5 % 50 mL IVPB  500 mg Intravenous Q6H PRN Dahlia Byes, MD       methylPREDNISolone sodium succinate (SOLU-MEDROL) 40 mg/mL injection 40 mg  40 mg Intravenous Q12H Dahlia Byes, MD   40 mg at 04/25/21 1948   [START ON 04/26/2021] metoprolol succinate (TOPROL-XL) 24 hr tablet 50 mg  50 mg Oral Daily Dahlia Byes, MD       multivitamin with minerals tablet 1 tablet  1 tablet Oral Daily Dahlia Byes, MD   1 tablet at 04/25/21 1944   [START ON 04/26/2021] pantoprazole (PROTONIX) EC tablet 40 mg  40 mg Oral Daily Dahlia Byes, MD       Derrill Memo ON 04/26/2021] PARoxetine (PAXIL) tablet 40 mg  40 mg Oral Daily Dahlia Byes, MD       sodium chloride flush (NS) 0.9 % injection 3 mL  3 mL Intravenous Q12H Rama Mcclintock,  Collier Salina, MD       sorbitol 70 % solution 30 mL  30 mL Oral Daily PRN Dahlia Byes, MD       tamsulosin [facility]) capsule 0.4 mg  0.4 mg Oral Daily Dahlia Byes, MD   0.4 mg at 04/25/21 1944   Current Outpatient Medications  Medication Sig Dispense Refill   acetaminophen (TYLENOL) 500 MG tablet Take 500 mg by mouth every 6 (six) hours as needed for moderate pain.     ALPRAZolam (XANAX) 0.5 MG tablet Take 0.25-0.5 mg by mouth 3 (three) times daily as needed for anxiety or sleep.     antiseptic oral rinse (BIOTENE) LIQD 15 mLs by Mouth Rinse route as needed for dry mouth.      atorvastatin (LIPITOR) 80 MG tablet TAKE 1 TABLET BY MOUTH EVERY DAY (Patient taking differently: Take 80 mg by mouth daily.) 90 tablet 1   budesonide (ENTOCORT EC) 3 MG 24 hr capsule Take 3-6 mg by mouth See admin instructions. Takes 2 capsules (6 mg) by mouth in the morning and take 1 capsule (3 mg) by mouth at night     cholecalciferol (VITAMIN D3) 25 MCG (1000 UNIT) tablet Take 1,000 Units by mouth in the morning.     clopidogrel (PLAVIX) 75 MG tablet TAKE 1 TABLET BY MOUTH EVERY DAY (Patient taking differently: Take 75 mg by mouth daily.) 30 tablet 11   gabapentin (NEURONTIN) 100 MG capsule Take 2 capsules (200 mg total) by mouth 3 (three) times daily. 90 capsule 1   HYDROcodone-acetaminophen (NORCO) 10-325 MG tablet Take 1 tablet by mouth every 4 (four) hours as needed for moderate pain or severe pain.     hyoscyamine (LEVSIN, ANASPAZ) 0.125 MG tablet Take 0.125 mg by mouth every 4 (four) hours as needed for cramping.      isosorbide mononitrate (IMDUR) 30 MG 24 hr tablet Take 1 tablet (30 mg total) by mouth daily. (Patient taking differently: Take 30 mg by mouth daily as needed (chest pain).) 30 tablet 11   Lidocaine 4 % PTCH Apply 1 patch topically as needed (pain).     metaxalone (SKELAXIN) 800 MG tablet Take 800 mg by mouth 3 (three) times daily as needed for muscle spasms.     metoprolol succinate (TOPROL-XL) 50 MG 24 hr tablet TAKE 1 TABLET BY MOUTH EVERY DAY AFTER A MEAL (Patient taking differently: 50 mg daily.) 90 tablet 2   nitroGLYCERIN (NITROSTAT) 0.4 MG SL tablet Place 0.4 mg under the tongue every 5 (five) minutes as needed for chest pain.     pantoprazole (PROTONIX) 40 MG tablet TAKE 1 TABLET BY MOUTH DAILY BEFORE BREAKFAST (Patient taking differently: Take 40 mg by mouth daily before breakfast.) 90 tablet 1   PARoxetine (PAXIL) 40 MG tablet Take 40 mg by mouth in the morning.  6   Probiotic Product (PROBIOTIC PO) Take 1 capsule by mouth daily.     sodium chloride (OCEAN) 0.65 %  SOLN nasal spray Place 1 spray into both nostrils as needed for congestion.     tamsulosin (FLOMAX) 0.4 MG CAPS capsule Take 0.4 mg by mouth 2 (two) times daily.  5    (Not in a hospital admission)   Family History  Problem Relation Age of Onset   Heart disease Mother    Cancer Father      Review of Systems:   ROS      Cardiac Review of Systems: Y or  [    ]= no  Chest Pain [    ]  Resting SOB [   y] Exertional SOB  [ y ]  Orthopnea [  ]   Pedal Edema [   ]    Palpitations [  ] Syncope  [  ]   Presyncope [   ]  General Review of Systems: [Y] = yes [  ]=no Constitional: recent weight change [  ]; anorexia [ y ]; fatigue [  y]; nausea [  ]; night sweats [  ]; fever [  ]; or chills [  ]                                                               Dental: Last Dentist visit:   Eye : blurred vision [  ]; diplopia [   ]; vision changes [  ];  Amaurosis fugax[  ]; Resp: cough Blue.Reese  ];  wheezing[y  ];  hemoptysis[  ]; shortness of breath[y  ]; paroxysmal nocturnal dyspnea[  ]; dyspnea on exertion[  ]; or orthopnea[  ];  GI:  gallstones[  ], vomiting[  ];  dysphagia[  ]; melena[  ];  hematochezia [  ]; heartburn[  ];   Hx of  Colonoscopy[  ]; GU: kidney stones [  ]; hematuria[  ];   dysuria [  ];  nocturia[  ];  history of     obstruction [  ]; urinary frequency [  ]             Skin: rash, swelling[  ];, hair loss[  ];  peripheral edema[  ];  or itching[  ]; Musculosketetal: myalgias[  ];  joint swelling[  ];  joint erythema[  ];  joint pain[  ];  back pain[  ];  Heme/Lymph: bruising[  ];  bleeding[  ];  anemia[  ];  Neuro: TIA[  ];  headaches[  ];  stroke[  ];  vertigo[  ];  seizures[  ];   paresthesias[  ];  difficulty walking[  ];  Psych:depression[  ]; anxiety[  ];  Endocrine: diabetes[  ];  thyroid dysfunction[  ];                        {      Physical Exam: BP (!) 141/95    Pulse 100    Temp 98.9 F (37.2 C) (Oral)    Resp 20    Ht 6' (1.829 m)    Wt 93 kg    SpO2 96%    BMI  27.80 kg/m        Physical Exam  General: 71 year old male anxious tachypneic with a wet cough HEENT:normocephalic pupils equal , dentition adequate Neck: Supple without JVD, adenopathy, or bruit Chest: Coarse rhonchi with wheezing right greater than left.  Surgical incisions well-healed.  Pigtail catheter secured to the right anterior third interspace with some mild erythema but no drainage around the site.  Well-healed sternal incision from old CABG             Cardiovascular: Regular rate and rhythm, no murmur, no gallop, peripheral pulses             palpable in all extremities Abdomen:  Soft, nontender, no palpable mass or organomegaly Extremities: Warm, well-perfused, no clubbing cyanosis edema or tenderness,  some decrease in skin turgor              no venous stasis changes of the legs Rectal/GU: Deferred Neuro: Grossly non--focal and symmetrical throughout Skin: Clean and dry without rash or ulceration   Diagnostic Studies & Laboratory data:     Recent Radiology Findings:   DG Chest Port 1 View  Result Date: 04/25/2021 CLINICAL DATA:  Shortness of breath EXAM: PORTABLE CHEST 1 VIEW COMPARISON:  Previous studies including the examination of 04/23/2021 FINDINGS: Cardiac size is within normal limits. There is evidence of previous cardiac surgery. Right chest tube is noted. There is a kink in the distal course of the right chest tube close to the tip. There is possible minimal right apical pneumothorax. There is subcutaneous emphysema in the right chest wall. Increased interstitial markings in the left lower lung fields have not changed. There is patchy infiltrate in the right lower lung fields with no significant change. There is blunting of right lateral CP angle. IMPRESSION: There is possible small right apical pneumothorax. There is a kink in the right chest tube proximally 3 cm from the tip. Infiltrates in both lungs and right pleural effusion have not changed significantly.  Electronically Signed   By: Elmer Picker M.D.   On: 04/25/2021 17:30     I have independently reviewed the above radiologic studies and discussed with the patient   Recent Lab Findings: Lab Results  Component Value Date   WBC 8.6 04/25/2021   HGB 14.4 04/25/2021   HCT 40.9 04/25/2021   PLT 305 04/25/2021   GLUCOSE 133 (H) 04/25/2021   CHOL 97 10/04/2020   TRIG 46 10/04/2020   HDL 35 (L) 10/04/2020   LDLCALC 53 10/04/2020   ALT 20 04/20/2021   AST 21 04/20/2021   NA 136 04/25/2021   K 3.8 04/25/2021   CL 100 04/25/2021   CREATININE 1.01 04/25/2021   BUN 11 04/25/2021   CO2 27 04/25/2021   INR 0.9 03/02/2021   HGBA1C 5.3 10/03/2020      Assessment / Plan:   Nonfunctional right pigtail catheter placed for hydropneumothorax after right upper lobectomy.  Evidence of accumulating fluid which has become symptomatic in this patient with underlying COPD and pulmonary fibrosis.  The patient be admitted for pigtail catheter exchange by IR and for treatment of possible pneumonia.       @ME1 @ 04/25/2021 7:55 PM

## 2021-04-25 NOTE — ED Notes (Signed)
Report attempted 

## 2021-04-25 NOTE — Telephone Encounter (Signed)
The patient called today, phone #[phone]. The patient had a right pulmonary resection by Dr. Kipp Brood and has a pigtail catheter for residual pneumothorax. The tube became kinked overnight and has had no drainage. Yesterday the drainage was approximately 200 cc. The patient has no new symptoms and is doing well. It appears the catheter was placed for evaluation of air and on last chest x-ray the lung was expanded. The patient will return to the office tomorrow with chest x-ray to be evaluated about the status of the tube.  We will try to keep the tube from kinking again.

## 2021-04-25 NOTE — ED Triage Notes (Addendum)
Post R lung resection  (January). Has chest tube (2 weeks ago). Tube got occluded last night. Has become SOB and painful on right side this morning. Aox4. 93% @ 2LPM. Afebrile.  Pt working with [employer] [phone]

## 2021-04-26 ENCOUNTER — Inpatient Hospital Stay (HOSPITAL_COMMUNITY): Payer: PPO

## 2021-04-26 ENCOUNTER — Encounter (HOSPITAL_COMMUNITY): Payer: Self-pay | Admitting: Cardiothoracic Surgery

## 2021-04-26 LAB — EXPECTORATED SPUTUM ASSESSMENT W GRAM STAIN, RFLX TO RESP C

## 2021-04-26 LAB — CBC
HCT: 39.5 % (ref 39.0–52.0)
Hemoglobin: 13.6 g/dL (ref 13.0–17.0)
MCH: 31.6 pg (ref 26.0–34.0)
MCHC: 34.4 g/dL (ref 30.0–36.0)
MCV: 91.9 fL (ref 80.0–100.0)
Platelets: 315 10*3/uL (ref 150–400)
RBC: 4.3 MIL/uL (ref 4.22–5.81)
RDW: 12.7 % (ref 11.5–15.5)
WBC: 6.2 10*3/uL (ref 4.0–10.5)
nRBC: 0 % (ref 0.0–0.2)

## 2021-04-26 LAB — RESPIRATORY PANEL BY PCR

## 2021-04-26 LAB — COMPREHENSIVE METABOLIC PANEL
ALT: 17 U/L (ref 0–44)
AST: 26 U/L (ref 15–41)
Albumin: 2.6 g/dL — ABNORMAL LOW (ref 3.5–5.0)
Alkaline Phosphatase: 88 U/L (ref 38–126)
Anion gap: 10 (ref 5–15)
BUN: 10 mg/dL (ref 8–23)
CO2: 24 mmol/L (ref 22–32)
Calcium: 9 mg/dL (ref 8.9–10.3)
Chloride: 104 mmol/L (ref 98–111)
Creatinine, Ser: 0.84 mg/dL (ref 0.61–1.24)
GFR, Estimated: >60 mL/min (ref >60)
Glucose, Bld: 231 mg/dL — ABNORMAL HIGH (ref 70–99)
Potassium: 3.9 mmol/L (ref 3.5–5.1)
Sodium: 138 mmol/L (ref 135–145)
Total Bilirubin: 0.3 mg/dL (ref 0.3–1.2)
Total Protein: 6.1 g/dL — ABNORMAL LOW (ref 6.5–8.1)

## 2021-04-26 LAB — PROTIME-INR
INR: 1 (ref 0.8–1.2)
Prothrombin Time: 13.1 seconds (ref 11.4–15.2)

## 2021-04-26 MED ORDER — ENOXAPARIN SODIUM 40 MG/0.4ML IJ SOSY
40.0000 mg | PREFILLED_SYRINGE | INTRAMUSCULAR | Status: DC
  Administered 2021-04-27: 40 mg via SUBCUTANEOUS
  Filled 2021-04-26: qty 0.4

## 2021-04-26 MED ORDER — KCL IN DEXTROSE-NACL 10-5-0.45 MEQ/L-%-% IV SOLN
INTRAVENOUS | Status: DC
Start: 2021-04-26 — End: 2021-04-27
  Filled 2021-04-26 (×3): qty 1000

## 2021-04-26 NOTE — Hospital Course (Addendum)
Hospital Course:  Richard Parrish is a 71 year old male with history of heart disease status post CABG and history of pulmonary fibrosis underwent right Upper lobectomy in January for stage I adenocarcinoma.  He had a postoperative hydropneumothorax and has had a pigtail catheter in place for the last 3 weeks.  The space has progressively resolved but the catheter still has been draining significant amount.  The morning of 2/26 the patient called concerned that the chest tube had stopped draining.  Over the course the day he has become more anxious and uncomfortable breathing and feels somewhat full on the right side.  When he was first checked into the ED for his chest x-ray his room air saturation was in the high 80s.  Chest x-ray shows some increased infiltrate/opacification of the right upper lung field.  The catheter has twisted but is still in good intrathoracic position.  He was evaluated by Dr. Prescott Gum who attempted to aspirate and flush the catheter to open it back up but was unsuccessful.  The patient was placed on oxygen with improvement in his saturation and his breathing. He has had yellow cloudy sputum production and was having bilateral coarse rhonchi and wheezing.  His white count and temperature are satisfactory but the patient will be admitted for exchange of the occluded pigtail catheter and treatment of possible pneumonia.  Hospital Course:  The patient was started on Ceftriaxone for suspected pneumonia.  His chest tube fell out after AM chest xray, which showed no evidence of pleural effusion.  Interventional Radiology was consulted but ultimately after review of CXR did not feel chest tube would need to be replaced.  The patient was observed overnight.  Repeat CXR was obtained and showed ***.  He will be converted to oral Augmentin for a 7 day course for pneumonia treatment.  He is ambulating without difficulty.  He is felt medically stable for discharge home today.

## 2021-04-26 NOTE — Progress Notes (Addendum)
° °   °  [address]       [place] [address]             [phone]    Subjective:  Patient feeling better.  Much improved since he presented.    Objective: Vital signs in last 24 hours: Temp:  [97.7 F (36.5 C)-98.9 F (37.2 C)] 98.4 F (36.9 C) (02/26 2300) Pulse Rate:  [91-110] 100 (02/27 0439) Cardiac Rhythm: Normal sinus rhythm (02/27 0709) Resp:  [16-30] 18 (02/27 0439) BP: (121-149)/(85-96) 149/85 (02/26 2300) SpO2:  [90 %-96 %] 96 % (02/27 0439) Weight:  [93 kg] 93 kg (02/26 1656)  Intake/Output from previous day: 02/26 0701 - 02/27 0700 In: -  Out: 320 [Urine:200; Chest Tube:120]  General appearance: alert, cooperative, and no distress Heart: regular rate and rhythm Lungs: clear to auscultation bilaterally Abdomen: soft, non-tender; bowel sounds normal; no masses,  no organomegaly Extremities: extremities normal, atraumatic, no cyanosis or edema  Lab Results: Recent Labs    04/25/21 2122 04/26/21 0100  WBC 8.5 6.2  HGB 14.0 13.6  HCT 40.3 39.5  PLT 294 315   BMET:  Recent Labs    04/25/21 1655 04/25/21 2122 04/26/21 0100  NA 136  --  138  K 3.8  --  3.9  CL 100  --  104  CO2 27  --  24  GLUCOSE 133*  --  231*  BUN 11  --  10  CREATININE 1.01 0.96 0.84  CALCIUM 8.8*  --  9.0    PT/INR: No results for input(s): LABPROT, INR in the last 72 hours. ABG    Component Value Date/Time   PHART 7.402 03/02/2021 1124   HCO3 23.7 03/02/2021 1124   TCO2 29 04/13/2021 1639   ACIDBASEDEF 0.5 03/02/2021 1124   O2SAT 97.6 03/02/2021 1124   CBG (last 3)  No results for input(s): GLUCAP in the last 72 hours.  Assessment/Plan:  Hydropneumothorax with possible pneumonia- CT fell out this morning, per patient had stopped draining at home... for IR replacement today ID- afebrile, no leukocytosis- on Ceftriaxone for suspected pneumonia CV- hemodynamically stable, SBP in the 140s- on Toprol, Imdur H/O Pulmonary Fibrosis continue  inhalers Dispo- patient stable, chest tube fell out this morning, for IR placement this morning. Continue Ceftriaxone for pneumonia, continue current care   LOS: 1 day    Ellwood Handler, PA-C 04/26/2021  Cxr  this am is improved with tube out- will hold on replacing pigtail, cont antibiotics and get f/u CXR in am

## 2021-04-26 NOTE — Plan of Care (Signed)

## 2021-04-26 NOTE — Discharge Summary (Cosign Needed)
Physician Discharge Summary  Patient ID: Richard Parrish MRN: [id number] DOB/AGE: 05-25-1950 71 y.o.  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[place]) 03/19/2021   Obstructive sleep apnea syndrome 03/19/2021   Malignant neoplasm of upper lobe, right bronchus or lung ([place]) 03/19/2021   Lymphocytic colitis 03/19/2021   Low back pain 03/19/2021   Irritable bowel syndrome 03/19/2021   Insomnia 03/19/2021   Frequency of micturition 03/19/2021   Confusional state 03/19/2021   Chronic pain syndrome 03/19/2021   Gastroesophageal reflux disease 03/19/2021   Anxiety 03/19/2021   Amnesia 03/19/2021   Acquired absence of lung (part of) 03/19/2021   Malignant neoplasm of upper lobe of right lung ([place]) 03/08/2021   Lung nodule 03/03/2021   S/P Robotic Assisted Right Video Assisted Thoracoscopy with Right Upper Lobectomy, Right Lower Lobe wedge resection 03/03/2021   Malnutrition of moderate degree 10/05/2020   Shortness of breath    Unstable angina ([place]) 10/03/2020   Chest pain    Hx of CABG    Disc degeneration, lumbar 02/10/2018   Aortic atherosclerosis ([place]) 11/13/2017   Pure hypercholesterolemia 11/13/2017   Degenerative joint disease of left hip 04/07/2017   Dog bite of face 08/08/2016   Right hip pain 06/01/2016   Achilles tendon pain 12/31/2015   Postinflammatory pulmonary fibrosis ([place]) 07/24/2013   Cough 07/24/2013   CAD (coronary artery disease), native coronary artery 04/01/2013   Essential hypertension, benign 12/12/2012    Class: Chronic   HLD  (hyperlipidemia) 12/12/2012   Atherosclerosis of native arteries of the extremities with intermittent claudication 11/25/2011   Peripheral vascular disease, unspecified ([place]) 02/28/2011   Discharge Diagnoses:   Patient Active Problem List   Diagnosis Date Noted   Recurrent right pleural effusion 04/25/2021   Pleural effusion on right 04/25/2021   Hypotension 04/20/2021   BPH (benign prostatic hyperplasia) 04/19/2021   Pneumothorax 04/13/2021   Tobacco user 03/19/2021   Severe major depression, single episode, without psychotic features ([place]) 03/19/2021   Sciatica 03/19/2021   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[place]) 03/19/2021   Obstructive sleep apnea syndrome 03/19/2021   Malignant neoplasm of upper lobe, right bronchus or lung ([place]) 03/19/2021   Lymphocytic colitis 03/19/2021   Low back pain 03/19/2021   Irritable bowel syndrome 03/19/2021   Insomnia 03/19/2021   Frequency of micturition 03/19/2021   Confusional state 03/19/2021   Chronic pain syndrome 03/19/2021   Gastroesophageal reflux disease 03/19/2021   Anxiety 03/19/2021   Amnesia 03/19/2021   Acquired absence of lung (part of) 03/19/2021   Malignant neoplasm of upper lobe of right lung ([place]) 03/08/2021   Lung nodule 03/03/2021   S/P Robotic Assisted Right Video Assisted Thoracoscopy with Right Upper Lobectomy, Right Lower Lobe wedge resection 03/03/2021   Malnutrition of moderate degree 10/05/2020   Shortness of breath    Unstable angina ([place]) 10/03/2020   Chest pain    Hx of CABG    Disc degeneration, lumbar 02/10/2018   Aortic atherosclerosis ([place]) 11/13/2017   Pure hypercholesterolemia 11/13/2017   Degenerative joint disease of left hip 04/07/2017   Dog bite of face 08/08/2016   Right hip pain 06/01/2016  Achilles tendon pain 12/31/2015   Postinflammatory pulmonary fibrosis ([place]) 07/24/2013   Cough 07/24/2013   CAD (coronary artery disease), native coronary artery 04/01/2013   Essential hypertension, benign  12/12/2012    Class: Chronic   HLD (hyperlipidemia) 12/12/2012   Atherosclerosis 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  He had a postoperative hydropneumothorax and has had a pigtail catheter in place for the last 3 weeks.  The space has progressively resolved but the catheter still has been draining significant amount.  The morning of 2/26 the patient called concerned that the chest tube had stopped draining.  Over the course the day he has become more anxious and uncomfortable breathing and feels somewhat full on the right side.  When he was first checked into the ED for his chest x-ray his room air saturation was in the high 80s.  Chest x-ray shows some increased infiltrate/opacification of the right upper lung field.  The catheter has twisted but is still in good intrathoracic position.  He was evaluated by Dr. Prescott Gum who attempted to aspirate and flush the catheter to open it back up but was unsuccessful.  The patient was placed on oxygen with improvement in his saturation and his breathing. He has had yellow cloudy sputum production and was having bilateral coarse rhonchi and wheezing.  His white count and temperature are satisfactory but the patient will be admitted for exchange of the occluded pigtail catheter and treatment of possible pneumonia.  Hospital Course:  The patient was started on Ceftriaxone for suspected pneumonia.  His chest tube fell out after AM chest xray, which showed no evidence of pleural effusion.  Interventional Radiology was consulted but ultimately after review of CXR did not feel chest tube would need to be replaced.  The patient was  observed overnight.  Repeat CXR was obtained and showed no significant pneumothorax or fluid collection.  He will be converted to oral Augmentin for a 7 day course for treatment of possible pneumonia.  He is ambulating without difficulty.  He is felt medically stable for discharge home today.  Consults:  Interventional Radiology  Discharge Exam: Blood pressure 140/80, pulse 70, temperature 98.3 F (36.8 C), temperature source Oral, resp. rate 17, height 6' (1.829 m), weight 93 kg, SpO2 98 %.  General appearance: alert, cooperative, and no distress Heart: regular rate and rhythm Lungs: Rare expiratory wheeze, otherwise clear.  Cough is strong and nonproductive. Abdomen: soft, non-tender    Disposition:  Discharged to home in stable condition.     Allergies as of 04/27/2021       Reactions   Ambien [zolpidem Tartrate] Other (See Comments)   Bad dreams/Vivid Dreams        Medication List     TAKE these medications    acetaminophen 500 MG tablet Commonly known as: TYLENOL Take 500 mg by mouth every 6 (six) hours as needed for moderate pain.   ALPRAZolam 0.5 MG tablet Commonly known as: XANAX Take 0.25-0.5 mg by mouth 3 (three) times daily as needed for anxiety or sleep.   amoxicillin-clavulanate 875-125 MG tablet Commonly known as: Augmentin Take 1 tablet by mouth 2 (two) times daily.   antiseptic oral rinse Liqd 15 mLs by  Mouth Rinse route as needed for dry mouth.   atorvastatin 80 MG tablet Commonly known as: LIPITOR TAKE 1 TABLET BY MOUTH EVERY DAY   budesonide 3 MG 24 hr capsule Commonly known as: ENTOCORT EC Take 3-6 mg by mouth See admin instructions. Takes 2 capsules (6 mg) by mouth in the morning and take 1 capsule (3 mg) by mouth at night   cholecalciferol 25 MCG (1000 UNIT) tablet Commonly known as: VITAMIN D3 Take 1,000 Units by mouth in the morning.   clopidogrel 75 MG tablet Commonly known as: PLAVIX TAKE 1 TABLET BY MOUTH EVERY DAY   gabapentin  100 MG capsule Commonly known as: NEURONTIN Take 2 capsules (200 mg total) by mouth 3 (three) times daily.   guaiFENesin 600 MG 12 hr tablet Commonly known as: MUCINEX Take 1 tablet (600 mg total) by mouth 2 (two) times daily as needed.   HYDROcodone-acetaminophen 10-325 MG tablet Commonly known as: NORCO Take 1 tablet by mouth every 4 (four) hours as needed for moderate pain or severe pain.   hyoscyamine 0.125 MG tablet Commonly known as: LEVSIN Take 0.125 mg by mouth every 4 (four) hours as needed for cramping.   isosorbide mononitrate 30 MG 24 hr tablet Commonly known as: IMDUR Take 1 tablet (30 mg total) by mouth daily. What changed:  when to take this reasons to take this   Lidocaine 4 % Ptch Apply 1 patch topically as needed (pain).   metaxalone 800 MG tablet Commonly known as: SKELAXIN Take 800 mg by mouth 3 (three) times daily as needed for muscle spasms.   metoprolol succinate 50 MG 24 hr tablet Commonly known as: TOPROL-XL TAKE 1 TABLET BY MOUTH EVERY DAY AFTER A MEAL What changed: See the new instructions.   nitroGLYCERIN 0.4 MG SL tablet Commonly known as: NITROSTAT Place 0.4 mg under the tongue every 5 (five) minutes as needed for chest pain.   pantoprazole 40 MG tablet Commonly known as: PROTONIX TAKE 1 TABLET BY MOUTH DAILY BEFORE BREAKFAST   PARoxetine 40 MG tablet Commonly known as: PAXIL Take 40 mg by mouth in the morning.   predniSONE 10 MG tablet Commonly known as: DELTASONE Take 4 tablets (40 mg total) by mouth daily for 6 days. On first day, then 3 tablets (30mg ) by mouth on the 2nd day, then 2 tablets (20mg ) by mouth on the third day then 1 tablet (10mg ) by mouth on the 4th day then 1/2 tablet (5mg ) on 5th and 6th days then stop.   PROBIOTIC PO Take 1 capsule by mouth daily.   sodium chloride 0.65 % Soln nasal spray Commonly known as: OCEAN Place 1 spray into both nostrils as needed for congestion.   tamsulosin 0.4 MG Caps  capsule Commonly known as: FLOMAX Take 0.4 mg by mouth 2 (two) times daily.        Follow-up Information     Lajuana Matte, MD Follow up on 04/30/2021.   Specialty: Cardiothoracic Surgery Why: Appointment is at 1:40 Contact information: [place] [address] [phone]                 Signed: Antony Odea, PA-C  04/27/2021, 9:02 AM

## 2021-04-26 NOTE — Progress Notes (Signed)
° °  Chest tube out this am-- per RN  CXR shows NO effusion this am per Dr Vernard Gambles  Discussed with Dr Cleaster Corin procedure

## 2021-04-26 NOTE — Telephone Encounter (Signed)
Please advise on mychart.  ?

## 2021-04-26 NOTE — Progress Notes (Signed)
In patient room to meet and do bedside report. Patient sleeping, patient's pig tail chest tube lying on the floor outside of chest, woke patient and he is unsure who took it out. Notification to Jadene Pierini, and Ellwood Handler, PAs

## 2021-04-26 NOTE — Telephone Encounter (Deleted)
I have reviewed your chart and it looks like you are in the hospital and the chest tube is out today on 2/27 You have a follow-up with Dr. Kipp Brood on Feb 3rd and with me on Feb 13.  We will repeat a chest x-ray at clinic visit.

## 2021-04-27 ENCOUNTER — Inpatient Hospital Stay (HOSPITAL_COMMUNITY): Payer: PPO

## 2021-04-27 MED ORDER — PREDNISONE 10 MG PO TABS: 40.0000 mg | ORAL_TABLET | Freq: Every day | ORAL | 0 refills | Status: AC

## 2021-04-27 MED ORDER — AMOXICILLIN-POT CLAVULANATE 875-125 MG PO TABS: 1.0000 | ORAL_TABLET | Freq: Two times a day (BID) | ORAL | 0 refills | Status: DC

## 2021-04-27 MED ORDER — GUAIFENESIN ER 600 MG PO TB12
600.0000 mg | ORAL_TABLET | Freq: Two times a day (BID) | ORAL | Status: AC | PRN
Start: 2021-04-27 — End: ?

## 2021-04-27 MED ORDER — LEVALBUTEROL HCL 0.63 MG/3ML IN NEBU
0.6300 mg | INHALATION_SOLUTION | Freq: Two times a day (BID) | RESPIRATORY_TRACT | Status: DC
  Administered 2021-04-27: 0.63 mg via RESPIRATORY_TRACT
  Filled 2021-04-27: qty 3

## 2021-04-27 NOTE — Discharge Instructions (Signed)

## 2021-04-27 NOTE — Progress Notes (Signed)
° °   °  [address]       [place] [address]             [phone]    Subjective:  Sitting up finishing breakfast.  Says he feels well and is eager to return home.  Objective: Vital signs in last 24 hours: Temp:  [97.9 F (36.6 C)-98.6 F (37 C)] 98.3 F (36.8 C) (02/28 0300) Pulse Rate:  [70-81] 70 (02/28 0041) Cardiac Rhythm: Normal sinus rhythm;Heart block (02/28 0709) Resp:  [16-22] 17 (02/28 0041) BP: (121-145)/(73-84) 140/80 (02/28 0041) SpO2:  [94 %-100 %] 98 % (02/28 0041)  Intake/Output from previous day: 02/27 0701 - 02/28 0700 In: 360 [P.O.:360] Out: 1000 [Urine:1000]  General appearance: alert, cooperative, and no distress Heart: regular rate and rhythm Lungs: Rare expiratory wheeze, otherwise clear.  Cough is strong and nonproductive. Abdomen: soft, non-tender   Lab Results: Recent Labs    04/25/21 2122 04/26/21 0100  WBC 8.5 6.2  HGB 14.0 13.6  HCT 40.3 39.5  PLT 294 315    BMET:  Recent Labs    04/25/21 1655 04/25/21 2122 04/26/21 0100  NA 136  --  138  K 3.8  --  3.9  CL 100  --  104  CO2 27  --  24  GLUCOSE 133*  --  231*  BUN 11  --  10  CREATININE 1.01 0.96 0.84  CALCIUM 8.8*  --  9.0     PT/INR:  Recent Labs    04/26/21 0734  LABPROT 13.1  INR 1.0   ABG    Component Value Date/Time   PHART 7.402 03/02/2021 1124   HCO3 23.7 03/02/2021 1124   TCO2 29 04/13/2021 1639   ACIDBASEDEF 0.5 03/02/2021 1124   O2SAT 97.6 03/02/2021 1124   CBG (last 3)  No results for input(s): GLUCAP in the last 72 hours.  Assessment/Plan:  -Hydropneumothorax post right lobectomy admitted with malfunction of the existing pigtail catheter.  Exchange of the catheter by interventional radiology was planned but the tube came out before this could be accomplished.  The chest x-ray has remained stable.  No plans to replace the pleural tube.    -ID- afebrile, no leukocytosis- on Ceftriaxone for suspected pneumonia.  We will  transition to oral Keflex.  -CV- hemodynamically stable, SBP in the 140s- on Toprol, Imdur  -H/O Pulmonary Fibrosis continue inhalers  -Dispo- patient stable plan discharge today on oral Keflex.  He has scheduled follow-up with Dr. Kipp Brood later this week.   LOS: 2 days    Antony Odea, PA-C 04/27/2021

## 2021-04-27 NOTE — Plan of Care (Signed)
°  Problem: Education: Goal: Knowledge of disease or condition will improve Outcome: Progressing Goal: Knowledge of the prescribed therapeutic regimen will improve Outcome: Progressing Goal: Individualized Educational Video(s) Outcome: Progressing   Problem: Activity: Goal: Ability to tolerate increased activity will improve Outcome: Progressing Goal: Will verbalize the importance of balancing activity with adequate rest periods Outcome: Progressing   Problem: Activity: Goal: Will verbalize the importance of balancing activity with adequate rest periods Outcome: Progressing   Problem: Respiratory: Goal: Ability to maintain a clear airway will improve Outcome: Progressing Goal: Levels of oxygenation will improve Outcome: Progressing Goal: Ability to maintain adequate ventilation will improve Outcome: Progressing

## 2021-04-29 ENCOUNTER — Other Ambulatory Visit: Payer: Self-pay | Admitting: Thoracic Surgery (Cardiothoracic Vascular Surgery)

## 2021-04-29 DIAGNOSIS — J90 Pleural effusion, not elsewhere classified: Secondary | ICD-10-CM

## 2021-04-29 LAB — CULTURE, RESPIRATORY W GRAM STAIN
Culture: NORMAL
Gram Stain: NONE SEEN

## 2021-04-30 ENCOUNTER — Ambulatory Visit (INDEPENDENT_AMBULATORY_CARE_PROVIDER_SITE_OTHER): Payer: Self-pay | Admitting: Thoracic Surgery (Cardiothoracic Vascular Surgery)

## 2021-04-30 ENCOUNTER — Other Ambulatory Visit: Payer: Self-pay

## 2021-04-30 ENCOUNTER — Ambulatory Visit
Admission: RE | Admit: 2021-04-30 | Discharge: 2021-04-30 | Disposition: A | Discharge status: Home / Self Care | Payer: PPO | Source: Ambulatory Visit | Attending: Thoracic Surgery (Cardiothoracic Vascular Surgery) | Admitting: Thoracic Surgery (Cardiothoracic Vascular Surgery)

## 2021-04-30 VITALS — BP 112/72 | HR 100 | Resp 20 | Ht 72.0 in | Wt 205.0 lb

## 2021-04-30 DIAGNOSIS — J90 Pleural effusion, not elsewhere classified: Secondary | ICD-10-CM

## 2021-04-30 DIAGNOSIS — Z902 Acquired absence of lung [part of]: Secondary | ICD-10-CM

## 2021-04-30 LAB — CULTURE, BLOOD (ROUTINE X 2)
Culture: NO GROWTH
Culture: NO GROWTH

## 2021-04-30 MED ORDER — FUROSEMIDE 40 MG PO TABS: 40.0000 mg | ORAL_TABLET | Freq: Every day | ORAL | 0 refills | Status: DC

## 2021-05-06 NOTE — Progress Notes (Signed)
? ?   ?  [address] ?      [place] [address] ?            [phone]       ? ?[name] ?[facility] Record #[id number] ?Date of Birth: [birth year] ? ?Referring: Marshell Garfinkel, MD ?Primary Care: Seward Carol, MD ?Primary Cardiologist:Henry Carlye Grippe, MD ? ?Reason for visit:   follow-up ? ?History of Present Illness:     ?Richard Parrish comes in for follow-up appointment.  His chest tube was removed inadvertently, but he denies any shortness of breath. ? ?Physical Exam: ?BP 112/72   Pulse 100   Resp 20   Ht 6' (1.829 m)   Wt 205 lb (93 kg)   SpO2 95% Comment: RA  BMI 27.80 kg/m?  ? ?Alert NAD ?Incision clean.   ?Abdomen, ND ?No peripheral edema ? ? ?Diagnostic Studies & Laboratory data: ?CXR: Moderate effusion, but no evidence of pneumothorax. ? ?  ? ?Assessment / Plan:   ?71 year old male with history of stage I adenocarcinoma status post right upper lobectomy as well as interstitial lung disease who had a late postoperative pneumothorax.  This has since resolved.  We will continue to follow-up with pulmonary as well as medical oncology for ongoing surveillance. ? ? ?Lucile Crater Marilyne Haseley ?05/06/2021 2:49 PM ? ? ? ? ? ? ?

## 2021-05-10 ENCOUNTER — Other Ambulatory Visit: Payer: Self-pay

## 2021-05-10 ENCOUNTER — Ambulatory Visit (INDEPENDENT_AMBULATORY_CARE_PROVIDER_SITE_OTHER): Payer: PPO | Admitting: Pulmonary Disease

## 2021-05-10 ENCOUNTER — Encounter: Payer: Self-pay | Admitting: Pulmonary Disease

## 2021-05-10 ENCOUNTER — Ambulatory Visit (INDEPENDENT_AMBULATORY_CARE_PROVIDER_SITE_OTHER): Payer: PPO

## 2021-05-10 VITALS — BP 114/62 | HR 86 | Temp 98.1°F | Ht 72.0 in | Wt 208.4 lb

## 2021-05-10 DIAGNOSIS — J849 Interstitial pulmonary disease, unspecified: Secondary | ICD-10-CM

## 2021-05-10 DIAGNOSIS — J9383 Other pneumothorax: Secondary | ICD-10-CM

## 2021-05-10 DIAGNOSIS — J189 Pneumonia, unspecified organism: Secondary | ICD-10-CM

## 2021-05-10 NOTE — Patient Instructions (Signed)
We will get a chest x-ray today to reevaluate the lung ?We will discuss your biopsy at a conference tomorrow ?I also discussed management with Dr. Kipp Brood ?Follow-up in 4 weeks. ?

## 2021-05-10 NOTE — Progress Notes (Signed)
? ?      Richard Parrish    [id number]    [birth year] ? ?Primary Care Physician:Polite, Jori Moll, MD ? ?Referring Physician: Seward Carol, MD ?[place] ?[address] ?[place],  [place] [address] ? ?Problem list: ?Follow up for interstitial lung disease ?Right upper lobe adenocarcinoma status post bronchoscopy and robotic resection by Drs Valeta Harms and Kipp Brood on 1/4 ? ?HPI: ?71 year old with history of pulmonary fibrosis, GERD, hypertension, hyperlipidemia, pulmonary fibrosis ?He was diagnosed with unspecified pulmonary fibrosis and was seen in pulmonary clinic in 2015 ? ?Developed COVID-19 in March 2021 and reports worsening dyspnea since then.  Complains of chronic dyspnea on exertion, cough.  No fevers or chills ? ?Imaging showed right upper lobe mass and he underwent combined bronchoscopy with Dr. Valeta Harms and subsequently right upper lobe robotic resection by Dr. Kipp Brood on 03/03/2021.  ?Saw Dr. Julien Nordmann from oncology on 03/24/2021 and no further chemotherapy recommended ? ?Pets: 3 cats ?Occupation: Used to work in Librarian, academic at [employer] ?Exposures: Exposed to [employer] carrier. ?Smoking history: 30-pack-year smoker.  Quit in 1989.  Currently vapes which helps with his back. ?Travel history: No significant recent travel history ?Relevant family history: No family history of lung disease ? ?Interim history: ?He continues to have recurrent issues with right lower lobe infiltrate and recurrent pneumothorax.   ?Treated in Jan with levofloxacin and prednisone. ?He had needle decompression and chest tube placed with Heimlich valve and mini VAC for pneumothorax.  Hospitalized on 2/26 under the care of cardiothoracic surgery.  Discharged on amoxicillin and additional prednisone taper. ?Started on Lasix on 3/3 by Dr. Kipp Brood for chest x-ray showing moderate right pleural effusion ? ?Outpatient Encounter Medications as of 05/10/2021  ?Medication Sig  ? acetaminophen (TYLENOL) 500 MG tablet Take 500 mg by  mouth every 6 (six) hours as needed for moderate pain.  ? ALPRAZolam (XANAX) 0.5 MG tablet Take 0.25-0.5 mg by mouth 3 (three) times daily as needed for anxiety or sleep.  ? antiseptic oral rinse (BIOTENE) LIQD 15 mLs by Mouth Rinse route as needed for dry mouth.  ? atorvastatin (LIPITOR) 80 MG tablet TAKE 1 TABLET BY MOUTH EVERY DAY (Patient taking differently: Take 80 mg by mouth daily.)  ? budesonide (ENTOCORT EC) 3 MG 24 hr capsule Take 3-6 mg by mouth See admin instructions. Takes 2 capsules (6 mg) by mouth in the morning and take 1 capsule (3 mg) by mouth at night  ? cholecalciferol (VITAMIN D3) 25 MCG (1000 UNIT) tablet Take 1,000 Units by mouth in the morning.  ? clopidogrel (PLAVIX) 75 MG tablet TAKE 1 TABLET BY MOUTH EVERY DAY (Patient taking differently: Take 75 mg by mouth daily.)  ? gabapentin (NEURONTIN) 100 MG capsule Take 2 capsules (200 mg total) by mouth 3 (three) times daily.  ? guaiFENesin (MUCINEX) 600 MG 12 hr tablet Take 1 tablet (600 mg total) by mouth 2 (two) times daily as needed.  ? HYDROcodone-acetaminophen (NORCO) 10-325 MG tablet Take 1 tablet by mouth every 4 (four) hours as needed for moderate pain or severe pain.  ? hyoscyamine (LEVSIN, ANASPAZ) 0.125 MG tablet Take 0.125 mg by mouth every 4 (four) hours as needed for cramping.   ? isosorbide mononitrate (IMDUR) 30 MG 24 hr tablet Take 1 tablet (30 mg total) by mouth daily. (Patient taking differently: Take 30 mg by mouth daily as needed (chest pain).)  ? Lidocaine 4 % PTCH Apply 1 patch topically as needed (pain).  ? metaxalone (SKELAXIN) 800 MG tablet  Take 800 mg by mouth 3 (three) times daily as needed for muscle spasms.  ? metoprolol succinate (TOPROL-XL) 50 MG 24 hr tablet TAKE 1 TABLET BY MOUTH EVERY DAY AFTER A MEAL (Patient taking differently: 50 mg daily.)  ? nitroGLYCERIN (NITROSTAT) 0.4 MG SL tablet Place 0.4 mg under the tongue every 5 (five) minutes as needed for chest pain.  ? pantoprazole (PROTONIX) 40 MG tablet TAKE 1  TABLET BY MOUTH DAILY BEFORE BREAKFAST (Patient taking differently: Take 40 mg by mouth daily before breakfast.)  ? PARoxetine (PAXIL) 40 MG tablet Take 40 mg by mouth in the morning.  ? Probiotic Product (PROBIOTIC PO) Take 1 capsule by mouth daily.  ? sodium chloride (OCEAN) 0.65 % SOLN nasal spray Place 1 spray into both nostrils as needed for congestion.  ? tamsulosin (FLOMAX) 0.4 MG CAPS capsule Take 0.4 mg by mouth 2 (two) times daily.  ? [DISCONTINUED] amoxicillin-clavulanate (AUGMENTIN) 875-125 MG tablet Take 1 tablet by mouth 2 (two) times daily.  ? [DISCONTINUED] furosemide (LASIX) 40 MG tablet Take 1 tablet (40 mg total) by mouth daily.  ? ?No facility-administered encounter medications on file as of 05/10/2021.  ? ? ?Physical Exam: ?Blood pressure 114/62, pulse 86, temperature 98.1 ?F (36.7 ?C), temperature source Oral, height 6' (1.829 m), weight 208 lb 6.4 oz (94.5 kg), SpO2 96 %. ?Gen:      No acute distress ?HEENT:  EOMI, sclera anicteric ?Neck:     No masses; no thyromegaly ?Lungs:    Right base crackles ?CV:         Regular rate and rhythm; no murmurs ?Abd:      + bowel sounds; soft, non-tender; no palpable masses, no distension ?Ext:    No edema; adequate peripheral perfusion ?Skin:      Warm and dry; no rash ?Neuro: alert and oriented x 3 ?Psych: normal mood and affect  ? ?Data Reviewed: ?Imaging: ?CT chest 07/18/2013-interstitial fibrosis with traction bronchiectasis with basilar predominance, emphysema.  Probable UIP pattern ?CT abdomen pelvis 02/07/17- basal pulmonary fibrosis. ?CT high-res 12/18/2020-new spiculated lung nodule in the right upper lobe ?PET scan 01/05/2021-uptake in the right upper lobe, equivocal uptake in the right hilum.  Left sphenoid sinus disease. ?Chest x-ray 04/01/2021-dense right lower lobe infiltrate ?CTA 04/13/2021-no PE, large right pneumothorax ?Chest x-ray 04/30/2021-progressive airspace consolidation in the right base, moderate right effusion ?I have reviewed the images  personally ? ?PFTs: ?11/27/2020 ?FVC 4.03 [85%], FEV1 2.95 [84%], F/F 73, TLC 6.09 [81%], DLCO 20.01 [73%] ?Minimal diffusion defect. ? ?Labs: ?CTD serologies 11/26/2020-P ANCA 1:80 ? ?Assessment:  ?Recurrent right pneumothorax, right lower lobe consolidation ?Had a chest tube placed.  Follow-up chest x-ray shows resolution of pneumothorax.  He does have right lower lobe consolidation and effusion for which she received levofloxacin in January and amoxicillin and additional prednisone 2 weeks ? ?We will get a repeat chest x-ray today.  I am not sure if he has ongoing infection.  His recurrent issues with infiltrate may be ILD exacerbations and I will discuss with Dr. Kipp Brood if we can put him on long-term prednisone to help with this ? ?Right upper lobe lung adenocarcinoma ?S/p bronchoscopy and lobectomy ?Needs surveillance CT scans ? 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  Has slight progression on repeat CT scan ?Elevation in ANCA is likely nonspecific as he has no signs of connective tissue disease.  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 ?Will review the lung biopsies that were  done during recent lobectomy at multidisciplinary ILD conference in tomorrow and decide on treatment.  ? ?Plan/Recommendations: ?Follow-up chest x-ray ?Discuss biopsy at multidisciplinary ILD conference ?Consider restarting steroids ? ?Marshell Garfinkel MD ? Pulmonary and Critical Care ?05/10/2021, 11:13 AM ? ?CC: Seward Carol, MD ? ?  ?

## 2021-05-11 ENCOUNTER — Encounter: Payer: Self-pay | Admitting: Pulmonary Disease

## 2021-05-11 NOTE — Telephone Encounter (Signed)
Received the following message from patient:  ? ?"Do I need more antibiotics?"  ? ?Patient was referring to his chest xray results from 05/10/21.  ? ?Dr. Vaughan Browner, can you please advise? Thanks!  ?

## 2021-05-12 ENCOUNTER — Telehealth: Payer: Self-pay | Admitting: Pulmonary Disease

## 2021-05-12 MED ORDER — ZITHROMAX Z-PAK 250 MG PO TABS: ORAL_TABLET | ORAL | 0 refills | Status: DC

## 2021-05-12 NOTE — Telephone Encounter (Signed)
I called and discussed with patient.  I have sent in a prescription for Z-Pak. ?

## 2021-05-12 NOTE — Telephone Encounter (Signed)
Dr Vaughan Browner already called and addressed issues with patient. Nothing further needed ?

## 2021-05-14 ENCOUNTER — Telehealth: Payer: Self-pay | Admitting: Pulmonary Disease

## 2021-05-14 ENCOUNTER — Other Ambulatory Visit: Payer: Self-pay

## 2021-05-14 ENCOUNTER — Ambulatory Visit (INDEPENDENT_AMBULATORY_CARE_PROVIDER_SITE_OTHER): Payer: Self-pay | Admitting: Thoracic Surgery (Cardiothoracic Vascular Surgery)

## 2021-05-14 ENCOUNTER — Ambulatory Visit (INDEPENDENT_AMBULATORY_CARE_PROVIDER_SITE_OTHER): Payer: PPO

## 2021-05-14 DIAGNOSIS — J9383 Other pneumothorax: Secondary | ICD-10-CM | POA: Diagnosis not present

## 2021-05-14 DIAGNOSIS — Z902 Acquired absence of lung [part of]: Secondary | ICD-10-CM

## 2021-05-14 DIAGNOSIS — J189 Pneumonia, unspecified organism: Secondary | ICD-10-CM

## 2021-05-14 MED ORDER — LEVOFLOXACIN 750 MG PO TABS: 750.0000 mg | ORAL_TABLET | Freq: Every day | ORAL | 0 refills | Status: DC

## 2021-05-14 MED ORDER — PREDNISONE 20 MG PO TABS: 40.0000 mg | ORAL_TABLET | Freq: Every day | ORAL | 0 refills | Status: DC

## 2021-05-14 NOTE — Progress Notes (Signed)
?   ?  [address] ?      [place] [address] ?            [phone]      ? ?Patient: Home ?Provider: Office ?Consent for Telemedicine visit obtained. ? ?Today?s visit was completed via a real-time telehealth (see specific modality noted below). The patient/authorized person provided oral consent at the time of the visit to engage in a telemedicine encounter with the present provider at [facility]. The patient/authorized person was informed of the potential benefits, limitations, and risks of telemedicine. The patient/authorized person expressed understanding that the laws that protect confidentiality also apply to telemedicine. The patient/authorized person acknowledged understanding that telemedicine does not provide emergency services and that he or she would need to call 911 or proceed to the nearest hospital for help if such a need arose. ? ? Total time spent in the clinical discussion 10 minutes. ? Telehealth Modality: Phone visit (audio only) ? ?I had a telephone visit with [name].  His CXR shows no pneumothorax.  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  He breathing is much diminished.  He will continue to follow up with his pulmonologist. ? ?Lajuana Matte ? ? ?

## 2021-05-14 NOTE — Telephone Encounter (Signed)
He will need a chest x ray today since he has issues with recurrent pneumothorax ?Continue azithromycin.  Call in prednisone 40 mg a day to his pharmacy. ? ?If his breathing worsens then he may need to go to the emergency room ?

## 2021-05-14 NOTE — Telephone Encounter (Signed)
Called and spoke with patient's wife. She stated that the patient was seen by Dr. Vaughan Browner on 05/10/2021 and had no symptoms at the time of his visit. Yesterday morning, he started complaining of increased SOB. As the day went on, the SOB increased. He is now at the point where he is gasping for air with just moving around the house from room to room.  ? ?She denied any fevers or chills. He does have a cough but it has been non-productive so far. He is still taking the azithromycin that was prescribed on 05/12/2021. He does not have any O2 at home.  ? ?I attempted to find an appointment for him today but I could not find any openings.  ? ?Pharmacy is [facility] in [place].  ? ?Dr. Vaughan Browner, can you please advise? Thanks!  ?

## 2021-05-14 NOTE — Telephone Encounter (Signed)
Called and spoke with patient's wife. She verbalized understanding of recommendations. STAT CXR has been ordered and prednisone sent to his pharmacy.  ? ?Nothing further needed at time of call.  ?

## 2021-05-14 NOTE — Telephone Encounter (Signed)
Chest x-ray today reviewed with worsening infiltrates on the right greater than left ?He is already on a Z-Pak but it is not helping.  I will call in a prescription for levofloxacin. ?He has started prednisone 40 mg a day she will continue ? ?I am starting to worry that this may be an ILD exacerbation which is difficult to treat.  I shared this concern with Richard Parrish today.  If he worsens then he will seek care in the emergency room ? ? ? ?

## 2021-05-15 ENCOUNTER — Other Ambulatory Visit: Payer: Self-pay | Admitting: Pulmonary Disease

## 2021-05-15 NOTE — Progress Notes (Signed)
Spouse called answering service about medication not being called in ? ?Reviewed records, prescription for Levaquin was sent to [facility] ?

## 2021-05-17 ENCOUNTER — Other Ambulatory Visit: Payer: Self-pay | Admitting: Pulmonary Disease

## 2021-05-17 ENCOUNTER — Other Ambulatory Visit: Payer: Self-pay | Admitting: Physician Assistant

## 2021-05-18 ENCOUNTER — Emergency Department (HOSPITAL_COMMUNITY): Payer: PPO

## 2021-05-18 ENCOUNTER — Inpatient Hospital Stay (HOSPITAL_COMMUNITY): Payer: PPO

## 2021-05-18 ENCOUNTER — Telehealth: Payer: Self-pay | Admitting: Pulmonary Disease

## 2021-05-18 ENCOUNTER — Other Ambulatory Visit: Payer: Self-pay

## 2021-05-18 ENCOUNTER — Inpatient Hospital Stay (HOSPITAL_COMMUNITY)
Admission: EM | Admit: 2021-05-18 | Discharge: 2021-05-26 | DRG: 175 | Disposition: A | Discharge status: Skilled Nursing Facility | Payer: PPO | Attending: Family Medicine | Admitting: Family Medicine

## 2021-05-18 DIAGNOSIS — D72829 Elevated white blood cell count, unspecified: Secondary | ICD-10-CM | POA: Diagnosis present

## 2021-05-18 DIAGNOSIS — N400 Enlarged prostate without lower urinary tract symptoms: Secondary | ICD-10-CM | POA: Diagnosis present

## 2021-05-18 DIAGNOSIS — I2510 Atherosclerotic heart disease of native coronary artery without angina pectoris: Secondary | ICD-10-CM | POA: Diagnosis present

## 2021-05-18 DIAGNOSIS — I25110 Atherosclerotic heart disease of native coronary artery with unstable angina pectoris: Secondary | ICD-10-CM | POA: Diagnosis not present

## 2021-05-18 DIAGNOSIS — E785 Hyperlipidemia, unspecified: Secondary | ICD-10-CM | POA: Diagnosis present

## 2021-05-18 DIAGNOSIS — Z87891 Personal history of nicotine dependence: Secondary | ICD-10-CM

## 2021-05-18 DIAGNOSIS — F419 Anxiety disorder, unspecified: Secondary | ICD-10-CM | POA: Diagnosis present

## 2021-05-18 DIAGNOSIS — Z955 Presence of coronary angioplasty implant and graft: Secondary | ICD-10-CM

## 2021-05-18 DIAGNOSIS — J8410 Pulmonary fibrosis, unspecified: Secondary | ICD-10-CM

## 2021-05-18 DIAGNOSIS — Z85118 Personal history of other malignant neoplasm of bronchus and lung: Secondary | ICD-10-CM

## 2021-05-18 DIAGNOSIS — J439 Emphysema, unspecified: Secondary | ICD-10-CM

## 2021-05-18 DIAGNOSIS — Z79899 Other long term (current) drug therapy: Secondary | ICD-10-CM

## 2021-05-18 DIAGNOSIS — I82452 Acute embolism and thrombosis of left peroneal vein: Secondary | ICD-10-CM | POA: Diagnosis present

## 2021-05-18 DIAGNOSIS — I2693 Single subsegmental pulmonary embolism without acute cor pulmonale: Secondary | ICD-10-CM | POA: Diagnosis not present

## 2021-05-18 DIAGNOSIS — J700 Acute pulmonary manifestations due to radiation: Secondary | ICD-10-CM | POA: Diagnosis present

## 2021-05-18 DIAGNOSIS — I2694 Multiple subsegmental pulmonary emboli without acute cor pulmonale: Principal | ICD-10-CM | POA: Diagnosis present

## 2021-05-18 DIAGNOSIS — J9621 Acute and chronic respiratory failure with hypoxia: Secondary | ICD-10-CM | POA: Diagnosis present

## 2021-05-18 DIAGNOSIS — I2699 Other pulmonary embolism without acute cor pulmonale: Secondary | ICD-10-CM

## 2021-05-18 DIAGNOSIS — G8929 Other chronic pain: Secondary | ICD-10-CM | POA: Diagnosis present

## 2021-05-18 DIAGNOSIS — J9601 Acute respiratory failure with hypoxia: Secondary | ICD-10-CM

## 2021-05-18 DIAGNOSIS — I252 Old myocardial infarction: Secondary | ICD-10-CM | POA: Diagnosis not present

## 2021-05-18 DIAGNOSIS — J84112 Idiopathic pulmonary fibrosis: Secondary | ICD-10-CM | POA: Diagnosis present

## 2021-05-18 DIAGNOSIS — Z8701 Personal history of pneumonia (recurrent): Secondary | ICD-10-CM

## 2021-05-18 DIAGNOSIS — I272 Pulmonary hypertension, unspecified: Secondary | ICD-10-CM | POA: Diagnosis present

## 2021-05-18 DIAGNOSIS — I951 Orthostatic hypotension: Secondary | ICD-10-CM | POA: Diagnosis present

## 2021-05-18 DIAGNOSIS — Z7902 Long term (current) use of antithrombotics/antiplatelets: Secondary | ICD-10-CM

## 2021-05-18 DIAGNOSIS — Z96643 Presence of artificial hip joint, bilateral: Secondary | ICD-10-CM | POA: Diagnosis present

## 2021-05-18 DIAGNOSIS — I739 Peripheral vascular disease, unspecified: Secondary | ICD-10-CM | POA: Diagnosis present

## 2021-05-18 DIAGNOSIS — M545 Low back pain, unspecified: Secondary | ICD-10-CM | POA: Diagnosis present

## 2021-05-18 DIAGNOSIS — Z86711 Personal history of pulmonary embolism: Secondary | ICD-10-CM

## 2021-05-18 DIAGNOSIS — Z20822 Contact with and (suspected) exposure to covid-19: Secondary | ICD-10-CM | POA: Diagnosis present

## 2021-05-18 DIAGNOSIS — C3411 Malignant neoplasm of upper lobe, right bronchus or lung: Secondary | ICD-10-CM | POA: Diagnosis present

## 2021-05-18 DIAGNOSIS — Z7952 Long term (current) use of systemic steroids: Secondary | ICD-10-CM

## 2021-05-18 DIAGNOSIS — G4733 Obstructive sleep apnea (adult) (pediatric): Secondary | ICD-10-CM | POA: Diagnosis present

## 2021-05-18 DIAGNOSIS — Z981 Arthrodesis status: Secondary | ICD-10-CM | POA: Diagnosis not present

## 2021-05-18 DIAGNOSIS — J849 Interstitial pulmonary disease, unspecified: Secondary | ICD-10-CM

## 2021-05-18 DIAGNOSIS — I10 Essential (primary) hypertension: Secondary | ICD-10-CM | POA: Diagnosis present

## 2021-05-18 DIAGNOSIS — I3139 Other pericardial effusion (noninflammatory): Secondary | ICD-10-CM | POA: Diagnosis not present

## 2021-05-18 DIAGNOSIS — F32A Depression, unspecified: Secondary | ICD-10-CM | POA: Diagnosis present

## 2021-05-18 DIAGNOSIS — E869 Volume depletion, unspecified: Secondary | ICD-10-CM | POA: Diagnosis present

## 2021-05-18 DIAGNOSIS — Z902 Acquired absence of lung [part of]: Secondary | ICD-10-CM

## 2021-05-18 DIAGNOSIS — Z888 Allergy status to other drugs, medicaments and biological substances status: Secondary | ICD-10-CM

## 2021-05-18 DIAGNOSIS — T380X5A Adverse effect of glucocorticoids and synthetic analogues, initial encounter: Secondary | ICD-10-CM | POA: Diagnosis present

## 2021-05-18 DIAGNOSIS — K219 Gastro-esophageal reflux disease without esophagitis: Secondary | ICD-10-CM | POA: Diagnosis present

## 2021-05-18 DIAGNOSIS — Z951 Presence of aortocoronary bypass graft: Secondary | ICD-10-CM

## 2021-05-18 DIAGNOSIS — Z7901 Long term (current) use of anticoagulants: Secondary | ICD-10-CM

## 2021-05-18 HISTORY — DX: Other pulmonary embolism without acute cor pulmonale: I26.99

## 2021-05-18 LAB — COMPREHENSIVE METABOLIC PANEL
ALT: 29 U/L (ref 0–44)
AST: 33 U/L (ref 15–41)
Albumin: 3.1 g/dL — ABNORMAL LOW (ref 3.5–5.0)
Alkaline Phosphatase: 95 U/L (ref 38–126)
Anion gap: 12 (ref 5–15)
BUN: 18 mg/dL (ref 8–23)
CO2: 22 mmol/L (ref 22–32)
Calcium: 9.2 mg/dL (ref 8.9–10.3)
Chloride: 102 mmol/L (ref 98–111)
Creatinine, Ser: 0.97 mg/dL (ref 0.61–1.24)
GFR, Estimated: >60 mL/min (ref >60)
Glucose, Bld: 141 mg/dL — ABNORMAL HIGH (ref 70–99)
Potassium: 3.9 mmol/L (ref 3.5–5.1)
Sodium: 136 mmol/L (ref 135–145)
Total Bilirubin: 1 mg/dL (ref 0.3–1.2)
Total Protein: 6.8 g/dL (ref 6.5–8.1)

## 2021-05-18 LAB — CBC WITH DIFFERENTIAL/PLATELET
Abs Immature Granulocytes: 0.19 10*3/uL — ABNORMAL HIGH (ref 0.00–0.07)
Basophils Absolute: 0.1 10*3/uL (ref 0.0–0.1)
Basophils Relative: 0 %
Eosinophils Absolute: 0 10*3/uL (ref 0.0–0.5)
Eosinophils Relative: 0 %
HCT: 42.5 % (ref 39.0–52.0)
Hemoglobin: 14.5 g/dL (ref 13.0–17.0)
Immature Granulocytes: 1 %
Lymphocytes Relative: 5 %
Lymphs Abs: 0.7 10*3/uL (ref 0.7–4.0)
MCH: 32.1 pg (ref 26.0–34.0)
MCHC: 34.1 g/dL (ref 30.0–36.0)
MCV: 94 fL (ref 80.0–100.0)
Monocytes Absolute: 0.6 10*3/uL (ref 0.1–1.0)
Monocytes Relative: 4 %
Neutro Abs: 13.7 10*3/uL — ABNORMAL HIGH (ref 1.7–7.7)
Neutrophils Relative %: 90 %
Platelets: 579 10*3/uL — ABNORMAL HIGH (ref 150–400)
RBC: 4.52 MIL/uL (ref 4.22–5.81)
RDW: 14.4 % (ref 11.5–15.5)
WBC: 15.3 10*3/uL — ABNORMAL HIGH (ref 4.0–10.5)
nRBC: 0 % (ref 0.0–0.2)

## 2021-05-18 LAB — BRAIN NATRIURETIC PEPTIDE: B Natriuretic Peptide: 179.1 pg/mL — ABNORMAL HIGH (ref 0.0–100.0)

## 2021-05-18 LAB — I-STAT CHEM 8, ED
BUN: 21 mg/dL (ref 8–23)
Calcium, Ion: 1.16 mmol/L (ref 1.15–1.40)
Chloride: 103 mmol/L (ref 98–111)
Creatinine, Ser: 0.8 mg/dL (ref 0.61–1.24)
Glucose, Bld: 139 mg/dL — ABNORMAL HIGH (ref 70–99)
HCT: 44 % (ref 39.0–52.0)
Hemoglobin: 15 g/dL (ref 13.0–17.0)
Potassium: 4 mmol/L (ref 3.5–5.1)
Sodium: 137 mmol/L (ref 135–145)
TCO2: 25 mmol/L (ref 22–32)

## 2021-05-18 LAB — RESP PANEL BY RT-PCR (FLU A&B, COVID) ARPGX2
Influenza A by PCR: NEGATIVE
Influenza B by PCR: NEGATIVE
SARS Coronavirus 2 by RT PCR: NEGATIVE

## 2021-05-18 LAB — LACTIC ACID, PLASMA
Lactic Acid, Venous: 1.5 mmol/L (ref 0.5–1.9)
Lactic Acid, Venous: 1.8 mmol/L (ref 0.5–1.9)

## 2021-05-18 LAB — TROPONIN I (HIGH SENSITIVITY)
Troponin I (High Sensitivity): 13 ng/L (ref <18)
Troponin I (High Sensitivity): 10 ng/L (ref <18)

## 2021-05-18 LAB — MAGNESIUM: Magnesium: 2 mg/dL (ref 1.7–2.4)

## 2021-05-18 MED ORDER — HYOSCYAMINE SULFATE 0.125 MG PO TABS: 0.1250 mg | ORAL_TABLET | ORAL | Status: DC | PRN

## 2021-05-18 MED ORDER — BUDESONIDE 3 MG PO CPEP
3.0000 mg | ORAL_CAPSULE | Freq: Every day | ORAL | Status: DC
  Filled 2021-05-18: qty 1

## 2021-05-18 MED ORDER — HYDROCODONE-ACETAMINOPHEN 10-325 MG PO TABS
1.0000 | ORAL_TABLET | ORAL | Status: DC | PRN
  Administered 2021-05-18 – 2021-05-26 (×28): 1 via ORAL
  Filled 2021-05-18 (×28): qty 1

## 2021-05-18 MED ORDER — GABAPENTIN 100 MG PO CAPS
200.0000 mg | ORAL_CAPSULE | Freq: Three times a day (TID) | ORAL | Status: DC
  Administered 2021-05-19 – 2021-05-26 (×23): 200 mg via ORAL
  Filled 2021-05-18 (×24): qty 2

## 2021-05-18 MED ORDER — GUAIFENESIN ER 600 MG PO TB12
600.0000 mg | ORAL_TABLET | Freq: Two times a day (BID) | ORAL | Status: DC | PRN
  Administered 2021-05-18 – 2021-05-25 (×8): 600 mg via ORAL
  Filled 2021-05-18 (×8): qty 1

## 2021-05-18 MED ORDER — SODIUM CHLORIDE 0.9 % IV SOLN: 250.0000 mL | INTRAVENOUS | Status: DC | PRN

## 2021-05-18 MED ORDER — BUDESONIDE 3 MG PO CPEP
6.0000 mg | ORAL_CAPSULE | Freq: Every day | ORAL | Status: DC
  Administered 2021-05-19: 6 mg via ORAL
  Filled 2021-05-18: qty 2

## 2021-05-18 MED ORDER — LEVOFLOXACIN 750 MG PO TABS
750.0000 mg | ORAL_TABLET | Freq: Every day | ORAL | Status: AC
  Administered 2021-05-19 – 2021-05-20 (×2): 750 mg via ORAL
  Filled 2021-05-18 (×2): qty 1

## 2021-05-18 MED ORDER — SODIUM CHLORIDE 0.9% FLUSH
3.0000 mL | Freq: Two times a day (BID) | INTRAVENOUS | Status: DC
  Administered 2021-05-19 – 2021-05-26 (×13): 3 mL via INTRAVENOUS

## 2021-05-18 MED ORDER — PANTOPRAZOLE SODIUM 40 MG PO TBEC
40.0000 mg | DELAYED_RELEASE_TABLET | Freq: Every day | ORAL | Status: DC
  Administered 2021-05-19 – 2021-05-26 (×8): 40 mg via ORAL
  Filled 2021-05-18 (×8): qty 1

## 2021-05-18 MED ORDER — ALPRAZOLAM 0.25 MG PO TABS
0.2500 mg | ORAL_TABLET | Freq: Three times a day (TID) | ORAL | Status: DC | PRN
  Administered 2021-05-18 – 2021-05-19 (×3): 0.5 mg via ORAL
  Administered 2021-05-20: 0.25 mg via ORAL
  Administered 2021-05-20 – 2021-05-25 (×11): 0.5 mg via ORAL
  Filled 2021-05-18 (×14): qty 2
  Filled 2021-05-18: qty 1
  Filled 2021-05-18: qty 2

## 2021-05-18 MED ORDER — PAROXETINE HCL 20 MG PO TABS
40.0000 mg | ORAL_TABLET | Freq: Every day | ORAL | Status: DC
  Administered 2021-05-19 – 2021-05-26 (×8): 40 mg via ORAL
  Filled 2021-05-18 (×8): qty 2

## 2021-05-18 MED ORDER — HYOSCYAMINE SULFATE 0.125 MG SL SUBL
0.1250 mg | SUBLINGUAL_TABLET | SUBLINGUAL | Status: DC | PRN
  Filled 2021-05-18: qty 1

## 2021-05-18 MED ORDER — TAMSULOSIN HCL 0.4 MG PO CAPS
0.4000 mg | ORAL_CAPSULE | Freq: Every day | ORAL | Status: DC
  Administered 2021-05-19 – 2021-05-26 (×8): 0.4 mg via ORAL
  Filled 2021-05-18 (×8): qty 1

## 2021-05-18 MED ORDER — HEPARIN BOLUS VIA INFUSION
6000.0000 [IU] | Freq: Once | INTRAVENOUS | Status: AC
  Administered 2021-05-18: 6000 [IU] via INTRAVENOUS
  Filled 2021-05-18: qty 6000

## 2021-05-18 MED ORDER — IOHEXOL 350 MG/ML SOLN
50.0000 mL | Freq: Once | INTRAVENOUS | Status: AC | PRN
Start: 2021-05-18 — End: 2021-05-18
  Administered 2021-05-18: 50 mL via INTRAVENOUS

## 2021-05-18 MED ORDER — ACETAMINOPHEN 325 MG PO TABS: 650.0000 mg | ORAL_TABLET | Freq: Four times a day (QID) | ORAL | Status: DC | PRN

## 2021-05-18 MED ORDER — VITAMIN D 25 MCG (1000 UNIT) PO TABS
1000.0000 [IU] | ORAL_TABLET | Freq: Every day | ORAL | Status: DC
  Administered 2021-05-19 – 2021-05-26 (×8): 1000 [IU] via ORAL
  Filled 2021-05-18 (×8): qty 1

## 2021-05-18 MED ORDER — VITAMIN D 25 MCG (1000 UNIT) PO TABS: 1000.0000 [IU] | ORAL_TABLET | Freq: Every day | ORAL | Status: DC

## 2021-05-18 MED ORDER — SODIUM CHLORIDE 0.9% FLUSH: 3.0000 mL | INTRAVENOUS | Status: DC | PRN

## 2021-05-18 MED ORDER — BUDESONIDE 3 MG PO CPEP: 3.0000 mg | ORAL_CAPSULE | ORAL | Status: DC

## 2021-05-18 MED ORDER — HEPARIN (PORCINE) 25000 UT/250ML-% IV SOLN
1400.0000 [IU]/h | INTRAVENOUS | Status: DC
  Administered 2021-05-18: 1600 [IU]/h via INTRAVENOUS
  Filled 2021-05-18 (×2): qty 250

## 2021-05-18 MED ORDER — CLOPIDOGREL BISULFATE 75 MG PO TABS
75.0000 mg | ORAL_TABLET | Freq: Every day | ORAL | Status: DC
  Administered 2021-05-19 – 2021-05-26 (×8): 75 mg via ORAL
  Filled 2021-05-18 (×8): qty 1

## 2021-05-18 MED ORDER — ACETAMINOPHEN 650 MG RE SUPP: 650.0000 mg | Freq: Four times a day (QID) | RECTAL | Status: DC | PRN

## 2021-05-18 MED ORDER — ATORVASTATIN CALCIUM 80 MG PO TABS
80.0000 mg | ORAL_TABLET | Freq: Every day | ORAL | Status: DC
  Administered 2021-05-19 – 2021-05-26 (×8): 80 mg via ORAL
  Filled 2021-05-18 (×8): qty 1

## 2021-05-18 NOTE — Progress Notes (Signed)
BLE venous duplex has been completed.  Preliminary findings messaged to Dr. Rogers Blocker via secure messenger. ? ?Results can be found under chart review under [facility] PROC. ?05/18/2021 5:54 PM ?Mata Rowen RVT, RDMS ? ?

## 2021-05-18 NOTE — Progress Notes (Deleted)
In ER with PE--> Admitted ?

## 2021-05-18 NOTE — Telephone Encounter (Signed)
Lm for patient's spouse, Ferne(DPR) ?

## 2021-05-18 NOTE — Progress Notes (Signed)
Pt arrived from ED, VSS, CHG complete, oriented to unit, call light within reach, tele started.  ? ?Alvis Lemmings, RN ?05/18/2021 ? ?

## 2021-05-18 NOTE — Telephone Encounter (Signed)
Lm for Ferne(DPR) ?

## 2021-05-18 NOTE — ED Provider Notes (Signed)
[facility] EMERGENCY DEPARTMENT Provider Note   CSN: [id number] Arrival date & time: 05/18/21  1339     History  Chief Complaint  Patient presents with   Shortness of Breath   Nausea    Richard Parrish is a 71 y.o. male.   Shortness of Breath Patient presents for shortness of breath.  This has been worsening over the past week.  It is worse with exertion.  It has progressed to the point of complete exercise intolerance.  His history includes lung cancer s/p right upper lobectomy and right lower lobe wedge resection 2 months ago, PVD, HTN, HLD, CAD s/p CABG, pulmonary fibrosis, chronic pain, anxiety, depression.  He reports that his lobectomy surgeries were curative and he is not on any chemotherapy.  He did have postoperative complications of pneumothorax and pleural effusions.  He currently denies any areas of chest discomfort.  He has not had any recent nausea, vomiting, or diarrhea.  He was feeling well up until 1 week ago.  He is not on supplemental oxygen at home.  He is on Plavix.  He is not on anticoagulation.    Home Medications Prior to Admission medications   Medication Sig Start Date End Date Taking? Authorizing Provider  acetaminophen (TYLENOL) 500 MG tablet Take 500 mg by mouth every 6 (six) hours as needed for moderate pain.   Yes [provider]  ALPRAZolam (XANAX) 0.5 MG tablet Take 0.25-0.5 mg by mouth 3 (three) times daily as needed for anxiety or sleep. 01/31/21  Yes [provider]  antiseptic oral rinse (BIOTENE) LIQD 15 mLs by Mouth Rinse route daily as needed for dry mouth.   Yes [provider]  atorvastatin (LIPITOR) 80 MG tablet TAKE 1 TABLET BY MOUTH EVERY DAY Patient taking differently: Take 80 mg by mouth daily. 02/11/21  Yes Lyn Records, MD  budesonide (ENTOCORT EC) 3 MG 24 hr capsule Take 3-6 mg by mouth See admin instructions. Takes 2 capsules (6 mg) by mouth in the morning and take 1 capsule (3 mg) by  mouth at night   Yes [provider]  cholecalciferol (VITAMIN D3) 25 MCG (1000 UNIT) tablet Take 1,000 Units by mouth in the morning.   Yes [provider]  clopidogrel (PLAVIX) 75 MG tablet TAKE 1 TABLET BY MOUTH EVERY DAY Patient taking differently: Take 75 mg by mouth daily. 08/27/20  Yes Maeola Harman, MD  gabapentin (NEURONTIN) 100 MG capsule Take 2 capsules (200 mg total) by mouth 3 (three) times daily. 03/07/21  Yes Doree Fudge M, PA-C  guaiFENesin (MUCINEX) 600 MG 12 hr tablet Take 1 tablet (600 mg total) by mouth 2 (two) times daily as needed. Patient taking differently: Take 600 mg by mouth 2 (two) times daily as needed for cough or to loosen phlegm. 04/27/21  Yes Roddenberry, Cecille [place], PA-C  HYDROcodone-acetaminophen (NORCO) 10-325 MG tablet Take 1 tablet by mouth every 4 (four) hours as needed for moderate pain or severe pain. 04/10/21  Yes [provider]  hyoscyamine (LEVSIN, ANASPAZ) 0.125 MG tablet Take 0.125 mg by mouth every 4 (four) hours as needed for cramping.    Yes [provider]  isosorbide mononitrate (IMDUR) 30 MG 24 hr tablet Take 1 tablet (30 mg total) by mouth daily. Patient taking differently: Take 30 mg by mouth daily as needed (chest pain). 10/05/20 10/05/21 Yes Bhagat, Bhavinkumar, PA  levofloxacin (LEVAQUIN) 750 MG tablet Take 1 tablet (750 mg total) by mouth daily. 05/14/21  Yes Mannam, Praveen, MD  metaxalone (SKELAXIN) 800 MG tablet Take 800 mg by mouth 3 (three) times daily as needed for muscle spasms.   Yes [provider]  metoprolol succinate (TOPROL-XL) 50 MG 24 hr tablet TAKE 1 TABLET BY MOUTH EVERY DAY AFTER A MEAL Patient taking differently: 25 mg daily. 02/15/21  Yes Lyn Records, MD  nitroGLYCERIN (NITROSTAT) 0.4 MG SL tablet Place 0.4 mg under the tongue every 5 (five) minutes as needed for chest pain.   Yes [provider]  pantoprazole (PROTONIX) 40 MG tablet TAKE 1 TABLET BY MOUTH  DAILY BEFORE BREAKFAST Patient taking differently: Take 40 mg by mouth daily before breakfast. 01/01/21  Yes Lyn Records, MD  PARoxetine (PAXIL) 40 MG tablet Take 40 mg by mouth in the morning. 01/05/15  Yes [provider]  predniSONE (DELTASONE) 20 MG tablet Take 2 tablets (40 mg total) by mouth daily with breakfast. 05/14/21  Yes Mannam, Praveen, MD  Probiotic Product (PROBIOTIC PO) Take 1 capsule by mouth daily.   Yes [provider]  sodium chloride (OCEAN) 0.65 % SOLN nasal spray Place 1 spray into both nostrils daily as needed for congestion.   Yes [provider]  tamsulosin (FLOMAX) 0.4 MG CAPS capsule Take 0.4 mg by mouth 2 (two) times daily. 08/18/14  Yes [provider]  ZITHROMAX Z-PAK 250 MG tablet Take 2 tablets on day 1 and then 1 tablet every day for the next 4 days Patient not taking: Reported on 05/18/2021 05/12/21   Chilton Greathouse, MD      Allergies    Ambien [zolpidem tartrate]    Review of Systems   Review of Systems  Constitutional:  Positive for fatigue.  Respiratory:  Positive for shortness of breath.   All other systems reviewed and are negative.  Physical Exam Updated Vital Signs BP 100/82 (BP Location: Left Arm)   Pulse 74   Temp 97.9 F (36.6 C) (Oral)   Resp 20   Ht 6' (1.829 m)   Wt 94.5 kg   SpO2 98%   BMI 28.26 kg/m  Physical Exam Vitals and nursing note reviewed.  Constitutional:      General: He is not in acute distress.    Appearance: He is well-developed and normal weight. He is ill-appearing. He is not toxic-appearing or diaphoretic.  HENT:     Head: Normocephalic and atraumatic.     Mouth/Throat:     Mouth: Mucous membranes are moist.     Pharynx: Oropharynx is clear.  Eyes:     Extraocular Movements: Extraocular movements intact.     Conjunctiva/sclera: Conjunctivae normal.  Cardiovascular:     Rate and Rhythm: Normal rate and regular rhythm.     Heart sounds: No murmur heard. Pulmonary:      Effort: Tachypnea, accessory muscle usage and respiratory distress present.     Breath sounds: Normal breath sounds. No wheezing, rhonchi or rales.  Chest:     Chest wall: No tenderness, crepitus or edema.  Abdominal:     Palpations: Abdomen is soft.     Tenderness: There is no abdominal tenderness.  Musculoskeletal:        General: No swelling.     Cervical back: Normal range of motion and neck supple.     Right lower leg: No edema.     Left lower leg: No edema.  Skin:    General: Skin is warm and dry.     Capillary Refill: Capillary refill takes less  than 2 seconds.     Coloration: Skin is pale. Skin is not cyanotic.  Neurological:     General: No focal deficit present.     Mental Status: He is alert and oriented to person, place, and time.     Cranial Nerves: No cranial nerve deficit.     Motor: No weakness.  Psychiatric:        Mood and Affect: Mood normal.        Behavior: Behavior normal.    ED Results / Procedures / Treatments   Labs (all labs ordered are listed, but only abnormal results are displayed) Labs Reviewed  CBC WITH DIFFERENTIAL/PLATELET - Abnormal; Notable for the following components:      Result Value   WBC 15.3 (*)    Platelets 579 (*)    Neutro Abs 13.7 (*)    Abs Immature Granulocytes 0.19 (*)    All other components within normal limits  COMPREHENSIVE METABOLIC PANEL - Abnormal; Notable for the following components:   Glucose, Bld 141 (*)    Albumin 3.1 (*)    All other components within normal limits  BRAIN NATRIURETIC PEPTIDE - Abnormal; Notable for the following components:   B Natriuretic Peptide 179.1 (*)    All other components within normal limits  HEPARIN LEVEL (UNFRACTIONATED) - Abnormal; Notable for the following components:   Heparin Unfractionated 0.90 (*)    All other components within normal limits  CBC - Abnormal; Notable for the following components:   WBC 12.0 (*)    Platelets 413 (*)    All other components within normal  limits  BASIC METABOLIC PANEL - Abnormal; Notable for the following components:   Glucose, Bld 119 (*)    All other components within normal limits  I-STAT CHEM 8, ED - Abnormal; Notable for the following components:   Glucose, Bld 139 (*)    All other components within normal limits  CULTURE, BLOOD (ROUTINE X 2)  CULTURE, BLOOD (ROUTINE X 2)  RESP PANEL BY RT-PCR (FLU A&B, COVID) ARPGX2  MAGNESIUM  LACTIC ACID, PLASMA  LACTIC ACID, PLASMA  PROCALCITONIN  HEPARIN LEVEL (UNFRACTIONATED)  I-STAT ARTERIAL BLOOD GAS, ED  TROPONIN I (HIGH SENSITIVITY)  TROPONIN I (HIGH SENSITIVITY)    EKG EKG Interpretation  Date/Time:  Tuesday May 18 2021 13:51:58 EDT Ventricular Rate:  99 PR Interval:  152 QRS Duration: 74 QT Interval:  362 QTC Calculation: 464 R Axis:   38 Text Interpretation: Normal sinus rhythm Nonspecific ST abnormality Abnormal ECG Confirmed by Gloris Manchester ([id number]) on 05/18/2021 2:40:30 PM  Radiology CT Angio Chest PE W and/or Wo Contrast  Result Date: 05/18/2021 CLINICAL DATA:  Shortness of breath. Question pulmonary embolism. History of right upper lobe non-small-cell lung cancer with right upper lobectomy in January. 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 * Tracking Code: BO * EXAM: CT ANGIOGRAPHY CHEST WITH CONTRAST TECHNIQUE: Multidetector CT imaging of the chest was performed using the standard protocol during bolus administration of intravenous contrast. Multiplanar CT image reconstructions and MIPs were obtained to evaluate the vascular anatomy. RADIATION DOSE REDUCTION: This exam was performed according to the departmental dose-optimization program which includes automated exposure control, adjustment of the mA and/or kV according to patient size and/or use of iterative reconstruction technique. CONTRAST:  50mL OMNIPAQUE IOHEXOL 350 MG/ML SOLN COMPARISON:  Multiple chest radiographs, including earlier today. CTA chest 04/13/2021. FINDINGS:  Cardiovascular: The quality of this exam for evaluation of pulmonary embolism is moderate. The bolus is relatively well timed. Filling  defects within the left upper lobe pulmonary arterial tree including on images 47 through 60 of series 5. Aortic atherosclerosis. Moderate cardiomegaly with native coronary artery atherosclerosis in the setting of prior CABG. The RV/LV ratio is 3.5/3.9. Mediastinum/Nodes: No supraclavicular adenopathy. Mild adenopathy within the azygoesophageal recess is similar at 1.1 cm. Right hilar adenopathy at 1.6 cm on 56/5 is grossly similar to on the prior. Lungs/Pleura: Small right pleural effusion is slightly increased compared to the prior CT with mild loculation anteriorly. Trace left pleural fluid or thickening, slightly increased. Right upper lobectomy. There is significantly right greater than left but relatively diffuse airspace and ground-glass opacity. Relative sparing of the left apex. Primarily in the right lung base, areas of traction bronchiectasis and architectural distortion. This is progressive compared to 04/13/2021. The right-sided pneumothorax has resolved. Upper Abdomen: Cholecystectomy. Normal imaged portions of the spleen, stomach, pancreas, adrenal glands, kidneys. Musculoskeletal: Bilateral rib deformities. Posttraumatic on the left and posttraumatic or postsurgical in the right. Review of the MIP images confirms the above findings. IMPRESSION: 1. Moderate volume left-sided pulmonary emboli. Positive for acute PE with borderline CT evidence of right heart strain (RV/LV Ratio = 0.9) consistent with at least submassive (intermediate risk) PE. The presence of right heart strain has been associated with an increased risk of morbidity and mortality. Please refer to the "PE Focused" order set in EPIC. 2. Worsened aeration since 04/13/2021. Right greater than left airspace and ground-glass opacity with architectural distortion, primarily on the right. Primarily felt to be  related to the patient's clinical history of post inflammatory fibrosis, including chronic drug toxicity. Concurrent pneumonia is difficult to exclude. 3. Small loculated right-sided pleural effusion and trace left pleural fluid. 4. Mild thoracic adenopathy which could be reactive or represent metastatic disease. Recommend attention on follow-up. 5. Aortic atherosclerosis (ICD10-I70.0) and emphysema (ICD10-J43.9). These results were called by telephone at the time of interpretation on 05/18/2021 at 4:02 pm to provider Dr. Particia Nearing, Who verbally acknowledged these results. Electronically Signed   By: Jeronimo Greaves M.D.   On: 05/18/2021 16:03   DG Chest Portable 1 View  Result Date: 05/18/2021 CLINICAL DATA:  Shortness of breath EXAM: PORTABLE CHEST 1 VIEW COMPARISON:  05/14/2021. Multiple previous films as distant as 04/01/2021 FINDINGS: Artifact overlies the chest. Chronic pulmonary scarring, more extensive in the right lung than the left. Density appears above baseline in there may be superimposed pneumonia or edema. IMPRESSION: Background pattern of chronic pulmonary scarring. Density appears above baseline and there could be superimposed pneumonia or edema. Electronically Signed   By: Paulina Fusi M.D.   On: 05/18/2021 15:19   VAS [place] LOWER EXTREMITY VENOUS (DVT)  Result Date: 05/18/2021  Lower Venous DVT Study Patient Name:  ERRIN STOKES  Date of Exam:   05/18/2021 Medical Rec #: [id number]         Accession #:    [id number] Date of Birth: Dec 18, 1950         Patient Gender: M Patient Age:   46 years Exam Location:  [facility] Procedure:      VAS [place] LOWER EXTREMITY VENOUS (DVT) Referring Phys: Orland Mustard --------------------------------------------------------------------------------  Indications: Pulmonary embolism.  Comparison Study: No previous exams Performing Technologist: Jody Hill RVT, RDMS  Examination Guidelines: A complete evaluation includes B-mode imaging, spectral Doppler, color  Doppler, and power Doppler as needed of all accessible portions of each vessel. Bilateral testing is considered an integral part of a complete examination. Limited examinations for reoccurring indications may be  performed as noted. The reflux portion of the exam is performed with the patient in reverse Trendelenburg.  +---------+---------------+---------+-----------+----------+--------------+ RIGHT    CompressibilityPhasicitySpontaneityPropertiesThrombus Aging +---------+---------------+---------+-----------+----------+--------------+ CFV      Full           Yes      Yes                                 +---------+---------------+---------+-----------+----------+--------------+ SFJ      Full                                                        +---------+---------------+---------+-----------+----------+--------------+ FV Prox  Full           Yes      Yes                                 +---------+---------------+---------+-----------+----------+--------------+ FV Mid   Full           Yes      Yes                                 +---------+---------------+---------+-----------+----------+--------------+ FV DistalFull           Yes      Yes                                 +---------+---------------+---------+-----------+----------+--------------+ PFV      Full                                                        +---------+---------------+---------+-----------+----------+--------------+ POP      Full           Yes      Yes                                 +---------+---------------+---------+-----------+----------+--------------+ PTV      Full                                                        +---------+---------------+---------+-----------+----------+--------------+ PERO     Full                                                        +---------+---------------+---------+-----------+----------+--------------+    +---------+---------------+---------+-----------+----------+--------------+ LEFT     CompressibilityPhasicitySpontaneityPropertiesThrombus Aging +---------+---------------+---------+-----------+----------+--------------+ CFV      Full           Yes      Yes                                 +---------+---------------+---------+-----------+----------+--------------+  SFJ      Full                                                        +---------+---------------+---------+-----------+----------+--------------+ FV Prox  Full           Yes      Yes                                 +---------+---------------+---------+-----------+----------+--------------+ FV Mid   Full           Yes      Yes                                 +---------+---------------+---------+-----------+----------+--------------+ FV DistalFull           Yes      Yes                                 +---------+---------------+---------+-----------+----------+--------------+ PFV      Full                                                        +---------+---------------+---------+-----------+----------+--------------+ POP      Full           Yes      Yes                                 +---------+---------------+---------+-----------+----------+--------------+ PTV      Full                                                        +---------+---------------+---------+-----------+----------+--------------+ PERO     Partial        No       No                   Acute          +---------+---------------+---------+-----------+----------+--------------+     Summary: BILATERAL: - No evidence of superficial venous thrombosis in the lower extremities, bilaterally. -No evidence of popliteal cyst, bilaterally. RIGHT: - There is no evidence of deep vein thrombosis in the lower extremity.  LEFT: - Findings consistent with acute deep vein thrombosis involving the left peroneal veins.  *See table(s) above for  measurements and observations. Electronically signed by Waverly Ferrari MD on 05/18/2021 at 7:17:23 PM.    Final     Procedures Procedures    Medications Ordered in ED Medications  heparin ADULT infusion 100 units/mL (25000 units/270mL) (1,400 Units/hr Intravenous Infusion Verify 05/19/21 0310)  sodium chloride flush (NS) 0.9 % injection 3 mL (3 mLs Intravenous Not Given 05/19/21 0208)  sodium chloride flush (NS) 0.9 % injection 3 mL (has no administration in time range)  0.9 %  sodium chloride infusion (has no administration in time range)  acetaminophen (TYLENOL) tablet 650 mg (has no administration in time range)    Or  acetaminophen (TYLENOL) suppository 650 mg (has no administration in time range)  HYDROcodone-acetaminophen (NORCO) 10-325 MG per tablet 1 tablet (1 tablet Oral Given 05/19/21 0526)  levofloxacin (LEVAQUIN) tablet 750 mg (has no administration in time range)  atorvastatin (LIPITOR) tablet 80 mg (has no administration in time range)  ALPRAZolam (XANAX) tablet 0.25-0.5 mg (0.5 mg Oral Given 05/19/21 0526)  PARoxetine (PAXIL) tablet 40 mg (has no administration in time range)  tamsulosin (FLOMAX) capsule 0.4 mg (has no administration in time range)  clopidogrel (PLAVIX) tablet 75 mg (has no administration in time range)  pantoprazole (PROTONIX) EC tablet 40 mg (has no administration in time range)  gabapentin (NEURONTIN) capsule 200 mg (200 mg Oral Patient Refused/Not Given 05/19/21 0208)  guaiFENesin (MUCINEX) 12 hr tablet 600 mg (600 mg Oral Given 05/18/21 1957)  budesonide (ENTOCORT EC) 24 hr capsule 6 mg (has no administration in time range)  budesonide (ENTOCORT EC) 24 hr capsule 3 mg (3 mg Oral Patient Refused/Not Given 05/19/21 0208)  hyoscyamine (LEVSIN SL) SL tablet 0.125 mg (has no administration in time range)  cholecalciferol (VITAMIN D3) tablet 1,000 Units (has no administration in time range)  iohexol (OMNIPAQUE) 350 MG/ML injection 50 mL (50 mLs Intravenous  Contrast Given 05/18/21 1538)  heparin bolus via infusion 6,000 Units (6,000 Units Intravenous Bolus from Bag 05/18/21 1720)    ED Course/ Medical Decision Making/ A&P                           Medical Decision Making Amount and/or Complexity of Data Reviewed Labs: ordered. Radiology: ordered.  Risk Decision regarding hospitalization.   This patient presents to the ED for concern of shortness of breath, this involves an extensive number of treatment options, and is a complaint that carries with it a high risk of complications and morbidity.  The differential diagnosis includes pneumonia, PE, pneumothorax, pleural effusion, worsening of chronic lung disease   Co morbidities that complicate the patient evaluation  lung cancer s/p right upper lobectomy and right lower lobe wedge resection 2 months ago, PVD, HTN, HLD, CAD s/p CABG, pulmonary fibrosis, chronic pain, anxiety, depression   Additional history obtained:  Additional history obtained from patient's wife External records from outside source obtained and reviewed including EMR   Lab Tests:  I Ordered, and personally interpreted labs.  The pertinent results include: (Results pending at time of signout)   Imaging Studies ordered:  I ordered imaging studies including chest x-ray, CTA chest I independently visualized and interpreted imaging which showed diffuse opacification of right lung I agree with the radiologist interpretation   Cardiac Monitoring: / EKG:  The patient was maintained on a cardiac monitor.  I personally viewed and interpreted the cardiac monitored which showed an underlying rhythm of: Sinus rhythm  Problem List / ED Course / Critical interventions / Medication management  71 year old male with history of pulmonary fibrosis, lung cancer s/p recent resections of right lung with postoperative complications of pneumothorax and pleural effusions, presents for 1 week of worsening shortness of breath.  He is  not on oxygen at baseline, however, he is hypoxic on arrival.  His shortness of breath is worsened with exertion.  On initial exam, patient has severe tachypnea and increased work of breathing.  He was placed on supplemental oxygen by nasal cannula, 5  L.  His SPO2 normalized.  When allowed to rest, patient is able to have decreased work of breathing.  He is able to speak in complete sentences.  Due to his resolution of respiratory distress at rest, initiation of BiPAP was held.  His lung sounds are clear.  There is no evidence of wheezing.  I do not feel the patient requires breathing treatments at this time.  Diagnostic work-up was initiated.  On chest x-ray, patient does have diffuse opacification of his right lung field.  This is the lung where he recently had surgery and postoperative complications.  When compared to prior chest x-rays, it is difficult to determine because of this opacification, which could be simply scarring.  CTA of chest was ordered to further characterize lung abnormalities.  At time of signout, results of lab work and CTA chest were pending.  Care of patient was signed out to oncoming ED provider.  Social Determinants of Health:  Multiple recent hospital admissions  CRITICAL CARE Performed by: Gloris Manchester   Total critical care time: 32 minutes  Critical care time was exclusive of separately billable procedures and treating other patients.  Critical care was necessary to treat or prevent imminent or life-threatening deterioration.  Critical care was time spent personally by me on the following activities: development of treatment plan with patient and/or surrogate as well as nursing, discussions with consultants, evaluation of patient's response to treatment, examination of patient, obtaining history from patient or surrogate, ordering and performing treatments and interventions, ordering and review of laboratory studies, ordering and review of radiographic studies, pulse  oximetry and re-evaluation of patient's condition.         Final Clinical Impression(s) / ED Diagnoses Final diagnoses:  Multiple subsegmental pulmonary emboli without acute cor pulmonale (HCC)  Acute respiratory failure with hypoxia [facility])    Rx / DC Orders ED Discharge Orders     None         Gloris Manchester, MD 05/19/21 [phone]

## 2021-05-18 NOTE — ED Triage Notes (Signed)
Pt. 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 Ive had SOB  with N/V for a week.   ?

## 2021-05-18 NOTE — Assessment & Plan Note (Addendum)
--   Has orthostatic hypotension, for now continue metoprolol but assess risk/benefit as outpatient. ? ?

## 2021-05-18 NOTE — Telephone Encounter (Signed)
Lm for Ferne on number listed below.  ?

## 2021-05-18 NOTE — ED Provider Notes (Signed)
Pt signed out by Dr. Doren Custard pending CT Chest: ? ?  ?IMPRESSION:  ?1. Moderate volume left-sided pulmonary emboli. Positive for acute  ?PE with borderline CT evidence of right heart strain (RV/LV Ratio =  ?0.9) consistent with at least submassive (intermediate risk) PE. The  ?presence of right heart strain has been associated with an increased  ?risk of morbidity and mortality. Please refer to the "PE Focused"  ?order set in EPIC.  ?2. Worsened aeration since 04/13/2021. Right greater than left  ?airspace and ground-glass opacity with architectural distortion,  ?primarily on the right. 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 Concurrent pneumonia is difficult to exclude.  ?3. Small loculated right-sided pleural effusion and trace left  ?pleural fluid.  ?4. Mild thoracic adenopathy which could be reactive or represent  ?metastatic disease. Recommend attention on follow-up.  ?5. Aortic atherosclerosis (ICD10-I70.0) and emphysema (ICD10-J43.9).  ? ?Pt's O2 requirement is now down to 2L.  BP has also improved. ?Pt started on heparin for the PE. ? ?Pt d/w Dr. Rogers Blocker (triad) for admission. ? ?CRITICAL CARE ?Performed by: Isla Pence ? ? ?Total critical care time: 30 minutes ? ?Critical care time was exclusive of separately billable procedures and treating other patients. ? ?Critical care was necessary to treat or prevent imminent or life-threatening deterioration. ? ?Critical care was time spent personally by me on the following activities: development of treatment plan with patient and/or surrogate as well as nursing, discussions with consultants, evaluation of patient's response to treatment, examination of patient, obtaining history from patient or surrogate, ordering and performing treatments and interventions, ordering and review of laboratory studies, ordering and review of radiographic studies, pulse oximetry and re-evaluation  of patient's condition.  ?  ?Isla Pence, MD ?05/18/21 1643 ? ?

## 2021-05-18 NOTE — ED Provider Triage Note (Signed)
Emergency Medicine Provider Triage Evaluation Note ? ?Richard Parrish , a 71 y.o. male  was evaluated in triage.  Pt complains of shortness of breath. Same has been ongoing and worsening for the past week. Hx of lung cancer, states that he had a lobe of his lung removed in January. Not on oxygen at home, states that in the past week he has been urinating in a bucket and sleeping on his couch due to significant shortness of breath. Denies any leg pain or swelling, does state that he had a PE in the past that required clot busting medication, he is on Plavix and denies any missed doses. ? ?Review of Systems  ?Positive: Shortness of breath ?Negative: Chest pain, fevers, chills ? ?Physical Exam  ?BP (!) 89/69 (BP Location: Right Arm)   Pulse 96   Temp (!) 97.5 ?F (36.4 ?C) (Oral)   Resp (!) 32   SpO2 95%  ?Gen:   Awake, no distress   ?Resp:  Tachypneic, satting low 80s on room air ?MSK:   Moves extremities without difficulty  ?Other:   ? ?Medical Decision Making  ?Medically screening exam initiated at 2:14 PM.  Appropriate orders placed.  Richard Parrish was informed that the remainder of the evaluation will be completed by another provider, this initial triage assessment does not replace that evaluation, and the importance of remaining in the ED until their evaluation is complete. ? ?Work-up initiated, informed charge nurse that given patient is hypoxic, tachypneic, and hypotensive, he will need next available room ?  ?Bud Face, PA-C ?05/18/21 1421 ? ?

## 2021-05-18 NOTE — Assessment & Plan Note (Addendum)
--   Hold Flomax for now given orthostatic hypotension.  Assess risk/benefit as outpatient ? ?

## 2021-05-18 NOTE — Assessment & Plan Note (Addendum)
--   With acute flare.  Followed outpatient by Dr. Vaughan Browner.   ?-- Per pulmonology continue prednisone 50 mg until follow-up 4/6 in the.  Bactrim for prophylaxis.   ? ?

## 2021-05-18 NOTE — Assessment & Plan Note (Addendum)
--  Secondary to submassive PE.  CT showing right heart strain, echocardiogram shows normal EF and RV. ?-- Acute hypoxic respiratory failure associated.  Lower extremity Dopplers-positive for left peroneal vein DVT. ?--Continue Eliquis, wean oxygen as tolerated.  Follow-up with pulmonology as an outpatient. ? ?

## 2021-05-18 NOTE — Assessment & Plan Note (Addendum)
--  Secondary to submassive PE complicated by pulmonary fibrosis ?-- Will need oxygen on discharge.   ? ? ? ?

## 2021-05-18 NOTE — Assessment & Plan Note (Addendum)
--  CAD s/p CABG; continue atorvastatin, Plavix ?

## 2021-05-18 NOTE — Telephone Encounter (Signed)
Called and spoke with [place], pt's wife. She states pt has completed prednisone and maybe has 1 more day left of Levofloxacin. She states she does not feel the pt has benefited from either medication and seems to be worse since Dr. Vaughan Browner spoke with him on 3/17. Zada Girt states the pt saw his PCP yesterday, 3/20, and pt needed a wheelchair due to a near syncopal episode and due to weakness and ShOB. Sidonie Dickens that pt needs to seek emergency care as pt has had oral abx and pred without improvement and is worsening. Zada Girt states she will take pt to [facility] ED herself. Sidonie Dickens to call EMS if pt is lethargic, dizzy, too weak to walk, severe ShOB, CP/tightness - Ferne verbalized understanding.  ? ?Will forward to Dr. Vaughan Browner as Juluis Rainier.  ?

## 2021-05-18 NOTE — Assessment & Plan Note (Addendum)
--  Continue lipitor 80mg   ?

## 2021-05-18 NOTE — Assessment & Plan Note (Addendum)
--  Continue paxil and xanax prn  ?

## 2021-05-18 NOTE — Assessment & Plan Note (Addendum)
--  Continue prn norco and gabapentin ?

## 2021-05-18 NOTE — Progress Notes (Addendum)
ANTICOAGULATION CONSULT NOTE - Initial Consult ? ?Pharmacy Consult for heparin ?Indication: pulmonary embolus ? ?Allergies  ?Allergen Reactions  ? Ambien [Zolpidem Tartrate] Other (See Comments)  ?  Bad dreams/Vivid Dreams  ? ? ?Patient Measurements: ?  ?Heparin Dosing Weight: 94.5 kg ? ?Vital Signs: ?Temp: 97.5 ?F (36.4 ?C) (03/21 1347) ?Temp Source: Oral (03/21 1347) ?BP: 110/66 (03/21 1600) ?Pulse Rate: 75 (03/21 1600) ? ?Labs: ?Recent Labs  ?  05/18/21 ?1438 05/18/21 ?1439 05/18/21 ?1456  ?HGB 14.5  --  15.0  ?HCT 42.5  --  44.0  ?PLT 579*  --   --   ?CREATININE  --  0.97 0.80  ?TROPONINIHS 13  --   --   ? ? ?Estimated Creatinine Clearance: 99.6 mL/min (by C-G formula based on SCr of 0.8 mg/dL). ? ? ?Medical History: ?Past Medical History:  ?Diagnosis Date  ? Anxiety   ? Arthritis   ? "lower back" (04/07/2017)  ? BPH (benign prostatic hypertrophy)   ? C. difficile diarrhea   ? Chronic lower back pain   ? Coronary artery disease 1989  ? cabg with dvg to rca   ? Depression   ? GERD (gastroesophageal reflux disease)   ? Hyperlipidemia   ? Hypertension   ? Insomnia   ? Myocardial infarction [facility]) 2002  ? OSA on CPAP   ? cpap setting of 60 per pt  ? Osteoarthritis   ? "hips" (04/07/2017)  ? PAD (peripheral artery disease) ([place])   ? Peripheral vascular disease ([place])   ? Pneumonia 1989  ? S/P CABG  ? Pulmonary fibrosis ([place])   ? "one time" (04/07/2017)  ? Spinal stenosis   ? ? ?Medications:  ?(Not in a hospital admission)  ?Scheduled:  ? heparin  6,000 Units Intravenous Once  ? ?Infusions:  ? heparin    ? ? ?Assessment: ?Patient presented with shortness of breath. Patient was recently hospitalized with pneumothorax, treated with antibiotics. PMH of right lung neoplasm. Patient now found to have an acute submassive PE with borderline CT evidence of right heart strain (RV/LV 0.9). Patients O2 requirement has decreased to 2L. ? ?Patient was not taking anticoagulants PTA. ? ?Goal of Therapy:  ?Heparin level 0.3-0.7  units/ml ?Monitor platelets by anticoagulation protocol: Yes ?  ?Plan:  ?Heparin 6000 unit bolus ?Heparin 1600 units/hour ?Heparin level in 8 hours ?Heparin level and CBC daily ? ?Thank you for allowing pharmacy to participate in this patient's care. ? ?Reatha Harps, PharmD ?PGY1 Pharmacy Resident ?05/18/2021 5:15 PM ?Check [email or web address] for unit specific pharmacy number ? ? ? ?

## 2021-05-18 NOTE — Assessment & Plan Note (Addendum)
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 Kipp Brood and Dr. Valeta Harms later complicated by large pneumothorax on 2/14 requiring chest tube and developed pneumonia.   ? ? ?

## 2021-05-18 NOTE — H&P (Signed)
?History and Physical  ? ? ?Patient: Richard Parrish:[id number] DOB: November 04, 1950 ?DOA: 05/18/2021 ?DOS: the patient was seen and examined on 05/18/2021 ?PCP: Seward Carol, MD  ?Patient coming from: Home - lives with his wife  ? ? ?Chief Complaint: shortness of breath  ? ?HPI: Richard Parrish is a 71 y.o. 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 He has had trouble walking. He stood up and nearly passed out and felt nauseated. He had to crawl to the bathroom and other rooms due to the shortness of breath. He can hardly raise his legs without becoming short of breath and knew he couldn't keep going on like this so came to ED. He states he has had a chronic cough since January. Cough is productive and no change in color or amount of sputum. He had some chest pain with inspiration that radiated to his back a few days, but states he hasn't had this today.  ? ?Has had recurrent issues with right lower lobe infiltrate and recurrent pneumothorax. Called his pulmonologist on 3/17 due to worsening shortness of breath and started on levaquin and prednisone. Finished zpack on 3/18 Told to come to ED if symptoms worsen.  ? ?Took his last dose of prednisone yesterday or today  ? ?Denies any fever/chills, vision changes/headaches, chest pain or palpitations,  abdominal pain, N/V/D, dysuria or leg swelling.  ? ? ?ER Course:  vitals: afebrile, bp: 89/69, HR: 99, RR: 32, oxygen: 87%RA>95% 4L [place],  ?Pertinent labs: wbc: 15.3, bnp: 179,  ?CXR: chronic scarring. Density appears above baseline and could be superimposed pneumonia or edema.  ?CTA chest: moderate volume left sided pulmonary emboli, submassive heart strain. Worsened aeration since 04/13/21. R>L airspace and ground glass opacity with architectural distortion, primarily on the right.   ?Small right loculated pleural effusion and trace left pleural effusion.  ?In ED: started on heparin drip.  ? ? ?Review of Systems: As mentioned in the history of present illness. All other systems reviewed and are negative. ?Past Medical History:  ?Diagnosis Date  ? Anxiety   ? Arthritis   ? "lower back" (04/07/2017)  ? BPH (benign prostatic hypertrophy)   ? C. difficile diarrhea   ? Chronic lower back pain   ? Coronary artery disease 1989  ? cabg with dvg to rca   ? Depression   ? GERD (gastroesophageal reflux disease)   ? Hyperlipidemia   ? Hypertension   ? Insomnia   ? Myocardial infarction [facility]) 2002  ? OSA on CPAP   ? cpap setting of 60 per pt  ? Osteoarthritis   ? "hips" (04/07/2017)  ? PAD (peripheral artery disease) ([place])   ? Peripheral vascular disease ([place])   ? Pneumonia 1989  ? S/P CABG  ? Pulmonary fibrosis ([place])   ? "one time" (04/07/2017)  ? Spinal stenosis   ? ?Past Surgical History:  ?Procedure Laterality Date  ? ABDOMINAL AORTAGRAM N/A 03/15/2011  ? Procedure: ABDOMINAL AORTAGRAM;  Surgeon: Serafina Mitchell, MD;  Location: [facility] CATH LAB;  Service: Cardiovascular;  Laterality: N/A;  ? ABDOMINAL AORTAGRAM N/A 04/12/2011  ? Procedure: ABDOMINAL AORTAGRAM;  Surgeon: Serafina Mitchell, MD;  Location: [facility] CATH LAB;  Service: Cardiovascular;  Laterality: N/A;  ? BACK SURGERY    ? BRONCHIAL BIOPSY  03/03/2021  ? Procedure: BRONCHIAL  BIOPSIES;  Surgeon: Garner Nash, DO;  Location: [place] [facility]OSCOPY;  Service: Pulmonary;;  ? BRONCHIAL BRUSHINGS  03/03/2021  ? Procedure: BRONCHIAL BRUSHINGS;  Surgeon: Garner Nash, DO;  Location: [place];  Service: Pulmonary;;  ? BRONCHIAL NEEDLE ASPIRATION BIOPSY  03/03/2021  ? Procedure: BRONCHIAL NEEDLE ASPIRATION BIOPSIES;  Surgeon: Garner Nash, DO;  Location: [place];  Service: Pulmonary;;  ? CARDIAC CATHETERIZATION  08/1987  ? CORONARY ANGIOPLASTY WITH STENT PLACEMENT  ~ 2002  ? [place], [place]  ? CORONARY ARTERY BYPASS GRAFT  09/1987  ? "CABG X1";  [facility]; [place]; "RCA"  ? FIDUCIAL MARKER PLACEMENT  03/03/2021  ? Procedure: FIDUCIAL DYE MARKING;  Surgeon: Garner Nash, DO;  Location: [place] ENDOSCOPY;  Service: Pulmonary;;  ? INTERCOSTAL NERVE BLOCK  03/03/2021  ? Procedure: INTERCOSTAL NERVE BLOCK;  Surgeon: Lajuana Matte, MD;  Location: [place];  Service: Thoracic;;  ? JOINT REPLACEMENT    ? KNEE ARTHROSCOPY Left   ? LAPAROSCOPIC CHOLECYSTECTOMY    ? LYMPH NODE BIOPSY  03/03/2021  ? Procedure: LYMPH NODE BIOPSY;  Surgeon: Lajuana Matte, MD;  Location: [place];  Service: Thoracic;;  ? LYMPH NODE DISSECTION  03/03/2021  ? Procedure: LYMPH NODE DISSECTION;  Surgeon: Lajuana Matte, MD;  Location: [place];  Service: Thoracic;;  ? PERIPHERAL VASCULAR INTERVENTION Left 2016  ? "put a stent; right branch of left femoral"  ? POSTERIOR LUMBAR FUSION  2009  ? lumbar fusion, S L 5 to L 4  ? RIGHT/LEFT HEART CATH AND CORONARY/GRAFT ANGIOGRAPHY N/A 10/05/2020  ? Procedure: RIGHT/LEFT HEART CATH AND CORONARY/GRAFT ANGIOGRAPHY;  Surgeon: Nelva Bush, MD;  Location: [place] CV LAB;  Service: Cardiovascular;  Laterality: N/A;  ? SHOULDER ARTHROSCOPY W/ ROTATOR CUFF REPAIR Right 2006  ? SHOULDER ARTHROSCOPY WITH SUBACROMIAL DECOMPRESSION AND OPEN ROTATOR C Left ~ 1983  ? TONSILLECTOMY AND ADENOIDECTOMY    ? TOTAL HIP ARTHROPLASTY Right 2012  ? TOTAL HIP ARTHROPLASTY Left 04/07/2017  ? TOTAL HIP ARTHROPLASTY Left 04/07/2017  ? Procedure: LEFT TOTAL HIP ARTHROPLASTY;  Surgeon: Earlie Server, MD;  Location: [place];  Service: Orthopedics;  Laterality: Left;  ? VIDEO BRONCHOSCOPY WITH RADIAL ENDOBRONCHIAL ULTRASOUND  03/03/2021  ? Procedure: VIDEO BRONCHOSCOPY WITH RADIAL ENDOBRONCHIAL ULTRASOUND;  Surgeon: Garner Nash, DO;  Location: [place] [facility];  Service: Pulmonary;;  ? ?Social History:  reports that he quit smoking about 34 years ago. His smoking use included cigarettes. He has a 30.00 pack-year smoking history. He has never used smokeless tobacco. He  reports current alcohol use. He reports that he does not use drugs. ? ?Allergies  ?Allergen Reactions  ? Ambien [Zolpidem Tartrate] Other (See Comments)  ?  Bad dreams/Vivid Dreams  ? ? ?Family History  ?Problem Relation Age of Onset  ? Heart disease Mother   ? Cancer Father   ? ? ?Prior to Admission medications   ?Medication Sig Start Date End Date Taking? Authorizing Provider  ?acetaminophen (TYLENOL) 500 MG tablet Take 500 mg by mouth every 6 (six) hours as needed for moderate pain.    [provider]  ?ALPRAZolam Duanne Moron) 0.5 MG tablet Take 0.25-0.5 mg by mouth 3 (three) times daily as needed for anxiety or sleep. 01/31/21   [provider]  ?antiseptic oral rinse (BIOTENE) LIQD 15 mLs by Mouth Rinse route as needed for dry mouth.    [provider]  ?atorvastatin (LIPITOR) 80 MG tablet TAKE 1 TABLET BY MOUTH EVERY DAY ?Patient  taking differently: Take 80 mg by mouth daily. 02/11/21   Belva Crome, MD  ?budesonide (ENTOCORT EC) 3 MG 24 hr capsule Take 3-6 mg by mouth See admin instructions. Takes 2 capsules (6 mg) by mouth in the morning and take 1 capsule (3 mg) by mouth at night    [provider]  ?cholecalciferol (VITAMIN D3) 25 MCG (1000 UNIT) tablet Take 1,000 Units by mouth in the morning.    [provider]  ?clopidogrel (PLAVIX) 75 MG tablet TAKE 1 TABLET BY MOUTH EVERY DAY ?Patient taking differently: Take 75 mg by mouth daily. 08/27/20   Waynetta Sandy, MD  ?gabapentin (NEURONTIN) 100 MG capsule Take 2 capsules (200 mg total) by mouth 3 (three) times daily. 03/07/21   Nani Skillern, PA-C  ?guaiFENesin (MUCINEX) 600 MG 12 hr tablet Take 1 tablet (600 mg total) by mouth 2 (two) times daily as needed. 04/27/21   Antony Odea, PA-C  ?HYDROcodone-acetaminophen (NORCO) 10-325 MG tablet Take 1 tablet by mouth every 4 (four) hours as needed for moderate pain or severe pain. 04/10/21   [provider]  ?hyoscyamine (LEVSIN, ANASPAZ)  0.125 MG tablet Take 0.125 mg by mouth every 4 (four) hours as needed for cramping.     [provider]  ?isosorbide mononitrate (IMDUR) 30 MG 24 hr tablet Take 1 tablet (30 mg total) by mouth daily. ?Pa

## 2021-05-18 NOTE — Consult Note (Signed)
? ?NAME:  Richard Parrish, MRN:  [id number], DOB:  1951/01/31, LOS: 0 ?ADMISSION DATE:  05/18/2021, CONSULTATION DATE:  05/18/2021 ?REFERRING MD:  Gilford Raid, CHIEF COMPLAINT:  Submassive PE   ? ?History of Present Illness:  ?Richard Parrish is a 71 y.o. male with a PMH significant for CAD s/p PCI, HTN, HLD, pulmonary fibrosis, OSA on CPAP, PVD, and anxiety who presented to the ED due to complaints of worsening shortness of breath and weakness (despite recent treatment with Levaquin and prednisone for presumed CAP) ? ?On ED arrival patient was seen mildly hypotensive with blood pressure 89/69 and RR was elevated at 32. CTA chest was preformed and revealed moderate left sided PE with RV/LV ratio of 0.9. abd CT evidence of right heart strain. Given extensive underly pulmonary history with new PE, PCCM was consulted for pulm consult.  ? ?Pertinent  Medical History  ?CAD s/p PCI, HTN, HLD, pulmonary fibrosis, OSA on CPAP, PVD, and anxiety  ? ?Significant Hospital Events: ?Including procedures, antibiotic start and stop dates in addition to other pertinent events   ?3/21 Presented to the ED for complaints of worsening SOB and weakness found to have submassive PE on CTA  ? ?Interim History / Subjective:  ?As above  ? ?Objective   ?Blood pressure 110/66, pulse 75, temperature (!) 97.5 ?F (36.4 ?C), temperature source Oral, resp. rate 19, SpO2 94 %. ?   ?   ?No intake or output data in the 24 hours ending 05/18/21 1648 ?There were no vitals filed for this visit. ? ?Examination: ?General: Very pleasant elderly male lying in bed in no acute distress ?HEENT: [place]/AT, MM pink/moist, PERRL,  ?Neuro: Alert and oriented x3, nonfocal ?CV: s1s2 regular rate and rhythm, no murmur, rubs, or gallops,  ?PULM: No increased work of breathing, clear breath sounds bilaterally, slightly shallow respirations, nonproductive cough ?GI: soft, bowel sounds active in all 4 quadrants, non-tender, non-distended, ?Extremities: warm/dry, no edema  ?Skin:  no rashes or lesions ? ?Resolved Hospital Problem list   ? ? ?Assessment & Plan:  ?Submassive PE with CT evidence of right heart strain ?- CTA chest was preformed and revealed moderate left sided PE with RV/LV ratio of 0.9. abd CT evidence of right heart strain ?-PESI score on admit (when hypotensive and tachypnic) was 172 ?Hx of pulmonary fibrosis ?-Follows with Dr. Vaughan Browner. 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 However given his extensive cardiac and pulmonary history if ECHO reveals any concern for RV dysfunction we may need to consider IR evaluation for possible catheter directed lysis  ?If patient decompensates push systemic lytics  ? ? ?Best Practice (right click and "Reselect all SmartList Selections" daily)  ?Per primary  ? ?Labs   ?CBC: ?Recent Labs  ?Lab 05/18/21 ?1438 05/18/21 ?1456  ?WBC 15.3*  --   ?NEUTROABS 13.7*  --   ?HGB 14.5 15.0  ?HCT 42.5 44.0  ?MCV 94.0  --   ?PLT 579*  --   ? ? ?Basic Metabolic Panel: ?Recent Labs  ?Lab 05/18/21 ?1439 05/18/21 ?1456  ?NA 136 137  ?K 3.9 4.0  ?CL 102 103  ?CO2 22  --   ?GLUCOSE 141* 139*  ?BUN 18  21  ?CREATININE 0.97 0.80  ?CALCIUM 9.2  --   ?MG 2.0  --   ? ?GFR: ?Estimated Creatinine Clearance: 99.6 mL/min (by C-G formula based on SCr of 0.8 mg/dL). ?Recent Labs  ?Lab 05/18/21 ?1438  ?WBC 15.3*  ? ? ?Liver Function Tests: ?Recent Labs  ?Lab 05/18/21 ?1439  ?AST 33  ?ALT 29  ?ALKPHOS 95  ?BILITOT 1.0  ?PROT 6.8  ?ALBUMIN 3.1*  ? ?No results for input(s): LIPASE, AMYLASE in the last 168 hours. ?No results for input(s): AMMONIA in the last 168 hours. ? ?ABG ?   ?Component Value Date/Time  ? PHART 7.402 03/02/2021 1124  ? PCO2ART  38.9 03/02/2021 1124  ? PO2ART 102 03/02/2021 1124  ? HCO3 23.7 03/02/2021 1124  ? TCO2 25 05/18/2021 1456  ? ACIDBASEDEF 0.5 03/02/2021 1124  ? O2SAT 97.6 03/02/2021 1124  ?  ? ?Coagulation Profile: ?No results for input(s): INR, PROTIME in the last 168 hours. ? ?Cardiac Enzymes: ?No results for input(s): CKTOTAL, CKMB, CKMBINDEX, TROPONINI in the last 168 hours. ? ?HbA1C: ?Hgb A1c MFr Bld  ?Date/Time Value Ref Range Status  ?10/03/2020 07:49 PM 5.3 4.8 - 5.6 % Final  ?  Comment:  ?  (NOTE) ?Pre diabetes:          5.7%-6.4% ? ?Diabetes:              >6.4% ? ?Glycemic control for   <7.0% ?adults with diabetes ?  ?01/08/2018 08:21 AM 5.1 4.8 - 5.6 % Final  ?  Comment:  ?           Prediabetes: 5.7 - 6.4 ?         Diabetes: >6.4 ?         Glycemic control for adults with diabetes: <7.0 ?  ? ? ?CBG: ?No results for input(s): GLUCAP in the last 168 hours. ? ?Review of Systems:   ?Please see the history of present illness. All other systems reviewed and are negative  ? ?Past Medical History:  ?He,  has a past medical history of Anxiety, Arthritis, BPH (benign prostatic hypertrophy), C. difficile diarrhea, Chronic lower back pain, Coronary artery disease (1989), Depression, GERD (gastroesophageal reflux disease), Hyperlipidemia, Hypertension, Insomnia, Myocardial infarction ([place]) (2002), OSA on CPAP, Osteoarthritis, PAD (peripheral artery disease) ([place]), Peripheral vascular disease ([place]), Pneumonia (1989), Pulmonary fibrosis ([place]), and Spinal stenosis.  ? ?Surgical History:  ? ?Past Surgical History:  ?Procedure Laterality Date  ? ABDOMINAL AORTAGRAM N/A 03/15/2011  ? Procedure: ABDOMINAL AORTAGRAM;  Surgeon: Serafina Mitchell, MD;  Location: [facility] CATH LAB;  Service: Cardiovascular;  Laterality: N/A;  ? ABDOMINAL AORTAGRAM N/A 04/12/2011  ? Procedure: ABDOMINAL AORTAGRAM;  Surgeon: Serafina Mitchell, MD;  Location: [facility] CATH LAB;  Service: Cardiovascular;  Laterality: N/A;  ? BACK SURGERY    ? BRONCHIAL BIOPSY  03/03/2021  ?  Procedure: BRONCHIAL BIOPSIES;  Surgeon: Garner Nash, DO;  Location: [facility] ENDOSCOPY;  Service: Pulmonary;;  ? BRONCHIAL BRUSHINGS  03/03/2021  ? Procedure: BRONCHIAL BRUSHINGS;  Surgeon: Garner Nash, DO;  Location: [place];  Service: Pulmonary;;  ? BRONCHIAL NEEDLE ASPIRATION BIOPSY  03/03/2021  ? Procedure: BRONCHIAL NEEDLE ASPIRATION BIOPSIES;  Surgeon: Garner Nash, DO;  Location: [facility];  Service: Pulmonary;;  ? CARDIAC CATHETERIZATION  08/1987  ? CORONARY ANGIOPLASTY WITH STENT PLACEMENT  ~ 2002  ? [place], [place]  ? CORONARY ARTERY BYPASS GRAFT  09/1987  ? "CABG X1";  [facility]; [place]; "RCA"  ? FIDUCIAL  MARKER PLACEMENT  03/03/2021  ? Procedure: FIDUCIAL DYE MARKING;  Surgeon: Garner Nash, DO;  Location: [facility] ENDOSCOPY;  Service: Pulmonary;;  ? INTERCOSTAL NERVE BLOCK  03/03/2021  ? Procedure: INTERCOSTAL NERVE BLOCK;  Surgeon: Lajuana Matte, MD;  Location: [facility];  Service: Thoracic;;  ? JOINT REPLACEMENT    ? KNEE ARTHROSCOPY Left   ? LAPAROSCOPIC CHOLECYSTECTOMY    ? LYMPH NODE BIOPSY  03/03/2021  ? Procedure: LYMPH NODE BIOPSY;  Surgeon: Lajuana Matte, MD;  Location: [facility];  Service: Thoracic;;  ? LYMPH NODE DISSECTION  03/03/2021  ? Procedure: LYMPH NODE DISSECTION;  Surgeon: Lajuana Matte, MD;  Location: [facility];  Service: Thoracic;;  ? PERIPHERAL VASCULAR INTERVENTION Left 2016  ? "put a stent; right branch of left femoral"  ? POSTERIOR LUMBAR FUSION  2009  ? lumbar fusion, S L 5 to L 4  ? RIGHT/LEFT HEART CATH AND CORONARY/GRAFT ANGIOGRAPHY N/A 10/05/2020  ? Procedure: RIGHT/LEFT HEART CATH AND CORONARY/GRAFT ANGIOGRAPHY;  Surgeon: Nelva Bush, MD;  Location: [facility] CV LAB;  Service: Cardiovascular;  Laterality: N/A;  ? SHOULDER ARTHROSCOPY W/ ROTATOR CUFF REPAIR Right 2006  ? SHOULDER ARTHROSCOPY WITH SUBACROMIAL DECOMPRESSION AND OPEN ROTATOR C Left ~ 1983  ? TONSILLECTOMY AND ADENOIDECTOMY    ? TOTAL HIP ARTHROPLASTY Right 2012  ? TOTAL  HIP ARTHROPLASTY Left 04/07/2017  ? TOTAL HIP ARTHROPLASTY Left 04/07/2017  ? Procedure: LEFT TOTAL HIP ARTHROPLASTY;  Surgeon: Earlie Server, MD;  Location: [facility];  Service: Orthopedics;  Laterality: Left;  ? VID

## 2021-05-18 NOTE — Progress Notes (Signed)
ABG attempted by RT X2 and was unsuccessful. ?

## 2021-05-19 ENCOUNTER — Telehealth: Payer: Self-pay | Admitting: Interventional Cardiology

## 2021-05-19 ENCOUNTER — Other Ambulatory Visit (HOSPITAL_COMMUNITY): Payer: PPO

## 2021-05-19 ENCOUNTER — Inpatient Hospital Stay (HOSPITAL_COMMUNITY): Payer: PPO

## 2021-05-19 DIAGNOSIS — F419 Anxiety disorder, unspecified: Secondary | ICD-10-CM

## 2021-05-19 DIAGNOSIS — J849 Interstitial pulmonary disease, unspecified: Secondary | ICD-10-CM

## 2021-05-19 DIAGNOSIS — I3139 Other pericardial effusion (noninflammatory): Secondary | ICD-10-CM | POA: Diagnosis not present

## 2021-05-19 DIAGNOSIS — I2693 Single subsegmental pulmonary embolism without acute cor pulmonale: Secondary | ICD-10-CM | POA: Diagnosis not present

## 2021-05-19 DIAGNOSIS — J9601 Acute respiratory failure with hypoxia: Secondary | ICD-10-CM | POA: Diagnosis not present

## 2021-05-19 DIAGNOSIS — I25110 Atherosclerotic heart disease of native coronary artery with unstable angina pectoris: Secondary | ICD-10-CM | POA: Diagnosis not present

## 2021-05-19 LAB — ECHOCARDIOGRAM COMPLETE
AR max vel: 3.11 cm2
AV Peak grad: 5.2 mmHg
Ao pk vel: 1.14 m/s
Area-P 1/2: 3.81 cm2
Height: 72 in
S' Lateral: 3.55 cm
Weight: 3333.36 oz

## 2021-05-19 LAB — CBC
HCT: 40.1 % (ref 39.0–52.0)
Hemoglobin: 13.7 g/dL (ref 13.0–17.0)
MCH: 31.7 pg (ref 26.0–34.0)
MCHC: 34.2 g/dL (ref 30.0–36.0)
MCV: 92.8 fL (ref 80.0–100.0)
Platelets: 413 10*3/uL — ABNORMAL HIGH (ref 150–400)
RBC: 4.32 MIL/uL (ref 4.22–5.81)
RDW: 14.2 % (ref 11.5–15.5)
WBC: 12 10*3/uL — ABNORMAL HIGH (ref 4.0–10.5)
nRBC: 0 % (ref 0.0–0.2)

## 2021-05-19 LAB — BASIC METABOLIC PANEL
Anion gap: 11 (ref 5–15)
BUN: 14 mg/dL (ref 8–23)
CO2: 25 mmol/L (ref 22–32)
Calcium: 9.1 mg/dL (ref 8.9–10.3)
Chloride: 105 mmol/L (ref 98–111)
Creatinine, Ser: 0.82 mg/dL (ref 0.61–1.24)
GFR, Estimated: >60 mL/min (ref >60)
Glucose, Bld: 119 mg/dL — ABNORMAL HIGH (ref 70–99)
Potassium: 3.6 mmol/L (ref 3.5–5.1)
Sodium: 141 mmol/L (ref 135–145)

## 2021-05-19 LAB — SEDIMENTATION RATE: Sed Rate: 50 mm/hr — ABNORMAL HIGH (ref 0–16)

## 2021-05-19 LAB — PROCALCITONIN: Procalcitonin: <0.1 ng/mL

## 2021-05-19 LAB — HEPARIN LEVEL (UNFRACTIONATED)
Heparin Unfractionated: 0.9 IU/mL — ABNORMAL HIGH (ref 0.30–0.70)
Heparin Unfractionated: 0.55 IU/mL (ref 0.30–0.70)

## 2021-05-19 MED ORDER — METOPROLOL TARTRATE 5 MG/5ML IV SOLN: 5.0000 mg | INTRAVENOUS | Status: DC | PRN

## 2021-05-19 MED ORDER — METHYLPREDNISOLONE SODIUM SUCC 125 MG IJ SOLR
125.0000 mg | Freq: Every day | INTRAMUSCULAR | Status: AC
  Administered 2021-05-20 – 2021-05-23 (×4): 125 mg via INTRAVENOUS
  Filled 2021-05-19 (×4): qty 2

## 2021-05-19 MED ORDER — APIXABAN 5 MG PO TABS
5.0000 mg | ORAL_TABLET | Freq: Two times a day (BID) | ORAL | Status: DC
  Administered 2021-05-26: 5 mg via ORAL
  Filled 2021-05-19: qty 1

## 2021-05-19 MED ORDER — TRAZODONE HCL 50 MG PO TABS
50.0000 mg | ORAL_TABLET | Freq: Every evening | ORAL | Status: DC | PRN
Start: 2021-05-19 — End: 2021-05-27

## 2021-05-19 MED ORDER — SENNOSIDES-DOCUSATE SODIUM 8.6-50 MG PO TABS: 1.0000 | ORAL_TABLET | Freq: Every evening | ORAL | Status: DC | PRN

## 2021-05-19 MED ORDER — IPRATROPIUM-ALBUTEROL 0.5-2.5 (3) MG/3ML IN SOLN: 3.0000 mL | RESPIRATORY_TRACT | Status: DC | PRN

## 2021-05-19 MED ORDER — METHYLPREDNISOLONE SODIUM SUCC 125 MG IJ SOLR
80.0000 mg | Freq: Every day | INTRAMUSCULAR | Status: DC
  Administered 2021-05-19: 80 mg via INTRAVENOUS
  Filled 2021-05-19: qty 2

## 2021-05-19 MED ORDER — PERFLUTREN LIPID MICROSPHERE
1.0000 mL | INTRAVENOUS | Status: AC | PRN
  Administered 2021-05-19: 4 mL via INTRAVENOUS
  Filled 2021-05-19: qty 10

## 2021-05-19 MED ORDER — APIXABAN 5 MG PO TABS
10.0000 mg | ORAL_TABLET | Freq: Two times a day (BID) | ORAL | Status: AC
Start: 2021-05-19 — End: 2021-05-25
  Administered 2021-05-19 – 2021-05-25 (×14): 10 mg via ORAL
  Filled 2021-05-19 (×14): qty 2

## 2021-05-19 MED ORDER — HYDRALAZINE HCL 20 MG/ML IJ SOLN
10.0000 mg | INTRAMUSCULAR | Status: DC | PRN
Start: 2021-05-19 — End: 2021-05-27

## 2021-05-19 NOTE — Progress Notes (Signed)
05/19/2021 ? ?I have seen and evaluated the patient for multifactorial acute on chronic hypoxemic respiratory failure. ? ?S:  ?Still feels very SOB with any exertion.  Vitals benign. ? ?O: ?Blood pressure 100/82, pulse 74, temperature 97.9 ?F (36.6 ?C), temperature source Oral, resp. rate 20, height 6' (1.829 m), weight 94.5 kg, SpO2 98 %.  ?No distress ?Crackles bilaterally on exam ? ?BMP stable ?No new chest imaging ?Saturating well on 3LPM ?Pct neg ? ?A:  ?Acute on chronic hypoxemic respiratory failure due to some combination of acute on chronic radiation pneumonitis flare and pulmonary embolism. ? ?Submassive PE with exaggerated RV dysfunction due to underlying chronic lung disease.  ?Poor ambulation predisposing.  Do not think needs advanced therapies ? ?Right bronchopleural fistula resolved ? ?RUL lobectomy s/p RULobectomy with concurrent chemoradiation ? ?CAD s/p CABG ? ?PVD ? ?HTN, HLD, OSA on CPAP ? ?Anxiety/depression ? ?P:  ?- His CT is pretty impressive with GGO that don't look like infarcts, will start IV steroids for a few days ?- Wean O2 for sats >90% ?- Transition to NoAC, would recommend at least 6 months therapy if not lifelong, will defer to Dr. Vaughan Browner his primary outpatient PCCM doc ?- Will follow until looking better ? ?Erskine Emery MD ?[facility]monary Critical [facility] ?Prefer epic messenger for cross cover needs ?If after hours, please call E-link ? ?

## 2021-05-19 NOTE — Telephone Encounter (Signed)
Wife called to say patient is in the hospital with a blood clot in his lung. Please advise ?

## 2021-05-19 NOTE — Progress Notes (Signed)
?PROGRESS NOTE ? ? ? ?Richard Parrish  HYQ:[id number] DOB: Jul 06, 1950 DOA: 05/18/2021 ?PCP: Seward Carol, MD  ? 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  About a month ago patient was diagnosed with pneumothorax requiring chest tube complicated by pneumonia and eventually discharged on 7 days of Augmentin.  When he saw pulmonary about a week ago he was given a course of prednisone, Z-Pak and later switched to Levaquin.  Last day of Levaquin 05/20/2021.   ? ?Upon this admission he was diagnosed with submassive PE with right heart strain, seen by pulmonary team.  Currently on heparin drip not a candidate for thrombolytics.  Lower extremity Dopplers is positive for left peroneal DVT. ? ? ?Assessment & Plan: ? Principal Problem: ?  Pulmonary embolism ([place]) ?Active Problems: ?  Acute respiratory failure with hypoxia ([place]) ?  Pulmonary fibrosis ([place]) ?  Malignant neoplasm of upper lobe of right lung ([place]) ?  Essential hypertension, benign ?  CAD (coronary artery disease), native coronary artery ?  Anxiety ?  Low back pain ?  HLD (hyperlipidemia) ?  BPH (benign prostatic hyperplasia) ?  ? ? ?Assessment and Plan: ?* Pulmonary embolism ([place]) ?Secondary to submassive PE, continue supplemental oxygen.  On heparin drip.  CT showing right heart strain, echocardiogram. ?Lower extremity Dopplers-positive for left peroneal vein DVT. ?Pain medication ?Will transition to p.o. anticoagulation prior to discharge- Eliquis or Xarelto ? ? ?Acute respiratory failure with hypoxia ([place]) ?Secondary to submassive PE and pulmonary fibrosis.  Not on oxygen at home.  Currently on 4-5 L nasal cannula. ?Heparin drip ? ? ? ? ?Pulmonary fibrosis ([place]) ?Followed outpatient by Dr. Vaughan Browner.  Completed 5-day course of  prednisone upon admission.  Currently on Levaquin, last day 3/23. ?Seen by pulmonary team-appreciate recs ? ? ?Malignant neoplasm of upper lobe of right lung ([place]) ?RUL adenocarcinoma of the lung s/p bronchoscopy and robotic resection on 03/03/21 by Dr. Kipp Brood and Dr. Valeta Harms later complicated by large pneumothorax on 2/14 requiring chest tube and developed pneumonia.  Eventually discharged on Augmentin ? ? ? ?Essential hypertension, benign ?Initially soft blood pressure therefore metoprolol and Imdur on hold. ?Adjust further meds as necessary ? ? ?CAD (coronary artery disease), native coronary artery ?CAD s/p CABG; continue atorvastatin, Plavix ?Holding Toprol in setting of hypotension, resume when appropriate ? ?Anxiety ?Continue paxil and xanax prn  ? ?Low back pain ?Continue prn norco and gabapentin ? ?HLD (hyperlipidemia) ?Continue lipitor 80mg   ? ?BPH (benign prostatic hyperplasia) ?Continue Flomax daily ? ? ? ? ? ? ?  ? ? ? ?DVT prophylaxis: Hep Drip ?Code Status: Full ?Family Communication: Wife is at bedside ? ?Status is: Inpatient ?Remains inpatient appropriate because: On going work up for submassive PE, still has exertional SOB. Remains on Hep Drip.  ? ? ? ? ? ?Subjective: ?Patient feels little better this morning but still getting short of breath getting up with minimal ambulation.  At rest he feels okay. ? ?Review of Systems ?Otherwise negative except as per HPI, including: ?General: Denies fever, chills, night sweats or unintended weight loss. ?Resp: Denies cough, wheezing, shortness of breath. ?Cardiac: Denies chest pain, palpitations, orthopnea, paroxysmal nocturnal dyspnea. ?GI: Denies abdominal pain, nausea, vomiting, diarrhea or constipation ?GU: Denies dysuria, frequency, hesitancy or incontinence ?MS: Denies muscle aches, joint pain or swelling ?  Neuro: Denies headache, neurologic deficits (focal weakness, numbness, tingling), abnormal gait ?Psych: Denies anxiety, depression, SI/HI/AVH ?Skin:  Denies new rashes or lesions ?ID: Denies sick contacts, exotic exposures, travel ? ?Examination: ? ?General exam: Appears calm and comfortable  ?Respiratory system: Clear to auscultation. Respiratory effort normal. ?Cardiovascular system: S1 & S2 heard, RRR. No JVD, murmurs, rubs, gallops or clicks. No pedal edema. ?Gastrointestinal system: Abdomen is nondistended, soft and nontender. No organomegaly or masses felt. Normal bowel sounds heard. ?Central nervous system: Alert and oriented. No focal neurological deficits. ?Extremities: Symmetric 5 x 5 power. ?Skin: No rashes, lesions or ulcers ?Psychiatry: Judgement and insight appear normal. Mood & affect appropriate.  ? ? ? ?Objective: ?Vitals:  ? 05/18/21 1944 05/18/21 2305 05/19/21 0315 05/19/21 2202  ?BP:  120/73 (!) 164/84 100/82  ?Pulse:  70 68 74  ?Resp:  16 20 20   ?Temp:  97.6 ?F (36.4 ?C) 98.2 ?F (36.8 ?C) 97.9 ?F (36.6 ?C)  ?TempSrc:  Oral Oral Oral  ?SpO2: 92% 98% 91% 98%  ?Weight:      ?Height:      ? ? ?Intake/Output Summary (Last 24 hours) at 05/19/2021 0749 ?Last data filed at 05/19/2021 5427 ?Gross per 24 hour  ?Intake 215.97 ml  ?Output 180 ml  ?Net 35.97 ml  ? ?Filed Weights  ? 05/18/21 1700  ?Weight: 94.5 kg  ? ? ? ?Data Reviewed:  ? ?CBC: ?Recent Labs  ?Lab 05/18/21 ?1438 05/18/21 ?1456 05/19/21 ?0115  ?WBC 15.3*  --  12.0*  ?NEUTROABS 13.7*  --   --   ?HGB 14.5 15.0 13.7  ?HCT 42.5 44.0 40.1  ?MCV 94.0  --  92.8  ?PLT 579*  --  413*  ? ?Basic Metabolic Panel: ?Recent Labs  ?Lab 05/18/21 ?1439 05/18/21 ?1456 05/19/21 ?0115  ?NA 136 137 141  ?K 3.9 4.0 3.6  ?CL 102 103 105  ?CO2 22  --  25  ?GLUCOSE 141* 139* 119*  ?BUN 18 21 14   ?CREATININE 0.97 0.80 0.82  ?CALCIUM 9.2  --  9.1  ?MG 2.0  --   --   ? ?GFR: ?Estimated Creatinine Clearance: 97.2 mL/min (by C-G formula based on SCr of 0.82 mg/dL). ?Liver Function Tests: ?Recent Labs  ?Lab 05/18/21 ?1439  ?AST 33  ?ALT 29  ?ALKPHOS 95  ?BILITOT 1.0  ?PROT 6.8  ?ALBUMIN 3.1*  ? ?No results for input(s):  LIPASE, AMYLASE in the last 168 hours. ?No results for input(s): AMMONIA in the last 168 hours. ?Coagulation Profile: ?No results for input(s): INR, PROTIME in the last 168 hours. ?Cardiac Enzymes: ?No results for input(s): CKTOTAL, CKMB, CKMBINDEX, TROPONINI in the last 168 hours. ?BNP (last 3 results) ?No results for input(s): PROBNP in the last 8760 hours. ?HbA1C: ?No results for input(s): HGBA1C in the last 72 hours. ?CBG: ?No results for input(s): GLUCAP in the last 168 hours. ?Lipid Profile: ?No results for input(s): CHOL, HDL, LDLCALC, TRIG, CHOLHDL, LDLDIRECT in the last 72 hours. ?Thyroid Function Tests: ?No results for input(s): TSH, T4TOTAL, FREET4, T3FREE, THYROIDAB in the last 72 hours. ?Anemia Panel: ?No results for input(s): VITAMINB12, FOLATE, FERRITIN, TIBC, IRON, RETICCTPCT in the last 72 hours. ?Sepsis Labs: ?Recent Labs  ?Lab 05/18/21 ?1649 05/18/21 ?2041  ?LATICACIDVEN 1.5 1.8  ? ? ?Recent Results (from the past 240 hour(s))  ?Blood culture (routine x 2)     Status: None (Preliminary result)  ? Collection Time: 05/18/21  2:40 PM  ? Specimen: Right Antecubital; Blood  ?Result Value Ref Range  Status  ? Specimen Description RIGHT ANTECUBITAL  Final  ? Special Requests   Final  ?  BOTTLES DRAWN AEROBIC AND ANAEROBIC Blood Culture adequate volume  ? Culture   Final  ?  NO GROWTH < 24 HOURS ?Performed at [facility], [place] [address] [place], [place] [address] ?  ? Report Status PENDING  Incomplete  ?Resp Panel by RT-PCR (Flu A&B, Covid) Nasopharyngeal Swab     Status: None  ? Collection Time: 05/18/21  2:40 PM  ? Specimen: Nasopharyngeal Swab; Nasopharyngeal(NP) swabs in vial transport medium  ?Result Value Ref Range Status  ? SARS Coronavirus 2 by RT PCR NEGATIVE NEGATIVE Final  ?  Comment: (NOTE) ?SARS-CoV-2 target nucleic acids are NOT DETECTED. ? ?The SARS-CoV-2 RNA is generally detectable in upper respiratory ?specimens during the acute phase of infection. The lowest ?concentration of  SARS-CoV-2 viral copies this assay can detect is ?138 copies/mL. A negative result does not preclude SARS-Cov-2 ?infection and should not be used as the sole basis for treatment or ?other patient management decision

## 2021-05-19 NOTE — Progress Notes (Signed)
ANTICOAGULATION CONSULT NOTE ? ?Pharmacy Consult for heparin ?Indication: pulmonary embolus ? ?Allergies  ?Allergen Reactions  ? Ambien [Zolpidem Tartrate] Other (See Comments)  ?  Bad dreams/Vivid Dreams  ? ? ?Patient Measurements: ?Height: 6' (182.9 cm) ?Weight: 94.5 kg (208 lb 5.4 oz) ?IBW/kg (Calculated) : 77.6 ?Heparin Dosing Weight: 95 kg ? ?Vital Signs: ?Temp: 97.6 ?F (36.4 ?C) (03/21 2305) ?Temp Source: Oral (03/21 2305) ?BP: 120/73 (03/21 2305) ?Pulse Rate: 70 (03/21 2305) ? ?Labs: ?Recent Labs  ?  05/18/21 ?1438 05/18/21 ?1439 05/18/21 ?1456 05/18/21 ?1649 05/19/21 ?0115  ?HGB 14.5  --  15.0  --  13.7  ?HCT 42.5  --  44.0  --  40.1  ?PLT 579*  --   --   --  413*  ?HEPARINUNFRC  --   --   --   --  0.90*  ?CREATININE  --  0.97 0.80  --  0.82  ?TROPONINIHS 13  --   --  10  --   ? ? ?Estimated Creatinine Clearance: 97.2 mL/min (by C-G formula based on SCr of 0.82 mg/dL). ? ? ?Medical History: ?Past Medical History:  ?Diagnosis Date  ? Anxiety   ? Arthritis   ? "lower back" (04/07/2017)  ? BPH (benign prostatic hypertrophy)   ? C. difficile diarrhea   ? Chronic lower back pain   ? Coronary artery disease 1989  ? cabg with dvg to rca   ? Depression   ? GERD (gastroesophageal reflux disease)   ? Hyperlipidemia   ? Hypertension   ? Insomnia   ? Myocardial infarction [facility]) 2002  ? OSA on CPAP   ? cpap setting of 60 per pt  ? Osteoarthritis   ? "hips" (04/07/2017)  ? PAD (peripheral artery disease) ([place])   ? Peripheral vascular disease ([place])   ? Pneumonia 1989  ? S/P CABG  ? Pulmonary fibrosis ([place])   ? "one time" (04/07/2017)  ? Spinal stenosis   ? ? ?Assessment: ?Patient presented with shortness of breath. Patient was recently hospitalized with pneumothorax, treated with antibiotics. PMH of right lung neoplasm. Patient now found to have an acute submassive PE with borderline CT evidence of right heart strain (RV/LV 0.9). Patients O2 requirement has decreased to 2L. ?  ?Patient was not taking anticoagulants  PTA. ? ?Heparin level returned supratherapeutic at 0.90 on 1600 units/hr. Per RN no issues with infusion, no s/sx of bleeding. ? ?Goal of Therapy:  ?Heparin level 0.3-0.7 units/ml ?Monitor platelets by anticoagulation protocol: Yes ?  ?Plan:  ?Decrease heparin infusion to 1400 units/hr ?Check anti-Xa level in 6 hours ?Continue to monitor H&H and platelets ? ?Carma Lair, PharmD Candidate [phone] ?05/19/2021,2:54 AM ? ? ?

## 2021-05-20 ENCOUNTER — Encounter (HOSPITAL_COMMUNITY): Payer: Self-pay | Admitting: Family Medicine

## 2021-05-20 ENCOUNTER — Ambulatory Visit: Payer: Medicare Other | Admitting: Interventional Cardiology

## 2021-05-20 DIAGNOSIS — F419 Anxiety disorder, unspecified: Secondary | ICD-10-CM | POA: Diagnosis not present

## 2021-05-20 DIAGNOSIS — N400 Enlarged prostate without lower urinary tract symptoms: Secondary | ICD-10-CM

## 2021-05-20 DIAGNOSIS — J9601 Acute respiratory failure with hypoxia: Secondary | ICD-10-CM | POA: Diagnosis not present

## 2021-05-20 DIAGNOSIS — I2693 Single subsegmental pulmonary embolism without acute cor pulmonale: Secondary | ICD-10-CM | POA: Diagnosis not present

## 2021-05-20 DIAGNOSIS — E785 Hyperlipidemia, unspecified: Secondary | ICD-10-CM

## 2021-05-20 DIAGNOSIS — I10 Essential (primary) hypertension: Secondary | ICD-10-CM

## 2021-05-20 LAB — BASIC METABOLIC PANEL
Anion gap: 8 (ref 5–15)
BUN: 13 mg/dL (ref 8–23)
CO2: 23 mmol/L (ref 22–32)
Calcium: 8.9 mg/dL (ref 8.9–10.3)
Chloride: 106 mmol/L (ref 98–111)
Creatinine, Ser: 0.7 mg/dL (ref 0.61–1.24)
GFR, Estimated: >60 mL/min (ref >60)
Glucose, Bld: 121 mg/dL — ABNORMAL HIGH (ref 70–99)
Potassium: 3.7 mmol/L (ref 3.5–5.1)
Sodium: 137 mmol/L (ref 135–145)

## 2021-05-20 LAB — CBC
HCT: 38.3 % — ABNORMAL LOW (ref 39.0–52.0)
Hemoglobin: 13.1 g/dL (ref 13.0–17.0)
MCH: 31.3 pg (ref 26.0–34.0)
MCHC: 34.2 g/dL (ref 30.0–36.0)
MCV: 91.6 fL (ref 80.0–100.0)
Platelets: 493 10*3/uL — ABNORMAL HIGH (ref 150–400)
RBC: 4.18 MIL/uL — ABNORMAL LOW (ref 4.22–5.81)
RDW: 13.9 % (ref 11.5–15.5)
WBC: 12.4 10*3/uL — ABNORMAL HIGH (ref 4.0–10.5)
nRBC: 0 % (ref 0.0–0.2)

## 2021-05-20 LAB — SEDIMENTATION RATE: Sed Rate: 43 mm/hr — ABNORMAL HIGH (ref 0–16)

## 2021-05-20 LAB — MAGNESIUM: Magnesium: 2.2 mg/dL (ref 1.7–2.4)

## 2021-05-20 LAB — SURGICAL PATHOLOGY

## 2021-05-20 NOTE — Progress Notes (Signed)
Patient assisted to sit on side of bed, He then became visibly short of breath with sitting on side of the bed. Patient currently on 4LNC with increasing work of breathing with movement.  ? ?Patient oxygen probe placed on ear as fingers were cool. Patient agreeable to bathing, Patient assisted to stand with RW with continual complaints of shortness of breath and oxygen sats decreasing to 85% on the 4L [place]. And heart rate increased to 124 on monitor. Oxygen increased to 6L [place] so patient could receive a bath .Sats ranged from 80-86% during this time. Patient then assisted back to bed and patient work of breathing eased over several minutes time and oxygen saturation increased to 90-93%. Encouraged patient to utilize slow breaths in through nose and out through mouth.  Patients oxygen then weaned back down to 4L [place] and oxygen stats holding around 94%. Patient with call bell with in reach. Amani Marseille, Bettina Gavia ?RN  ?

## 2021-05-20 NOTE — TOC Benefit Eligibility Note (Signed)
Transition of Care (TOC) Benefit Eligibility Note  ? ? ?Patient Details  ?Name: Richard Parrish ?MRN: [id number] ?Date of Birth: 1950/12/13 ? ? ?Medication/Dose: Eliquis 5mg  bid  for 30 day supply ? ?Covered?: Yes ? ?Tier: 3 Drug ? ?Prescription Coverage Preferred Pharmacy: [place] ? ?Spoke with Person/Company/Phone Number:: Richard Parrish/[employer] PH# [phone] ? ?Co-Pay: $45.00 ? ?Prior Approval: No ? ?Deductible:  (no deductible) ? ?  ? ? ? ?Richard Parrish ?Phone Number: ?05/20/2021, 2:26 PM ? ? ? ? ?

## 2021-05-20 NOTE — Discharge Instructions (Addendum)
Information on my medicine - ELIQUIS? (apixaban) ? ?This medication education was reviewed with me or my healthcare representative as part of my discharge preparation.  The pharmacist that spoke with me during my hospital stay was:  ?Pat Patrick, [facility] ? ?Why was Eliquis? prescribed for you? ?Eliquis? was prescribed to treat blood clots that may have been found in the veins of your legs (deep vein thrombosis) or in your lungs (pulmonary embolism) and to reduce the risk of them occurring again. ? ?What do You need to know about Eliquis? ? ?The starting dose is 10 mg (two 5 mg tablets) taken TWICE daily for the FIRST SEVEN (7) DAYS, then on (enter date)  05/26/21  the dose is reduced to ONE 5 mg tablet taken TWICE daily.  Eliquis? may be taken with or without food.  ? ?Try to take the dose about the same time in the morning and in the evening. If you have difficulty swallowing the tablet whole please discuss with your pharmacist how to take the medication safely. ? ?Take Eliquis? exactly as prescribed and DO NOT stop taking Eliquis? without talking to the doctor who prescribed the medication.  Stopping may increase your risk of developing a new blood clot.  Refill your prescription before you run out. ? ?After discharge, you should have regular check-up appointments with your healthcare provider that is prescribing your Eliquis?. ?   ?What do you do if you miss a dose? ?If a dose of ELIQUIS? is not taken at the scheduled time, take it as soon as possible on the same day and twice-daily administration should be resumed. The dose should not be doubled to make up for a missed dose. ? ?Important Safety Information ?A possible side effect of Eliquis? is bleeding. You should call your healthcare provider right away if you experience any of the following: ?Bleeding from an injury or your nose that does not stop. ?Unusual colored urine (red or dark brown) or unusual colored stools (red or black). ?Unusual bruising for  unknown reasons. ?A serious fall or if you hit your head (even if there is no bleeding). ? ?Some medicines may interact with Eliquis? and might increase your risk of bleeding or clotting while on Eliquis?Marland Kitchen To help avoid this, consult your healthcare provider or pharmacist prior to using any new prescription or non-prescription medications, including herbals, vitamins, non-steroidal anti-inflammatory drugs (NSAIDs) and supplements. ? ?This website has more information on Eliquis? (apixaban): http://www.eliquis.com/eliquis/home  ?

## 2021-05-20 NOTE — Progress Notes (Signed)
? ?NAME:  Richard Parrish, MRN:  [id number], DOB:  [birth year], LOS: 2 ?ADMISSION DATE:  05/18/2021, CONSULTATION DATE:  05/18/2021 ?REFERRING MD:  Gilford Raid, CHIEF COMPLAINT:  Submassive PE   ? ?History of Present Illness:  ?Richard Parrish is a 71 y.o. male with a PMH significant for CAD s/p PCI, HTN, HLD, pulmonary fibrosis, OSA on CPAP, PVD, and anxiety who presented to the ED due to complaints of worsening shortness of breath and weakness (despite recent treatment with Levaquin and prednisone for presumed CAP) ? ?On ED arrival patient was seen mildly hypotensive with blood pressure 89/69 and RR was elevated at 32. CTA chest was preformed and revealed moderate left sided PE with RV/LV ratio of 0.9. abd CT evidence of right heart strain. Given extensive underly pulmonary history with new PE, PCCM was consulted for pulm consult.  ? ?Pertinent  Medical History  ?CAD s/p PCI, HTN, HLD, pulmonary fibrosis, OSA on CPAP, PVD, and anxiety  ? ?Significant Hospital Events: ?Including procedures, antibiotic start and stop dates in addition to other pertinent events   ?3/21 Presented to the ED for complaints of worsening SOB and weakness found to have submassive PE on CTA  ?3/23 patient is on 5L O2 via [place], Echo shows mildly reduced RV function ? ?Interim History / Subjective:  ? ?He remains short of breath. ?Denies fevers, chills or sweats ? ?Objective   ?Blood pressure (!) 148/90, pulse 65, temperature 98.1 ?F (36.7 ?C), temperature source Oral, resp. rate 18, height 6' (1.829 m), weight 94.5 kg, SpO2 95 %. ?   ?   ? ?Intake/Output Summary (Last 24 hours) at 05/20/2021 0834 ?Last data filed at 05/20/2021 0204 ?Gross per 24 hour  ?Intake 0 ml  ?Output --  ?Net 0 ml  ? ?Filed Weights  ? 05/18/21 1700  ?Weight: 94.5 kg  ? ? ?Examination: ?General: elderly male lying in bed in no acute distress ?HEENT: [place]/AT, MM pink/moist, PERRL,  ?Neuro: Alert and oriented x3, nonfocal ?CV: s1s2 regular rate and rhythm, no murmur, rubs, or  gallops,  ?PULM: No increased work of breathing, diminished breath sounds, nonproductive cough ?GI: soft, bowel sounds active in all 4 quadrants, non-tender, non-distended, ?Extremities: warm/dry, no edema  ?Skin: no rashes or lesions ? ?Resolved Hospital Problem list   ? ? ?Assessment & Plan:  ?Submassive PE with CT evidence of right heart strain ?- CTA chest was preformed and revealed moderate left sided PE with RV/LV ratio of 0.9. abd CT evidence of right heart strain ?-PESI score on admit (when hypotensive and tachypnic) was 172 ?Hx of pulmonary fibrosis ?-Follows with Dr. Vaughan Browner. 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 ?- Will need PT eval and ambulatory O2 sats ? ?PCCM to continue to follow ? ? ?Best Practice (right click and "Reselect all SmartList Selections" daily)  ?Per primary  ? ?Labs   ?CBC: ?Recent Labs  ?Lab 05/18/21 ?1438 05/18/21 ?1456 05/19/21 ?0115 05/20/21 ?0143  ?WBC 15.3*  --  12.0* 12.4*  ?NEUTROABS 13.7*  --   --   --   ?HGB 14.5 15.0 13.7  13.1  ?HCT 42.5 44.0 40.1 38.3*  ?MCV 94.0  --  92.8 91.6  ?PLT 579*  --  413* 493*  ? ? ?Basic Metabolic Panel: ?Recent Labs  ?Lab 05/18/21 ?1439 05/18/21 ?1456 05/19/21 ?0115 05/20/21 ?0143  ?NA 136 137 141 137  ?K 3.9 4.0 3.6 3.7  ?CL 102 103 105 106  ?CO2 22  --  25 23  ?GLUCOSE 141* 139* 119* 121*  ?BUN 18 21 14 13   ?CREATININE 0.97 0.80 0.82 0.70  ?CALCIUM 9.2  --  9.1 8.9  ?MG 2.0  --   --  2.2  ? ?GFR: ?Estimated Creatinine Clearance: 99.6 mL/min (by C-G formula based on SCr of 0.7 mg/dL). ?Recent Labs  ?Lab 05/18/21 ?1438  05/18/21 ?1649 05/18/21 ?2041 05/19/21 ?0115 05/20/21 ?0143  ?PROCALCITON  --   --   --  <0.10  --   ?WBC 15.3*  --   --  12.0* 12.4*  ?LATICACIDVEN  --  1.5 1.8  --   --   ? ? ?Liver Function Tests: ?Recent Labs  ?Lab 05/18/21 ?1439  ?AST 33  ?ALT 29  ?ALKPHOS 95  ?BILITOT 1.0  ?PROT 6.8  ?ALBUMIN 3.1*  ? ?No results for input(s): LIPASE, AMYLASE in the last 168 hours. ?No results for input(s): AMMONIA in the last 168 hours. ? ?ABG ?   ?Component Value Date/Time  ? PHART 7.402 03/02/2021 1124  ? PCO2ART 38.9 03/02/2021 1124  ? PO2ART 102 03/02/2021 1124  ? HCO3 23.7 03/02/2021 1124  ? TCO2 25 05/18/2021 1456  ? ACIDBASEDEF 0.5 03/02/2021 1124  ? O2SAT 97.6 03/02/2021 1124  ?  ? ?Coagulation Profile: ?No results for input(s): INR, PROTIME in the last 168 hours. ? ?Cardiac Enzymes: ?No results for input(s): CKTOTAL, CKMB, CKMBINDEX, TROPONINI in the last 168 hours. ? ?HbA1C: ?Hgb A1c MFr Bld  ?Date/Time Value Ref Range Status  ?10/03/2020 07:49 PM 5.3 4.8 - 5.6 % Final  ?  Comment:  ?  (NOTE) ?Pre diabetes:          5.7%-6.4% ? ?Diabetes:              >6.4% ? ?Glycemic control for   <7.0% ?adults with diabetes ?  ?01/08/2018 08:21 AM 5.1 4.8 - 5.6 % Final  ?  Comment:  ?           Prediabetes: 5.7 - 6.4 ?         Diabetes: >6.4 ?         Glycemic control for adults with diabetes: <7.0 ?  ? ? ?CBG: ?No results for input(s): GLUCAP in the last 168 hours. ? ? ? ?Critical care time: n/a ?  ? ?Freda Jackson, MD ?[facility] ?Office: [phone] ? ? ?See Amion for personal pager ?PCCM on call pager [phone] until 7pm. ?Please call Elink 7p-7a. [phone] ? ? ? ? ? ? ? ?

## 2021-05-20 NOTE — Plan of Care (Signed)
  Problem: Clinical Measurements: Goal: Respiratory complications will improve Outcome: Not Progressing   

## 2021-05-20 NOTE — Progress Notes (Signed)
?PROGRESS NOTE ? ? ? ?Richard Parrish  LFY:[id number] DOB: 02/28/1951 DOA: 05/18/2021 ?PCP: Seward Carol, MD  ? 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  About a month ago patient was diagnosed with pneumothorax requiring chest tube complicated by pneumonia and eventually discharged on 7 days of Augmentin.  When he saw pulmonary about a week ago he was given a course of prednisone, Z-Pak and later switched to Levaquin.  Last day of Levaquin 05/20/2021.   ? ?Upon this admission he was diagnosed with submassive PE with right heart strain, seen by pulmonary team.  Currently on heparin drip not a candidate for thrombolytics.  Lower extremity Dopplers is positive for left peroneal DVT.  Heparin drip transition to Eliquis.  Echocardiogram overall unremarkable with EF of 65% and normal RV. ? ? ?Assessment & Plan: ? Principal Problem: ?  Pulmonary embolism ([place]) ?Active Problems: ?  Acute respiratory failure with hypoxia ([place]) ?  Pulmonary fibrosis ([place]) ?  Malignant neoplasm of upper lobe of right lung ([place]) ?  Essential hypertension, benign ?  CAD (coronary artery disease), native coronary artery ?  Anxiety ?  Low back pain ?  HLD (hyperlipidemia) ?  BPH (benign prostatic hyperplasia) ?  ? ? ?Assessment and Plan: ?* Pulmonary embolism ([place]) ?Secondary to submassive PE, continue supplemental oxygen.  CT showing right heart strain, echocardiogram shows normal EF and RV. ?Lower extremity Dopplers-positive for left peroneal vein DVT. ?Pain medication ?Heparin drip transitioned to Eliquis ? ? ?Acute respiratory failure with hypoxia ([place]) ?Secondary to submassive PE and pulmonary fibrosis.  Not on oxygen at home.  Still on 4-5 L nasal cannula getting significant short of breath with movement ?Now  on Eliquis twice daily ? ? ? ? ?Pulmonary fibrosis ([place]) ?Followed outpatient by Dr. Vaughan Browner.  Completed 5-day course of prednisone upon admission.  Currently on Levaquin, last day 3/23. ?Seen by pulmonary team-appreciate recs ? ? ?Malignant neoplasm of upper lobe of right lung ([place]) ?RUL adenocarcinoma of the lung s/p bronchoscopy and robotic resection on 03/03/21 by Dr. Kipp Brood and Dr. Valeta Harms later complicated by large pneumothorax on 2/14 requiring chest tube and developed pneumonia.  Eventually discharged on Augmentin ? ? ? ?Essential hypertension, benign ?Initially soft blood pressure therefore metoprolol and Imdur on hold. ?Adjust further meds as necessary ? ? ?CAD (coronary artery disease), native coronary artery ?CAD s/p CABG; continue atorvastatin, Plavix ?Holding Toprol in setting of hypotension, resume when appropriate ? ?Anxiety ?Continue paxil and xanax prn  ? ?Low back pain ?Continue prn norco and gabapentin ? ?HLD (hyperlipidemia) ?Continue lipitor 80mg   ? ?BPH (benign prostatic hyperplasia) ?Continue Flomax daily ? ? ? ? ? ? ?  ? ? ? ?DVT prophylaxis: Eliquis ?Code Status: Full ?Family Communication: Wife is at bedside ? ?Status is: Inpatient ?Remains inpatient appropriate because: On going work up for submassive PE, still has exertional SOB. Remains on anticoagulation ? ? ? ? ? ?Subjective: ?Still getting significant amount of shortness of breath even with getting out of bed.  Remains on 4-5 L nasal cannula. ? ? ?Examination: ? ?General exam: Appears calm and comfortable, 4 L nasal cannula ?Respiratory system: Clear to auscultation. Respiratory effort normal. ?Cardiovascular system: S1 & S2 heard, RRR. No JVD, murmurs, rubs, gallops or clicks. No pedal edema. ?Gastrointestinal system: Abdomen is nondistended,  soft and nontender. No organomegaly or masses felt. Normal bowel sounds heard. ?Central nervous system: Alert and oriented. No focal neurological deficits. ?Extremities: Symmetric 5 x 5  power. ?Skin: No rashes, lesions or ulcers ?Psychiatry: Judgement and insight appear normal. Mood & affect appropriate.  ? ? ? ?Objective: ?Vitals:  ? 05/20/21 0338 05/20/21 0450 05/20/21 0738 05/20/21 1120  ?BP: (!) 170/99 (!) 156/89 (!) 148/90 (!) 146/85  ?Pulse: 78  65 80  ?Resp: (!) 21 16 18 20   ?Temp: 97.7 ?F (36.5 ?C)  98.1 ?F (36.7 ?C) 97.6 ?F (36.4 ?C)  ?TempSrc: Oral  Oral Oral  ?SpO2: 94%  95% 99%  ?Weight:      ?Height:      ? ? ?Intake/Output Summary (Last 24 hours) at 05/20/2021 1154 ?Last data filed at 05/20/2021 [phone] ?Gross per 24 hour  ?Intake 0 ml  ?Output 225 ml  ?Net -225 ml  ? ?Filed Weights  ? 05/18/21 1700  ?Weight: 94.5 kg  ? ? ? ?Data Reviewed:  ? ?CBC: ?Recent Labs  ?Lab 05/18/21 ?1438 05/18/21 ?1456 05/19/21 ?0115 05/20/21 ?0143  ?WBC 15.3*  --  12.0* 12.4*  ?NEUTROABS 13.7*  --   --   --   ?HGB 14.5 15.0 13.7 13.1  ?HCT 42.5 44.0 40.1 38.3*  ?MCV 94.0  --  92.8 91.6  ?PLT 579*  --  413* 493*  ? ?Basic Metabolic Panel: ?Recent Labs  ?Lab 05/18/21 ?1439 05/18/21 ?1456 05/19/21 ?0115 05/20/21 ?0143  ?NA 136 137 141 137  ?K 3.9 4.0 3.6 3.7  ?CL 102 103 105 106  ?CO2 22  --  25 23  ?GLUCOSE 141* 139* 119* 121*  ?BUN 18 21 14 13   ?CREATININE 0.97 0.80 0.82 0.70  ?CALCIUM 9.2  --  9.1 8.9  ?MG 2.0  --   --  2.2  ? ?GFR: ?Estimated Creatinine Clearance: 99.6 mL/min (by C-G formula based on SCr of 0.7 mg/dL). ?Liver Function Tests: ?Recent Labs  ?Lab 05/18/21 ?1439  ?AST 33  ?ALT 29  ?ALKPHOS 95  ?BILITOT 1.0  ?PROT 6.8  ?ALBUMIN 3.1*  ? ?No results for input(s): LIPASE, AMYLASE in the last 168 hours. ?No results for input(s): AMMONIA in the last 168 hours. ?Coagulation Profile: ?No results for input(s): INR, PROTIME in the last 168 hours. ?Cardiac Enzymes: ?No results for input(s): CKTOTAL, CKMB, CKMBINDEX, TROPONINI in the last 168 hours. ?BNP (last 3 results) ?No results for input(s): PROBNP in the last 8760 hours. ?HbA1C: ?No results for input(s): HGBA1C in the last 72 hours. ?CBG: ?No results  for input(s): GLUCAP in the last 168 hours. ?Lipid Profile: ?No results for input(s): CHOL, HDL, LDLCALC, TRIG, CHOLHDL, LDLDIRECT in the last 72 hours. ?Thyroid Function Tests: ?No results for input(s): TSH, T4TOTAL, FREET4, T3FREE, THYROIDAB in the last 72 hours. ?Anemia Panel: ?No results for input(s): VITAMINB12, FOLATE, FERRITIN, TIBC, IRON, RETICCTPCT in the last 72 hours. ?Sepsis Labs: ?Recent Labs  ?Lab 05/18/21 ?1649 05/18/21 ?2041 05/19/21 ?0115  ?PROCALCITON  --   --  <0.10  ?LATICACIDVEN 1.5 1.8  --   ? ? ?Recent Results (from the past 240 hour(s))  ?Blood culture (routine x 2)     Status: None (Preliminary result)  ? Collection Time: 05/18/21  2:40 PM  ? Specimen: Right Antecubital; Blood  ?Result Value Ref Range Status  ? Specimen Description RIGHT ANTECUBITAL  Final  ? Special Requests   Final  ?  BOTTLES DRAWN AEROBIC AND ANAEROBIC Blood Culture adequate volume  ?  Culture   Final  ?  NO GROWTH 2 DAYS ?Performed at [facility], [place] [address] [place], [place] [address] ?  ? Report Status PENDING  Incomplete  ?Resp Panel by RT-PCR (Flu A&B, Covid) Nasopharyngeal Swab     Status: None  ? Collection Time: 05/18/21  2:40 PM  ? Specimen: Nasopharyngeal Swab; Nasopharyngeal(NP) swabs in vial transport medium  ?Result Value Ref Range Status  ? SARS Coronavirus 2 by RT PCR NEGATIVE NEGATIVE Final  ?  Comment: (NOTE) ?SARS-CoV-2 target nucleic acids are NOT DETECTED. ? ?The SARS-CoV-2 RNA is generally detectable in upper respiratory ?specimens during the acute phase of infection. The lowest ?concentration of SARS-CoV-2 viral copies this assay can detect is ?138 copies/mL. A negative result does not preclude SARS-Cov-2 ?infection and should not be used as the sole basis for treatment or ?other patient management decisions. A negative result may occur with  ?improper specimen collection/handling, submission of specimen other ?than nasopharyngeal swab, presence of viral mutation(s) within the ?areas  targeted by this assay, and inadequate number of viral ?copies(<138 copies/mL). A negative result must be combined with ?clinical observations, patient history, and epidemiological ?information. The expected result

## 2021-05-21 ENCOUNTER — Telehealth: Payer: Self-pay | Admitting: Pulmonary Disease

## 2021-05-21 DIAGNOSIS — J9601 Acute respiratory failure with hypoxia: Secondary | ICD-10-CM | POA: Diagnosis not present

## 2021-05-21 DIAGNOSIS — N400 Enlarged prostate without lower urinary tract symptoms: Secondary | ICD-10-CM | POA: Diagnosis not present

## 2021-05-21 DIAGNOSIS — I2693 Single subsegmental pulmonary embolism without acute cor pulmonale: Secondary | ICD-10-CM | POA: Diagnosis not present

## 2021-05-21 DIAGNOSIS — F419 Anxiety disorder, unspecified: Secondary | ICD-10-CM | POA: Diagnosis not present

## 2021-05-21 LAB — CBC
HCT: 40.1 % (ref 39.0–52.0)
Hemoglobin: 13.8 g/dL (ref 13.0–17.0)
MCH: 31.7 pg (ref 26.0–34.0)
MCHC: 34.4 g/dL (ref 30.0–36.0)
MCV: 92 fL (ref 80.0–100.0)
Platelets: 557 10*3/uL — ABNORMAL HIGH (ref 150–400)
RBC: 4.36 MIL/uL (ref 4.22–5.81)
RDW: 14.1 % (ref 11.5–15.5)
WBC: 14.9 10*3/uL — ABNORMAL HIGH (ref 4.0–10.5)
nRBC: 0 % (ref 0.0–0.2)

## 2021-05-21 LAB — BASIC METABOLIC PANEL
Anion gap: 9 (ref 5–15)
BUN: 18 mg/dL (ref 8–23)
CO2: 25 mmol/L (ref 22–32)
Calcium: 9.2 mg/dL (ref 8.9–10.3)
Chloride: 103 mmol/L (ref 98–111)
Creatinine, Ser: 1 mg/dL (ref 0.61–1.24)
GFR, Estimated: >60 mL/min (ref >60)
Glucose, Bld: 133 mg/dL — ABNORMAL HIGH (ref 70–99)
Potassium: 4 mmol/L (ref 3.5–5.1)
Sodium: 137 mmol/L (ref 135–145)

## 2021-05-21 LAB — MAGNESIUM: Magnesium: 2 mg/dL (ref 1.7–2.4)

## 2021-05-21 LAB — SEDIMENTATION RATE: Sed Rate: 40 mm/hr — ABNORMAL HIGH (ref 0–16)

## 2021-05-21 MED ORDER — SULFAMETHOXAZOLE-TRIMETHOPRIM 400-80 MG PO TABS
1.0000 | ORAL_TABLET | Freq: Every day | ORAL | Status: DC
  Administered 2021-05-22 – 2021-05-25 (×4): 1 via ORAL
  Filled 2021-05-21 (×5): qty 1

## 2021-05-21 MED ORDER — METOPROLOL SUCCINATE ER 25 MG PO TB24
25.0000 mg | ORAL_TABLET | Freq: Every day | ORAL | Status: DC
Start: 2021-05-21 — End: 2021-05-27
  Administered 2021-05-21 – 2021-05-26 (×6): 25 mg via ORAL
  Filled 2021-05-21 (×6): qty 1

## 2021-05-21 MED ORDER — SODIUM CHLORIDE 0.9 % IV SOLN: INTRAVENOUS | Status: AC

## 2021-05-21 MED ORDER — PREDNISONE 20 MG PO TABS
60.0000 mg | ORAL_TABLET | Freq: Every day | ORAL | Status: DC
  Administered 2021-05-24 – 2021-05-25 (×2): 60 mg via ORAL
  Filled 2021-05-21 (×2): qty 3

## 2021-05-21 NOTE — Telephone Encounter (Signed)
Patient is scheduled on 06/03/2021 at 10am with Geraldo Pitter, NP- reminder mailed, nothing further needed.  ?

## 2021-05-21 NOTE — Progress Notes (Signed)
?  Transition of Care (TOC) Screening Note ? ? ?Patient Details  ?Name: Richard Parrish ?Date of Birth: 1951/02/26 ? ? ?Transition of Care (TOC) CM/SW Contact:    ?Dahlia Client, Romeo Rabon, RN ?Phone Number: ?05/21/2021, 2:02 PM ? ? ? ?Transition of Care Department [facility]) has reviewed patient and note PT recommendations for SNF- will follow up for SNF placement and workup. We will continue to monitor patient advancement through interdisciplinary progression rounds. If new patient transition needs arise, please place a TOC consult. ?  ?

## 2021-05-21 NOTE — Progress Notes (Signed)
Patient told this RN that he wanted someone by him when he stood to urinate. Patient stated that he has been getting "dizzy" when he stands.  ? ?Offered patient condom cath and patient wanted to wait until morning.  Will pass along to dayshift.  ?

## 2021-05-21 NOTE — Care Management Important Message (Signed)
Important Message ? ?Patient Details  ?Name: Richard Parrish ?MRN: [id number] ?Date of Birth: 17-Nov-1950 ? ? ?Medicare Important Message Given:  Yes ? ? ? ? ?Levada Dy  Willodene Stallings-Martin ?05/21/2021, 2:41 PM ?

## 2021-05-21 NOTE — Evaluation (Signed)
Occupational Therapy Evaluation Patient Details Name: Richard Parrish MRN: [id number] DOB: [birth year] Today's Date: 05/21/2021   History of Present Illness Richard Parrish is a 71 year old male admitted 05/18/21 admitted to the hospital for worsening SOB, weakness over the past week. Dx with submassive PE with right heart strain; left peroneal DVT (on heparin switched to Eliquis) Of note: About a month ago patient was diagnosed with pneumothorax requiring chest tube complicated by pneumonia. 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   Clinical Impression   Pt is typically mod I for ADL and transfers with RW. Today he is continue to be limited by symptomatic orthostatic pressures. He was in chair, able to demonstrate seated LB dressing at min A, set up for grooming activities. Pt does get SOB with activity (even seated activity) and requires supplemental O2 and frequent seated rest breaks. Pt stood from recliner and could not maintain standing long enough for BP to complete taking. In that time BP dropped significantly. Initial sitting BP; 142/93 (109)  Standing BP: 83/58 (65) Dropped to 84% on 4L with standing, returns to >90% with seated DOE 2/4 At this time recommending HHOT post-acute to maximize safety and independence in ADL and functional transfers. Pt will benefit from continued skilled OT in the acute setting with focus on energy conservation and OOB activity tolerance (the balance of the two), balance, safe use of DME and AE. Wife present throughout and very sweet and helpful. Pt loves to share stories.     Recommendations for follow up therapy are one component of a multi-disciplinary discharge planning process, led by the attending physician.  Recommendations may be updated based on patient status, additional functional criteria and insurance authorization.   Follow Up  Recommendations  Home health OT    Assistance Recommended at Discharge Frequent or constant Supervision/Assistance  Patient can return home with the following A little help with walking and/or transfers;A little help with bathing/dressing/bathroom;Assistance with cooking/housework;Assist for transportation;Help with stairs or ramp for entrance    Functional Status Assessment  Patient has had a recent decline in their functional status and demonstrates the ability to make significant improvements in function in a reasonable and predictable amount of time.  Equipment Recommendations  BSC/3in1    Recommendations for Other Services PT consult     Precautions / Restrictions Precautions Precautions: Fall;Other (comment) Precaution Comments: watch 02 and BP (orthostatic on eval) Restrictions Weight Bearing Restrictions: No      Mobility Bed Mobility               General bed mobility comments: OOB in recliner at beginning and end of session    Transfers Overall transfer level: Needs assistance Equipment used: Rolling walker (2 wheels) Transfers: Sit to/from Stand Sit to Stand: Min assist (to hold RW grounded)           General transfer comment: Pt symptomatic for orthostatic. vc for safety as Pt wants to pull up on RW      Balance Overall balance assessment: Needs assistance Sitting-balance support: Feet supported, No upper extremity supported Sitting balance-Leahy Scale: Good Sitting balance - Comments: Dyspneic sitting EOB   Standing balance support: During functional activity, Reliant on assistive device for balance Standing balance-Leahy Scale: Poor Standing balance comment: Min guard A due to lightheadedness  ADL either performed or assessed with clinical judgement   ADL Overall ADL's : Needs assistance/impaired Eating/Feeding: Modified independent   Grooming: Set up;Sitting;Oral care;Wash/dry face;Applying  deodorant Grooming Details (indicate cue type and reason): in recliner - unable to perform in standing due to orthostatics Upper Body Bathing: Minimal assistance;Sitting   Lower Body Bathing: Minimal assistance;Sitting/lateral leans   Upper Body Dressing : Set up;Sitting Upper Body Dressing Details (indicate cue type and reason): donning gown Lower Body Dressing: Minimal assistance;Sitting/lateral leans Lower Body Dressing Details (indicate cue type and reason): able to don R sock, assist for L Toilet Transfer: Minimal assistance;Stand-pivot;Rolling walker (2 wheels) Toilet Transfer Details (indicate cue type and reason): pt orthostatic Toileting- Clothing Manipulation and Hygiene: Minimal assistance;Sit to/from stand       Functional mobility during ADLs: Minimal assistance;Cueing for safety;Rolling walker (2 wheels) (wants to pull up on RW) General ADL Comments: orthostatics limiting standing ADL tasks     Vision Baseline Vision/History: 1 Wears glasses Ability to See in Adequate Light: 0 Adequate Patient Visual Report: No change from baseline       Perception     Praxis      Pertinent Vitals/Pain Pain Assessment Pain Assessment: No/denies pain     Hand Dominance Right   Extremity/Trunk Assessment Upper Extremity Assessment Upper Extremity Assessment: Overall WFL for tasks assessed   Lower Extremity Assessment Lower Extremity Assessment: Defer to PT evaluation   Cervical / Trunk Assessment Cervical / Trunk Assessment: Other exceptions Cervical / Trunk Exceptions: hx of chronic back pain   Communication Communication Communication: No difficulties   Cognition Arousal/Alertness: Awake/alert Behavior During Therapy: WFL for tasks assessed/performed Overall Cognitive Status: Within Functional Limits for tasks assessed                                 General Comments: HOH so needs repetition.     General Comments  Pt BP initially 142/93 (109) and  dropped to 83/58 (65) Pt unable to maintain standing, on 2L O2 with standing, Pt's SpO2 dropped to 84% - quickly rebounds to 100 in seated position; wife present throughout session    Exercises     Shoulder Instructions      Home Living Family/patient expects to be discharged to:: Private residence Living Arrangements: Spouse/significant other Available Help at Discharge: Family Type of Home: House Home Access: Stairs to enter Secretary/administrator of Steps: 4 Entrance Stairs-Rails: Left Home Layout: One level     Bathroom Shower/Tub: Producer, television/film/video: Standard     Home Equipment: Agricultural consultant (2 wheels);Rollator (4 wheels);BSC/3in1;Shower seat          Prior Functioning/Environment Prior Level of Function : Needs assist             Mobility Comments: USes RW for ambulation, loves to go hunting, play with his dogs and had to quit golfing 1 year ago due to back issues. ADLs Comments: Able to dress/bathe with some assist, no cooking/cleaning        OT Problem List: Decreased activity tolerance;Impaired balance (sitting and/or standing);Decreased knowledge of precautions;Cardiopulmonary status limiting activity      OT Treatment/Interventions: Self-care/ADL training;DME and/or AE instruction;Therapeutic activities;Patient/family education;Balance training    OT Goals(Current goals can be found in the care plan section) Acute Rehab OT Goals Patient Stated Goal: get home OT Goal Formulation: With patient/family Time For Goal Achievement: 06/04/21 Potential to Achieve Goals: Good ADL Goals Pt  Will Perform Grooming: with modified independence;standing Pt Will Perform Upper Body Dressing: with modified independence;sitting Pt Will Perform Lower Body Dressing: with modified independence;sit to/from stand Pt Will Transfer to Toilet: with supervision;ambulating Pt Will Perform Toileting - Clothing Manipulation and hygiene: with supervision;sit to/from  stand Additional ADL Goal #1: Pt will verbalize 3 energy conservation strategies to use during ADL with no cues  OT Frequency: Min 2X/week    Co-evaluation              AM-PAC OT "6 Clicks" Daily Activity     Outcome Measure Help from another person eating meals?: None Help from another person taking care of personal grooming?: A Little Help from another person toileting, which includes using toliet, bedpan, or urinal?: A Little Help from another person bathing (including washing, rinsing, drying)?: A Little Help from another person to put on and taking off regular upper body clothing?: A Little Help from another person to put on and taking off regular lower body clothing?: A Little 6 Click Score: 19   End of Session Equipment Utilized During Treatment: Gait belt;Rolling walker (2 wheels) Nurse Communication: Mobility status;Other (comment) (BP orthostatic)  Activity Tolerance: Other (comment) (limited by orthostatic pressure) Patient left: in chair;with call bell/phone within reach;with chair alarm set;with family/visitor present  OT Visit Diagnosis: Unsteadiness on feet (R26.81);Other abnormalities of gait and mobility (R26.89);Muscle weakness (generalized) (M62.81)                Time: 1610-9604 OT Time Calculation (min): 32 min Charges:  OT General Charges $OT Visit: 1 Visit OT Evaluation $OT Eval Moderate Complexity: 1 Mod OT Treatments $Self Care/Home Management : 8-22 mins  Nyoka Cowden OTR/L Acute Rehabilitation Services Pager: [phone] Office: [phone]  Evern Bio Charleen Madera 05/21/2021, 1:45 PM

## 2021-05-21 NOTE — Progress Notes (Signed)
? ?NAME:  Richard Parrish, MRN:  [id number], DOB:  1950/10/22, LOS: 3 ?ADMISSION DATE:  05/18/2021, CONSULTATION DATE:  05/18/2021 ?REFERRING MD:  Gilford Raid, CHIEF COMPLAINT:  Submassive PE   ? ?History of Present Illness:  ?Richard Parrish is a 71 y.o. male with a PMH significant for CAD s/p PCI, HTN, HLD, pulmonary fibrosis, OSA on CPAP, PVD, and anxiety who presented to the ED due to complaints of worsening shortness of breath and weakness (despite recent treatment with Levaquin and prednisone for presumed CAP) ? ?On ED arrival patient was seen mildly hypotensive with blood pressure 89/69 and RR was elevated at 32. CTA chest was preformed and revealed moderate left sided PE with RV/LV ratio of 0.9. abd CT evidence of right heart strain. Given extensive underly pulmonary history with new PE, PCCM was consulted for pulm consult.  ? ?Pertinent  Medical History  ?CAD s/p PCI, HTN, HLD, pulmonary fibrosis, OSA on CPAP, PVD, and anxiety  ? ?Significant Hospital Events: ?Including procedures, antibiotic start and stop dates in addition to other pertinent events   ?3/21 Presented to the ED for complaints of worsening SOB and weakness found to have submassive PE on CTA  ?3/23 patient is on 5L O2 via [place], Echo shows mildly reduced RV function ? ?Interim History / Subjective:  ? ?He remains short of breath but feeling somewhat better today overall. ? ?Objective   ?Blood pressure (!) 157/87, pulse 80, temperature 98 ?F (36.7 ?C), temperature source Oral, resp. rate 20, height 6' (1.829 m), weight 94.5 kg, SpO2 95 %. ?   ?   ? ?Intake/Output Summary (Last 24 hours) at 05/21/2021 0912 ?Last data filed at 05/21/2021 7829 ?Gross per 24 hour  ?Intake 0 ml  ?Output 550 ml  ?Net -550 ml  ? ?Filed Weights  ? 05/18/21 1700  ?Weight: 94.5 kg  ? ? ?Examination: ?General: elderly male lying in bed in no acute distress ?HEENT: [place]/AT, MM pink/moist, PERRL,  ?Neuro: Alert and oriented x3, nonfocal ?CV: s1s2 regular rate and rhythm, no murmur,  rubs, or gallops,  ?PULM: No increased work of breathing, diminished breath sounds with basilar rales, nonproductive cough ?GI: soft, bowel sounds active in all 4 quadrants, non-tender, non-distended, ?Extremities: warm/dry, no edema  ?Skin: no rashes or lesions ? ?Resolved Hospital Problem list   ? ? ?Assessment & Plan:  ?Submassive PE with CT evidence of right heart strain ?Pulmonary fibrosis with acute flare ?History of right upper lobe adenocarcinoma s/p RUL lobectomy ?Hx of recurrent right pneumothorax, right lower lobe consolidation  ? ?P: ?- Anticoagulation via eliquis ?- Titrate supplemental O2 for goal SpO2 92-95% ?- Continue IV solumedrol 125mg  daily for total of 4 days (last day 3/26)  ?- Transition to 60mg  oral prednisone on 3/27 which he can continue at discharge ?- Will schedule hospital follow up in pulmonary clinic to further taper steroids as outpatient and will need to be started on PJP prophylaxis at that time for extended steroid taper. ?- Will likely need home O2 at this point ?- PT eval and ambulatory O2 sats ? ?PCCM to follow up on Monday, please call if needed over the weekend. ? ? ?Best Practice (right click and "Reselect all SmartList Selections" daily)  ?Per primary  ? ?Labs   ?CBC: ?Recent Labs  ?Lab 05/18/21 ?1438 05/18/21 ?1456 05/19/21 ?0115 05/20/21 ?0143 05/21/21 ?0128  ?WBC 15.3*  --  12.0* 12.4* 14.9*  ?NEUTROABS 13.7*  --   --   --   --   ?  HGB 14.5 15.0 13.7 13.1 13.8  ?HCT 42.5 44.0 40.1 38.3* 40.1  ?MCV 94.0  --  92.8 91.6 92.0  ?PLT 579*  --  413* 493* 557*  ? ? ?Basic Metabolic Panel: ?Recent Labs  ?Lab 05/18/21 ?1439 05/18/21 ?1456 05/19/21 ?0115 05/20/21 ?0143 05/21/21 ?0128  ?NA 136 137 141 137 137  ?K 3.9 4.0 3.6 3.7 4.0  ?CL 102 103 105 106 103  ?CO2 22  --  25 23 25   ?GLUCOSE 141* 139* 119* 121* 133*  ?BUN 18 21 14 13 18   ?CREATININE 0.97 0.80 0.82 0.70 1.00  ?CALCIUM 9.2  --  9.1 8.9 9.2  ?MG 2.0  --   --  2.2 2.0  ? ?GFR: ?Estimated Creatinine Clearance: 79.7 mL/min  (by C-G formula based on SCr of 1 mg/dL). ?Recent Labs  ?Lab 05/18/21 ?1438 05/18/21 ?1649 05/18/21 ?2041 05/19/21 ?0115 05/20/21 ?0143 05/21/21 ?0128  ?PROCALCITON  --   --   --  <0.10  --   --   ?WBC 15.3*  --   --  12.0* 12.4* 14.9*  ?LATICACIDVEN  --  1.5 1.8  --   --   --   ? ? ?Liver Function Tests: ?Recent Labs  ?Lab 05/18/21 ?1439  ?AST 33  ?ALT 29  ?ALKPHOS 95  ?BILITOT 1.0  ?PROT 6.8  ?ALBUMIN 3.1*  ? ?No results for input(s): LIPASE, AMYLASE in the last 168 hours. ?No results for input(s): AMMONIA in the last 168 hours. ? ?ABG ?   ?Component Value Date/Time  ? PHART 7.402 03/02/2021 1124  ? PCO2ART 38.9 03/02/2021 1124  ? PO2ART 102 03/02/2021 1124  ? HCO3 23.7 03/02/2021 1124  ? TCO2 25 05/18/2021 1456  ? ACIDBASEDEF 0.5 03/02/2021 1124  ? O2SAT 97.6 03/02/2021 1124  ?  ? ?Coagulation Profile: ?No results for input(s): INR, PROTIME in the last 168 hours. ? ?Cardiac Enzymes: ?No results for input(s): CKTOTAL, CKMB, CKMBINDEX, TROPONINI in the last 168 hours. ? ?HbA1C: ?Hgb A1c MFr Bld  ?Date/Time Value Ref Range Status  ?10/03/2020 07:49 PM 5.3 4.8 - 5.6 % Final  ?  Comment:  ?  (NOTE) ?Pre diabetes:          5.7%-6.4% ? ?Diabetes:              >6.4% ? ?Glycemic control for   <7.0% ?adults with diabetes ?  ?01/08/2018 08:21 AM 5.1 4.8 - 5.6 % Final  ?  Comment:  ?           Prediabetes: 5.7 - 6.4 ?         Diabetes: >6.4 ?         Glycemic control for adults with diabetes: <7.0 ?  ? ? ?CBG: ?No results for input(s): GLUCAP in the last 168 hours. ? ? ? ?Critical care time: n/a ?  ? ?Freda Jackson, MD ?[facility] ?Office: [phone] ? ? ?See Amion for personal pager ?PCCM on call pager [phone] until 7pm. ?Please call Elink 7p-7a. [phone] ? ? ? ? ? ? ? ?

## 2021-05-21 NOTE — Progress Notes (Signed)
?PROGRESS NOTE ? ? ? ?Richard Parrish  TKZ:[id number] DOB: April 05, 1950 DOA: 05/18/2021 ?PCP: Seward Carol, MD  ? 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  About a month ago patient was diagnosed with pneumothorax requiring chest tube complicated by pneumonia and eventually discharged on 7 days of Augmentin.  When he saw pulmonary about a week ago he was given a course of prednisone, Z-Pak and later switched to Levaquin.  Last day of Levaquin 05/20/2021.   ? ?Upon this admission he was diagnosed with submassive PE with right heart strain, seen by pulmonary team.  Currently on heparin drip not a candidate for thrombolytics.  Lower extremity Dopplers is positive for left peroneal DVT.  Heparin drip transition to Eliquis.  Echocardiogram overall unremarkable with EF of 65% and normal RV. ? ? ?Assessment & Plan: ? Principal Problem: ?  Pulmonary embolism ([place]) ?Active Problems: ?  Acute respiratory failure with hypoxia ([place]) ?  Pulmonary fibrosis ([place]) ?  Malignant neoplasm of upper lobe of right lung ([place]) ?  Essential hypertension, benign ?  CAD (coronary artery disease), native coronary artery ?  Anxiety ?  Low back pain ?  HLD (hyperlipidemia) ?  BPH (benign prostatic hyperplasia) ?  ? ? ?Assessment and Plan: ?* Pulmonary embolism ([place]) ?Secondary to submassive PE, continue supplemental oxygen.  CT showing right heart strain, echocardiogram shows normal EF and RV. ?Lower extremity Dopplers-positive for left peroneal vein DVT. ?Pain medication ?Heparin drip transitioned to Eliquis ? ? ?Acute respiratory failure with hypoxia ([place]) ?Secondary to submassive PE and pulmonary fibrosis.  Along with complicated pulmonary history still requiring 4-5 L nasal cannula.  Anticipate will require some  form of oxygen at home.  But at this time he still getting short of breath with minimal mobility. ?Continue Eliquis twice daily ? ? ? ? ?Pulmonary fibrosis ([place]) ?Followed outpatient by Dr. Vaughan Browner.  Pulmonary team following completed course of prednisone and Levaquin.  But due to concerns of ILD flare, started on Solu-Medrol. ? ? ?Malignant neoplasm of upper lobe of right lung ([place]) ?RUL adenocarcinoma of the lung s/p bronchoscopy and robotic resection on 03/03/21 by Dr. Kipp Brood and Dr. Valeta Harms later complicated by large pneumothorax on 2/14 requiring chest tube and developed pneumonia.  Eventually discharged on Augmentin ? ? ? ?Essential hypertension, benign ?Resume Toprol and later Imdur if BP allows.  ? ? ?CAD (coronary artery disease), native coronary artery ?CAD s/p CABG; continue atorvastatin, Plavix ?Resume Toprol ? ?Anxiety ?Continue paxil and xanax prn  ? ?Low back pain ?Continue prn norco and gabapentin ? ?HLD (hyperlipidemia) ?Continue lipitor 80mg   ? ?BPH (benign prostatic hyperplasia) ?Continue Flomax daily ? ? ? ? ? ? ?  ? ? ? ?DVT prophylaxis: Eliquis ?Code Status: Full ?Family Communication: None at bedside this morning ? ?Status is: Inpatient ?Still getting quite short of breath with minimal movement. ? ?Subjective: ?Seen and examined at bedside.  During my evaluation I was able to turn down his oxygen to 3 L nasal cannula saturating 92% but when he was asked to move around for my physical evaluation he immediately desaturated down to 77% requiring me to place him back again on 5 L nasal cannula. ?Symptomatically he still states he does not feel any different and still feels short of breath. ? ?Examination: ? ?Constitutional:  Not in acute distress, 4 L nasal cannula ?Respiratory: Clear to auscultation bilaterally ?Cardiovascular: Normal sinus rhythm, no rubs ?Abdomen: Nontender nondistended good bowel sounds ?Musculoskeletal: No edema noted ?Skin: No rashes seen ?Neurologic: CN 2-12 grossly intact.   And nonfocal ?Psychiatric: Normal judgment and insight. Alert and oriented x 3. Normal mood. ? ?Objective: ?Vitals:  ? 05/20/21 1532 05/20/21 2006 05/20/21 2334 05/21/21 0430  ?BP: (!) 142/99 (!) 144/96 (!) 159/91 (!) 157/90  ?Pulse: 99 100 82 72  ?Resp: (!) 24 20 20 20   ?Temp: 97.7 ?F (36.5 ?C) 99.2 ?F (37.3 ?C) 99 ?F (37.2 ?C) 97.9 ?F (36.6 ?C)  ?TempSrc: Oral Oral Oral Oral  ?SpO2: 96% 94% 100% 97%  ?Weight:      ?Height:      ? ? ?Intake/Output Summary (Last 24 hours) at 05/21/2021 0743 ?Last data filed at 05/21/2021 0216 ?Gross per 24 hour  ?Intake 0 ml  ?Output 475 ml  ?Net -475 ml  ? ?Filed Weights  ? 05/18/21 1700  ?Weight: 94.5 kg  ? ? ? ?Data Reviewed:  ? ?CBC: ?Recent Labs  ?Lab 05/18/21 ?1438 05/18/21 ?1456 05/19/21 ?0115 05/20/21 ?0143 05/21/21 ?0128  ?WBC 15.3*  --  12.0* 12.4* 14.9*  ?NEUTROABS 13.7*  --   --   --   --   ?HGB 14.5 15.0 13.7 13.1 13.8  ?HCT 42.5 44.0 40.1 38.3* 40.1  ?MCV 94.0  --  92.8 91.6 92.0  ?PLT 579*  --  413* 493* 557*  ? ?Basic Metabolic Panel: ?Recent Labs  ?Lab 05/18/21 ?1439 05/18/21 ?1456 05/19/21 ?0115 05/20/21 ?0143 05/21/21 ?0128  ?NA 136 137 141 137 137  ?K 3.9 4.0 3.6 3.7 4.0  ?CL 102 103 105 106 103  ?CO2 22  --  25 23 25   ?GLUCOSE 141* 139* 119* 121* 133*  ?BUN 18 21 14 13 18   ?CREATININE 0.97 0.80 0.82 0.70 1.00  ?CALCIUM 9.2  --  9.1 8.9 9.2  ?MG 2.0  --   --  2.2 2.0  ? ?GFR: ?Estimated Creatinine Clearance: 79.7 mL/min (by C-G formula based on SCr of 1 mg/dL). ?Liver Function Tests: ?Recent Labs  ?Lab 05/18/21 ?1439  ?AST 33  ?ALT 29  ?ALKPHOS 95  ?BILITOT 1.0  ?PROT 6.8  ?ALBUMIN 3.1*  ? ?No results for input(s): LIPASE, AMYLASE in the last 168 hours. ?No results for input(s): AMMONIA in the last 168 hours. ?Coagulation Profile: ?No results for input(s): INR, PROTIME in the last 168 hours. ?Cardiac Enzymes: ?No results for input(s): CKTOTAL, CKMB, CKMBINDEX, TROPONINI in the last 168 hours. ?BNP (last 3 results) ?No results for input(s): PROBNP in the last 8760  hours. ?HbA1C: ?No results for input(s): HGBA1C in the last 72 hours. ?CBG: ?No results for input(s): GLUCAP in the last 168 hours. ?Lipid Profile: ?No results for input(s): CHOL, HDL, LDLCALC, TRIG, CHOLHDL, LDLDIRECT in the last 72 hours. ?Thyroid Function Tests: ?No results for input(s): TSH, T4TOTAL, FREET4, T3FREE, THYROIDAB in the last 72 hours. ?Anemia Panel: ?No results for input(s): VITAMINB12, FOLATE, FERRITIN, TIBC, IRON, RETICCTPCT in the last 72 hours. ?Sepsis Labs: ?Recent Labs  ?Lab 05/18/21 ?1649 05/18/21 ?2041 05/19/21 ?0115  ?PROCALCITON  --   --  <0.10  ?LATICACIDVEN 1.5 1.8  --   ? ? ?Recent Results (from the past 240 hour(s))  ?Blood culture (routine x 2)     Status: None (Preliminary result)  ? Collection Time: 05/18/21  2:40 PM  ? Specimen: Right Antecubital; Blood  ?Result Value Ref Range  Status  ? Specimen Description RIGHT ANTECUBITAL  Final  ? Special Requests   Final  ?  BOTTLES DRAWN AEROBIC AND ANAEROBIC Blood Culture adequate volume  ? Culture   Final  ?  NO GROWTH 3 DAYS ?Performed at [facility], [place] [address] [place], [place] [address] ?  ? Report Status PENDING  Incomplete  ?Resp Panel by RT-PCR (Flu A&B, Covid) Nasopharyngeal Swab     Status: None  ? Collection Time: 05/18/21  2:40 PM  ? Specimen: Nasopharyngeal Swab; Nasopharyngeal(NP) swabs in vial transport medium  ?Result Value Ref Range Status  ? SARS Coronavirus 2 by RT PCR NEGATIVE NEGATIVE Final  ?  Comment: (NOTE) ?SARS-CoV-2 target nucleic acids are NOT DETECTED. ? ?The SARS-CoV-2 RNA is generally detectable in upper respiratory ?specimens during the acute phase of infection. The lowest ?concentration of SARS-CoV-2 viral copies this assay can detect is ?138 copies/mL. A negative result does not preclude SARS-Cov-2 ?infection and should not be used as the sole basis for treatment or ?other patient management decisions. A negative result may occur with  ?improper specimen collection/handling, submission of  specimen other ?than nasopharyngeal swab, presence of viral mutation(s) within the ?areas targeted by this assay, and inadequate number of viral ?copies(<138 copies/mL). A negative result must be combined wit

## 2021-05-21 NOTE — Plan of Care (Signed)
?  Problem: Health Behavior/Discharge Planning: ?Goal: Ability to manage health-related needs will improve ?Outcome: Not Progressing ?  ?Problem: Clinical Measurements: ?Goal: Will remain free from infection ?Outcome: Not Progressing ?Goal: Respiratory complications will improve ?Outcome: Not Progressing ?  ?

## 2021-05-21 NOTE — Telephone Encounter (Signed)
Please schedule patient for hospital follow up for ILD flare with an APP or Dr. Vaughan Browner the first week of April. ? ?Thank you, ?JD ?

## 2021-05-21 NOTE — Evaluation (Signed)
Physical Therapy Evaluation Patient Details Name: Richard Parrish MRN: [id number] DOB: [birth year] Today's Date: 05/21/2021  History of Present Illness  Richard Parrish is a 71 year old male admitted 05/18/21 admitted to the hospital for worsening SOB, weakness over the past week. Dx with submassive PE with right heart strain; left peroneal DVT (on heparin switched to Eliquis) Of note: About a month ago patient was diagnosed with pneumothorax requiring chest tube complicated by pneumonia. 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  Clinical Impression  Patient presents with generalized weakness, impaired balance, decreased activity tolerance, decreased cardiovascular status and impaired mobility s/p above. Pt lives at home with spouse and reports using RW for ambulation PTA. Today, pt requires Min guard assist for bed mobility and standing/transfers. Ambulation deferred due to positive orthostatic hypotension with pt reporting lightheadedness with standing for 1 minute. See below. Supine BP 136/81 HR 73 bpm Sitting BP 104/73, HR 79 bpm Standing BP 74/49, HR 102 bpm Unable to get BP in standing for 3 minute interval as pt unable to tolerate standing position. Also noted to have short of breath with any change in position as well as in standing and takes a few minutes to recover with cues for pursed lip breathing. Sp02 dropped to 85% on 3L/min 02 [place] with activity. Encouraged OOB to chair for all meals and to work on increasing upright tolerance to help stabilize BP. Will consult mobility tech to work with pt over the weekend once BP stabilizes. Would benefit going to SNF to maximize independence and mobility prior to return home.       Recommendations for follow up therapy are one component of a multi-disciplinary discharge planning process, led by the attending physician.  Recommendations may be  updated based on patient status, additional functional criteria and insurance authorization.  Follow Up Recommendations Skilled nursing-short term rehab (<3 hours/day)    Assistance Recommended at Discharge Intermittent Supervision/Assistance  Patient can return home with the following  Help with stairs or ramp for entrance;A lot of help with walking and/or transfers;A lot of help with bathing/dressing/bathroom;Assistance with cooking/housework    Equipment Recommendations None recommended by PT  Recommendations for Other Services       Functional Status Assessment Patient has had a recent decline in their functional status and demonstrates the ability to make significant improvements in function in a reasonable and predictable amount of time.     Precautions / Restrictions Precautions Precautions: Fall;Other (comment) Precaution Comments: watch 02 and BP (orthostatic on eval) Restrictions Weight Bearing Restrictions: No      Mobility  Bed Mobility Overal bed mobility: Needs Assistance Bed Mobility: Rolling, Sidelying to Sit Rolling: Min guard Sidelying to sit: Min guard, HOB elevated       General bed mobility comments: Use of rail to get to EOB. SOB upon sitting. Took a few mins to recover.    Transfers Overall transfer level: Needs assistance Equipment used: Rolling walker (2 wheels) Transfers: Bed to chair/wheelchair/BSC, Sit to/from Stand Sit to Stand: Min guard   Step pivot transfers: Min guard       General transfer comment: Min guard to stand from EOB x2, lightheadedness noted in standing resulting in need to sit on first attempt. Able to step pivot to chair with RW. Gait deferred due to drop in BP and lightheadedness    Ambulation/Gait  Stairs            Wheelchair Mobility    Modified Rankin (Stroke Patients Only)       Balance Overall balance assessment: Needs assistance Sitting-balance support: Feet supported, No  upper extremity supported Sitting balance-Leahy Scale: Good Sitting balance - Comments: Dyspneic sitting EOB   Standing balance support: During functional activity, Reliant on assistive device for balance Standing balance-Leahy Scale: Poor Standing balance comment: Min guard A due to lightheadedness                             Pertinent Vitals/Pain Pain Assessment Pain Assessment: No/denies pain    Home Living Family/patient expects to be discharged to:: Private residence Living Arrangements: Spouse/significant other Available Help at Discharge: Family Type of Home: House Home Access: Stairs to enter Entrance Stairs-Rails: Left Entrance Stairs-Number of Steps: 4   Home Layout: One level Home Equipment: Agricultural consultant (2 wheels);Rollator (4 wheels);BSC/3in1;Shower seat      Prior Function Prior Level of Function : Needs assist             Mobility Comments: USes RW for ambulation, loves to go hunting, play with his dogs and had to quit golfing 1 year ago due to back issues. ADLs Comments: Able to dress/bathe with some assist, no cooking/cleaning     Hand Dominance   Dominant Hand: Right    Extremity/Trunk Assessment   Upper Extremity Assessment Upper Extremity Assessment: Defer to OT evaluation    Lower Extremity Assessment Lower Extremity Assessment: Generalized weakness (but functional)       Communication   Communication: No difficulties  Cognition Arousal/Alertness: Awake/alert Behavior During Therapy: WFL for tasks assessed/performed Overall Cognitive Status: Within Functional Limits for tasks assessed                                 General Comments: HOH so needs repetition.        General Comments General comments (skin integrity, edema, etc.): Wife present during session. Sp02 dropped to 85% on 3L/min 02  with activity, recovers quickly. Supine BP 136/81 HR 73 bpm, Sitting BP 104/73, HR 79 bpm, Standing BP 74/49, HR  102 bpm.    Exercises     Assessment/Plan    PT Assessment Patient needs continued PT services  PT Problem List Decreased strength;Decreased mobility;Decreased balance;Pain;Cardiopulmonary status limiting activity;Decreased activity tolerance       PT Treatment Interventions Patient/family education;DME instruction;Therapeutic exercise;Gait training;Therapeutic activities;Functional mobility training;Neuromuscular re-education;Balance training;Stair training    PT Goals (Current goals can be found in the Care Plan section)  Acute Rehab PT Goals Patient Stated Goal: to go to rehab and then get home to my dogs PT Goal Formulation: With patient/family Time For Goal Achievement: 06/04/21 Potential to Achieve Goals: Good    Frequency Min 3X/week     Co-evaluation               AM-PAC PT "6 Clicks" Mobility  Outcome Measure Help needed turning from your back to your side while in a flat bed without using bedrails?: A Little Help needed moving from lying on your back to sitting on the side of a flat bed without using bedrails?: A Little Help needed moving to and from a bed to a chair (including a wheelchair)?: A Little Help needed standing up from a chair using your arms (e.g., wheelchair or bedside chair)?:  A Little Help needed to walk in hospital room?: A Little Help needed climbing 3-5 steps with a railing? : A Lot 6 Click Score: 17    End of Session Equipment Utilized During Treatment: Oxygen;Gait belt Activity Tolerance: Treatment limited secondary to medical complications (Comment) (orthostatic hypotension) Patient left: in chair;with call bell/phone within reach;with family/visitor present Nurse Communication: Mobility status;Other (comment) (BP drop) PT Visit Diagnosis: Muscle weakness (generalized) (M62.81);Difficulty in walking, not elsewhere classified (R26.2)    Time: 2595-6387 PT Time Calculation (min) (ACUTE ONLY): 49 min   Charges:   PT Evaluation $PT  Eval Moderate Complexity: 1 Mod PT Treatments $Therapeutic Activity: 23-37 mins        Vale Haven, PT, DPT Acute Rehabilitation Services Pager [phone] Office [phone]     Blake Divine A Lanier Ensign 05/21/2021, 12:42 PM

## 2021-05-22 ENCOUNTER — Encounter (HOSPITAL_BASED_OUTPATIENT_CLINIC_OR_DEPARTMENT_OTHER): Payer: Self-pay

## 2021-05-22 DIAGNOSIS — I2693 Single subsegmental pulmonary embolism without acute cor pulmonale: Secondary | ICD-10-CM | POA: Diagnosis not present

## 2021-05-22 DIAGNOSIS — J9601 Acute respiratory failure with hypoxia: Secondary | ICD-10-CM | POA: Diagnosis not present

## 2021-05-22 DIAGNOSIS — I25110 Atherosclerotic heart disease of native coronary artery with unstable angina pectoris: Secondary | ICD-10-CM | POA: Diagnosis not present

## 2021-05-22 LAB — CBC
HCT: 37.8 % — ABNORMAL LOW (ref 39.0–52.0)
Hemoglobin: 12.7 g/dL — ABNORMAL LOW (ref 13.0–17.0)
MCH: 31.1 pg (ref 26.0–34.0)
MCHC: 33.6 g/dL (ref 30.0–36.0)
MCV: 92.6 fL (ref 80.0–100.0)
Platelets: 526 10*3/uL — ABNORMAL HIGH (ref 150–400)
RBC: 4.08 MIL/uL — ABNORMAL LOW (ref 4.22–5.81)
RDW: 14.1 % (ref 11.5–15.5)
WBC: 15.4 10*3/uL — ABNORMAL HIGH (ref 4.0–10.5)
nRBC: 0 % (ref 0.0–0.2)

## 2021-05-22 LAB — BASIC METABOLIC PANEL
Anion gap: 7 (ref 5–15)
BUN: 15 mg/dL (ref 8–23)
CO2: 25 mmol/L (ref 22–32)
Calcium: 8.8 mg/dL — ABNORMAL LOW (ref 8.9–10.3)
Chloride: 105 mmol/L (ref 98–111)
Creatinine, Ser: 0.74 mg/dL (ref 0.61–1.24)
GFR, Estimated: >60 mL/min (ref >60)
Glucose, Bld: 136 mg/dL — ABNORMAL HIGH (ref 70–99)
Potassium: 4.4 mmol/L (ref 3.5–5.1)
Sodium: 137 mmol/L (ref 135–145)

## 2021-05-22 LAB — MAGNESIUM: Magnesium: 2 mg/dL (ref 1.7–2.4)

## 2021-05-22 MED ORDER — MORPHINE SULFATE (PF) 2 MG/ML IV SOLN
2.0000 mg | INTRAVENOUS | Status: AC | PRN
  Administered 2021-05-22: 2 mg via INTRAVENOUS
  Filled 2021-05-22: qty 1

## 2021-05-22 NOTE — Progress Notes (Signed)
?PROGRESS NOTE ? ? ? ?Richard Parrish  GEZ:[id number] DOB: 06/13/1950 DOA: 05/18/2021 ?PCP: Seward Carol, MD  ? 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  About a month ago patient was diagnosed with pneumothorax requiring chest tube complicated by pneumonia and eventually discharged on 7 days of Augmentin.  When he saw pulmonary about a week ago he was given a course of prednisone, Z-Pak and later switched to Levaquin.  Last day of Levaquin 05/20/2021.   ? ?Upon this admission he was diagnosed with submassive PE with right heart strain, seen by pulmonary team.  Currently on heparin drip not a candidate for thrombolytics.  Lower extremity Dopplers is positive for left peroneal DVT.  Heparin drip transition to Eliquis.  Echocardiogram overall unremarkable with EF of 65% and normal RV.  Patient was also started on IV Solu-Medrol for ILD flare. ? ? ?Assessment & Plan: ? Principal Problem: ?  Pulmonary embolism ([place]) ?Active Problems: ?  Acute respiratory failure with hypoxia ([place]) ?  Pulmonary fibrosis ([place]) ?  Malignant neoplasm of upper lobe of right lung ([place]) ?  Essential hypertension, benign ?  CAD (coronary artery disease), native coronary artery ?  Anxiety ?  Low back pain ?  HLD (hyperlipidemia) ?  BPH (benign prostatic hyperplasia) ?  ? ? ?Assessment and Plan: ?* Pulmonary embolism ([place]) ?Secondary to submassive PE, continue supplemental oxygen.  CT showing right heart strain, echocardiogram shows normal EF and RV. ?Lower extremity Dopplers-positive for left peroneal vein DVT. ?Pain medication ?Continue Eliquis ? ? ?Acute respiratory failure with hypoxia ([place]) ?Secondary to submassive PE and pulmonary fibrosis.  Along with complicated pulmonary history still requiring 3-4 L nasal  cannula.  Anticipate will require some form of oxygen at home.  But at this time he still getting short of breath with minimal mobility. ?Continue Eliquis twice daily ? ? ? ? ?Pulmonary fibrosis ([place]) ?Followed outpatient by Dr. Vaughan Browner.  Pulmonary team following completed course of prednisone and Levaquin.  But due to concerns of ILD flare, started on Solu-Medrol- cont IV until 3/26, transition to PO Prednisone 60mg  po daily until seen by outptn Pulm ?Appreciate Pulm input.  ? ? ?Malignant neoplasm of upper lobe of right lung ([place]) ?RUL adenocarcinoma of the lung s/p bronchoscopy and robotic resection on 03/03/21 by Dr. Kipp Brood and Dr. Valeta Harms later complicated by large pneumothorax on 2/14 requiring chest tube and developed pneumonia.  Eventually discharged on Augmentin ? ? ? ?Essential hypertension, benign ?Resume Toprol and later Imdur if BP allows.  ? ? ?CAD (coronary artery disease), native coronary artery ?CAD s/p CABG; continue atorvastatin, Plavix ?Resume Toprol ? ?Anxiety ?Continue paxil and xanax prn  ? ?Low back pain ?Continue prn norco and gabapentin ? ?HLD (hyperlipidemia) ?Continue lipitor 80mg   ? ?BPH (benign prostatic hyperplasia) ?Continue Flomax daily ? ? ? ? ? ? ?  ? ? ? ?DVT prophylaxis: Eliquis ?Code Status: Full ?Family Communication:  ? ?Status is: Inpatient ?Still quite dyspneic with minimal movement.  Continue IV Solu-Medrol until 3/26.  For transition to p.o. on 3/27.  Maintain hospital stay for at least next 48 hours.  Appreciate input from ? ?Subjective: ?Seen and examined at bedside, tells me he feels slightly better compared to yesterday but still getting dyspneic with minimal exertion. ? ?Examination: ? ?Constitutional: Not  in acute distress, 3 L nasal cannula ?Respiratory: Mild bilateral rhonchi especially at bases ?Cardiovascular: Normal sinus rhythm, no rubs ?Abdomen: Nontender nondistended good bowel sounds ?Musculoskeletal: No edema noted ?Skin: No rashes seen ?Neurologic: CN 2-12  grossly intact.  And nonfocal ?Psychiatric: Normal judgment and insight. Alert and oriented x 3. Normal mood. ? ?Objective: ?Vitals:  ? 05/21/21 1938 05/21/21 2325 05/21/21 2353 05/22/21 9833  ?BP: (!) 144/84  (!) 158/89 (!) 154/90  ?Pulse: 80  71 74  ?Resp: 20 20 20 20   ?Temp: 98.4 ?F (36.9 ?C)  98 ?F (36.7 ?C) 97.8 ?F (36.6 ?C)  ?TempSrc: Oral  Oral Oral  ?SpO2: 100%  98% 94%  ?Weight:      ?Height:      ? ? ?Intake/Output Summary (Last 24 hours) at 05/22/2021 0800 ?Last data filed at 05/22/2021 0342 ?Gross per 24 hour  ?Intake 562.95 ml  ?Output 1450 ml  ?Net -887.05 ml  ? ?Filed Weights  ? 05/18/21 1700  ?Weight: 94.5 kg  ? ? ? ?Data Reviewed:  ? ?CBC: ?Recent Labs  ?Lab 05/18/21 ?1438 05/18/21 ?1456 05/19/21 ?0115 05/20/21 ?0143 05/21/21 ?0128 05/22/21 ?0101  ?WBC 15.3*  --  12.0* 12.4* 14.9* 15.4*  ?NEUTROABS 13.7*  --   --   --   --   --   ?HGB 14.5 15.0 13.7 13.1 13.8 12.7*  ?HCT 42.5 44.0 40.1 38.3* 40.1 37.8*  ?MCV 94.0  --  92.8 91.6 92.0 92.6  ?PLT 579*  --  413* 493* 557* 526*  ? ?Basic Metabolic Panel: ?Recent Labs  ?Lab 05/18/21 ?1439 05/18/21 ?1456 05/19/21 ?0115 05/20/21 ?0143 05/21/21 ?0128 05/22/21 ?0101  ?NA 136 137 141 137 137 137  ?K 3.9 4.0 3.6 3.7 4.0 4.4  ?CL 102 103 105 106 103 105  ?CO2 22  --  25 23 25 25   ?GLUCOSE 141* 139* 119* 121* 133* 136*  ?BUN 18 21 14 13 18 15   ?CREATININE 0.97 0.80 0.82 0.70 1.00 0.74  ?CALCIUM 9.2  --  9.1 8.9 9.2 8.8*  ?MG 2.0  --   --  2.2 2.0 2.0  ? ?GFR: ?Estimated Creatinine Clearance: 99.6 mL/min (by C-G formula based on SCr of 0.74 mg/dL). ?Liver Function Tests: ?Recent Labs  ?Lab 05/18/21 ?1439  ?AST 33  ?ALT 29  ?ALKPHOS 95  ?BILITOT 1.0  ?PROT 6.8  ?ALBUMIN 3.1*  ? ?No results for input(s): LIPASE, AMYLASE in the last 168 hours. ?No results for input(s): AMMONIA in the last 168 hours. ?Coagulation Profile: ?No results for input(s): INR, PROTIME in the last 168 hours. ?Cardiac Enzymes: ?No results for input(s): CKTOTAL, CKMB, CKMBINDEX, TROPONINI in the  last 168 hours. ?BNP (last 3 results) ?No results for input(s): PROBNP in the last 8760 hours. ?HbA1C: ?No results for input(s): HGBA1C in the last 72 hours. ?CBG: ?No results for input(s): GLUCAP in the last 168 hours. ?Lipid Profile: ?No results for input(s): CHOL, HDL, LDLCALC, TRIG, CHOLHDL, LDLDIRECT in the last 72 hours. ?Thyroid Function Tests: ?No results for input(s): TSH, T4TOTAL, FREET4, T3FREE, THYROIDAB in the last 72 hours. ?Anemia Panel: ?No results for input(s): VITAMINB12, FOLATE, FERRITIN, TIBC, IRON, RETICCTPCT in the last 72 hours. ?Sepsis Labs: ?Recent Labs  ?Lab 05/18/21 ?1649 05/18/21 ?2041 05/19/21 ?0115  ?PROCALCITON  --   --  <0.10  ?LATICACIDVEN 1.5 1.8  --   ? ? ?Recent Results (from the past 240 hour(s))  ?Blood culture (routine x 2)     Status: None (Preliminary result)  ?  Collection Time: 05/18/21  2:40 PM  ? Specimen: Right Antecubital; Blood  ?Result Value Ref Range Status  ? Specimen Description RIGHT ANTECUBITAL  Final  ? Special Requests   Final  ?  BOTTLES DRAWN AEROBIC AND ANAEROBIC Blood Culture adequate volume  ? Culture   Final  ?  NO GROWTH 3 DAYS ?Performed at [facility], [place] [address], [place], [place] [address] ?  ? Report Status PENDING  Incomplete  ?Resp Panel by RT-PCR (Flu A&B, Covid) Nasopharyngeal Swab     Status: None  ? Collection Time: 05/18/21  2:40 PM  ? Specimen: Nasopharyngeal Swab; Nasopharyngeal(NP) swabs in vial transport medium  ?Result Value Ref Range Status  ? SARS Coronavirus 2 by RT PCR NEGATIVE NEGATIVE Final  ?  Comment: (NOTE) ?SARS-CoV-2 target nucleic acids are NOT DETECTED. ? ?The SARS-CoV-2 RNA is generally detectable in upper respiratory ?specimens during the acute phase of infection. The lowest ?concentration of SARS-CoV-2 viral copies this assay can detect is ?138 copies/mL. A negative result does not preclude SARS-Cov-2 ?infection and should not be used as the sole basis for treatment or ?other patient management decisions. A  negative result may occur with  ?improper specimen collection/handling, submission of specimen other ?than nasopharyngeal swab, presence of viral mutation(s) within the ?areas targeted by this assay, and in

## 2021-05-22 NOTE — Progress Notes (Signed)
Occupational Therapy Treatment ?Patient Details ?Name: Richard Parrish ?MRN: [id number] ?DOB: 1951-02-01 ?Today's Date: 05/22/2021 ? ? ?History of present illness Richard Parrish is a 71 year old male admitted 05/18/21 admitted to the hospital for worsening SOB, weakness over the past week. Dx with submassive PE with right heart strain; left peroneal DVT (on heparin switched to Eliquis) Of note: About a month ago patient was diagnosed with pneumothorax requiring chest tube complicated by pneumonia. 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 ?  ?OT comments ? Pt progressing reports feeling a little better in standing and transferring to recliner. Pt supervisionA for bed mobility to EOB; minA for sit to stand and transfer with RW. Orthostatics limiting standing ADL tasks. Dizziness finally subsided after 3 mins sitting upright in recliner.  ?BP sitting: 142/91, 76 BPM sitting 114/64, 88 BPM; standing 118/111, 98 BPM. Unable to obtain 3 min vital signs standing due to discomfort in standing. ?Pt would benefit from continued OT skilled services. OT following acutely.  ? ?Recommendations for follow up therapy are one component of a multi-disciplinary discharge planning process, led by the attending physician.  Recommendations may be updated based on patient status, additional functional criteria and insurance authorization. ?   ?Follow Up Recommendations ? Home health OT  ?  ?Assistance Recommended at Discharge Frequent or constant Supervision/Assistance  ?Patient can return home with the following ? A little help with walking and/or transfers;A little help with bathing/dressing/bathroom;Assistance with cooking/housework;Assist for transportation;Help with stairs or ramp for entrance ?  ?Equipment Recommendations ? BSC/3in1  ?  ?Recommendations for Other Services   ? ?  ?Precautions / Restrictions  Precautions ?Precautions: Fall;Other (comment) ?Precaution Comments: watch 02 and BP (orthostatic on eval) ?Restrictions ?Weight Bearing Restrictions: No  ? ? ?  ? ?Mobility Bed Mobility ?Overal bed mobility: Needs Assistance ?Bed Mobility: Sidelying to Sit, Rolling ?Rolling: Supervision ?Sidelying to sit: Min guard, HOB elevated ?  ?  ?  ?General bed mobility comments: instructions to roll to side and push up with R elbow as BLEs come off of bed ?  ? ?Transfers ?Overall transfer level: Needs assistance ?Equipment used: Rolling walker (2 wheels) ?Transfers: Sit to/from Stand, Bed to chair/wheelchair/BSC ?Sit to Stand: Min assist ?Stand pivot transfers: Min assist ?  ?Step pivot transfers: Min assist ?  ?  ?General transfer comment: Pt cues for hand placement; stepping from bed to recliner and symptomatic from standing with + orthostatics. ?  ?  ?Balance Overall balance assessment: Needs assistance ?Sitting-balance support: Feet supported, No upper extremity supported ?Sitting balance-Leahy Scale: Good ?  ?  ?Standing balance support: During functional activity, Reliant on assistive device for balance, Bilateral upper extremity supported ?Standing balance-Leahy Scale: Poor ?Standing balance comment: Min guard A due to lightheadedness ?  ?  ?  ?  ?  ?  ?  ?  ?  ?  ?  ?   ? ?ADL either performed or assessed with clinical judgement  ? ?ADL Overall ADL's : Needs assistance/impaired ?  ?  ?Grooming: Set up;Sitting;Oral care;Wash/dry face;Applying deodorant ?Grooming Details (indicate cue type and reason): can briefly stand, but pt fighting hypotension in standing ?  ?  ?  ?  ?  ?  ?  ?  ?Toilet Transfer: Min guard;BSC/3in1;Stand-pivot ?Toilet Transfer Details (indicate cue type and reason): pt orthostatic and symptomatic ?  ?  ?  ?  ?Functional mobility during ADLs: Min guard;Rolling  walker (2 wheels) ?General ADL Comments: orthostatics limiting standing ADL tasks; transfer to recliner and dizziness finally subsided after  3 mins sitting upright in recliner ?  ? ?Extremity/Trunk Assessment Upper Extremity Assessment ?Upper Extremity Assessment: Overall WFL for tasks assessed ?  ?Lower Extremity Assessment ?Lower Extremity Assessment: Generalized weakness ?  ?  ?  ? ?Vision   ?Vision Assessment?: No apparent visual deficits ?Additional Comments: wears contact usually; glasses here ?  ?Perception   ?  ?Praxis   ?  ? ?Cognition Arousal/Alertness: Awake/alert ?Behavior During Therapy: [facility] for tasks assessed/performed ?Overall Cognitive Status: Within Functional Limits for tasks assessed ?  ?  ?  ?  ?  ?  ?  ?  ?  ?  ?  ?  ?  ?  ?  ?  ?  ?  ?  ?   ?Exercises   ? ?  ?Shoulder Instructions   ? ? ?  ?General Comments BP sitting: 142/91, 76 BPM sitting 114/64, 88 BPM; standing 118/111, 98 BPM. Unable to obtain 3 min standding due to discomfort in standing.  ? ? ?Pertinent Vitals/ Pain       Pain Assessment ?Pain Assessment: 0-10 ?Pain Score: 6  ?Pain Location: "all over" ?Pain Descriptors / Indicators: Discomfort, Tender ?Pain Intervention(s): Monitored during session, Repositioned ? ?Home Living   ?  ?  ?  ?  ?  ?  ?  ?  ?  ?  ?  ?  ?  ?  ?  ?  ?  ?  ? ?  ?Prior Functioning/Environment    ?  ?  ?  ?   ? ?Frequency ? Min 2X/week  ? ? ? ? ?  ?Progress Toward Goals ? ?OT Goals(current goals can now be found in the care plan section) ? Progress towards OT goals: Progressing toward goals ? ?Acute Rehab OT Goals ?Patient Stated Goal: to get rid of dizziness ?OT Goal Formulation: With patient/family ?Time For Goal Achievement: 06/04/21 ?Potential to Achieve Goals: Good  ?Plan Discharge plan remains appropriate   ? ?Co-evaluation ? ? ?   ?  ?  ?  ?  ? ?  ?AM-PAC OT "6 Clicks" Daily Activity     ?Outcome Measure ? ? Help from another person eating meals?: None ?Help from another person taking care of personal grooming?: A Little ?Help from another person toileting, which includes using toliet, bedpan, or urinal?: A Little ?Help from another person  bathing (including washing, rinsing, drying)?: A Little ?Help from another person to put on and taking off regular upper body clothing?: A Little ?Help from another person to put on and taking off regular lower body clothing?: A Little ?6 Click Score: 19 ? ?  ?End of Session Equipment Utilized During Treatment: Gait belt;Rolling walker (2 wheels);Oxygen ? ?OT Visit Diagnosis: Unsteadiness on feet (R26.81);Pain ?Pain - part of body:  ("all over") ?  ?Activity Tolerance Patient tolerated treatment well;Other (comment) (limited by +orthostatics) ?  ?Patient Left in chair;with call bell/phone within reach;with chair alarm set;with family/visitor present ?  ?Nurse Communication Mobility status ?  ? ?   ? ?Time: 0254-2706 ?OT Time Calculation (min): 38 min ? ?Charges: OT General Charges ?$OT Visit: 1 Visit ?OT Treatments ?$Self Care/Home Management : 23-37 mins ?$Therapeutic Activity: 8-22 mins ? ?Jefferey Pica, OTR/L ?Acute Rehabilitation Services ?Office: [phone] ? ? ?Jenene Slicker Bernyce Brimley ?05/22/2021, 1:42 PM ?

## 2021-05-22 NOTE — TOC Initial Note (Signed)
Transition of Care [facility]) - Initial/Assessment Note    Patient Details  Name: Richard Parrish MRN: [id number] Date of Birth: 12-05-1950  Transition of Care [facility]) CM/SW Contact:    Mearl Latin, LCSW Phone Number: 05/22/2021, 9:40 AM  Clinical Narrative:                 CSW received consult for possible SNF placement at time of discharge. CSW spoke with patient. Patient reported that patient's spouse is currently unable to care for patient at their home given patient's current physical needs and fall risk. Patient expressed understanding of PT recommendation and is agreeable to SNF placement at time of discharge. Patient reports preference for [facility] [place]. CSW discussed insurance authorization process and will provide Medicare SNF ratings list. Patient has received  COVID vaccines. CSW will send out referrals for review. Patient expressed being hopeful for rehab and to feel better soon. No further questions reported at this time.   Skilled Nursing Rehab Facilities-   [email or web address]   Ratings out of 5 possible   Name Address  Phone # Quality Care Staffing Health Inspection Overall  [facility] [address] [place] [phone] 5 5 2 4   [facility]  [address] [place], [place] [phone] 4 2 5 5   [facility] [address] [place], [place] [phone] 4 1 1 1   [facility] [address] [phone] 2 2 4 4   [facility] [address], [place] [phone] 1 1 2 1   [facility] [phone] [address], [place] [phone] 2 1 4 3   [facility] [address], [place] [phone] 5 2 2 3   [facility] [address] [place] [phone] 5 2 2 3   [address] ([place]) [address], [place] [phone] 5 1 2 2   [facility] [phone] [address], [place] [phone] 4 1 1 1   [facility] [address], [facility] [phone] 1  2 1   [facility] ([place]) [address], [place] [phone] 4 1 1 1           [facility] [address] [place] [phone]      [facility] [address], [place] [phone] 4 2 3 3   [facility] [address], [place] [phone] [address], [place] [address] [place] [address], [place] [phone] 2 1 1 1   [facility] [address], [employer] [phone] 2 2 3 3           [address] (no [facility]) [address], [place] [phone] 4 5 5 5   [facility] (No Humana) [address] [place], [place] [phone] 4 1 4 3   [facility] (No UHC) [address], [place] [phone] 5 5 5 5   [facility] [address] [place] [phone] 3 2 4 4   [facility] [address] [place] [phone] 3 3 4 4   [facility] [address], [place] [phone] 1 1 2 1   [facility] [address] [place] [phone] 2 1 1 1   [facility] [address] [place] [phone] 5 2 4 5   [facility] [address], [place] [phone] 3 1 1 1   [facility] [address] [place], [place] [phone] 2 1 2 1           [facility] [address], [place] [phone] 1 1 1 1   [facility] [address] [place] Clines  [phone] 2  4 2 2   [facility] [address], [place] [phone] 5 2 3 4   [facility] [place] [address], [place] [phone] 2 1 1 1   [facility] (No Humana) [address], [place] [phone] 2 1 3 2   [facility] [address], [place] [phone] 3 1 1 1           [facility] [address] [place], [place] [phone] 5 4 5 5   [facility])  [address] [place] [phone] 2 2 3 3   [facility]) [address] [place] [phone] 3 2 4 4   [facility] [place] [address], [place] [phone] 4 3 4 4   [address]  [place], [place] [phone] 3 3 1 1   [facility]) [address] [place] [phone] 2 2 4 4      Expected Discharge Plan: Skilled Nursing Facility Barriers to Discharge: Continued Medical Work up, English as a second language teacher, SNF Pending bed offer   Patient Goals and CMS Choice Patient states their goals for this hospitalization and ongoing recovery are:: Rehab CMS Medicare.gov Compare Post Acute Care list provided to:: Patient Choice offered to / list presented to : Patient  Expected Discharge Plan and Services Expected Discharge Plan: Skilled Nursing Facility In-house Referral: Clinical Social Work   Post Acute Care Choice: Skilled Nursing Facility Living arrangements for the past 2 months: Single Family Home                                      Prior Living Arrangements/Services Living arrangements for the past 2 months: Single Family Home Lives with:: Spouse Patient language and need for interpreter reviewed:: Yes Do you feel safe going back to the place where you live?: Yes      Need for Family Participation in Patient Care: Yes (Comment) Care giver support system in place?: Yes (comment)   Criminal Activity/Legal Involvement Pertinent to Current Situation/Hospitalization: No - Comment as needed  Activities of Daily Living Home Assistive Devices/Equipment: Contact lenses, Eyeglasses, Bedside commode/3-in-1 ADL Screening (condition at time of admission) Patient's cognitive ability adequate to safely complete daily activities?: Yes Is the patient deaf or have difficulty hearing?: Yes Does the patient have difficulty seeing, even when wearing glasses/contacts?: No Does the patient have difficulty concentrating, remembering, or making decisions?: No Patient able to express need for assistance with ADLs?: Yes Does the patient have difficulty dressing or bathing?: No Independently performs ADLs?: Yes (appropriate for developmental  age) Does the patient have difficulty walking or climbing stairs?: No Weakness of Legs: Both Weakness of Arms/Hands: Both  Permission Sought/Granted Permission sought to share information with : Facility Medical sales representative, Family Supports Permission granted to share information with : Yes, Verbal Permission Granted  Share Information with NAME: Milon Dikes  Permission granted to share info w AGENCY: SNFs  Permission granted to share info w Relationship: Spouse  Permission granted to share info w Contact Information: [phone]  Emotional Assessment Appearance:: Appears stated age Attitude/Demeanor/Rapport: Engaged, Gracious Affect (typically observed): Accepting, Appropriate, Pleasant Orientation: : Oriented to Self, Oriented to Place, Oriented to  Time, Oriented to Situation Alcohol / Substance Use: Not Applicable Psych Involvement: No (comment)  Admission diagnosis:  Pulmonary embolism (HCC) [I26.99] Acute respiratory failure with hypoxia (HCC) [J96.01] Multiple subsegmental pulmonary emboli without acute cor pulmonale (HCC) [I26.94] Patient Active Problem List   Diagnosis  Date Noted   Pulmonary embolism (HCC) 05/18/2021   Acute respiratory failure with hypoxia (HCC) 05/18/2021   Pulmonary fibrosis (HCC) 05/18/2021   Recurrent right pleural effusion 04/25/2021   Pleural effusion on right 04/25/2021   Hypotension 04/20/2021   BPH (benign prostatic hyperplasia) 04/19/2021   Pneumothorax 04/13/2021   Tobacco user 03/19/2021   Severe major depression, single episode, without psychotic features (HCC) 03/19/2021   Sciatica 03/19/2021   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   Obstructive sleep apnea syndrome 03/19/2021   Malignant neoplasm of upper lobe, right bronchus or lung (HCC) 03/19/2021   Lymphocytic colitis 03/19/2021   Low back pain 03/19/2021   Irritable bowel syndrome 03/19/2021   Insomnia 03/19/2021   Frequency of micturition 03/19/2021   Confusional state  03/19/2021   Chronic pain syndrome 03/19/2021   Gastroesophageal reflux disease 03/19/2021   Anxiety 03/19/2021   Amnesia 03/19/2021   Acquired absence of lung (part of) 03/19/2021   Malignant neoplasm of upper lobe of right lung (HCC) 03/08/2021   Lung nodule 03/03/2021   S/P Robotic Assisted Right Video Assisted Thoracoscopy with Right Upper Lobectomy, Right Lower Lobe wedge resection 03/03/2021   Malnutrition of moderate degree 10/05/2020   Shortness of breath    Unstable angina (HCC) 10/03/2020   Chest pain    Hx of CABG    Disc degeneration, lumbar 02/10/2018   Aortic atherosclerosis (HCC) 11/13/2017   Pure hypercholesterolemia 11/13/2017   Degenerative joint disease of left hip 04/07/2017   Right hip pain 06/01/2016   Achilles tendon pain 12/31/2015   Postinflammatory pulmonary fibrosis (HCC) 07/24/2013   Cough 07/24/2013   CAD (coronary artery disease), native coronary artery 04/01/2013   Essential hypertension, benign 12/12/2012    Class: Chronic   HLD (hyperlipidemia) 12/12/2012   Atherosclerosis 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 AT [facility] [address] [place] [address] Phone: [phone] Fax: [phone]     Social Determinants of Health (SDOH) Interventions    Readmission Risk Interventions    05/22/2021    9:37 AM 04/20/2021    8:25 AM  Readmission Risk Prevention Plan  Transportation Screening Complete Complete  PCP or Specialist Appt within 5-7 Days  Complete  Home Care Screening  Complete  Medication Review (RN CM)  Complete  Medication Review (RN Care Manager) Complete   PCP or Specialist appointment within 3-5 days of discharge Complete   HRI or Home Care Consult Complete   SW Recovery Care/Counseling Consult Complete    Palliative Care Screening Not Applicable   Skilled Nursing Facility Complete

## 2021-05-22 NOTE — Progress Notes (Addendum)
Physical Therapy Treatment ?Patient Details ?Name: Richard Parrish ?MRN: [id number] ?DOB: [birth year] ?Today's Date: 05/22/2021 ? ? ?History of Present Illness Richard Parrish is a 71 year old male admitted 05/18/21 admitted to the hospital for worsening SOB, weakness over the past week. Dx with submassive PE with right heart strain; left peroneal DVT (on heparin switched to Eliquis) Of note: About a month ago patient was diagnosed with pneumothorax requiring chest tube complicated by pneumonia. 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 ? ?  ?PT Comments  ? ? Pt received sitting up in recliner. Pt is progressing slowly towards his physical therapy goals; remains limited by decreased activity tolerance and orthostatic hypotension. HR 77-95, SpO2 80-90% on 3L O2 (poor waveform). Pt able to participate in seated exercises and ambulating 10 feet with a walker at a min guard assist level.  ? ?BP: ?Sitting: 100/66 (77) ?Standing: 85/45 (57) ?Return to sitting: 107/73 (84) ?   ?Recommendations for follow up therapy are one component of a multi-disciplinary discharge planning process, led by the attending physician.  Recommendations may be updated based on patient status, additional functional criteria and insurance authorization. ? ?Follow Up Recommendations ? Skilled nursing-short term rehab (<3 hours/day) (preference for [facility] [place]) ?  ?  ?Assistance Recommended at Discharge Frequent or constant Supervision/Assistance  ?Patient can return home with the following Help with stairs or ramp for entrance;Assistance with cooking/housework;A little help with bathing/dressing/bathroom;A little help with walking and/or transfers ?  ?Equipment Recommendations ? None recommended by PT  ?  ?Recommendations for Other Services   ? ? ?  ?Precautions / Restrictions Precautions ?Precautions: Fall;Other  (comment) ?Precaution Comments: watch 02 and BP (orthostatic on eval) ?Restrictions ?Weight Bearing Restrictions: No  ?  ? ?Mobility ? Bed Mobility ?Overal bed mobility: Needs Assistance ?Bed Mobility: Sit to Supine ?  ?  ?  ?Sit to supine: Supervision ?  ?General bed mobility comments: no physical assist for return to bed ?  ? ?Transfers ?Overall transfer level: Needs assistance ?Equipment used: Rolling walker (2 wheels) ?Transfers: Sit to/from Stand ?Sit to Stand: Min guard ?  ?  ?  ?  ?  ?General transfer comment: reminders for hand placement ?  ? ?Ambulation/Gait ?Ambulation/Gait assistance: Min guard ?Gait Distance (Feet): 10 Feet ?Assistive device: Rolling walker (2 wheels) ?Gait Pattern/deviations: Step-through pattern, Decreased stride length ?Gait velocity: decreased ?Gait velocity interpretation: <1.8 ft/sec, indicate of risk for recurrent falls ?  ?General Gait Details: Bilateral knee instability, min guard for safety ? ? ?Stairs ?  ?  ?  ?  ?  ? ? ?Wheelchair Mobility ?  ? ?Modified Rankin (Stroke Patients Only) ?  ? ? ?  ?Balance Overall balance assessment: Needs assistance ?Sitting-balance support: Feet supported, No upper extremity supported ?Sitting balance-Leahy Scale: Good ?  ?  ?Standing balance support: During functional activity, Reliant on assistive device for balance, Bilateral upper extremity supported ?Standing balance-Leahy Scale: Poor ?Standing balance comment: Min guard A due to lightheadedness ?  ?  ?  ?  ?  ?  ?  ?  ?  ?  ?  ?  ? ?  ?Cognition Arousal/Alertness: Awake/alert ?Behavior During Therapy: [facility] for tasks assessed/performed ?Overall Cognitive Status: Within Functional Limits for tasks assessed ?  ?  ?  ?  ?  ?  ?  ?  ?  ?  ?  ?  ?  ?  ?  ?  ?  General Comments: Needs reminders to conserve energy ?  ?  ? ?  ?Exercises General Exercises - Lower Extremity ?Ankle Circles/Pumps: Both, 10 reps, Seated ?Long Arc Quad: Both, 10 reps, Seated ?Hip Flexion/Marching: Both, 10 reps,  Standing ?Heel Raises: Both, 10 reps, Standing ? ?  ?General Comments General comments (skin integrity, edema, etc.): BP sitting: 142/91, 76 BPM sitting 114/64, 88 BPM; standing 118/111, 98 BPM. Unable to obtain 3 min standding due to discomfort in standing. ?  ?  ? ?Pertinent Vitals/Pain Pain Assessment ?Pain Assessment: Faces ?Faces Pain Scale: Hurts little more ?Pain Location: "all over" ?Pain Descriptors / Indicators: Discomfort, Tender ?Pain Intervention(s): Monitored during session  ? ? ?Home Living   ?  ?  ?  ?  ?  ?  ?  ?  ?  ?   ?  ?Prior Function    ?  ?  ?   ? ?PT Goals (current goals can now be found in the care plan section) Acute Rehab PT Goals ?Potential to Achieve Goals: Good ?Progress towards PT goals: Progressing toward goals ? ?  ?Frequency ? ? ? Min 3X/week ? ? ? ?  ?PT Plan Current plan remains appropriate  ? ? ?Co-evaluation   ?  ?  ?  ?  ? ?  ?AM-PAC PT "6 Clicks" Mobility   ?Outcome Measure ? Help needed turning from your back to your side while in a flat bed without using bedrails?: None ?Help needed moving from lying on your back to sitting on the side of a flat bed without using bedrails?: A Little ?Help needed moving to and from a bed to a chair (including a wheelchair)?: A Little ?Help needed standing up from a chair using your arms (e.g., wheelchair or bedside chair)?: A Little ?Help needed to walk in hospital room?: A Little ?Help needed climbing 3-5 steps with a railing? : A Lot ?6 Click Score: 18 ? ?  ?End of Session Equipment Utilized During Treatment: Gait belt;Oxygen ?Activity Tolerance: Patient tolerated treatment well ?Patient left: in bed;with call bell/phone within reach;with family/visitor present ?Nurse Communication: Mobility status ?PT Visit Diagnosis: Muscle weakness (generalized) (M62.81);Difficulty in walking, not elsewhere classified (R26.2) ?  ? ? ?Time: 9629-5284 ?PT Time Calculation (min) (ACUTE ONLY): 27 min ? ?Charges:  $Therapeutic Activity: 23-37 mins           ?          ? ?Wyona Almas, PT, DPT ?Acute Rehabilitation Services ?Pager [phone] ?Office [phone] ? ? ? ?Deno Etienne ?05/22/2021, 3:26 PM ? ?

## 2021-05-22 NOTE — [place] FL2 (Signed)
?[place] MEDICAID FL2 LEVEL OF CARE SCREENING TOOL  ?  ? ?IDENTIFICATION  ?Patient Name: ?Richard Parrish Birthdate: 06-24-1950 Sex: male Admission Date (Current Location): ?05/18/2021  ?[place] and [place] Number: ? [place] ?  Facility and Address:  ?[facility], [place] [address] [place], [place] [address] ?     Provider Number: ?[id number]  ?Attending Physician Name and Address:  ?Damita Lack, MD ? Relative Name and Phone Number:  ?  ?   ?Current Level of Care: ?Hospital Recommended Level of Care: ?[place] Prior Approval Number: ?  ? ?Date Approved/Denied: ?  PASRR Number: ?[id number] A ? ?Discharge Plan: ?SNF ?  ? ?Current Diagnoses: ?Patient Active Problem List  ? Diagnosis Date Noted  ? Pulmonary embolism ([place]) 05/18/2021  ? Acute respiratory failure with hypoxia ([place]) 05/18/2021  ? Pulmonary fibrosis ([place]) 05/18/2021  ? Recurrent right pleural effusion 04/25/2021  ? Pleural effusion on right 04/25/2021  ? Hypotension 04/20/2021  ? BPH (benign prostatic hyperplasia) 04/19/2021  ? Pneumothorax 04/13/2021  ? Tobacco user 03/19/2021  ? Severe major depression, single episode, without psychotic features ([place]) 03/19/2021  ? Sciatica 03/19/2021  ? 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[place]) 03/19/2021  ? Obstructive sleep apnea syndrome 03/19/2021  ? Malignant neoplasm of upper lobe, right bronchus or lung ([place]) 03/19/2021  ? Lymphocytic colitis 03/19/2021  ? Low back pain 03/19/2021  ? Irritable bowel syndrome 03/19/2021  ? Insomnia 03/19/2021  ? Frequency of micturition 03/19/2021  ? Confusional state 03/19/2021  ? Chronic pain syndrome 03/19/2021  ? Gastroesophageal reflux disease 03/19/2021  ? Anxiety 03/19/2021  ? Amnesia 03/19/2021  ? Acquired absence of lung (part of) 03/19/2021  ? Malignant neoplasm of upper lobe of right lung ([place]) 03/08/2021  ? Lung nodule 03/03/2021  ? S/P Robotic Assisted Right Video Assisted Thoracoscopy with Right Upper Lobectomy, Right Lower Lobe  wedge resection 03/03/2021  ? Malnutrition of moderate degree 10/05/2020  ? Shortness of breath   ? Unstable angina ([place]) 10/03/2020  ? Chest pain   ? Hx of CABG   ? Disc degeneration, lumbar 02/10/2018  ? Aortic atherosclerosis ([place]) 11/13/2017  ? Pure hypercholesterolemia 11/13/2017  ? Degenerative joint disease of left hip 04/07/2017  ? Right hip pain 06/01/2016  ? Achilles tendon pain 12/31/2015  ? Postinflammatory pulmonary fibrosis ([place]) 07/24/2013  ? Cough 07/24/2013  ? CAD (coronary artery disease), native coronary artery 04/01/2013  ? Essential hypertension, benign 12/12/2012  ? HLD (hyperlipidemia) 12/12/2012  ? Atherosclerosis of native arteries of the extremities with intermittent claudication 11/25/2011  ? Peripheral vascular disease, unspecified ([place]) 02/28/2011  ? ? ?Orientation RESPIRATION BLADDER Height & Weight   ?  ?Self, Time, Situation, Place ? O2 (3L Nasal cannula) Continent, External catheter Weight: 208 lb 5.4 oz (94.5 kg) ?Height:  6' (182.9 cm)  ?BEHAVIORAL SYMPTOMS/MOOD NEUROLOGICAL BOWEL NUTRITION STATUS  ?    Continent Diet (See DC Summary)  ?AMBULATORY STATUS COMMUNICATION OF NEEDS Skin   ?Limited Assist Verbally Normal ?  ?  ?  ?    ?     ?     ? ? ?Personal Care Assistance Level of Assistance  ?Bathing, Feeding, Dressing Bathing Assistance: Limited assistance ?Feeding assistance: Independent ?Dressing Assistance: Limited assistance ?   ? ?Functional Limitations Info  ?Sight, Hearing Sight Info: Impaired ?Hearing Info: Impaired ?   ? ? ?SPECIAL CARE FACTORS FREQUENCY  ?PT (By licensed PT), OT (By licensed OT)   ?  ?PT Frequency: 5x/week ?OT Frequency: 5x/week ?  ?  ?  ?   ? ? ?  Contractures Contractures Info: Not present  ? ? ?Additional Factors Info  ?Code Status, Allergies Code Status Info: Full ?Allergies Info: Ambien ?  ?  ?  ?   ? ?Current Medications (05/22/2021):  This is the current hospital active medication list ?Current Facility-Administered Medications  ?Medication Dose Route  Frequency Provider Last Rate Last Admin  ? 0.9 %  sodium chloride infusion  250 mL Intravenous PRN Orma Flaming, MD      ? acetaminophen (TYLENOL) tablet 650 mg  650 mg Oral Q6H PRN Orma Flaming, MD      ? Or  ? acetaminophen (TYLENOL) suppository 650 mg  650 mg Rectal Q6H PRN Orma Flaming, MD      ? ALPRAZolam Duanne Moron) tablet 0.25-0.5 mg  0.25-0.5 mg Oral TID PRN Orma Flaming, MD   0.5 mg at 05/22/21 0423  ? apixaban (ELIQUIS) tablet 10 mg  10 mg Oral BID Candee Furbish, MD   10 mg at 05/21/21 2128  ? Followed by  ? Derrill Memo ON 05/26/2021] apixaban (ELIQUIS) tablet 5 mg  5 mg Oral BID Candee Furbish, MD      ? atorvastatin (LIPITOR) tablet 80 mg  80 mg Oral Daily Orma Flaming, MD   80 mg at 05/21/21 0850  ? cholecalciferol (VITAMIN D3) tablet 1,000 Units  1,000 Units Oral Daily Orma Flaming, MD   1,000 Units at 05/21/21 [phone]  ? clopidogrel (PLAVIX) tablet 75 mg  75 mg Oral Daily Orma Flaming, MD   75 mg at 05/21/21 0850  ? gabapentin (NEURONTIN) capsule 200 mg  200 mg Oral TID Orma Flaming, MD   200 mg at 05/21/21 2128  ? guaiFENesin (MUCINEX) 12 hr tablet 600 mg  600 mg Oral BID PRN Orma Flaming, MD   600 mg at 05/21/21 1854  ? hydrALAZINE (APRESOLINE) injection 10 mg  10 mg Intravenous Q4H PRN Amin, Jeanella Flattery, MD      ? HYDROcodone-acetaminophen (NORCO) 10-325 MG per tablet 1 tablet  1 tablet Oral Q4H PRN Orma Flaming, MD   1 tablet at 05/22/21 0423  ? hyoscyamine (LEVSIN SL) SL tablet 0.125 mg  0.125 mg Sublingual Q4H PRN Orma Flaming, MD      ? ipratropium-albuterol (DUONEB) 0.5-2.5 (3) MG/3ML nebulizer solution 3 mL  3 mL Nebulization Q4H PRN Amin, Ankit Chirag, MD      ? methylPREDNISolone sodium succinate (SOLU-MEDROL) 125 mg/2 mL injection 125 mg  125 mg Intravenous Daily Damita Lack, MD   125 mg at 05/21/21 6010  ? metoprolol succinate (TOPROL-XL) 24 hr tablet 25 mg  25 mg Oral Daily Amin, Ankit Chirag, MD   25 mg at 05/21/21 0850  ? metoprolol tartrate (LOPRESSOR) injection 5  mg  5 mg Intravenous Q4H PRN Amin, Ankit Chirag, MD      ? pantoprazole (PROTONIX) EC tablet 40 mg  40 mg Oral QAC breakfast Orma Flaming, MD   40 mg at 05/21/21 0849  ? PARoxetine (PAXIL) tablet 40 mg  40 mg Oral Daily Orma Flaming, MD   40 mg at 05/21/21 9323  ? [START ON 05/24/2021] predniSONE (DELTASONE) tablet 60 mg  60 mg Oral Q breakfast Amin, Ankit Chirag, MD      ? senna-docusate (Senokot-S) tablet 1 tablet  1 tablet Oral QHS PRN Amin, Ankit Chirag, MD      ? sodium chloride flush (NS) 0.9 % injection 3 mL  3 mL Intravenous Q12H Orma Flaming, MD   3 mL at 05/21/21 2135  ? sodium chloride  flush (NS) 0.9 % injection 3 mL  3 mL Intravenous PRN Orma Flaming, MD      ? sulfamethoxazole-trimethoprim (BACTRIM) 400-80 MG per tablet 1 tablet  1 tablet Oral Daily Freddi Starr, MD      ? tamsulosin (FLOMAX) capsule 0.4 mg  0.4 mg Oral Daily Orma Flaming, MD   0.4 mg at 05/21/21 0850  ? traZODone (DESYREL) tablet 50 mg  50 mg Oral QHS PRN Amin, Jeanella Flattery, MD      ? ? ? ?Discharge Medications: ?Please see discharge summary for a list of discharge medications. ? ?Relevant Imaging Results: ? ?Relevant Lab Results: ? ? ?Additional Information ?SSN: [id number]. Pfizer COVID-19 Vaccine 04/10/2019 , 03/21/2019 ? ?Lissa Morales Jennier Schissler, LCSW ? ? ? ? ?

## 2021-05-23 DIAGNOSIS — N400 Enlarged prostate without lower urinary tract symptoms: Secondary | ICD-10-CM | POA: Diagnosis not present

## 2021-05-23 DIAGNOSIS — J9601 Acute respiratory failure with hypoxia: Secondary | ICD-10-CM | POA: Diagnosis not present

## 2021-05-23 DIAGNOSIS — I2693 Single subsegmental pulmonary embolism without acute cor pulmonale: Secondary | ICD-10-CM | POA: Diagnosis not present

## 2021-05-23 DIAGNOSIS — I951 Orthostatic hypotension: Secondary | ICD-10-CM

## 2021-05-23 DIAGNOSIS — I10 Essential (primary) hypertension: Secondary | ICD-10-CM | POA: Diagnosis not present

## 2021-05-23 LAB — BASIC METABOLIC PANEL
Anion gap: 10 (ref 5–15)
BUN: 15 mg/dL (ref 8–23)
CO2: 24 mmol/L (ref 22–32)
Calcium: 9 mg/dL (ref 8.9–10.3)
Chloride: 102 mmol/L (ref 98–111)
Creatinine, Ser: 0.76 mg/dL (ref 0.61–1.24)
GFR, Estimated: >60 mL/min (ref >60)
Glucose, Bld: 117 mg/dL — ABNORMAL HIGH (ref 70–99)
Potassium: 4.3 mmol/L (ref 3.5–5.1)
Sodium: 136 mmol/L (ref 135–145)

## 2021-05-23 LAB — CBC
HCT: 39.7 % (ref 39.0–52.0)
Hemoglobin: 13.5 g/dL (ref 13.0–17.0)
MCH: 31.2 pg (ref 26.0–34.0)
MCHC: 34 g/dL (ref 30.0–36.0)
MCV: 91.7 fL (ref 80.0–100.0)
Platelets: 569 10*3/uL — ABNORMAL HIGH (ref 150–400)
RBC: 4.33 MIL/uL (ref 4.22–5.81)
RDW: 14 % (ref 11.5–15.5)
WBC: 17.9 10*3/uL — ABNORMAL HIGH (ref 4.0–10.5)
nRBC: 0 % (ref 0.0–0.2)

## 2021-05-23 LAB — CULTURE, BLOOD (ROUTINE X 2)
Culture: NO GROWTH
Culture: NO GROWTH
Special Requests: ADEQUATE
Special Requests: ADEQUATE

## 2021-05-23 LAB — MAGNESIUM: Magnesium: 2 mg/dL (ref 1.7–2.4)

## 2021-05-23 MED ORDER — SODIUM CHLORIDE 0.9 % IV SOLN
INTRAVENOUS | Status: AC
Start: 2021-05-23 — End: 2021-05-24

## 2021-05-23 MED ORDER — ENSURE ENLIVE PO LIQD
237.0000 mL | Freq: Two times a day (BID) | ORAL | Status: DC
  Administered 2021-05-23 – 2021-05-26 (×7): 237 mL via ORAL

## 2021-05-23 NOTE — Progress Notes (Signed)
?PROGRESS NOTE ? ? ? ?Richard Parrish  HQI:[id number] DOB: 1951-02-26 DOA: 05/18/2021 ?PCP: Seward Carol, MD  ? 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  About a month ago patient was diagnosed with pneumothorax requiring chest tube complicated by pneumonia and eventually discharged on 7 days of Augmentin.  When he saw pulmonary about a week ago he was given a course of prednisone, Z-Pak and later switched to Levaquin.  Last day of Levaquin 05/20/2021.   ? ?Upon this admission he was diagnosed with submassive PE with right heart strain, seen by pulmonary team.  Currently on heparin drip not a candidate for thrombolytics.  Lower extremity Dopplers is positive for left peroneal DVT.  Heparin drip transition to Eliquis.  Echocardiogram overall unremarkable with EF of 65% and normal RV.  Patient was also started on IV Solu-Medrol for ILD flare. ? ? ?Assessment & Plan: ? Principal Problem: ?  Pulmonary embolism ([place]) ?Active Problems: ?  Acute respiratory failure with hypoxia ([place]) ?  Pulmonary fibrosis ([place]) ?  Malignant neoplasm of upper lobe of right lung ([place]) ?  Essential hypertension, benign ?  CAD (coronary artery disease), native coronary artery ?  Anxiety ?  Low back pain ?  HLD (hyperlipidemia) ?  BPH (benign prostatic hyperplasia) ?  Orthostasis ?  ? ? ?Assessment and Plan: ?* Pulmonary embolism ([place]) ?Secondary to submassive PE, continue supplemental oxygen.  CT showing right heart strain, echocardiogram shows normal EF and RV. ?Lower extremity Dopplers-positive for left peroneal vein DVT. ?Pain medication ?Continue Eliquis ? ? ?Acute respiratory failure with hypoxia ([place]) ?Secondary to submassive PE and pulmonary fibrosis.  Along with complicated pulmonary history still  requiring 3 L nasal cannula.  Anticipate will require some form of oxygen at home.  But at this time he still getting short of breath with minimal mobility. ?Continue Eliquis twice daily ? ? ? ? ?Pulmonary fibrosis ([place]) ?Followed outpatient by Dr. Vaughan Browner.  Pulmonary team following completed course of prednisone and Levaquin.  But due to concerns of ILD flare, started on Solu-Medrol- cont IV until 3/26, transition to PO Prednisone 60mg  po daily until seen by outptn Pulm ?Appreciate Pulm input.  ? ? ?Malignant neoplasm of upper lobe of right lung ([place]) ?RUL adenocarcinoma of the lung s/p bronchoscopy and robotic resection on 03/03/21 by Dr. Kipp Brood and Dr. Valeta Harms later complicated by large pneumothorax on 2/14 requiring chest tube and developed pneumonia.   ? ? ? ?Essential hypertension, benign ?Resume Toprol and later Imdur if BP allows.  ? ? ?CAD (coronary artery disease), native coronary artery ?CAD s/p CABG; continue atorvastatin, Plavix ?Resume Toprol. ?Chest pain-free ? ?Anxiety ?Continue paxil and xanax prn  ? ?Low back pain ?Continue prn norco and gabapentin ? ?HLD (hyperlipidemia) ?Continue lipitor 80mg   ? ?BPH (benign prostatic hyperplasia) ?Continue Flomax daily ? ? ? ?Orthostasis ?Likely from intravascular volume depletion/poor oral intake.  Gentle hydration for 24 hours ? ? ? ? ?  ? ? ? ?DVT prophylaxis: Eliquis ?Code Status: Full ?Family Communication: Wife updated over the phone.  ? ?Status is: Inpatient ?Still quite dyspneic with minimal movement.  Continue IV Solu-Medrol until 3/26.  For transition to p.o. on 3/27.  Maintain hospital stay for at least next 48 hours.  Appreciate input from ? ?Subjective: ?Still has some exertional  dyspnea, but improvement.  ? ?Examination: ? ?Constitutional: Not in acute distress ?Respiratory: Clear to auscultation bilaterally ?Cardiovascular: Normal sinus rhythm, no rubs ?Abdomen: Nontender nondistended good bowel sounds ?Musculoskeletal: No edema noted ?Skin: No  rashes seen ?Neurologic: CN 2-12 grossly intact.  And nonfocal ?Psychiatric: Normal judgment and insight. Alert and oriented x 3. Normal mood.  ? ? ? ?Objective: ?Vitals:  ? 05/22/21 1807 05/22/21 1939 05/22/21 2311 05/23/21 0444  ?BP: 138/81 (!) 160/76 (!) 157/92 138/79  ?Pulse: 80 78 72 73  ?Resp: 20 20 20 20   ?Temp: 98.2 ?F (36.8 ?C) (!) 97.5 ?F (36.4 ?C) 98.3 ?F (36.8 ?C) 97.9 ?F (36.6 ?C)  ?TempSrc: Oral Oral Oral Oral  ?SpO2:  93% 98% 96%  ?Weight:      ?Height:      ? ? ?Intake/Output Summary (Last 24 hours) at 05/23/2021 0718 ?Last data filed at 05/23/2021 0600 ?Gross per 24 hour  ?Intake 0 ml  ?Output 2450 ml  ?Net -2450 ml  ? ?Filed Weights  ? 05/18/21 1700  ?Weight: 94.5 kg  ? ? ? ?Data Reviewed:  ? ?CBC: ?Recent Labs  ?Lab 05/18/21 ?1438 05/18/21 ?1456 05/19/21 ?0115 05/20/21 ?0143 05/21/21 ?0128 05/22/21 ?0101 05/23/21 ?0932  ?WBC 15.3*  --  12.0* 12.4* 14.9* 15.4* 17.9*  ?NEUTROABS 13.7*  --   --   --   --   --   --   ?HGB 14.5   < > 13.7 13.1 13.8 12.7* 13.5  ?HCT 42.5   < > 40.1 38.3* 40.1 37.8* 39.7  ?MCV 94.0  --  92.8 91.6 92.0 92.6 91.7  ?PLT 579*  --  413* 493* 557* 526* 569*  ? < > = values in this interval not displayed.  ? ?Basic Metabolic Panel: ?Recent Labs  ?Lab 05/18/21 ?1439 05/18/21 ?1456 05/19/21 ?0115 05/20/21 ?0143 05/21/21 ?0128 05/22/21 ?0101 05/23/21 ?6712  ?NA 136   < > 141 137 137 137 136  ?K 3.9   < > 3.6 3.7 4.0 4.4 4.3  ?CL 102   < > 105 106 103 105 102  ?CO2 22  --  25 23 25 25 24   ?GLUCOSE 141*   < > 119* 121* 133* 136* 117*  ?BUN 18   < > 14 13 18 15 15   ?CREATININE 0.97   < > 0.82 0.70 1.00 0.74 0.76  ?CALCIUM 9.2  --  9.1 8.9 9.2 8.8* 9.0  ?MG 2.0  --   --  2.2 2.0 2.0 2.0  ? < > = values in this interval not displayed.  ? ?GFR: ?Estimated Creatinine Clearance: 99.6 mL/min (by C-G formula based on SCr of 0.76 mg/dL). ?Liver Function Tests: ?Recent Labs  ?Lab 05/18/21 ?1439  ?AST 33  ?ALT 29  ?ALKPHOS 95  ?BILITOT 1.0  ?PROT 6.8  ?ALBUMIN 3.1*  ? ?No results for input(s):  LIPASE, AMYLASE in the last 168 hours. ?No results for input(s): AMMONIA in the last 168 hours. ?Coagulation Profile: ?No results for input(s): INR, PROTIME in the last 168 hours. ?Cardiac Enzymes: ?No results for input(s): CKTOTAL, CKMB, CKMBINDEX, TROPONINI in the last 168 hours. ?BNP (last 3 results) ?No results for input(s): PROBNP in the last 8760 hours. ?HbA1C: ?No results for input(s): HGBA1C in the last 72 hours. ?CBG: ?No results for input(s): GLUCAP in the last 168 hours. ?Lipid Profile: ?No results for input(s): CHOL, HDL, LDLCALC, TRIG, CHOLHDL, LDLDIRECT in the last 72 hours. ?Thyroid Function Tests: ?No results for input(s): TSH, T4TOTAL, FREET4, T3FREE, THYROIDAB  in the last 72 hours. ?Anemia Panel: ?No results for input(s): VITAMINB12, FOLATE, FERRITIN, TIBC, IRON, RETICCTPCT in the last 72 hours. ?Sepsis Labs: ?Recent Labs  ?Lab 05/18/21 ?1649 05/18/21 ?2041 05/19/21 ?0115  ?PROCALCITON  --   --  <0.10  ?LATICACIDVEN 1.5 1.8  --   ? ? ?Recent Results (from the past 240 hour(s))  ?Blood culture (routine x 2)     Status: None (Preliminary result)  ? Collection Time: 05/18/21  2:40 PM  ? Specimen: Right Antecubital; Blood  ?Result Value Ref Range Status  ? Specimen Description RIGHT ANTECUBITAL  Final  ? Special Requests   Final  ?  BOTTLES DRAWN AEROBIC AND ANAEROBIC Blood Culture adequate volume  ? Culture   Final  ?  NO GROWTH 4 DAYS ?Performed at [facility], [place] [address], [place], [place] [address] ?  ? Report Status PENDING  Incomplete  ?Resp Panel by RT-PCR (Flu A&B, Covid) Nasopharyngeal Swab     Status: None  ? Collection Time: 05/18/21  2:40 PM  ? Specimen: Nasopharyngeal Swab; Nasopharyngeal(NP) swabs in vial transport medium  ?Result Value Ref Range Status  ? SARS Coronavirus 2 by RT PCR NEGATIVE NEGATIVE Final  ?  Comment: (NOTE) ?SARS-CoV-2 target nucleic acids are NOT DETECTED. ? ?The SARS-CoV-2 RNA is generally detectable in upper respiratory ?specimens during the acute phase  of infection. The lowest ?concentration of SARS-CoV-2 viral copies this assay can detect is ?138 copies/mL. A negative result does not preclude SARS-Cov-2 ?infection and should not be used as the sole basis for t

## 2021-05-23 NOTE — Progress Notes (Signed)
Patient still very SOB with any activity. Mobility assisted patient to chair and his oxygen saturation dropped into the 70s. Oxygen increased to 4L for several minutes and patient recovered and saturations improved into the 90s. He was then decreased to 1L. RN entered room and nasal cannula was out of his nose, saturations were in the 70s. RN adjusted nasal cannula and increased oxygen to 4L until patient recovered. Saturations increased to 96% and oxygen decreased to 2L via nasal cannula. Fuller Canada, RN ? ?

## 2021-05-23 NOTE — Progress Notes (Signed)
Mobility Specialist Progress Note: ? ? 05/23/21 1615  ?Mobility  ?Activity Transferred from bed to chair  ?Level of Assistance Contact guard assist, steadying assist  ?Assistive Device None  ?Distance Ambulated (ft) 2 ft  ?Activity Response Tolerated poorly  ?$Mobility charge 1 Mobility  ? ? ?Pre Mobility: SpO2 94% 1LO2; BP 120/85 ?During Mobility: SpO2 79% 1LO2: BP 131/78 ?Post Mobility: SpO2 92% 1LO2- after 5 min of recovery on 4LO2. ? ?Pt agreeable to mobility, eager to progress. No physical assistance needed, minG for safety. BP stable throughout session, SpO2 dropping to a low of 79% upon transferring to chair. Required up to 4LO2 for ~5 min to recover while sitting up. Pt left in chair on 1LO2, SpO2 92%. NT in room. ? ?Nelta Numbers ?Acute Rehab ?Phone: [phone] ?Office Phone: [phone] ? ?

## 2021-05-23 NOTE — Progress Notes (Signed)
Patient SOB and saturations in 80s. PRN xanax, Norco and mucinex given. Patient placed back on 1L oxygen. Will attempt again later to wean off. Fuller Canada, RN ? ?

## 2021-05-23 NOTE — Assessment & Plan Note (Addendum)
--   Asymptomatic.  Slow position changes.  Consider risk/benefit of ongoing metoprolol.  Imdur discontinued for now.  Fall precautions.  Consider abdominal binder or TED hose. ?

## 2021-05-24 DIAGNOSIS — J9601 Acute respiratory failure with hypoxia: Secondary | ICD-10-CM | POA: Diagnosis not present

## 2021-05-24 DIAGNOSIS — I2693 Single subsegmental pulmonary embolism without acute cor pulmonale: Secondary | ICD-10-CM | POA: Diagnosis not present

## 2021-05-24 DIAGNOSIS — D72829 Elevated white blood cell count, unspecified: Secondary | ICD-10-CM | POA: Insufficient documentation

## 2021-05-24 DIAGNOSIS — J849 Interstitial pulmonary disease, unspecified: Secondary | ICD-10-CM | POA: Diagnosis not present

## 2021-05-24 DIAGNOSIS — I25110 Atherosclerotic heart disease of native coronary artery with unstable angina pectoris: Secondary | ICD-10-CM | POA: Diagnosis not present

## 2021-05-24 LAB — BASIC METABOLIC PANEL
Anion gap: 7 (ref 5–15)
BUN: 16 mg/dL (ref 8–23)
CO2: 27 mmol/L (ref 22–32)
Calcium: 9 mg/dL (ref 8.9–10.3)
Chloride: 104 mmol/L (ref 98–111)
Creatinine, Ser: 0.86 mg/dL (ref 0.61–1.24)
GFR, Estimated: >60 mL/min (ref >60)
Glucose, Bld: 126 mg/dL — ABNORMAL HIGH (ref 70–99)
Potassium: 4.5 mmol/L (ref 3.5–5.1)
Sodium: 138 mmol/L (ref 135–145)

## 2021-05-24 LAB — CBC
HCT: 41.6 % (ref 39.0–52.0)
Hemoglobin: 14.4 g/dL (ref 13.0–17.0)
MCH: 32.1 pg (ref 26.0–34.0)
MCHC: 34.6 g/dL (ref 30.0–36.0)
MCV: 92.7 fL (ref 80.0–100.0)
Platelets: 566 10*3/uL — ABNORMAL HIGH (ref 150–400)
RBC: 4.49 MIL/uL (ref 4.22–5.81)
RDW: 14.1 % (ref 11.5–15.5)
WBC: 15.9 10*3/uL — ABNORMAL HIGH (ref 4.0–10.5)
nRBC: 0 % (ref 0.0–0.2)

## 2021-05-24 LAB — MAGNESIUM: Magnesium: 2 mg/dL (ref 1.7–2.4)

## 2021-05-24 NOTE — TOC Progression Note (Signed)
Transition of Care (TOC) - Progression Note  ? ? ?Patient Details  ?Name: Richard Parrish ?MRN: [id number] ?Date of Birth: 1950/09/13 ? ?Transition of Care (TOC) CM/SW Contact  ?Vinie Sill, LCSW ?Phone Number: ?05/24/2021, 3:39 PM ? ?Clinical Narrative:    ? ?CSW met with patient at bedside - informed of bed offer w/ [facility]- patient accepted bed offer.  ? ?Per MD patient  may be ready for d/c 24-48 hrs. LVM with HTA/Tammie- informed of auth request for SNF and PTAR ? ?TOC will continue to follow and assist with discharge planning.  ? ?Thurmond Butts, MSW, LCSW ?Clinical Social Worker ? ? ?Expected Discharge Plan: [name] ?Barriers to Discharge: Continued Medical Work up, Ship broker, SNF Pending bed offer ? ?Expected Discharge Plan and Services ?Expected Discharge Plan: [place] ?In-house Referral: Clinical Social Work ?  ?Post Acute Care Choice: [place] ?Living arrangements for the past 2 months: [place] ?                ?  ?  ?  ?  ?  ?  ?  ?  ?  ?  ? ? ?Social Determinants of Health (SDOH) Interventions ?  ? ?Readmission Risk Interventions ? ?  05/22/2021  ?  9:37 AM 04/20/2021  ?  8:25 AM  ?Readmission Risk Prevention Plan  ?Transportation Screening Complete Complete  ?PCP or Specialist Appt within 5-7 Days  Complete  ?Home Care Screening  Complete  ?Medication Review (RN CM)  Complete  ?Medication Review Press photographer) Complete   ?PCP or Specialist appointment within 3-5 days of discharge Complete   ?[place] or Home Care Consult Complete   ?SW Recovery Care/Counseling Consult Complete   ?Palliative Care Screening Not Applicable   ?Skilled Nursing Facility Complete   ? ? ?

## 2021-05-24 NOTE — Progress Notes (Signed)
Occupational Therapy Treatment ?Patient Details ?Name: Richard Parrish ?MRN: [id number] ?DOB: 1950/07/29 ?Today's Date: 05/24/2021 ? ? ?History of present illness Richard Parrish is a 71 year old male admitted 05/18/21 admitted to the hospital for worsening SOB, weakness over the past week. Dx with submassive PE with right heart strain; left peroneal DVT (on heparin switched to Eliquis) Of note: About a month ago patient was diagnosed with pneumothorax requiring chest tube complicated by pneumonia. 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 ?  ?OT comments ? Pt up in chair. Participated in seated LB ADLs with Sp02 93-98% on 2L 02. Continued education in energy conservation strategies including having places for pt to sit and rest throughout his home, sitting to shower, wearing 02 in shower, pacing.  Pt reports yesterday was the first day he started feeling better and today is even better. Updated discharge recommendation to SNF for further rehab prior to return home with his supportive wife.   ? ?Recommendations for follow up therapy are one component of a multi-disciplinary discharge planning process, led by the attending physician.  Recommendations may be updated based on patient status, additional functional criteria and insurance authorization. ?   ?Follow Up Recommendations ? Skilled nursing-short term rehab (<3 hours/day)  ?  ?Assistance Recommended at Discharge Frequent or constant Supervision/Assistance  ?Patient can return home with the following ? A little help with walking and/or transfers;A little help with bathing/dressing/bathroom;Assistance with cooking/housework;Assist for transportation;Help with stairs or ramp for entrance ?  ?Equipment Recommendations ? None recommended by OT  ?  ?Recommendations for Other Services   ? ?  ?Precautions / Restrictions Precautions ?Precautions: Fall;Other  (comment) ?Precaution Comments: watch 02 and BP (orthostatic on eval) ?Restrictions ?Weight Bearing Restrictions: No  ? ? ?  ? ?Mobility Bed Mobility ?  ?  ?  ?  ?  ?  ?  ?General bed mobility comments: in chair ?  ? ?Transfers ?Overall transfer level: Needs assistance ?Equipment used: Rolling walker (2 wheels) ?Transfers: Sit to/from Stand ?Sit to Stand: Min guard ?  ?  ?  ?  ?  ?  ?  ?  ?Balance Overall balance assessment: Needs assistance ?Sitting-balance support: Feet supported, No upper extremity supported ?Sitting balance-Leahy Scale: Good ?  ?  ?  ?Standing balance-Leahy Scale: Poor ?Standing balance comment: Min guard A and RW ?  ?  ?  ?  ?  ?  ?  ?  ?  ?  ?  ?   ? ?ADL either performed or assessed with clinical judgement  ? ?ADL Overall ADL's : Needs assistance/impaired ?  ?  ?  ?  ?  ?  ?Lower Body Bathing: Minimal assistance;Sitting/lateral leans ?  ?  ?  ?Lower Body Dressing: Sitting/lateral leans;Set up ?  ?  ?  ?  ?  ?  ?  ?  ?General ADL Comments: Pt up in chair. Completed LB ADLs at chair level. Educated in energy conservation strategies. ?  ? ?Extremity/Trunk Assessment   ?  ?  ?  ?  ?  ? ?Vision   ?  ?  ?Perception   ?  ?Praxis   ?  ? ?Cognition Arousal/Alertness: Awake/alert ?Behavior During Therapy: [facility] for tasks assessed/performed ?Overall Cognitive Status: Within Functional Limits for tasks assessed ?  ?  ?  ?  ?  ?  ?  ?  ?  ?  ?  ?  ?  ?  ?  ?  ?  General Comments: HOH ?  ?  ?   ?Exercises   ? ?  ?Shoulder Instructions   ? ? ?  ?General Comments    ? ? ?Pertinent Vitals/ Pain       Pain Assessment ?Pain Assessment: No/denies pain ? ?Home Living   ?  ?  ?  ?  ?  ?  ?  ?  ?  ?  ?  ?  ?  ?  ?  ?  ?  ?  ? ?  ?Prior Functioning/Environment    ?  ?  ?  ?   ? ?Frequency ? Min 2X/week  ? ? ? ? ?  ?Progress Toward Goals ? ?OT Goals(current goals can now be found in the care plan section) ? Progress towards OT goals: Progressing toward goals ? ?Acute Rehab OT Goals ?OT Goal Formulation: With  patient/family ?Time For Goal Achievement: 06/04/21 ?Potential to Achieve Goals: Good  ?Plan Discharge plan needs to be updated   ? ?Co-evaluation ? ? ?   ?  ?  ?  ?  ? ?  ?AM-PAC OT "6 Clicks" Daily Activity     ?Outcome Measure ? ? Help from another person eating meals?: None ?Help from another person taking care of personal grooming?: A Little ?Help from another person toileting, which includes using toliet, bedpan, or urinal?: A Little ?Help from another person bathing (including washing, rinsing, drying)?: A Little ?Help from another person to put on and taking off regular upper body clothing?: A Little ?Help from another person to put on and taking off regular lower body clothing?: A Little ?6 Click Score: 19 ? ?  ?End of Session Equipment Utilized During Treatment: Rolling walker (2 wheels);Oxygen (2L) ? ?OT Visit Diagnosis: Unsteadiness on feet (R26.81);Pain ?  ?Activity Tolerance Patient tolerated treatment well ?  ?Patient Left in chair;with call bell/phone within reach;with chair alarm set;with family/visitor present ?  ?Nurse Communication   ?  ? ?   ? ?Time: 5537-4827 ?OT Time Calculation (min): 35 min ? ?Charges: OT General Charges ?$OT Visit: 1 Visit ?OT Treatments ?$Self Care/Home Management : 23-37 mins ? ?Nestor Lewandowsky, OTR/L ?Acute Rehabilitation Services ?Pager: [phone] ?Office: [phone]  ? ?Malka So ?05/24/2021, 11:47 AM ?

## 2021-05-24 NOTE — Progress Notes (Signed)
? ?  NAME:  JAKOB KIMBERLIN, MRN:  [id number], DOB:  01-31-1951, LOS: 6 ?ADMISSION DATE:  05/18/2021, CONSULTATION DATE:  05/18/2021 ?REFERRING MD:  Gilford Raid, CHIEF COMPLAINT:  Submassive PE   ? ?History of Present Illness:  ?Richard Parrish is a 71 y.o. male with a PMH significant for CAD s/p PCI, HTN, HLD, pulmonary fibrosis, OSA on CPAP, PVD, and anxiety who presented to the ED due to complaints of worsening shortness of breath and weakness (despite recent treatment with Levaquin and prednisone for presumed CAP) ? ?On ED arrival patient was seen mildly hypotensive with blood pressure 89/69 and RR was elevated at 32. CTA chest was preformed and revealed moderate left sided PE with RV/LV ratio of 0.9. abd CT evidence of right heart strain. Given extensive underly pulmonary history with new PE, PCCM was consulted for pulm consult.  ? ?Pertinent  Medical History  ?CAD s/p PCI, HTN, HLD, pulmonary fibrosis, OSA on CPAP, PVD, and anxiety  ? ?Significant Hospital Events: ?Including procedures, antibiotic start and stop dates in addition to other pertinent events   ?3/21 Presented to the ED for complaints of worsening SOB and weakness found to have submassive PE on CTA  ?3/23 patient is on 5L O2 via [place], Echo shows mildly reduced RV function ? ?Interim History / Subjective:  ? ?Stable. Feels breathing is better ? ?Objective   ?Blood pressure (!) 172/95, pulse 69, temperature (!) 97.5 ?F (36.4 ?C), temperature source Oral, resp. rate 17, height 6' (1.829 m), weight 94.5 kg, SpO2 96 %. ?   ?   ? ?Intake/Output Summary (Last 24 hours) at 05/24/2021 1003 ?Last data filed at 05/24/2021 0400 ?Gross per 24 hour  ?Intake 1088.42 ml  ?Output 1325 ml  ?Net -236.58 ml  ? ?Filed Weights  ? 05/18/21 1700  ?Weight: 94.5 kg  ? ? ?Examination: ?Blood pressure (!) 172/95, pulse 69, temperature (!) 97.5 ?F (36.4 ?C), temperature source Oral, resp. rate 17, height 6' (1.829 m), weight 94.5 kg, SpO2 96 %. ?Gen:      No acute distress ?HEENT:   EOMI, sclera anicteric ?Neck:     No masses; no thyromegaly ?Lungs:   Bibasal crackles ?CV:         Regular rate and rhythm; no murmurs ?Abd:      + bowel sounds; soft, non-tender; no palpable masses, no distension ?Ext:    No edema; adequate peripheral perfusion ?Skin:      Warm and dry; no rash ?Neuro: alert and oriented x 3 ?Psych: normal mood and affect  ? ? ?Resolved Hospital Problem list   ? ? ?Assessment & Plan:  ?Submassive PE with CT evidence of right heart strain ?Pulmonary fibrosis with acute flare ?History of right upper lobe adenocarcinoma s/p RUL lobectomy ?Hx of recurrent right pneumothorax, right lower lobe consolidation  ? ?P: ?Anticoagulation via eliquis ?Wean down oxygen ?IV Solu-Medrol changed to 60 mg prednisone today. ?Bactrim for prophylaxis ?Physical therapy eval and ambulatory oxygen assessment ? ? ?Best Practice (right click and "Reselect all SmartList Selections" daily)  ?Per primary  ? ?Critical care time: n/a  ? ?Marshell Garfinkel MD ?[facility] ?See Amion for pager ? ?If no response to pager , please call [phone] until 7pm ?After 7:00 pm call [place]  [phone] ?05/24/2021, 10:13 AM  ? ? ? ? ? ?

## 2021-05-24 NOTE — Progress Notes (Signed)
?PROGRESS NOTE ? ? ? ?Richard Parrish  KDT:[id number] DOB: 1950-03-09 DOA: 05/18/2021 ?PCP: Seward Carol, MD  ? 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  About a month ago patient was diagnosed with pneumothorax requiring chest tube complicated by pneumonia and eventually discharged on 7 days of Augmentin.  When he saw pulmonary about a week ago he was given a course of prednisone, Z-Pak and later switched to Levaquin.  Last day of Levaquin 05/20/2021.   ? ?Upon this admission he was diagnosed with submassive PE with right heart strain, seen by pulmonary team.  Currently on heparin drip not a candidate for thrombolytics.  Lower extremity Dopplers is positive for left peroneal DVT.  Heparin drip transition to Eliquis.  Echocardiogram overall unremarkable with EF of 65% and normal RV.  Patient was also started on IV Solu-Medrol for ILD flare. ? ? ?Assessment & Plan: ? Principal Problem: ?  Pulmonary embolism ([place]) ?Active Problems: ?  Acute respiratory failure with hypoxia ([place]) ?  Pulmonary fibrosis ([place]) ?  Malignant neoplasm of upper lobe of right lung ([place]) ?  Essential hypertension, benign ?  CAD (coronary artery disease), native coronary artery ?  Anxiety ?  Low back pain ?  HLD (hyperlipidemia) ?  BPH (benign prostatic hyperplasia) ?  Orthostasis ?  Leukocytosis ?  ? ? ?Assessment and Plan: ?* Pulmonary embolism ([place]) ?Secondary to submassive PE, continue supplemental oxygen.  CT showing right heart strain, echocardiogram shows normal EF and RV. ?Lower extremity Dopplers-positive for left peroneal vein DVT. ?Pain medication ?Continue Eliquis ? ? ?Acute respiratory failure with hypoxia ([place]) ?Secondary to submassive PE and pulmonary fibrosis.  Along with complicated pulmonary  history still requiring 2-3 L nasal cannula.  Anticipate will require some form of oxygen at home.  Still has some shortness of breath with mobility ?Continue Eliquis twice daily ? ? ? ? ?Pulmonary fibrosis ([place]) ?Followed outpatient by Dr. Vaughan Browner.  Pulmonary team following completed course of prednisone and Levaquin.  But due to concerns of ILD flare, was on IV Solu-Medrol until 3/26.  Now on p.o. prednisone 60 mg daily until seen by outpatient pulm. ?Appreciate Pulm input.  ? ? ?Malignant neoplasm of upper lobe of right lung ([place]) ?RUL adenocarcinoma of the lung s/p bronchoscopy and robotic resection on 03/03/21 by Dr. Kipp Brood and Dr. Valeta Harms later complicated by large pneumothorax on 2/14 requiring chest tube and developed pneumonia.   ? ? ? ?Essential hypertension, benign ?Resume Toprol and later Imdur if BP allows or if he develops chest pain.  ? ? ?CAD (coronary artery disease), native coronary artery ?CAD s/p CABG; continue atorvastatin, Plavix ?Resume Toprol. ?Chest pain-free ? ?Anxiety ?Continue paxil and xanax prn  ? ?Low back pain ?Continue prn norco and gabapentin ? ?HLD (hyperlipidemia) ?Continue lipitor 80mg   ? ?BPH (benign prostatic hyperplasia) ?Continue Flomax daily ? ? ? ?Leukocytosis ?Secondary to steroid use ? ?Orthostasis ?Likely from intravascular volume depletion/poor oral intake.  Gentle hydration for 24 hours ? ? ? ?PT-OT is recommended SNF therefore TOC consulted ?  ? ? ? ?DVT prophylaxis: Eliquis ?Code Status: Full ?Family Communication: Wife at bedside ? ?Status is: Inpatient ?Still has exertional dyspnea but improved.  PT recommended SNF therefore TOC to help arrange ? ?Subjective ?Sitting up in the chair feeling little better.  Able to ambulate a little bit in the room but did get short of breath. ?Examination: ? ?Constitutional: Not in acute distress ?Respiratory: Clear to auscultation bilaterally ?Cardiovascular: Normal sinus rhythm, no rubs ?Abdomen: Nontender nondistended good bowel  sounds ?Musculoskeletal: No edema noted ?Skin: No rashes seen ?Neurologic: CN 2-12 grossly intact.  And nonfocal ?Psychiatric: Normal judgment and insight. Alert and oriented x 3. Normal mood.  ? ? ? ?Objective: ?Vitals:  ? 05/24/21 0007 05/24/21 0426 05/24/21 0719 05/24/21 1053  ?BP: 134/88  (!) 172/95 113/79  ?Pulse: 65  69 72  ?Resp: 18  17 18   ?Temp: 97.9 ?F (36.6 ?C) 97.6 ?F (36.4 ?C) (!) 97.5 ?F (36.4 ?C) (!) 97.5 ?F (36.4 ?C)  ?TempSrc: Oral Oral Oral Oral  ?SpO2: 98%  96% 93%  ?Weight:      ?Height:      ? ? ?Intake/Output Summary (Last 24 hours) at 05/24/2021 1219 ?Last data filed at 05/24/2021 1054 ?Gross per 24 hour  ?Intake 1088.42 ml  ?Output 1925 ml  ?Net -836.58 ml  ? ?Filed Weights  ? 05/18/21 1700  ?Weight: 94.5 kg  ? ? ? ?Data Reviewed:  ? ?CBC: ?Recent Labs  ?Lab 05/18/21 ?1438 05/18/21 ?1456 05/20/21 ?0143 05/21/21 ?0128 05/22/21 ?0101 05/23/21 ?3244 05/24/21 ?0503  ?WBC 15.3*   < > 12.4* 14.9* 15.4* 17.9* 15.9*  ?NEUTROABS 13.7*  --   --   --   --   --   --   ?HGB 14.5   < > 13.1 13.8 12.7* 13.5 14.4  ?HCT 42.5   < > 38.3* 40.1 37.8* 39.7 41.6  ?MCV 94.0   < > 91.6 92.0 92.6 91.7 92.7  ?PLT 579*   < > 493* 557* 526* 569* 566*  ? < > = values in this interval not displayed.  ? ?Basic Metabolic Panel: ?Recent Labs  ?Lab 05/20/21 ?0143 05/21/21 ?0128 05/22/21 ?0101 05/23/21 ?0102 05/24/21 ?0503  ?NA 137 137 137 136 138  ?K 3.7 4.0 4.4 4.3 4.5  ?CL 106 103 105 102 104  ?CO2 23 25 25 24 27   ?GLUCOSE 121* 133* 136* 117* 126*  ?BUN 13 18 15 15 16   ?CREATININE 0.70 1.00 0.74 0.76 0.86  ?CALCIUM 8.9 9.2 8.8* 9.0 9.0  ?MG 2.2 2.0 2.0 2.0 2.0  ? ?GFR: ?Estimated Creatinine Clearance: 92.7 mL/min (by C-G formula based on SCr of 0.86 mg/dL). ?Liver Function Tests: ?Recent Labs  ?Lab 05/18/21 ?1439  ?AST 33  ?ALT 29  ?ALKPHOS 95  ?BILITOT 1.0  ?PROT 6.8  ?ALBUMIN 3.1*  ? ?No results for input(s): LIPASE, AMYLASE in the last 168 hours. ?No results for input(s): AMMONIA in the last 168 hours. ?Coagulation  Profile: ?No results for input(s): INR, PROTIME in the last 168 hours. ?Cardiac Enzymes: ?No results for input(s): CKTOTAL, CKMB, CKMBINDEX, TROPONINI in the last 168 hours. ?BNP (last 3 results) ?No results for input(s): PROBNP in the last 8760 hours. ?HbA1C: ?No results for input(s): HGBA1C in the last 72 hours. ?CBG: ?No results for input(s): GLUCAP in the last 168 hours. ?Lipid Profile: ?No results for input(s): CHOL, HDL, LDLCALC, TRIG, CHOLHDL, LDLDIRECT in the last 72 hours. ?Thyroid Function Tests: ?No results for input(s): TSH, T4TOTAL, FREET4, T3FREE, THYROIDAB in the last 72 hours. ?Anemia Panel: ?No results for input(s): VITAMINB12, FOLATE, FERRITIN, TIBC, IRON, RETICCTPCT in the last 72 hours. ?Sepsis Labs: ?Recent Labs  ?Lab 05/18/21 ?1649 05/18/21 ?2041 05/19/21 ?0115  ?PROCALCITON  --   --  <0.10  ?LATICACIDVEN 1.5  1.8  --   ? ? ?Recent Results (from the past 240 hour(s))  ?Blood culture (routine x 2)     Status: None  ? Collection Time: 05/18/21  2:40 PM  ? Specimen: Right Antecubital; Blood  ?Result Value Ref Range Status  ? Specimen Description RIGHT ANTECUBITAL  Final  ? Special Requests   Final  ?  BOTTLES DRAWN AEROBIC AND ANAEROBIC Blood Culture adequate volume  ? Culture   Final  ?  NO GROWTH 5 DAYS ?Performed at [facility], [place] [address] [place], [place] [address] ?  ? Report Status 05/23/2021 FINAL  Final  ?Resp Panel by RT-PCR (Flu A&B, Covid) Nasopharyngeal Swab     Status: None  ? Collection Time: 05/18/21  2:40 PM  ? Specimen: Nasopharyngeal Swab; Nasopharyngeal(NP) swabs in vial transport medium  ?Result Value Ref Range Status  ? SARS Coronavirus 2 by RT PCR NEGATIVE NEGATIVE Final  ?  Comment: (NOTE) ?SARS-CoV-2 target nucleic acids are NOT DETECTED. ? ?The SARS-CoV-2 RNA is generally detectable in upper respiratory ?specimens during the acute phase of infection. The lowest ?concentration of SARS-CoV-2 viral copies this assay can detect is ?138 copies/mL. A negative result  does not preclude SARS-Cov-2 ?infection and should not be used as the sole basis for treatment or ?other patient management decisions. A negative result may occur with  ?improper specimen collection/handling, subm

## 2021-05-24 NOTE — Assessment & Plan Note (Signed)
Secondary to steroid use ?

## 2021-05-24 NOTE — Progress Notes (Signed)
Physical Therapy Treatment ?Patient Details ?Name: Richard Parrish ?MRN: [id number] ?DOB: 01-21-1951 ?Today's Date: 05/24/2021 ? ? ?History of Present Illness Richard Parrish is a 71 year old male admitted 05/18/21 admitted to the hospital for worsening SOB, weakness over the past week. Dx with submassive PE with right heart strain; left peroneal DVT (on heparin switched to Eliquis) Of note: About a month ago patient was diagnosed with pneumothorax requiring chest tube complicated by pneumonia. 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 ? ?  ?PT Comments  ? ? Pt progressing slowly towards his physical therapy goals; remains motivated to participate. Session focused on therapeutic exercises, seated ADL's at sink and progressive mobility. Pt remains orthostatic with positional changes, but is asymptomatic. SpO2 93% on 2L O2 at rest, desat to 72% sitting edge of bed; requires 6L O2 for mobility and increased time to rebound. Pt continues with significantly decreased cardiopulmonary endurance and weakness. D/c plan remains appropriate. ? ?Vitals: ?Supine: 157/94 (111) ?Sitting: 139/96 (108) ?Standing: 108/72 (84) ?   ?Recommendations for follow up therapy are one component of a multi-disciplinary discharge planning process, led by the attending physician.  Recommendations may be updated based on patient status, additional functional criteria and insurance authorization. ? ?Follow Up Recommendations ? Skilled nursing-short term rehab (<3 hours/day) ?  ?  ?Assistance Recommended at Discharge Frequent or constant Supervision/Assistance  ?Patient can return home with the following Help with stairs or ramp for entrance;Assistance with cooking/housework;A little help with bathing/dressing/bathroom;A little help with walking and/or transfers ?  ?Equipment Recommendations ? None recommended by PT  ?  ?Recommendations for  Other Services   ? ? ?  ?Precautions / Restrictions Precautions ?Precautions: Fall;Other (comment) ?Precaution Comments: watch 02 and BP (orthostatic on eval) ?Restrictions ?Weight Bearing Restrictions: No  ?  ? ?Mobility ? Bed Mobility ?Overal bed mobility: Needs Assistance ?Bed Mobility: Sit to Supine ?  ?  ?  ?Sit to supine: Supervision ?  ?General bed mobility comments: no physical assist ?  ? ?Transfers ?Overall transfer level: Needs assistance ?Equipment used: Rolling walker (2 wheels) ?Transfers: Sit to/from Stand ?Sit to Stand: Min guard ?  ?  ?  ?  ?  ?General transfer comment: reminders for hand placement ?  ? ?Ambulation/Gait ?Ambulation/Gait assistance: Min guard ?Gait Distance (Feet): 20 Feet (5 ft, then 15 ft) ?Assistive device: Rolling walker (2 wheels) ?Gait Pattern/deviations: Step-through pattern, Decreased stride length ?Gait velocity: decreased ?Gait velocity interpretation: <1.8 ft/sec, indicate of risk for recurrent falls ?  ?General Gait Details: Fatigues easily, min guard for safety ? ? ?Stairs ?  ?  ?  ?  ?  ? ? ?Wheelchair Mobility ?  ? ?Modified Rankin (Stroke Patients Only) ?  ? ? ?  ?Balance Overall balance assessment: Needs assistance ?Sitting-balance support: Feet supported, No upper extremity supported ?Sitting balance-Leahy Scale: Good ?  ?  ?Standing balance support: During functional activity, Reliant on assistive device for balance, Bilateral upper extremity supported ?Standing balance-Leahy Scale: Poor ?  ?  ?  ?  ?  ?  ?  ?  ?  ?  ?  ?  ?  ? ?  ?Cognition Arousal/Alertness: Awake/alert ?Behavior During Therapy: [facility] for tasks assessed/performed ?Overall Cognitive Status: Within Functional Limits for tasks assessed ?  ?  ?  ?  ?  ?  ?  ?  ?  ?  ?  ?  ?  ?  ?  ?  ?  General Comments: Needs reminders to conserve energy ?  ?  ? ?  ?Exercises General Exercises - Lower Extremity ?Ankle Circles/Pumps: Both, 10 reps, Seated ?Long Arc Quad: Both, 10 reps, Seated ?Hip Flexion/Marching: Both,  20 reps, Seated ? ?  ?General Comments   ?  ?  ? ?Pertinent Vitals/Pain Pain Assessment ?Pain Assessment: Faces ?Faces Pain Scale: No hurt  ? ? ?Home Living   ?  ?  ?  ?  ?  ?  ?  ?  ?  ?   ?  ?Prior Function    ?  ?  ?   ? ?PT Goals (current goals can now be found in the care plan section) Acute Rehab PT Goals ?Patient Stated Goal: to go to rehab and then get home to my dogs ?Potential to Achieve Goals: Good ? ?  ?Frequency ? ? ? Min 3X/week ? ? ? ?  ?PT Plan Current plan remains appropriate  ? ? ?Co-evaluation   ?  ?  ?  ?  ? ?  ?AM-PAC PT "6 Clicks" Mobility   ?Outcome Measure ? Help needed turning from your back to your side while in a flat bed without using bedrails?: None ?Help needed moving from lying on your back to sitting on the side of a flat bed without using bedrails?: A Little ?Help needed moving to and from a bed to a chair (including a wheelchair)?: A Little ?Help needed standing up from a chair using your arms (e.g., wheelchair or bedside chair)?: A Little ?Help needed to walk in hospital room?: A Little ?Help needed climbing 3-5 steps with a railing? : A Lot ?6 Click Score: 18 ? ?  ?End of Session Equipment Utilized During Treatment: Gait belt;Oxygen ?Activity Tolerance: Patient tolerated treatment well ?Patient left: with call bell/phone within reach;in chair;with family/visitor present ?Nurse Communication: Mobility status ?PT Visit Diagnosis: Muscle weakness (generalized) (M62.81);Difficulty in walking, not elsewhere classified (R26.2) ?  ? ? ?Time: 6294-7654 ?PT Time Calculation (min) (ACUTE ONLY): 42 min ? ?Charges:  $Therapeutic Activity: 38-52 mins          ?          ? ?Wyona Almas, PT, DPT ?Acute Rehabilitation Services ?Pager [phone] ?Office [phone] ? ? ? ?Deno Etienne ?05/24/2021, 11:12 AM ? ?

## 2021-05-25 DIAGNOSIS — J849 Interstitial pulmonary disease, unspecified: Secondary | ICD-10-CM | POA: Diagnosis not present

## 2021-05-25 DIAGNOSIS — I25110 Atherosclerotic heart disease of native coronary artery with unstable angina pectoris: Secondary | ICD-10-CM | POA: Diagnosis not present

## 2021-05-25 DIAGNOSIS — J9601 Acute respiratory failure with hypoxia: Secondary | ICD-10-CM | POA: Diagnosis not present

## 2021-05-25 DIAGNOSIS — I2693 Single subsegmental pulmonary embolism without acute cor pulmonale: Secondary | ICD-10-CM | POA: Diagnosis not present

## 2021-05-25 LAB — CBC
HCT: 41.1 % (ref 39.0–52.0)
Hemoglobin: 13.8 g/dL (ref 13.0–17.0)
MCH: 30.8 pg (ref 26.0–34.0)
MCHC: 33.6 g/dL (ref 30.0–36.0)
MCV: 91.7 fL (ref 80.0–100.0)
Platelets: 593 10*3/uL — ABNORMAL HIGH (ref 150–400)
RBC: 4.48 MIL/uL (ref 4.22–5.81)
RDW: 14.3 % (ref 11.5–15.5)
WBC: 19.5 10*3/uL — ABNORMAL HIGH (ref 4.0–10.5)
nRBC: 0 % (ref 0.0–0.2)

## 2021-05-25 LAB — BASIC METABOLIC PANEL
Anion gap: 6 (ref 5–15)
BUN: 24 mg/dL — ABNORMAL HIGH (ref 8–23)
CO2: 25 mmol/L (ref 22–32)
Calcium: 8.7 mg/dL — ABNORMAL LOW (ref 8.9–10.3)
Chloride: 106 mmol/L (ref 98–111)
Creatinine, Ser: 0.76 mg/dL (ref 0.61–1.24)
GFR, Estimated: >60 mL/min (ref >60)
Glucose, Bld: 106 mg/dL — ABNORMAL HIGH (ref 70–99)
Potassium: 3.9 mmol/L (ref 3.5–5.1)
Sodium: 137 mmol/L (ref 135–145)

## 2021-05-25 LAB — MAGNESIUM: Magnesium: 1.9 mg/dL (ref 1.7–2.4)

## 2021-05-25 MED ORDER — PREDNISONE 20 MG PO TABS
50.0000 mg | ORAL_TABLET | Freq: Every day | ORAL | Status: DC
  Administered 2021-05-26: 50 mg via ORAL
  Filled 2021-05-25: qty 1

## 2021-05-25 MED ORDER — SULFAMETHOXAZOLE-TRIMETHOPRIM 400-80 MG PO TABS: 1.0000 | ORAL_TABLET | ORAL | Status: DC

## 2021-05-25 NOTE — Progress Notes (Signed)
Physical Therapy Treatment ?Patient Details ?Name: Richard Parrish ?MRN: [id number] ?DOB: Mar 16, 1950 ?Today's Date: 05/25/2021 ? ? ?History of Present Illness Richard Parrish is a 71 year old male admitted 05/18/21 admitted to the hospital for worsening SOB, weakness over the past week. Dx with submassive PE with right heart strain; left peroneal DVT (on heparin switched to Eliquis) Of note: About a month ago patient was diagnosed with pneumothorax requiring chest tube complicated by pneumonia. 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 ? ?  ?PT Comments  ? ? Pt received in supine, pleasantly agreeable to therapy session, spouse present and encouraging. Pt remains dyspneic on exertion, SpO2 with unreliable signal but desat with wheezing with transfer to EOB from supine, pt placed on 2L O2 [place] and reading 90% and greater, then with standing SpO2 desat to <80%, O2 increased to 4L/min but also desat with exertion, pt needed 6L/min for short distance gait tasks x2 and pre-gait marching activity. Frequent seated breaks with wheezing/DOE, pt denies dizziness but limited to short distances due to dyspnea/fatigue, emphasis on activity pacing/energy conservation. Pt continues to benefit from PT services to progress toward functional mobility goals.    ?Recommendations for follow up therapy are one component of a multi-disciplinary discharge planning process, led by the attending physician.  Recommendations may be updated based on patient status, additional functional criteria and insurance authorization. ? ?Follow Up Recommendations ? Skilled nursing-short term rehab (<3 hours/day) ?  ?  ?Assistance Recommended at Discharge Frequent or constant Supervision/Assistance  ?Patient can return home with the following Help with stairs or ramp for entrance;Assistance with cooking/housework;A little help with  bathing/dressing/bathroom;A little help with walking and/or transfers ?  ?Equipment Recommendations ? None recommended by PT  ?  ?Recommendations for Other Services   ? ? ?  ?Precautions / Restrictions Precautions ?Precautions: Fall;Other (comment) ?Precaution Comments: watch 02 and BP (orthostatic but sometimes asymptomatic) ?Restrictions ?Weight Bearing Restrictions: No  ?  ? ?Mobility ? Bed Mobility ?Overal bed mobility: Needs Assistance ?Bed Mobility: Supine to Sit ?  ?  ?Supine to sit: Supervision ?  ?  ?General bed mobility comments: increased time, HOB elevated, assist with lines/leads ?  ? ?Transfers ?Overall transfer level: Needs assistance ?Equipment used: Rolling walker (2 wheels) ?Transfers: Sit to/from Stand ?Sit to Stand: Min guard, Supervision ?  ?  ?  ?  ?  ?General transfer comment: reminders for hand placement with sitting and standing transfers, needs hand over hand assist to reach back prior to sitting on EOB but Supervision for sit<>stand from recliner ?  ? ?Ambulation/Gait ?Ambulation/Gait assistance: Min guard ?Gait Distance (Feet): 20 Feet (36ft, seated break, 58ft) ?Assistive device: Rolling walker (2 wheels) ?Gait Pattern/deviations: Step-through pattern, Decreased stride length, Drifts right/left ?  ?  ?Pre-gait activities: standing hip flexion x10-20 reps x 3 sets ?General Gait Details: Fatigues easily, min guard for safety, no dizziness reported but pt states "I know when I need to sit". DOE, SpO2 noisy signal desat to ~80% on 2-4L [place], desat to 86% with marching/amb on 6L improves with seated break ? ? ?Stairs ?  ?  ?  ?  ?  ? ? ?Wheelchair Mobility ?  ? ?Modified Rankin (Stroke Patients Only) ?  ? ? ?  ?Balance Overall balance assessment: Needs assistance ?Sitting-balance support: Feet supported, No upper extremity supported ?Sitting balance-Leahy Scale: Good ?  ?  ?Standing balance support: Reliant on assistive device  for balance, During functional activity ?Standing balance-Leahy  Scale: Poor ?Standing balance comment: Min guard A and RW; static standing with U UE support no LOB ?  ?   ?Cognition Arousal/Alertness: Awake/alert ?Behavior During Therapy: [facility] for tasks assessed/performed ?Overall Cognitive Status: Within Functional Limits for tasks assessed ?  ?  ?  ?  ?  ?  ?General Comments: HOH, decreased recall of safety cues within session, tangential needs redirection to task. ?  ?  ? ?  ?Exercises General Exercises - Lower Extremity ?Ankle Circles/Pumps: Both, 10 reps, Seated ?Heel Slides: AROM, Both, 20 reps, Supine ?Hip ABduction/ADduction: AROM, Both, 20 reps, Supine ?Hip Flexion/Marching: AROM, Both, 20 reps, Standing (x10-20 reps x3 sets) ? ?  ?General Comments General comments (skin integrity, edema, etc.): SpO2 90-91% on RA in supine (HOB elevated), once repositioned to EOB SpO2 desat to 89% on RA, on 2L O2 [place] WFL seated/talking, standing SpO2 desat on 2L <86% so increased to 4L, desat to <88% with standing hip flexion, increased to 6L/min SpO2 86-100% with exertion. Pt remains on 2L O2 [place] resting in recliner ?  ?  ? ?Pertinent Vitals/Pain Pain Assessment ?Pain Assessment: No/denies pain ?Pain Intervention(s): Monitored during session, Repositioned  ? ? ?   ?   ? ?PT Goals (current goals can now be found in the care plan section) Acute Rehab PT Goals ?Patient Stated Goal: to go to rehab and then get home to my dogs ?PT Goal Formulation: With patient/family ?Time For Goal Achievement: 06/04/21 ?Progress towards PT goals: Progressing toward goals ? ?  ?Frequency ? ? ? Min 3X/week ? ? ? ?  ?PT Plan Current plan remains appropriate  ? ? ?   ?AM-PAC PT "6 Clicks" Mobility   ?Outcome Measure ? Help needed turning from your back to your side while in a flat bed without using bedrails?: A Little ?Help needed moving from lying on your back to sitting on the side of a flat bed without using bedrails?: A Little ?Help needed moving to and from a bed to a chair (including a wheelchair)?: A  Little ?Help needed standing up from a chair using your arms (e.g., wheelchair or bedside chair)?: A Lot (mod cues for safe UE placement each time) ?Help needed to walk in hospital room?: A Lot (mod cues for safety/breathing) ?Help needed climbing 3-5 steps with a railing? : A Lot (anticipate mod cues needed) ?6 Click Score: 15 ? ?  ?End of Session Equipment Utilized During Treatment: Gait belt;Oxygen ?Activity Tolerance: Patient tolerated treatment well ?Patient left: in chair;with call bell/phone within reach;with family/visitor present ?Nurse Communication: Mobility status;Other (comment) (needed 6L O2  with exertion, noisy pleth signal throughout (pt has Raynaud's and ear sensor also not very accurate)) ?PT Visit Diagnosis: Muscle weakness (generalized) (M62.81);Difficulty in walking, not elsewhere classified (R26.2) ?  ? ? ?Time: 1030-1109 ?PT Time Calculation (min) (ACUTE ONLY): 39 min ? ?Charges:  $Gait Training: 8-22 mins ?$Therapeutic Exercise: 8-22 mins ?$Therapeutic Activity: 8-22 mins          ?          ? ?Malvika Tung P., PTA ?Acute Rehabilitation Services ?Pager: [phone] ?Office: [phone]  ? ? ?Kara Pacer Maeva Dant ?05/25/2021, 11:30 AM ? ?

## 2021-05-25 NOTE — Progress Notes (Signed)
?PROGRESS NOTE ? ? ? ?Richard Parrish  AOZ:[id number] DOB: Jul 23, 1950 DOA: 05/18/2021 ?PCP: Seward Carol, MD  ? 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  About a month ago patient was diagnosed with pneumothorax requiring chest tube complicated by pneumonia and eventually discharged on 7 days of Augmentin.  When he saw pulmonary about a week ago he was given a course of prednisone, Z-Pak and later switched to Levaquin.  Last day of Levaquin 05/20/2021.   ? ?Upon this admission he was diagnosed with submassive PE with right heart strain, seen by pulmonary team.  Currently on heparin drip not a candidate for thrombolytics.  Lower extremity Dopplers is positive for left peroneal DVT.  Heparin drip transition to Eliquis.  Echocardiogram overall unremarkable with EF of 65% and normal RV.  He was also started on IV Solu-Medrol for ILD flare which has now been transitioned to prednisone. ? ? ?Assessment & Plan: ? Principal Problem: ?  Pulmonary embolism ([place]) ?Active Problems: ?  Acute respiratory failure with hypoxia ([place]) ?  Pulmonary fibrosis ([place]) ?  Malignant neoplasm of upper lobe of right lung ([place]) ?  Essential hypertension, benign ?  CAD (coronary artery disease), native coronary artery ?  Anxiety ?  Low back pain ?  HLD (hyperlipidemia) ?  BPH (benign prostatic hyperplasia) ?  Orthostasis ?  Leukocytosis ?  ? ? ?Assessment and Plan: ?* Pulmonary embolism ([place]) ?Secondary to submassive PE, continue supplemental oxygen.  CT showing right heart strain, echocardiogram shows normal EF and RV. ?Lower extremity Dopplers-positive for left peroneal vein DVT. ?Pain medication ?Continue Eliquis ? ? ?Acute respiratory failure with hypoxia ([place]) ?Secondary to submassive PE and pulmonary  fibrosis.  Along with complicated pulmonary history still requiring 2 L nasal cannula.  Anticipate will require some form of oxygen at home.  Still has some shortness of breath with mobility ?Continue Eliquis twice daily ? ? ? ? ?Pulmonary fibrosis ([place]) ?Followed outpatient by Dr. Vaughan Browner.  Pulmonary team following completed course of prednisone and Levaquin.  But due to concerns of ILD flare, was on IV Solu-Medrol until 3/26.  Now on p.o. prednisone 60 mg daily until seen by outpatient pulm. ?Appreciate Pulm input.  ? ? ?Malignant neoplasm of upper lobe of right lung ([place]) ?RUL adenocarcinoma of the lung s/p bronchoscopy and robotic resection on 03/03/21 by Dr. Kipp Brood and Dr. Valeta Harms later complicated by large pneumothorax on 2/14 requiring chest tube and developed pneumonia.   ? ? ? ?Essential hypertension, benign ?Resume Toprol and later Imdur if BP allows or if he develops chest pain.  ? ? ?CAD (coronary artery disease), native coronary artery ?CAD s/p CABG; continue atorvastatin, Plavix ?Resume Toprol. ?Chest pain-free ? ?Anxiety ?Continue paxil and xanax prn  ? ?Low back pain ?Continue prn norco and gabapentin ? ?HLD (hyperlipidemia) ?Continue lipitor 80mg   ? ?BPH (benign prostatic hyperplasia) ?Continue Flomax daily ? ? ? ?Leukocytosis ?Secondary to steroid use ? ?Orthostasis ?Resolved with ivf ? ? ? ?PT-OT is recommended SNF therefore TOC consulted.  Hopefully we can transition him to SNF soon. ?  ? ? ? ?DVT prophylaxis: Eliquis ?Code Status: Full ?Family Communication: Wife is at bedside ? ?Status is: Inpatient ?Mild exertional dyspnea but continues to improve.  PT has recommended SNF therefore TOC working on arrangements.   ? ?  Subjective ?No acute events overnight.  Breathing symptoms are greatly improved.  Ambulating in the room with minimal shortness of breath. ? ?Examination: ? ?Constitutional: Not in acute distress ?Respiratory: Clear to auscultation bilaterally ?Cardiovascular: Normal sinus rhythm, no  rubs ?Abdomen: Nontender nondistended good bowel sounds ?Musculoskeletal: No edema noted ?Skin: No rashes seen ?Neurologic: CN 2-12 grossly intact.  And nonfocal ?Psychiatric: Normal judgment and insight. Alert and oriented x 3. Normal mood.  ? ? ? ?Objective: ?Vitals:  ? 05/24/21 2007 05/24/21 2251 05/25/21 0345 05/25/21 5188  ?BP: (!) 142/82 131/86 124/88 134/87  ?Pulse: 76 80 68 79  ?Resp: 20 20 17 20   ?Temp: (!) 97.5 ?F (36.4 ?C) 97.9 ?F (36.6 ?C)  97.7 ?F (36.5 ?C)  ?TempSrc: Oral Oral  Oral  ?SpO2: 98% 98% 100% 100%  ?Weight:      ?Height:      ? ? ?Intake/Output Summary (Last 24 hours) at 05/25/2021 0753 ?Last data filed at 05/25/2021 4166 ?Gross per 24 hour  ?Intake 0 ml  ?Output 2350 ml  ?Net -2350 ml  ? ?Filed Weights  ? 05/18/21 1700  ?Weight: 94.5 kg  ? ? ? ?Data Reviewed:  ? ?CBC: ?Recent Labs  ?Lab 05/18/21 ?1438 05/18/21 ?1456 05/21/21 ?0128 05/22/21 ?0101 05/23/21 ?0630 05/24/21 ?0503 05/25/21 ?0257  ?WBC 15.3*   < > 14.9* 15.4* 17.9* 15.9* 19.5*  ?NEUTROABS 13.7*  --   --   --   --   --   --   ?HGB 14.5   < > 13.8 12.7* 13.5 14.4 13.8  ?HCT 42.5   < > 40.1 37.8* 39.7 41.6 41.1  ?MCV 94.0   < > 92.0 92.6 91.7 92.7 91.7  ?PLT 579*   < > 557* 526* 569* 566* 593*  ? < > = values in this interval not displayed.  ? ?Basic Metabolic Panel: ?Recent Labs  ?Lab 05/21/21 ?0128 05/22/21 ?0101 05/23/21 ?1601 05/24/21 ?0503 05/25/21 ?0257  ?NA 137 137 136 138 137  ?K 4.0 4.4 4.3 4.5 3.9  ?CL 103 105 102 104 106  ?CO2 25 25 24 27 25   ?GLUCOSE 133* 136* 117* 126* 106*  ?BUN 18 15 15 16  24*  ?CREATININE 1.00 0.74 0.76 0.86 0.76  ?CALCIUM 9.2 8.8* 9.0 9.0 8.7*  ?MG 2.0 2.0 2.0 2.0 1.9  ? ?GFR: ?Estimated Creatinine Clearance: 99.6 mL/min (by C-G formula based on SCr of 0.76 mg/dL). ?Liver Function Tests: ?Recent Labs  ?Lab 05/18/21 ?1439  ?AST 33  ?ALT 29  ?ALKPHOS 95  ?BILITOT 1.0  ?PROT 6.8  ?ALBUMIN 3.1*  ? ?No results for input(s): LIPASE, AMYLASE in the last 168 hours. ?No results for input(s): AMMONIA in the last  168 hours. ?Coagulation Profile: ?No results for input(s): INR, PROTIME in the last 168 hours. ?Cardiac Enzymes: ?No results for input(s): CKTOTAL, CKMB, CKMBINDEX, TROPONINI in the last 168 hours. ?BNP (last 3 results) ?No results for input(s): PROBNP in the last 8760 hours. ?HbA1C: ?No results for input(s): HGBA1C in the last 72 hours. ?CBG: ?No results for input(s): GLUCAP in the last 168 hours. ?Lipid Profile: ?No results for input(s): CHOL, HDL, LDLCALC, TRIG, CHOLHDL, LDLDIRECT in the last 72 hours. ?Thyroid Function Tests: ?No results for input(s): TSH, T4TOTAL, FREET4, T3FREE, THYROIDAB in the last 72 hours. ?Anemia Panel: ?No results for input(s): VITAMINB12, FOLATE, FERRITIN, TIBC, IRON, RETICCTPCT in the last 72 hours. ?Sepsis Labs: ?Recent Labs  ?Lab 05/18/21 ?1649 05/18/21 ?2041 05/19/21 ?0115  ?PROCALCITON  --   --  <0.10  ?  LATICACIDVEN 1.5 1.8  --   ? ? ?Recent Results (from the past 240 hour(s))  ?Blood culture (routine x 2)     Status: None  ? Collection Time: 05/18/21  2:40 PM  ? Specimen: Right Antecubital; Blood  ?Result Value Ref Range Status  ? Specimen Description RIGHT ANTECUBITAL  Final  ? Special Requests   Final  ?  BOTTLES DRAWN AEROBIC AND ANAEROBIC Blood Culture adequate volume  ? Culture   Final  ?  NO GROWTH 5 DAYS ?Performed at [facility], [place] [address] [place],  [address] ?  ? Report Status 05/23/2021 FINAL  Final  ?Resp Panel by RT-PCR (Flu A&B, Covid) Nasopharyngeal Swab     Status: None  ? Collection Time: 05/18/21  2:40 PM  ? Specimen: Nasopharyngeal Swab; Nasopharyngeal(NP) swabs in vial transport medium  ?Result Value Ref Range Status  ? SARS Coronavirus 2 by RT PCR NEGATIVE NEGATIVE Final  ?  Comment: (NOTE) ?SARS-CoV-2 target nucleic acids are NOT DETECTED. ? ?The SARS-CoV-2 RNA is generally detectable in upper respiratory ?specimens during the acute phase of infection. The lowest ?concentration of SARS-CoV-2 viral copies this assay can detect is ?138  copies/mL. A negative result does not preclude SARS-Cov-2 ?infection and should not be used as the sole basis for treatment or ?other patient management decisions. A negative result may occur with  ?improper spec

## 2021-05-25 NOTE — Care Management Important Message (Signed)
Important Message ? ?Patient Details  ?Name: Richard Parrish ?MRN: [id number] ?Date of Birth: Aug 19, 1950 ? ? ?Medicare Important Message Given:  Yes ? ? ? ? ?Shelda Altes ?05/25/2021, 8:53 AM ?

## 2021-05-25 NOTE — Progress Notes (Signed)
Mobility Specialist Progress Note ? ? 05/25/21 1800  ?Orthostatic Lying   ?BP- Lying 112/85  ?Orthostatic Sitting  ?BP- Sitting 134/76  ?Orthostatic Standing at 0 minutes  ?BP- Standing at 0 minutes (!) 71/48  ?Orthostatic Standing at 3 minutes  ?BP- Standing at 3 minutes (!) 85/55  ?Oxygen Therapy  ?SpO2 98 %  ?O2 Device Nasal Cannula  ?Mobility  ?Activity Stood at bedside  ?Level of Assistance Standby assist, set-up cues, supervision of patient - no hands on  ?Assistive Device Front wheel walker  ?Activity Response Tolerated well  ?$Mobility charge 1 Mobility  ? ? ?Pre Mobility: 85 HR, 112/85 BP , 97% SpO2 on RA  ?During Mobility: 95 HR, 71/48 BP, 98% SpO2 on 2LO2 ?Post Mobility: 75 HR, 100% SpO2 on RA  ? ?Session limited to pt's BP today. Received pt on RA stating no pain throughout session but had present signs of dyspnea and fatigue when transitioning through orthostatic positions. Upon standing pt's BP dropped and did not rise in an appropriate range to continue w/ the mobility specialist. Advised by RN to let pt rest even though pt stated having no symptoms w/ low BP's. Left w/ pt in supine and call bell in reach. Will f/u tomorrow.   ? ?Holland Falling ?Mobility Specialist ?Phone Number [phone] ? ?

## 2021-05-25 NOTE — TOC Progression Note (Addendum)
Transition of Care (TOC) - Progression Note  ? ? ?Patient Details  ?Name: Richard Parrish ?MRN: [id number] ?Date of Birth: 02-Feb-1951 ? ?Transition of Care (TOC) CM/SW Contact  ?Coralee Pesa, LCSWA ?Phone Number: ?05/25/2021, 10:11 AM ? ?Clinical Narrative:    ?3:00 Authorization approved. ?SNF [id number] good for 7 days ?Amb 93593 good for 90 days ? ?CSW received a call from Health team who confirmed they have started authorization for [facility]  and should have it by this afternoon. TOC will continue to follow for DC needs. ? ? ?Expected Discharge Plan: [place] ?Barriers to Discharge: Continued Medical Work up, Ship broker, SNF Pending bed offer ? ?Expected Discharge Plan and Services ?Expected Discharge Plan: [place] ?In-house Referral: Clinical Social Work ?  ?Post Acute Care Choice: [place] ?Living arrangements for the past 2 months: [place] ?                ?  ?  ?  ?  ?  ?  ?  ?  ?  ?  ? ? ?Social Determinants of Health (SDOH) Interventions ?  ? ?Readmission Risk Interventions ? ?  05/22/2021  ?  9:37 AM 04/20/2021  ?  8:25 AM  ?Readmission Risk Prevention Plan  ?Transportation Screening Complete Complete  ?PCP or Specialist Appt within 5-7 Days  Complete  ?Home Care Screening  Complete  ?Medication Review (RN CM)  Complete  ?Medication Review Press photographer) Complete   ?PCP or Specialist appointment within 3-5 days of discharge Complete   ?[place] or Home Care Consult Complete   ?SW Recovery Care/Counseling Consult Complete   ?Palliative Care Screening Not Applicable   ?Skilled Nursing Facility Complete   ? ? ?

## 2021-05-25 NOTE — Progress Notes (Signed)
? ?NAME:  Richard Parrish, MRN:  [id number], DOB:  [birth year], LOS: 7 ?ADMISSION DATE:  05/18/2021, CONSULTATION DATE:  05/18/2021 ?REFERRING MD:  Gilford Raid, CHIEF COMPLAINT:  Submassive PE   ? ?History of Present Illness:  ?Richard Parrish is a 71 y.o. male with a PMH significant for CAD s/p PCI, HTN, HLD, pulmonary fibrosis, OSA on CPAP, PVD, and anxiety who presented to the ED due to complaints of worsening shortness of breath and weakness (despite recent treatment with Levaquin and prednisone for presumed CAP) ? ?On ED arrival patient was seen mildly hypotensive with blood pressure 89/69 and RR was elevated at 32. CTA chest was preformed and revealed moderate left sided PE with RV/LV ratio of 0.9. abd CT evidence of right heart strain. Given extensive underly pulmonary history with new PE, PCCM was consulted for pulm consult.  ? ?Pertinent  Medical History  ?CAD s/p PCI, HTN, HLD, pulmonary fibrosis, OSA on CPAP, PVD, and anxiety  ? ?Significant Hospital Events: ?Including procedures, antibiotic start and stop dates in addition to other pertinent events   ?3/21 Presented to the ED for complaints of worsening SOB and weakness found to have submassive PE on CTA  ?3/23 patient is on 5L O2 via [place], Echo shows mildly reduced RV function ? ?Interim History / Subjective:  ? ?Stable. Feels breathing is better ? ?Objective   ?Blood pressure 105/70, pulse 71, temperature 97.6 ?F (36.4 ?C), temperature source Oral, resp. rate 18, height 6' (1.829 m), weight 94.5 kg, SpO2 96 %. ?   ?   ? ?Intake/Output Summary (Last 24 hours) at 05/25/2021 1430 ?Last data filed at 05/25/2021 1141 ?Gross per 24 hour  ?Intake 240 ml  ?Output 2150 ml  ?Net -1910 ml  ? ?Filed Weights  ? 05/18/21 1700  ?Weight: 94.5 kg  ? ? ?Examination: ?Blood pressure 105/70, pulse 71, temperature 97.6 ?F (36.4 ?C), temperature source Oral, resp. rate 18, height 6' (1.829 m), weight 94.5 kg, SpO2 96 %. ?Gen:      No acute distress ?HEENT:  EOMI, sclera  anicteric ?Neck:     No masses; no thyromegaly ?Lungs:    Clear to auscultation bilaterally; normal respiratory effort ?CV:         Regular rate and rhythm; no murmurs ?Abd:      + bowel sounds; soft, non-tender; no palpable masses, no distension ?Ext:    No edema; adequate peripheral perfusion ?Skin:      Warm and dry; no rash ?Neuro: alert and oriented x 3 ?Psych: normal mood and affect  ? ? ?Resolved Hospital Problem list   ? ? ?Assessment & Plan:  ?Submassive PE with CT evidence of right heart strain ?Pulmonary fibrosis with acute flare ?History of right upper lobe adenocarcinoma s/p RUL lobectomy ?Hx of recurrent right pneumothorax, right lower lobe consolidation  ? ?P: ?Anticoagulation via eliquis ?Wean down oxygen ?Reduce prednisone to 50 mg and hold at that dose till he is seen back in office. Next appointment is on 06/03/21 ?Bactrim for prophylaxis ?Physical therapy eval and ambulatory oxygen assessment ? ?Anticipate discharge to SNF in the next day or 2.  PCCM will be available as needed. Please call with questions. ? ?Best Practice (right click and "Reselect all SmartList Selections" daily)  ?Per primary  ? ?Critical care time: n/a  ? ?Marshell Garfinkel MD ?[facility] ?See Amion for pager ? ?If no response to pager , please call [phone] until 7pm ?After 7:00 pm call Elink  [phone] ?05/25/2021, 2:30 PM  ? ? ? ? ? ?

## 2021-05-26 ENCOUNTER — Encounter (HOSPITAL_COMMUNITY): Payer: Self-pay | Admitting: Family Medicine

## 2021-05-26 DIAGNOSIS — I2699 Other pulmonary embolism without acute cor pulmonale: Secondary | ICD-10-CM

## 2021-05-26 DIAGNOSIS — I82452 Acute embolism and thrombosis of left peroneal vein: Secondary | ICD-10-CM

## 2021-05-26 DIAGNOSIS — J439 Emphysema, unspecified: Secondary | ICD-10-CM

## 2021-05-26 LAB — BASIC METABOLIC PANEL
Anion gap: 11 (ref 5–15)
BUN: 21 mg/dL (ref 8–23)
CO2: 22 mmol/L (ref 22–32)
Calcium: 9 mg/dL (ref 8.9–10.3)
Chloride: 102 mmol/L (ref 98–111)
Creatinine, Ser: 0.68 mg/dL (ref 0.61–1.24)
GFR, Estimated: >60 mL/min (ref >60)
Glucose, Bld: 126 mg/dL — ABNORMAL HIGH (ref 70–99)
Potassium: 4.3 mmol/L (ref 3.5–5.1)
Sodium: 135 mmol/L (ref 135–145)

## 2021-05-26 LAB — CBC
HCT: 42.2 % (ref 39.0–52.0)
Hemoglobin: 14.6 g/dL (ref 13.0–17.0)
MCH: 31.7 pg (ref 26.0–34.0)
MCHC: 34.6 g/dL (ref 30.0–36.0)
MCV: 91.7 fL (ref 80.0–100.0)
Platelets: 596 10*3/uL — ABNORMAL HIGH (ref 150–400)
RBC: 4.6 MIL/uL (ref 4.22–5.81)
RDW: 14.2 % (ref 11.5–15.5)
WBC: 16.3 10*3/uL — ABNORMAL HIGH (ref 4.0–10.5)
nRBC: 0 % (ref 0.0–0.2)

## 2021-05-26 LAB — MAGNESIUM: Magnesium: 2 mg/dL (ref 1.7–2.4)

## 2021-05-26 MED ORDER — PREDNISONE 50 MG PO TABS: 50.0000 mg | ORAL_TABLET | Freq: Every day | ORAL | Status: DC

## 2021-05-26 MED ORDER — METOPROLOL SUCCINATE ER 50 MG PO TB24
25.0000 mg | ORAL_TABLET | Freq: Every day | ORAL | Status: DC
Start: 2021-05-26 — End: 2022-04-27

## 2021-05-26 MED ORDER — APIXABAN 5 MG PO TABS: 5.0000 mg | ORAL_TABLET | Freq: Two times a day (BID) | ORAL | Status: DC

## 2021-05-26 MED ORDER — SULFAMETHOXAZOLE-TRIMETHOPRIM 400-80 MG PO TABS: 1.0000 | ORAL_TABLET | ORAL | Status: DC

## 2021-05-26 MED ORDER — HYDROCODONE-ACETAMINOPHEN 10-325 MG PO TABS
1.0000 | ORAL_TABLET | ORAL | 0 refills | Status: DC | PRN
Start: 2021-05-26 — End: 2021-09-20

## 2021-05-26 MED ORDER — ACETAMINOPHEN 500 MG PO TABS: 500.0000 mg | ORAL_TABLET | Freq: Four times a day (QID) | ORAL | Status: AC | PRN

## 2021-05-26 MED ORDER — ALPRAZOLAM 0.5 MG PO TABS: 0.2500 mg | ORAL_TABLET | Freq: Three times a day (TID) | ORAL | 0 refills | Status: AC | PRN

## 2021-05-26 NOTE — Assessment & Plan Note (Signed)
--   Prednisone 50 mg until follow-up in the office with pulmonology, next appointment 4/6.  Bactrim for prophylaxis. ?

## 2021-05-26 NOTE — Progress Notes (Signed)
05/26/2021 7:11 PM  ?Pt discharged to [facility] via [facility]. ?Richard Parrish C ? ?

## 2021-05-26 NOTE — Progress Notes (Signed)
Mobility Specialist Progress Note ? ? 05/26/21 1500  ?Mobility  ?Activity Ambulated with assistance in room;Stood at bedside  ?Level of Assistance Standby assist, set-up cues, supervision of patient - no hands on  ?Assistive Device Front wheel walker  ?Distance Ambulated (ft) 48 ft  ?Activity Response Tolerated well  ?$Mobility charge 1 Mobility  ? ?Pre Mobility: 79 HR, 127/84 BP, 96% SpO2 on 2L ?During Mobility: 108 HR, 94% SpO2 on 2L ?Post Mobility: 81HR, 97% SpO2 on 2L ? ?Pt received in bed and agreeable to mobility. C/o no pain but fatiguing quickly accompanied by DOE during mobility. Pt to bed after session with all needs met and ficus on pursed lip breathing when.  ? ? ?Richard Parrish ?Mobility Specialist ?Phone Number [phone] ?Post Mobility: HR, BP, SpO2 ? ?Richard Parrish ?Mobility Specialist ?Phone Number [phone] ? ?

## 2021-05-26 NOTE — Discharge Summary (Signed)
?Physician Discharge Summary ?  ?Patient: Richard Parrish MRN: [id number] DOB: 1950-07-20  ?Admit date:     05/18/2021  ?Discharge date: 05/26/21  ?Discharge Physician: Murray Hodgkins  ? ?PCP: Seward Carol, MD  ? ?Recommendations at discharge:  ? ?New dx acute PE and LLE DVT, now on apixaban ?ILD flare, discharged on prednisone 50mg  until follow-up with pulmonology ?Acute hypoxic respiratory failure, requiring oxygen on discharge ?Flomax discontinued given ongoing orthostasis. Consider TED hose. Recommend slow position changes. ? ?Discharge Diagnoses: ? ? ?Hospital Course: ?71 year old man history of right upper lobe adenocarcinoma, pulmonary fibrosis, presented with shortness of breath.  Diagnosed with PE.  Seen by pulmonology, treated with anticoagulation with gradual clinical improvement, also treated for interstitial lung disease flare with steroids.  Assessed by therapy with recommendation for SNF.  ? ?Assessment and Plan: ?* Pulmonary embolism ([place]) ?--Secondary to submassive PE.  CT showing right heart strain, echocardiogram shows normal EF and RV. ?-- Acute hypoxic respiratory failure associated.  Lower extremity Dopplers-positive for left peroneal vein DVT. ?--Continue Eliquis, wean oxygen as tolerated.  Follow-up with pulmonology as an outpatient. ? ?Acute deep vein thrombosis (DVT) of left peroneal vein ([facility]) ?--continue oral anticoagulation ? ?ILD (interstitial lung disease) ([place]) ?-- Prednisone 50 mg until follow-up in the office with pulmonology, next appointment 4/6.  Bactrim for prophylaxis. ? ?Acute respiratory failure with hypoxia ([place]) ?--Secondary to submassive PE complicated by pulmonary fibrosis ?-- Will need oxygen on discharge.   ? ?Pulmonary fibrosis ([place]) ?-- With acute flare.  Followed outpatient by Dr. Vaughan Browner.   ?-- Per pulmonology continue prednisone 50 mg until follow-up 4/6 in the.  Bactrim for prophylaxis.   ? ?Orthostasis ?-- Asymptomatic.  Slow position changes.  Consider  risk/benefit of ongoing metoprolol.  Imdur discontinued for now.  Fall precautions.  Consider abdominal binder or TED hose. ? ?Malignant neoplasm of upper lobe of right lung ([place]) ?--RUL adenocarcinoma of the lung s/p bronchoscopy and robotic resection on 03/03/21 by Dr. Kipp Brood and Dr. Valeta Harms later complicated by large pneumothorax on 2/14 requiring chest tube and developed pneumonia.   ? ?Essential hypertension, benign ?-- Has orthostatic hypotension, for now continue metoprolol but assess risk/benefit as outpatient. ? ?CAD (coronary artery disease), native coronary artery ?--CAD s/p CABG; continue atorvastatin, Plavix ? ?Anxiety ?--Continue paxil and xanax prn  ? ?Low back pain ?--Continue prn norco and gabapentin ? ?HLD (hyperlipidemia) ?--Continue lipitor 80mg   ? ?BPH (benign prostatic hyperplasia) ?-- Hold Flomax for now given orthostatic hypotension.  Assess risk/benefit as outpatient ? ? ?  ? ?Pain control - [employer] Controlled Substance Reporting System database was reviewed. and patient was instructed, not to drive, operate heavy machinery, perform activities at heights, swimming or participation in water activities or provide baby-sitting services while on Pain, Sleep and Anxiety Medications; until their outpatient Physician has advised to do so again. Also recommended to not to take more than prescribed Pain, Sleep and Anxiety Medications.  ?Consultants: pulmonary medicine ?Procedures performed: none  ?Disposition: Skilled nursing facility ?Diet recommendation:  ?Regular diet ?DISCHARGE MEDICATION: ?Allergies as of 05/26/2021   ? ?   Reactions  ? Ambien [zolpidem Tartrate] Other (See Comments)  ? Bad dreams/[place]  ? ?  ? ?  ?Medication List  ?  ? ?STOP taking these medications   ? ?budesonide 3 MG 24 hr capsule ?Commonly known as: ENTOCORT EC ?  ?isosorbide mononitrate 30 MG 24 hr tablet ?Commonly known as: IMDUR ?  ?levofloxacin 750 MG tablet ?Commonly known as: LEVAQUIN ?  ?  metaxalone 800 MG  tablet ?Commonly known as: SKELAXIN ?  ?tamsulosin 0.4 MG Caps capsule ?Commonly known as: FLOMAX ?  ?Zithromax Z-Pak 250 MG tablet ?Generic drug: azithromycin ?  ? ?  ? ?TAKE these medications   ? ?acetaminophen 500 MG tablet ?Commonly known as: TYLENOL ?Take 1 tablet (500 mg total) by mouth every 6 (six) hours as needed for mild pain. ?What changed: reasons to take this ?  ?ALPRAZolam 0.5 MG tablet ?Commonly known as: Duanne Moron ?Take 0.5-1 tablets (0.25-0.5 mg total) by mouth 3 (three) times daily as needed for anxiety or sleep. ?  ?antiseptic oral rinse Liqd ?15 mLs by Mouth Rinse route daily as needed for dry mouth. ?  ?apixaban 5 MG Tabs tablet ?Commonly known as: ELIQUIS ?Take 1 tablet (5 mg total) by mouth 2 (two) times daily. ?  ?atorvastatin 80 MG tablet ?Commonly known as: LIPITOR ?TAKE 1 TABLET BY MOUTH EVERY DAY ?  ?cholecalciferol 25 MCG (1000 UNIT) tablet ?Commonly known as: VITAMIN D3 ?Take 1,000 Units by mouth in the morning. ?  ?clopidogrel 75 MG tablet ?Commonly known as: PLAVIX ?TAKE 1 TABLET BY MOUTH EVERY DAY ?  ?gabapentin 100 MG capsule ?Commonly known as: NEURONTIN ?Take 2 capsules (200 mg total) by mouth 3 (three) times daily. ?  ?guaiFENesin 600 MG 12 hr tablet ?Commonly known as: [place] ?Take 1 tablet (600 mg total) by mouth 2 (two) times daily as needed. ?What changed: reasons to take this ?  ?HYDROcodone-acetaminophen 10-325 MG tablet ?Commonly known as: NORCO ?Take 1 tablet by mouth every 4 (four) hours as needed for moderate pain or severe pain. ?  ?hyoscyamine 0.125 MG tablet ?Commonly known as: LEVSIN ?Take 0.125 mg by mouth every 4 (four) hours as needed for cramping. ?  ?metoprolol succinate 50 MG 24 hr tablet ?Commonly known as: TOPROL-XL ?Take 0.5 tablets (25 mg total) by mouth daily. Take with or immediately following a meal. ?What changed: See the new instructions. ?  ?nitroGLYCERIN 0.4 MG SL tablet ?Commonly known as: NITROSTAT ?Place 0.4 mg under the tongue every 5 (five)  minutes as needed for chest pain. ?  ?pantoprazole 40 MG tablet ?Commonly known as: PROTONIX ?TAKE 1 TABLET BY MOUTH DAILY BEFORE BREAKFAST ?  ?PARoxetine 40 MG tablet ?Commonly known as: PAXIL ?Take 40 mg by mouth in the morning. ?  ?predniSONE 50 MG tablet ?Commonly known as: DELTASONE ?Take 1 tablet (50 mg total) by mouth daily with breakfast. ?Start taking on: May 27, 2021 ?What changed:  ?medication strength ?how much to take ?  ?PROBIOTIC PO ?Take 1 capsule by mouth daily. ?  ?sodium chloride 0.65 % Soln nasal spray ?Commonly known as: OCEAN ?Place 1 spray into both nostrils daily as needed for congestion. ?  ?sulfamethoxazole-trimethoprim 400-80 MG tablet ?Commonly known as: BACTRIM ?Take 1 tablet by mouth 3 (three) times a week. ?Start taking on: May 28, 2021 ?  ? ?  ? ? Follow-up Information   ? ? Marshell Garfinkel, MD Follow up on 06/03/2021.   ?Specialty: Pulmonary Disease ?Contact information: ?[address] ?[address] ?[place] [address] ?[phone] ? ? ?  ?  ? ?  ?  ? ?  ? ?Feels better, still DOE but recovering faster ? ?Discharge Exam: ?Filed Weights  ? 05/18/21 1700  ?Weight: 94.5 kg  ? ?Physical Exam ?Vitals reviewed.  ?Constitutional:   ?   General: He is not in acute distress. ?   Appearance: He is not ill-appearing or toxic-appearing.  ?Cardiovascular:  ?  Rate and Rhythm: Normal rate and regular rhythm.  ?   Heart sounds: No murmur heard. ?Pulmonary:  ?   Effort: Pulmonary effort is normal. No respiratory distress.  ?   Breath sounds: No wheezing, rhonchi or rales.  ?Neurological:  ?   Mental Status: He is alert.  ?Psychiatric:     ?   Mood and Affect: Mood normal.     ?   Behavior: Behavior normal.  ? ? ? ?Condition at discharge: good ? ?The results of significant diagnostics from this hospitalization (including imaging, microbiology, ancillary and laboratory) are listed below for reference.  ? ?Imaging Studies: ?DG Chest 2 View ? ?Result Date: 05/14/2021 ?CLINICAL DATA:  Pneumonia and  pneumothorax follow-up EXAM: CHEST - 2 VIEW COMPARISON:  05/10/2021 FINDINGS: Worsening opacification of right lung and left lung base. No significant pleural effusion. No pneumothorax. Similar cardiomediastinal cont

## 2021-05-26 NOTE — Hospital Course (Addendum)
71 year old man history of right upper lobe adenocarcinoma, pulmonary fibrosis, presented with shortness of breath.  Diagnosed with PE.  Seen by pulmonology, treated with anticoagulation with gradual clinical improvement, also treated for interstitial lung disease flare with steroids.  Assessed by therapy with recommendation for SNF.  ?

## 2021-05-26 NOTE — Assessment & Plan Note (Signed)
--  continue oral anticoagulation ?

## 2021-05-26 NOTE — TOC Transition Note (Signed)
Transition of Care (TOC) - CM/SW Discharge Note ? ? ?Patient Details  ?Name: Richard Parrish ?MRN: [id number] ?Date of Birth: [birth year] ? ?Transition of Care (TOC) CM/SW Contact:  ?Vinie Sill, LCSW ?Phone Number: ?05/26/2021, 2:38 PM ? ? ?Clinical Narrative:    ? ?Patient will Discharge YP:[id number] PG ?Discharge Date: 05/26/2021 ?Family Notified: spouse  ?Transport IZ:TIWP ? ?Per MD patient is ready for discharge. RN, patient, and facility notified of discharge. Discharge Summary sent to facility. RN given number for [phone]. Ambulance transport requested for patient.  ? ?Clinical Social Worker signing off. ? ?Thurmond Butts, MSW, LCSW ?Clinical Social Worker ? ? ? ? ?Final next level of care: [place] ?Barriers to Discharge: Barriers Resolved ? ? ?Patient Goals and CMS Choice ?Patient states their goals for this hospitalization and ongoing recovery are:: Rehab ?CMS Medicare.gov Compare Post Acute Care list provided to:: Patient ?Choice offered to / list presented to : Patient ? ?Discharge Placement ?  ?           ?Patient chooses bed at: [facility], [facility] ?Patient to be transferred to facility by: [facility] ?Name of family member notified: spouse ?Patient and family notified of of transfer: 05/26/21 ? ?Discharge Plan and Services ?In-house Referral: Clinical Social Work ?  ?Post Acute Care Choice: [facility]          ?  ?  ?  ?  ?  ?  ?  ?  ?  ?  ? ?Social Determinants of Health (SDOH) Interventions ?  ? ? ?Readmission Risk Interventions ? ?  05/22/2021  ?  9:37 AM 04/20/2021  ?  8:25 AM  ?Readmission Risk Prevention Plan  ?Transportation Screening Complete Complete  ?PCP or Specialist Appt within 5-7 Days  Complete  ?Home Care Screening  Complete  ?Medication Review (RN CM)  Complete  ?Medication Review Press photographer) Complete   ?PCP or Specialist appointment within 3-5 days of discharge Complete   ?[place] or Home Care Consult Complete   ?SW Recovery Care/Counseling  Consult Complete   ?Palliative Care Screening Not Applicable   ?Skilled Nursing Facility Complete   ? ? ? ? ? ?

## 2021-05-27 ENCOUNTER — Other Ambulatory Visit: Payer: Self-pay | Admitting: *Deleted

## 2021-05-27 NOTE — Progress Notes (Signed)
The proposed treatment discussed in conference is for discussion purpose only and is not a binding recommendation. The patient was not physically examined, or presented with their treatment options. Therefore, final treatment plans cannot be decided.  ?

## 2021-06-03 ENCOUNTER — Inpatient Hospital Stay: Payer: PPO | Admitting: Primary Care

## 2021-06-14 ENCOUNTER — Telehealth: Payer: Self-pay | Admitting: Pulmonary Disease

## 2021-06-14 NOTE — Telephone Encounter (Signed)
Called the pt and there was no answer- LMTCB    

## 2021-06-15 NOTE — Telephone Encounter (Signed)
Called and spoke with wife [name] who states that patient was recently released from rehab and was not given enough Eliquis. They are requesting to have a RX sent into [facility] pharmacy[facility] [place] ?

## 2021-06-16 ENCOUNTER — Telehealth: Payer: Self-pay | Admitting: Pulmonary Disease

## 2021-06-16 MED ORDER — APIXABAN 5 MG PO TABS: 5.0000 mg | ORAL_TABLET | Freq: Two times a day (BID) | ORAL | 0 refills | Status: DC

## 2021-06-16 MED ORDER — APIXABAN 5 MG PO TABS: 5.0000 mg | ORAL_TABLET | Freq: Two times a day (BID) | ORAL | 1 refills | Status: DC

## 2021-06-16 NOTE — Telephone Encounter (Signed)
Called and spoke with [place]. I told her Dr. Vaughan Browner has sent in the refill for eliquis. [name] verified her pharmacy.  ? ?Nothing further needed.  ?

## 2021-06-16 NOTE — Telephone Encounter (Signed)
I have sent in a refill for the medication to his pharmacy ?

## 2021-06-16 NOTE — Telephone Encounter (Signed)
Called and spoke with [place]. She states that the patient has been without his eliquis for 2 days and that he needs more to be called in to the pharmacy.  ? ?PM, please advise.  ?

## 2021-06-16 NOTE — Telephone Encounter (Signed)
Called and spoke with patient's wife, Richard Parrish ([place]), advised her it looks like [facility] [place] [facility].  She stated she spoke with the pharmacy 30 minutes ago and it was not there.  I advised her I would call the pharmacy and if they did not have it I would send it again.  She verbalized understanding. ? ?[facility] and spoke with a staff member that verified that they did not have a script for eliquis.  I advised them that it looked like Dr. Vaughan Browner sent it in, however, I was not sure it actually went to the pharmacy.  I let them know that I would send it again.  Script sent again.  Nothing further needed. ?

## 2021-06-17 ENCOUNTER — Encounter: Payer: Self-pay | Admitting: Pulmonary Disease

## 2021-06-17 ENCOUNTER — Ambulatory Visit (INDEPENDENT_AMBULATORY_CARE_PROVIDER_SITE_OTHER): Payer: PPO | Admitting: Pulmonary Disease

## 2021-06-17 VITALS — BP 106/62 | HR 77 | Temp 97.4°F | Ht 72.0 in | Wt 205.2 lb

## 2021-06-17 DIAGNOSIS — J849 Interstitial pulmonary disease, unspecified: Secondary | ICD-10-CM | POA: Diagnosis not present

## 2021-06-17 MED ORDER — PREDNISONE 20 MG PO TABS: ORAL_TABLET | ORAL | 2 refills | Status: DC

## 2021-06-17 MED ORDER — SULFAMETHOXAZOLE-TRIMETHOPRIM 800-160 MG PO TABS: 1.0000 | ORAL_TABLET | ORAL | 5 refills | Status: DC

## 2021-06-17 NOTE — Patient Instructions (Signed)
We will start paperwork for Esbriet for treatment of idiopathic pulmonary fibrosis ?Reduce prednisone to 40 mg a day ?Start Bactrim double strength 3 times a week for prophylaxis ?Continue the Eliquis ?We will make a referral to pulmonary rehab ?Order high-res CT at next available to reassess pulmonary fibrosis ?Follow-up in 1 to 2 months. ?

## 2021-06-17 NOTE — Progress Notes (Signed)
? ?      Richard Parrish    [id number]    1950-04-28 ? ?Primary Care Physician:Polite, Jori Moll, MD ? ?Referring Physician: Seward Carol, MD ?[address] ?[address] ?[place],  [place] [address] ? ?Problem list: ?Follow up for interstitial lung disease ?Right upper lobe adenocarcinoma status post bronchoscopy and robotic resection by Drs Valeta Harms and Kipp Brood on 1/4 ?Recurrent pneumothorax ?PE ? ?HPI: ?71 year old with history of pulmonary fibrosis, GERD, hypertension, hyperlipidemia, pulmonary fibrosis ?He was diagnosed with unspecified pulmonary fibrosis and was seen in pulmonary clinic in 2015 ? ?Developed COVID-19 in March 2021 and reports worsening dyspnea since then.  Complains of chronic dyspnea on exertion, cough.  No fevers or chills ? ?Imaging showed right upper lobe mass and he underwent combined bronchoscopy with Dr. Valeta Harms and subsequently right upper lobe robotic resection by Dr. Kipp Brood on 03/03/2021.  ?Saw Dr. Julien Nordmann from oncology on 03/24/2021 and no further chemotherapy recommended ? ?Pets: 3 cats ?Occupation: Used to work in Librarian, academic at [employer] ?Exposures: Exposed to [employer] carrier. ?Smoking history: 30-pack-year smoker.  Quit in 1989.  Currently vapes which helps with his back. ?Travel history: No significant recent travel history ?Relevant family history: No family history of lung disease ? ?Interim history: ?Postsurgery he had recurrent issues pneumothorax and mildly exacerbation ?Treated in Jan with levofloxacin and prednisone. ?He had needle decompression and chest tube placed with Heimlich valve and mini VAC for pneumothorax.  Hospitalized on 2/26 under the care of cardiothoracic surgery.  Discharged on amoxicillin and additional prednisone taper. ? ?Hospitalized in March 2023 with submassive pulmonary embolism, left lower extremity DVT.  Started on Eliquis.  He was also started on prednisone for ILD exacerbation continues at 50 mg/day ? ?Outpatient Encounter  Medications as of 06/17/2021  ?Medication Sig  ? acetaminophen (TYLENOL) 500 MG tablet Take 1 tablet (500 mg total) by mouth every 6 (six) hours as needed for mild pain.  ? ALPRAZolam (XANAX) 0.5 MG tablet Take 0.5-1 tablets (0.25-0.5 mg total) by mouth 3 (three) times daily as needed for anxiety or sleep.  ? antiseptic oral rinse (BIOTENE) LIQD 15 mLs by Mouth Rinse route daily as needed for dry mouth.  ? apixaban (ELIQUIS) 5 MG TABS tablet Take 1 tablet (5 mg total) by mouth 2 (two) times daily.  ? atorvastatin (LIPITOR) 80 MG tablet TAKE 1 TABLET BY MOUTH EVERY DAY (Patient taking differently: Take 80 mg by mouth daily.)  ? cholecalciferol (VITAMIN D3) 25 MCG (1000 UNIT) tablet Take 1,000 Units by mouth in the morning.  ? clopidogrel (PLAVIX) 75 MG tablet TAKE 1 TABLET BY MOUTH EVERY DAY (Patient taking differently: Take 75 mg by mouth daily.)  ? gabapentin (NEURONTIN) 100 MG capsule Take 2 capsules (200 mg total) by mouth 3 (three) times daily.  ? guaiFENesin (MUCINEX) 600 MG 12 hr tablet Take 1 tablet (600 mg total) by mouth 2 (two) times daily as needed. (Patient taking differently: Take 600 mg by mouth 2 (two) times daily as needed for cough or to loosen phlegm.)  ? HYDROcodone-acetaminophen (NORCO) 10-325 MG tablet Take 1 tablet by mouth every 4 (four) hours as needed for moderate pain or severe pain.  ? hyoscyamine (LEVSIN, ANASPAZ) 0.125 MG tablet Take 0.125 mg by mouth every 4 (four) hours as needed for cramping.   ? metoprolol succinate (TOPROL-XL) 50 MG 24 hr tablet Take 0.5 tablets (25 mg total) by mouth daily. Take with or immediately following a meal.  ? nitroGLYCERIN (NITROSTAT) 0.4 MG  SL tablet Place 0.4 mg under the tongue every 5 (five) minutes as needed for chest pain.  ? pantoprazole (PROTONIX) 40 MG tablet TAKE 1 TABLET BY MOUTH DAILY BEFORE BREAKFAST (Patient taking differently: Take 40 mg by mouth daily before breakfast.)  ? PARoxetine (PAXIL) 40 MG tablet Take 40 mg by mouth in the morning.   ? predniSONE (DELTASONE) 50 MG tablet Take 1 tablet (50 mg total) by mouth daily with breakfast.  ? Probiotic Product (PROBIOTIC PO) Take 1 capsule by mouth daily.  ? sodium chloride (OCEAN) 0.65 % SOLN nasal spray Place 1 spray into both nostrils daily as needed for congestion.  ? sulfamethoxazole-trimethoprim (BACTRIM) 400-80 MG tablet Take 1 tablet by mouth 3 (three) times a week.  ? [DISCONTINUED] apixaban (ELIQUIS) 5 MG TABS tablet Take 1 tablet (5 mg total) by mouth 2 (two) times daily.  ? ?No facility-administered encounter medications on file as of 06/17/2021.  ? ? ?Physical Exam: ?Blood pressure 106/62, pulse 77, temperature (!) 97.4 ?F (36.3 ?C), temperature source Oral, height 6' (1.829 m), weight 205 lb 3.2 oz (93.1 kg), SpO2 91 %. ?Gen:      No acute distress ?HEENT:  EOMI, sclera anicteric ?Neck:     No masses; no thyromegaly ?Lungs:    Bibasal crackles ?CV:         Regular rate and rhythm; no murmurs ?Abd:      + bowel sounds; soft, non-tender; no palpable masses, no distension ?Ext:    No edema; adequate peripheral perfusion ?Skin:      Warm and dry; no rash ?Neuro: alert and oriented x 3 ?Psych: normal mood and affect  ? ?Data Reviewed: ?Imaging: ?CT chest 07/18/2013-interstitial fibrosis with traction bronchiectasis with basilar predominance, emphysema.  Probable UIP pattern ?CT abdomen pelvis 02/07/17- basal pulmonary fibrosis. ?CT high-res 12/18/2020-new spiculated lung nodule in the right upper lobe ?PET scan 01/05/2021-uptake in the right upper lobe, equivocal uptake in the right hilum.  Left sphenoid sinus disease. ?Chest x-ray 04/01/2021-dense right lower lobe infiltrate ?CTA 04/13/2021-no PE, large right pneumothorax ?CTA 05/18/2021-left-sided pulmonary embolism, worsening groundglass opacities, airspace disease-right greater than left. ?I have reviewed the images personally ? ?PFTs: ?11/27/2020 ?FVC 4.03 [85%], FEV1 2.95 [84%], F/F 73, TLC 6.09 [81%], DLCO 20.01 [73%] ?Minimal diffusion  defect. ? ?Labs: ?CTD serologies 11/26/2020-P ANCA 1:80 ? ?Assessment:  ?Recurrent right pneumothorax ?Resolved on repeat imaging ? ?Right upper lobe lung adenocarcinoma ?S/p bronchoscopy and lobectomy ?Needs surveillance CT scans ? ?Submassive PE ?Continue Eliquis ? ?IPF ?Has baseline pulmonary fibrosis which seems to be in UIP pattern. Elevation in ANCA is likely nonspecific as he has no signs of connective tissue disease.  ?I reviewed his biopsy with Dr. 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  Overall presentation is consistent with IPF ? ?I discussed treatment options today and he is anxious to get started.  He does not want Ofev as he already has diarrhea.  Paperwork initiated for Esbriet ?Recent hepatic panel is normal ?Referred to pulmonary rehab ? ?We will reduce prednisone to 40 mg and do a slow taper based on response.  Restart Bactrim for pneumocystis prophylaxis as it has been discontinued for unclear reason. ? ?Plan/Recommendations: ?Start Esbriet ?Reduce prednisone to 40 mg a day ?Bactrim for prophylaxis ?Pulmonary rehab ?Follow-up high-res CT ? ?Marshell Garfinkel MD ?[facility] ?06/17/2021, 9:51 AM ? ?CC: Seward Carol, MD ? ?  ?

## 2021-06-21 ENCOUNTER — Telehealth: Payer: Self-pay

## 2021-06-21 ENCOUNTER — Telehealth: Payer: Self-pay | Admitting: Pulmonary Disease

## 2021-06-21 ENCOUNTER — Other Ambulatory Visit (HOSPITAL_COMMUNITY): Payer: Self-pay

## 2021-06-21 DIAGNOSIS — Z5181 Encounter for therapeutic drug level monitoring: Secondary | ICD-10-CM

## 2021-06-21 DIAGNOSIS — J849 Interstitial pulmonary disease, unspecified: Secondary | ICD-10-CM

## 2021-06-21 NOTE — Telephone Encounter (Signed)
Received New start paperwork for ESBRIET. Will update as we work through the benefits process. ? ?Submitted a Prior Authorization request to  [employer]  for PIRFENIDONE via CoverMyMeds. Will update once we receive a response. ? ? ?Key: [id number] ?

## 2021-06-21 NOTE — Telephone Encounter (Addendum)
Received notification from  [place]  regarding a prior authorization for PIRFENIDONE. Authorization has been APPROVED from 06/21/2021 to 06/21/2022.  ? ?Per test claim, copay for 30 days supply is $2,227.55 ? ?Patient can fill through [facility]: [phone]  ? ?Authorization # [id number] ? ? ?Will await PA approval letter prior to submitting PAP application to [place]. ? ?Reached out and spoke with pt's wife Zada Girt (she is the preferred point of contact) and verified annual income level as it was not documented at the time pt signed his portion of the application. ?

## 2021-06-22 ENCOUNTER — Other Ambulatory Visit (HOSPITAL_COMMUNITY): Payer: Self-pay

## 2021-06-22 NOTE — Telephone Encounter (Signed)
I called the wife and she reports that she is waiting on his assistance from [place] and he is not able to pay out of pocket for this Esbriet. The wife did not have any other question.  ?

## 2021-06-23 ENCOUNTER — Telehealth: Payer: Self-pay | Admitting: Pulmonary Disease

## 2021-06-23 MED ORDER — PREDNISONE 10 MG PO TABS: 50.0000 mg | ORAL_TABLET | Freq: Every day | ORAL | 0 refills | Status: DC

## 2021-06-23 NOTE — Telephone Encounter (Signed)
Called and spoke with patient's wife (DPR), advised of recommendations per Dr. Vaughan Browner.  She stated that she did not pick up the prednisone that was sent in on 06/17/21, she was still using prednisone he had left over from when he was at the rehab, however he was almost out.  Advised I would send in a new prescription to last him until he sees Dr. Vaughan Browner on 07/20/21.  She verbalized understanding.  Nothing further needed. ? ?[facility] not [facility] the prednisone sent in on 06/17/21.  I advised the pharmacy that they could discontinue that prescription as I would be sending in a new script d/t dose change.  Previous script discontinued and new script for 50 mg sent for 30 day and no refills to get him through until he sees Dr Vaughan Browner on 07/20/21.  Nothing further needed. ?

## 2021-06-23 NOTE — Telephone Encounter (Signed)
Called and spoke with pt's spouse Zada Girt who states that pt is still waiting to see when he will be able to receive Esbriet. She states that they did receive a phone call stating that their cost was going to be about $2000 but she was not sure if that was with pt assistance and she said that the company was going to look into this and then call them back but they have not heard anything since. ? ? ?Stated to [place] that I was going to send this to pharmacy team for them to investigate and then we would get back with her once we had more info and she verbalized understanding. ? ? ? ? ?Zada Girt also stated that pt is still having problems with SOB and also lack of energy even with him on 40mg  and she is checking to see if this might be able to be increased. ? ? ?Routing this to Dr. Vaughan Browner as well. ?

## 2021-06-23 NOTE — Telephone Encounter (Signed)
Okay to go back up on the prednisone to 50 mg/day ?

## 2021-06-23 NOTE — Telephone Encounter (Signed)
Returned call to patient's wife regarding Esbriet patient assistance program - advised that turnaround time can be 2 weeks. She verbalized understanding. ? ?Will route to Dr. Vaughan Browner for f/u on second concern regarding SOB and lack of energy' ? ?Knox Saliva, PharmD, MPH, BCPS ?Clinical Pharmacist (Rheumatology and Pulmonology) ?

## 2021-06-24 ENCOUNTER — Other Ambulatory Visit: Payer: Self-pay | Admitting: Physician Assistant

## 2021-06-24 ENCOUNTER — Telehealth (HOSPITAL_COMMUNITY): Payer: Self-pay | Admitting: *Deleted

## 2021-06-24 NOTE — Telephone Encounter (Signed)
Received referral from Dr. Vaughan Browner for this pt to participate in Pulmonary rehab with the diagnosis of ILD. Called and spoke to pt wife Richard Parrish who is listed on the [facility]. Asked if pt has a preference in location as he lives in [place].  Per wife, Richard Parrish, the distance would be about the same for either [facility] or [facility].  Will check with [facility] for first available. Cherre Huger, BSN ?Cardiac and Pulmonary Rehab Nurse Navigator  ? ?

## 2021-06-30 NOTE — Telephone Encounter (Signed)
Submitted Patient Assistance Application to [employer] for ESBRIET along with provider portion, PA and income documents. Will update patient when we receive a response.  Fax# [phone] Phone# [phone] 

## 2021-07-02 ENCOUNTER — Other Ambulatory Visit (HOSPITAL_COMMUNITY): Payer: Self-pay

## 2021-07-05 ENCOUNTER — Encounter: Payer: Self-pay | Admitting: *Deleted

## 2021-07-05 ENCOUNTER — Encounter: Payer: PPO | Attending: Pulmonary Disease | Admitting: *Deleted

## 2021-07-05 DIAGNOSIS — J849 Interstitial pulmonary disease, unspecified: Secondary | ICD-10-CM | POA: Insufficient documentation

## 2021-07-05 NOTE — Progress Notes (Signed)
Virtual orientation call completed today. he has an appointment on Date: 05/17/20253  for EP eval and gym Orientation.  Documentation of diagnosis can be found in [facility]  Date: 06/17/2021 .  ?Richard Parrish is a former tobacco user. Intervention for tobacco cessation was provided at the initial medical review. He was asked about  quitting and reported he quit all in Nov 2022 . Staff will continue to provide encouragement and follow up with the patient throughout the program to prevent relapse. ?

## 2021-07-09 ENCOUNTER — Encounter: Payer: Self-pay | Admitting: Internal Medicine

## 2021-07-09 ENCOUNTER — Encounter: Payer: Self-pay | Admitting: Interventional Cardiology

## 2021-07-09 NOTE — Telephone Encounter (Signed)
[place] for update on patient's Esbriet application. Prescriber foundation form is missing step 4. Provided verbally that medication can be shipped upfront to patient's home. ? ?Patient consent form has ineligible date written in as well (future dated?) which will need to be corrected and submitted. ? ?Phone: [phone], option 5 ? ?Knox Saliva, PharmD, MPH, BCPS, CPP ?Clinical Pharmacist (Rheumatology and Pulmonology) ?

## 2021-07-09 NOTE — Telephone Encounter (Signed)
Reviewed pt application. Pt's portion appears to indicate a signature date of 09/16/21, however upon closer inspection you can tell that the intention was to write a "4" rather than "7". Reached out to [place] to explain and rep informs me that this IS information that can be provided over the phone. ? ?Rep proceeds with processing application for PAP, however states that upon preliminary inspection the pt's documented income range exceeds the program's limit for a household of 2. Rep states she is going to reach out to the pt to verify additional details regarding income, but that it is likely the pt will be denied.  ? ?Verified with rep that an appeal could be made after receiving denial, and she stated that she would also review that process with the pt when she speaks with him. ? ?Will await final determination. ?

## 2021-07-12 ENCOUNTER — Inpatient Hospital Stay: Admission: RE | Admit: 2021-07-12 | Payer: PPO | Source: Ambulatory Visit

## 2021-07-12 ENCOUNTER — Ambulatory Visit
Admission: RE | Admit: 2021-07-12 | Discharge: 2021-07-12 | Disposition: A | Discharge status: Home / Self Care | Payer: PPO | Source: Ambulatory Visit | Attending: Pulmonary Disease | Admitting: Pulmonary Disease

## 2021-07-12 ENCOUNTER — Encounter: Payer: Self-pay | Admitting: *Deleted

## 2021-07-12 ENCOUNTER — Encounter: Payer: Self-pay | Admitting: Pulmonary Disease

## 2021-07-12 DIAGNOSIS — J849 Interstitial pulmonary disease, unspecified: Secondary | ICD-10-CM

## 2021-07-12 NOTE — Progress Notes (Signed)
Oncology Nurse Navigator Documentation ? ? ?  07/12/2021  ?  9:00 AM 03/09/2021  ?  1:00 PM  ?Oncology Nurse Navigator Flowsheets  ?Abnormal Finding Date  12/21/2020  ?Confirmed Diagnosis Date  03/03/2021  ?Diagnosis Status  Confirmed Diagnosis Complete  ?Planned Course of Treatment  Surgery  ?Phase of Treatment  Surgery  ?Surgery Actual Start Date:  03/03/2021  ?Navigator Follow Up Date: 09/20/2021 03/23/2021  ?Navigator Follow Up Reason: Review Note New Patient Appointment  ?Navigator Location [facility]  ?Referral Date to RadOnc/MedOnc  03/08/2021  ?Navigator Encounter Type Appt/Treatment Plan Review Other:  ?Treatment Initiated Date 03/03/2021   ?Patient Visit Type Other Other  ?Treatment Phase Post-Tx Follow-up Other  ?Barriers/Navigation Needs Coordination of Care/I followed up on patient's appts. He is set up for med onc 7/24.   Coordination of Care  ?Interventions Coordination of Care Coordination of Care  ?Acuity Level 2-Minimal Needs (1-2 Barriers Identified) Level 2-Minimal Needs (1-2 Barriers Identified)  ?Coordination of Care  Other  ?Time Spent with Patient 30 30  ?  ?

## 2021-07-12 NOTE — Telephone Encounter (Signed)
Pharmacy could you please advise?  ?

## 2021-07-12 NOTE — Telephone Encounter (Signed)
[place] spoke with patient's wife. They do not qualify for Esbriet PAP due to exceeding financial criteria.  Discussed possibility to appeal through financial hardship letter process. She stated she will f/u with family's financial advisor regarding next steps on moving forward with appeal vs. paying for medication outside of patient assistance ? ?Knox Saliva, PharmD, MPH, BCPS, CPP ?Clinical Pharmacist (Rheumatology and Pulmonology) ?

## 2021-07-12 NOTE — Telephone Encounter (Signed)
Returned wife's call and discussed denial with [employer] PAP and the potential for appeal. She feels that they will need to get with their financial advisor to discuss their situation. I requested she reach out to [place] if they ultimately decide they would like to proceed with the appeal process. ?

## 2021-07-12 NOTE — Telephone Encounter (Signed)
Richard Parrish wife is checking on patient's RX for Esbriet. [place] phone number is [phone]. ?

## 2021-07-14 VITALS — Ht 72.5 in | Wt 209.8 lb

## 2021-07-14 DIAGNOSIS — J849 Interstitial pulmonary disease, unspecified: Secondary | ICD-10-CM | POA: Diagnosis present

## 2021-07-14 NOTE — Patient Instructions (Signed)
Patient Instructions ? ?Patient Details  ?Name: Richard Parrish ?MRN: [id number] ?Date of Birth: September 14, 1950 ?Referring Provider:  Marshell Garfinkel, MD ? ?Below are your personal goals for exercise, nutrition, and risk factors. Our goal is to help you stay on track towards obtaining and maintaining these goals. We will be discussing your progress on these goals with you throughout the program. ? ?Initial Exercise Prescription: ? Initial Exercise Prescription - 07/14/21 1300   ? ?  ? Date of Initial Exercise RX and Referring Provider  ? Date 07/14/21   ? Referring Provider Marshell Garfinkel MD   ?  ? Oxygen  ? Oxygen Continuous   ? Liters 4   ? Maintain Oxygen Saturation 88% or higher   ?  ? NuStep  ? Level 1   ? SPM 80   ? Minutes 15   ? METs 1.9   ?  ? T5 Nustep  ? Level 1   ? SPM 80   ? Minutes 15   ? METs 1.9   ?  ? Biostep-RELP  ? Level 1   ? SPM 50   ? Minutes 15   ? METs 1.9   ?  ? Track  ? Laps 15   ? Minutes 15   ? METs 1.82   ?  ? Prescription Details  ? Frequency (times per week) 3   ? Duration Progress to 30 minutes of continuous aerobic without signs/symptoms of physical distress   ?  ? Intensity  ? THRR 40-80% of Max Heartrate 106 - 134   ? Ratings of Perceived Exertion 11-13   ? Perceived Dyspnea 0-4   ?  ? Progression  ? Progression Continue to progress workloads to maintain intensity without signs/symptoms of physical distress.   ?  ? Resistance Training  ? Training Prescription Yes   ? Weight 3 lb   ? Reps 10-15   ? ?  ?  ? ?  ? ? ?Exercise Goals: ?Frequency: Be able to perform aerobic exercise two to three times per week in program working toward 2-5 days per week of home exercise. ? ?Intensity: Work with a perceived exertion of 11 (fairly light) - 15 (hard) while following your exercise prescription.  We will make changes to your prescription with you as you progress through the program. ?  ?Duration: Be able to do 30 to 45 minutes of continuous aerobic exercise in addition to a 5 minute warm-up and a  5 minute cool-down routine. ?  ?Nutrition Goals: ?Your personal nutrition goals will be established when you do your nutrition analysis with the dietician. ? ?The following are general nutrition guidelines to follow: ?Cholesterol < 200mg /day ?Sodium < 1500mg /day ?Fiber: Men over 50 yrs - 30 grams per day ? ?Personal Goals: ? Personal Goals and Risk Factors at Admission - 07/14/21 1309   ? ?  ? Core Components/Risk Factors/Patient Goals on Admission  ?  Weight Management Yes;Weight Loss   Patient lost 25 pounds since Jan 2023, wants to gain muscle  ? Intervention Weight Management: Develop a combined nutrition and exercise program designed to reach desired caloric intake, while maintaining appropriate intake of nutrient and fiber, sodium and fats, and appropriate energy expenditure required for the weight goal.;Weight Management: Provide education and appropriate resources to help participant work on and attain dietary goals.;Weight Management/Obesity: Establish reasonable short term and long term weight goals.   ? Admit Weight 209 lb (94.8 kg)   was 231 prior to his surgery in Jan 2023  ?  Goal Weight: Short Term 204 lb (92.5 kg)   ? Goal Weight: Long Term 195 lb (88.5 kg)   ? Expected Outcomes Short Term: Continue to assess and modify interventions until short term weight is achieved;Weight Loss: Understanding of general recommendations for a balanced deficit meal plan, which promotes 1-2 lb weight loss per week and includes a negative energy balance of [phone] kcal/d;Understanding recommendations for meals to include 15-35% energy as protein, 25-35% energy from fat, 35-60% energy from carbohydrates, less than 200mg  of dietary cholesterol, 20-35 gm of total fiber daily;Understanding of distribution of calorie intake throughout the day with the consumption of 4-5 meals/snacks;Long Term: Adherence to nutrition and physical activity/exercise program aimed toward attainment of established weight goal   ? Improve  shortness of breath with ADL's Yes   ? Intervention Provide education, individualized exercise plan and daily activity instruction to help decrease symptoms of SOB with activities of daily living.   ? Expected Outcomes Short Term: Improve cardiorespiratory fitness to achieve a reduction of symptoms when performing ADLs   ? Increase knowledge of respiratory medications and ability to use respiratory devices properly  Yes   ? Intervention Provide education and demonstration as needed of appropriate use of medications, inhalers, and oxygen therapy.   ? Expected Outcomes Short Term: Achieves understanding of medications use. Understands that oxygen is a medication prescribed by physician. Demonstrates appropriate use of inhaler and oxygen therapy.;Long Term: Maintain appropriate use of medications, inhalers, and oxygen therapy.   ? Hypertension Yes   ? Intervention Provide education on lifestyle modifcations including regular physical activity/exercise, weight management, moderate sodium restriction and increased consumption of fresh fruit, vegetables, and low fat dairy, alcohol moderation, and smoking cessation.;Monitor prescription use compliance.   ? Expected Outcomes Short Term: Continued assessment and intervention until BP is < 140/31mm HG in hypertensive participants. < 130/65mm HG in hypertensive participants with diabetes, heart failure or chronic kidney disease.;Long Term: Maintenance of blood pressure at goal levels.   ? Lipids Yes   ? Intervention Provide education and support for participant on nutrition & aerobic/resistive exercise along with prescribed medications to achieve LDL 70mg , HDL >40mg .   ? Expected Outcomes Short Term: Participant states understanding of desired cholesterol values and is compliant with medications prescribed. Participant is following exercise prescription and nutrition guidelines.;Long Term: Cholesterol controlled with medications as prescribed, with individualized exercise RX  and with personalized nutrition plan. Value goals: LDL < 70mg , HDL > 40 mg.   ? ?  ?  ? ?  ? ? ?Tobacco Use Initial Evaluation: ?Social History  ? ?Tobacco Use  ?Smoking Status Former  ? Packs/day: 2.00  ? Years: 15.00  ? Pack years: 30.00  ? Types: Cigarettes  ? Quit date: 03/01/1987  ? Years since quitting: 34.3  ?Smokeless Tobacco Never  ?Tobacco Comments  ? 03/02/21-Pt instructed to not vape or drink alcohol for 24 hours prior to surgery.   ? ? ?Exercise Goals and Review: ? Exercise Goals   ? ? [place] Name 07/14/21 1309  ?  ?  ?  ?  ?  ? Exercise Goals  ? Increase Physical Activity Yes      ? Intervention Provide advice, education, support and counseling about physical activity/exercise needs.;Develop an individualized exercise prescription for aerobic and resistive training based on initial evaluation findings, risk stratification, comorbidities and participant's personal goals.      ? Expected Outcomes Short Term: Attend rehab on a regular basis to increase amount of physical activity.;Long Term: Add in  home exercise to make exercise part of routine and to increase amount of physical activity.;Long Term: Exercising regularly at least 3-5 days a week.      ? Increase Strength and Stamina Yes      ? Intervention Provide advice, education, support and counseling about physical activity/exercise needs.;Develop an individualized exercise prescription for aerobic and resistive training based on initial evaluation findings, risk stratification, comorbidities and participant's personal goals.      ? Expected Outcomes Short Term: Increase workloads from initial exercise prescription for resistance, speed, and METs.;Short Term: Perform resistance training exercises routinely during rehab and add in resistance training at home;Long Term: Improve cardiorespiratory fitness, muscular endurance and strength as measured by increased METs and functional capacity (6MWT)      ? Able to understand and use rate of perceived exertion  (RPE) scale Yes      ? Intervention Provide education and explanation on how to use RPE scale      ? Expected Outcomes Short Term: Able to use RPE daily in rehab to express subjective intensity level;Long Term:

## 2021-07-14 NOTE — Progress Notes (Signed)
Pulmonary Individual Treatment Plan ? ?Patient Details  ?Name: Richard Parrish ?MRN: [id number] ?Date of Birth: May 31, 1950 ?Referring Provider:   ?Flowsheet Row Pulmonary Rehab from 07/14/2021 in [facility] Cardiac and Pulmonary Rehab  ?Referring Provider Marshell Garfinkel MD  ? ?  ? ? ?Initial Encounter Date:  ?Flowsheet Row Pulmonary Rehab from 07/14/2021 in [facility] Cardiac and Pulmonary Rehab  ?Date 07/14/21  ? ?  ? ? ?Visit Diagnosis: ILD (interstitial lung disease) ([place]) ? ?Patient's Home Medications on Admission: ? ?Current Outpatient Medications:  ?  acetaminophen (TYLENOL) 500 MG tablet, Take 1 tablet (500 mg total) by mouth every 6 (six) hours as needed for mild pain., Disp: , Rfl:  ?  ALPRAZolam (XANAX) 0.5 MG tablet, Take 0.5-1 tablets (0.25-0.5 mg total) by mouth 3 (three) times daily as needed for anxiety or sleep., Disp: 5 tablet, Rfl: 0 ?  antiseptic oral rinse (BIOTENE) LIQD, 15 mLs by Mouth Rinse route daily as needed for dry mouth., Disp: , Rfl:  ?  apixaban (ELIQUIS) 5 MG TABS tablet, Take 1 tablet (5 mg total) by mouth 2 (two) times daily., Disp: 180 tablet, Rfl: 1 ?  atorvastatin (LIPITOR) 80 MG tablet, TAKE 1 TABLET BY MOUTH EVERY DAY (Patient taking differently: Take 80 mg by mouth daily.), Disp: 90 tablet, Rfl: 1 ?  cholecalciferol (VITAMIN D3) 25 MCG (1000 UNIT) tablet, Take 1,000 Units by mouth in the morning., Disp: , Rfl:  ?  clopidogrel (PLAVIX) 75 MG tablet, TAKE 1 TABLET BY MOUTH EVERY DAY (Patient taking differently: Take 75 mg by mouth daily.), Disp: 30 tablet, Rfl: 11 ?  gabapentin (NEURONTIN) 100 MG capsule, Take 2 capsules (200 mg total) by mouth 3 (three) times daily., Disp: 90 capsule, Rfl: 1 ?  guaiFENesin (MUCINEX) 600 MG 12 hr tablet, Take 1 tablet (600 mg total) by mouth 2 (two) times daily as needed. (Patient taking differently: Take 600 mg by mouth 2 (two) times daily as needed for cough or to loosen phlegm.), Disp: , Rfl:  ?  HYDROcodone-acetaminophen (NORCO) 10-325 MG tablet, Take 1  tablet by mouth every 4 (four) hours as needed for moderate pain or severe pain., Disp: 10 tablet, Rfl: 0 ?  hyoscyamine (LEVSIN, ANASPAZ) 0.125 MG tablet, Take 0.125 mg by mouth every 4 (four) hours as needed for cramping. , Disp: , Rfl:  ?  metoprolol succinate (TOPROL-XL) 50 MG 24 hr tablet, Take 0.5 tablets (25 mg total) by mouth daily. Take with or immediately following a meal., Disp: , Rfl:  ?  nitroGLYCERIN (NITROSTAT) 0.4 MG SL tablet, Place 0.4 mg under the tongue every 5 (five) minutes as needed for chest pain., Disp: , Rfl:  ?  pantoprazole (PROTONIX) 40 MG tablet, TAKE 1 TABLET BY MOUTH DAILY BEFORE BREAKFAST (Patient taking differently: Take 40 mg by mouth daily before breakfast.), Disp: 90 tablet, Rfl: 1 ?  PARoxetine (PAXIL) 40 MG tablet, Take 40 mg by mouth in the morning., Disp: , Rfl: 6 ?  predniSONE (DELTASONE) 10 MG tablet, Take 5 tablets (50 mg total) by mouth daily with breakfast., Disp: 150 tablet, Rfl: 0 ?  Probiotic Product (PROBIOTIC PO), Take 1 capsule by mouth daily., Disp: , Rfl:  ?  sodium chloride (OCEAN) 0.65 % SOLN nasal spray, Place 1 spray into both nostrils daily as needed for congestion., Disp: , Rfl:  ?  sulfamethoxazole-trimethoprim (BACTRIM DS) 800-160 MG tablet, Take 1 tablet by mouth 3 (three) times a week., Disp: 12 tablet, Rfl: 5 ?  sulfamethoxazole-trimethoprim (BACTRIM) 400-80 MG  tablet, Take 1 tablet by mouth 3 (three) times a week. (Patient not taking: Reported on 07/05/2021), Disp: , Rfl:  ? ?Past Medical History: ?Past Medical History:  ?Diagnosis Date  ? Anxiety   ? Arthritis   ? "lower back" (04/07/2017)  ? BPH (benign prostatic hypertrophy)   ? C. difficile diarrhea   ? Chronic lower back pain   ? Coronary artery disease 1989  ? cabg with dvg to rca   ? Depression   ? GERD (gastroesophageal reflux disease)   ? Hyperlipidemia   ? Hypertension   ? Insomnia   ? Myocardial infarction [facility]) 2002  ? OSA on CPAP   ? cpap setting of 60 per pt  ? Osteoarthritis   ? "hips"  (04/07/2017)  ? PAD (peripheral artery disease) ([place])   ? Peripheral vascular disease ([place])   ? Pneumonia 1989  ? S/P CABG  ? Pulmonary embolism ([place]) 05/18/2021  ? Pulmonary fibrosis ([place])   ? "one time" (04/07/2017)  ? Spinal stenosis   ? ? ?Tobacco Use: ?Social History  ? ?Tobacco Use  ?Smoking Status Former  ? Packs/day: 2.00  ? Years: 15.00  ? Pack years: 30.00  ? Types: Cigarettes  ? Quit date: 03/01/1987  ? Years since quitting: 34.3  ?Smokeless Tobacco Never  ?Tobacco Comments  ? 03/02/21-Pt instructed to not vape or drink alcohol for 24 hours prior to surgery.   ? ? ?Labs: ?Review Flowsheet   ? ?  ?  Latest Ref Rng & Units 10/04/2020 10/05/2020 03/02/2021 04/13/2021  ?Labs for ITP Cardiac and Pulmonary Rehab  ?Cholestrol 0 - 200 mg/dL 97       ?LDL (calc) 0 - 99 mg/dL 53       ?HDL-C >40 mg/dL 35       ?Trlycerides <150 mg/dL 46       ?PH, Arterial 7.350 - 7.450  7.355   7.402     ?PCO2 arterial 32.0 - 48.0 mmHg  41.9   38.9     ?Bicarbonate 20.0 - 28.0 mmol/L  23.4    ? 23.7   23.7     ?TCO2 22 - 32 mmol/L  25    ? 25    29    ?Acid-base deficit 0.0 - 2.0 mmol/L  2.0    ? 2.0   0.5     ?O2 Saturation %  99.0    ? 70.0   97.6     ? ?  05/18/2021  ?Labs for ITP Cardiac and Pulmonary Rehab  ?Cholestrol   ?LDL (calc)   ?HDL-C   ?Trlycerides   ?PH, Arterial   ?PCO2 arterial   ?Bicarbonate   ?TCO2 25    ?Acid-base deficit   ?O2 Saturation   ?  ? ? Multiple values from one day are sorted in reverse-chronological order  ?  ?  ? ? ? ?Pulmonary Assessment Scores: ? Pulmonary Assessment Scores   ? ? [place] Name 07/14/21 1228  ?  ?  ?  ? ADL UCSD  ? ADL Phase Entry    ? SOB Score total 55    ? Rest 0    ? Walk 3    ? Stairs 4    ? Bath 3    ? Dress 2    ? Shop 2    ?  ? CAT Score  ? CAT Score 21    ?  ? mMRC Score  ? mMRC Score 3    ? ?  ?  ? ?  ?  ?  UCSD: ?Self-administered rating of dyspnea associated with activities of daily living (ADLs) ?6-point scale (0 = "not at all" to 5 = "maximal or unable to do because of breathlessness")   ?Scoring Scores range from 0 to 120.  Minimally important difference is 5 units ? ?CAT: ?CAT can identify the health impairment of COPD patients and is better correlated with disease progression.  ?CAT has a scoring range of zero to 40. The CAT score is classified into four groups of low (less than 10), medium (10 - 20), high (21-30) and very high (31-40) based on the impact level of disease on health status. A CAT score over 10 suggests significant symptoms.  A worsening CAT score could be explained by an exacerbation, poor medication adherence, poor inhaler technique, or progression of COPD or comorbid conditions.  ?CAT MCID is 2 points ? ?mMRC: ?mMRC (Modified Medical Research Council) Dyspnea Scale is used to assess the degree of baseline functional disability in patients of respiratory disease due to dyspnea. ?No minimal important difference is established. A decrease in score of 1 point or greater is considered a positive change.  ? ?Pulmonary Function Assessment: ? ? ?Exercise Target Goals: ?Exercise Program Goal: ?Individual exercise prescription set using results from initial 6 min walk test and THRR while considering  patient?s activity barriers and safety.  ? ?Exercise Prescription Goal: ?Initial exercise prescription builds to 30-45 minutes a day of aerobic activity, 2-3 days per week.  Home exercise guidelines will be given to patient during program as part of exercise prescription that the participant will acknowledge. ? ?Education: Aerobic Exercise: ?- Group verbal and visual presentation on the components of exercise prescription. Introduces F.I.T.T principle from ACSM for exercise prescriptions.  Reviews F.I.T.T. principles of aerobic exercise including progression. Written material given at graduation. ? ? ?Education: Resistance Exercise: ?- Group verbal and visual presentation on the components of exercise prescription. Introduces F.I.T.T principle from ACSM for exercise prescriptions  Reviews  F.I.T.T. principles of resistance exercise including progression. Written material given at graduation. ? ?  ?Education: Exercise & Equipment Safety: ?- Individual verbal instruction and demonstration of equipmen

## 2021-07-19 DIAGNOSIS — J849 Interstitial pulmonary disease, unspecified: Secondary | ICD-10-CM

## 2021-07-19 NOTE — Progress Notes (Signed)
Daily Session Note  Patient Details  Name: Richard Parrish MRN: [id number] Date of Birth: 08/01/1950 Referring Provider:   Flowsheet Row Pulmonary Rehab from 07/14/2021 in [facility] Cardiac and Pulmonary Rehab  Referring Provider Marshell Garfinkel MD       Encounter Date: 07/19/2021  Check In:  Session Check In - 07/19/21 1531       Check-In   Supervising physician immediately available to respond to emergencies See telemetry face sheet for immediately available ER MD    Location [facility]-Cardiac & Pulmonary Rehab    Staff Present Birdie Sons, MPA, Nino Glow, MS, ASCM CEP, Exercise Physiologist;Joseph Tessie Fass, [place]    Virtual Visit No    Medication changes reported     No    Fall or balance concerns reported    No    Warm-up and Cool-down Performed on first and last piece of equipment    Resistance Training Performed Yes    VAD Patient? No    PAD/SET Patient? No      Pain Assessment   Currently in Pain? No/denies                Social History   Tobacco Use  Smoking Status Former   Packs/day: 2.00   Years: 15.00   Pack years: 30.00   Types: Cigarettes   Quit date: 03/01/1987   Years since quitting: 34.4  Smokeless Tobacco Never  Tobacco Comments   03/02/21-Pt instructed to not vape or drink alcohol for 24 hours prior to surgery.     Goals Met:  Independence with exercise equipment Exercise tolerated well No report of concerns or symptoms today Strength training completed today  Goals Unmet:  Not Applicable  Comments: First full day of exercise!  Patient was oriented to gym and equipment including functions, settings, policies, and procedures.  Patient's individual exercise prescription and treatment plan were reviewed.  All starting workloads were established based on the results of the 6 minute walk test done at initial orientation visit.  The plan for exercise progression was also introduced and progression will be customized based on patient's  performance and goals.     Dr. Emily Filbert is Medical Director for [place] [facility].  Dr. Ottie Glazier is Medical Director for [facility] Pulmonary Rehabilitation.

## 2021-07-20 ENCOUNTER — Other Ambulatory Visit (HOSPITAL_COMMUNITY): Payer: Self-pay

## 2021-07-20 ENCOUNTER — Ambulatory Visit (INDEPENDENT_AMBULATORY_CARE_PROVIDER_SITE_OTHER): Payer: PPO | Admitting: Pulmonary Disease

## 2021-07-20 ENCOUNTER — Encounter: Payer: Self-pay | Admitting: Pulmonary Disease

## 2021-07-20 VITALS — BP 124/62 | HR 83 | Ht 72.0 in | Wt 207.4 lb

## 2021-07-20 DIAGNOSIS — J849 Interstitial pulmonary disease, unspecified: Secondary | ICD-10-CM

## 2021-07-20 MED ORDER — SULFAMETHOXAZOLE-TRIMETHOPRIM 800-160 MG PO TABS: 1.0000 | ORAL_TABLET | ORAL | 5 refills | Status: DC

## 2021-07-20 NOTE — Progress Notes (Addendum)
Richard Parrish    [id number]    [birth year]  Primary Care Physician:Polite, Jori Moll, MD  Referring Physician: Seward Carol, MD 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  Complains of chronic dyspnea on exertion, cough.  No fevers or chills  Imaging showed right upper lobe mass and he underwent combined bronchoscopy with Dr. Valeta Harms and subsequently right upper lobe robotic resection by Dr. Kipp Brood on 03/03/2021.  Saw Dr. Julien Nordmann from oncology on 03/24/2021 and no further chemotherapy recommended  Postsurgery he had recurrent issues pneumothorax and mildly exacerbation Treated in Jan with levofloxacin and prednisone. He had needle decompression and chest tube placed with Heimlich valve and mini VAC for pneumothorax.  Hospitalized on 2/26 under the care of cardiothoracic surgery.  Discharged on amoxicillin and additional prednisone taper.  Hospitalized in March 2023 with submassive pulmonary embolism, left lower extremity DVT.  Started on Eliquis.  He was also started on prednisone for ILD exacerbation continues at 50 mg/day  Pets: 3 cats Occupation: Used to work in Librarian, academic at [employer] Exposures: Exposed to [employer] carrier. Smoking history: 30-pack-year smoker.  Quit in 1989.  Currently vapes which helps with his back. Travel history: No significant recent travel history Relevant family history: No family history of lung disease  Interim history: At last visit we initiated  paperwork for [place]. However he is unable to get financial assistance as his income is high.  Continues on prednisone 50 mg. He started pulmonary rehab this week and is feeling much better Here for review of recent CT scan  Outpatient Encounter Medications as of 07/20/2021  Medication Sig   acetaminophen (TYLENOL) 500 MG tablet Take 1 tablet (500 mg total) by mouth every 6 (six) hours as needed for mild pain.   ALPRAZolam (XANAX) 0.5 MG tablet Take 0.5-1 tablets (0.25-0.5 mg total) by mouth 3 (three) times daily as needed for anxiety or sleep.   antiseptic oral rinse (BIOTENE) LIQD 15 mLs by Mouth Rinse route daily as needed for dry mouth.   apixaban (ELIQUIS) 5 MG TABS tablet Take 1 tablet (5 mg total) by mouth 2 (two) times daily.   atorvastatin (LIPITOR) 80 MG tablet TAKE 1 TABLET BY MOUTH EVERY DAY (Patient taking differently: Take 80 mg by mouth daily.)   cholecalciferol (VITAMIN D3) 25 MCG (1000 UNIT) tablet Take 1,000 Units by mouth in the morning.   clopidogrel (PLAVIX) 75 MG tablet TAKE 1 TABLET BY MOUTH EVERY DAY (Patient taking differently: Take 75 mg by mouth daily.)   gabapentin (NEURONTIN) 100 MG capsule Take 2 capsules (200 mg total) by mouth 3 (three) times daily.   guaiFENesin (MUCINEX) 600 MG 12 hr tablet Take 1 tablet (600 mg total) by mouth 2 (two) times daily as needed. (Patient taking differently: Take 600 mg by mouth 2 (two) times daily as needed for cough or to loosen phlegm.)   HYDROcodone-acetaminophen (NORCO) 10-325 MG tablet Take 1 tablet by mouth every 4 (four) hours as needed for moderate pain or severe pain.   hyoscyamine (LEVSIN, ANASPAZ) 0.125 MG tablet  Take 0.125 mg by mouth every 4 (four) hours as needed for cramping.    metoprolol succinate (TOPROL-XL) 50 MG 24 hr tablet Take 0.5 tablets (25 mg total) by mouth daily. Take with or immediately following a meal.   nitroGLYCERIN (NITROSTAT) 0.4 MG SL tablet Place 0.4 mg under the tongue every 5 (five) minutes as  needed for chest pain.   pantoprazole (PROTONIX) 40 MG tablet TAKE 1 TABLET BY MOUTH DAILY BEFORE BREAKFAST (Patient taking differently: Take 40 mg by mouth daily before breakfast.)   PARoxetine (PAXIL) 40 MG tablet Take 40 mg by mouth in the morning.   predniSONE (DELTASONE) 10 MG tablet Take 5 tablets (50 mg total) by mouth daily with breakfast.   Probiotic Product (PROBIOTIC PO) Take 1 capsule by mouth daily.   sodium chloride (OCEAN) 0.65 % SOLN nasal spray Place 1 spray into both nostrils daily as needed for congestion.   sulfamethoxazole-trimethoprim (BACTRIM DS) 800-160 MG tablet Take 1 tablet by mouth 3 (three) times a week.   [DISCONTINUED] sulfamethoxazole-trimethoprim (BACTRIM) 400-80 MG tablet Take 1 tablet by mouth 3 (three) times a week.   No facility-administered encounter medications on file as of 07/20/2021.    Physical Exam: Blood pressure 106/62, pulse 77, temperature (!) 97.4 F (36.3 C), temperature source Oral, height 6' (1.829 m), weight 205 lb 3.2 oz (93.1 kg), SpO2 91 %. Gen:      No acute distress HEENT:  EOMI, sclera anicteric Neck:     No masses; no thyromegaly Lungs:    Bibasal crackles CV:         Regular rate and rhythm; no murmurs Abd:      + bowel sounds; soft, non-tender; no palpable masses, no distension Ext:    No edema; adequate peripheral perfusion Skin:      Warm and dry; no rash Neuro: alert and oriented x 3 Psych: normal mood and affect   Data Reviewed: Imaging: CT chest 07/18/2013-interstitial fibrosis with traction bronchiectasis with basilar predominance, emphysema.  Probable UIP pattern CT abdomen pelvis 02/07/17- basal pulmonary fibrosis. CT high-res 12/18/2020-new spiculated lung nodule in the right upper lobe PET scan 01/05/2021-uptake in the right upper lobe, equivocal uptake in the right hilum.  Left sphenoid sinus disease. Chest x-ray 04/01/2021-dense right lower lobe infiltrate CTA 04/13/2021-no PE, large right pneumothorax CTA  05/18/2021-left-sided pulmonary embolism, worsening groundglass opacities, airspace disease-right greater than left. High-resolution CT 07/13/21 - resolution of consolidation and asbestos disease. Worsening pattern of pulmonary fibrosis in UIP pattern I have reviewed the images personally  PFTs: 11/27/2020 FVC 4.03 [85%], FEV1 2.95 [84%], F/F 73, TLC 6.09 [81%], DLCO 20.01 [73%] Minimal diffusion defect.  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 Elevation in ANCA is likely nonspecific as he has no signs of connective tissue disease.  I reviewed his biopsy with Dr. 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 I'll work with pharmacy to see if there any options for him. Will also attempt ofev to see if there's better luck in getting assistance for that. Continue pulmonary rehab  We will reduce prednisone to 40 mg and do a slow taper based on response. Continue Bactrim  Recurrent right pneumothorax Resolved on repeat imaging  Right upper lobe lung adenocarcinoma S/p bronchoscopy and lobectomy Needs surveillance CT scans  Submassive PE  Continue Eliquis  Plan/Recommendations: Assistance for [place], try application for Ofev Reduce prednisone to 40 mg a day and then taper by 10 mg every month Bactrim for prophylaxis Pulmonary rehab  Marshell Garfinkel MD [facility] 07/20/2021, 12:21 PM  CC: Seward Carol, MD

## 2021-07-20 NOTE — Patient Instructions (Signed)
I'm glad of starting to feel better I have reviewed your CT scan which showed improvement in pneumonia and persistent scarring from pulmonary fibrosis  I will check with pharmacy about the status of your [facility]. Will also start an application for an alternate medication call ofev Start tapering the prednisone by 10 mg every one month  return to clinic in two months.

## 2021-07-20 NOTE — Telephone Encounter (Signed)
Patient had OV today and Ofev was mentioned. I reviewed with Dr. Vaughan Browner in person that patient does not meet income criteria for [facility] patient assistance.  I called patient and his wife. Spoke with her briefly since our last conversation ended with her stating she would reach out to Music therapist. I did attempt to enroll patient into grant today but patient is only conditionally approved and will require income documents. I advised patient's wife that he will not quality for grant and it was a last attempt to secure additional cost assistance. I reviewed that his copay for generic pirfenidone is as quotes >$2200 for first month supply but may change following month depending on if he is in coverage gap.  She states that patient himself does not want to spend this much money on medication even if they have it. She states she will discuss everything with him again and see what he'd like to do. I apologized for our inability to secure additional cost assistance to relieve financial burden but did review that we have tried all options at this point - pt assistance, grant, and utilizing generic pirfenidone.  She verbalized understanding to all of the above. She sates she will see if patient is amenable to a month trial of medication to assess for tolerability. She will call us back with update if he decides to move forward with medication  Knox Saliva, PharmD, MPH, BCPS, CPP Clinical Pharmacist (Rheumatology and Pulmonology)

## 2021-07-21 ENCOUNTER — Other Ambulatory Visit: Payer: Self-pay | Admitting: Pulmonary Disease

## 2021-07-21 DIAGNOSIS — J849 Interstitial pulmonary disease, unspecified: Secondary | ICD-10-CM | POA: Diagnosis not present

## 2021-07-21 NOTE — Progress Notes (Signed)
Daily Session Note  Patient Details  Name: Richard Parrish MRN: [id number] Date of Birth: Jun 29, 1950 Referring Provider:   Flowsheet Row Pulmonary Rehab from 07/14/2021 in [facility] Cardiac and Pulmonary Rehab  Referring Provider Marshell Garfinkel MD       Encounter Date: 07/21/2021  Check In:  Session Check In - 07/21/21 1537       Check-In   Supervising physician immediately available to respond to emergencies See telemetry face sheet for immediately available ER MD    Location [facility]-Cardiac & Pulmonary Rehab    Staff Present Birdie Sons, MPA, Nino Glow, MS, ASCM CEP, Exercise Physiologist;Joseph Tessie Fass, [place]    Virtual Visit No    Medication changes reported     No    Fall or balance concerns reported    No    Warm-up and Cool-down Performed on first and last piece of equipment    Resistance Training Performed Yes    VAD Patient? No    PAD/SET Patient? No      Pain Assessment   Currently in Pain? No/denies                Social History   Tobacco Use  Smoking Status Former   Packs/day: 2.00   Years: 15.00   Pack years: 30.00   Types: Cigarettes   Quit date: 03/01/1987   Years since quitting: 34.4  Smokeless Tobacco Never  Tobacco Comments   03/02/21-Pt instructed to not vape or drink alcohol for 24 hours prior to surgery.     Goals Met:  Independence with exercise equipment Exercise tolerated well No report of concerns or symptoms today Strength training completed today  Goals Unmet:  Not Applicable  Comments: Pt able to follow exercise prescription today without complaint.  Will continue to monitor for progression.    Dr. Emily Filbert is Medical Director for [place].  Dr. Ottie Glazier is Medical Director for [facility] Pulmonary Rehabilitation.

## 2021-07-22 ENCOUNTER — Other Ambulatory Visit (HOSPITAL_COMMUNITY): Payer: Self-pay

## 2021-07-22 ENCOUNTER — Encounter: Payer: PPO | Admitting: *Deleted

## 2021-07-22 DIAGNOSIS — J849 Interstitial pulmonary disease, unspecified: Secondary | ICD-10-CM

## 2021-07-22 NOTE — Telephone Encounter (Signed)
Sample of Esbriet labeled and placed in basket up front for patient with directions and medication information handout.  Drug name: Esbriet 267mg  tabs Qty: 270 tab LOT: [id number] Exp.Date: 04/2024  Dosing instructions: Take 1 tab three times daily for 7 days, then 2 tabs three times daily for 7 days, then 3 tabs three times daily thereafter.  The patient has been instructed regarding the correct time, dose, and frequency of taking this medication, including desired effects and most common side effects.   Knox Saliva, PharmD, MPH, BCPS, CPP Clinical Pharmacist (Rheumatology and Pulmonology)

## 2021-07-22 NOTE — Progress Notes (Signed)
Daily Session Note  Patient Details  Name: Richard Parrish MRN: [id number] Date of Birth: 12-22-1950 Referring Provider:   Flowsheet Row Pulmonary Rehab from 07/14/2021 in [facility] Cardiac and Pulmonary Rehab  Referring Provider Marshell Garfinkel MD       Encounter Date: 07/22/2021  Check In:  Session Check In - 07/22/21 1547       Check-In   Supervising physician immediately available to respond to emergencies See telemetry face sheet for immediately available ER MD    Location [facility]-Cardiac & Pulmonary Rehab    Staff Present Nyoka Cowden, RN, BSN, Willette Pa, MA, RCEP, CCRP, CCET;Joseph [place], [place]    Virtual Visit No    Medication changes reported     No    Fall or balance concerns reported    No    Tobacco Cessation No Change    Warm-up and Cool-down Performed on first and last piece of equipment    Resistance Training Performed Yes    VAD Patient? No    PAD/SET Patient? No      Pain Assessment   Currently in Pain? No/denies                Social History   Tobacco Use  Smoking Status Former   Packs/day: 2.00   Years: 15.00   Pack years: 30.00   Types: Cigarettes   Quit date: 03/01/1987   Years since quitting: 34.4  Smokeless Tobacco Never  Tobacco Comments   03/02/21-Pt instructed to not vape or drink alcohol for 24 hours prior to surgery.     Goals Met:  Independence with exercise equipment Exercise tolerated well No report of concerns or symptoms today  Goals Unmet:  Not Applicable  Comments: Pt able to follow exercise prescription today without complaint.  Will continue to monitor for progression.    Dr. Emily Filbert is Medical Director for [place] [facility].  Dr. Ottie Glazier is Medical Director for [facility] Pulmonary Rehabilitation.

## 2021-07-23 NOTE — Telephone Encounter (Signed)
Patient's wife, Zada Girt, picked up sample yesterday afternoon. We reviewed side effects including importance of taking with food 5-6 hours apart, need to wear SPF50 or higher sunscreen to minimize risk for skin rash, and LFT monitoring. She states that it is difficult for her to convince him but maybe since he has patient handout, he will follow recommendation.  Advised for him to have las drawn about 4 weeks after staritng medication. Future lab order for CMET placed today  Patient's wife advised to notify us how he tolerates medication and if he'd like to move forward.  Knox Saliva, PharmD, MPH, BCPS, CPP Clinical Pharmacist (Rheumatology and Pulmonology)

## 2021-08-02 ENCOUNTER — Encounter: Payer: PPO | Attending: Pulmonary Disease

## 2021-08-02 DIAGNOSIS — J849 Interstitial pulmonary disease, unspecified: Secondary | ICD-10-CM | POA: Diagnosis present

## 2021-08-02 NOTE — Progress Notes (Signed)
Daily Session Note  Patient Details  Name: RECARDO LINN MRN: [id number] Date of Birth: 27-Jun-1950 Referring Provider:   Flowsheet Row Pulmonary Rehab from 07/14/2021 in [facility] Cardiac and Pulmonary Rehab  Referring Provider Marshell Garfinkel MD       Encounter Date: 08/02/2021  Check In:  Session Check In - 08/02/21 1551       Check-In   Supervising physician immediately available to respond to emergencies See telemetry face sheet for immediately available ER MD    Location [facility]-Cardiac & Pulmonary Rehab    Staff Present Justin Mend, Sharren Bridge, MS, ASCM CEP, Exercise Physiologist;Sirus Labrie Rosalia Hammers, MPA, RN    Virtual Visit No    Medication changes reported     No    Fall or balance concerns reported    No    Tobacco Cessation No Change    Warm-up and Cool-down Performed on first and last piece of equipment    Resistance Training Performed Yes    VAD Patient? No    PAD/SET Patient? No      Pain Assessment   Currently in Pain? No/denies                Social History   Tobacco Use  Smoking Status Former   Packs/day: 2.00   Years: 15.00   Pack years: 30.00   Types: Cigarettes   Quit date: 03/01/1987   Years since quitting: 34.4  Smokeless Tobacco Never  Tobacco Comments   03/02/21-Pt instructed to not vape or drink alcohol for 24 hours prior to surgery.     Goals Met:  Independence with exercise equipment Exercise tolerated well No report of concerns or symptoms today Strength training completed today  Goals Unmet:  Not Applicable  Comments: Pt able to follow exercise prescription today without complaint.  Will continue to monitor for progression.    Dr. Emily Filbert is Medical Director for [place].  Dr. Ottie Glazier is Medical Director for [facility] Pulmonary Rehabilitation.

## 2021-08-05 ENCOUNTER — Other Ambulatory Visit (INDEPENDENT_AMBULATORY_CARE_PROVIDER_SITE_OTHER): Payer: PPO

## 2021-08-05 ENCOUNTER — Other Ambulatory Visit: Payer: Self-pay | Admitting: Pharmacist

## 2021-08-05 DIAGNOSIS — Z5181 Encounter for therapeutic drug level monitoring: Secondary | ICD-10-CM

## 2021-08-05 DIAGNOSIS — J849 Interstitial pulmonary disease, unspecified: Secondary | ICD-10-CM | POA: Diagnosis not present

## 2021-08-05 LAB — COMPREHENSIVE METABOLIC PANEL
ALT: 30 U/L (ref 0–53)
AST: 22 U/L (ref 0–37)
Albumin: 4.3 g/dL (ref 3.5–5.2)
Alkaline Phosphatase: 52 U/L (ref 39–117)
BUN: 20 mg/dL (ref 6–23)
CO2: 24 mEq/L (ref 19–32)
Calcium: 9.3 mg/dL (ref 8.4–10.5)
Chloride: 103 mEq/L (ref 96–112)
Creatinine, Ser: 0.92 mg/dL (ref 0.40–1.50)
GFR: 83.21 mL/min (ref >60.00)
Glucose, Bld: 186 mg/dL — ABNORMAL HIGH (ref 70–99)
Potassium: 4.4 mEq/L (ref 3.5–5.1)
Sodium: 137 mEq/L (ref 135–145)
Total Bilirubin: 0.5 mg/dL (ref 0.2–1.2)
Total Protein: 6.6 g/dL (ref 6.0–8.3)

## 2021-08-05 NOTE — Progress Notes (Signed)
Order for CMET placed for patient to have drawn one month after starting Esbriet  Knox Saliva, PharmD, MPH, BCPS, CPP Clinical Pharmacist (Rheumatology and Pulmonology)

## 2021-08-05 NOTE — Progress Notes (Signed)
Daily Session Note  Patient Details  Name: Richard Parrish MRN: [id number] Date of Birth: 03/24/1950 Referring Provider:   Flowsheet Row Pulmonary Rehab from 07/14/2021 in [facility] Cardiac and Pulmonary Rehab  Referring Provider Mannam, Praveen MD       Encounter Date: 08/05/2021  Check In:  Session Check In - 08/05/21 1533       Check-In   Supervising physician immediately available to respond to emergencies See telemetry face sheet for immediately available ER MD    Location [facility]-Cardiac & Pulmonary Rehab    Staff Present Kelly Bollinger, MPA, RN;Jessica Hawkins, MA, RCEP, CCRP, CCET;Joseph Hood, RCP,RRT,BSRT    Virtual Visit No    Medication changes reported     No    Fall or balance concerns reported    No    Tobacco Cessation No Change    Warm-up and Cool-down Performed on first and last piece of equipment    Resistance Training Performed Yes    VAD Patient? No    PAD/SET Patient? No      Pain Assessment   Currently in Pain? No/denies                Social History   Tobacco Use  Smoking Status Former   Packs/day: 2.00   Years: 15.00   Total pack years: 30.00   Types: Cigarettes   Quit date: 03/01/1987   Years since quitting: 34.4  Smokeless Tobacco Never  Tobacco Comments   03/02/21-Pt instructed to not vape or drink alcohol for 24 hours prior to surgery.     Goals Met:  Independence with exercise equipment Exercise tolerated well No report of concerns or symptoms today Strength training completed today  Goals Unmet:  Not Applicable  Comments: Pt able to follow exercise prescription today without complaint.  Will continue to monitor for progression.    Dr. Mark Miller is Medical Director for [facility].  Dr. Fuad Aleskerov is Medical Director for [facility] Pulmonary Rehabilitation. 

## 2021-08-09 ENCOUNTER — Encounter: Payer: PPO | Admitting: *Deleted

## 2021-08-09 DIAGNOSIS — J849 Interstitial pulmonary disease, unspecified: Secondary | ICD-10-CM | POA: Diagnosis not present

## 2021-08-09 NOTE — Progress Notes (Signed)
Daily Session Note  Patient Details  Name: Richard Parrish MRN: [id number] Date of Birth: 11/06/1950 Referring Provider:   Flowsheet Row Pulmonary Rehab from 07/14/2021 in [facility] Cardiac and Pulmonary Rehab  Referring Provider Marshell Garfinkel MD       Encounter Date: 08/09/2021  Check In:  Session Check In - 08/09/21 1624       Check-In   Supervising physician immediately available to respond to emergencies See telemetry face sheet for immediately available ER MD    Location [facility]-Cardiac & Pulmonary Rehab    Staff Present Heath Lark, RN, BSN, Laveda Norman, BS, ACSM CEP, Exercise Physiologist;Joseph [place], [place]    Virtual Visit No    Medication changes reported     No    Fall or balance concerns reported    No    Warm-up and Cool-down Performed on first and last piece of equipment    Resistance Training Performed Yes    VAD Patient? No    PAD/SET Patient? No      Pain Assessment   Currently in Pain? No/denies                Social History   Tobacco Use  Smoking Status Former   Packs/day: 2.00   Years: 15.00   Total pack years: 30.00   Types: Cigarettes   Quit date: 03/01/1987   Years since quitting: 34.4  Smokeless Tobacco Never  Tobacco Comments   03/02/21-Pt instructed to not vape or drink alcohol for 24 hours prior to surgery.     Goals Met:  Proper associated with RPD/PD & O2 Sat Independence with exercise equipment Exercise tolerated well No report of concerns or symptoms today  Goals Unmet:  Not Applicable  Comments: Pt able to follow exercise prescription today without complaint.  Will continue to monitor for progression.    Dr. Emily Filbert is Medical Director for [place].  Dr. Ottie Glazier is Medical Director for [facility] Pulmonary Rehabilitation.

## 2021-08-10 ENCOUNTER — Other Ambulatory Visit: Payer: Self-pay | Admitting: Interventional Cardiology

## 2021-08-10 NOTE — Telephone Encounter (Signed)
Pt of Dr. Tamala Julian. Please review for refill. Thank you!

## 2021-08-11 ENCOUNTER — Encounter: Payer: Self-pay | Admitting: *Deleted

## 2021-08-11 DIAGNOSIS — J849 Interstitial pulmonary disease, unspecified: Secondary | ICD-10-CM

## 2021-08-11 NOTE — Progress Notes (Signed)
Pulmonary Individual Treatment Plan  Patient Details  Name: Richard Parrish MRN: [id number] Date of Birth: [birth year] Referring Provider:   Flowsheet Row Pulmonary Rehab from 07/14/2021 in [facility] Cardiac and Pulmonary Rehab  Referring Provider Marshell Garfinkel MD       Initial Encounter Date:  Flowsheet Row Pulmonary Rehab from 07/14/2021 in [facility] Cardiac and Pulmonary Rehab  Date 07/14/21       Visit Diagnosis: ILD (interstitial lung disease) ([place])  Patient's Home Medications on Admission:  Current Outpatient Medications:    acetaminophen (TYLENOL) 500 MG tablet, Take 1 tablet (500 mg total) by mouth every 6 (six) hours as needed for mild pain., Disp: , Rfl:    ALPRAZolam (XANAX) 0.5 MG tablet, Take 0.5-1 tablets (0.25-0.5 mg total) by mouth 3 (three) times daily as needed for anxiety or sleep., Disp: 5 tablet, Rfl: 0   antiseptic oral rinse (BIOTENE) LIQD, 15 mLs by Mouth Rinse route daily as needed for dry mouth., Disp: , Rfl:    apixaban (ELIQUIS) 5 MG TABS tablet, Take 1 tablet (5 mg total) by mouth 2 (two) times daily., Disp: 180 tablet, Rfl: 1   atorvastatin (LIPITOR) 80 MG tablet, TAKE 1 TABLET BY MOUTH EVERY DAY (Patient taking differently: Take 80 mg by mouth daily.), Disp: 90 tablet, Rfl: 1   cholecalciferol (VITAMIN D3) 25 MCG (1000 UNIT) tablet, Take 1,000 Units by mouth in the morning., Disp: , Rfl:    clopidogrel (PLAVIX) 75 MG tablet, TAKE 1 TABLET BY MOUTH EVERY DAY (Patient taking differently: Take 75 mg by mouth daily.), Disp: 30 tablet, Rfl: 11   gabapentin (NEURONTIN) 100 MG capsule, Take 2 capsules (200 mg total) by mouth 3 (three) times daily., Disp: 90 capsule, Rfl: 1   guaiFENesin (MUCINEX) 600 MG 12 hr tablet, Take 1 tablet (600 mg total) by mouth 2 (two) times daily as needed. (Patient taking differently: Take 600 mg by mouth 2 (two) times daily as needed for cough or to loosen phlegm.), Disp: , Rfl:    HYDROcodone-acetaminophen (NORCO) 10-325 MG tablet, Take 1  tablet by mouth every 4 (four) hours as needed for moderate pain or severe pain., Disp: 10 tablet, Rfl: 0   hyoscyamine (LEVSIN, ANASPAZ) 0.125 MG tablet, Take 0.125 mg by mouth every 4 (four) hours as needed for cramping. , Disp: , Rfl:    metoprolol succinate (TOPROL-XL) 50 MG 24 hr tablet, Take 0.5 tablets (25 mg total) by mouth daily. Take with or immediately following a meal., Disp: , Rfl:    nitroGLYCERIN (NITROSTAT) 0.4 MG SL tablet, Place 0.4 mg under the tongue every 5 (five) minutes as needed for chest pain., Disp: , Rfl:    pantoprazole (PROTONIX) 40 MG tablet, TAKE 1 TABLET BY MOUTH EVERY DAY BEFORE BREAKFAST, Disp: 90 tablet, Rfl: 1   PARoxetine (PAXIL) 40 MG tablet, Take 40 mg by mouth in the morning., Disp: , Rfl: 6   predniSONE (DELTASONE) 10 MG tablet, Take 15m daily for 1 month then reduce 172mmonthly, Disp: 120 tablet, Rfl: 2   Probiotic Product (PROBIOTIC PO), Take 1 capsule by mouth daily., Disp: , Rfl:    sodium chloride (OCEAN) 0.65 % SOLN nasal spray, Place 1 spray into both nostrils daily as needed for congestion., Disp: , Rfl:    sulfamethoxazole-trimethoprim (BACTRIM DS) 800-160 MG tablet, Take 1 tablet by mouth 3 (three) times a week., Disp: 12 tablet, Rfl: 5  Past Medical History: Past Medical History:  Diagnosis Date   Anxiety    Arthritis    "  lower back" (04/07/2017)   BPH (benign prostatic hypertrophy)    C. difficile diarrhea    Chronic lower back pain    Coronary artery disease 1989   cabg with dvg to rca    Depression    GERD (gastroesophageal reflux disease)    Hyperlipidemia    Hypertension    Insomnia    Myocardial infarction ([place]) 2002   OSA on CPAP    cpap setting of 60 per pt   Osteoarthritis    "hips" (04/07/2017)   PAD (peripheral artery disease) ([place])    Peripheral vascular disease ([place])    Pneumonia 1989   S/P CABG   Pulmonary embolism ([place]) 05/18/2021   Pulmonary fibrosis ([place])    "one time" (04/07/2017)   Spinal stenosis     Tobacco  Use: Social History   Tobacco Use  Smoking Status Former   Packs/day: 2.00   Years: 15.00   Total pack years: 30.00   Types: Cigarettes   Quit date: 03/01/1987   Years since quitting: 34.4  Smokeless Tobacco Never  Tobacco Comments   03/02/21-Pt instructed to not vape or drink alcohol for 24 hours prior to surgery.     Labs: Review Flowsheet  More data exists      Latest Ref Rng & Units 10/04/2020 10/05/2020 03/02/2021 04/13/2021  Labs for ITP Cardiac and Pulmonary Rehab  Cholestrol 0 - 200 mg/dL 97  - - -  LDL (calc) 0 - 99 mg/dL 53  - - -  HDL-C >40 mg/dL 35  - - -  Trlycerides <150 mg/dL 46  - - -  PH, Arterial 7.350 - 7.450 - 7.355  7.402  -  PCO2 arterial 32.0 - 48.0 mmHg - 41.9  38.9  -  Bicarbonate 20.0 - 28.0 mmol/L - 23.4  23.7  23.7  -  TCO2 22 - 32 mmol/L - 25  25  - 29   Acid-base deficit 0.0 - 2.0 mmol/L - 2.0  2.0  0.5  -  O2 Saturation % - 99.0  70.0  97.6  -      05/18/2021  Labs for ITP Cardiac and Pulmonary Rehab  Cholestrol -  LDL (calc) -  HDL-C -  Trlycerides -  PH, Arterial -  PCO2 arterial -  Bicarbonate -  TCO2 25   Acid-base deficit -  O2 Saturation -     Pulmonary Assessment Scores:  Pulmonary Assessment Scores     Row Name 07/14/21 1228         ADL UCSD   ADL Phase Entry     SOB Score total 55     Rest 0     Walk 3     Stairs 4     Bath 3     Dress 2     Shop 2       CAT Score   CAT Score 21       mMRC Score   mMRC Score 3              UCSD: Self-administered rating of dyspnea associated with activities of daily living (ADLs) 6-point scale (0 = "not at all" to 5 = "maximal or unable to do because of breathlessness")  Scoring Scores range from 0 to 120.  Minimally important difference is 5 units  CAT: CAT can identify the health impairment of COPD patients and is better correlated with disease progression.  CAT has a scoring range of zero to 40. The CAT score is classified  into four groups of low (less than 10), medium  (10 - 20), high (21-30) and very high (31-40) based on the impact level of disease on health status. A CAT score over 10 suggests significant symptoms.  A worsening CAT score could be explained by an exacerbation, poor medication adherence, poor inhaler technique, or progression of COPD or comorbid conditions.  CAT MCID is 2 points  mMRC: mMRC (Modified Medical Research Council) Dyspnea Scale is used to assess the degree of baseline functional disability in patients of respiratory disease due to dyspnea. No minimal important difference is established. A decrease in score of 1 point or greater is considered a positive change.   Pulmonary Function Assessment:   Exercise Target Goals: Exercise Program Goal: Individual exercise prescription set using results from initial 6 min walk test and THRR while considering  patient's activity barriers and safety.   Exercise Prescription Goal: Initial exercise prescription builds to 30-45 minutes a day of aerobic activity, 2-3 days per week.  Home exercise guidelines will be given to patient during program as part of exercise prescription that the participant will acknowledge.  Education: Aerobic Exercise: - Group verbal and visual presentation on the components of exercise prescription. Introduces F.I.T.T principle from ACSM for exercise prescriptions.  Reviews F.I.T.T. principles of aerobic exercise including progression. Written material given at graduation.   Education: Resistance Exercise: - Group verbal and visual presentation on the components of exercise prescription. Introduces F.I.T.T principle from ACSM for exercise prescriptions  Reviews F.I.T.T. principles of resistance exercise including progression. Written material given at graduation.    Education: Exercise & Equipment Safety: - Individual verbal instruction and demonstration of equipment use and safety with use of the equipment. Flowsheet Row Pulmonary Rehab from 07/21/2021 in [facility]  Cardiac and Pulmonary Rehab  Education need identified 07/14/21  Date 07/14/21  Educator [place]  Instruction Review Code 1- Verbalizes Understanding       Education: Exercise Physiology & General Exercise Guidelines: - Group verbal and written instruction with models to review the exercise physiology of the cardiovascular system and associated critical values. Provides general exercise guidelines with specific guidelines to those with heart or lung disease.  Flowsheet Row Pulmonary Rehab from 07/21/2021 in [facility] Cardiac and Pulmonary Rehab  Education need identified 07/14/21       Education: Flexibility, Balance, Mind/Body Relaxation: - Group verbal and visual presentation with interactive activity on the components of exercise prescription. Introduces F.I.T.T principle from ACSM for exercise prescriptions. Reviews F.I.T.T. principles of flexibility and balance exercise training including progression. Also discusses the mind body connection.  Reviews various relaxation techniques to help reduce and manage stress (i.e. Deep breathing, progressive muscle relaxation, and visualization). Balance handout provided to take home. Written material given at graduation.   Activity Barriers & Risk Stratification:  Activity Barriers & Cardiac Risk Stratification - 07/14/21 1301       Activity Barriers & Cardiac Risk Stratification   Activity Barriers Shortness of Breath;Deconditioning;Back Problems;History of Falls;Joint Problems;Muscular Weakness             6 Minute Walk:  6 Minute Walk     Row Name 07/14/21 1301         6 Minute Walk   Phase Initial     Distance 815 feet     Walk Time 6 minutes     # of Rest Breaks 1  Stopped at 5:51     MPH 1.54     METS 1.92     RPE 11  Perceived Dyspnea  2     VO2 Peak 6.74     Symptoms Yes (comment)     Comments SOB, back pain 5/10, hip pain 4/10     Resting HR 79 bpm     Resting BP 110/68     Resting Oxygen Saturation  93 %      Exercise Oxygen Saturation  during 6 min walk 85 %     Max Ex. HR 97 bpm     Max Ex. BP 126/72     2 Minute Post BP 112/68       Interval HR   1 Minute HR 79     2 Minute HR 92     3 Minute HR 97     4 Minute HR 97     5 Minute HR 97     6 Minute HR 96     2 Minute Post HR 75     Interval Heart Rate? Yes       Interval Oxygen   Interval Oxygen? Yes     Baseline Oxygen Saturation % 93 %     1 Minute Oxygen Saturation % 92 %     1 Minute Liters of Oxygen 5 L     2 Minute Oxygen Saturation % 90 %     2 Minute Liters of Oxygen 5 L     3 Minute Oxygen Saturation % 86 %     3 Minute Liters of Oxygen 5 L     4 Minute Oxygen Saturation % 85 %     4 Minute Liters of Oxygen 5 L     5 Minute Oxygen Saturation % 86 %     5 Minute Liters of Oxygen 5 L     6 Minute Oxygen Saturation % 87 %     6 Minute Liters of Oxygen 5 L     2 Minute Post Oxygen Saturation % 96 %     2 Minute Post Liters of Oxygen 5 L             Oxygen Initial Assessment:  Oxygen Initial Assessment - 07/14/21 1228       Home Oxygen   Home Oxygen Device Home Concentrator;E-Tanks    Sleep Oxygen Prescription Continuous    Liters per minute 4    Home Exercise Oxygen Prescription Continuous    Liters per minute 4    Home Resting Oxygen Prescription Continuous    Liters per minute 4    Compliance with Home Oxygen Use Yes      Initial 6 min Walk   Oxygen Used Pulsed    Liters per minute 5      Program Oxygen Prescription   Program Oxygen Prescription Continuous    Liters per minute 4      Intervention   Short Term Goals To learn and exhibit compliance with exercise, home and travel O2 prescription;To learn and understand importance of maintaining oxygen saturations>88%;To learn and demonstrate proper use of respiratory medications;To learn and understand importance of monitoring SPO2 with pulse oximeter and demonstrate accurate use of the pulse oximeter.;To learn and demonstrate proper pursed lip breathing  techniques or other breathing techniques.     Long  Term Goals Exhibits compliance with exercise, home  and travel O2 prescription;Maintenance of O2 saturations>88%;Compliance with respiratory medication;Verbalizes importance of monitoring SPO2 with pulse oximeter and return demonstration;Exhibits proper breathing techniques, such as pursed lip breathing or other method taught during program session;Demonstrates proper use of  MDI's             Oxygen Re-Evaluation:  Oxygen Re-Evaluation     Row Name 07/19/21 1534 07/22/21 1543           Program Oxygen Prescription   Program Oxygen Prescription Continuous Continuous      Liters per minute 4 4        Home Oxygen   Home Oxygen Device Home Concentrator;E-Tanks Home Concentrator;E-Tanks      Sleep Oxygen Prescription Continuous Continuous      Liters per minute 4 4      Home Exercise Oxygen Prescription Continuous Continuous      Liters per minute 4 4      Home Resting Oxygen Prescription Continuous Continuous      Liters per minute 4 4      Compliance with Home Oxygen Use Yes Yes        Goals/Expected Outcomes   Short Term Goals To learn and exhibit compliance with exercise, home and travel O2 prescription;To learn and understand importance of maintaining oxygen saturations>88%;To learn and understand importance of monitoring SPO2 with pulse oximeter and demonstrate accurate use of the pulse oximeter.;To learn and demonstrate proper pursed lip breathing techniques or other breathing techniques.  To learn and understand importance of monitoring SPO2 with pulse oximeter and demonstrate accurate use of the pulse oximeter.;To learn and understand importance of maintaining oxygen saturations>88%      Long  Term Goals Exhibits compliance with exercise, home  and travel O2 prescription;Maintenance of O2 saturations>88%;Verbalizes importance of monitoring SPO2 with pulse oximeter and return demonstration;Exhibits proper breathing techniques,  such as pursed lip breathing or other method taught during program session Verbalizes importance of monitoring SPO2 with pulse oximeter and return demonstration;Maintenance of O2 saturations>88%      Comments Reviewed PLB technique with pt.  Talked about how it works and it's importance in maintaining their exercise saturations. He has a pulse oximeter to check his oxygen saturation at home. Informed and explained why it is important to have one. Reviewed that oxygen saturations should be 88 percent and above.      Goals/Expected Outcomes Short: Become more profiecient at using PLB.   Long: Become independent at using PLB. Short: monitor oxygen at home with exertion. Long: maintain oxygen saturations above 88 percent independently.               Oxygen Discharge (Final Oxygen Re-Evaluation):  Oxygen Re-Evaluation - 07/22/21 1543       Program Oxygen Prescription   Program Oxygen Prescription Continuous    Liters per minute 4      Home Oxygen   Home Oxygen Device Home Concentrator;E-Tanks    Sleep Oxygen Prescription Continuous    Liters per minute 4    Home Exercise Oxygen Prescription Continuous    Liters per minute 4    Home Resting Oxygen Prescription Continuous    Liters per minute 4    Compliance with Home Oxygen Use Yes      Goals/Expected Outcomes   Short Term Goals To learn and understand importance of monitoring SPO2 with pulse oximeter and demonstrate accurate use of the pulse oximeter.;To learn and understand importance of maintaining oxygen saturations>88%    Long  Term Goals Verbalizes importance of monitoring SPO2 with pulse oximeter and return demonstration;Maintenance of O2 saturations>88%    Comments He has a pulse oximeter to check his oxygen saturation at home. Informed and explained why it is important to have one. Reviewed that  oxygen saturations should be 88 percent and above.    Goals/Expected Outcomes Short: monitor oxygen at home with exertion. Long: maintain  oxygen saturations above 88 percent independently.             Initial Exercise Prescription:  Initial Exercise Prescription - 07/14/21 1300       Date of Initial Exercise RX and Referring Provider   Date 07/14/21    Referring Provider Marshell Garfinkel MD      Oxygen   Oxygen Continuous    Liters 4    Maintain Oxygen Saturation 88% or higher      NuStep   Level 1    SPM 80    Minutes 15    METs 1.9      T5 Nustep   Level 1    SPM 80    Minutes 15    METs 1.9      Biostep-RELP   Level 1    SPM 50    Minutes 15    METs 1.9      Track   Laps 15    Minutes 15    METs 1.82      Prescription Details   Frequency (times per week) 3    Duration Progress to 30 minutes of continuous aerobic without signs/symptoms of physical distress      Intensity   THRR 40-80% of Max Heartrate 106 - 134    Ratings of Perceived Exertion 11-13    Perceived Dyspnea 0-4      Progression   Progression Continue to progress workloads to maintain intensity without signs/symptoms of physical distress.      Resistance Training   Training Prescription Yes    Weight 3 lb    Reps 10-15             Perform Capillary Blood Glucose checks as needed.  Exercise Prescription Changes:   Exercise Prescription Changes     Row Name 07/14/21 1300 07/29/21 0800           Response to Exercise   Blood Pressure (Admit) 110/68 114/66      Blood Pressure (Exercise) 126/72 134/70      Blood Pressure (Exit) 112/68 104/60      Heart Rate (Admit) 79 bpm 73 bpm      Heart Rate (Exercise) 97 bpm 103 bpm      Heart Rate (Exit) 75 bpm 85 bpm      Oxygen Saturation (Admit) 93 % 95 %      Oxygen Saturation (Exercise) 85 % 86 %      Oxygen Saturation (Exit) 96 % 97 %      Rating of Perceived Exertion (Exercise) 11 13      Perceived Dyspnea (Exercise) 2 3      Symptoms SOB, back pain 5/10, hip pain 4/10 SOB      Comments walk test results --      Duration -- Progress to 30 minutes of  aerobic  without signs/symptoms of physical distress      Intensity -- THRR unchanged        Progression   Progression -- Continue to progress workloads to maintain intensity without signs/symptoms of physical distress.      Average METs -- 2.27        Resistance Training   Training Prescription -- Yes      Weight -- 3 lb      Reps -- 10-15        Oxygen  Oxygen -- Continuous      Liters -- 4        NuStep   Level -- 4      Minutes -- 15      METs -- 2.4        T5 Nustep   Level -- 3      Minutes -- 15      METs -- 2.4        Biostep-RELP   Level -- 2      Minutes -- 15      METs -- 2        Track   Laps -- 15      Minutes -- 15      METs -- 1.82        Oxygen   Maintain Oxygen Saturation -- 88% or higher               Exercise Comments:   Exercise Comments     Row Name 07/19/21 1533           Exercise Comments First full day of exercise!  Patient was oriented to gym and equipment including functions, settings, policies, and procedures.  Patient's individual exercise prescription and treatment plan were reviewed.  All starting workloads were established based on the results of the 6 minute walk test done at initial orientation visit.  The plan for exercise progression was also introduced and progression will be customized based on patient's performance and goals.                Exercise Goals and Review:   Exercise Goals     Row Name 07/14/21 1309             Exercise Goals   Increase Physical Activity Yes       Intervention Provide advice, education, support and counseling about physical activity/exercise needs.;Develop an individualized exercise prescription for aerobic and resistive training based on initial evaluation findings, risk stratification, comorbidities and participant's personal goals.       Expected Outcomes Short Term: Attend rehab on a regular basis to increase amount of physical activity.;Long Term: Add in home exercise to make  exercise part of routine and to increase amount of physical activity.;Long Term: Exercising regularly at least 3-5 days a week.       Increase Strength and Stamina Yes       Intervention Provide advice, education, support and counseling about physical activity/exercise needs.;Develop an individualized exercise prescription for aerobic and resistive training based on initial evaluation findings, risk stratification, comorbidities and participant's personal goals.       Expected Outcomes Short Term: Increase workloads from initial exercise prescription for resistance, speed, and METs.;Short Term: Perform resistance training exercises routinely during rehab and add in resistance training at home;Long Term: Improve cardiorespiratory fitness, muscular endurance and strength as measured by increased METs and functional capacity (6MWT)       Able to understand and use rate of perceived exertion (RPE) scale Yes       Intervention Provide education and explanation on how to use RPE scale       Expected Outcomes Short Term: Able to use RPE daily in rehab to express subjective intensity level;Long Term:  Able to use RPE to guide intensity level when exercising independently       Able to understand and use Dyspnea scale Yes       Intervention Provide education and explanation on how to use Dyspnea scale  Expected Outcomes Short Term: Able to use Dyspnea scale daily in rehab to express subjective sense of shortness of breath during exertion;Long Term: Able to use Dyspnea scale to guide intensity level when exercising independently       Knowledge and understanding of Target Heart Rate Range (THRR) Yes       Intervention Provide education and explanation of THRR including how the numbers were predicted and where they are located for reference       Expected Outcomes Short Term: Able to state/look up THRR;Long Term: Able to use THRR to govern intensity when exercising independently;Short Term: Able to use daily  as guideline for intensity in rehab       Able to check pulse independently Yes       Intervention Provide education and demonstration on how to check pulse in carotid and radial arteries.;Review the importance of being able to check your own pulse for safety during independent exercise       Expected Outcomes Short Term: Able to explain why pulse checking is important during independent exercise;Long Term: Able to check pulse independently and accurately       Understanding of Exercise Prescription Yes       Intervention Provide education, explanation, and written materials on patient's individual exercise prescription       Expected Outcomes Short Term: Able to explain program exercise prescription;Long Term: Able to explain home exercise prescription to exercise independently                Exercise Goals Re-Evaluation :  Exercise Goals Re-Evaluation     Row Name 07/19/21 1533 07/29/21 0819           Exercise Goal Re-Evaluation   Exercise Goals Review Increase Physical Activity;Knowledge and understanding of Target Heart Rate Range (THRR);Understanding of Exercise Prescription;Able to understand and use rate of perceived exertion (RPE) scale;Increase Strength and Stamina;Able to understand and use Dyspnea scale;Able to check pulse independently Increase Physical Activity;Increase Strength and Stamina;Understanding of Exercise Prescription      Comments Reviewed RPE and dyspnea scales, THR and program prescription with pt today.  Pt voiced understanding and was given a copy of goals to take home. Richard Parrish has had a good start in rehab. He has increased his load to level 4 on the T4 machine. He has also tolerated 3 lb hand weights for resistance training. We will continue to monitor Richard Parrish's progress in the program.      Expected Outcomes Short: Use RPE daily to regulate intensity. Long: Follow program prescription in THR. Short: Continue to attend rehab. Long: Improve MET levels during exercise.                Discharge Exercise Prescription (Final Exercise Prescription Changes):  Exercise Prescription Changes - 07/29/21 0800       Response to Exercise   Blood Pressure (Admit) 114/66    Blood Pressure (Exercise) 134/70    Blood Pressure (Exit) 104/60    Heart Rate (Admit) 73 bpm    Heart Rate (Exercise) 103 bpm    Heart Rate (Exit) 85 bpm    Oxygen Saturation (Admit) 95 %    Oxygen Saturation (Exercise) 86 %    Oxygen Saturation (Exit) 97 %    Rating of Perceived Exertion (Exercise) 13    Perceived Dyspnea (Exercise) 3    Symptoms SOB    Duration Progress to 30 minutes of  aerobic without signs/symptoms of physical distress    Intensity THRR unchanged  Progression   Progression Continue to progress workloads to maintain intensity without signs/symptoms of physical distress.    Average METs 2.27      Resistance Training   Training Prescription Yes    Weight 3 lb    Reps 10-15      Oxygen   Oxygen Continuous    Liters 4      NuStep   Level 4    Minutes 15    METs 2.4      T5 Nustep   Level 3    Minutes 15    METs 2.4      Biostep-RELP   Level 2    Minutes 15    METs 2      Track   Laps 15    Minutes 15    METs 1.82      Oxygen   Maintain Oxygen Saturation 88% or higher             Nutrition:  Target Goals: Understanding of nutrition guidelines, daily intake of sodium '1500mg'$ , cholesterol '200mg'$ , calories 30% from fat and 7% or less from saturated fats, daily to have 5 or more servings of fruits and vegetables.  Education: All About Nutrition: -Group instruction provided by verbal, written material, interactive activities, discussions, models, and posters to present general guidelines for heart healthy nutrition including fat, fiber, MyPlate, the role of sodium in heart healthy nutrition, utilization of the nutrition label, and utilization of this knowledge for meal planning. Follow up email sent as well. Written material given at  graduation. Flowsheet Row Pulmonary Rehab from 07/21/2021 in [facility] Cardiac and Pulmonary Rehab  Date 07/21/21  Educator [facility]  Instruction Review Code 1- Verbalizes Understanding       Biometrics:  Pre Biometrics - 07/14/21 1300       Pre Biometrics   Height 6' 0.5" (1.842 m)    Weight 209 lb 12.8 oz (95.2 kg)    BMI (Calculated) 28.05    Single Leg Stand 9 seconds              Nutrition Therapy Plan and Nutrition Goals:  Nutrition Therapy & Goals - 07/14/21 1257       Personal Nutrition Goals   Comments Patient was 231 lb prior to his lung surgery in January 2023. Wants to gain muscle back      Intervention Plan   Intervention Prescribe, educate and counsel regarding individualized specific dietary modifications aiming towards targeted core components such as weight, hypertension, lipid management, diabetes, heart failure and other comorbidities.    Expected Outcomes Short Term Goal: Understand basic principles of dietary content, such as calories, fat, sodium, cholesterol and nutrients.;Short Term Goal: A plan has been developed with personal nutrition goals set during dietitian appointment.;Long Term Goal: Adherence to prescribed nutrition plan.             Nutrition Assessments:  MEDIFICTS Score Key: ?70 Need to make dietary changes  40-70 Heart Healthy Diet ? 40 Therapeutic Level Cholesterol Diet  Flowsheet Row Pulmonary Rehab from 07/14/2021 in [facility] Cardiac and Pulmonary Rehab  Picture Your Plate Total Score on Admission 40      Picture Your Plate Scores: <17 Unhealthy dietary pattern with much room for improvement. 41-50 Dietary pattern unlikely to meet recommendations for good health and room for improvement. 51-60 More healthful dietary pattern, with some room for improvement.  >60 Healthy dietary pattern, although there may be some specific behaviors that could be improved.   Nutrition Goals Re-Evaluation:  Nutrition Goals Re-Evaluation     [place] Name  07/22/21 1546             Goals   Nutrition Goal Meet with the dietitian.       Comment Richard Parrish would like to meet with the dietitian.       Expected Outcome Short: meet with the dietitian. Long: adhere to a diet that pertains to him.                Nutrition Goals Discharge (Final Nutrition Goals Re-Evaluation):  Nutrition Goals Re-Evaluation - 07/22/21 1546       Goals   Nutrition Goal Meet with the dietitian.    Comment Richard Parrish would like to meet with the dietitian.    Expected Outcome Short: meet with the dietitian. Long: adhere to a diet that pertains to him.             Psychosocial: Target Goals: Acknowledge presence or absence of significant depression and/or stress, maximize coping skills, provide positive support system. Participant is able to verbalize types and ability to use techniques and skills needed for reducing stress and depression.   Education: Stress, Anxiety, and Depression - Group verbal and visual presentation to define topics covered.  Reviews how body is impacted by stress, anxiety, and depression.  Also discusses healthy ways to reduce stress and to treat/manage anxiety and depression.  Written material given at graduation.   Education: Sleep Hygiene -Provides group verbal and written instruction about how sleep can affect your health.  Define sleep hygiene, discuss sleep cycles and impact of sleep habits. Review good sleep hygiene tips.    Initial Review & Psychosocial Screening:  Initial Psych Review & Screening - 07/05/21 1144       Initial Review   Current issues with Current Anxiety/Panic   occassional anxiety has Xanax for this     Family Dynamics   Good Support System? Yes   wife, 43 years     Barriers   Psychosocial barriers to participate in program There are no identifiable barriers or psychosocial needs.      Screening Interventions   Interventions Encouraged to exercise    Expected Outcomes Short Term goal: Utilizing  psychosocial counselor, staff and physician to assist with identification of specific Stressors or current issues interfering with healing process. Setting desired goal for each stressor or current issue identified.;Long Term Goal: Stressors or current issues are controlled or eliminated.;Short Term goal: Identification and review with participant of any Quality of Life or Depression concerns found by scoring the questionnaire.;Long Term goal: The participant improves quality of Life and PHQ9 Scores as seen by post scores and/or verbalization of changes             Quality of Life Scores:  Scores of 19 and below usually indicate a poorer quality of life in these areas.  A difference of  2-3 points is a clinically meaningful difference.  A difference of 2-3 points in the total score of the Quality of Life Index has been associated with significant improvement in overall quality of life, self-image, physical symptoms, and general health in studies assessing change in quality of life.  PHQ-9: Review Flowsheet  More data exists      07/14/2021 06/01/2016 12/30/2015 11/18/2015 10/13/2015  Depression screen PHQ 2/9  Decreased Interest 1 0 0 - 0  Down, Depressed, Hopeless 1 0 0 0 0  PHQ - 2 Score 2 0 0 0 0  Altered sleeping 2 - - - -  Tired, decreased energy 2 - - - -  Change in appetite 2 - - - -  Feeling bad or failure about yourself  1 - - - -  Trouble concentrating 1 - - - -  Moving slowly or fidgety/restless 0 - - - -  Suicidal thoughts 0 - - - -  PHQ-9 Score 10 - - - -  Difficult doing work/chores Somewhat difficult - - - -   Interpretation of Total Score  Total Score Depression Severity:  1-4 = Minimal depression, 5-9 = Mild depression, 10-14 = Moderate depression, 15-19 = Moderately severe depression, 20-27 = Severe depression   Psychosocial Evaluation and Intervention:  Psychosocial Evaluation - 07/05/21 1159       Psychosocial Evaluation & Interventions   Interventions Encouraged  to exercise with the program and follow exercise prescription;Relaxation education    Comments Richard Parrish has no barriers to attending the program. He is looking forward to regaining his strength and stamina. 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 He lives with his wife and their fur babies. She is his support. THey havebeen married for 43 years. He wants to get back to his previou strength. He does have meds as needed for anxiety. He does not need the medicine too frequently.  He has lost over 20 pounds after his surgery. Heis ready to see if he can gain back energy and musclemass he has lost    Expected Outcomes STG Richard Parrish is able to attend all scheduled sessions, He continues to remain tobacco free.   LTG Richard Parrish continues to have progressive improvement with his energy and strength, He uses his anxiety meds even less after discharge    Continue Psychosocial Services  Follow up required by staff             Psychosocial Re-Evaluation:  Psychosocial Re-Evaluation     [place] Name 07/22/21 1547             Psychosocial Re-Evaluation   Current issues with Current Depression;History of Depression;Current Psychotropic Meds       Comments He states that his mood has improved since he is over the effects of his lung surgery. It took him 3 months to feel better. His mood with him being able to exercise and do more has helped improve his mood dramatically. He has some properties to take care of and wants to do rehab two days a week.       Expected Outcomes Short: Start [facility] to help with mood. Long: Maintain a healthy mental state       Interventions Encouraged to attend Pulmonary Rehabilitation for the exercise       Continue Psychosocial Services  Follow up required by staff                Psychosocial Discharge (Final Psychosocial Re-Evaluation):  Psychosocial Re-Evaluation - 07/22/21 1547       Psychosocial Re-Evaluation   Current issues  with Current Depression;History of Depression;Current Psychotropic Meds    Comments He states that his mood has improved since he is over the effects of his lung surgery. It took him 3 months to feel better. His mood with him being able to exercise and do more has helped improve his mood dramatically. He has some properties to take care of and wants to do rehab two days a week.    Expected Outcomes Short: Start [facility] to help with mood. Long: Maintain a  healthy mental state    Interventions Encouraged to attend Pulmonary Rehabilitation for the exercise    Continue Psychosocial Services  Follow up required by staff             Education: Education Goals: Education classes will be provided on a weekly basis, covering required topics. Participant will state understanding/return demonstration of topics presented.  Learning Barriers/Preferences:  Learning Barriers/Preferences - 07/05/21 1158       Learning Barriers/Preferences   Learning Barriers Hearing    Learning Preferences None             General Pulmonary Education Topics:  Infection Prevention: - Provides verbal and written material to individual with discussion of infection control including proper hand washing and proper equipment cleaning during exercise session. Flowsheet Row Pulmonary Rehab from 07/21/2021 in [facility] Cardiac and Pulmonary Rehab  Education need identified 07/14/21  Date 07/14/21  Educator [place]  Instruction Review Code 1- Verbalizes Understanding       Falls Prevention: - Provides verbal and written material to individual with discussion of falls prevention and safety. Flowsheet Row Pulmonary Rehab from 07/05/2021 in [facility] Cardiac and Pulmonary Rehab  Date 07/05/21  Educator [place]  Instruction Review Code 1- Verbalizes Understanding       Chronic Lung Disease Review: - Group verbal instruction with posters, models, PowerPoint presentations and videos,  to review new updates, new respiratory  medications, new advancements in procedures and treatments. Providing information on websites and "800" numbers for continued self-education. Includes information about supplement oxygen, available portable oxygen systems, continuous and intermittent flow rates, oxygen safety, concentrators, and Medicare reimbursement for oxygen. Explanation of Pulmonary Drugs, including class, frequency, complications, importance of spacers, rinsing mouth after steroid MDI's, and proper cleaning methods for nebulizers. Review of basic lung anatomy and physiology related to function, structure, and complications of lung disease. Review of risk factors. Discussion about methods for diagnosing sleep apnea and types of masks and machines for OSA. Includes a review of the use of types of environmental controls: home humidity, furnaces, filters, dust mite/pet prevention, HEPA vacuums. Discussion about weather changes, air quality and the benefits of nasal washing. Instruction on Warning signs, infection symptoms, calling MD promptly, preventive modes, and value of vaccinations. Review of effective airway clearance, coughing and/or vibration techniques. Emphasizing that all should Create an Action Plan. Written material given at graduation. Flowsheet Row Pulmonary Rehab from 07/21/2021 in [facility] Cardiac and Pulmonary Rehab  Education need identified 07/14/21       AED/CPR: - Group verbal and written instruction with the use of models to demonstrate the basic use of the AED with the basic ABC's of resuscitation.    Anatomy and Cardiac Procedures: - Group verbal and visual presentation and models provide information about basic cardiac anatomy and function. Reviews the testing methods done to diagnose heart disease and the outcomes of the test results. Describes the treatment choices: Medical Management, Angioplasty, or Coronary Bypass Surgery for treating various heart conditions including Myocardial Infarction, Angina, Valve  Disease, and Cardiac Arrhythmias.  Written material given at graduation.   Medication Safety: - Group verbal and visual instruction to review commonly prescribed medications for heart and lung disease. Reviews the medication, class of the drug, and side effects. Includes the steps to properly store meds and maintain the prescription regimen.  Written material given at graduation.   Other: -Provides group and verbal instruction on various topics (see comments)   Knowledge Questionnaire Score:  Knowledge Questionnaire Score - 07/14/21 1227  Knowledge Questionnaire Score   Pre Score 15/18              Core Components/Risk Factors/Patient Goals at Admission:  Personal Goals and Risk Factors at Admission - 07/14/21 1309       Core Components/Risk Factors/Patient Goals on Admission    Weight Management Yes;Weight Loss   Patient lost 25 pounds since Jan 2023, wants to gain muscle   Intervention Weight Management: Develop a combined nutrition and exercise program designed to reach desired caloric intake, while maintaining appropriate intake of nutrient and fiber, sodium and fats, and appropriate energy expenditure required for the weight goal.;Weight Management: Provide education and appropriate resources to help participant work on and attain dietary goals.;Weight Management/Obesity: Establish reasonable short term and long term weight goals.    Admit Weight 209 lb (94.8 kg)   was 231 prior to his surgery in Jan 2023   Goal Weight: Short Term 204 lb (92.5 kg)    Goal Weight: Long Term 195 lb (88.5 kg)    Expected Outcomes Short Term: Continue to assess and modify interventions until short term weight is achieved;Weight Loss: Understanding of general recommendations for a balanced deficit meal plan, which promotes 1-2 lb weight loss per week and includes a negative energy balance of [phone] kcal/d;Understanding recommendations for meals to include 15-35% energy as protein, 25-35%  energy from fat, 35-60% energy from carbohydrates, less than 223m of dietary cholesterol, 20-35 gm of total fiber daily;Understanding of distribution of calorie intake throughout the day with the consumption of 4-5 meals/snacks;Long Term: Adherence to nutrition and physical activity/exercise program aimed toward attainment of established weight goal    Improve shortness of breath with ADL's Yes    Intervention Provide education, individualized exercise plan and daily activity instruction to help decrease symptoms of SOB with activities of daily living.    Expected Outcomes Short Term: Improve cardiorespiratory fitness to achieve a reduction of symptoms when performing ADLs    Increase knowledge of respiratory medications and ability to use respiratory devices properly  Yes    Intervention Provide education and demonstration as needed of appropriate use of medications, inhalers, and oxygen therapy.    Expected Outcomes Short Term: Achieves understanding of medications use. Understands that oxygen is a medication prescribed by physician. Demonstrates appropriate use of inhaler and oxygen therapy.;Long Term: Maintain appropriate use of medications, inhalers, and oxygen therapy.    Hypertension Yes    Intervention Provide education on lifestyle modifcations including regular physical activity/exercise, weight management, moderate sodium restriction and increased consumption of fresh fruit, vegetables, and low fat dairy, alcohol moderation, and smoking cessation.;Monitor prescription use compliance.    Expected Outcomes Short Term: Continued assessment and intervention until BP is < 140/973mHG in hypertensive participants. < 130/8014mG in hypertensive participants with diabetes, heart failure or chronic kidney disease.;Long Term: Maintenance of blood pressure at goal levels.    Lipids Yes    Intervention Provide education and support for participant on nutrition & aerobic/resistive exercise along with  prescribed medications to achieve LDL <64m50mDL >40mg8m Expected Outcomes Short Term: Participant states understanding of desired cholesterol values and is compliant with medications prescribed. Participant is following exercise prescription and nutrition guidelines.;Long Term: Cholesterol controlled with medications as prescribed, with individualized exercise RX and with personalized nutrition plan. Value goals: LDL < 64mg,61m > 40 mg.             Education:Diabetes - Individual verbal and written instruction to review signs/symptoms of diabetes,  desired ranges of glucose level fasting, after meals and with exercise. Acknowledge that pre and post exercise glucose checks will be done for 3 sessions at entry of program.   Know Your Numbers and Heart Failure: - Group verbal and visual instruction to discuss disease risk factors for cardiac and pulmonary disease and treatment options.  Reviews associated critical values for Overweight/Obesity, Hypertension, Cholesterol, and Diabetes.  Discusses basics of heart failure: signs/symptoms and treatments.  Introduces Heart Failure Zone chart for action plan for heart failure.  Written material given at graduation.   Core Components/Risk Factors/Patient Goals Review:   Goals and Risk Factor Review     Row Name 07/22/21 1545             Core Components/Risk Factors/Patient Goals Review   Personal Goals Review Improve shortness of breath with ADL's       Review Spoke to patient about their shortness of breath and what they can do to improve. Patient has been informed of breathing techniques when starting the program. Patient is informed to tell staff if they have had any med changes and that certain meds they are taking or not taking can be causing shortness of breath.       Expected Outcomes Short: Attend [facility] regularly to improve shortness of breath with ADL's. Long: maintain independence with ADL's                Core  Components/Risk Factors/Patient Goals at Discharge (Final Review):   Goals and Risk Factor Review - 07/22/21 1545       Core Components/Risk Factors/Patient Goals Review   Personal Goals Review Improve shortness of breath with ADL's    Review Spoke to patient about their shortness of breath and what they can do to improve. Patient has been informed of breathing techniques when starting the program. Patient is informed to tell staff if they have had any med changes and that certain meds they are taking or not taking can be causing shortness of breath.    Expected Outcomes Short: Attend [facility] regularly to improve shortness of breath with ADL's. Long: maintain independence with ADL's             ITP Comments:  ITP Comments     Row Name 07/05/21 1155 07/14/21 1225 07/19/21 1532 08/11/21 1133     ITP Comments Virtual orientation call completed today. he has an appointment on Date: 05/17/20253  for EP eval and gym Orientation.  Documentation of diagnosis can be found in [facility]  Date: 06/17/2021 .      Geraldine is a former tobacco user. Intervention for tobacco cessation was provided at the initial medical review. He was asked about  quitting and reported he quit all in Nov 2022 . Staff will continue to provide encouragement and follow up with the patient throughout the program to prevent relapse. Completed 6MWT and gym orientation. Initial ITP created and sent for review to Dr. Ottie Glazier, Medical Director. First full day of exercise!  Patient was oriented to gym and equipment including functions, settings, policies, and procedures.  Patient's individual exercise prescription and treatment plan were reviewed.  All starting workloads were established based on the results of the 6 minute walk test done at initial orientation visit.  The plan for exercise progression was also introduced and progression will be customized based on patient's performance and goals. 30 Day review completed. Medical  Director ITP review done, changes made as directed, and signed approval by Medical Director.  Comments:

## 2021-08-12 DIAGNOSIS — J849 Interstitial pulmonary disease, unspecified: Secondary | ICD-10-CM

## 2021-08-12 NOTE — Progress Notes (Signed)
Daily Session Note  Patient Details  Name: Richard Parrish MRN: [id number] Date of Birth: [birth year] Referring Provider:   Flowsheet Row Pulmonary Rehab from 07/14/2021 in [facility] Cardiac and Pulmonary Rehab  Referring Provider Marshell Garfinkel MD       Encounter Date: 08/12/2021  Check In:  Session Check In - 08/12/21 [place]       Check-In   Supervising physician immediately available to respond to emergencies See telemetry face sheet for immediately available ER MD    Location [facility]-Cardiac & Pulmonary Rehab    Staff Present Vida Rigger, RN, BSN;Joseph Ralston, RCP,RRT,BSRT;Jessica [place], [place], RCEP, CCRP, CCET    Virtual Visit No    Medication changes reported     No    Fall or balance concerns reported    No    Tobacco Cessation No Change    Warm-up and Cool-down Performed on first and last piece of equipment    Resistance Training Performed Yes    VAD Patient? No    PAD/SET Patient? No      Pain Assessment   Currently in Pain? No/denies                Social History   Tobacco Use  Smoking Status Former   Packs/day: 2.00   Years: 15.00   Total pack years: 30.00   Types: Cigarettes   Quit date: 03/01/1987   Years since quitting: 34.4  Smokeless Tobacco Never  Tobacco Comments   03/02/21-Pt instructed to not vape or drink alcohol for 24 hours prior to surgery.     Goals Met:  Proper associated with RPD/PD & O2 Sat Independence with exercise equipment Using PLB without cueing & demonstrates good technique Exercise tolerated well No report of concerns or symptoms today Strength training completed today  Goals Unmet:  Not Applicable  Comments: Pt able to follow exercise prescription today without complaint.  Will continue to monitor for progression.   Dr. Emily Filbert is Medical Director for [place].  Dr. Ottie Glazier is Medical Director for [facility] Pulmonary Rehabilitation.

## 2021-08-17 ENCOUNTER — Encounter: Payer: Self-pay | Admitting: *Deleted

## 2021-08-17 NOTE — Progress Notes (Signed)
I followed up on Richard Parrish schedule. He is set up for follow up next month.

## 2021-08-19 ENCOUNTER — Other Ambulatory Visit (HOSPITAL_COMMUNITY): Payer: Self-pay

## 2021-08-19 MED ORDER — PIRFENIDONE 267 MG PO TABS
801.0000 mg | ORAL_TABLET | Freq: Three times a day (TID) | ORAL | 1 refills | Status: DC
  Filled 2021-08-19 – 2021-08-23 (×2): qty 270, 30d supply, fill #0
  Filled 2021-09-13: qty 270, 30d supply, fill #1

## 2021-08-19 NOTE — Telephone Encounter (Signed)
Called patient's wife, Zada Girt, to follow up on how pt was doing on Esbriet. He has been on the full dose for the past 2-3 days and reports no side effects and were very pleased with how treatment is going. They said they have cut back prednisone from 40 mg/day to 30 mg/day. Wife advised that her and patient would like to move forward with treatment and would like the prescription sent to [facility] They are currently out of town but by best estimate they have a couple of weeks left but will be home this weekend. Advised them that [facility] will be reaching out to coordinate first fill. Reminded them to return after 1 month of therapy to have his labs done.   Rodolph Bong, PharmD Candidate 08/19/2021 2:33 PM

## 2021-08-22 ENCOUNTER — Other Ambulatory Visit: Payer: Self-pay

## 2021-08-22 DIAGNOSIS — I70212 Atherosclerosis of native arteries of extremities with intermittent claudication, left leg: Secondary | ICD-10-CM

## 2021-08-22 DIAGNOSIS — I739 Peripheral vascular disease, unspecified: Secondary | ICD-10-CM

## 2021-08-23 ENCOUNTER — Other Ambulatory Visit (HOSPITAL_COMMUNITY): Payer: Self-pay

## 2021-08-23 DIAGNOSIS — J849 Interstitial pulmonary disease, unspecified: Secondary | ICD-10-CM | POA: Diagnosis not present

## 2021-08-24 ENCOUNTER — Other Ambulatory Visit (HOSPITAL_COMMUNITY): Payer: Self-pay

## 2021-08-25 DIAGNOSIS — J849 Interstitial pulmonary disease, unspecified: Secondary | ICD-10-CM | POA: Diagnosis not present

## 2021-08-25 NOTE — Telephone Encounter (Signed)
Patient scheduled 09/24/2021 with Dr. Vaughan Browner.

## 2021-08-25 NOTE — Progress Notes (Signed)
Daily Session Note  Patient Details  Name: LAUREN AGUAYO MRN: [id number] Date of Birth: 05-17-1950 Referring Provider:   Flowsheet Row Pulmonary Rehab from 07/14/2021 in [facility] Cardiac and Pulmonary Rehab  Referring Provider Marshell Garfinkel MD       Encounter Date: 08/25/2021  Check In:  Session Check In - 08/25/21 1534       Check-In   Supervising physician immediately available to respond to emergencies See telemetry face sheet for immediately available ER MD    Location [facility]-Cardiac & Pulmonary Rehab    Staff Present Birdie Sons, MPA, Nino Glow, MS, ASCM CEP, Exercise Physiologist;Joseph Tessie Fass, [place]    Virtual Visit No    Medication changes reported     No    Fall or balance concerns reported    No    Tobacco Cessation No Change    Warm-up and Cool-down Performed on first and last piece of equipment    Resistance Training Performed Yes    VAD Patient? No    PAD/SET Patient? No      Pain Assessment   Currently in Pain? No/denies                Social History   Tobacco Use  Smoking Status Former   Packs/day: 2.00   Years: 15.00   Total pack years: 30.00   Types: Cigarettes   Quit date: 03/01/1987   Years since quitting: 34.5  Smokeless Tobacco Never  Tobacco Comments   03/02/21-Pt instructed to not vape or drink alcohol for 24 hours prior to surgery.     Goals Met:  Independence with exercise equipment Exercise tolerated well No report of concerns or symptoms today Strength training completed today  Goals Unmet:  Not Applicable  Comments: Pt able to follow exercise prescription today without complaint.  Will continue to monitor for progression.    Dr. Emily Filbert is Medical Director for [place].  Dr. Ottie Glazier is Medical Director for [facility] Pulmonary Rehabilitation.

## 2021-08-26 ENCOUNTER — Encounter: Payer: PPO | Admitting: *Deleted

## 2021-08-26 DIAGNOSIS — J849 Interstitial pulmonary disease, unspecified: Secondary | ICD-10-CM

## 2021-08-26 NOTE — Progress Notes (Signed)
Daily Session Note  Patient Details  Name: Richard Parrish MRN: [id number] Date of Birth: 12/01/1950 Referring Provider:   Flowsheet Row Pulmonary Rehab from 07/14/2021 in [facility] Cardiac and Pulmonary Rehab  Referring Provider Marshell Garfinkel MD       Encounter Date: 08/26/2021  Check In:  Session Check In - 08/26/21 1618       Check-In   Supervising physician immediately available to respond to emergencies See telemetry face sheet for immediately available ER MD    Location [facility]-Cardiac & Pulmonary Rehab    Staff Present Heath Lark, RN, BSN, Jacklynn Bue, MS, ASCM CEP, Exercise Physiologist;Joseph Tessie Fass, [place]    Virtual Visit No    Medication changes reported     No    Fall or balance concerns reported    No    Warm-up and Cool-down Performed on first and last piece of equipment    Resistance Training Performed Yes    VAD Patient? No    PAD/SET Patient? No      Pain Assessment   Currently in Pain? No/denies                Social History   Tobacco Use  Smoking Status Former   Packs/day: 2.00   Years: 15.00   Total pack years: 30.00   Types: Cigarettes   Quit date: 03/01/1987   Years since quitting: 34.5  Smokeless Tobacco Never  Tobacco Comments   03/02/21-Pt instructed to not vape or drink alcohol for 24 hours prior to surgery.     Goals Met:  Proper associated with RPD/PD & O2 Sat Independence with exercise equipment Exercise tolerated well No report of concerns or symptoms today  Goals Unmet:  Not Applicable  Comments: Pt able to follow exercise prescription today without complaint.  Will continue to monitor for progression.    Dr. Emily Filbert is Medical Director for [place].  Dr. Ottie Glazier is Medical Director for [facility] Pulmonary Rehabilitation.

## 2021-08-30 ENCOUNTER — Encounter: Payer: PPO | Attending: Pulmonary Disease

## 2021-08-30 DIAGNOSIS — J849 Interstitial pulmonary disease, unspecified: Secondary | ICD-10-CM | POA: Insufficient documentation

## 2021-09-01 DIAGNOSIS — J849 Interstitial pulmonary disease, unspecified: Secondary | ICD-10-CM

## 2021-09-01 NOTE — Progress Notes (Signed)
Daily Session Note  Patient Details  Name: Richard Parrish MRN: [id number] Date of Birth: Sep 20, 1950 Referring Provider:   Flowsheet Row Pulmonary Rehab from 07/14/2021 in [facility] Cardiac and Pulmonary Rehab  Referring Provider Marshell Garfinkel MD       Encounter Date: 09/01/2021  Check In:  Session Check In - 09/01/21 1540       Check-In   Supervising physician immediately available to respond to emergencies See telemetry face sheet for immediately available ER MD    Location [facility]-Cardiac & Pulmonary Rehab    Staff Present Antionette Fairy, BS, Exercise Physiologist;Susanne Bice, RN, BSN, CCRP;Joseph Hood, RCP,RRT,BSRT;Chassidy Layson [place], MPA, RN    Virtual Visit No    Medication changes reported     No    Fall or balance concerns reported    No    Tobacco Cessation No Change    Warm-up and Cool-down Performed on first and last piece of equipment    Resistance Training Performed Yes    VAD Patient? No    PAD/SET Patient? No      Pain Assessment   Currently in Pain? No/denies    Multiple Pain Sites No                Social History   Tobacco Use  Smoking Status Former   Packs/day: 2.00   Years: 15.00   Total pack years: 30.00   Types: Cigarettes   Quit date: 03/01/1987   Years since quitting: 34.5  Smokeless Tobacco Never  Tobacco Comments   03/02/21-Pt instructed to not vape or drink alcohol for 24 hours prior to surgery.     Goals Met:  Independence with exercise equipment Exercise tolerated well No report of concerns or symptoms today Strength training completed today  Goals Unmet:  Not Applicable  Comments: Pt able to follow exercise prescription today without complaint.  Will continue to monitor for progression.    Dr. Emily Filbert is Medical Director for [place].  Dr. Ottie Glazier is Medical Director for [facility] Pulmonary Rehabilitation.

## 2021-09-02 ENCOUNTER — Encounter: Payer: PPO | Admitting: *Deleted

## 2021-09-02 DIAGNOSIS — J849 Interstitial pulmonary disease, unspecified: Secondary | ICD-10-CM

## 2021-09-02 NOTE — Progress Notes (Signed)
Daily Session Note  Patient Details  Name: Richard Parrish MRN: [id number] Date of Birth: 04-17-1950 Referring Provider:   Flowsheet Row Pulmonary Rehab from 07/14/2021 in [facility] Cardiac and Pulmonary Rehab  Referring Provider Marshell Garfinkel MD       Encounter Date: 09/02/2021  Check In:  Session Check In - 09/02/21 1555       Check-In   Supervising physician immediately available to respond to emergencies See telemetry face sheet for immediately available ER MD    Location [facility]-Cardiac & Pulmonary Rehab    Staff Present Justin Mend, RCP,RRT,BSRT;Jessica Sully, MA, RCEP, CCRP, CCET;Noah Tickle, BS, Exercise Physiologist;Meklit Cotta, RN, BSN, CCRP    Virtual Visit No    Medication changes reported     No    Fall or balance concerns reported    No    Tobacco Cessation No Change    Warm-up and Cool-down Performed on first and last piece of equipment    Resistance Training Performed Yes    VAD Patient? No    PAD/SET Patient? No      Pain Assessment   Currently in Pain? No/denies    Multiple Pain Sites No                Social History   Tobacco Use  Smoking Status Former   Packs/day: 2.00   Years: 15.00   Total pack years: 30.00   Types: Cigarettes   Quit date: 03/01/1987   Years since quitting: 34.5  Smokeless Tobacco Never  Tobacco Comments   03/02/21-Pt instructed to not vape or drink alcohol for 24 hours prior to surgery.     Goals Met:  Proper associated with RPD/PD & O2 Sat Independence with exercise equipment Exercise tolerated well No report of concerns or symptoms today  Goals Unmet:  Not Applicable  Comments: Pt able to follow exercise prescription today without complaint.  Will continue to monitor for progression.    Dr. Emily Filbert is Medical Director for [place].  Dr. Ottie Glazier is Medical Director for [facility] Pulmonary Rehabilitation.

## 2021-09-02 NOTE — Progress Notes (Signed)
Daily Session Note  Patient Details  Name: Richard Parrish MRN: [id number] Date of Birth: 07/09/1950 Referring Provider:   Flowsheet Row Pulmonary Rehab from 07/14/2021 in [facility] Cardiac and Pulmonary Rehab  Referring Provider Marshell Garfinkel MD       Encounter Date: 09/02/2021  Check In:  Session Check In - 09/02/21 1555       Check-In   Supervising physician immediately available to respond to emergencies See telemetry face sheet for immediately available ER MD    Location [facility]-Cardiac & Pulmonary Rehab    Staff Present Justin Mend, RCP,RRT,BSRT;Jessica [place], MA, RCEP, CCRP, CCET;Noah Tickle, BS, Exercise Physiologist;Franciszek Platten, RN, BSN, CCRP    Virtual Visit No    Medication changes reported     No    Fall or balance concerns reported    No    Tobacco Cessation No Change    Warm-up and Cool-down Performed on first and last piece of equipment    Resistance Training Performed Yes    VAD Patient? No    PAD/SET Patient? No      Pain Assessment   Currently in Pain? No/denies    Multiple Pain Sites No                Social History   Tobacco Use  Smoking Status Former   Packs/day: 2.00   Years: 15.00   Total pack years: 30.00   Types: Cigarettes   Quit date: 03/01/1987   Years since quitting: 34.5  Smokeless Tobacco Never  Tobacco Comments   03/02/21-Pt instructed to not vape or drink alcohol for 24 hours prior to surgery.     Goals Met:  Independence with exercise equipment Exercise tolerated well No report of concerns or symptoms today  Goals Unmet:  Not Applicable  Comments: Pt able to follow exercise prescription today without complaint.  Will continue to monitor for progression.    Dr. Emily Filbert is Medical Director for [place].  Dr. Ottie Glazier is Medical Director for [facility] Pulmonary Rehabilitation.

## 2021-09-06 DIAGNOSIS — J849 Interstitial pulmonary disease, unspecified: Secondary | ICD-10-CM

## 2021-09-06 NOTE — Progress Notes (Signed)
Daily Session Note  Patient Details  Name: Richard Parrish MRN: [id number] Date of Birth: 05-Apr-1950 Referring Provider:   Flowsheet Row Pulmonary Rehab from 07/14/2021 in [facility] Cardiac and Pulmonary Rehab  Referring Provider Marshell Garfinkel MD       Encounter Date: 09/06/2021  Check In:  Session Check In - 09/06/21 1544       Check-In   Supervising physician immediately available to respond to emergencies See telemetry face sheet for immediately available ER MD    Location [facility]-Cardiac & Pulmonary Rehab    Staff Present Carson Myrtle, BS, RRT, CPFT;Joseph Karie Fetch, MPA, RN    Virtual Visit No    Medication changes reported     No    Fall or balance concerns reported    No    Tobacco Cessation No Change    Warm-up and Cool-down Performed on first and last piece of equipment    Resistance Training Performed Yes    VAD Patient? No    PAD/SET Patient? No      Pain Assessment   Currently in Pain? No/denies                Social History   Tobacco Use  Smoking Status Former   Packs/day: 2.00   Years: 15.00   Total pack years: 30.00   Types: Cigarettes   Quit date: 03/01/1987   Years since quitting: 34.5  Smokeless Tobacco Never  Tobacco Comments   03/02/21-Pt instructed to not vape or drink alcohol for 24 hours prior to surgery.     Goals Met:  Independence with exercise equipment Exercise tolerated well No report of concerns or symptoms today Strength training completed today  Goals Unmet:  Not Applicable  Comments: Pt able to follow exercise prescription today without complaint.  Will continue to monitor for progression.    Dr. Emily Filbert is Medical Director for [place].  Dr. Ottie Glazier is Medical Director for [facility] Pulmonary Rehabilitation.

## 2021-09-08 ENCOUNTER — Encounter: Payer: Self-pay | Admitting: *Deleted

## 2021-09-08 DIAGNOSIS — J849 Interstitial pulmonary disease, unspecified: Secondary | ICD-10-CM

## 2021-09-08 NOTE — Progress Notes (Signed)
Pulmonary Individual Treatment Plan  Patient Details  Name: Richard Parrish MRN: [id number] Date of Birth: 1950-05-26 Referring Provider:   Flowsheet Row Pulmonary Rehab from 07/14/2021 in [facility] Cardiac and Pulmonary Rehab  Referring Provider Marshell Garfinkel MD       Initial Encounter Date:  Flowsheet Row Pulmonary Rehab from 07/14/2021 in [facility] Cardiac and Pulmonary Rehab  Date 07/14/21       Visit Diagnosis: ILD (interstitial lung disease) ([place])  Patient's Home Medications on Admission:  Current Outpatient Medications:    acetaminophen (TYLENOL) 500 MG tablet, Take 1 tablet (500 mg total) by mouth every 6 (six) hours as needed for mild pain., Disp: , Rfl:    ALPRAZolam (XANAX) 0.5 MG tablet, Take 0.5-1 tablets (0.25-0.5 mg total) by mouth 3 (three) times daily as needed for anxiety or sleep., Disp: 5 tablet, Rfl: 0   antiseptic oral rinse (BIOTENE) LIQD, 15 mLs by Mouth Rinse route daily as needed for dry mouth., Disp: , Rfl:    apixaban (ELIQUIS) 5 MG TABS tablet, Take 1 tablet (5 mg total) by mouth 2 (two) times daily., Disp: 180 tablet, Rfl: 1   atorvastatin (LIPITOR) 80 MG tablet, TAKE 1 TABLET BY MOUTH EVERY DAY (Patient taking differently: Take 80 mg by mouth daily.), Disp: 90 tablet, Rfl: 1   cholecalciferol (VITAMIN D3) 25 MCG (1000 UNIT) tablet, Take 1,000 Units by mouth in the morning., Disp: , Rfl:    clopidogrel (PLAVIX) 75 MG tablet, TAKE 1 TABLET BY MOUTH EVERY DAY (Patient taking differently: Take 75 mg by mouth daily.), Disp: 30 tablet, Rfl: 11   gabapentin (NEURONTIN) 100 MG capsule, Take 2 capsules (200 mg total) by mouth 3 (three) times daily., Disp: 90 capsule, Rfl: 1   guaiFENesin (MUCINEX) 600 MG 12 hr tablet, Take 1 tablet (600 mg total) by mouth 2 (two) times daily as needed. (Patient taking differently: Take 600 mg by mouth 2 (two) times daily as needed for cough or to loosen phlegm.), Disp: , Rfl:    HYDROcodone-acetaminophen (NORCO) 10-325 MG tablet, Take 1  tablet by mouth every 4 (four) hours as needed for moderate pain or severe pain., Disp: 10 tablet, Rfl: 0   hyoscyamine (LEVSIN, ANASPAZ) 0.125 MG tablet, Take 0.125 mg by mouth every 4 (four) hours as needed for cramping. , Disp: , Rfl:    metoprolol succinate (TOPROL-XL) 50 MG 24 hr tablet, Take 0.5 tablets (25 mg total) by mouth daily. Take with or immediately following a meal., Disp: , Rfl:    nitroGLYCERIN (NITROSTAT) 0.4 MG SL tablet, Place 0.4 mg under the tongue every 5 (five) minutes as needed for chest pain., Disp: , Rfl:    pantoprazole (PROTONIX) 40 MG tablet, TAKE 1 TABLET BY MOUTH EVERY DAY BEFORE BREAKFAST, Disp: 90 tablet, Rfl: 1   PARoxetine (PAXIL) 40 MG tablet, Take 40 mg by mouth in the morning., Disp: , Rfl: 6   Pirfenidone 267 MG TABS, Take 3 tablets (801 mg total) by mouth 3 (three) times daily with meals., Disp: 270 tablet, Rfl: 1   predniSONE (DELTASONE) 10 MG tablet, Take 87m daily for 1 month then reduce 113mmonthly, Disp: 120 tablet, Rfl: 2   Probiotic Product (PROBIOTIC PO), Take 1 capsule by mouth daily., Disp: , Rfl:    sodium chloride (OCEAN) 0.65 % SOLN nasal spray, Place 1 spray into both nostrils daily as needed for congestion., Disp: , Rfl:    sulfamethoxazole-trimethoprim (BACTRIM DS) 800-160 MG tablet, Take 1 tablet by mouth 3 (three)  times a week., Disp: 12 tablet, Rfl: 5  Past Medical History: Past Medical History:  Diagnosis Date   Anxiety    Arthritis    "lower back" (04/07/2017)   BPH (benign prostatic hypertrophy)    C. difficile diarrhea    Chronic lower back pain    Coronary artery disease 1989   cabg with dvg to rca    Depression    GERD (gastroesophageal reflux disease)    Hyperlipidemia    Hypertension    Insomnia    Myocardial infarction ([place]) 2002   OSA on CPAP    cpap setting of 60 per pt   Osteoarthritis    "hips" (04/07/2017)   PAD (peripheral artery disease) ([place])    Peripheral vascular disease ([place])    Pneumonia 1989   S/P CABG    Pulmonary embolism ([place]) 05/18/2021   Pulmonary fibrosis ([place])    "one time" (04/07/2017)   Spinal stenosis     Tobacco Use: Social History   Tobacco Use  Smoking Status Former   Packs/day: 2.00   Years: 15.00   Total pack years: 30.00   Types: Cigarettes   Quit date: 03/01/1987   Years since quitting: 34.5  Smokeless Tobacco Never  Tobacco Comments   03/02/21-Pt instructed to not vape or drink alcohol for 24 hours prior to surgery.     Labs: Review Flowsheet  More data exists      Latest Ref Rng & Units 10/04/2020 10/05/2020 03/02/2021 04/13/2021 05/18/2021  Labs for ITP Cardiac and Pulmonary Rehab  Cholestrol 0 - 200 mg/dL 97  - - - -  LDL (calc) 0 - 99 mg/dL 53  - - - -  HDL-C >40 mg/dL 35  - - - -  Trlycerides <150 mg/dL 46  - - - -  PH, Arterial 7.350 - 7.450 - 7.355  7.402  - -  PCO2 arterial 32.0 - 48.0 mmHg - 41.9  38.9  - -  Bicarbonate 20.0 - 28.0 mmol/L - 23.4  23.7  23.7  - -  TCO2 22 - 32 mmol/L - 25  25  - 29  25   Acid-base deficit 0.0 - 2.0 mmol/L - 2.0  2.0  0.5  - -  O2 Saturation % - 99.0  70.0  97.6  - -     Pulmonary Assessment Scores:  Pulmonary Assessment Scores     Row Name 07/14/21 1228         ADL UCSD   ADL Phase Entry     SOB Score total 55     Rest 0     Walk 3     Stairs 4     Bath 3     Dress 2     Shop 2       CAT Score   CAT Score 21       mMRC Score   mMRC Score 3              UCSD: Self-administered rating of dyspnea associated with activities of daily living (ADLs) 6-point scale (0 = "not at all" to 5 = "maximal or unable to do because of breathlessness")  Scoring Scores range from 0 to 120.  Minimally important difference is 5 units  CAT: CAT can identify the health impairment of COPD patients and is better correlated with disease progression.  CAT has a scoring range of zero to 40. The CAT score is classified into four groups of low (less than 10), medium (10 -  20), high (21-30) and very high (31-40) based on the  impact level of disease on health status. A CAT score over 10 suggests significant symptoms.  A worsening CAT score could be explained by an exacerbation, poor medication adherence, poor inhaler technique, or progression of COPD or comorbid conditions.  CAT MCID is 2 points  mMRC: mMRC (Modified Medical Research Council) Dyspnea Scale is used to assess the degree of baseline functional disability in patients of respiratory disease due to dyspnea. No minimal important difference is established. A decrease in score of 1 point or greater is considered a positive change.   Pulmonary Function Assessment:   Exercise Target Goals: Exercise Program Goal: Individual exercise prescription set using results from initial 6 min walk test and THRR while considering  patient's activity barriers and safety.   Exercise Prescription Goal: Initial exercise prescription builds to 30-45 minutes a day of aerobic activity, 2-3 days per week.  Home exercise guidelines will be given to patient during program as part of exercise prescription that the participant will acknowledge.  Education: Aerobic Exercise: - Group verbal and visual presentation on the components of exercise prescription. Introduces F.I.T.T principle from ACSM for exercise prescriptions.  Reviews F.I.T.T. principles of aerobic exercise including progression. Written material given at graduation.   Education: Resistance Exercise: - Group verbal and visual presentation on the components of exercise prescription. Introduces F.I.T.T principle from ACSM for exercise prescriptions  Reviews F.I.T.T. principles of resistance exercise including progression. Written material given at graduation.    Education: Exercise & Equipment Safety: - Individual verbal instruction and demonstration of equipment use and safety with use of the equipment. Flowsheet Row Pulmonary Rehab from 08/25/2021 in [facility] Cardiac and Pulmonary Rehab  Education need identified 07/14/21   Date 07/14/21  Educator [place]  Instruction Review Code 1- Verbalizes Understanding       Education: Exercise Physiology & General Exercise Guidelines: - Group verbal and written instruction with models to review the exercise physiology of the cardiovascular system and associated critical values. Provides general exercise guidelines with specific guidelines to those with heart or lung disease.  Flowsheet Row Pulmonary Rehab from 08/25/2021 in [facility] Cardiac and Pulmonary Rehab  Education need identified 07/14/21  Date 08/25/21  Educator [place]  Instruction Review Code 1- [employer] Understanding       Education: Flexibility, Balance, Mind/Body Relaxation: - Group verbal and visual presentation with interactive activity on the components of exercise prescription. Introduces F.I.T.T principle from ACSM for exercise prescriptions. Reviews F.I.T.T. principles of flexibility and balance exercise training including progression. Also discusses the mind body connection.  Reviews various relaxation techniques to help reduce and manage stress (i.e. Deep breathing, progressive muscle relaxation, and visualization). Balance handout provided to take home. Written material given at graduation.   Activity Barriers & Risk Stratification:  Activity Barriers & Cardiac Risk Stratification - 07/14/21 1301       Activity Barriers & Cardiac Risk Stratification   Activity Barriers Shortness of Breath;Deconditioning;Back Problems;History of Falls;Joint Problems;Muscular Weakness             6 Minute Walk:  6 Minute Walk     Row Name 07/14/21 1301         6 Minute Walk   Phase Initial     Distance 815 feet     Walk Time 6 minutes     # of Rest Breaks 1  Stopped at 5:51     MPH 1.54     METS 1.92     RPE  11     Perceived Dyspnea  2     VO2 Peak 6.74     Symptoms Yes (comment)     Comments SOB, back pain 5/10, hip pain 4/10     Resting HR 79 bpm     Resting BP 110/68     Resting Oxygen Saturation   93 %     Exercise Oxygen Saturation  during 6 min walk 85 %     Max Ex. HR 97 bpm     Max Ex. BP 126/72     2 Minute Post BP 112/68       Interval HR   1 Minute HR 79     2 Minute HR 92     3 Minute HR 97     4 Minute HR 97     5 Minute HR 97     6 Minute HR 96     2 Minute Post HR 75     Interval Heart Rate? Yes       Interval Oxygen   Interval Oxygen? Yes     Baseline Oxygen Saturation % 93 %     1 Minute Oxygen Saturation % 92 %     1 Minute Liters of Oxygen 5 L     2 Minute Oxygen Saturation % 90 %     2 Minute Liters of Oxygen 5 L     3 Minute Oxygen Saturation % 86 %     3 Minute Liters of Oxygen 5 L     4 Minute Oxygen Saturation % 85 %     4 Minute Liters of Oxygen 5 L     5 Minute Oxygen Saturation % 86 %     5 Minute Liters of Oxygen 5 L     6 Minute Oxygen Saturation % 87 %     6 Minute Liters of Oxygen 5 L     2 Minute Post Oxygen Saturation % 96 %     2 Minute Post Liters of Oxygen 5 L             Oxygen Initial Assessment:  Oxygen Initial Assessment - 07/14/21 1228       Home Oxygen   Home Oxygen Device Home Concentrator;E-Tanks    Sleep Oxygen Prescription Continuous    Liters per minute 4    Home Exercise Oxygen Prescription Continuous    Liters per minute 4    Home Resting Oxygen Prescription Continuous    Liters per minute 4    Compliance with Home Oxygen Use Yes      Initial 6 min Walk   Oxygen Used Pulsed    Liters per minute 5      Program Oxygen Prescription   Program Oxygen Prescription Continuous    Liters per minute 4      Intervention   Short Term Goals To learn and exhibit compliance with exercise, home and travel O2 prescription;To learn and understand importance of maintaining oxygen saturations>88%;To learn and demonstrate proper use of respiratory medications;To learn and understand importance of monitoring SPO2 with pulse oximeter and demonstrate accurate use of the pulse oximeter.;To learn and demonstrate proper pursed  lip breathing techniques or other breathing techniques.     Long  Term Goals Exhibits compliance with exercise, home  and travel O2 prescription;Maintenance of O2 saturations>88%;Compliance with respiratory medication;Verbalizes importance of monitoring SPO2 with pulse oximeter and return demonstration;Exhibits proper breathing techniques, such as pursed lip breathing or other method taught during  program session;Demonstrates proper use of MDI's             Oxygen Re-Evaluation:  Oxygen Re-Evaluation     Row Name 07/19/21 1534 07/22/21 1543 08/23/21 1605         Program Oxygen Prescription   Program Oxygen Prescription Continuous Continuous Continuous     Liters per minute _0 Home Oxygen   Home Oxygen Device Home Concentrator;E-Tanks Home Concentrator;E-Tanks Home Concentrator;E-Tanks     Sleep Oxygen Prescription Continuous Continuous Continuous     Liters per minute _1 Home Exercise Oxygen Prescription Continuous Continuous Continuous     Liters per minute _2 Home Resting Oxygen Prescription Continuous Continuous Continuous     Liters per minute _3 Compliance with Home Oxygen Use Yes Yes Yes  He usually wears his O2, but sometimes he will see if his O2 drops at certain times - such as walking down to rehab.       Goals/Expected Outcomes   Short Term Goals To learn and exhibit compliance with exercise, home and travel O2 prescription;To learn and understand importance of maintaining oxygen saturations>88%;To learn and understand importance of monitoring SPO2 with pulse oximeter and demonstrate accurate use of the pulse oximeter.;To learn and demonstrate proper pursed lip breathing techniques or other breathing techniques.  To learn and understand importance of monitoring SPO2 with pulse oximeter and demonstrate accurate use of the pulse oximeter.;To learn and understand importance of maintaining oxygen saturations>88% To learn and understand importance of  monitoring SPO2 with pulse oximeter and demonstrate accurate use of the pulse oximeter.;To learn and understand importance of maintaining oxygen saturations>88%     Long  Term Goals Exhibits compliance with exercise, home  and travel O2 prescription;Maintenance of O2 saturations>88%;Verbalizes importance of monitoring SPO2 with pulse oximeter and return demonstration;Exhibits proper breathing techniques, such as pursed lip breathing or other method taught during program session Verbalizes importance of monitoring SPO2 with pulse oximeter and return demonstration;Maintenance of O2 saturations>88% Verbalizes importance of monitoring SPO2 with pulse oximeter and return demonstration;Maintenance of O2 saturations>88%     Comments Reviewed PLB technique with pt.  Talked about how it works and it's importance in maintaining their exercise saturations. He has a pulse oximeter to check his oxygen saturation at home. Informed and explained why it is important to have one. Reviewed that oxygen saturations should be 88 percent and above. Bill reports his shortness of breath has been improving since starting rehab. He checks his O2 all the time and keeps a pulse oximeter on him - he makes sure his O2 does not dip below 88. He practices PLB when he gets short of breath. He also practices diaphragmatic breathing laying down.     Goals/Expected Outcomes Short: Become more profiecient at using PLB.   Long: Become independent at using PLB. Short: monitor oxygen at home with exertion. Long: maintain oxygen saturations above 88 percent independently. Short: continue to check O2 Long: maintain oxygen saturations above 88 percent independently.              Oxygen Discharge (Final Oxygen Re-Evaluation):  Oxygen Re-Evaluation - 08/23/21 1605       Program Oxygen Prescription   Program Oxygen Prescription Continuous    Liters per minute 4      Home Oxygen   Home Oxygen Device Home Concentrator;E-Tanks    Sleep Oxygen  Prescription  Continuous    Liters per minute 4    Home Exercise Oxygen Prescription Continuous    Liters per minute 4    Home Resting Oxygen Prescription Continuous    Liters per minute 4    Compliance with Home Oxygen Use Yes   He usually wears his O2, but sometimes he will see if his O2 drops at certain times - such as walking down to rehab.     Goals/Expected Outcomes   Short Term Goals To learn and understand importance of monitoring SPO2 with pulse oximeter and demonstrate accurate use of the pulse oximeter.;To learn and understand importance of maintaining oxygen saturations>88%    Long  Term Goals Verbalizes importance of monitoring SPO2 with pulse oximeter and return demonstration;Maintenance of O2 saturations>88%    Comments Bill reports his shortness of breath has been improving since starting rehab. He checks his O2 all the time and keeps a pulse oximeter on him - he makes sure his O2 does not dip below 88. He practices PLB when he gets short of breath. He also practices diaphragmatic breathing laying down.    Goals/Expected Outcomes Short: continue to check O2 Long: maintain oxygen saturations above 88 percent independently.             Initial Exercise Prescription:  Initial Exercise Prescription - 07/14/21 1300       Date of Initial Exercise RX and Referring Provider   Date 07/14/21    Referring Provider Marshell Garfinkel MD      Oxygen   Oxygen Continuous    Liters 4    Maintain Oxygen Saturation 88% or higher      NuStep   Level 1    SPM 80    Minutes 15    METs 1.9      T5 Nustep   Level 1    SPM 80    Minutes 15    METs 1.9      Biostep-RELP   Level 1    SPM 50    Minutes 15    METs 1.9      Track   Laps 15    Minutes 15    METs 1.82      Prescription Details   Frequency (times per week) 3    Duration Progress to 30 minutes of continuous aerobic without signs/symptoms of physical distress      Intensity   THRR 40-80% of Max Heartrate 106 -  134    Ratings of Perceived Exertion 11-13    Perceived Dyspnea 0-4      Progression   Progression Continue to progress workloads to maintain intensity without signs/symptoms of physical distress.      Resistance Training   Training Prescription Yes    Weight 3 lb    Reps 10-15             Perform Capillary Blood Glucose checks as needed.  Exercise Prescription Changes:   Exercise Prescription Changes     Row Name 07/14/21 1300 07/29/21 0800 08/13/21 0800 08/23/21 1100 09/06/21 1600     Response to Exercise   Blood Pressure (Admit) 110/68 114/66 134/70 102/68 146/70   Blood Pressure (Exercise) 126/72 134/70 148/78 138/74 --   Blood Pressure (Exit) 112/68 104/60 124/64 102/64 124/68   Heart Rate (Admit) 79 bpm 73 bpm 90 bpm 87 bpm 94 bpm   Heart Rate (Exercise) 97 bpm 103 bpm 94 bpm 100 bpm 107 bpm   Heart Rate (Exit) 75 bpm 85 bpm  89 bpm 90 bpm 98 bpm   Oxygen Saturation (Admit) 93 % 95 % 91 % 95 % 88 %   Oxygen Saturation (Exercise) 85 % 86 % 92 % 93 % 92 %   Oxygen Saturation (Exit) 96 % 97 % 93 % 96 % 98 %   Rating of Perceived Exertion (Exercise) _0 Perceived Dyspnea (Exercise) _1 Symptoms SOB, back pain 5/10, hip pain 4/10 SOB SOB SOB SOB   Comments walk test results -- -- -- --   Duration -- Progress to 30 minutes of  aerobic without signs/symptoms of physical distress Continue with 30 min of aerobic exercise without signs/symptoms of physical distress. Continue with 30 min of aerobic exercise without signs/symptoms of physical distress. Continue with 30 min of aerobic exercise without signs/symptoms of physical distress.   Intensity -- THRR unchanged THRR unchanged THRR unchanged THRR unchanged     Progression   Progression -- Continue to progress workloads to maintain intensity without signs/symptoms of physical distress. Continue to progress workloads to maintain intensity without signs/symptoms of physical distress. Continue to progress  workloads to maintain intensity without signs/symptoms of physical distress. Continue to progress workloads to maintain intensity without signs/symptoms of physical distress.   Average METs -- 2.27 2.35 2.3 2.59     Resistance Training   Training Prescription -- Yes Yes Yes Yes   Weight -- 3 lb 3 lb 3 lb 6 lb   Reps -- 10-15 10-15 10-15 10-15     Interval Training   Interval Training -- -- -- -- No     Oxygen   Oxygen -- Continuous Continuous Continuous Continuous   Liters -- _2 NuStep   Level -- _3 Minutes -- _4 METs -- 2.4 2.4 2.8 2.6     REL-XR   Level -- -- -- -- 6   Minutes -- -- -- -- 15   METs -- -- -- -- 3.1     T5 Nustep   Level -- 3 5 -- --   Minutes -- 15 15 -- --   METs -- 2.4 2.3 -- --     Biostep-RELP   Level -- 2 -- -- --   Minutes -- 15 -- -- --   METs -- 2 -- -- --     Track   Laps -- 15 -- 24 20   Minutes -- _5 METs -- 1.82 -- 2.31 2.09     Oxygen   Maintain Oxygen Saturation -- 88% or higher 88% or higher 88% or higher 88% or higher            Exercise Comments:   Exercise Comments     Row Name 07/19/21 1533           Exercise Comments First full day of exercise!  Patient was oriented to gym and equipment including functions, settings, policies, and procedures.  Patient's individual exercise prescription and treatment plan were reviewed.  All starting workloads were established based on the results of the 6 minute walk test done at initial orientation visit.  The plan for exercise progression was also introduced and progression will be customized based on patient's performance and goals.                Exercise Goals and Review:   Exercise Goals  [place] Name 07/14/21 1309             Exercise Goals   Increase Physical Activity Yes       Intervention Provide advice, education, support and counseling about physical activity/exercise needs.;Develop an individualized exercise  prescription for aerobic and resistive training based on initial evaluation findings, risk stratification, comorbidities and participant's personal goals.       Expected Outcomes Short Term: Attend rehab on a regular basis to increase amount of physical activity.;Long Term: Add in home exercise to make exercise part of routine and to increase amount of physical activity.;Long Term: Exercising regularly at least 3-5 days a week.       Increase Strength and Stamina Yes       Intervention Provide advice, education, support and counseling about physical activity/exercise needs.;Develop an individualized exercise prescription for aerobic and resistive training based on initial evaluation findings, risk stratification, comorbidities and participant's personal goals.       Expected Outcomes Short Term: Increase workloads from initial exercise prescription for resistance, speed, and METs.;Short Term: Perform resistance training exercises routinely during rehab and add in resistance training at home;Long Term: Improve cardiorespiratory fitness, muscular endurance and strength as measured by increased METs and functional capacity (6MWT)       Able to understand and use rate of perceived exertion (RPE) scale Yes       Intervention Provide education and explanation on how to use RPE scale       Expected Outcomes Short Term: Able to use RPE daily in rehab to express subjective intensity level;Long Term:  Able to use RPE to guide intensity level when exercising independently       Able to understand and use Dyspnea scale Yes       Intervention Provide education and explanation on how to use Dyspnea scale       Expected Outcomes Short Term: Able to use Dyspnea scale daily in rehab to express subjective sense of shortness of breath during exertion;Long Term: Able to use Dyspnea scale to guide intensity level when exercising independently       Knowledge and understanding of Target Heart Rate Range (THRR) Yes        Intervention Provide education and explanation of THRR including how the numbers were predicted and where they are located for reference       Expected Outcomes Short Term: Able to state/look up THRR;Long Term: Able to use THRR to govern intensity when exercising independently;Short Term: Able to use daily as guideline for intensity in rehab       Able to check pulse independently Yes       Intervention Provide education and demonstration on how to check pulse in carotid and radial arteries.;Review the importance of being able to check your own pulse for safety during independent exercise       Expected Outcomes Short Term: Able to explain why pulse checking is important during independent exercise;Long Term: Able to check pulse independently and accurately       Understanding of Exercise Prescription Yes       Intervention Provide education, explanation, and written materials on patient's individual exercise prescription       Expected Outcomes Short Term: Able to explain program exercise prescription;Long Term: Able to explain home exercise prescription to exercise independently                Exercise Goals Re-Evaluation :  Exercise Goals Re-Evaluation     [place] Name 07/19/21 1533 07/29/21  [id number] 08/13/21 0819 08/23/21 1143 08/23/21 1608     Exercise Goal Re-Evaluation   Exercise Goals Review Increase Physical Activity;Knowledge and understanding of Target Heart Rate Range (THRR);Understanding of Exercise Prescription;Able to understand and use rate of perceived exertion (RPE) scale;Increase Strength and Stamina;Able to understand and use Dyspnea scale;Able to check pulse independently Increase Physical Activity;Increase Strength and Stamina;Understanding of Exercise Prescription Increase Physical Activity;Increase Strength and Stamina;Understanding of Exercise Prescription Increase Physical Activity;Increase Strength and Stamina;Understanding of Exercise Prescription Increase Physical  Activity;Increase Strength and Stamina;Understanding of Exercise Prescription   Comments Reviewed RPE and dyspnea scales, THR and program prescription with pt today.  Pt voiced understanding and was given a copy of goals to take home. [name] has had a good start in rehab. He has increased his load to level 4 on the T4 machine. He has also tolerated 3 lb hand weights for resistance training. We will continue to monitor Bill's progress in the program. [name] is doing well in rehab.  He is up to level 5 on the T5! We will encourage him to try 4 lb hand weights.  We will continue to monitor his progress. [name] is doing well in rehab. He increased to 24 laps on the track. He also improved to level 6 on the T4 machine as well. We will encourage him to try 4 lb hand weights for resistance training. We will continue to monitor his progress in the program. [name] reports staying active and working a lot. He is still a member of [employer].   Expected Outcomes Short: Use RPE daily to regulate intensity. Long: Follow program prescription in THR. Short: Continue to attend rehab. Long: Improve MET levels during exercise. Short: Try 4 lb hand weights Long: Continue to improve stamina Short: Try 4 lb hand weights Long: Continue to improve MET levels --    Row Name 09/06/21 1604             Exercise Goal Re-Evaluation   Exercise Goals Review Increase Physical Activity;Increase Strength and Stamina;Understanding of Exercise Prescription       Comments [name] is doing well in rehab.  He is up to level 4 on the NuStep and level 6 on the XR.  He is also now using 6 lb weights.  He does have a difficult time arriving on time and says her keeps getting lost but has not brought it up to doctor yet.  We will continue to montior his progress.       Expected Outcomes Short: Continue to move up on workloads Long: Continue to improve stamina                Discharge Exercise Prescription (Final Exercise Prescription Changes):   Exercise Prescription Changes - 09/06/21 1600       Response to Exercise   Blood Pressure (Admit) 146/70    Blood Pressure (Exit) 124/68    Heart Rate (Admit) 94 bpm    Heart Rate (Exercise) 107 bpm    Heart Rate (Exit) 98 bpm    Oxygen Saturation (Admit) 88 %    Oxygen Saturation (Exercise) 92 %    Oxygen Saturation (Exit) 98 %    Rating of Perceived Exertion (Exercise) 14    Perceived Dyspnea (Exercise) 3    Symptoms SOB    Duration Continue with 30 min of aerobic exercise without signs/symptoms of physical distress.    Intensity THRR unchanged      Progression   Progression Continue to progress workloads to maintain intensity  without signs/symptoms of physical distress.    Average METs 2.59      Resistance Training   Training Prescription Yes    Weight 6 lb    Reps 10-15      Interval Training   Interval Training No      Oxygen   Oxygen Continuous    Liters 3      NuStep   Level 4    Minutes 15    METs 2.6      REL-XR   Level 6    Minutes 15    METs 3.1      Track   Laps 20    Minutes 15    METs 2.09      Oxygen   Maintain Oxygen Saturation 88% or higher             Nutrition:  Target Goals: Understanding of nutrition guidelines, daily intake of sodium <1511m, cholesterol <2065m calories 30% from fat and 7% or less from saturated fats, daily to have 5 or more servings of fruits and vegetables.  Education: All About Nutrition: -Group instruction provided by verbal, written material, interactive activities, discussions, models, and posters to present general guidelines for heart healthy nutrition including fat, fiber, MyPlate, the role of sodium in heart healthy nutrition, utilization of the nutrition label, and utilization of this knowledge for meal planning. Follow up email sent as well. Written material given at graduation. [facility]  Date 07/21/21  [facility]  Review Code 1- Verbalizes Understanding       Biometrics:  Pre Biometrics - 07/14/21 1300       Pre Biometrics   Height 6' 0.5" (1.842 m)    Weight 209 lb 12.8 oz (95.2 kg)    BMI (Calculated) 28.05    Single Leg Stand 9 seconds              Nutrition Therapy Plan and Nutrition Goals:  Nutrition Therapy & Goals - 07/14/21 1257       Personal Nutrition Goals   Comments Patient was 231 lb prior to his lung surgery in January 2023. Wants to gain muscle back      Intervention Plan   Intervention Prescribe, educate and counsel regarding individualized specific dietary modifications aiming towards targeted core components such as weight, hypertension, lipid management, diabetes, heart failure and other comorbidities.    Expected Outcomes Short Term Goal: Understand basic principles of dietary content, such as calories, fat, sodium, cholesterol and nutrients.;Short Term Goal: A plan has been developed with personal nutrition goals set during dietitian appointment.;Long Term Goal: Adherence to prescribed nutrition plan.             Nutrition Assessments:  MEDIFICTS Score Key: ?70 Need to make dietary changes  40-70 Heart Healthy Diet ? 40 Therapeutic Level Cholesterol Diet  [facility]  Picture Your Plate Total Score on Admission 40      Picture Your Plate Scores: <4<82nhealthy dietary pattern with much room for improvement. 41-50 Dietary pattern unlikely to meet recommendations for good health and room for improvement. 51-60 More healthful dietary pattern, with some room for improvement.  >60 Healthy dietary pattern, although there may be some specific behaviors that could be improved.   Nutrition Goals Re-Evaluation:  Nutrition Goals Re-Evaluation     [place] 07/22/21 1546 08/23/21 1628  Goals   Nutrition Goal Meet with the dietitian. --      Comment Bill would like to meet with  the dietitian. Resheduled nutrition appt. for 09/06/21 at 3pm.      Expected Outcome Short: meet with the dietitian. Long: adhere to a diet that pertains to him. --               Nutrition Goals Discharge (Final Nutrition Goals Re-Evaluation):  Nutrition Goals Re-Evaluation - 08/23/21 1628       Goals   Comment Resheduled nutrition appt. for 09/06/21 at 3pm.             Psychosocial: Target Goals: Acknowledge presence or absence of significant depression and/or stress, maximize coping skills, provide positive support system. Participant is able to verbalize types and ability to use techniques and skills needed for reducing stress and depression.   Education: Stress, Anxiety, and Depression - Group verbal and visual presentation to define topics covered.  Reviews how body is impacted by stress, anxiety, and depression.  Also discusses healthy ways to reduce stress and to treat/manage anxiety and depression.  Written material given at graduation.   Education: Sleep Hygiene -Provides group verbal and written instruction about how sleep can affect your health.  Define sleep hygiene, discuss sleep cycles and impact of sleep habits. Review good sleep hygiene tips.    Initial Review & Psychosocial Screening:  Initial Psych Review & Screening - 07/05/21 1144       Initial Review   Current issues with Current Anxiety/Panic   occassional anxiety has Xanax for this     Family Dynamics   Good Support System? Yes   wife, 43 years     Barriers   Psychosocial barriers to participate in program There are no identifiable barriers or psychosocial needs.      Screening Interventions   Interventions Encouraged to exercise    Expected Outcomes Short Term goal: Utilizing psychosocial counselor, staff and physician to assist with identification of specific Stressors or current issues interfering with healing process. Setting desired goal for each stressor or current issue identified.;Long Term  Goal: Stressors or current issues are controlled or eliminated.;Short Term goal: Identification and review with participant of any Quality of Life or Depression concerns found by scoring the questionnaire.;Long Term goal: The participant improves quality of Life and PHQ9 Scores as seen by post scores and/or verbalization of changes             Quality of Life Scores:  Scores of 19 and below usually indicate a poorer quality of life in these areas.  A difference of  2-3 points is a clinically meaningful difference.  A difference of 2-3 points in the total score of the Quality of Life Index has been associated with significant improvement in overall quality of life, self-image, physical symptoms, and general health in studies assessing change in quality of life.  PHQ-9: Review Flowsheet  More data exists      07/14/2021 06/01/2016 12/30/2015 11/18/2015 10/13/2015  Depression screen PHQ 2/9  Decreased Interest 1 0 0 - 0  Down, Depressed, Hopeless 1 0 0 0 0  PHQ - 2 Score 2 0 0 0 0  Altered sleeping 2 - - - -  Tired, decreased energy 2 - - - -  Change in appetite 2 - - - -  Feeling bad or failure about yourself  1 - - - -  Trouble concentrating 1 - - - -  Moving slowly or fidgety/restless 0 - - - -  Suicidal thoughts 0 - - - -  PHQ-9 Score 10 - - - -  Difficult doing work/chores Somewhat difficult - - - -   Interpretation of Total Score  Total Score Depression Severity:  1-4 = Minimal depression, 5-9 = Mild depression, 10-14 = Moderate depression, 15-19 = Moderately severe depression, 20-27 = Severe depression   Psychosocial Evaluation and Intervention:  Psychosocial Evaluation - 07/05/21 1159       Psychosocial Evaluation & Interventions   Interventions Encouraged to exercise with the program and follow exercise prescription;Relaxation education    Comments [name] has no barriers to attending the program. He is looking forward to regaining his strength and stamina. 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 He lives with his wife and their fur babies. She is his support. THey havebeen married for 43 years. He wants to get back to his previou strength. He does have meds as needed for anxiety. He does not need the medicine too frequently.  He has lost over 20 pounds after his surgery. Heis ready to see if he can gain back energy and musclemass he has lost    Expected Outcomes STG [name] is able to attend all scheduled sessions, He continues to remain tobacco free.   LTG BIll continues to have progressive improvement with his energy and strength, He uses his anxiety meds even less after discharge    Continue Psychosocial Services  Follow up required by staff             Psychosocial Re-Evaluation:  Psychosocial Re-Evaluation     [place] Name 07/22/21 1547 08/23/21 1608           Psychosocial Re-Evaluation   Current issues with Current Depression;History of Depression;Current Psychotropic Meds Current Depression;History of Depression;Current Psychotropic Meds;Current Sleep Concerns      Comments He states that his mood has improved since he is over the effects of his lung surgery. It took him 3 months to feel better. His mood with him being able to exercise and do more has helped improve his mood dramatically. He has some properties to take care of and wants to do rehab two days a week. He reports his mood has been better with no depressive symptoms recently - he thinks the vacation helped! He went to beach with his wife, nephews, sister-in-law. He went fishing as well and is excited about a fishing trip he is taking in September. He is still taking medication as needed and feels it is helping. He can rely on his wife for support. He loves his dogs, friends and family. He will think of something else when his mind gets stressed. He reports not sleeping well - he sleeps in the same bed as one of the dogs and some of them will wake him up.       Expected Outcomes Short: Start [facility] to help with mood. Long: Maintain a healthy mental state Short: Continue to attend [facility] to help with mood. Long: Maintain a healthy mental state      Interventions Encouraged to attend Pulmonary Rehabilitation for the exercise Encouraged to attend Pulmonary Rehabilitation for the exercise      Continue Psychosocial Services  Follow up required by staff Follow up required by staff               Psychosocial Discharge (Final Psychosocial Re-Evaluation):  Psychosocial Re-Evaluation - 08/23/21 1608       Psychosocial Re-Evaluation  Current issues with Current Depression;History of Depression;Current Psychotropic Meds;Current Sleep Concerns    Comments He reports his mood has been better with no depressive symptoms recently - he thinks the vacation helped! He went to beach with his wife, nephews, sister-in-law. He went fishing as well and is excited about a fishing trip he is taking in September. He is still taking medication as needed and feels it is helping. He can rely on his wife for support. He loves his dogs, friends and family. He will think of something else when his mind gets stressed. He reports not sleeping well - he sleeps in the same bed as one of the dogs and some of them will wake him up.    Expected Outcomes Short: Continue to attend [facility] to help with mood. Long: Maintain a healthy mental state    Interventions Encouraged to attend Pulmonary Rehabilitation for the exercise    Continue Psychosocial Services  Follow up required by staff             Education: Education Goals: Education classes will be provided on a weekly basis, covering required topics. Participant will state understanding/return demonstration of topics presented.  Learning Barriers/Preferences:  Learning Barriers/Preferences - 07/05/21 1158       Learning Barriers/Preferences   Learning Barriers Hearing    Learning Preferences None              General Pulmonary Education Topics:  Infection Prevention: - Provides verbal and written material to individual with discussion of infection control including proper hand washing and proper equipment cleaning during exercise session. Flowsheet Row Pulmonary Rehab from 08/25/2021 in [facility]  Education need identified 07/14/21  Date 07/14/21  Educator [place]  Instruction Review Code 1- Verbalizes Understanding       Falls Prevention: - Provides verbal and written material to individual with discussion of falls prevention and safety. Flowsheet Row Pulmonary Rehab from 07/05/2021 in [facility]  Date 07/05/21  Educator SB  Instruction Review Code 1- Verbalizes Understanding       Chronic Lung Disease Review: - Group verbal instruction with posters, models, PowerPoint presentations and videos,  to review new updates, new respiratory medications, new advancements in procedures and treatments. Providing information on websites and "800" numbers for continued self-education. Includes information about supplement oxygen, available portable oxygen systems, continuous and intermittent flow rates, oxygen safety, concentrators, and Medicare reimbursement for oxygen. Explanation of Pulmonary Drugs, including class, frequency, complications, importance of spacers, rinsing mouth after steroid MDI's, and proper cleaning methods for nebulizers. Review of basic lung anatomy and physiology related to function, structure, and complications of lung disease. Review of risk factors. Discussion about methods for diagnosing sleep apnea and types of masks and machines for OSA. Includes a review of the use of types of environmental controls: home humidity, furnaces, filters, dust mite/pet prevention, HEPA vacuums. Discussion about weather changes, air quality and the benefits of nasal washing. Instruction on Warning signs, infection symptoms, calling MD promptly, preventive  modes, and value of vaccinations. Review of effective airway clearance, coughing and/or vibration techniques. Emphasizing that all should Create an Action Plan. Written material given at graduation. Flowsheet Row Pulmonary Rehab from 08/25/2021 in [facility] and Pulmonary Rehab  Education need identified 07/14/21       AED/CPR: - Group verbal and written instruction with the use of models to demonstrate the basic use of the AED with the basic ABC's of resuscitation.    Anatomy and Cardiac  Procedures: - Group verbal and visual presentation and models provide information about basic cardiac anatomy and function. Reviews the testing methods done to diagnose heart disease and the outcomes of the test results. Describes the treatment choices: Medical Management, Angioplasty, or Coronary Bypass Surgery for treating various heart conditions including Myocardial Infarction, Angina, Valve Disease, and Cardiac Arrhythmias.  Written material given at graduation.   Medication Safety: - Group verbal and visual instruction to review commonly prescribed medications for heart and lung disease. Reviews the medication, class of the drug, and side effects. Includes the steps to properly store meds and maintain the prescription regimen.  Written material given at graduation.   Other: -Provides group and verbal instruction on various topics (see comments)   Knowledge Questionnaire Score:  Knowledge Questionnaire Score - 07/14/21 1227       Knowledge Questionnaire Score   Pre Score 15/18              Core Components/Risk Factors/Patient Goals at Admission:  Personal Goals and Risk Factors at Admission - 07/14/21 1309       Core Components/Risk Factors/Patient Goals on Admission    Weight Management Yes;Weight Loss   Patient lost 25 pounds since Jan 2023, wants to gain muscle   Intervention Weight Management: Develop a combined nutrition and exercise program designed to reach desired caloric  intake, while maintaining appropriate intake of nutrient and fiber, sodium and fats, and appropriate energy expenditure required for the weight goal.;Weight Management: Provide education and appropriate resources to help participant work on and attain dietary goals.;Weight Management/Obesity: Establish reasonable short term and long term weight goals.    Admit Weight 209 lb (94.8 kg)   was 231 prior to his surgery in Jan 2023   Goal Weight: Short Term 204 lb (92.5 kg)    Goal Weight: Long Term 195 lb (88.5 kg)    Expected Outcomes Short Term: Continue to assess and modify interventions until short term weight is achieved;Weight Loss: Understanding of general recommendations for a balanced deficit meal plan, which promotes 1-2 lb weight loss per week and includes a negative energy balance of [phone] kcal/d;Understanding recommendations for meals to include 15-35% energy as protein, 25-35% energy from fat, 35-60% energy from carbohydrates, less than 240m of dietary cholesterol, 20-35 gm of total fiber daily;Understanding of distribution of calorie intake throughout the day with the consumption of 4-5 meals/snacks;Long Term: Adherence to nutrition and physical activity/exercise program aimed toward attainment of established weight goal    Improve shortness of breath with ADL's Yes    Intervention Provide education, individualized exercise plan and daily activity instruction to help decrease symptoms of SOB with activities of daily living.    Expected Outcomes Short Term: Improve cardiorespiratory fitness to achieve a reduction of symptoms when performing ADLs    Increase knowledge of respiratory medications and ability to use respiratory devices properly  Yes    Intervention Provide education and demonstration as needed of appropriate use of medications, inhalers, and oxygen therapy.    Expected Outcomes Short Term: Achieves understanding of medications use. Understands that oxygen is a medication  prescribed by physician. Demonstrates appropriate use of inhaler and oxygen therapy.;Long Term: Maintain appropriate use of medications, inhalers, and oxygen therapy.    Hypertension Yes    Intervention Provide education on lifestyle modifcations including regular physical activity/exercise, weight management, moderate sodium restriction and increased consumption of fresh fruit, vegetables, and low fat dairy, alcohol moderation, and smoking cessation.;Monitor prescription use compliance.    Expected Outcomes Short  Term: Continued assessment and intervention until BP is < 140/21m HG in hypertensive participants. < 130/838mHG in hypertensive participants with diabetes, heart failure or chronic kidney disease.;Long Term: Maintenance of blood pressure at goal levels.    Lipids Yes    Intervention Provide education and support for participant on nutrition & aerobic/resistive exercise along with prescribed medications to achieve LDL <7056mHDL >32m73m  Expected Outcomes Short Term: Participant states understanding of desired cholesterol values and is compliant with medications prescribed. Participant is following exercise prescription and nutrition guidelines.;Long Term: Cholesterol controlled with medications as prescribed, with individualized exercise RX and with personalized nutrition plan. Value goals: LDL < 70mg74mL > 40 mg.             Education:Diabetes - Individual verbal and written instruction to review signs/symptoms of diabetes, desired ranges of glucose level fasting, after meals and with exercise. Acknowledge that pre and post exercise glucose checks will be done for 3 sessions at entry of program.   Know Your Numbers and Heart Failure: - Group verbal and visual instruction to discuss disease risk factors for cardiac and pulmonary disease and treatment options.  Reviews associated critical values for Overweight/Obesity, Hypertension, Cholesterol, and Diabetes.  Discusses basics of  heart failure: signs/symptoms and treatments.  Introduces Heart Failure Zone chart for action plan for heart failure.  Written material given at graduation.   Core Components/Risk Factors/Patient Goals Review:   Goals and Risk Factor Review     Row Name 07/22/21 1545 08/23/21 1603           Core Components/Risk Factors/Patient Goals Review   Personal Goals Review Improve shortness of breath with ADL's Improve shortness of breath with ADL's      Review Spoke to patient about their shortness of breath and what they can do to improve. Patient has been informed of breathing techniques when starting the program. Patient is informed to tell staff if they have had any med changes and that certain meds they are taking or not taking can be causing shortness of breath. Bill reports his shortness of breath has been improving since starting rehab. He checks his O2 all the time and keeps a pulse oximeter on him - he makes sure his O2 does not dip below 88. He practices PLB when he gets short of breath. He also practices diaphragmatic breathing laying down. His weight was elevated 6lbs since 2 weeks ago - he went on vacation and ate out a lot. He is taking all of his medications as prescribed. Only issue with Esbriet which is costly and is giving him difficulty with his memory. Encouraged him to make MD aware if he hasn't already.      Expected Outcomes Short: Attend [facility] regularly to improve shortness of breath with ADL's. Long: maintain independence with ADL's Short: Attend [facility] regularly to improve shortness of breath with ADL's. Long: maintain independence with ADL's               Core Components/Risk Factors/Patient Goals at Discharge (Final Review):   Goals and Risk Factor Review - 08/23/21 1603       Core Components/Risk Factors/Patient Goals Review   Personal Goals Review Improve shortness of breath with ADL's    Review Bill reports his shortness of breath has been improving since  starting rehab. He checks his O2 all the time and keeps a pulse oximeter on him - he makes sure his O2 does not dip below 88. He practices PLB when  he gets short of breath. He also practices diaphragmatic breathing laying down. His weight was elevated 6lbs since 2 weeks ago - he went on vacation and ate out a lot. He is taking all of his medications as prescribed. Only issue with Esbriet which is costly and is giving him difficulty with his memory. Encouraged him to make MD aware if he hasn't already.    Expected Outcomes Short: Attend [facility] regularly to improve shortness of breath with ADL's. Long: maintain independence with ADL's             ITP Comments:  ITP Comments     Row Name 07/05/21 1155 07/14/21 1225 07/19/21 1532 08/11/21 1133 09/08/21 0755   ITP Comments Virtual orientation call completed today. he has an appointment on Date: 05/17/20253  for EP eval and gym Orientation.  Documentation of diagnosis can be found in [facility]  Date: 06/17/2021 .      Nazareth is a former tobacco user. Intervention for tobacco cessation was provided at the initial medical review. He was asked about  quitting and reported he quit all in Nov 2022 . Staff will continue to provide encouragement and follow up with the patient throughout the program to prevent relapse. Completed 6MWT and gym orientation. Initial ITP created and sent for review to Dr. Ottie Glazier, Medical Director. First full day of exercise!  Patient was oriented to gym and equipment including functions, settings, policies, and procedures.  Patient's individual exercise prescription and treatment plan were reviewed.  All starting workloads were established based on the results of the 6 minute walk test done at initial orientation visit.  The plan for exercise progression was also introduced and progression will be customized based on patient's performance and goals. 30 Day review completed. Medical Director ITP review done, changes made as directed,  and signed approval by Medical Director. 30 Day review completed. Medical Director ITP review done, changes made as directed, and signed approval by Medical Director.            Comments:

## 2021-09-09 DIAGNOSIS — J849 Interstitial pulmonary disease, unspecified: Secondary | ICD-10-CM | POA: Diagnosis not present

## 2021-09-09 NOTE — Progress Notes (Signed)
Daily Session Note  Patient Details  Name: Richard Parrish MRN: [id number] Date of Birth: 10-31-1950 Referring Provider:   Flowsheet Row Pulmonary Rehab from 07/14/2021 in [facility] Cardiac and Pulmonary Rehab  Referring Provider Marshell Garfinkel MD       Encounter Date: 09/09/2021  Check In:  Session Check In - 09/09/21 1543       Check-In   Supervising physician immediately available to respond to emergencies See telemetry face sheet for immediately available ER MD    Location [facility]-Cardiac & Pulmonary Rehab    Staff Present Vida Rigger, RN, BSN;Joseph Llano, RCP,RRT,BSRT;Melissa Lisbon, RDN, LDN;Jessica [place], [place], RCEP, CCRP, CCET    Virtual Visit No    Medication changes reported     No    Fall or balance concerns reported    No    Tobacco Cessation No Change    Warm-up and Cool-down Performed on first and last piece of equipment    Resistance Training Performed Yes    VAD Patient? No    PAD/SET Patient? No      Pain Assessment   Currently in Pain? No/denies                Social History   Tobacco Use  Smoking Status Former   Packs/day: 2.00   Years: 15.00   Total pack years: 30.00   Types: Cigarettes   Quit date: 03/01/1987   Years since quitting: 34.5  Smokeless Tobacco Never  Tobacco Comments   03/02/21-Pt instructed to not vape or drink alcohol for 24 hours prior to surgery.     Goals Met:  Proper associated with RPD/PD & O2 Sat Independence with exercise equipment Using PLB without cueing & demonstrates good technique Exercise tolerated well No report of concerns or symptoms today Strength training completed today  Goals Unmet:  Not Applicable  Comments: Pt able to follow exercise prescription today without complaint.  Will continue to monitor for progression.  Reviewed home exercise with pt today.  Pt plans to continue walking at home for exercise.  He has been walking with work, but not consistent enough to count as exercise, so we talked  about adding his time together at once.  Reviewed THR, pulse, RPE, sign and symptoms, pulse oximetery and when to call 911 or MD.  Also discussed weather considerations and indoor options.  Pt voiced understanding.   Dr. Emily Filbert is Medical Director for [place].  Dr. Ottie Glazier is Medical Director for [facility] Pulmonary Rehabilitation.

## 2021-09-11 ENCOUNTER — Other Ambulatory Visit: Payer: Self-pay | Admitting: Pulmonary Disease

## 2021-09-13 ENCOUNTER — Other Ambulatory Visit (HOSPITAL_COMMUNITY): Payer: Self-pay

## 2021-09-13 ENCOUNTER — Encounter: Payer: PPO | Admitting: *Deleted

## 2021-09-13 DIAGNOSIS — J849 Interstitial pulmonary disease, unspecified: Secondary | ICD-10-CM | POA: Diagnosis not present

## 2021-09-13 NOTE — Progress Notes (Signed)
Daily Session Note  Patient Details  Name: Richard Parrish MRN: [id number] Date of Birth: Jul 10, 1950 Referring Provider:   Flowsheet Row Pulmonary Rehab from 07/14/2021 in [facility] Cardiac and Pulmonary Rehab  Referring Provider Marshell Garfinkel MD       Encounter Date: 09/13/2021  Check In:  Session Check In - 09/13/21 1534       Check-In   Supervising physician immediately available to respond to emergencies See telemetry face sheet for immediately available ER MD    Location [facility]-Cardiac & Pulmonary Rehab    Staff Present Nyoka Cowden, RN, BSN, Ardeth Sportsman, RDN, Tawanna Solo, MS, ASCM CEP, Exercise Physiologist    Virtual Visit No    Medication changes reported     No    Fall or balance concerns reported    No    Tobacco Cessation No Change    Warm-up and Cool-down Performed on first and last piece of equipment    Resistance Training Performed Yes    VAD Patient? No    PAD/SET Patient? No      Pain Assessment   Currently in Pain? No/denies                Social History   Tobacco Use  Smoking Status Former   Packs/day: 2.00   Years: 15.00   Total pack years: 30.00   Types: Cigarettes   Quit date: 03/01/1987   Years since quitting: 34.5  Smokeless Tobacco Never  Tobacco Comments   03/02/21-Pt instructed to not vape or drink alcohol for 24 hours prior to surgery.     Goals Met:  Independence with exercise equipment Exercise tolerated well No report of concerns or symptoms today  Goals Unmet:  Not Applicable  Comments: Pt able to follow exercise prescription today without complaint.  Will continue to monitor for progression.    Dr. Emily Filbert is Medical Director for [place].  Dr. Ottie Glazier is Medical Director for [facility] Pulmonary Rehabilitation.

## 2021-09-15 ENCOUNTER — Other Ambulatory Visit (INDEPENDENT_AMBULATORY_CARE_PROVIDER_SITE_OTHER): Payer: PPO

## 2021-09-15 DIAGNOSIS — J849 Interstitial pulmonary disease, unspecified: Secondary | ICD-10-CM | POA: Diagnosis not present

## 2021-09-15 DIAGNOSIS — Z5181 Encounter for therapeutic drug level monitoring: Secondary | ICD-10-CM | POA: Diagnosis not present

## 2021-09-15 LAB — COMPREHENSIVE METABOLIC PANEL
ALT: 21 U/L (ref 0–53)
AST: 24 U/L (ref 0–37)
Albumin: 4.4 g/dL (ref 3.5–5.2)
Alkaline Phosphatase: 55 U/L (ref 39–117)
BUN: 23 mg/dL (ref 6–23)
CO2: 26 mEq/L (ref 19–32)
Calcium: 9.3 mg/dL (ref 8.4–10.5)
Chloride: 102 mEq/L (ref 96–112)
Creatinine, Ser: 0.9 mg/dL (ref 0.40–1.50)
GFR: 85.37 mL/min (ref >60.00)
Glucose, Bld: 128 mg/dL — ABNORMAL HIGH (ref 70–99)
Potassium: 3.8 mEq/L (ref 3.5–5.1)
Sodium: 139 mEq/L (ref 135–145)
Total Bilirubin: 0.5 mg/dL (ref 0.2–1.2)
Total Protein: 6.7 g/dL (ref 6.0–8.3)

## 2021-09-16 ENCOUNTER — Encounter: Payer: PPO | Admitting: *Deleted

## 2021-09-16 DIAGNOSIS — J849 Interstitial pulmonary disease, unspecified: Secondary | ICD-10-CM

## 2021-09-16 NOTE — Progress Notes (Signed)
Daily Session Note  Patient Details  Name: Richard Parrish MRN: [id number] Date of Birth: 1950/11/11 Referring Provider:   Flowsheet Row Pulmonary Rehab from 07/14/2021 in [facility] Cardiac and Pulmonary Rehab  Referring Provider Marshell Garfinkel MD       Encounter Date: 09/16/2021  Check In:  Session Check In - 09/16/21 1603       Check-In   Supervising physician immediately available to respond to emergencies See telemetry face sheet for immediately available ER MD    Location [facility]-Cardiac & Pulmonary Rehab    Staff Present Hope Budds, RDN, LDN;Jessica Luan Pulling, MA, RCEP, CCRP, CCET;Shahrzad Koble Sherryll Burger, RN BSN    Virtual Visit No    Medication changes reported     No    Fall or balance concerns reported    No    Warm-up and Cool-down Performed on first and last piece of equipment    Resistance Training Performed Yes    VAD Patient? No    PAD/SET Patient? No      Pain Assessment   Currently in Pain? No/denies                Social History   Tobacco Use  Smoking Status Former   Packs/day: 2.00   Years: 15.00   Total pack years: 30.00   Types: Cigarettes   Quit date: 03/01/1987   Years since quitting: 34.5  Smokeless Tobacco Never  Tobacco Comments   03/02/21-Pt instructed to not vape or drink alcohol for 24 hours prior to surgery.     Goals Met:  Independence with exercise equipment Exercise tolerated well No report of concerns or symptoms today Strength training completed today  Goals Unmet:  Not Applicable  Comments: Pt able to follow exercise prescription today without complaint.  Will continue to monitor for progression.    Dr. Emily Filbert is Medical Director for [place].  Dr. Ottie Glazier is Medical Director for [facility] Pulmonary Rehabilitation.

## 2021-09-16 NOTE — Progress Notes (Signed)
HISTORY AND PHYSICAL     CC:  follow up. Requesting Provider:  Seward Carol, MD  HPI: This is a 71 y.o. male who is here today for follow up for PAD.  Pt has hx of atherectomy and stent placement of his left superficial femoral and popliteal artery for left leg claudication in 2017 by Dr.Brabham.  He experienced immediate symptomatic relief.  Pt was last seen 08/10/2020 and at that time, he was doing well without claudication or non healing wounds.    The pt returns today for follow up.  He denies any claudication, rest pain or non healing wounds.  He recently underwent robotic lung surgery for cancer and is doing well.  He is now on oxygen at home 100% of the time.   The pt is on a statin for cholesterol management.    The pt is not on an aspirin.    Other AC:  Eliquis/Plavix The pt is on BB for hypertension.  The pt does not have diabetes. Tobacco hx:  former  Pt does not have family hx of AAA.  Past Medical History:  Diagnosis Date   Anxiety    Arthritis    "lower back" (04/07/2017)   BPH (benign prostatic hypertrophy)    C. difficile diarrhea    Chronic lower back pain    Coronary artery disease 1989   cabg with dvg to rca    Depression    GERD (gastroesophageal reflux disease)    Hyperlipidemia    Hypertension    Insomnia    Myocardial infarction ([place]) 2002   OSA on CPAP    cpap setting of 60 per pt   Osteoarthritis    "hips" (04/07/2017)   PAD (peripheral artery disease) ([place])    Peripheral vascular disease ([place])    Pneumonia 1989   S/P CABG   Pulmonary embolism ([place]) 05/18/2021   Pulmonary fibrosis ([place])    "one time" (04/07/2017)   Spinal stenosis     Past Surgical History:  Procedure Laterality Date   ABDOMINAL AORTAGRAM N/A 03/15/2011   Procedure: ABDOMINAL Maxcine Ham;  Surgeon: Serafina Mitchell, MD;  Location: [facility] CATH LAB;  Service: Cardiovascular;  Laterality: N/A;   ABDOMINAL AORTAGRAM N/A 04/12/2011   Procedure: ABDOMINAL Maxcine Ham;  Surgeon: Serafina Mitchell, MD;  Location: [facility] CATH LAB;  Service: Cardiovascular;  Laterality: N/A;   BACK SURGERY     BRONCHIAL BIOPSY  03/03/2021   Procedure: BRONCHIAL BIOPSIES;  Surgeon: Garner Nash, DO;  Location: [facility] ENDOSCOPY;  Service: Pulmonary;;   BRONCHIAL BRUSHINGS  03/03/2021   Procedure: BRONCHIAL BRUSHINGS;  Surgeon: Garner Nash, DO;  Location: [place] ENDOSCOPY;  Service: Pulmonary;;   BRONCHIAL NEEDLE ASPIRATION BIOPSY  03/03/2021   Procedure: BRONCHIAL NEEDLE ASPIRATION BIOPSIES;  Surgeon: Garner Nash, DO;  Location: [facility] [place] ENDOSCOPY;  Service: Pulmonary;;   CARDIAC CATHETERIZATION  08/1987   CORONARY ANGIOPLASTY WITH STENT PLACEMENT  ~ 2002   [facility], [place]   CORONARY ARTERY BYPASS GRAFT  09/1987   "CABG X1";  [facility]; [place]; "RCA"   FIDUCIAL MARKER PLACEMENT  03/03/2021   Procedure: FIDUCIAL DYE MARKING;  Surgeon: Garner Nash, DO;  Location: [place];  Service: Pulmonary;;   INTERCOSTAL NERVE BLOCK  03/03/2021   Procedure: INTERCOSTAL NERVE BLOCK;  Surgeon: Lajuana Matte, MD;  Location: [place];  Service: Thoracic;;   JOINT REPLACEMENT     KNEE ARTHROSCOPY Left    LAPAROSCOPIC CHOLECYSTECTOMY     LYMPH NODE BIOPSY  03/03/2021   Procedure: LYMPH NODE BIOPSY;  Surgeon: Lajuana Matte, MD;  Location: [place];  Service: Thoracic;;   LYMPH NODE DISSECTION  03/03/2021   Procedure: LYMPH NODE DISSECTION;  Surgeon: Lajuana Matte, MD;  Location: [place];  Service: Thoracic;;   PERIPHERAL VASCULAR INTERVENTION Left 2016   "put a stent; right branch of left femoral"   POSTERIOR LUMBAR FUSION  2009   lumbar fusion, S L 5 to L 4   RIGHT/LEFT HEART CATH AND CORONARY/GRAFT ANGIOGRAPHY N/A 10/05/2020   Procedure: RIGHT/LEFT HEART CATH AND CORONARY/GRAFT ANGIOGRAPHY;  Surgeon: Nelva Bush, MD;  Location: [place] CV LAB;  Service: Cardiovascular;  Laterality: N/A;   SHOULDER ARTHROSCOPY W/ ROTATOR CUFF REPAIR Right 2006   SHOULDER ARTHROSCOPY WITH  SUBACROMIAL DECOMPRESSION AND OPEN ROTATOR C Left ~ [place]     TOTAL HIP ARTHROPLASTY Right 2012   TOTAL HIP ARTHROPLASTY Left 04/07/2017   TOTAL HIP ARTHROPLASTY Left 04/07/2017   Procedure: LEFT TOTAL HIP ARTHROPLASTY;  Surgeon: Earlie Server, MD;  Location: [place];  Service: Orthopedics;  Laterality: Left;   VIDEO BRONCHOSCOPY WITH RADIAL ENDOBRONCHIAL ULTRASOUND  03/03/2021   Procedure: VIDEO BRONCHOSCOPY WITH RADIAL ENDOBRONCHIAL ULTRASOUND;  Surgeon: Garner Nash, DO;  Location: [facility] ENDOSCOPY;  Service: Pulmonary;;    Allergies  Allergen Reactions   Ambien [Zolpidem Tartrate] Other (See Comments)    Bad dreams/Vivid Dreams    Current Outpatient Medications  Medication Sig Dispense Refill   acetaminophen (TYLENOL) 500 MG tablet Take 1 tablet (500 mg total) by mouth every 6 (six) hours as needed for mild pain.     ALPRAZolam (XANAX) 0.5 MG tablet Take 0.5-1 tablets (0.25-0.5 mg total) by mouth 3 (three) times daily as needed for anxiety or sleep. 5 tablet 0   antiseptic oral rinse (BIOTENE) LIQD 15 mLs by Mouth Rinse route daily as needed for dry mouth.     atorvastatin (LIPITOR) 80 MG tablet TAKE 1 TABLET BY MOUTH EVERY DAY (Patient taking differently: Take 80 mg by mouth daily.) 90 tablet 1   cholecalciferol (VITAMIN D3) 25 MCG (1000 UNIT) tablet Take 1,000 Units by mouth in the morning.     clopidogrel (PLAVIX) 75 MG tablet TAKE 1 TABLET BY MOUTH EVERY DAY (Patient taking differently: Take 75 mg by mouth daily.) 30 tablet 11   ELIQUIS 5 MG TABS tablet TAKE 1 TABLET BY MOUTH TWICE A DAY 180 tablet 1   gabapentin (NEURONTIN) 100 MG capsule Take 2 capsules (200 mg total) by mouth 3 (three) times daily. 90 capsule 1   guaiFENesin (MUCINEX) 600 MG 12 hr tablet Take 1 tablet (600 mg total) by mouth 2 (two) times daily as needed. (Patient taking differently: Take 600 mg by mouth 2 (two) times daily as needed for cough or to loosen phlegm.)      HYDROcodone-acetaminophen (NORCO) 10-325 MG tablet Take 1 tablet by mouth every 4 (four) hours as needed for moderate pain or severe pain. 10 tablet 0   hyoscyamine (LEVSIN, ANASPAZ) 0.125 MG tablet Take 0.125 mg by mouth every 4 (four) hours as needed for cramping.      metoprolol succinate (TOPROL-XL) 50 MG 24 hr tablet Take 0.5 tablets (25 mg total) by mouth daily. Take with or immediately following a meal.     nitroGLYCERIN (NITROSTAT) 0.4 MG SL tablet Place 0.4 mg under the tongue every 5 (five) minutes as needed for chest pain.     pantoprazole (PROTONIX) 40 MG tablet TAKE  1 TABLET BY MOUTH EVERY DAY BEFORE BREAKFAST 90 tablet 1   PARoxetine (PAXIL) 40 MG tablet Take 40 mg by mouth in the morning.  6   Pirfenidone 267 MG TABS Take 3 tablets (801 mg total) by mouth 3 (three) times daily with meals. 270 tablet 1   predniSONE (DELTASONE) 10 MG tablet Take 40mg  daily for 1 month then reduce 10mg  monthly 120 tablet 2   Probiotic Product (PROBIOTIC PO) Take 1 capsule by mouth daily.     sodium chloride (OCEAN) 0.65 % SOLN nasal spray Place 1 spray into both nostrils daily as needed for congestion.     sulfamethoxazole-trimethoprim (BACTRIM DS) 800-160 MG tablet Take 1 tablet by mouth 3 (three) times a week. 12 tablet 5   No current facility-administered medications for this visit.    Family History  Problem Relation Age of Onset   Heart disease Mother    Cancer Father     Social History   Socioeconomic History   Marital status: Married    Spouse name: Not on file   Number of children: Not on file   Years of education: Not on file   Highest education level: Not on file  Occupational History   Occupation: Retired  Tobacco Use   Smoking status: Former    Packs/day: 2.00    Years: 15.00    Total pack years: 30.00    Types: Cigarettes    Quit date: 03/01/1987    Years since quitting: 34.5   Smokeless tobacco: Never   Tobacco comments:    03/02/21-Pt instructed to not vape or drink  alcohol for 24 hours prior to surgery.   Vaping Use   Vaping Use: Every day   Last attempt to quit: 12/29/2020  Substance and Sexual Activity   Alcohol use: Yes    Comment: couple drinks/month   Drug use: No   Sexual activity: Not Currently  Other Topics Concern   Not on file  Social History Narrative   ** Merged History Encounter **       Social Determinants of Health   Financial Resource Strain: Not on file  Food Insecurity: Not on file  Transportation Needs: Not on file  Physical Activity: Not on file  Stress: Not on file  Social Connections: Not on file  Intimate Partner Violence: Not on file     REVIEW OF SYSTEMS:   [X]  denotes positive finding, [ ]  denotes negative finding Cardiac  Comments:  Chest pain or chest pressure:    Shortness of breath upon exertion: x   Short of breath when lying flat:    Irregular heart rhythm:        Vascular    Pain in calf, thigh, or hip brought on by ambulation:    Pain in feet at night that wakes you up from your sleep:     Blood clot in your veins:    Leg swelling:         Pulmonary    Oxygen at home: x   Productive cough:  x   Wheezing:         Neurologic    Sudden weakness in arms or legs:     Sudden numbness in arms or legs:     Sudden onset of difficulty speaking or slurred speech:    Temporary loss of vision in one eye:     Problems with dizziness:         Gastrointestinal    Blood in stool:  Vomited blood:         Genitourinary    Burning when urinating:     Blood in urine:        Psychiatric    Major depression:         Hematologic    Bleeding problems: x On blood thinners  Problems with blood clotting too easily:        Skin    Rashes or ulcers:        Constitutional    Fever or chills:      PHYSICAL EXAMINATION:  Today's Vitals   09/20/21 1449  BP: 126/81  Pulse: 84  Resp: 20  Temp: 97.6 F (36.4 C)  TempSrc: Temporal  SpO2: 95%  Weight: 214 lb (97.1 kg)  Height: 6' (1.829 m)    Body mass index is 29.02 kg/m.   General:  WDWN in NAD; vital signs documented above Gait: Not observed HENT: WNL, normocephalic Pulmonary: normal non-labored breathing , without wheezing Cardiac: regular HR, without carotid bruits Abdomen: soft, NT, no masses; aortic pulse is not palpable Skin: without rashes Vascular Exam/Pulses:  Right Left  Radial 2+ (normal) 2+ (normal)  DP 2+ (normal) 2+ (normal)  PT Unable to palpate 2+ (normal)   Extremities: without ischemic changes, without Gangrene , without cellulitis; without open wounds Musculoskeletal: no muscle wasting or atrophy  Neurologic: A&O X 3 Psychiatric:  The pt has Normal affect.   Non-Invasive Vascular Imaging:   ABI's/TBI's on 09/20/2021: Right:  1.02/0.82 - Great toe pressure: 115 Left:  1.11/0.92 - Great toe pressure: 129  Arterial duplex on 09/19/2021: +----------+--------+-----+---------------+---------+--------+  LEFT      PSV cm/sRatioStenosis       Waveform Comments  +----------+--------+-----+---------------+---------+--------+  CFA Prox  136                         triphasic          +----------+--------+-----+---------------+---------+--------+  CFA Distal129                         triphasic          +----------+--------+-----+---------------+---------+--------+  DFA       334          50-74% stenosisbiphasic           +----------+--------+-----+---------------+---------+--------+  SFA Prox  109                         triphasic          +----------+--------+-----+---------------+---------+--------+  POP Mid   61                          triphasic          +----------+--------+-----+---------------+---------+--------+    Left Stent(s):  +---------------+--------+--------+---------+--------+  SFA            PSV cm/sStenosisWaveform Comments  +---------------+--------+--------+---------+--------+  Prox to Stent  58              biphasic            +---------------+--------+--------+---------+--------+  Proximal Stent 72              triphasic          +---------------+--------+--------+---------+--------+  Mid Stent      129             triphasic          +---------------+--------+--------+---------+--------+  Distal Stent   108             triphasic          +---------------+--------+--------+---------+--------+  Distal to Stent104             triphasic          +---------------+--------+--------+---------+--------+  Summary:  Left: Patent stent with no evidence of stenosis in the superficial femoral artery artery. Elevated velocities in the deep femoral artery suggesting 50-74% stenosis.   Previous ABI's/TBI's on 08/10/2020: Right:  1.03/0.93 - Great toe pressure: 126 Left:  1.04/0.93 - Great toe pressure:  126  Previous arterial duplex on 08/10/2020: Summary:  Left: Patent stent with no evidence of stenosis in the Mid left SFA artery. Calcified plaque seen in the common femoral artery without significant stenosis.     ASSESSMENT/PLAN:: 71 y.o. male here for follow up for PAD with hx of atherectomy and stent placement of his left superficial femoral and popliteal artery for left leg claudication in 2017 by Dr.Brabham.   -pt doing well with palpable pedal pulses bilaterally -continue statin/plavix and on Eliquis for hx of DVT/PE -pt will f/u in one year with LLE arterial duplex and ABI.  He will call sooner if he has any issues before then   Leontine Locket, [facility] [phone]  Clinic MD:   Carlis Abbott on call MD

## 2021-09-17 ENCOUNTER — Inpatient Hospital Stay: Payer: PPO | Attending: Internal Medicine

## 2021-09-17 ENCOUNTER — Ambulatory Visit (HOSPITAL_COMMUNITY)
Admission: RE | Admit: 2021-09-17 | Discharge: 2021-09-17 | Disposition: A | Discharge status: Home / Self Care | Payer: PPO | Source: Ambulatory Visit | Attending: Internal Medicine | Admitting: Internal Medicine

## 2021-09-17 ENCOUNTER — Other Ambulatory Visit: Payer: Self-pay | Admitting: Interventional Cardiology

## 2021-09-17 ENCOUNTER — Other Ambulatory Visit: Payer: Self-pay

## 2021-09-17 DIAGNOSIS — I70212 Atherosclerosis of native arteries of extremities with intermittent claudication, left leg: Secondary | ICD-10-CM | POA: Insufficient documentation

## 2021-09-17 DIAGNOSIS — C3490 Malignant neoplasm of unspecified part of unspecified bronchus or lung: Secondary | ICD-10-CM

## 2021-09-17 DIAGNOSIS — C3411 Malignant neoplasm of upper lobe, right bronchus or lung: Secondary | ICD-10-CM | POA: Insufficient documentation

## 2021-09-17 DIAGNOSIS — I739 Peripheral vascular disease, unspecified: Secondary | ICD-10-CM | POA: Insufficient documentation

## 2021-09-17 DIAGNOSIS — C3431 Malignant neoplasm of lower lobe, right bronchus or lung: Secondary | ICD-10-CM | POA: Insufficient documentation

## 2021-09-17 DIAGNOSIS — Z87891 Personal history of nicotine dependence: Secondary | ICD-10-CM | POA: Insufficient documentation

## 2021-09-17 DIAGNOSIS — Z951 Presence of aortocoronary bypass graft: Secondary | ICD-10-CM | POA: Insufficient documentation

## 2021-09-17 DIAGNOSIS — J8410 Pulmonary fibrosis, unspecified: Secondary | ICD-10-CM | POA: Insufficient documentation

## 2021-09-17 LAB — CBC WITH DIFFERENTIAL (CANCER CENTER ONLY)
Abs Immature Granulocytes: 0.05 10*3/uL (ref 0.00–0.07)
Basophils Absolute: 0 10*3/uL (ref 0.0–0.1)
Basophils Relative: 0 %
Eosinophils Absolute: 0 10*3/uL (ref 0.0–0.5)
Eosinophils Relative: 0 %
HCT: 41 % (ref 39.0–52.0)
Hemoglobin: 14.2 g/dL (ref 13.0–17.0)
Immature Granulocytes: 0 %
Lymphocytes Relative: 3 %
Lymphs Abs: 0.4 10*3/uL — ABNORMAL LOW (ref 0.7–4.0)
MCH: 33.3 pg (ref 26.0–34.0)
MCHC: 34.6 g/dL (ref 30.0–36.0)
MCV: 96 fL (ref 80.0–100.0)
Monocytes Absolute: 0.3 10*3/uL (ref 0.1–1.0)
Monocytes Relative: 3 %
Neutro Abs: 10.7 10*3/uL — ABNORMAL HIGH (ref 1.7–7.7)
Neutrophils Relative %: 94 %
Platelet Count: 369 10*3/uL (ref 150–400)
RBC: 4.27 MIL/uL (ref 4.22–5.81)
RDW: 14.4 % (ref 11.5–15.5)
WBC Count: 11.5 10*3/uL — ABNORMAL HIGH (ref 4.0–10.5)
nRBC: 0 % (ref 0.0–0.2)

## 2021-09-17 LAB — CMP (CANCER CENTER ONLY)
ALT: 21 U/L (ref 0–44)
AST: 22 U/L (ref 15–41)
Albumin: 4.2 g/dL (ref 3.5–5.0)
Alkaline Phosphatase: 49 U/L (ref 38–126)
Anion gap: 6 (ref 5–15)
BUN: 23 mg/dL (ref 8–23)
CO2: 29 mmol/L (ref 22–32)
Calcium: 9.7 mg/dL (ref 8.9–10.3)
Chloride: 103 mmol/L (ref 98–111)
Creatinine: 0.76 mg/dL (ref 0.61–1.24)
GFR, Estimated: >60 mL/min (ref >60)
Glucose, Bld: 117 mg/dL — ABNORMAL HIGH (ref 70–99)
Potassium: 4.5 mmol/L (ref 3.5–5.1)
Sodium: 138 mmol/L (ref 135–145)
Total Bilirubin: 0.8 mg/dL (ref 0.3–1.2)
Total Protein: 6.5 g/dL (ref 6.5–8.1)

## 2021-09-17 MED ORDER — SODIUM CHLORIDE (PF) 0.9 % IJ SOLN
INTRAMUSCULAR | Status: AC
  Filled 2021-09-17: qty 50

## 2021-09-17 MED ORDER — IOHEXOL 300 MG/ML  SOLN
75.0000 mL | Freq: Once | INTRAMUSCULAR | Status: AC | PRN
  Administered 2021-09-17: 75 mL via INTRAVENOUS

## 2021-09-20 ENCOUNTER — Encounter: Payer: Self-pay | Admitting: Internal Medicine

## 2021-09-20 ENCOUNTER — Other Ambulatory Visit (HOSPITAL_COMMUNITY): Payer: Self-pay

## 2021-09-20 ENCOUNTER — Ambulatory Visit (INDEPENDENT_AMBULATORY_CARE_PROVIDER_SITE_OTHER)
Admission: RE | Admit: 2021-09-20 | Discharge: 2021-09-20 | Disposition: A | Discharge status: Home / Self Care | Payer: PPO | Source: Ambulatory Visit | Attending: Surgery | Admitting: Surgery

## 2021-09-20 ENCOUNTER — Other Ambulatory Visit: Payer: Self-pay

## 2021-09-20 ENCOUNTER — Inpatient Hospital Stay (HOSPITAL_BASED_OUTPATIENT_CLINIC_OR_DEPARTMENT_OTHER): Payer: PPO | Admitting: Internal Medicine

## 2021-09-20 ENCOUNTER — Ambulatory Visit (INDEPENDENT_AMBULATORY_CARE_PROVIDER_SITE_OTHER): Payer: PPO | Admitting: Physician Assistant

## 2021-09-20 ENCOUNTER — Encounter: Payer: Self-pay | Admitting: Physician Assistant

## 2021-09-20 VITALS — BP 124/76 | HR 78 | Temp 97.9°F | Resp 21 | Ht 72.0 in | Wt 216.1 lb

## 2021-09-20 VITALS — BP 126/81 | HR 84 | Temp 97.6°F | Resp 20 | Ht 72.0 in | Wt 214.0 lb

## 2021-09-20 DIAGNOSIS — J8410 Pulmonary fibrosis, unspecified: Secondary | ICD-10-CM | POA: Diagnosis not present

## 2021-09-20 DIAGNOSIS — C3490 Malignant neoplasm of unspecified part of unspecified bronchus or lung: Secondary | ICD-10-CM | POA: Diagnosis not present

## 2021-09-20 DIAGNOSIS — C3411 Malignant neoplasm of upper lobe, right bronchus or lung: Secondary | ICD-10-CM

## 2021-09-20 DIAGNOSIS — C3431 Malignant neoplasm of lower lobe, right bronchus or lung: Secondary | ICD-10-CM | POA: Diagnosis present

## 2021-09-20 DIAGNOSIS — I70212 Atherosclerosis of native arteries of extremities with intermittent claudication, left leg: Secondary | ICD-10-CM | POA: Insufficient documentation

## 2021-09-20 DIAGNOSIS — Z951 Presence of aortocoronary bypass graft: Secondary | ICD-10-CM | POA: Diagnosis not present

## 2021-09-20 DIAGNOSIS — I739 Peripheral vascular disease, unspecified: Secondary | ICD-10-CM | POA: Insufficient documentation

## 2021-09-20 DIAGNOSIS — Z87891 Personal history of nicotine dependence: Secondary | ICD-10-CM | POA: Diagnosis not present

## 2021-09-20 NOTE — Progress Notes (Signed)
[place] [phone]   [phone]  OFFICE PROGRESS NOTE  Seward Carol, MD 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 Kipp Brood on March 03, 2021.  CURRENT THERAPY: Observation.  INTERVAL HISTORY: Richard Parrish 71 y.o. male returns to the clinic today for follow-up visit.  The patient is feeling fine today with no concerning complaints except for the baseline shortness of breath and he is currently on home oxygen.  He has a history of pulmonary fibrosis and followed by Dr. Vaughan Browner.  He denied having any current chest pain but has mild cough with no hemoptysis.  He has no nausea, vomiting, diarrhea or constipation.  He has no headache or visual changes.  He is here today for evaluation with repeat CT scan of the chest for restaging of his disease.  MEDICAL HISTORY: Past Medical History:  Diagnosis Date   Anxiety    Arthritis    "lower back" (04/07/2017)   BPH (benign prostatic hypertrophy)    C. difficile diarrhea    Chronic lower back pain    Coronary artery disease 1989   cabg with dvg to rca    Depression    GERD (gastroesophageal reflux disease)    Hyperlipidemia    Hypertension    Insomnia    Myocardial infarction ([place]) 2002   OSA on CPAP    cpap setting of 60 per pt   Osteoarthritis    "hips" (04/07/2017)   PAD (peripheral artery disease) ([place])    Peripheral vascular disease ([place])    Pneumonia 1989   S/P CABG   Pulmonary embolism ([place]) 05/18/2021   Pulmonary fibrosis ([place])    "one time" (04/07/2017)   Spinal stenosis     ALLERGIES:  is allergic to [employer] tartrate].  MEDICATIONS:  Current Outpatient  Medications  Medication Sig Dispense Refill   acetaminophen (TYLENOL) 500 MG tablet Take 1 tablet (500 mg total) by mouth every 6 (six) hours as needed for mild pain.     ALPRAZolam (XANAX) 0.5 MG tablet Take 0.5-1 tablets (0.25-0.5 mg total) by mouth 3 (three) times daily as needed for anxiety or sleep. 5 tablet 0   antiseptic oral rinse (BIOTENE) LIQD 15 mLs by Mouth Rinse route daily as needed for dry mouth.     atorvastatin (LIPITOR) 80 MG tablet TAKE 1 TABLET BY MOUTH EVERY DAY 90 tablet 0   cholecalciferol (VITAMIN D3) 25 MCG (1000 UNIT) tablet Take 1,000 Units by mouth in the morning.     clopidogrel (PLAVIX) 75 MG tablet TAKE 1 TABLET BY MOUTH EVERY DAY (Patient taking differently: Take 75 mg by mouth daily.) 30 tablet 11   ELIQUIS 5 MG TABS tablet TAKE 1 TABLET BY MOUTH TWICE A DAY 180 tablet 1   gabapentin (NEURONTIN) 100 MG capsule Take 2 capsules (200 mg total) by mouth 3 (three) times daily. 90 capsule 1   guaiFENesin (MUCINEX) 600 MG 12 hr tablet Take 1 tablet (600 mg total) by mouth 2 (two) times daily as needed. (Patient taking differently: Take 600 mg by mouth 2 (two) times daily as needed for cough or to loosen phlegm.)     HYDROcodone-acetaminophen (NORCO) 10-325 MG  tablet Take 1 tablet by mouth every 4 (four) hours as needed for moderate pain or severe pain. 10 tablet 0   hyoscyamine (LEVSIN, ANASPAZ) 0.125 MG tablet Take 0.125 mg by mouth every 4 (four) hours as needed for cramping.      metoprolol succinate (TOPROL-XL) 50 MG 24 hr tablet Take 0.5 tablets (25 mg total) by mouth daily. Take with or immediately following a meal.     nitroGLYCERIN (NITROSTAT) 0.4 MG SL tablet Place 0.4 mg under the tongue every 5 (five) minutes as needed for chest pain.     pantoprazole (PROTONIX) 40 MG tablet TAKE 1 TABLET BY MOUTH EVERY DAY BEFORE BREAKFAST 90 tablet 1   PARoxetine (PAXIL) 40 MG tablet Take 40 mg by mouth in the morning.  6   Pirfenidone 267 MG TABS Take 3 tablets (801 mg total)  by mouth 3 (three) times daily with meals. 270 tablet 1   predniSONE (DELTASONE) 10 MG tablet Take 40mg  daily for 1 month then reduce 10mg  monthly 120 tablet 2   Probiotic Product (PROBIOTIC PO) Take 1 capsule by mouth daily.     sodium chloride (OCEAN) 0.65 % SOLN nasal spray Place 1 spray into both nostrils daily as needed for congestion.     sulfamethoxazole-trimethoprim (BACTRIM DS) 800-160 MG tablet Take 1 tablet by mouth 3 (three) times a week. 12 tablet 5   No current facility-administered medications for this visit.    SURGICAL HISTORY:  Past Surgical History:  Procedure Laterality Date   ABDOMINAL AORTAGRAM N/A 03/15/2011   Procedure: ABDOMINAL AORTAGRAM;  Surgeon: Serafina Mitchell, MD;  Location: [facility] CATH LAB;  Service: Cardiovascular;  Laterality: N/A;   ABDOMINAL AORTAGRAM N/A 04/12/2011   Procedure: ABDOMINAL Maxcine Ham;  Surgeon: Serafina Mitchell, MD;  [facility];  Service: Cardiovascular;  Laterality: N/A;   BACK SURGERY     BRONCHIAL BIOPSY  03/03/2021   Procedure: BRONCHIAL BIOPSIES;  Surgeon: Garner Nash, DO;  Location: [facility] ENDOSCOPY;  Service: Pulmonary;;   BRONCHIAL BRUSHINGS  03/03/2021   Procedure: BRONCHIAL BRUSHINGS;  Surgeon: Garner Nash, DO;  Location: [facility];  Service: Pulmonary;;   BRONCHIAL NEEDLE ASPIRATION BIOPSY  03/03/2021   Procedure: BRONCHIAL NEEDLE ASPIRATION BIOPSIES;  Surgeon: Garner Nash, DO;  Location: [facility];  Service: Pulmonary;;   CARDIAC CATHETERIZATION  08/1987   CORONARY ANGIOPLASTY WITH STENT PLACEMENT  ~ 2002   [facility], [place]   CORONARY ARTERY BYPASS GRAFT  09/1987   "CABG X1";  [facility]; [place]; "RCA"   FIDUCIAL MARKER PLACEMENT  03/03/2021   Procedure: FIDUCIAL DYE MARKING;  Surgeon: Garner Nash, DO;  Location: [facility];  Service: Pulmonary;;   INTERCOSTAL NERVE BLOCK  03/03/2021   Procedure: INTERCOSTAL NERVE BLOCK;  Surgeon: Lajuana Matte, MD;  Location: [facility];  Service:  Thoracic;;   JOINT REPLACEMENT     KNEE ARTHROSCOPY Left    LAPAROSCOPIC CHOLECYSTECTOMY     LYMPH NODE BIOPSY  03/03/2021   Procedure: LYMPH NODE BIOPSY;  Surgeon: Lajuana Matte, MD;  Location: [facility];  Service: Thoracic;;   LYMPH NODE DISSECTION  03/03/2021   Procedure: LYMPH NODE DISSECTION;  Surgeon: Lajuana Matte, MD;  Location: [facility];  Service: Thoracic;;   PERIPHERAL VASCULAR INTERVENTION Left 2016   "put a stent; right branch of left femoral"   POSTERIOR LUMBAR FUSION  2009   lumbar fusion, S L 5 to L 4   RIGHT/LEFT HEART CATH AND CORONARY/GRAFT  ANGIOGRAPHY N/A 10/05/2020   Procedure: RIGHT/LEFT HEART CATH AND CORONARY/GRAFT ANGIOGRAPHY;  Surgeon: Nelva Bush, MD;  Location: [facility] CV LAB;  Service: Cardiovascular;  Laterality: N/A;   SHOULDER ARTHROSCOPY W/ ROTATOR CUFF REPAIR Right 2006   SHOULDER ARTHROSCOPY WITH SUBACROMIAL DECOMPRESSION AND OPEN ROTATOR C Left ~ Regino Ramirez     TOTAL HIP ARTHROPLASTY Right 2012   TOTAL HIP ARTHROPLASTY Left 04/07/2017   TOTAL HIP ARTHROPLASTY Left 04/07/2017   Procedure: LEFT TOTAL HIP ARTHROPLASTY;  Surgeon: Earlie Server, MD;  Location: [facility];  Service: Orthopedics;  Laterality: Left;   VIDEO BRONCHOSCOPY WITH RADIAL ENDOBRONCHIAL ULTRASOUND  03/03/2021   Procedure: VIDEO BRONCHOSCOPY WITH RADIAL ENDOBRONCHIAL ULTRASOUND;  Surgeon: Garner Nash, DO;  Location: [facility];  Service: Pulmonary;;    REVIEW OF SYSTEMS:  A comprehensive review of systems was negative except for: Constitutional: positive for fatigue Respiratory: positive for cough and dyspnea on exertion   PHYSICAL EXAMINATION: General appearance: alert, cooperative, fatigued, and no distress Head: Normocephalic, without obvious abnormality, atraumatic Neck: no adenopathy, no JVD, supple, symmetrical, trachea midline, and thyroid not enlarged, symmetric, no tenderness/mass/nodules Lymph nodes: Cervical, supraclavicular, and  axillary nodes normal. Resp: clear to auscultation bilaterally Back: symmetric, no curvature. ROM normal. No CVA tenderness. Cardio: regular rate and rhythm, S1, S2 normal, no murmur, click, rub or gallop GI: soft, non-tender; bowel sounds normal; no masses,  no organomegaly Extremities: extremities normal, atraumatic, no cyanosis or edema  ECOG PERFORMANCE STATUS: 1 - Symptomatic but completely ambulatory  Blood pressure 124/76, pulse 78, temperature 97.9 F (36.6 C), temperature source Oral, resp. rate (!) 21, height 6' (1.829 m), weight 216 lb 1.6 oz (98 kg), SpO2 93 %.  LABORATORY DATA: Lab Results  Component Value Date   WBC 11.5 (H) 09/17/2021   HGB 14.2 09/17/2021   HCT 41.0 09/17/2021   MCV 96.0 09/17/2021   PLT 369 09/17/2021      Chemistry      Component Value Date/Time   NA 138 09/17/2021 1504   NA 140 02/19/2020 0820   K 4.5 09/17/2021 1504   CL 103 09/17/2021 1504   CO2 29 09/17/2021 1504   BUN 23 09/17/2021 1504   BUN 14 02/19/2020 0820   CREATININE 0.76 09/17/2021 1504      Component Value Date/Time   CALCIUM 9.7 09/17/2021 1504   ALKPHOS 49 09/17/2021 1504   AST 22 09/17/2021 1504   ALT 21 09/17/2021 1504   BILITOT 0.8 09/17/2021 1504       RADIOGRAPHIC STUDIES: CT Chest W Contrast  Result Date: 09/19/2021 CLINICAL DATA:  Non-small cell lung cancer staging status post right upper lobectomy, pulmonary fibrosis * Tracking Code: BO * EXAM: CT CHEST WITH CONTRAST TECHNIQUE: Multidetector CT imaging of the chest was performed during intravenous contrast administration. RADIATION DOSE REDUCTION: This exam was performed according to the departmental dose-optimization program which includes automated exposure control, adjustment of the mA and/or kV according to patient size and/or use of iterative reconstruction technique. CONTRAST:  28mL OMNIPAQUE IOHEXOL 300 MG/ML  SOLN COMPARISON:  07/12/2021 FINDINGS: Cardiovascular: Aortic atherosclerosis. Normal heart  size. Three-vessel coronary artery calcifications and/or stents. No pericardial effusion. Mediastinum/Nodes: No enlarged mediastinal, hilar, or axillary lymph nodes. Thyroid gland, trachea, and esophagus demonstrate no significant findings. Lungs/Pleura: Status post right upper lobectomy. Unchanged severe pulmonary fibrosis in a with basal gradient featuring irregular interstitial opacity, septal thickening, subpleural bronchiolectasis, large areas honeycombing at bilateral lung bases. No pleural effusion or pneumothorax.  Upper Abdomen: No acute abnormality. Musculoskeletal: No chest wall abnormality. No suspicious osseous lesions identified. IMPRESSION: 1. Status post right upper lobectomy. Evidence of recurrent or metastatic disease in the chest. 2. Unchanged severe pulmonary fibrosis in a with basal gradient featuring irregular interstitial opacity, septal thickening, subpleural bronchiolectasis, and large areas honeycombing at the bilateral lung bases. Findings are consistent with UIP per consensus guidelines: Diagnosis of Idiopathic Pulmonary Fibrosis: An Official ATS/ERS/JRS/ALAT Clinical Practice Guideline. [place], Iss 5, [phone], Oct 29 2016. 3. Coronary artery disease. Aortic Atherosclerosis (ICD10-I70.0). Electronically Signed   By: Delanna Ahmadi M.D.   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 Kipp Brood on March 03, 2021. The patient is currently on observation and he is feeling fine except for the baseline shortness of breath secondary to pulmonary fibrosis. He had repeat CT scan of the chest performed recently.  I personally and  independently reviewed the scan and discussed the result with the patient today. His scan showed no concerning findings for disease recurrence or metastasis. I recommended for him to continue on observation with repeat CT scan of the chest in 6 months. The patient was advised to call immediately if he has any other concerning symptoms in the interval. The patient voices understanding of current disease status and treatment options and is in agreement with the current care plan.  All questions were answered. The patient knows to call the clinic with any problems, questions or concerns. We can certainly see the patient much sooner if necessary.  The total time spent in the appointment was 20 minutes.  Disclaimer: This note was dictated with voice recognition software. Similar sounding words can inadvertently be transcribed and may not be corrected upon review.

## 2021-09-22 ENCOUNTER — Encounter: Payer: PPO | Admitting: *Deleted

## 2021-09-22 DIAGNOSIS — J849 Interstitial pulmonary disease, unspecified: Secondary | ICD-10-CM | POA: Diagnosis not present

## 2021-09-22 NOTE — Progress Notes (Signed)
Daily Session Note  Patient Details  Name: Richard Parrish MRN: [id number] Date of Birth: 04/04/1950 Referring Provider:   Flowsheet Row Pulmonary Rehab from 07/14/2021 in [facility] Cardiac [facility] Pul[facility]hab  Referring Provider Marshell Garfinkel MD       Encounter Date: 09/22/2021  Check In:      Social History   Tobacco Use  Smoking Status Former   Packs/day: 2.00   Years: 15.00   Total pack years: 30.00   Types: Cigarettes   Quit date: 03/01/1987   Years since quitting: 34.5  Smokeless Tobacco Never  Tobacco Comments   03/02/21-Pt instructed to not vape or drink alcohol for 24 hours prior to surgery.     Goals Met:  Independence with exercise equipment Exercise tolerated well No report of concerns or symptoms today  Goals Unmet:  Not Applicable  Comments: Pt able to follow exercise prescription today without complaint.  Will continue to monitor for progression.    Dr. Emily Filbert is Medical Director for [facility].  Dr. Ottie Glazier is Medical Director for [facility] Pulmonary Rehabilitation.

## 2021-09-23 ENCOUNTER — Telehealth: Payer: Self-pay | Admitting: Pulmonary Disease

## 2021-09-23 ENCOUNTER — Encounter: Payer: PPO | Admitting: *Deleted

## 2021-09-23 DIAGNOSIS — J849 Interstitial pulmonary disease, unspecified: Secondary | ICD-10-CM

## 2021-09-23 NOTE — Progress Notes (Signed)
Daily Session Note  Patient Details  Name: Richard Parrish MRN: [id number] Date of Birth: 1950-12-15 Referring Provider:   Flowsheet Row Pulmonary Rehab from 07/14/2021 in [facility] Cardiac and Pulmonary Rehab  Referring Provider Marshell Garfinkel MD       Encounter Date: 09/23/2021  Check In:  Session Check In - 09/23/21 1542       Check-In   Supervising physician immediately available to respond to emergencies See telemetry face sheet for immediately available ER MD    Location [facility]-Cardiac & Pulmonary Rehab    Staff Present Hope Budds, RDN, LDN;Jessica Luan Pulling, MA, RCEP, CCRP, CCET;Eunique Balik Sherryll Burger, RN BSN    Virtual Visit No    Medication changes reported     No    Fall or balance concerns reported    No    Warm-up and Cool-down Performed on first and last piece of equipment    Resistance Training Performed Yes    VAD Patient? No    PAD/SET Patient? No      Pain Assessment   Currently in Pain? No/denies                Social History   Tobacco Use  Smoking Status Former   Packs/day: 2.00   Years: 15.00   Total pack years: 30.00   Types: Cigarettes   Quit date: 03/01/1987   Years since quitting: 34.5  Smokeless Tobacco Never  Tobacco Comments   03/02/21-Pt instructed to not vape or drink alcohol for 24 hours prior to surgery.     Goals Met:  Independence with exercise equipment Exercise tolerated well No report of concerns or symptoms today Strength training completed today  Goals Unmet:  Not Applicable  Comments: Pt able to follow exercise prescription today without complaint.  Will continue to monitor for progression.    Dr. Emily Filbert is Medical Director for [facility].  Dr. Ottie Glazier is Medical Director for [facility] Pulmonary Rehabilitation.

## 2021-09-23 NOTE — Telephone Encounter (Signed)
Fax received from Dr. Maia Petties with [facility] to perform a [place] on patient.  Patient needs surgery clearance. Surgery is 10/12/2021. Patient was seen on 07/20/2021. Office protocol is a risk assessment can be sent to surgeon if patient has been seen in 60 days or less.   Sending to Dr Vaughan Browner for risk assessment or recommendations if patient needs to be seen in office prior to surgical procedure.    Called and left voicemail for patient to call office back to schedule a follow up to clear him for surgery.

## 2021-09-24 ENCOUNTER — Ambulatory Visit (INDEPENDENT_AMBULATORY_CARE_PROVIDER_SITE_OTHER): Payer: PPO | Admitting: Pulmonary Disease

## 2021-09-24 ENCOUNTER — Encounter: Payer: Self-pay | Admitting: Pulmonary Disease

## 2021-09-24 VITALS — BP 132/66 | HR 89 | Temp 97.5°F | Ht 72.0 in | Wt 219.8 lb

## 2021-09-24 DIAGNOSIS — Z5181 Encounter for therapeutic drug level monitoring: Secondary | ICD-10-CM

## 2021-09-24 DIAGNOSIS — J84112 Idiopathic pulmonary fibrosis: Secondary | ICD-10-CM | POA: Diagnosis not present

## 2021-09-24 NOTE — Telephone Encounter (Signed)
OV notes and clearance form have been faxed back to [facility]. Nothing further needed at this time.

## 2021-09-24 NOTE — Progress Notes (Signed)
Richard Parrish    [id number]    11/26/1950  Primary Care Physician:Polite, Jori Moll, MD  Referring Physician: Seward Carol, MD [address] [place],  [place] [address]  Problem list: Follow up for interstitial lung disease Right upper lobe adenocarcinoma status post bronchoscopy and robotic resection by Drs Valeta Harms and Kipp Brood on 1/4 Recurrent pneumothorax PE  HPI: 71 year old with history of pulmonary fibrosis, GERD, hypertension, hyperlipidemia, pulmonary fibrosis He was diagnosed with unspecified pulmonary fibrosis and was seen in pulmonary clinic in 2015  Developed COVID-19 in March 2021 and reports worsening dyspnea since then.  Complains of chronic dyspnea on exertion, cough.  No fevers or chills  Imaging showed right upper lobe mass and he underwent combined bronchoscopy with Dr. Valeta Harms and subsequently right upper lobe robotic resection by Dr. Kipp Brood on 03/03/2021.  Saw Dr. Julien Nordmann from oncology on 03/24/2021 and no further chemotherapy recommended  Postsurgery he had recurrent issues pneumothorax and mildly exacerbation Treated in Jan with levofloxacin and prednisone. He had needle decompression and chest tube placed with Heimlich valve and mini VAC for pneumothorax.  Hospitalized on 2/26 under the care of cardiothoracic surgery.  Discharged on amoxicillin and additional prednisone taper.  Hospitalized in March 2023 with submassive pulmonary embolism, left lower extremity DVT.  Started on Eliquis.  He was also started on prednisone for ILD exacerbation continues at 50 mg/day  Pets: 3 cats Occupation: Used to work in Librarian, academic at [employer] Exposures: Exposed to [employer] carrier. Smoking history: 30-pack-year smoker.  Quit in 1989.  Currently vapes which helps with his back. Travel history: No significant recent travel history Relevant family history: No family history of lung disease  Interim history: Started Esbriet in May 2023.   So far is tolerating it well without any problem. Continues on prednisone taper and is currently at 20 mg a day  His spine doctor is requesting clearance for lumbar nerve root block  Outpatient Encounter Medications as of 09/24/2021  Medication Sig   acetaminophen (TYLENOL) 500 MG tablet Take 1 tablet (500 mg total) by mouth every 6 (six) hours as needed for mild pain.   ALPRAZolam (XANAX) 0.5 MG tablet Take 0.5-1 tablets (0.25-0.5 mg total) by mouth 3 (three) times daily as needed for anxiety or sleep.   antiseptic oral rinse (BIOTENE) LIQD 15 mLs by Mouth Rinse route daily as needed for dry mouth.   atorvastatin (LIPITOR) 80 MG tablet TAKE 1 TABLET BY MOUTH EVERY DAY   cholecalciferol (VITAMIN D3) 25 MCG (1000 UNIT) tablet Take 1,000 Units by mouth in the morning.   clopidogrel (PLAVIX) 75 MG tablet TAKE 1 TABLET BY MOUTH EVERY DAY (Patient taking differently: Take 75 mg by mouth daily.)   diphenhydrAMINE (BENADRYL) 25 mg capsule Take 25 mg by mouth at bedtime as needed for sleep (for sleep).   ELIQUIS 5 MG TABS tablet TAKE 1 TABLET BY MOUTH TWICE A DAY   gabapentin (NEURONTIN) 100 MG capsule Take 2 capsules (200 mg total) by mouth 3 (three) times daily.   guaiFENesin (MUCINEX) 600 MG 12 hr tablet Take 1 tablet (600 mg total) by mouth 2 (two) times daily as needed. (Patient taking differently: Take 600 mg by mouth 2 (two) times daily as needed for cough or to loosen phlegm.)   HYDROcodone-acetaminophen (NORCO) 7.5-325 MG tablet Take 1 tablet by mouth every 4 (four) hours as needed.   hyoscyamine (LEVSIN, ANASPAZ) 0.125 MG tablet Take 0.125 mg by mouth every  4 (four) hours as needed for cramping.    isosorbide mononitrate (IMDUR) 30 MG 24 hr tablet Take 30 mg by mouth daily.   metoprolol succinate (TOPROL-XL) 50 MG 24 hr tablet Take 0.5 tablets (25 mg total) by mouth daily. Take with or immediately following a meal.   nitroGLYCERIN (NITROSTAT) 0.4 MG SL tablet Place 0.4 mg under the tongue  every 5 (five) minutes as needed for chest pain.   pantoprazole (PROTONIX) 40 MG tablet TAKE 1 TABLET BY MOUTH EVERY DAY BEFORE BREAKFAST   PARoxetine (PAXIL) 40 MG tablet Take 40 mg by mouth in the morning.   Pirfenidone 267 MG TABS Take 3 tablets (801 mg total) by mouth 3 (three) times daily with meals.   predniSONE (DELTASONE) 10 MG tablet Take 40mg  daily for 1 month then reduce 10mg  monthly   Probiotic Product (PROBIOTIC PO) Take 1 capsule by mouth daily.   sodium chloride (OCEAN) 0.65 % SOLN nasal spray Place 1 spray into both nostrils daily as needed for congestion.   sulfamethoxazole-trimethoprim (BACTRIM DS) 800-160 MG tablet Take 1 tablet by mouth 3 (three) times a week.   tamsulosin (FLOMAX) 0.4 MG CAPS capsule Take 0.4 mg by mouth 2 (two) times daily.   No facility-administered encounter medications on file as of 09/24/2021.    Physical Exam: Blood pressure 132/66, pulse 89, temperature (!) 97.5 F (36.4 C), temperature source Oral, height 6' (1.829 m), weight 219 lb 12.8 oz (99.7 kg), SpO2 96 %. Gen:      No acute distress HEENT:  EOMI, sclera anicteric Neck:     No masses; no thyromegaly Lungs:    Bibasal crackles CV:         Regular rate and rhythm; no murmurs Abd:      + bowel sounds; soft, non-tender; no palpable masses, no distension Ext:    No edema; adequate peripheral perfusion Skin:      Warm and dry; no rash Neuro: alert and oriented x 3 Psych: normal mood and affect   Data Reviewed: Imaging: CT chest 07/18/2013-interstitial fibrosis with traction bronchiectasis with basilar predominance, emphysema.  Probable UIP pattern CT abdomen pelvis 02/07/17- basal pulmonary fibrosis. CT high-res 12/18/2020-new spiculated lung nodule in the right upper lobe PET scan 01/05/2021-uptake in the right upper lobe, equivocal uptake in the right hilum.  Left sphenoid sinus disease. Chest x-ray 04/01/2021-dense right lower lobe infiltrate CTA 04/13/2021-no PE, large right  pneumothorax CTA 05/18/2021-left-sided pulmonary embolism, worsening groundglass opacities, airspace disease-right greater than left. High-resolution CT 07/13/21 - resolution of consolidation and asbestos disease. Worsening pattern of pulmonary fibrosis in UIP pattern CT chest 09/17/2021-stable pattern of pulmonary fibrosis I have reviewed the images personally  PFTs: 11/27/2020 FVC 4.03 [85%], FEV1 2.95 [84%], F/F 73, TLC 6.09 [81%], DLCO 20.01 [73%] Minimal diffusion defect.  Labs: CTD serologies 11/26/2020-P ANCA 1:80  Assessment:  Preop evaluation lumbar injections He is due to get lumbar root block on 10/12/2021.  This is done with local anesthesia.  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 Elevation in ANCA is likely nonspecific as he has no signs of connective tissue disease.  I reviewed his biopsy with Dr. 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  So far is tolerating it well Recent labs earlier this month are stable. Continues on pulmonary rehab  Slow prednisone taper by 10 mg every month.  Currently at 20 mg/day  Recurrent right pneumothorax Resolved on repeat imaging  Right upper lobe lung adenocarcinoma S/p bronchoscopy and lobectomy Continue surveillance surveillance CT scan  Submassive PE Continue Eliquis  Plan/Recommendations: Continue Esbriet Continue prednisone taper Pulmonary rehab  Marshell Garfinkel MD [facility] 09/24/2021, 11:42 AM  CC: Seward Carol, MD

## 2021-09-24 NOTE — Telephone Encounter (Signed)
No contraindications to surgery.  See office visit note from 09/24/2021

## 2021-09-24 NOTE — Patient Instructions (Signed)
I am glad you are doing well with regard to breathing Your latest CT scan shows that the lung scarring is stable Follow-up in 3 months with in person or video visit.

## 2021-09-27 ENCOUNTER — Encounter: Payer: PPO | Admitting: *Deleted

## 2021-09-27 DIAGNOSIS — J849 Interstitial pulmonary disease, unspecified: Secondary | ICD-10-CM

## 2021-09-27 NOTE — Progress Notes (Signed)
Daily Session Note  Patient Details  Name: Richard Parrish MRN: [id number] Date of Birth: Nov 14, 1950 Referring Provider:   Flowsheet Row Pulmonary Rehab from 07/14/2021 in [facility] Cardiac and Pulmonary Rehab  Referring Provider Marshell Garfinkel MD       Encounter Date: 09/27/2021  Check In:  Session Check In - 09/27/21 [place]       Check-In   Supervising physician immediately available to respond to emergencies See telemetry face sheet for immediately available ER MD    Location [facility]-Cardiac & Pulmonary Rehab    Staff Present Nyoka Cowden, RN, BSN, MA;Meredith Sherryll Burger, RN Margurite Auerbach, MS, ASCM CEP, Exercise Physiologist    Virtual Visit No    Medication changes reported     No    Fall or balance concerns reported    No    Tobacco Cessation No Change    Warm-up and Cool-down Performed on first and last piece of equipment    Resistance Training Performed Yes    VAD Patient? No    PAD/SET Patient? No      Pain Assessment   Currently in Pain? No/denies                Social History   Tobacco Use  Smoking Status Former   Packs/day: 2.00   Years: 15.00   Total pack years: 30.00   Types: Cigarettes   Quit date: 03/01/1987   Years since quitting: 34.6  Smokeless Tobacco Never  Tobacco Comments   03/02/21-Pt instructed to not vape or drink alcohol for 24 hours prior to surgery.     Goals Met:  Independence with exercise equipment Exercise tolerated well No report of concerns or symptoms today  Goals Unmet:  Not Applicable  Comments: Pt able to follow exercise prescription today without complaint.  Will continue to monitor for progression.    Dr. Emily Filbert is Medical Director for [place].  Dr. Ottie Glazier is Medical Director for [facility] Pulmonary Rehabilitation.

## 2021-09-29 ENCOUNTER — Telehealth: Payer: Self-pay | Admitting: Internal Medicine

## 2021-09-29 ENCOUNTER — Encounter: Payer: PPO | Attending: Pulmonary Disease | Admitting: *Deleted

## 2021-09-29 DIAGNOSIS — J849 Interstitial pulmonary disease, unspecified: Secondary | ICD-10-CM | POA: Diagnosis present

## 2021-09-29 NOTE — Progress Notes (Signed)
Daily Session Note  Patient Details  Name: Richard Parrish MRN: [id number] Date of Birth: [birth year] Referring Provider:   Flowsheet Row Pulmonary Rehab from 07/14/2021 in [facility] Cardiac and Pulmonary Rehab  Referring Provider Marshell Garfinkel MD       Encounter Date: 09/29/2021  Check In:  Session Check In - 09/29/21 1750       Check-In   Supervising physician immediately available to respond to emergencies See telemetry face sheet for immediately available ER MD    Location [facility]-Cardiac & Pulmonary Rehab    Staff Present Nyoka Cowden, RN, BSN, MA;Meredith Sherryll Burger, RN BSN    Virtual Visit No    Medication changes reported     No    Fall or balance concerns reported    No    Tobacco Cessation No Change    Resistance Training Performed Yes    VAD Patient? No    PAD/SET Patient? No      Pain Assessment   Currently in Pain? No/denies                Social History   Tobacco Use  Smoking Status Former   Packs/day: 2.00   Years: 15.00   Total pack years: 30.00   Types: Cigarettes   Quit date: 03/01/1987   Years since quitting: 34.6  Smokeless Tobacco Never  Tobacco Comments   03/02/21-Pt instructed to not vape or drink alcohol for 24 hours prior to surgery.     Goals Met:  Independence with exercise equipment Exercise tolerated well No report of concerns or symptoms today  Goals Unmet:  Not Applicable  Comments: Pt able to follow exercise prescription today without complaint.  Will continue to monitor for progression.    Dr. Emily Filbert is Medical Director for [place].  Dr. Ottie Glazier is Medical Director for [facility] Pulmonary Rehabilitation.

## 2021-09-29 NOTE — Telephone Encounter (Signed)
Called patient regarding upcoming 2024 appointment, patient is  notified.

## 2021-09-30 ENCOUNTER — Encounter: Payer: PPO | Admitting: *Deleted

## 2021-09-30 DIAGNOSIS — J849 Interstitial pulmonary disease, unspecified: Secondary | ICD-10-CM | POA: Diagnosis not present

## 2021-09-30 NOTE — Progress Notes (Signed)
Daily Session Note  Patient Details  Name: Richard Parrish MRN: [id number] Date of Birth: June 23, 1950 Referring Provider:   Flowsheet Row Pulmonary Rehab from 07/14/2021 in [facility] Cardiac and Pulmonary Rehab  Referring Provider Marshell Garfinkel MD       Encounter Date: 09/30/2021  Check In:  Session Check In - 09/30/21 1614       Check-In   Supervising physician immediately available to respond to emergencies See telemetry face sheet for immediately available ER MD    Location [facility]-Cardiac & Pulmonary Rehab    Staff Present Heath Lark, RN, BSN, CCRP;Meredith Sherryll Burger, RN BSN    Virtual Visit No    Medication changes reported     No    Fall or balance concerns reported    No    Warm-up and Cool-down Performed on first and last piece of equipment    Resistance Training Performed Yes    VAD Patient? No    PAD/SET Patient? No      Pain Assessment   Currently in Pain? No/denies                Social History   Tobacco Use  Smoking Status Former   Packs/day: 2.00   Years: 15.00   Total pack years: 30.00   Types: Cigarettes   Quit date: 03/01/1987   Years since quitting: 34.6  Smokeless Tobacco Never  Tobacco Comments   03/02/21-Pt instructed to not vape or drink alcohol for 24 hours prior to surgery.     Goals Met:  Proper associated with RPD/PD & O2 Sat Independence with exercise equipment Exercise tolerated well No report of concerns or symptoms today  Goals Unmet:  Not Applicable  Comments: Pt able to follow exercise prescription today without complaint.  Will continue to monitor for progression.    Dr. Emily Filbert is Medical Director for [place].  Dr. Ottie Glazier is Medical Director for [facility] Pulmonary Rehabilitation.

## 2021-10-04 ENCOUNTER — Encounter: Payer: PPO | Admitting: *Deleted

## 2021-10-04 DIAGNOSIS — J849 Interstitial pulmonary disease, unspecified: Secondary | ICD-10-CM | POA: Diagnosis not present

## 2021-10-04 NOTE — Progress Notes (Signed)
Daily Session Note  Patient Details  Name: Richard Parrish MRN: [id number] Date of Birth: [birth year] Referring Provider:   Flowsheet Row Pulmonary Rehab from 07/14/2021 in [facility] Cardiac and Pulmonary Rehab  Referring Provider Marshell Garfinkel MD       Encounter Date: 10/04/2021  Check In:      Social History   Tobacco Use  Smoking Status Former   Packs/day: 2.00   Years: 15.00   Total pack years: 30.00   Types: Cigarettes   Quit date: 03/01/1987   Years since quitting: 34.6  Smokeless Tobacco Never  Tobacco Comments   03/02/21-Pt instructed to not vape or drink alcohol for 24 hours prior to surgery.     Goals Met:  Independence with exercise equipment Exercise tolerated well No report of concerns or symptoms today  Goals Unmet:  Not Applicable  Comments: Pt able to follow exercise prescription today without complaint.  Will continue to monitor for progression.    Dr. Emily Filbert is Medical Director for [place].  Dr. Ottie Glazier is Medical Director for [facility] Pulmonary Rehabilitation.

## 2021-10-06 ENCOUNTER — Emergency Department
Admission: EM | Admit: 2021-10-06 | Discharge: 2021-10-06 | Disposition: A | Discharge status: Home / Self Care | Payer: PPO | Attending: Student in an Organized Health Care Education/Training Program | Admitting: Student in an Organized Health Care Education/Training Program

## 2021-10-06 ENCOUNTER — Other Ambulatory Visit: Payer: Self-pay

## 2021-10-06 ENCOUNTER — Emergency Department: Payer: PPO

## 2021-10-06 ENCOUNTER — Encounter: Payer: Self-pay | Admitting: Emergency Medicine

## 2021-10-06 ENCOUNTER — Encounter: Payer: Self-pay | Admitting: *Deleted

## 2021-10-06 DIAGNOSIS — Z79899 Other long term (current) drug therapy: Secondary | ICD-10-CM | POA: Insufficient documentation

## 2021-10-06 DIAGNOSIS — R0789 Other chest pain: Secondary | ICD-10-CM | POA: Insufficient documentation

## 2021-10-06 DIAGNOSIS — J849 Interstitial pulmonary disease, unspecified: Secondary | ICD-10-CM

## 2021-10-06 LAB — BASIC METABOLIC PANEL
Anion gap: 8 (ref 5–15)
BUN: 14 mg/dL (ref 8–23)
CO2: 25 mmol/L (ref 22–32)
Calcium: 9 mg/dL (ref 8.9–10.3)
Chloride: 106 mmol/L (ref 98–111)
Creatinine, Ser: 0.88 mg/dL (ref 0.61–1.24)
GFR, Estimated: >60 mL/min (ref >60)
Glucose, Bld: 115 mg/dL — ABNORMAL HIGH (ref 70–99)
Potassium: 4.1 mmol/L (ref 3.5–5.1)
Sodium: 139 mmol/L (ref 135–145)

## 2021-10-06 LAB — CBC
HCT: 42 % (ref 39.0–52.0)
Hemoglobin: 14 g/dL (ref 13.0–17.0)
MCH: 32.1 pg (ref 26.0–34.0)
MCHC: 33.3 g/dL (ref 30.0–36.0)
MCV: 96.3 fL (ref 80.0–100.0)
Platelets: 335 10*3/uL (ref 150–400)
RBC: 4.36 MIL/uL (ref 4.22–5.81)
RDW: 13.6 % (ref 11.5–15.5)
WBC: 8.5 10*3/uL (ref 4.0–10.5)
nRBC: 0 % (ref 0.0–0.2)

## 2021-10-06 LAB — TROPONIN I (HIGH SENSITIVITY)
Troponin I (High Sensitivity): 12 ng/L (ref <18)
Troponin I (High Sensitivity): 14 ng/L (ref <18)

## 2021-10-06 NOTE — Progress Notes (Addendum)
Daily Session Note  Patient Details  Name: Richard Parrish MRN: [id number] Date of Birth: 05-Dec-1950 Referring Provider:   Flowsheet Row Pulmonary Rehab from 07/14/2021 in [facility] Cardiac and Pulmonary Rehab  Referring Provider Marshell Garfinkel MD       Encounter Date: 10/06/2021  Check In:  Session Check In - 10/06/21 1544       Check-In   Supervising physician immediately available to respond to emergencies See telemetry face sheet for immediately available ER MD    Location [facility]-Cardiac & Pulmonary Rehab    Staff Present Antionette Fairy, BS, Exercise Physiologist;Laureen Owens Shark, BS, RRT, CPFT;Susanne Bice, RN, BSN, Jacklynn Bue, MS, ASCM CEP, Exercise Physiologist    Virtual Visit No    Medication changes reported     No    Fall or balance concerns reported    No    Warm-up and Cool-down Performed on first and last piece of equipment    Resistance Training Performed Yes    VAD Patient? No    PAD/SET Patient? No      Pain Assessment   Currently in Pain? No/denies    Multiple Pain Sites No                Social History   Tobacco Use  Smoking Status Former   Packs/day: 2.00   Years: 15.00   Total pack years: 30.00   Types: Cigarettes   Quit date: 03/01/1987   Years since quitting: 34.6  Smokeless Tobacco Never  Tobacco Comments   03/02/21-Pt instructed to not vape or drink alcohol for 24 hours prior to surgery.     Goals Met: N/A sent to ED for 5/10 chest pain  Goals Unmet:  Not Applicable  Comments: Session stopped see note below  Update 1723: Pt left education room and continued to sit in waiting area complaining of 5/10 chest pain. BP, HR, O2, vitals were all stable. BP: 118/72, HR: 85 bpm, O2: 98%, placed on heart monitor with NSR. Rapid response was immediately called as pt chest pain continued and was transported to ED for further work up.  Dr. Emily Filbert is Medical Director for [place].  Dr. Ottie Glazier is Medical Director  for [facility] Pulmonary Rehabilitation.

## 2021-10-06 NOTE — ED Triage Notes (Signed)
Patient c/o intermittent central chest pain radiating into back over the past week and a half. Patient has been using nitroglycerin every other day over the past week. Patient on 5L [place] at baseline.

## 2021-10-06 NOTE — Progress Notes (Signed)
Pulmonary Individual Treatment Plan  Patient Details  Name: SWAN ZAYED MRN: [id number] Date of Birth: 1951-02-27 Referring Provider:   Flowsheet Row Pulmonary Rehab from 07/14/2021 in [facility] Cardiac and Pulmonary Rehab  Referring Provider Marshell Garfinkel MD       Initial Encounter Date:  Flowsheet Row Pulmonary Rehab from 07/14/2021 in [facility] Cardiac and Pulmonary Rehab  Date 07/14/21       Visit Diagnosis: ILD (interstitial lung disease) ([place])  Patient's Home Medications on Admission:  Current Outpatient Medications:    acetaminophen (TYLENOL) 500 MG tablet, Take 1 tablet (500 mg total) by mouth every 6 (six) hours as needed for mild pain., Disp: , Rfl:    ALPRAZolam (XANAX) 0.5 MG tablet, Take 0.5-1 tablets (0.25-0.5 mg total) by mouth 3 (three) times daily as needed for anxiety or sleep., Disp: 5 tablet, Rfl: 0   antiseptic oral rinse (BIOTENE) LIQD, 15 mLs by Mouth Rinse route daily as needed for dry mouth., Disp: , Rfl:    atorvastatin (LIPITOR) 80 MG tablet, TAKE 1 TABLET BY MOUTH EVERY DAY, Disp: 90 tablet, Rfl: 0   cholecalciferol (VITAMIN D3) 25 MCG (1000 UNIT) tablet, Take 1,000 Units by mouth in the morning., Disp: , Rfl:    clopidogrel (PLAVIX) 75 MG tablet, TAKE 1 TABLET BY MOUTH EVERY DAY (Patient taking differently: Take 75 mg by mouth daily.), Disp: 30 tablet, Rfl: 11   diphenhydrAMINE (BENADRYL) 25 mg capsule, Take 25 mg by mouth at bedtime as needed for sleep (for sleep)., Disp: , Rfl:    ELIQUIS 5 MG TABS tablet, TAKE 1 TABLET BY MOUTH TWICE A DAY, Disp: 180 tablet, Rfl: 1   gabapentin (NEURONTIN) 100 MG capsule, Take 2 capsules (200 mg total) by mouth 3 (three) times daily., Disp: 90 capsule, Rfl: 1   guaiFENesin (MUCINEX) 600 MG 12 hr tablet, Take 1 tablet (600 mg total) by mouth 2 (two) times daily as needed. (Patient taking differently: Take 600 mg by mouth 2 (two) times daily as needed for cough or to loosen phlegm.), Disp: , Rfl:    HYDROcodone-acetaminophen  (NORCO) 7.5-325 MG tablet, Take 1 tablet by mouth every 4 (four) hours as needed., Disp: , Rfl:    hyoscyamine (LEVSIN, ANASPAZ) 0.125 MG tablet, Take 0.125 mg by mouth every 4 (four) hours as needed for cramping. , Disp: , Rfl:    isosorbide mononitrate (IMDUR) 30 MG 24 hr tablet, Take 30 mg by mouth daily., Disp: , Rfl:    metoprolol succinate (TOPROL-XL) 50 MG 24 hr tablet, Take 0.5 tablets (25 mg total) by mouth daily. Take with or immediately following a meal., Disp: , Rfl:    nitroGLYCERIN (NITROSTAT) 0.4 MG SL tablet, Place 0.4 mg under the tongue every 5 (five) minutes as needed for chest pain., Disp: , Rfl:    pantoprazole (PROTONIX) 40 MG tablet, TAKE 1 TABLET BY MOUTH EVERY DAY BEFORE BREAKFAST, Disp: 90 tablet, Rfl: 1   PARoxetine (PAXIL) 40 MG tablet, Take 40 mg by mouth in the morning., Disp: , Rfl: 6   Pirfenidone 267 MG TABS, Take 3 tablets (801 mg total) by mouth 3 (three) times daily with meals., Disp: 270 tablet, Rfl: 1   predniSONE (DELTASONE) 10 MG tablet, Take 4m daily for 1 month then reduce 117mmonthly, Disp: 120 tablet, Rfl: 2   Probiotic Product (PROBIOTIC PO), Take 1 capsule by mouth daily., Disp: , Rfl:    sodium chloride (OCEAN) 0.65 % SOLN nasal spray, Place 1 spray into both nostrils daily  as needed for congestion., Disp: , Rfl:    sulfamethoxazole-trimethoprim (BACTRIM DS) 800-160 MG tablet, Take 1 tablet by mouth 3 (three) times a week., Disp: 12 tablet, Rfl: 5   tamsulosin (FLOMAX) 0.4 MG CAPS capsule, Take 0.4 mg by mouth 2 (two) times daily., Disp: , Rfl:   Past Medical History: Past Medical History:  Diagnosis Date   Anxiety    Arthritis    "lower back" (04/07/2017)   BPH (benign prostatic hypertrophy)    C. difficile diarrhea    Chronic lower back pain    Coronary artery disease 1989   cabg with dvg to [facility]    Depression    GERD (gastroesophageal reflux disease)    Hyperlipidemia    Hypertension    Insomnia    Myocardial infarction ([place]) 2002   OSA  on CPAP    cpap setting of 60 per pt   Osteoarthritis    "hips" (04/07/2017)   PAD (peripheral artery disease) ([place])    Peripheral vascular disease ([place])    Pneumonia 1989   S/P CABG   Pulmonary embolism ([place]) 05/18/2021   Pulmonary fibrosis ([place])    "one time" (04/07/2017)   Spinal stenosis     Tobacco Use: Social History   Tobacco Use  Smoking Status Former   Packs/day: 2.00   Years: 15.00   Total pack years: 30.00   Types: Cigarettes   Quit date: 03/01/1987   Years since quitting: 34.6  Smokeless Tobacco Never  Tobacco Comments   03/02/21-Pt instructed to not vape or drink alcohol for 24 hours prior to surgery.     Labs: Review Flowsheet  More data exists      Latest Ref Rng & Units 10/04/2020 10/05/2020 03/02/2021 04/13/2021 05/18/2021  Labs for ITP Cardiac and Pulmonary Rehab  Cholestrol 0 - 200 mg/dL 97  - - - -  LDL (calc) 0 - 99 mg/dL 53  - - - -  HDL-C >40 mg/dL 35  - - - -  Trlycerides <150 mg/dL 46  - - - -  PH, Arterial 7.350 - 7.450 - 7.355  7.402  - -  PCO2 arterial 32.0 - 48.0 mmHg - 41.9  38.9  - -  Bicarbonate 20.0 - 28.0 mmol/L - 23.4  23.7  23.7  - -  TCO2 22 - 32 mmol/L - 25  25  - 29  25   Acid-base deficit 0.0 - 2.0 mmol/L - 2.0  2.0  0.5  - -  O2 Saturation % - 99.0  70.0  97.6  - -     Pulmonary Assessment Scores:  Pulmonary Assessment Scores     Row Name 07/14/21 1228         ADL UCSD   ADL Phase Entry     SOB Score total 55     Rest 0     Walk 3     Stairs 4     Bath 3     Dress 2     Shop 2       CAT Score   CAT Score 21       mMRC Score   mMRC Score 3              UCSD: Self-administered rating of dyspnea associated with activities of daily living (ADLs) 6-point scale (0 = "not at all" to 5 = "maximal or unable to do because of breathlessness")  Scoring Scores range from 0 to 120.  Minimally important difference is 5  units  CAT: CAT can identify the health impairment of COPD patients and is better correlated with disease  progression.  CAT has a scoring range of zero to 40. The CAT score is classified into four groups of low (less than 10), medium (10 - 20), high (21-30) and very high (31-40) based on the impact level of disease on health status. A CAT score over 10 suggests significant symptoms.  A worsening CAT score could be explained by an exacerbation, poor medication adherence, poor inhaler technique, or progression of COPD or comorbid conditions.  CAT MCID is 2 points  mMRC: mMRC (Modified Medical Research Council) Dyspnea Scale is used to assess the degree of baseline functional disability in patients of respiratory disease due to dyspnea. No minimal important difference is established. A decrease in score of 1 point or greater is considered a positive change.   Pulmonary Function Assessment:   Exercise Target Goals: Exercise Program Goal: Individual exercise prescription set using results from initial 6 min walk test and THRR while considering  patient's activity barriers and safety.   Exercise Prescription Goal: Initial exercise prescription builds to 30-45 minutes a day of aerobic activity, 2-3 days per week.  Home exercise guidelines will be given to patient during program as part of exercise prescription that the participant will acknowledge.  Education: Aerobic Exercise: - Group verbal and visual presentation on the components of exercise prescription. Introduces F.I.T.T principle from ACSM for exercise prescriptions.  Reviews F.I.T.T. principles of aerobic exercise including progression. Written material given at graduation.   Education: Resistance Exercise: - Group verbal and visual presentation on the components of exercise prescription. Introduces F.I.T.T principle from ACSM for exercise prescriptions  Reviews F.I.T.T. principles of resistance exercise including progression. Written material given at graduation.    Education: Exercise & Equipment Safety: - Individual verbal instruction and  demonstration of equipment use and safety with use of the equipment. Flowsheet Row Pulmonary Rehab from 09/22/2021 in [facility] Cardiac and Pulmonary Rehab  Education need identified 07/14/21  Date 07/14/21  Educator [place]  Instruction Review Code 1- Verbalizes Understanding       Education: Exercise Physiology & General Exercise Guidelines: - Group verbal and written instruction with models to review the exercise physiology of the cardiovascular system and associated critical values. Provides general exercise guidelines with specific guidelines to those with heart or lung disease.  Flowsheet Row Pulmonary Rehab from 09/22/2021 in [facility] Cardiac and Pulmonary Rehab  Education need identified 07/14/21  Date 08/25/21  Educator [employer]  Instruction Review Code 1- [employer] Understanding       Education: Flexibility, Balance, Mind/Body Relaxation: - Group verbal and visual presentation with interactive activity on the components of exercise prescription. Introduces F.I.T.T principle from ACSM for exercise prescriptions. Reviews F.I.T.T. principles of flexibility and balance exercise training including progression. Also discusses the mind body connection.  Reviews various relaxation techniques to help reduce and manage stress (i.e. Deep breathing, progressive muscle relaxation, and visualization). Balance handout provided to take home. Written material given at graduation.   Activity Barriers & Risk Stratification:  Activity Barriers & Cardiac Risk Stratification - 07/14/21 1301       Activity Barriers & Cardiac Risk Stratification   Activity Barriers Shortness of Breath;Deconditioning;Back Problems;History of Falls;Joint Problems;Muscular Weakness             6 Minute Walk:  6 Minute Walk     Row Name 07/14/21 1301         6 Minute Walk   Phase Initial  Distance 815 feet     Walk Time 6 minutes     # of Rest Breaks 1  Stopped at 5:51     MPH 1.54     METS 1.92     RPE 11      Perceived Dyspnea  2     VO2 Peak 6.74     Symptoms Yes (comment)     Comments SOB, back pain 5/10, hip pain 4/10     Resting HR 79 bpm     Resting BP 110/68     Resting Oxygen Saturation  93 %     Exercise Oxygen Saturation  during 6 min walk 85 %     Max Ex. HR 97 bpm     Max Ex. BP 126/72     2 Minute Post BP 112/68       Interval HR   1 Minute HR 79     2 Minute HR 92     3 Minute HR 97     4 Minute HR 97     5 Minute HR 97     6 Minute HR 96     2 Minute Post HR 75     Interval Heart Rate? Yes       Interval Oxygen   Interval Oxygen? Yes     Baseline Oxygen Saturation % 93 %     1 Minute Oxygen Saturation % 92 %     1 Minute Liters of Oxygen 5 L     2 Minute Oxygen Saturation % 90 %     2 Minute Liters of Oxygen 5 L     3 Minute Oxygen Saturation % 86 %     3 Minute Liters of Oxygen 5 L     4 Minute Oxygen Saturation % 85 %     4 Minute Liters of Oxygen 5 L     5 Minute Oxygen Saturation % 86 %     5 Minute Liters of Oxygen 5 L     6 Minute Oxygen Saturation % 87 %     6 Minute Liters of Oxygen 5 L     2 Minute Post Oxygen Saturation % 96 %     2 Minute Post Liters of Oxygen 5 L             Oxygen Initial Assessment:  Oxygen Initial Assessment - 07/14/21 1228       Home Oxygen   Home Oxygen Device Home Concentrator;E-Tanks    Sleep Oxygen Prescription Continuous    Liters per minute 4    Home Exercise Oxygen Prescription Continuous    Liters per minute 4    Home Resting Oxygen Prescription Continuous    Liters per minute 4    Compliance with Home Oxygen Use Yes      Initial 6 min Walk   Oxygen Used Pulsed    Liters per minute 5      Program Oxygen Prescription   Program Oxygen Prescription Continuous    Liters per minute 4      Intervention   Short Term Goals To learn and exhibit compliance with exercise, home and travel O2 prescription;To learn and understand importance of maintaining oxygen saturations>88%;To learn and demonstrate proper use  of respiratory medications;To learn and understand importance of monitoring SPO2 with pulse oximeter and demonstrate accurate use of the pulse oximeter.;To learn and demonstrate proper pursed lip breathing techniques or other breathing techniques.     Long  Term Goals Exhibits compliance with exercise, home  and travel O2 prescription;Maintenance of O2 saturations>88%;Compliance with respiratory medication;Verbalizes importance of monitoring SPO2 with pulse oximeter and return demonstration;Exhibits proper breathing techniques, such as pursed lip breathing or other method taught during program session;Demonstrates proper use of MDI's             Oxygen Re-Evaluation:  Oxygen Re-Evaluation     Row Name 07/19/21 1534 07/22/21 1543 08/23/21 1605 09/09/21 1547       Program Oxygen Prescription   Program Oxygen Prescription Continuous Continuous Continuous Continuous    Liters per minute 4 4 4 5       Home Oxygen   Home Oxygen Device Home Concentrator;E-Tanks Home Concentrator;E-Tanks Home Concentrator;E-Tanks Home Concentrator;E-Tanks    Sleep Oxygen Prescription Continuous Continuous Continuous Continuous    Liters per minute 4 4 4 4     Home Exercise Oxygen Prescription Continuous Continuous Continuous Continuous    Liters per minute 4 4 4 5     Home Resting Oxygen Prescription Continuous Continuous Continuous Continuous    Liters per minute 4 4 4 4     Compliance with Home Oxygen Use Yes Yes Yes  He usually wears his O2, but sometimes he will see if his O2 drops at certain times - such as walking down to rehab. Yes      Goals/Expected Outcomes   Short Term Goals To learn and exhibit compliance with exercise, home and travel O2 prescription;To learn and understand importance of maintaining oxygen saturations>88%;To learn and understand importance of monitoring SPO2 with pulse oximeter and demonstrate accurate use of the pulse oximeter.;To learn and demonstrate proper pursed lip breathing  techniques or other breathing techniques.  To learn and understand importance of monitoring SPO2 with pulse oximeter and demonstrate accurate use of the pulse oximeter.;To learn and understand importance of maintaining oxygen saturations>88% To learn and understand importance of monitoring SPO2 with pulse oximeter and demonstrate accurate use of the pulse oximeter.;To learn and understand importance of maintaining oxygen saturations>88% To learn and understand importance of monitoring SPO2 with pulse oximeter and demonstrate accurate use of the pulse oximeter.;To learn and understand importance of maintaining oxygen saturations>88%    Long  Term Goals Exhibits compliance with exercise, home  and travel O2 prescription;Maintenance of O2 saturations>88%;Verbalizes importance of monitoring SPO2 with pulse oximeter and return demonstration;Exhibits proper breathing techniques, such as pursed lip breathing or other method taught during program session Verbalizes importance of monitoring SPO2 with pulse oximeter and return demonstration;Maintenance of O2 saturations>88% Verbalizes importance of monitoring SPO2 with pulse oximeter and return demonstration;Maintenance of O2 saturations>88% Verbalizes importance of monitoring SPO2 with pulse oximeter and return demonstration;Maintenance of O2 saturations>88%    Comments Reviewed PLB technique with pt.  Talked about how it works and it's importance in maintaining their exercise saturations. He has a pulse oximeter to check his oxygen saturation at home. Informed and explained why it is important to have one. Reviewed that oxygen saturations should be 88 percent and above. Bill reports his shortness of breath has been improving since starting rehab. He checks his O2 all the time and keeps a pulse oximeter on him - he makes sure his O2 does not dip below 88. He practices PLB when he gets short of breath. He also practices diaphragmatic breathing laying down. Bill reports his  shortness of breath has gotten worse about a month ago. He will take breaks as needed when performing ADLs; he has two pulse oximeters which he uses - he knows his  O2 should stay above 88%. He continues to practice diaphragmatic breathing. He continues to use PLB when he has shortness of breath and feels this helps.    Goals/Expected Outcomes Short: Become more profiecient at using PLB.   Long: Become independent at using PLB. Short: monitor oxygen at home with exertion. Long: maintain oxygen saturations above 88 percent independently. Short: continue to check O2 Long: maintain oxygen saturations above 88 percent independently. Short: continue to check O2 Long: maintain oxygen saturations above 88 percent independently.             Oxygen Discharge (Final Oxygen Re-Evaluation):  Oxygen Re-Evaluation - 09/09/21 1547       Program Oxygen Prescription   Program Oxygen Prescription Continuous    Liters per minute 5      Home Oxygen   Home Oxygen Device Home Concentrator;E-Tanks    Sleep Oxygen Prescription Continuous    Liters per minute 4    Home Exercise Oxygen Prescription Continuous    Liters per minute 5    Home Resting Oxygen Prescription Continuous    Liters per minute 4    Compliance with Home Oxygen Use Yes      Goals/Expected Outcomes   Short Term Goals To learn and understand importance of monitoring SPO2 with pulse oximeter and demonstrate accurate use of the pulse oximeter.;To learn and understand importance of maintaining oxygen saturations>88%    Long  Term Goals Verbalizes importance of monitoring SPO2 with pulse oximeter and return demonstration;Maintenance of O2 saturations>88%    Comments Bill reports his shortness of breath has gotten worse about a month ago. He will take breaks as needed when performing ADLs; he has two pulse oximeters which he uses - he knows his O2 should stay above 88%. He continues to practice diaphragmatic breathing. He continues to use PLB when he  has shortness of breath and feels this helps.    Goals/Expected Outcomes Short: continue to check O2 Long: maintain oxygen saturations above 88 percent independently.             Initial Exercise Prescription:  Initial Exercise Prescription - 07/14/21 1300       Date of Initial Exercise RX and Referring Provider   Date 07/14/21    Referring Provider Marshell Garfinkel MD      Oxygen   Oxygen Continuous    Liters 4    Maintain Oxygen Saturation 88% or higher      NuStep   Level 1    SPM 80    Minutes 15    METs 1.9      T5 Nustep   Level 1    SPM 80    Minutes 15    METs 1.9      Biostep-RELP   Level 1    SPM 50    Minutes 15    METs 1.9      Track   Laps 15    Minutes 15    METs 1.82      Prescription Details   Frequency (times per week) 3    Duration Progress to 30 minutes of continuous aerobic without signs/symptoms of physical distress      Intensity   THRR 40-80% of Max Heartrate 106 - 134    Ratings of Perceived Exertion 11-13    Perceived Dyspnea 0-4      Progression   Progression Continue to progress workloads to maintain intensity without signs/symptoms of physical distress.      Resistance Training  Training Prescription Yes    Weight 3 lb    Reps 10-15             Perform Capillary Blood Glucose checks as needed.  Exercise Prescription Changes:   Exercise Prescription Changes     Row Name 07/14/21 1300 07/29/21 0800 08/13/21 0800 08/23/21 1100 09/06/21 1600     Response to Exercise   Blood Pressure (Admit) 110/68 114/66 134/70 102/68 146/70   Blood Pressure (Exercise) 126/72 134/70 148/78 138/74 --   Blood Pressure (Exit) 112/68 104/60 124/64 102/64 124/68   Heart Rate (Admit) 79 bpm 73 bpm 90 bpm 87 bpm 94 bpm   Heart Rate (Exercise) 97 bpm 103 bpm 94 bpm 100 bpm 107 bpm   Heart Rate (Exit) 75 bpm 85 bpm 89 bpm 90 bpm 98 bpm   Oxygen Saturation (Admit) 93 % 95 % 91 % 95 % 88 %   Oxygen Saturation (Exercise) 85 % 86 % 92 % 93  % 92 %   Oxygen Saturation (Exit) 96 % 97 % 93 % 96 % 98 %   Rating of Perceived Exertion (Exercise) 11 13 13 13 14    Perceived Dyspnea (Exercise) 2 3 1 3 3    Symptoms SOB, back pain 5/10, hip pain 4/10 SOB SOB SOB SOB   Comments walk test results -- -- -- --   Duration -- Progress to 30 minutes of  aerobic without signs/symptoms of physical distress Continue with 30 min of aerobic exercise without signs/symptoms of physical distress. Continue with 30 min of aerobic exercise without signs/symptoms of physical distress. Continue with 30 min of aerobic exercise without signs/symptoms of physical distress.   Intensity -- THRR unchanged THRR unchanged THRR unchanged THRR unchanged     Progression   Progression -- Continue to progress workloads to maintain intensity without signs/symptoms of physical distress. Continue to progress workloads to maintain intensity without signs/symptoms of physical distress. Continue to progress workloads to maintain intensity without signs/symptoms of physical distress. Continue to progress workloads to maintain intensity without signs/symptoms of physical distress.   Average METs -- 2.27 2.35 2.3 2.59     Resistance Training   Training Prescription -- Yes Yes Yes Yes   Weight -- 3 lb 3 lb 3 lb 6 lb   Reps -- 10-15 10-15 10-15 10-15     Interval Training   Interval Training -- -- -- -- No     Oxygen   Oxygen -- Continuous Continuous Continuous Continuous   Liters -- 4 4 4 3      NuStep   Level -- 4 4 6 4    Minutes -- 15 15 15 15    METs -- 2.4 2.4 2.8 2.6     REL-XR   Level -- -- -- -- 6   Minutes -- -- -- -- 15   METs -- -- -- -- 3.1     T5 Nustep   Level -- 3 5 -- --   Minutes -- 15 15 -- --   METs -- 2.4 2.3 -- --     Biostep-RELP   Level -- 2 -- -- --   Minutes -- 15 -- -- --   METs -- 2 -- -- --     Track   Laps -- 15 -- 24 20   Minutes -- 15 15 15 15    METs -- 1.82 -- 2.31 2.09     Oxygen   Maintain Oxygen Saturation -- 88% or higher  88% or higher 88% or higher  88% or higher    Row Name 09/09/21 1600 09/23/21 0800 10/05/21 1500         Response to Exercise   Blood Pressure (Admit) -- 122/64 112/76     Blood Pressure (Exit) -- 112/64 118/60     Heart Rate (Admit) -- 100 bpm 77 bpm     Heart Rate (Exercise) -- 109 bpm 106 bpm     Heart Rate (Exit) -- 104 bpm 85 bpm     Oxygen Saturation (Admit) -- 98 % 98 %     Oxygen Saturation (Exercise) -- 90 % 88 %     Oxygen Saturation (Exit) -- 97 % 96 %     Rating of Perceived Exertion (Exercise) -- 13 14     Perceived Dyspnea (Exercise) -- 2 2     Symptoms -- SOB SOB     Duration -- Continue with 30 min of aerobic exercise without signs/symptoms of physical distress. Continue with 30 min of aerobic exercise without signs/symptoms of physical distress.     Intensity -- THRR unchanged THRR unchanged       Progression   Progression -- Continue to progress workloads to maintain intensity without signs/symptoms of physical distress. Continue to progress workloads to maintain intensity without signs/symptoms of physical distress.     Average METs -- 3 2.14       Resistance Training   Training Prescription -- Yes Yes     Weight -- 6 lb 6 lb     Reps -- 10-15 10-15       Interval Training   Interval Training -- No No       Oxygen   Oxygen -- Continuous Continuous     Liters -- 3 4       Recumbant Bike   Level -- 6 6     Watts -- 25 39     Minutes -- 15 15     METs -- 2.8 2.78       NuStep   Level -- 5 5     Minutes -- 15 15     METs -- 3 2.1       REL-XR   Level -- -- 1     Minutes -- -- 15       T5 Nustep   Level -- 5 --     Minutes -- 15 --     METs -- 2.4 --       Track   Laps -- -- 10     Minutes -- -- 15     METs -- -- 1.54       Home Exercise Plan   Plans to continue exercise at Home (comment)  walking, staff videos Home (comment)  walking, staff videos Home (comment)  walking, staff videos     Frequency Add 2 additional days to program exercise  sessions. Add 2 additional days to program exercise sessions. Add 2 additional days to program exercise sessions.     Initial Home Exercises Provided 09/09/21 09/09/21 09/09/21       Oxygen   Maintain Oxygen Saturation -- 88% or higher 88% or higher              Exercise Comments:   Exercise Comments     Row Name 07/19/21 1533           Exercise Comments First full day of exercise!  Patient was oriented to gym and equipment including functions, settings, policies, and procedures.  Patient's individual  exercise prescription and treatment plan were reviewed.  All starting workloads were established based on the results of the 6 minute walk test done at initial orientation visit.  The plan for exercise progression was also introduced and progression will be customized based on patient's performance and goals.                Exercise Goals and Review:   Exercise Goals     Row Name 07/14/21 1309             Exercise Goals   Increase Physical Activity Yes       Intervention Provide advice, education, support and counseling about physical activity/exercise needs.;Develop an individualized exercise prescription for aerobic and resistive training based on initial evaluation findings, risk stratification, comorbidities and participant's personal goals.       Expected Outcomes Short Term: Attend rehab on a regular basis to increase amount of physical activity.;Long Term: Add in home exercise to make exercise part of routine and to increase amount of physical activity.;Long Term: Exercising regularly at least 3-5 days a week.       Increase Strength and Stamina Yes       Intervention Provide advice, education, support and counseling about physical activity/exercise needs.;Develop an individualized exercise prescription for aerobic and resistive training based on initial evaluation findings, risk stratification, comorbidities and participant's personal goals.       Expected Outcomes  Short Term: Increase workloads from initial exercise prescription for resistance, speed, and METs.;Short Term: Perform resistance training exercises routinely during rehab and add in resistance training at home;Long Term: Improve cardiorespiratory fitness, muscular endurance and strength as measured by increased METs and functional capacity (6MWT)       Able to understand and use rate of perceived exertion (RPE) scale Yes       Intervention Provide education and explanation on how to use RPE scale       Expected Outcomes Short Term: Able to use RPE daily in rehab to express subjective intensity level;Long Term:  Able to use RPE to guide intensity level when exercising independently       Able to understand and use Dyspnea scale Yes       Intervention Provide education and explanation on how to use Dyspnea scale       Expected Outcomes Short Term: Able to use Dyspnea scale daily in rehab to express subjective sense of shortness of breath during exertion;Long Term: Able to use Dyspnea scale to guide intensity level when exercising independently       Knowledge and understanding of Target Heart Rate Range (THRR) Yes       Intervention Provide education and explanation of THRR including how the numbers were predicted and where they are located for reference       Expected Outcomes Short Term: Able to state/look up THRR;Long Term: Able to use THRR to govern intensity when exercising independently;Short Term: Able to use daily as guideline for intensity in rehab       Able to check pulse independently Yes       Intervention Provide education and demonstration on how to check pulse in carotid and radial arteries.;Review the importance of being able to check your own pulse for safety during independent exercise       Expected Outcomes Short Term: Able to explain why pulse checking is important during independent exercise;Long Term: Able to check pulse independently and accurately       Understanding of Exercise  Prescription Yes  Intervention Provide education, explanation, and written materials on patient's individual exercise prescription       Expected Outcomes Short Term: Able to explain program exercise prescription;Long Term: Able to explain home exercise prescription to exercise independently                Exercise Goals Re-Evaluation :  Exercise Goals Re-Evaluation     Row Name 07/19/21 1533 07/29/21 0819 08/13/21 0819 08/23/21 1143 08/23/21 1608     Exercise Goal Re-Evaluation   Exercise Goals Review Increase Physical Activity;Knowledge and understanding of Target Heart Rate Range (THRR);Understanding of Exercise Prescription;Able to understand and use rate of perceived exertion (RPE) scale;Increase Strength and Stamina;Able to understand and use Dyspnea scale;Able to check pulse independently Increase Physical Activity;Increase Strength and Stamina;Understanding of Exercise Prescription Increase Physical Activity;Increase Strength and Stamina;Understanding of Exercise Prescription Increase Physical Activity;Increase Strength and Stamina;Understanding of Exercise Prescription Increase Physical Activity;Increase Strength and Stamina;Understanding of Exercise Prescription   Comments Reviewed RPE and dyspnea scales, THR and program prescription with pt today.  Pt voiced understanding and was given a copy of goals to take home. [name] has had a good start in rehab. He has increased his load to level 4 on the T4 machine. He has also tolerated 3 lb hand weights for resistance training. We will continue to monitor Bill's progress in the program. [name] is doing well in rehab.  He is up to level 5 on the T5! We will encourage him to try 4 lb hand weights.  We will continue to monitor his progress. [name] is doing well in rehab. He increased to 24 laps on the track. He also improved to level 6 on the T4 machine as well. We will encourage him to try 4 lb hand weights for resistance training. We will continue  to monitor his progress in the program. [name] reports staying active and working a lot. He is still a member of [employer].   Expected Outcomes Short: Use RPE daily to regulate intensity. Long: Follow program prescription in THR. Short: Continue to attend rehab. Long: Improve MET levels during exercise. Short: Try 4 lb hand weights Long: Continue to improve stamina Short: Try 4 lb hand weights Long: Continue to improve MET levels --    Row Name 09/06/21 1604 09/09/21 1615 09/23/21 0858 10/05/21 1528       Exercise Goal Re-Evaluation   Exercise Goals Review Increase Physical Activity;Increase Strength and Stamina;Understanding of Exercise Prescription Increase Physical Activity;Increase Strength and Stamina;Understanding of Exercise Prescription;Able to understand and use rate of perceived exertion (RPE) scale;Able to understand and use Dyspnea scale;Knowledge and understanding of Target Heart Rate Range (THRR);Able to check pulse independently Increase Physical Activity;Increase Strength and Stamina;Understanding of Exercise Prescription Increase Physical Activity;Increase Strength and Stamina;Understanding of Exercise Prescription    Comments [name] is doing well in rehab.  He is up to level 4 on the NuStep and level 6 on the XR.  He is also now using 6 lb weights.  He does have a difficult time arriving on time and says her keeps getting lost but has not brought it up to doctor yet.  We will continue to montior his progress. Reviewed home exercise with pt today.  Pt plans to continue walking at home for exercise.  He has been walking with work, but not consistent enough to count as exercise, so we talked about adding his time together at once.  Reviewed THR, pulse, RPE, sign and symptoms, pulse oximetery and when to call 911 or  MD.  Also discussed weather considerations and indoor options.  Pt voiced understanding. [name] continues to do well in rehab.  He has increased to level 5 on both the T4 and T5  Nustep. His oxygen is staying above 88% during exercise and RPEs are appropriate. He did not walk much last time and will encourage him to increase his amount of walking overtime. Will continue to monitor. [name] is doing well in rehab. He improved to level 6 on the bike. He did have to go back up from 3L to 4L of oxygen during exercise. Also, his overall average METs did go down a little since the last evaluation to 2.14 METs. We will continue to monitor his progress in the program.    Expected Outcomes Short: Continue to move up on workloads Long: Continue to improve stamina Short: Add in more time together to get 30 min Long: Conitnue to improve stamina Short: Increase walking and get laps up on track Long: Continue to increase overall MET level Short: Continue to push for more laps on track. Long: Continue to increase overall MET level.             Discharge Exercise Prescription (Final Exercise Prescription Changes):  Exercise Prescription Changes - 10/05/21 1500       Response to Exercise   Blood Pressure (Admit) 112/76    Blood Pressure (Exit) 118/60    Heart Rate (Admit) 77 bpm    Heart Rate (Exercise) 106 bpm    Heart Rate (Exit) 85 bpm    Oxygen Saturation (Admit) 98 %    Oxygen Saturation (Exercise) 88 %    Oxygen Saturation (Exit) 96 %    Rating of Perceived Exertion (Exercise) 14    Perceived Dyspnea (Exercise) 2    Symptoms SOB    Duration Continue with 30 min of aerobic exercise without signs/symptoms of physical distress.    Intensity THRR unchanged      Progression   Progression Continue to progress workloads to maintain intensity without signs/symptoms of physical distress.    Average METs 2.14      Resistance Training   Training Prescription Yes    Weight 6 lb    Reps 10-15      Interval Training   Interval Training No      Oxygen   Oxygen Continuous    Liters 4      Recumbant Bike   Level 6    Watts 39    Minutes 15    METs 2.78      NuStep   Level  5    Minutes 15    METs 2.1      REL-XR   Level 1    Minutes 15      Track   Laps 10    Minutes 15    METs 1.54      Home Exercise Plan   Plans to continue exercise at Home (comment)   walking, staff videos   Frequency Add 2 additional days to program exercise sessions.    Initial Home Exercises Provided 09/09/21      Oxygen   Maintain Oxygen Saturation 88% or higher             Nutrition:  Target Goals: Understanding of nutrition guidelines, daily intake of sodium <1584m, cholesterol <2073m calories 30% from fat and 7% or less from saturated fats, daily to have 5 or more servings of fruits and vegetables.  Education: All About Nutrition: -Group instruction provided by  verbal, written material, interactive activities, discussions, models, and posters to present general guidelines for heart healthy nutrition including fat, fiber, MyPlate, the role of sodium in heart healthy nutrition, utilization of the nutrition label, and utilization of this knowledge for meal planning. Follow up email sent as well. Written material given at graduation. Flowsheet Row Pulmonary Rehab from 09/22/2021 in [facility]  Date 09/22/21  Educator [facility]  Instruction Review Code 1- Verbalizes Understanding       Biometrics:  Pre Biometrics - 07/14/21 1300       Pre Biometrics   Height 6' 0.5" (1.842 m)    Weight 209 lb 12.8 oz (95.2 kg)    BMI (Calculated) 28.05    Single Leg Stand 9 seconds              Nutrition Therapy Plan and Nutrition Goals:  Nutrition Therapy & Goals - 09/29/21 1544       Nutrition Therapy   Diet Heart healthy, low Na, pulmonary MNT    Drug/Food Interactions Statins/Certain Fruits    Protein (specify units) 120g    Fiber 30 grams    Whole Grain Foods 3 servings    Saturated Fats 16 max. grams    Fruits and Vegetables 8 servings/day    Sodium 2 grams      Personal Nutrition Goals   Nutrition Goal ST: practice MyPlate guidelines   LT: meet calorie and protein needs while eating a heart healthy diet    Comments 71 y.o. M admitted to pulmonary rehab for ILD. PMHx includes CAD, pulmonary embolism, HTN, CAD, malignant neoplasm of R lung, pulmonary fibrosis. Relevant medications includes xanax, lipitor, vit D3, norco, protonic, paxil, prednisone, probiotic. PYP Score: 40. Vegetables & Fruits 5/12. Breads, Grains & Cereals 5/12. Red & Processed Meat 4/12. Poultry 0/2. Fish & Shellfish 2/4. Beans, Nuts & Seeds 3/4. Milk & Dairy Foods 2/6. Toppings, Oils, Seasonings & Salt 8/20. Sweets, Snacks & Restaurant Food 4/14. Beverages 7/10. He feels fatigued a lot of the time; he feels this is due to his shortness of breath. He feels that his appetite is lower than his baseline; has been that way for a couple of months and feels it is because of his [place]. B: Boost with cliff bar or sometimes a couple eggs with sausage and toast. S: fruit, Belvita, or cliff bar (grazing) D: TV dinners 1-2x/week or protein (red meat: grilled chuck and grilled steaks, chicken and fish less often) and vegetables (squash, cucumber, potatoes, broccoli) S: belvita. He reports that he is the one to normally cook dinner - he uses vegetable oil, he uses salt, but has cut back. Drinks: iced tea (slightly sweet tea -pure leaf), water with flavoring, diet soda (caffiene free). Bill sat in on nutrition education last week and reports he has no questions at this time; reviewed heart healthy eating and MyPlate structure. Discussed pulmonary MNT and the importance of meeting energy and protein needs as well as vitamin D and calcium while on steroids. He feels that his biggest barrier is cooking the food he has; he reports buying all the right food, but not cooking a lot of it due to fatigue. Discussed meal planning, ingredient prepping, and frozen vegetables. He is open to making changes.      Intervention Plan   Intervention Prescribe, educate and counsel regarding individualized  specific dietary modifications aiming towards targeted core components such as weight, hypertension, lipid management, diabetes, heart failure and other comorbidities.    Expected  Outcomes Short Term Goal: Understand basic principles of dietary content, such as calories, fat, sodium, cholesterol and nutrients.;Short Term Goal: A plan has been developed with personal nutrition goals set during dietitian appointment.;Long Term Goal: Adherence to prescribed nutrition plan.             Nutrition Assessments:  MEDIFICTS Score Key: ?70 Need to make dietary changes  40-70 Heart Healthy Diet ? 40 Therapeutic Level Cholesterol Diet  Flowsheet Row Pulmonary Rehab from 07/14/2021 in [facility]  Picture Your Plate Total Score on Admission 40      Picture Your Plate Scores: <35 Unhealthy dietary pattern with much room for improvement. 41-50 Dietary pattern unlikely to meet recommendations for good health and room for improvement. 51-60 More healthful dietary pattern, with some room for improvement.  >60 Healthy dietary pattern, although there may be some specific behaviors that could be improved.   Nutrition Goals Re-Evaluation:  Nutrition Goals Re-Evaluation     [place] Name 07/22/21 1546 08/23/21 1628 09/09/21 1540         Goals   Nutrition Goal Meet with the dietitian. -- Resheduled nutrition appt. for 09/21/21 at [place] did mention that he was having difficulty with waking up hungry in the middle of the night - discussed that it could be he is not meeting his needs during the day, encouraged to pay attention to hunger cues. Discussed heart healhty snacking.     Comment Bill would like to meet with the dietitian. Resheduled nutrition appt. for 09/06/21 at 3pm. Resheduled nutrition appt. for 09/21/21 at [place] did mention that he was having difficulty with waking up hungry in the middle of the night - discussed that it could be he is not meeting his needs during the  day, encouraged to pay attention to hunger cues. Discussed heart healhty snacking.     Expected Outcome Short: meet with the dietitian. Long: adhere to a diet that pertains to him. -- --              Nutrition Goals Discharge (Final Nutrition Goals Re-Evaluation):  Nutrition Goals Re-Evaluation - 09/09/21 1540       Goals   Nutrition Goal Resheduled nutrition appt. for 09/21/21 at [place] did mention that he was having difficulty with waking up hungry in the middle of the night - discussed that it could be he is not meeting his needs during the day, encouraged to pay attention to hunger cues. Discussed heart healhty snacking.    Comment Resheduled nutrition appt. for 09/21/21 at [place] did mention that he was having difficulty with waking up hungry in the middle of the night - discussed that it could be he is not meeting his needs during the day, encouraged to pay attention to hunger cues. Discussed heart healhty snacking.             Psychosocial: Target Goals: Acknowledge presence or absence of significant depression and/or stress, maximize coping skills, provide positive support system. Participant is able to verbalize types and ability to use techniques and skills needed for reducing stress and depression.   Education: Stress, Anxiety, and Depression - Group verbal and visual presentation to define topics covered.  Reviews how body is impacted by stress, anxiety, and depression.  Also discusses healthy ways to reduce stress and to treat/manage anxiety and depression.  Written material given at graduation.   Education: Sleep Hygiene -Provides group verbal and written instruction about how sleep can affect your health.  Define sleep hygiene, discuss sleep cycles and impact of sleep habits. Review good sleep hygiene tips.    Initial Review & Psychosocial Screening:  Initial Psych Review & Screening - 07/05/21 1144       Initial Review   Current issues with Current  Anxiety/Panic   occassional anxiety has Xanax for this     Family Dynamics   Good Support System? Yes   wife, 43 years     Barriers   Psychosocial barriers to participate in program There are no identifiable barriers or psychosocial needs.      Screening Interventions   Interventions Encouraged to exercise    Expected Outcomes Short Term goal: Utilizing psychosocial counselor, staff and physician to assist with identification of specific Stressors or current issues interfering with healing process. Setting desired goal for each stressor or current issue identified.;Long Term Goal: Stressors or current issues are controlled or eliminated.;Short Term goal: Identification and review with participant of any Quality of Life or Depression concerns found by scoring the questionnaire.;Long Term goal: The participant improves quality of Life and PHQ9 Scores as seen by post scores and/or verbalization of changes             Quality of Life Scores:  Scores of 19 and below usually indicate a poorer quality of life in these areas.  A difference of  2-3 points is a clinically meaningful difference.  A difference of 2-3 points in the total score of the Quality of Life Index has been associated with significant improvement in overall quality of life, self-image, physical symptoms, and general health in studies assessing change in quality of life.  PHQ-9: Review Flowsheet  More data exists      07/14/2021 06/01/2016 12/30/2015 11/18/2015 10/13/2015  Depression screen PHQ 2/9  Decreased Interest 1 0 0 - 0  Down, Depressed, Hopeless 1 0 0 0 0  PHQ - 2 Score 2 0 0 0 0  Altered sleeping 2 - - - -  Tired, decreased energy 2 - - - -  Change in appetite 2 - - - -  Feeling bad or failure about yourself  1 - - - -  Trouble concentrating 1 - - - -  Moving slowly or fidgety/restless 0 - - - -  Suicidal thoughts 0 - - - -  PHQ-9 Score 10 - - - -  Difficult doing work/chores Somewhat difficult - - - -    Interpretation of Total Score  Total Score Depression Severity:  1-4 = Minimal depression, 5-9 = Mild depression, 10-14 = Moderate depression, 15-19 = Moderately severe depression, 20-27 = Severe depression   Psychosocial Evaluation and Intervention:  Psychosocial Evaluation - 07/05/21 1159       Psychosocial Evaluation & Interventions   Interventions Encouraged to exercise with the program and follow exercise prescription;Relaxation education    Comments [name] has no barriers to attending the program. He is looking forward to regaining his strength and stamina. 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 He lives with his wife and their fur babies. She is his support. THey havebeen married for 43 years. He wants to get back to his previou strength. He does have meds as needed for anxiety. He does not need the medicine too frequently.  He has lost over 20 pounds after his surgery. Heis ready to see if he can gain back energy and musclemass he has lost    Expected Outcomes STG  [name] is able to attend all scheduled sessions, He continues to remain tobacco free.   LTG BIll continues to have progressive improvement with his energy and strength, He uses his anxiety meds even less after discharge    Continue Psychosocial Services  Follow up required by staff             Psychosocial Re-Evaluation:  Psychosocial Re-Evaluation     [place] Name 07/22/21 1547 08/23/21 1608 09/09/21 1554         Psychosocial Re-Evaluation   Current issues with Current Depression;History of Depression;Current Psychotropic Meds Current Depression;History of Depression;Current Psychotropic Meds;Current Sleep Concerns Current Depression;History of Depression;Current Psychotropic Meds;Current Sleep Concerns     Comments He states that his mood has improved since he is over the effects of his lung surgery. It took him 3 months to feel better. His mood with him being able to  exercise and do more has helped improve his mood dramatically. He has some properties to take care of and wants to do rehab two days a week. He reports his mood has been better with no depressive symptoms recently - he thinks the vacation helped! He went to beach with his wife, nephews, sister-in-law. He went fishing as well and is excited about a fishing trip he is taking in September. He is still taking medication as needed and feels it is helping. He can rely on his wife for support. He loves his dogs, friends and family. He will think of something else when his mind gets stressed. He reports not sleeping well - he sleeps in the same bed as one of the dogs and some of them will wake him up. He reports his mood has been better with no depressive symptoms recently. He reports even other people has noticed as well and especially his wife. He enjoys fishing and is excited about a fishing trip he is taking in September, he is alos excited to bow hunt and go walking outside. He is still taking medication as needed and feels it is helping. He can rely on his wife for support. He loves his dogs, friends and family. He will think of something else when his mind gets stressed. He reports not sleeping well, but it has been getting better due to physical activity - he sleeps in the same bed as one of the dogs and some of them will wake him up.     Expected Outcomes Short: Start [facility] to help with mood. Long: Maintain a healthy mental state Short: Continue to attend [facility] to help with mood. Long: Maintain a healthy mental state Short: Continue to attend [facility] to help with mood. Long: Maintain a healthy mental state     Interventions Encouraged to attend Pulmonary Rehabilitation for the exercise Encouraged to attend Pulmonary Rehabilitation for the exercise Encouraged to attend Pulmonary Rehabilitation for the exercise     Continue Psychosocial Services  Follow up required by staff Follow up required by staff  Follow up required by staff              Psychosocial Discharge (Final Psychosocial Re-Evaluation):  Psychosocial Re-Evaluation - 09/09/21 1554       Psychosocial Re-Evaluation   Current issues with Current Depression;History of Depression;Current Psychotropic Meds;Current Sleep Concerns    Comments He reports his mood has been better with no depressive symptoms recently. He reports even other people has noticed as well and especially his wife. He enjoys fishing and is excited about a fishing trip he is taking  in September, he is alos excited to bow hunt and go walking outside. He is still taking medication as needed and feels it is helping. He can rely on his wife for support. He loves his dogs, friends and family. He will think of something else when his mind gets stressed. He reports not sleeping well, but it has been getting better due to physical activity - he sleeps in the same bed as one of the dogs and some of them will wake him up.    Expected Outcomes Short: Continue to attend [facility] to help with mood. Long: Maintain a healthy mental state    Interventions Encouraged to attend Pulmonary Rehabilitation for the exercise    Continue Psychosocial Services  Follow up required by staff             Education: Education Goals: Education classes will be provided on a weekly basis, covering required topics. Participant will state understanding/return demonstration of topics presented.  Learning Barriers/Preferences:  Learning Barriers/Preferences - 07/05/21 1158       Learning Barriers/Preferences   Learning Barriers Hearing    Learning Preferences None             General Pulmonary Education Topics:  Infection Prevention: - Provides verbal and written material to individual with discussion of infection control including proper hand washing and proper equipment cleaning during exercise session. Flowsheet Row Pulmonary Rehab from 09/22/2021 in [facility] Cardiac and Pulmonary  Rehab  Education need identified 07/14/21  Date 07/14/21  Educator Allen  Instruction Review Code 1- Verbalizes Understanding       Falls Prevention: - Provides verbal and written material to individual with discussion of falls prevention and safety. Flowsheet Row Pulmonary Rehab from 07/05/2021 in [facility] Cardiac and Pulmonary Rehab  Date 07/05/21  Educator SB  Instruction Review Code 1- Verbalizes Understanding       Chronic Lung Disease Review: - Group verbal instruction with posters, models, PowerPoint presentations and videos,  to review new updates, new respiratory medications, new advancements in procedures and treatments. Providing information on websites and "800" numbers for continued self-education. Includes information about supplement oxygen, available portable oxygen systems, continuous and intermittent flow rates, oxygen safety, concentrators, and Medicare reimbursement for oxygen. Explanation of Pulmonary Drugs, including class, frequency, complications, importance of spacers, rinsing mouth after steroid MDI's, and proper cleaning methods for nebulizers. Review of basic lung anatomy and physiology related to function, structure, and complications of lung disease. Review of risk factors. Discussion about methods for diagnosing sleep apnea and types of masks and machines for OSA. Includes a review of the use of types of environmental controls: home humidity, furnaces, filters, dust mite/pet prevention, HEPA vacuums. Discussion about weather changes, air quality and the benefits of nasal washing. Instruction on Warning signs, infection symptoms, calling MD promptly, preventive modes, and value of vaccinations. Review of effective airway clearance, coughing and/or vibration techniques. Emphasizing that all should Create an Action Plan. Written material given at graduation. Flowsheet Row Pulmonary Rehab from 09/22/2021 in [facility] Cardiac and Pulmonary Rehab  Education need identified 07/14/21        AED/CPR: - Group verbal and written instruction with the use of models to demonstrate the basic use of the AED with the basic ABC's of resuscitation.    Anatomy and Cardiac Procedures: - Group verbal and visual presentation and models provide information about basic cardiac anatomy and function. Reviews the testing methods done to diagnose heart disease and the outcomes of the test results. Describes the treatment  choices: Medical Management, Angioplasty, or Coronary Bypass Surgery for treating various heart conditions including Myocardial Infarction, Angina, Valve Disease, and Cardiac Arrhythmias.  Written material given at graduation.   Medication Safety: - Group verbal and visual instruction to review commonly prescribed medications for heart and lung disease. Reviews the medication, class of the drug, and side effects. Includes the steps to properly store meds and maintain the prescription regimen.  Written material given at graduation.   Other: -Provides group and verbal instruction on various topics (see comments)   Knowledge Questionnaire Score:  Knowledge Questionnaire Score - 07/14/21 1227       Knowledge Questionnaire Score   Pre Score 15/18              Core Components/Risk Factors/Patient Goals at Admission:  Personal Goals and Risk Factors at Admission - 07/14/21 1309       Core Components/Risk Factors/Patient Goals on Admission    Weight Management Yes;Weight Loss   Patient lost 25 pounds since Jan 2023, wants to gain muscle   Intervention Weight Management: Develop a combined nutrition and exercise program designed to reach desired caloric intake, while maintaining appropriate intake of nutrient and fiber, sodium and fats, and appropriate energy expenditure required for the weight goal.;Weight Management: Provide education and appropriate resources to help participant work on and attain dietary goals.;Weight Management/Obesity: Establish reasonable short  term and long term weight goals.    Admit Weight 209 lb (94.8 kg)   was 231 prior to his surgery in Jan 2023   Goal Weight: Short Term 204 lb (92.5 kg)    Goal Weight: Long Term 195 lb (88.5 kg)    Expected Outcomes Short Term: Continue to assess and modify interventions until short term weight is achieved;Weight Loss: Understanding of general recommendations for a balanced deficit meal plan, which promotes 1-2 lb weight loss per week and includes a negative energy balance of [phone] kcal/d;Understanding recommendations for meals to include 15-35% energy as protein, 25-35% energy from fat, 35-60% energy from carbohydrates, less than 218m of dietary cholesterol, 20-35 gm of total fiber daily;Understanding of distribution of calorie intake throughout the day with the consumption of 4-5 meals/snacks;Long Term: Adherence to nutrition and physical activity/exercise program aimed toward attainment of established weight goal    Improve shortness of breath with ADL's Yes    Intervention Provide education, individualized exercise plan and daily activity instruction to help decrease symptoms of SOB with activities of daily living.    Expected Outcomes Short Term: Improve cardiorespiratory fitness to achieve a reduction of symptoms when performing ADLs    Increase knowledge of respiratory medications and ability to use respiratory devices properly  Yes    Intervention Provide education and demonstration as needed of appropriate use of medications, inhalers, and oxygen therapy.    Expected Outcomes Short Term: Achieves understanding of medications use. Understands that oxygen is a medication prescribed by physician. Demonstrates appropriate use of inhaler and oxygen therapy.;Long Term: Maintain appropriate use of medications, inhalers, and oxygen therapy.    Hypertension Yes    Intervention Provide education on lifestyle modifcations including regular physical activity/exercise, weight management, moderate sodium  restriction and increased consumption of fresh fruit, vegetables, and low fat dairy, alcohol moderation, and smoking cessation.;Monitor prescription use compliance.    Expected Outcomes Short Term: Continued assessment and intervention until BP is < 140/966mHG in hypertensive participants. < 130/8083mG in hypertensive participants with diabetes, heart failure or chronic kidney disease.;Long Term: Maintenance of blood pressure at goal levels.  Lipids Yes    Intervention Provide education and support for participant on nutrition & aerobic/resistive exercise along with prescribed medications to achieve LDL <97m, HDL >423m    Expected Outcomes Short Term: Participant states understanding of desired cholesterol values and is compliant with medications prescribed. Participant is following exercise prescription and nutrition guidelines.;Long Term: Cholesterol controlled with medications as prescribed, with individualized exercise RX and with personalized nutrition plan. Value goals: LDL < 7059mHDL > 40 mg.             Education:Diabetes - Individual verbal and written instruction to review signs/symptoms of diabetes, desired ranges of glucose level fasting, after meals and with exercise. Acknowledge that pre and post exercise glucose checks will be done for 3 sessions at entry of program.   Know Your Numbers and Heart Failure: - Group verbal and visual instruction to discuss disease risk factors for cardiac and pulmonary disease and treatment options.  Reviews associated critical values for Overweight/Obesity, Hypertension, Cholesterol, and Diabetes.  Discusses basics of heart failure: signs/symptoms and treatments.  Introduces Heart Failure Zone chart for action plan for heart failure.  Written material given at graduation.   Core Components/Risk Factors/Patient Goals Review:   Goals and Risk Factor Review     Row Name 07/22/21 1545 08/23/21 1603 09/09/21 1541         Core Components/Risk  Factors/Patient Goals Review   Personal Goals Review Improve shortness of breath with ADL's Improve shortness of breath with ADL's Improve shortness of breath with ADL's;Weight Management/Obesity;Hypertension     Review Spoke to patient about their shortness of breath and what they can do to improve. Patient has been informed of breathing techniques when starting the program. Patient is informed to tell staff if they have had any med changes and that certain meds they are taking or not taking can be causing shortness of breath. Bill reports his shortness of breath has been improving since starting rehab. He checks his O2 all the time and keeps a pulse oximeter on him - he makes sure his O2 does not dip below 88. He practices PLB when he gets short of breath. He also practices diaphragmatic breathing laying down. His weight was elevated 6lbs since 2 weeks ago - he went on vacation and ate out a lot. He is taking all of his medications as prescribed. Only issue with Esbriet which is costly and is giving him difficulty with his memory. Encouraged him to make MD aware if he hasn't already. Bill reports his weight is back up, 6 pounds in less than a week. He reports not being on a diuretic right now, he did have more salt that weekend, but does not have additional swelling or shortness of breath; encouraged him to let his doctor know as this could be fluid buildup. Bill reports his shortness of breath has gotten worse about a month ago. He will take breaks as needed when performing ADLs; he has two pulse oximeters which he uses - he knows his O2 should stay above 88%. He continues to practice diaphragmatic breathing. He continues to use PLB when he has shortness of breath and feels this helps. Bill reports checking his BP and reports the top number can get high 140s/150s over 80 or so. Today his BP was 112/66. He continues to take his medications as dirested with no issues.     Expected Outcomes Short: Attend  [facility] regularly to improve shortness of breath with ADL's. Long: maintain independence with ADL's Short: Attend  [facility] regularly to improve shortness of breath with ADL's. Long: maintain independence with ADL's Short: Attend [facility] regularly to improve shortness of breath with ADL's. Long: maintain independence with ADL's              Core Components/Risk Factors/Patient Goals at Discharge (Final Review):   Goals and Risk Factor Review - 09/09/21 1541       Core Components/Risk Factors/Patient Goals Review   Personal Goals Review Improve shortness of breath with ADL's;Weight Management/Obesity;Hypertension    Review Bill reports his weight is back up, 6 pounds in less than a week. He reports not being on a diuretic right now, he did have more salt that weekend, but does not have additional swelling or shortness of breath; encouraged him to let his doctor know as this could be fluid buildup. Bill reports his shortness of breath has gotten worse about a month ago. He will take breaks as needed when performing ADLs; he has two pulse oximeters which he uses - he knows his O2 should stay above 88%. He continues to practice diaphragmatic breathing. He continues to use PLB when he has shortness of breath and feels this helps. Bill reports checking his BP and reports the top number can get high 140s/150s over 80 or so. Today his BP was 112/66. He continues to take his medications as dirested with no issues.    Expected Outcomes Short: Attend [facility] regularly to improve shortness of breath with ADL's. Long: maintain independence with ADL's             ITP Comments:  ITP Comments     Row Name 07/05/21 1155 07/14/21 1225 07/19/21 1532 08/11/21 1133 09/08/21 0755   ITP Comments Virtual orientation call completed today. he has an appointment on Date: 05/17/20253  for EP eval and gym Orientation.  Documentation of diagnosis can be found in [facility]  Date: 06/17/2021 .      Zamarion is a former  tobacco user. Intervention for tobacco cessation was provided at the initial medical review. He was asked about  quitting and reported he quit all in Nov 2022 . Staff will continue to provide encouragement and follow up with the patient throughout the program to prevent relapse. Completed 6MWT and gym orientation. Initial ITP created and sent for review to Dr. Ottie Glazier, Medical Director. First full day of exercise!  Patient was oriented to gym and equipment including functions, settings, policies, and procedures.  Patient's individual exercise prescription and treatment plan were reviewed.  All starting workloads were established based on the results of the 6 minute walk test done at initial orientation visit.  The plan for exercise progression was also introduced and progression will be customized based on patient's performance and goals. 30 Day review completed. Medical Director ITP review done, changes made as directed, and signed approval by Medical Director. 30 Day review completed. Medical Director ITP review done, changes made as directed, and signed approval by Medical Director.    [place] Name 10/06/21 0700           ITP Comments 30 Day review completed. Medical Director ITP review done, changes made as directed, and signed approval by Medical Director.                Comments:

## 2021-10-06 NOTE — Discharge Instructions (Signed)
Call Dr. Thompson Caul office in the AM for close follow up.  Return if you have worsening pain, shortness of breath, weakness or for any other questions or concerns.

## 2021-10-06 NOTE — ED Provider Notes (Signed)
[facility] Provider Note    Event Date/Time   First MD Initiated Contact with Patient 10/06/21 2045     (approximate)   History   Chest Pain   HPI  Richard Parrish is a 71 y.o. male extensive past medical history of both cardiac disease as well as pulmonary fibrosis on 5 L nasal cannula chronically presents to the ER for evaluation episode of chest discomfort and tightness that occurred while he was at pulmonary rehab.  He states over the past few days and weeks has been having more frequent episodes of this.  Feels like his breathing is at his baseline.  He is on chronic steroids and they are currently trying to wean him off.  Does not feel significantly short of breath at rest.  Denies any fevers no cough or congestion.  No diaphoresis.  States that they did recently take him off Imdur.  States the symptoms do feel somewhat similar to his chronic angina.  They do get better after nitro.     Physical Exam   Triage Vital Signs: ED Triage Vitals  Enc Vitals Group     BP 10/06/21 1749 (!) 138/94     Pulse Rate 10/06/21 1749 75     Resp 10/06/21 1749 17     Temp 10/06/21 1749 (!) 97.5 F (36.4 C)     Temp Source 10/06/21 1749 Oral     SpO2 10/06/21 1749 100 %     Weight 10/06/21 1756 220 lb (99.8 kg)     Height 10/06/21 1756 6' (1.829 m)     Head Circumference --      Peak Flow --      Pain Score 10/06/21 1756 3     Pain Loc --      Pain Edu? --      Excl. in [place]? --     Most recent vital signs: Vitals:   10/06/21 1749 10/06/21 2130  BP: (!) 138/94 (!) 160/98  Pulse: 75 70  Resp: 17 17  Temp: (!) 97.5 F (36.4 C)   SpO2: 100% 100%     Constitutional: Alert  Eyes: Conjunctivae are normal.  Head: Atraumatic. Nose: No congestion/rhinnorhea. Mouth/Throat: Mucous membranes are moist.   Neck: Painless ROM.  Cardiovascular:   Good peripheral circulation.  No m/g/r Respiratory: Mild tachypnea with inspiratory crackles. Gastrointestinal:  Soft and nontender.  Musculoskeletal:  no deformity Neurologic:  MAE spontaneously. No gross focal neurologic deficits are appreciated.  Skin:  Skin is warm, dry and intact. No rash noted. Psychiatric: Mood and affect are normal. Speech and behavior are normal.    ED Results / Procedures / Treatments   Labs (all labs ordered are listed, but only abnormal results are displayed) Labs Reviewed  BASIC METABOLIC PANEL - Abnormal; Notable for the following components:      Result Value   Glucose, Bld 115 (*)    All other components within normal limits  CBC  TROPONIN I (HIGH SENSITIVITY)  TROPONIN I (HIGH SENSITIVITY)     EKG ED ECG REPORT I, Merlyn Lot, the attending physician, personally viewed and interpreted this ECG.   Date: 10/06/2021  EKG Time: 17:46  Rate: 70  Rhythm: sinus  Axis: normal  Intervals: normal intervals  ST&T Change: no stemi, nonspecific inferolateral st abn     RADIOLOGY Please see ED Course for my review and interpretation.  I personally reviewed all radiographic images ordered to evaluate for the above acute complaints and reviewed  radiology reports and findings.  These findings were personally discussed with the patient.  Please see medical record for radiology report.    PROCEDURES:  Critical Care performed: No  Procedures   MEDICATIONS ORDERED IN ED: Medications - No data to display   IMPRESSION / MDM / [place] / ED COURSE  I reviewed the triage vital signs and the nursing notes.                              Differential diagnosis includes, but is not limited to, ACS, pericarditis, esophagitis, boerhaaves, pe, dissection, pna, bronchitis, costochondritis  Patient presented to the ER for evaluation of symptoms as described above.  This presenting complaint could reflect a potentially life-threatening illness therefore the patient will be placed on continuous pulse oximetry and telemetry for monitoring.  Laboratory  evaluation will be sent to evaluate for the above complaints.  Chest x-ray ordered for the above differential my review and interpretation does not show any evidence of pneumothorax or consolidation.  Per radiology report has stable findings consistent with pulmonary fibrosis.  He does not have any acute hypoxia.  States that his shortness of breath is at his baseline.  He is already on prednisone.  Initial troponin is negative.  His EKG is nonischemic.  Given his history will observe.  Consistent with PE he is on Eliquis.  Not consistent with dissection.  His abdominal exam is soft and benign.   Clinical Course as of 10/06/21 2247  Wed Oct 06, 2021  2246 Repeat troponin is negative.  Patient reassessed.  He is 100% on his baseline O2.  Does not seem consistent with ACS.  Discussed in with the family seems like a lot of the symptoms started occurring more frequently after he stopped taking his isosorbide therefore we discussed reinitiating this medication at baseline.  Is very close follow-up scheduled with cardiology.  We discussed option for hospitalization for further monitoring patient and family feel comfortable with close outpatient follow-up and I think that is reasonable.  We discussed strict return precautions.  They are agreeable to plan. [PR]    Clinical Course User Index [PR] Merlyn Lot, MD    FINAL CLINICAL IMPRESSION(S) / ED DIAGNOSES   Final diagnoses:  Atypical chest pain     Rx / DC Orders   ED Discharge Orders     None        Note:  This document was prepared using Dragon voice recognition software and may include unintentional dictation errors.    Merlyn Lot, MD 10/06/21 2248

## 2021-10-06 NOTE — ED Notes (Signed)
Pts O2 tank replaced with full canister

## 2021-10-06 NOTE — ED Notes (Signed)
Sent rainbow to lab. 

## 2021-10-06 NOTE — ED Notes (Signed)
Pt presents to ED with c/o chest pain. Pt states he started to have a dull chest pain in the center of his chest that radiates to his back and right shoulder. Pt denies any chest pain at this time. Pt states he has had multiple stents placed in the past. Pt wears 5L [place] at baseline at home. Pt talking in complete sentences at this time, pt NAD at this time.

## 2021-10-06 NOTE — ED Notes (Signed)
Pt verbalized understanding of discharge instructions and follow-up care instructions. Pt advised if symptoms worsen to return to ED.  

## 2021-10-06 NOTE — ED Provider Triage Note (Signed)
  Emergency Medicine Provider Triage Evaluation Note  Richard Parrish , a 71 y.o.male,  was evaluated in triage.  Pt complains of intermittent central chest pain rating into the back over the past week and a half.  He has been using nitroglycerin as needed for the pain, which he says improves his comfort.   Review of Systems  Positive: Chest pain Negative: Denies fever, abdominal pain, vomiting  Physical Exam   Vitals:   10/06/21 1749  BP: (!) 138/94  Pulse: 75  Resp: 17  Temp: (!) 97.5 F (36.4 C)  SpO2: 100%   Gen:   Awake, no distress   Resp:  Normal effort  MSK:   Moves extremities without difficulty  Other:    Medical Decision Making  Given the patient's initial medical screening exam, the following diagnostic evaluation has been ordered. The patient will be placed in the appropriate treatment space, once one is available, to complete the evaluation and treatment. I have discussed the plan of care with the patient and I have advised the patient that an ED physician or mid-level practitioner will reevaluate their condition after the test results have been received, as the results may give them additional insight into the type of treatment they may need.    Diagnostics: Labs, EKG, CXR  Treatments: none immediately   Teodoro Spray, [place] 10/06/21 1948

## 2021-10-07 ENCOUNTER — Telehealth: Payer: Self-pay | Admitting: Interventional Cardiology

## 2021-10-07 ENCOUNTER — Telehealth: Payer: Self-pay

## 2021-10-07 NOTE — Telephone Encounter (Signed)
Attempted to call patient to follow up from ED visit yesterday. Left message. He has follow up with cardiology on 8/14.

## 2021-10-07 NOTE — Telephone Encounter (Signed)
Pt c/o medication issue:  1. Name of Medication: isosorbide  2. How are you currently taking this medication (dosage and times per day)?   3. Are you having a reaction (difficulty breathing--STAT)?   4. What is your medication issue? Wife called stating patient was in the ED last night for chest pain, they doctor at the ED restarted him on isosorbide.  She wants to make sure it's okay for him to restart it as Dr. Tamala Julian had taken him off of the medication.

## 2021-10-07 NOTE — Telephone Encounter (Signed)
Called patient's wife back. She stated patient was started back on Imdur after going to the ED last night and wanted to know if it is okay. Informed patient, after reviewing the chart, that it should be fine. Informed her of a previous message 3 months ago discussing the same medications and her was Dr. Thompson Caul advisement.  Per Dr. Tamala Julian, "As long as he is not having angina off isosorbide, it is okay to leave it off.  Let us know if recurrent angina requiring nitroglycerin. "   Patient's wife stated she would have patient continue to take it for now. She said they would talk more when they come in for their visit next week.

## 2021-10-10 NOTE — Progress Notes (Deleted)
Cardiology Office Note:    Date:  10/10/2021   ID:  DOLTON SHAKER, DOB 09/24/1950, MRN [id number]  PCP:  Seward Carol, [place] Providers Cardiologist:  Sinclair Grooms, MD { Click to update primary MD,subspecialty MD or APP then REFRESH:1}    Referring MD: Seward Carol, MD   No chief complaint on file. ***  History of Present Illness:    Richard Parrish is a 71 y.o. 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  Left heart cath in August 2022 showed severe single-vessel CAD with ostial CTO of RCA.  Mild luminal irregularities were seen in the left coronary artery.  Patent SVG-RDA with patent mid graft stent and moderate proximal/mid graft disease, this was stable for very minimally worse compared to the previous 2012 cath, treated medically.  Last seen in the cardiology office on November 20, 2020 and was doing well from a cardiac perspective.  Denied any anginal symptoms. BP well-controlled.    Presented to the [facility] ED on October 06, 2021 for chest discomfort and tightness that occurred while at pulmonary rehab.  He denied feeling significantly short of breath, well as denied diaphoresis, cough, fevers, congestion.  Stated these frequent  episodes were occurring over the past few days and weeks, his breathing was at his baseline, on 5 L of oxygen via nasal cannula.  Lead EKG in ED revealed this rhythm, PAC, nonspecific ST changes.  BMP and CBC unremarkable.  Troponin normal.  Two-view chest x-ray showed stable severe pulmonary fibrosis, no definite acute consolidation seen.  He did not have any acute hypoxia, discharged later that day.  He presents for hospital follow-up. States ***    Past Medical History:  Diagnosis Date   Anxiety    Arthritis    "lower back" (04/07/2017)   BPH (benign prostatic hypertrophy)    C. difficile diarrhea    Chronic lower back pain    Coronary artery disease 1989   cabg with dvg to rca    Depression    GERD (gastroesophageal reflux disease)    Hyperlipidemia    Hypertension    Insomnia    Myocardial infarction ([place]) 2002   OSA on CPAP    cpap setting of 60 per pt   Osteoarthritis    "hips" (04/07/2017)   PAD (peripheral artery disease) ([place])    Peripheral vascular disease ([place])    Pneumonia 1989   S/P CABG   Pulmonary embolism ([place]) 05/18/2021   Pulmonary fibrosis ([place])    "one time" (04/07/2017)   Spinal stenosis     Past Surgical History:  Procedure Laterality Date   ABDOMINAL AORTAGRAM N/A 03/15/2011   Procedure: ABDOMINAL Maxcine Ham;  Surgeon: Serafina Mitchell, MD;  Location: [facility] CATH LAB;  Service: Cardiovascular;  Laterality: N/A;   ABDOMINAL AORTAGRAM N/A  04/12/2011   Procedure: ABDOMINAL Maxcine Ham;  Surgeon: Serafina Mitchell, MD;  Location: [facility] CATH LAB;  Service: Cardiovascular;  Laterality: N/A;   BACK SURGERY     BRONCHIAL BIOPSY  03/03/2021   Procedure: BRONCHIAL BIOPSIES;  Surgeon: Garner Nash, DO;  Location: [place] [facility]COPY;  Service: Pulmonary;;   BRONCHIAL BRUSHINGS  03/03/2021   Procedure: BRONCHIAL BRUSHINGS;  Surgeon: Garner Nash, DO;  Location: [place];  Service: Pulmonary;;   BRONCHIAL NEEDLE ASPIRATION BIOPSY  03/03/2021   Procedure: BRONCHIAL NEEDLE  ASPIRATION BIOPSIES;  Surgeon: Garner Nash, DO;  Location: [place] [facility];  Service: Pulmonary;;   CARDIAC CATHETERIZATION  08/1987   CORONARY ANGIOPLASTY WITH STENT PLACEMENT  ~ 2002   [facility], [place]   CORONARY ARTERY BYPASS GRAFT  09/1987   "CABG X1";  [facility]; [place]; "RCA"   FIDUCIAL MARKER PLACEMENT  03/03/2021   Procedure: FIDUCIAL DYE MARKING;  Surgeon: Garner Nash, DO;  Location: [place] ENDOSCOPY;  Service: Pulmonary;;   INTERCOSTAL NERVE BLOCK  03/03/2021   Procedure: INTERCOSTAL NERVE BLOCK;  Surgeon: Lajuana Matte, MD;  Location: [place];  Service: Thoracic;;   JOINT REPLACEMENT     KNEE ARTHROSCOPY Left    LAPAROSCOPIC CHOLECYSTECTOMY     LYMPH NODE BIOPSY  03/03/2021   Procedure: LYMPH NODE BIOPSY;  Surgeon: Lajuana Matte, MD;  Location: [place];  Service: Thoracic;;   LYMPH NODE DISSECTION  03/03/2021   Procedure: LYMPH NODE DISSECTION;  Surgeon: Lajuana Matte, MD;  Location: [place];  Service: Thoracic;;   PERIPHERAL VASCULAR INTERVENTION Left 2016   "put a stent; right branch of left femoral"   POSTERIOR LUMBAR FUSION  2009   lumbar fusion, S L 5 to L 4   RIGHT/LEFT HEART CATH AND CORONARY/GRAFT ANGIOGRAPHY N/A 10/05/2020   Procedure: RIGHT/LEFT HEART CATH AND CORONARY/GRAFT ANGIOGRAPHY;  Surgeon: Nelva Bush, MD;  Location: [place] CV LAB;  Service: Cardiovascular;  Laterality: N/A;   SHOULDER ARTHROSCOPY W/ ROTATOR CUFF REPAIR Right 2006   SHOULDER ARTHROSCOPY WITH SUBACROMIAL DECOMPRESSION AND OPEN ROTATOR C Left ~ [place]     TOTAL HIP ARTHROPLASTY Right 2012   TOTAL HIP ARTHROPLASTY Left 04/07/2017   TOTAL HIP ARTHROPLASTY Left 04/07/2017   Procedure: LEFT TOTAL HIP ARTHROPLASTY;  Surgeon: Earlie Server, MD;  Location: [place];  Service: Orthopedics;  Laterality: Left;   VIDEO BRONCHOSCOPY WITH RADIAL ENDOBRONCHIAL ULTRASOUND  03/03/2021   Procedure: VIDEO BRONCHOSCOPY WITH RADIAL ENDOBRONCHIAL  ULTRASOUND;  Surgeon: Garner Nash, DO;  Location: [place] [facility]OSCOPY;  Service: Pulmonary;;    Current Medications: No outpatient medications have been marked as taking for the 10/11/21 encounter (Appointment) with Richardson Dopp T, PA-C.     Allergies:   Ambien [zolpidem tartrate]   Social History   Socioeconomic History   Marital status: Married    Spouse name: Not on file   Number of children: Not on file   Years of education: Not on file   Highest education level: Not on file  Occupational History   Occupation: Retired  Tobacco Use   Smoking status: Former    Packs/day: 2.00    Years: 15.00    Total pack years: 30.00    Types: Cigarettes    Quit date: 03/01/1987    Years since quitting: 34.6   Smokeless tobacco: Never   Tobacco comments:    03/02/21-Pt instructed to not vape or drink alcohol for 24 hours prior to surgery.  Vaping Use   Vaping Use: Every day   Last attempt to quit: 12/29/2020  Substance and Sexual Activity   Alcohol use: Yes    Comment: couple drinks/month   Drug use: No   Sexual activity: Not Currently  Other Topics Concern   Not on file  Social History Narrative   ** Merged History Encounter **       Social Determinants of Health   Financial Resource Strain: Not on file  Food Insecurity: Not on file  Transportation Needs: Not on file  Physical Activity: Not on file  Stress: Not on file  Social Connections: Not on file     Family History: The patient's ***family history includes Cancer in his father; Heart disease in his mother.  ROS:   Please see the history of present illness.    *** All other systems reviewed and are negative.  EKGs/Labs/Other Studies Reviewed:    The following studies were reviewed today: ***  EKG:  EKG is *** ordered today.  The ekg ordered today demonstrates ***  Ultrasound ABI with/without TBI on September 20, 2021: Summary:  Right: Resting right ankle-brachial index is within normal range. No  evidence of  significant right lower extremity arterial disease. The right  toe-brachial index is normal.   Left: Resting left ankle-brachial index is within normal range. No  evidence of significant left lower extremity arterial disease. The left  toe-brachial index is normal.   Vascular ultrasound lower extremity arterial duplex on September 20, 2021: Patent stent with no evidence of stenosis in the superficial femoral artery.  Elevated velocities in the deep femoral artery suggesting 50 to 74% stenosis.   2D echocardiogram on May 19, 2021: LVEF 60 to 65%.  Right ventricle size is mildly enlarged.  Small pericardial effusion is present.  There is no evidence of cardiac tamponade.  Mitral valve is grossly normal.  No evidence of mitral valve regurgitation or evidence of mitral valve stenosis.  All other findings normal.   Right and left heart cath on October 05, 2020: Conclusions: Severe single-vessel coronary artery disease with chronic total occlusion of the ostial RCA.  No significant disease noted in the left coronary artery. Patent SVG-RCA with patent mid-graft stent and moderate proximal/mid graft disease that is stable or minimally worse compared to prior catheterization in 2012. Normal left and right heart filling pressures. Low normal Fick cardiac output/index.   Recommendations: Continue aggressive secondary prevention. Escalate medical therapy and consider further evaluation for alternative causes of shortness of breath and chest pain.   Recent Labs: 05/18/2021: B Natriuretic Peptide 179.1 05/26/2021: Magnesium 2.0 09/17/2021: ALT 21 10/06/2021: BUN 14; Creatinine, Ser 0.88; Hemoglobin 14.0; Platelets 335; Potassium 4.1; Sodium 139  Recent Lipid Panel    Component Value Date/Time   CHOL 97 10/04/2020 0235   CHOL 131 02/19/2020 0820   TRIG 46 10/04/2020 0235   HDL 35 (L) 10/04/2020 0235   HDL 45 02/19/2020 0820   CHOLHDL 2.8 10/04/2020 0235   VLDL 9 10/04/2020 0235   LDLCALC 53  10/04/2020 0235   LDLCALC 73 02/19/2020 0820     Risk Assessment/Calculations:   {Does this patient have ATRIAL FIBRILLATION?:[phone]}       Physical Exam:    VS:  There were no vitals taken for this visit.    Wt Readings from Last 3 Encounters:  10/06/21 220 lb (99.8 kg)  09/24/21 219 lb 12.8 oz (99.7 kg)  09/20/21 214 lb (97.1 kg)     GEN: *** Well  nourished, well developed in no acute distress HEENT: Normal NECK: No JVD; No carotid bruits LYMPHATICS: No lymphadenopathy CARDIAC: ***RRR, no murmurs, rubs, gallops RESPIRATORY:  Clear to auscultation without rales, wheezing or rhonchi  ABDOMEN: Soft, non-tender, non-distended MUSCULOSKELETAL:  No edema; No deformity  SKIN: Warm and dry NEUROLOGIC:  Alert and oriented x 3 PSYCHIATRIC:  Normal affect   ASSESSMENT:    No diagnosis found. PLAN:    In order of problems listed above:  ***      {Are you ordering a CV Procedure (e.g. stress test, cath, DCCV, TEE, etc)?   Press F2        :[id number]}    Medication Adjustments/Labs and Tests Ordered: Current medicines are reviewed at length with the patient today.  Concerns regarding medicines are outlined above.  No orders of the defined types were placed in this encounter.  No orders of the defined types were placed in this encounter.   There are no Patient Instructions on file for this visit.   Signed, Finis Bud, NP  10/10/2021 [place]

## 2021-10-11 ENCOUNTER — Ambulatory Visit: Payer: PPO | Admitting: Physician Assistant

## 2021-10-11 ENCOUNTER — Telehealth: Payer: Self-pay | Admitting: *Deleted

## 2021-10-11 ENCOUNTER — Other Ambulatory Visit (HOSPITAL_COMMUNITY): Payer: Self-pay

## 2021-10-11 DIAGNOSIS — I25118 Atherosclerotic heart disease of native coronary artery with other forms of angina pectoris: Secondary | ICD-10-CM

## 2021-10-11 DIAGNOSIS — I10 Essential (primary) hypertension: Secondary | ICD-10-CM

## 2021-10-11 DIAGNOSIS — E785 Hyperlipidemia, unspecified: Secondary | ICD-10-CM

## 2021-10-11 DIAGNOSIS — I739 Peripheral vascular disease, unspecified: Secondary | ICD-10-CM

## 2021-10-11 NOTE — Telephone Encounter (Signed)
Richard Parrish called to state he will be out until he sees MD and gets his medications straightened out.

## 2021-10-25 NOTE — Progress Notes (Unsigned)
Office Visit    Patient Name: Richard Parrish Date of Encounter: 10/25/2021  Primary Care Provider:  Seward Carol, MD Primary Cardiologist:  Sinclair Grooms, MD Primary Electrophysiologist: None  Chief Complaint    Richard Parrish is a 71 y.o. male with PMH of CAD s/p CABG, GERD, HTN, HLD, OSA (on CPAP), PAD, AAA, ILD who presents today for follow-up of coronary artery disease.  Past Medical History    Past Medical History:  Diagnosis Date   Anxiety    Arthritis    "lower back" (04/07/2017)   BPH (benign prostatic hypertrophy)    C. difficile diarrhea    Chronic lower back pain    Coronary artery disease 1989   cabg with dvg to rca    Depression    GERD (gastroesophageal reflux disease)    Hyperlipidemia    Hypertension    Insomnia    Myocardial infarction ([place]) 2002   OSA on CPAP    cpap setting of 60 per pt   Osteoarthritis    "hips" (04/07/2017)   PAD (peripheral artery disease) ([place])    Peripheral vascular disease ([place])    Pneumonia 1989   S/P CABG   Pulmonary embolism ([place]) 05/18/2021   Pulmonary fibrosis ([place])    "one time" (04/07/2017)   Spinal stenosis    Past Surgical History:  Procedure Laterality Date   ABDOMINAL AORTAGRAM N/A 03/15/2011   Procedure: ABDOMINAL Maxcine Ham;  Surgeon: Serafina Mitchell, MD;  Location: [facility] CATH LAB;  Service: Cardiovascular;  Laterality: N/A;   ABDOMINAL AORTAGRAM N/A 04/12/2011   Procedure: ABDOMINAL Maxcine Ham;  Surgeon: Serafina Mitchell, MD;  Location: [facility] CATH LAB;  Service: Cardiovascular;  Laterality: N/A;   BACK SURGERY     BRONCHIAL BIOPSY  03/03/2021   Procedure: BRONCHIAL BIOPSIES;  Surgeon: Garner Nash, DO;  Location: [place] ENDOSCOPY;  Service: Pulmonary;;   BRONCHIAL BRUSHINGS  03/03/2021   Procedure: BRONCHIAL BRUSHINGS;  Surgeon: Garner Nash, DO;  Location: [place];  Service: Pulmonary;;   BRONCHIAL NEEDLE ASPIRATION BIOPSY  03/03/2021   Procedure: BRONCHIAL NEEDLE ASPIRATION BIOPSIES;  Surgeon: Garner Nash, DO;  Location: [place] ENDOSCOPY;  Service: Pulmonary;;   CARDIAC CATHETERIZATION  08/1987   CORONARY ANGIOPLASTY WITH STENT PLACEMENT  ~ 2002   [facility], [place]   CORONARY ARTERY BYPASS GRAFT  09/1987   "CABG X1";  [facility]; [place]; "RCA"   FIDUCIAL MARKER PLACEMENT  03/03/2021   Procedure: FIDUCIAL DYE MARKING;  Surgeon: Garner Nash, DO;  Location: [place] [facility];  Service: Pulmonary;;   INTERCOSTAL NERVE BLOCK  03/03/2021   Procedure: INTERCOSTAL NERVE BLOCK;  Surgeon: Lajuana Matte, MD;  Location: [place];  Service: Thoracic;;   JOINT REPLACEMENT     KNEE ARTHROSCOPY Left    LAPAROSCOPIC CHOLECYSTECTOMY     LYMPH NODE BIOPSY  03/03/2021   Procedure: LYMPH NODE BIOPSY;  Surgeon: Lajuana Matte, MD;  Location: [place];  Service: Thoracic;;   LYMPH NODE DISSECTION  03/03/2021   Procedure: LYMPH NODE DISSECTION;  Surgeon: Lajuana Matte, MD;  Location: [facility];  Service: Thoracic;;   PERIPHERAL VASCULAR INTERVENTION Left 2016   "put a stent; right branch of left femoral"   POSTERIOR LUMBAR FUSION  2009   lumbar fusion, S L 5 to L 4   RIGHT/LEFT HEART CATH AND CORONARY/GRAFT ANGIOGRAPHY N/A 10/05/2020   Procedure: RIGHT/LEFT HEART CATH AND CORONARY/GRAFT ANGIOGRAPHY;  Surgeon: Nelva Bush, MD;  Location: [facility]  INVASIVE CV LAB;  Service: Cardiovascular;  Laterality: N/A;   SHOULDER ARTHROSCOPY W/ ROTATOR CUFF REPAIR Right 2006   SHOULDER ARTHROSCOPY WITH SUBACROMIAL DECOMPRESSION AND OPEN ROTATOR C Left ~ [place]     TOTAL HIP ARTHROPLASTY Right 2012   TOTAL HIP ARTHROPLASTY Left 04/07/2017   TOTAL HIP ARTHROPLASTY Left 04/07/2017   Procedure: LEFT TOTAL HIP ARTHROPLASTY;  Surgeon: Earlie Server, MD;  Location: [place];  Service: Orthopedics;  Laterality: Left;   VIDEO BRONCHOSCOPY WITH RADIAL ENDOBRONCHIAL ULTRASOUND  03/03/2021   Procedure: VIDEO BRONCHOSCOPY WITH RADIAL ENDOBRONCHIAL ULTRASOUND;  Surgeon: Garner Nash,  DO;  Location: [place] ENDOSCOPY;  Service: Pulmonary;;    Allergies  Allergies  Allergen Reactions   Ambien [Zolpidem Tartrate] Other (See Comments)    Bad dreams/Vivid Dreams    History of Present Illness    Richard Parrish is a 71 year old male with the above-mentioned medical history who presents today for follow-up of coronary artery disease.  He has history of coronary artery disease dating back to 2012 when cardiac cath was performed with failed PCI requiring CABG.  Most recent 2D echo showed LVEF and no wall motion abnormalities. He was last seen by Laurann [place], NP in 2022 for hospital follow-up.  Patient and underwent left heart cath for complaint of exertional chest pain and shortness of breath.  Cath revealed single-vessel disease with chronic total occlusion of RCA with patent grafts and normal left and right filling pressures.  Medical therapy was recommended for ongoing treatment.  He was admitted on 05/18/2021 with complaint of shortness of breath and was found to have pulmonary embolism with DVT.  CT scan showed submassive PE with right heart strain.  Lower extremity Dopplers were positive for left peroneal vein DVT.  He was treated with anticoagulation and provided O2 on discharge.  Patient had experienced orthostasis and Imdur was discontinued upon discharge.  He was last seen in the ED on 8/9 for complaint of chest discomfort and tightness.  Patient's initial troponins were negative and EKG showed no ischemic findings.  He was discharged with strict precautions initiated.  Richard Parrish presents today for a follow-up with his wife.  Since last being seen in the office patient reports he has been doing well with no recurrence of angina since discontinuing Imdur.  He has been quite active and recently went to the beach and swim and finished off the pier.  He has had some shortness of breath that is chronic and at baseline for him but denies any palpitations or dizziness.  He is hoping to  resume pulmonary rehab following today's visit.  Patient denies chest pain, palpitations, dyspnea, PND, orthopnea, nausea, vomiting, dizziness, syncope, edema, weight gain, or early satiety.   Home Medications    Current Outpatient Medications  Medication Sig Dispense Refill   acetaminophen (TYLENOL) 500 MG tablet Take 1 tablet (500 mg total) by mouth every 6 (six) hours as needed for mild pain.     ALPRAZolam (XANAX) 0.5 MG tablet Take 0.5-1 tablets (0.25-0.5 mg total) by mouth 3 (three) times daily as needed for anxiety or sleep. 5 tablet 0   antiseptic oral rinse (BIOTENE) LIQD 15 mLs by Mouth Rinse route daily as needed for dry mouth.     atorvastatin (LIPITOR) 80 MG tablet TAKE 1 TABLET BY MOUTH EVERY DAY 90 tablet 0   cholecalciferol (VITAMIN D3) 25 MCG (1000 UNIT) tablet Take 1,000 Units by mouth in the morning.     clopidogrel (  PLAVIX) 75 MG tablet TAKE 1 TABLET BY MOUTH EVERY DAY (Patient taking differently: Take 75 mg by mouth daily.) 30 tablet 11   diphenhydrAMINE (BENADRYL) 25 mg capsule Take 25 mg by mouth at bedtime as needed for sleep (for sleep).     ELIQUIS 5 MG TABS tablet TAKE 1 TABLET BY MOUTH TWICE A DAY 180 tablet 1   gabapentin (NEURONTIN) 100 MG capsule Take 2 capsules (200 mg total) by mouth 3 (three) times daily. 90 capsule 1   guaiFENesin (MUCINEX) 600 MG 12 hr tablet Take 1 tablet (600 mg total) by mouth 2 (two) times daily as needed. (Patient taking differently: Take 600 mg by mouth 2 (two) times daily as needed for cough or to loosen phlegm.)     HYDROcodone-acetaminophen (NORCO) 7.5-325 MG tablet Take 1 tablet by mouth every 4 (four) hours as needed.     hyoscyamine (LEVSIN, ANASPAZ) 0.125 MG tablet Take 0.125 mg by mouth every 4 (four) hours as needed for cramping.      isosorbide mononitrate (IMDUR) 30 MG 24 hr tablet Take 30 mg by mouth daily.     metoprolol succinate (TOPROL-XL) 50 MG 24 hr tablet Take 0.5 tablets (25 mg total) by mouth daily. Take with or  immediately following a meal.     nitroGLYCERIN (NITROSTAT) 0.4 MG SL tablet Place 0.4 mg under the tongue every 5 (five) minutes as needed for chest pain.     pantoprazole (PROTONIX) 40 MG tablet TAKE 1 TABLET BY MOUTH EVERY DAY BEFORE BREAKFAST 90 tablet 1   PARoxetine (PAXIL) 40 MG tablet Take 40 mg by mouth in the morning.  6   Pirfenidone 267 MG TABS Take 3 tablets (801 mg total) by mouth 3 (three) times daily with meals. 270 tablet 1   predniSONE (DELTASONE) 10 MG tablet Take 40mg  daily for 1 month then reduce 10mg  monthly 120 tablet 2   Probiotic Product (PROBIOTIC PO) Take 1 capsule by mouth daily.     sodium chloride (OCEAN) 0.65 % SOLN nasal spray Place 1 spray into both nostrils daily as needed for congestion.     sulfamethoxazole-trimethoprim (BACTRIM DS) 800-160 MG tablet Take 1 tablet by mouth 3 (three) times a week. 12 tablet 5   tamsulosin (FLOMAX) 0.4 MG CAPS capsule Take 0.4 mg by mouth 2 (two) times daily.     No current facility-administered medications for this visit.     Review of Systems  Please see the history of present illness.    (+) Chronic shortness of breath  All other systems reviewed and are otherwise negative except as noted above.  Physical Exam    Wt Readings from Last 3 Encounters:  10/06/21 220 lb (99.8 kg)  09/24/21 219 lb 12.8 oz (99.7 kg)  09/20/21 214 lb (97.1 kg)   DD:UKGUR were no vitals filed for this visit.,There is no height or weight on file to calculate BMI.  Constitutional:      Appearance: Healthy appearance. Not in distress.  Neck:     Vascular: JVD normal.  Pulmonary:     Effort: Pulmonary effort is normal.     Breath sounds: No wheezing. No rales. Diminished in the bases Cardiovascular:     Normal rate. Regular rhythm. Normal S1. Normal S2.      Murmurs: There is no murmur.  Edema:    Peripheral edema absent.  Abdominal:     Palpations: Abdomen is soft non tender. There is no hepatomegaly.  Skin:    General: Skin  is warm  and dry.  Neurological:     General: No focal deficit present.     Mental Status: Alert and oriented to person, place and time.     Cranial Nerves: Cranial nerves are intact.  EKG/LABS/Other Studies Reviewed    ECG personally reviewed by me today -none completed today   Lab Results  Component Value Date   CREATININE 0.88 10/06/2021   BUN 14 10/06/2021   NA 139 10/06/2021   K 4.1 10/06/2021   CL 106 10/06/2021   CO2 25 10/06/2021   Lab Results  Component Value Date   ALT 21 09/17/2021   AST 22 09/17/2021   ALKPHOS 49 09/17/2021   BILITOT 0.8 09/17/2021   Lab Results  Component Value Date   CHOL 97 10/04/2020   HDL 35 (L) 10/04/2020   LDLCALC 53 10/04/2020   TRIG 46 10/04/2020   CHOLHDL 2.8 10/04/2020    Lab Results  Component Value Date   HGBA1C 5.3 10/03/2020    Assessment & Plan    1.  Coronary artery disease: -Patient recently seen in the ED with complaint of chest pain off of Imdur.  He was started back on Imdur and has currently been off for 3 weeks. -Today patient states he has been doing well with no recurrence of angina -Continue GDMT with Plavix 75 mg, Imdur 30 mg, Toprol XL 25 mg, and Lipitor 80 mg daily -He was advised to take extra 25 of Toprol-XL if chest pain reoccurs and contact our office if not relieved. -Continue nitroglycerin PRN  2.  HLD: -Continue Lipitor 80 mg as noted above -Patient is currently following a low-sodium heart healthy diet  3.  HTN: -Patient has history of orthostasis and blood pressure today was well controlled at 108/66 -Continue Toprol XL 25 mg  4.  History of pulmonary embolism: -History of DVT and pulmonary embolism patient currently being treated with Eliquis 5 mg  5.  Interstitial lung disease: -Patient currently in pulmonary rehab on 5 L nasal cannula chronically -Patient advised to resume pulmonary rehab at this time  Disposition: Follow-up with Belva Crome III, MD or APP in 6 months   Medication  Adjustments/Labs and Tests Ordered: Current medicines are reviewed at length with the patient today.  Concerns regarding medicines are outlined above.   Signed, Mable Fill, Marissa Nestle, NP 10/25/2021, 5:43 PM [place]

## 2021-10-26 ENCOUNTER — Encounter: Payer: Self-pay | Admitting: Nurse Practitioner

## 2021-10-26 ENCOUNTER — Ambulatory Visit: Payer: PPO | Attending: Nurse Practitioner | Admitting: Nurse Practitioner

## 2021-10-26 VITALS — BP 108/66 | HR 76 | Ht 72.0 in | Wt 215.4 lb

## 2021-10-26 DIAGNOSIS — J849 Interstitial pulmonary disease, unspecified: Secondary | ICD-10-CM

## 2021-10-26 DIAGNOSIS — E785 Hyperlipidemia, unspecified: Secondary | ICD-10-CM | POA: Diagnosis not present

## 2021-10-26 DIAGNOSIS — I10 Essential (primary) hypertension: Secondary | ICD-10-CM | POA: Diagnosis not present

## 2021-10-26 DIAGNOSIS — I739 Peripheral vascular disease, unspecified: Secondary | ICD-10-CM

## 2021-10-26 DIAGNOSIS — I200 Unstable angina: Secondary | ICD-10-CM | POA: Diagnosis not present

## 2021-10-26 NOTE — Patient Instructions (Addendum)
Medication Instructions:  Your physician has recommended you make the following change in your medication:   STOP Imdur  You can take a extras Toprol if you have increased chest pain    *If you need a refill on your cardiac medications before your next appointment, please call your pharmacy*   Lab Work: None ordered  If you have labs (blood work) drawn today and your tests are completely normal, you will receive your results only by: [facility] [place] (if you have MyChart) OR A paper copy in the mail If you have any lab test that is abnormal or we need to change your treatment, we will call you to review the results.   Testing/Procedures: None ordered   Follow-Up: At [facility], you and your health needs are our priority.  As part of our continuing mission to provide you with exceptional heart care, we have created designated Provider Care Teams.  These Care Teams include your primary Cardiologist (physician) and Advanced Practice Providers (APPs -  Physician Assistants and Nurse Practitioners) who all work together to provide you with the care you need, when you need it.  We recommend signing up for the patient portal called "MyChart".  Sign up information is provided on this After Visit Summary.  MyChart is used to connect with patients for Virtual Visits (Telemedicine).  Patients are able to view lab/test results, encounter notes, upcoming appointments, etc.  Non-urgent messages can be sent to your provider as well.   To learn more about what you can do with MyChart, go to [email or web address].    Your next appointment:   6 month(s)  The format for your next appointment:   In Person  Provider:   Sinclair Grooms, MD     Other Instructions   Important Information About Sugar

## 2021-10-27 ENCOUNTER — Other Ambulatory Visit (HOSPITAL_COMMUNITY): Payer: Self-pay

## 2021-10-27 ENCOUNTER — Other Ambulatory Visit: Payer: Self-pay | Admitting: Pulmonary Disease

## 2021-10-27 ENCOUNTER — Encounter: Payer: PPO | Admitting: *Deleted

## 2021-10-27 DIAGNOSIS — J849 Interstitial pulmonary disease, unspecified: Secondary | ICD-10-CM

## 2021-10-27 NOTE — Progress Notes (Signed)
Daily Session Note  Patient Details  Name: Richard Parrish MRN: [id number] Date of Birth: 09-14-1950 Referring Provider:   Flowsheet Row Pulmonary Rehab from 07/14/2021 in [facility] Cardiac and Pulmonary Rehab  Referring Provider Marshell Garfinkel MD       Encounter Date: 10/27/2021  Check In:  Session Check In - 10/27/21 1542       Check-In   Supervising physician immediately available to respond to emergencies See telemetry face sheet for immediately available ER MD    Location [facility]-Cardiac & Pulmonary Rehab    Staff Present Renita Papa, RN BSN;Joseph Tessie Fass, RCP,RRT,BSRT;Melissa [place], [place], LDN    Virtual Visit No    Medication changes reported     Yes    Comments started [facility] or balance concerns reported    No    Warm-up and Cool-down Performed on first and last piece of equipment    Resistance Training Performed Yes    VAD Patient? No    PAD/SET Patient? No      Pain Assessment   Currently in Pain? No/denies                Social History   Tobacco Use  Smoking Status Former   Packs/day: 2.00   Years: 15.00   Total pack years: 30.00   Types: Cigarettes   Quit date: 03/01/1987   Years since quitting: 34.6  Smokeless Tobacco Never  Tobacco Comments   03/02/21-Pt instructed to not vape or drink alcohol for 24 hours prior to surgery.     Goals Met:  Independence with exercise equipment Exercise tolerated well No report of concerns or symptoms today Strength training completed today  Goals Unmet:  Not Applicable  Comments: Pt able to follow exercise prescription today without complaint.  Will continue to monitor for progression.    Dr. Emily Filbert is Medical Director for [place].  Dr. Ottie Glazier is Medical Director for [facility] Pulmonary Rehabilitation.

## 2021-10-27 NOTE — Telephone Encounter (Addendum)
Refill pended for PIRFENIDONE to [place]: [phone]   Dose: 801 mg three times daily  Last OV: 09/24/21 Provider: Dr. Vaughan Browner  Next OV: 12/20/21  LFTs on 09/17/21 wnl  Knox Saliva, PharmD, MPH, BCPS Clinical Pharmacist (Rheumatology and Pulmonology)

## 2021-10-28 ENCOUNTER — Encounter: Payer: PPO | Admitting: *Deleted

## 2021-10-28 DIAGNOSIS — J849 Interstitial pulmonary disease, unspecified: Secondary | ICD-10-CM

## 2021-10-28 NOTE — Progress Notes (Signed)
Daily Session Note  Patient Details  Name: Richard Parrish MRN: [id number] Date of Birth: [birth year] Referring Provider:   Flowsheet Row Pulmonary Rehab from 07/14/2021 in [facility] Cardiac and Pulmonary Rehab  Referring Provider Marshell Garfinkel MD       Encounter Date: 10/28/2021  Check In:  Session Check In - 10/28/21 1545       Check-In   Supervising physician immediately available to respond to emergencies See telemetry face sheet for immediately available ER MD    Location [facility]-Cardiac & Pulmonary Rehab    Staff Present Renita Papa, RN BSN;Joseph [place], RCP,RRT,BSRT;Jessica Collyer, [place], RCEP, CCRP, CCET    Virtual Visit No    Medication changes reported     No    Fall or balance concerns reported    No    Warm-up and Cool-down Performed on first and last piece of equipment    Resistance Training Performed Yes    VAD Patient? No    PAD/SET Patient? No      Pain Assessment   Currently in Pain? No/denies                Social History   Tobacco Use  Smoking Status Former   Packs/day: 2.00   Years: 15.00   Total pack years: 30.00   Types: Cigarettes   Quit date: 03/01/1987   Years since quitting: 34.6  Smokeless Tobacco Never  Tobacco Comments   03/02/21-Pt instructed to not vape or drink alcohol for 24 hours prior to surgery.     Goals Met:  Independence with exercise equipment Exercise tolerated well No report of concerns or symptoms today Strength training completed today  Goals Unmet:  Not Applicable  Comments: Pt able to follow exercise prescription today without complaint.  Will continue to monitor for progression.    Dr. Emily Filbert is Medical Director for [place].  Dr. Ottie Glazier is Medical Director for [facility] Pulmonary Rehabilitation.

## 2021-11-02 ENCOUNTER — Other Ambulatory Visit: Payer: Self-pay | Admitting: Pulmonary Disease

## 2021-11-02 ENCOUNTER — Telehealth: Payer: Self-pay | Admitting: Pharmacist

## 2021-11-02 ENCOUNTER — Other Ambulatory Visit (HOSPITAL_COMMUNITY): Payer: Self-pay

## 2021-11-02 DIAGNOSIS — J849 Interstitial pulmonary disease, unspecified: Secondary | ICD-10-CM

## 2021-11-02 DIAGNOSIS — J84112 Idiopathic pulmonary fibrosis: Secondary | ICD-10-CM

## 2021-11-02 MED ORDER — PIRFENIDONE 267 MG PO TABS
801.0000 mg | ORAL_TABLET | Freq: Three times a day (TID) | ORAL | 5 refills | Status: DC
  Filled 2021-11-02: qty 270, 30d supply, fill #0

## 2021-11-02 MED ORDER — PIRFENIDONE 801 MG PO TABS
1.0000 | ORAL_TABLET | Freq: Three times a day (TID) | ORAL | 2 refills | Status: DC
  Filled 2021-11-02: qty 90, 30d supply, fill #0

## 2021-11-02 NOTE — Telephone Encounter (Addendum)
Received notification from [facility] that the pirfenidone 801mg  tablets requires PA  Per wife, Zada Girt, patient has three day supply of pirfenidone. To prevent interruption in therapy, will re-process next month's supply for the 267mg  tablets while we work on the authorization for the 801mg  tablets. Patient's wife is in agreement and expressed understanding  Knox Saliva, PharmD, MPH, BCPS, CPP Clinical Pharmacist (Rheumatology and Pulmonology)

## 2021-11-02 NOTE — Telephone Encounter (Signed)
Refill sent for PIRFENIDONE to [facility]: [phone]   Spoke with patient's wife, Zada Girt, who states that he is tolerated the 3 tabs of 267mg  pirfenidone tab three times daily without any issues. She states she manages his medications and would like him to be changed to higher dose tablet. Reviewed that he'd be taking 1 tablet three times daily instead of 9 tablets daily.  Dose: 801 mg three times daily  Last OV: 09/24/21 Provider: Dr. Vaughan Browner  Next OV: 12/20/21  LFTs on 09/17/21 wnl  Jennye Boroughs Wilhemina Bonito, PharmD, MPH, BCPS Clinical Pharmacist (Rheumatology and Pulmonology)

## 2021-11-03 ENCOUNTER — Encounter: Payer: Self-pay | Admitting: *Deleted

## 2021-11-03 ENCOUNTER — Encounter: Payer: PPO | Attending: Pulmonary Disease | Admitting: *Deleted

## 2021-11-03 ENCOUNTER — Other Ambulatory Visit (HOSPITAL_COMMUNITY): Payer: Self-pay

## 2021-11-03 DIAGNOSIS — J849 Interstitial pulmonary disease, unspecified: Secondary | ICD-10-CM | POA: Diagnosis present

## 2021-11-03 NOTE — Progress Notes (Signed)
Pulmonary Individual Treatment Plan  Patient Details  Name: Richard Parrish MRN: [id number] Date of Birth: June 15, 1950 Referring Provider:   Flowsheet Row Pulmonary Rehab from 07/14/2021 in [facility] Cardiac and Pulmonary Rehab  Referring Provider Marshell Garfinkel MD       Initial Encounter Date:  Flowsheet Row Pulmonary Rehab from 07/14/2021 in [facility] Cardiac and Pulmonary Rehab  Date 07/14/21       Visit Diagnosis: ILD (interstitial lung disease) ([place])  Patient's Home Medications on Admission:  Current Outpatient Medications:    acetaminophen (TYLENOL) 500 MG tablet, Take 1 tablet (500 mg total) by mouth every 6 (six) hours as needed for mild pain., Disp: , Rfl:    ALPRAZolam (XANAX) 0.5 MG tablet, Take 0.5-1 tablets (0.25-0.5 mg total) by mouth 3 (three) times daily as needed for anxiety or sleep., Disp: 5 tablet, Rfl: 0   antiseptic oral rinse (BIOTENE) LIQD, 15 mLs by Mouth Rinse route daily as needed for dry mouth., Disp: , Rfl:    atorvastatin (LIPITOR) 80 MG tablet, TAKE 1 TABLET BY MOUTH EVERY DAY, Disp: 90 tablet, Rfl: 0   cholecalciferol (VITAMIN D3) 25 MCG (1000 UNIT) tablet, Take 1,000 Units by mouth in the morning., Disp: , Rfl:    clopidogrel (PLAVIX) 75 MG tablet, TAKE 1 TABLET BY MOUTH EVERY DAY (Patient not taking: Reported on 10/26/2021), Disp: 30 tablet, Rfl: 11   diphenhydrAMINE (BENADRYL) 25 mg capsule, Take 25 mg by mouth at bedtime as needed for sleep (for sleep)., Disp: , Rfl:    ELIQUIS 5 MG TABS tablet, TAKE 1 TABLET BY MOUTH TWICE A DAY, Disp: 180 tablet, Rfl: 1   gabapentin (NEURONTIN) 100 MG capsule, Take 2 capsules (200 mg total) by mouth 3 (three) times daily., Disp: 90 capsule, Rfl: 1   guaiFENesin (MUCINEX) 600 MG 12 hr tablet, Take 1 tablet (600 mg total) by mouth 2 (two) times daily as needed. (Patient taking differently: Take 600 mg by mouth 2 (two) times daily as needed for cough or to loosen phlegm.), Disp: , Rfl:    HYDROcodone-acetaminophen (NORCO)  7.5-325 MG tablet, Take 1 tablet by mouth every 4 (four) hours as needed., Disp: , Rfl:    hyoscyamine (LEVSIN, ANASPAZ) 0.125 MG tablet, Take 0.125 mg by mouth every 4 (four) hours as needed for cramping. , Disp: , Rfl:    metoprolol succinate (TOPROL-XL) 50 MG 24 hr tablet, Take 0.5 tablets (25 mg total) by mouth daily. Take with or immediately following a meal., Disp: , Rfl:    nitroGLYCERIN (NITROSTAT) 0.4 MG SL tablet, Place 0.4 mg under the tongue every 5 (five) minutes as needed for chest pain., Disp: , Rfl:    pantoprazole (PROTONIX) 40 MG tablet, TAKE 1 TABLET BY MOUTH EVERY DAY BEFORE BREAKFAST, Disp: 90 tablet, Rfl: 1   PARoxetine (PAXIL) 40 MG tablet, Take 40 mg by mouth in the morning., Disp: , Rfl: 6   Pirfenidone 267 MG TABS, Take 3 tablets (801 mg total) by mouth 3 (three) times daily with meals., Disp: 270 tablet, Rfl: 5   predniSONE (DELTASONE) 10 MG tablet, Take 85m daily for 1 month then reduce 160mmonthly, Disp: 120 tablet, Rfl: 2   Probiotic Product (PROBIOTIC PO), Take 1 capsule by mouth daily., Disp: , Rfl:    sodium chloride (OCEAN) 0.65 % SOLN nasal spray, Place 1 spray into both nostrils daily as needed for congestion., Disp: , Rfl:    sulfamethoxazole-trimethoprim (BACTRIM DS) 800-160 MG tablet, Take 1 tablet by mouth 3 (three)  times a week., Disp: 12 tablet, Rfl: 5   tamsulosin (FLOMAX) 0.4 MG CAPS capsule, Take 0.4 mg by mouth 2 (two) times daily., Disp: , Rfl:   Past Medical History: Past Medical History:  Diagnosis Date   Anxiety    Arthritis    "lower back" (04/07/2017)   BPH (benign prostatic hypertrophy)    C. difficile diarrhea    Chronic lower back pain    Coronary artery disease 1989   cabg with dvg to rca    Depression    GERD (gastroesophageal reflux disease)    Hyperlipidemia    Hypertension    Insomnia    Myocardial infarction ([place]) 2002   OSA on CPAP    cpap setting of 60 per pt   Osteoarthritis    "hips" (04/07/2017)   PAD (peripheral artery  disease) ([place])    Peripheral vascular disease ([place])    Pneumonia 1989   S/P CABG   Pulmonary embolism ([place]) 05/18/2021   Pulmonary fibrosis ([place])    "one time" (04/07/2017)   Spinal stenosis     Tobacco Use: Social History   Tobacco Use  Smoking Status Former   Packs/day: 2.00   Years: 15.00   Total pack years: 30.00   Types: Cigarettes   Quit date: 03/01/1987   Years since quitting: 34.7  Smokeless Tobacco Never  Tobacco Comments   03/02/21-Pt instructed to not vape or drink alcohol for 24 hours prior to surgery.     Labs: Review Flowsheet  More data exists      Latest Ref Rng & Units 10/04/2020 10/05/2020 03/02/2021 04/13/2021 05/18/2021  Labs for ITP Cardiac and Pulmonary Rehab  Cholestrol 0 - 200 mg/dL 97  - - - -  LDL (calc) 0 - 99 mg/dL 53  - - - -  HDL-C >40 mg/dL 35  - - - -  Trlycerides <150 mg/dL 46  - - - -  PH, Arterial 7.350 - 7.450 - 7.355  7.402  - -  PCO2 arterial 32.0 - 48.0 mmHg - 41.9  38.9  - -  Bicarbonate 20.0 - 28.0 mmol/L - 23.4  23.7  23.7  - -  TCO2 22 - 32 mmol/L - 25  25  - 29  25   Acid-base deficit 0.0 - 2.0 mmol/L - 2.0  2.0  0.5  - -  O2 Saturation % - 99.0  70.0  97.6  - -     Pulmonary Assessment Scores:  Pulmonary Assessment Scores     Row Name 07/14/21 1228         ADL UCSD   ADL Phase Entry     SOB Score total 55     Rest 0     Walk 3     Stairs 4     Bath 3     Dress 2     Shop 2       CAT Score   CAT Score 21       mMRC Score   mMRC Score 3              UCSD: Self-administered rating of dyspnea associated with activities of daily living (ADLs) 6-point scale (0 = "not at all" to 5 = "maximal or unable to do because of breathlessness")  Scoring Scores range from 0 to 120.  Minimally important difference is 5 units  CAT: CAT can identify the health impairment of COPD patients and is better correlated with disease progression.  CAT has a  scoring range of zero to 40. The CAT score is classified into four groups of low  (less than 10), medium (10 - 20), high (21-30) and very high (31-40) based on the impact level of disease on health status. A CAT score over 10 suggests significant symptoms.  A worsening CAT score could be explained by an exacerbation, poor medication adherence, poor inhaler technique, or progression of COPD or comorbid conditions.  CAT MCID is 2 points  mMRC: mMRC (Modified Medical Research Council) Dyspnea Scale is used to assess the degree of baseline functional disability in patients of respiratory disease due to dyspnea. No minimal important difference is established. A decrease in score of 1 point or greater is considered a positive change.   Pulmonary Function Assessment:   Exercise Target Goals: Exercise Program Goal: Individual exercise prescription set using results from initial 6 min walk test and THRR while considering  patient's activity barriers and safety.   Exercise Prescription Goal: Initial exercise prescription builds to 30-45 minutes a day of aerobic activity, 2-3 days per week.  Home exercise guidelines will be given to patient during program as part of exercise prescription that the participant will acknowledge.  Education: Aerobic Exercise: - Group verbal and visual presentation on the components of exercise prescription. Introduces F.I.T.T principle from ACSM for exercise prescriptions.  Reviews F.I.T.T. principles of aerobic exercise including progression. Written material given at graduation.   Education: Resistance Exercise: - Group verbal and visual presentation on the components of exercise prescription. Introduces F.I.T.T principle from ACSM for exercise prescriptions  Reviews F.I.T.T. principles of resistance exercise including progression. Written material given at graduation.    Education: Exercise & Equipment Safety: - Individual verbal instruction and demonstration of equipment use and safety with use of the equipment. Flowsheet Row Pulmonary Rehab from  10/06/2021 in [facility] Cardiac and Pulmonary Rehab  Education need identified 07/14/21  Date 07/14/21  Educator [place]  Instruction Review Code 1- Verbalizes Understanding       Education: Exercise Physiology & General Exercise Guidelines: - Group verbal and written instruction with models to review the exercise physiology of the cardiovascular system and associated critical values. Provides general exercise guidelines with specific guidelines to those with heart or lung disease.  Flowsheet Row Pulmonary Rehab from 10/06/2021 in [facility] Cardiac and Pulmonary Rehab  Education need identified 07/14/21  Date 08/25/21  Educator [place]  Instruction Review Code 1- [employer] Understanding       Education: Flexibility, Balance, Mind/Body Relaxation: - Group verbal and visual presentation with interactive activity on the components of exercise prescription. Introduces F.I.T.T principle from ACSM for exercise prescriptions. Reviews F.I.T.T. principles of flexibility and balance exercise training including progression. Also discusses the mind body connection.  Reviews various relaxation techniques to help reduce and manage stress (i.e. Deep breathing, progressive muscle relaxation, and visualization). Balance handout provided to take home. Written material given at graduation.   Activity Barriers & Risk Stratification:  Activity Barriers & Cardiac Risk Stratification - 07/14/21 1301       Activity Barriers & Cardiac Risk Stratification   Activity Barriers Shortness of Breath;Deconditioning;Back Problems;History of Falls;Joint Problems;Muscular Weakness             6 Minute Walk:  6 Minute Walk     Row Name 07/14/21 1301         6 Minute Walk   Phase Initial     Distance 815 feet     Walk Time 6 minutes     # of Rest Breaks  1  Stopped at 5:51     MPH 1.54     METS 1.92     RPE 11     Perceived Dyspnea  2     VO2 Peak 6.74     Symptoms Yes (comment)     Comments SOB, back pain 5/10, hip pain  4/10     Resting HR 79 bpm     Resting BP 110/68     Resting Oxygen Saturation  93 %     Exercise Oxygen Saturation  during 6 min walk 85 %     Max Ex. HR 97 bpm     Max Ex. BP 126/72     2 Minute Post BP 112/68       Interval HR   1 Minute HR 79     2 Minute HR 92     3 Minute HR 97     4 Minute HR 97     5 Minute HR 97     6 Minute HR 96     2 Minute Post HR 75     Interval Heart Rate? Yes       Interval Oxygen   Interval Oxygen? Yes     Baseline Oxygen Saturation % 93 %     1 Minute Oxygen Saturation % 92 %     1 Minute Liters of Oxygen 5 L     2 Minute Oxygen Saturation % 90 %     2 Minute Liters of Oxygen 5 L     3 Minute Oxygen Saturation % 86 %     3 Minute Liters of Oxygen 5 L     4 Minute Oxygen Saturation % 85 %     4 Minute Liters of Oxygen 5 L     5 Minute Oxygen Saturation % 86 %     5 Minute Liters of Oxygen 5 L     6 Minute Oxygen Saturation % 87 %     6 Minute Liters of Oxygen 5 L     2 Minute Post Oxygen Saturation % 96 %     2 Minute Post Liters of Oxygen 5 L             Oxygen Initial Assessment:  Oxygen Initial Assessment - 07/14/21 1228       Home Oxygen   Home Oxygen Device Home Concentrator;E-Tanks    Sleep Oxygen Prescription Continuous    Liters per minute 4    Home Exercise Oxygen Prescription Continuous    Liters per minute 4    Home Resting Oxygen Prescription Continuous    Liters per minute 4    Compliance with Home Oxygen Use Yes      Initial 6 min Walk   Oxygen Used Pulsed    Liters per minute 5      Program Oxygen Prescription   Program Oxygen Prescription Continuous    Liters per minute 4      Intervention   Short Term Goals To learn and exhibit compliance with exercise, home and travel O2 prescription;To learn and understand importance of maintaining oxygen saturations>88%;To learn and demonstrate proper use of respiratory medications;To learn and understand importance of monitoring SPO2 with pulse oximeter and  demonstrate accurate use of the pulse oximeter.;To learn and demonstrate proper pursed lip breathing techniques or other breathing techniques.     Long  Term Goals Exhibits compliance with exercise, home  and travel O2 prescription;Maintenance of O2 saturations>88%;Compliance with respiratory medication;Verbalizes importance  of monitoring SPO2 with pulse oximeter and return demonstration;Exhibits proper breathing techniques, such as pursed lip breathing or other method taught during program session;Demonstrates proper use of MDI's             Oxygen Re-Evaluation:  Oxygen Re-Evaluation     Row Name 07/19/21 1534 07/22/21 1543 08/23/21 1605 09/09/21 1547       Program Oxygen Prescription   Program Oxygen Prescription Continuous Continuous Continuous Continuous    Liters per minute 4 4 4 5       Home Oxygen   Home Oxygen Device Home Concentrator;E-Tanks Home Concentrator;E-Tanks Home Concentrator;E-Tanks Home Concentrator;E-Tanks    Sleep Oxygen Prescription Continuous Continuous Continuous Continuous    Liters per minute 4 4 4 4     Home Exercise Oxygen Prescription Continuous Continuous Continuous Continuous    Liters per minute 4 4 4 5     Home Resting Oxygen Prescription Continuous Continuous Continuous Continuous    Liters per minute 4 4 4 4     Compliance with Home Oxygen Use Yes Yes Yes  He usually wears his O2, but sometimes he will see if his O2 drops at certain times - such as walking down to rehab. Yes      Goals/Expected Outcomes   Short Term Goals To learn and exhibit compliance with exercise, home and travel O2 prescription;To learn and understand importance of maintaining oxygen saturations>88%;To learn and understand importance of monitoring SPO2 with pulse oximeter and demonstrate accurate use of the pulse oximeter.;To learn and demonstrate proper pursed lip breathing techniques or other breathing techniques.  To learn and understand importance of monitoring SPO2 with pulse  oximeter and demonstrate accurate use of the pulse oximeter.;To learn and understand importance of maintaining oxygen saturations>88% To learn and understand importance of monitoring SPO2 with pulse oximeter and demonstrate accurate use of the pulse oximeter.;To learn and understand importance of maintaining oxygen saturations>88% To learn and understand importance of monitoring SPO2 with pulse oximeter and demonstrate accurate use of the pulse oximeter.;To learn and understand importance of maintaining oxygen saturations>88%    Long  Term Goals Exhibits compliance with exercise, home  and travel O2 prescription;Maintenance of O2 saturations>88%;Verbalizes importance of monitoring SPO2 with pulse oximeter and return demonstration;Exhibits proper breathing techniques, such as pursed lip breathing or other method taught during program session Verbalizes importance of monitoring SPO2 with pulse oximeter and return demonstration;Maintenance of O2 saturations>88% Verbalizes importance of monitoring SPO2 with pulse oximeter and return demonstration;Maintenance of O2 saturations>88% Verbalizes importance of monitoring SPO2 with pulse oximeter and return demonstration;Maintenance of O2 saturations>88%    Comments Reviewed PLB technique with pt.  Talked about how it works and it's importance in maintaining their exercise saturations. He has a pulse oximeter to check his oxygen saturation at home. Informed and explained why it is important to have one. Reviewed that oxygen saturations should be 88 percent and above. Bill reports his shortness of breath has been improving since starting rehab. He checks his O2 all the time and keeps a pulse oximeter on him - he makes sure his O2 does not dip below 88. He practices PLB when he gets short of breath. He also practices diaphragmatic breathing laying down. Bill reports his shortness of breath has gotten worse about a month ago. He will take breaks as needed when performing ADLs;  he has two pulse oximeters which he uses - he knows his O2 should stay above 88%. He continues to practice diaphragmatic breathing. He continues to use PLB when he has  shortness of breath and feels this helps.    Goals/Expected Outcomes Short: Become more profiecient at using PLB.   Long: Become independent at using PLB. Short: monitor oxygen at home with exertion. Long: maintain oxygen saturations above 88 percent independently. Short: continue to check O2 Long: maintain oxygen saturations above 88 percent independently. Short: continue to check O2 Long: maintain oxygen saturations above 88 percent independently.             Oxygen Discharge (Final Oxygen Re-Evaluation):  Oxygen Re-Evaluation - 09/09/21 1547       Program Oxygen Prescription   Program Oxygen Prescription Continuous    Liters per minute 5      Home Oxygen   Home Oxygen Device Home Concentrator;E-Tanks    Sleep Oxygen Prescription Continuous    Liters per minute 4    Home Exercise Oxygen Prescription Continuous    Liters per minute 5    Home Resting Oxygen Prescription Continuous    Liters per minute 4    Compliance with Home Oxygen Use Yes      Goals/Expected Outcomes   Short Term Goals To learn and understand importance of monitoring SPO2 with pulse oximeter and demonstrate accurate use of the pulse oximeter.;To learn and understand importance of maintaining oxygen saturations>88%    Long  Term Goals Verbalizes importance of monitoring SPO2 with pulse oximeter and return demonstration;Maintenance of O2 saturations>88%    Comments Bill reports his shortness of breath has gotten worse about a month ago. He will take breaks as needed when performing ADLs; he has two pulse oximeters which he uses - he knows his O2 should stay above 88%. He continues to practice diaphragmatic breathing. He continues to use PLB when he has shortness of breath and feels this helps.    Goals/Expected Outcomes Short: continue to check O2 Long:  maintain oxygen saturations above 88 percent independently.             Initial Exercise Prescription:  Initial Exercise Prescription - 07/14/21 1300       Date of Initial Exercise RX and Referring Provider   Date 07/14/21    Referring Provider Marshell Garfinkel MD      Oxygen   Oxygen Continuous    Liters 4    Maintain Oxygen Saturation 88% or higher      NuStep   Level 1    SPM 80    Minutes 15    METs 1.9      T5 Nustep   Level 1    SPM 80    Minutes 15    METs 1.9      Biostep-RELP   Level 1    SPM 50    Minutes 15    METs 1.9      Track   Laps 15    Minutes 15    METs 1.82      Prescription Details   Frequency (times per week) 3    Duration Progress to 30 minutes of continuous aerobic without signs/symptoms of physical distress      Intensity   THRR 40-80% of Max Heartrate 106 - 134    Ratings of Perceived Exertion 11-13    Perceived Dyspnea 0-4      Progression   Progression Continue to progress workloads to maintain intensity without signs/symptoms of physical distress.      Resistance Training   Training Prescription Yes    Weight 3 lb    Reps 10-15  Perform Capillary Blood Glucose checks as needed.  Exercise Prescription Changes:   Exercise Prescription Changes     Row Name 07/14/21 1300 07/29/21 0800 08/13/21 0800 08/23/21 1100 09/06/21 1600     Response to Exercise   Blood Pressure (Admit) 110/68 114/66 134/70 102/68 146/70   Blood Pressure (Exercise) 126/72 134/70 148/78 138/74 --   Blood Pressure (Exit) 112/68 104/60 124/64 102/64 124/68   Heart Rate (Admit) 79 bpm 73 bpm 90 bpm 87 bpm 94 bpm   Heart Rate (Exercise) 97 bpm 103 bpm 94 bpm 100 bpm 107 bpm   Heart Rate (Exit) 75 bpm 85 bpm 89 bpm 90 bpm 98 bpm   Oxygen Saturation (Admit) 93 % 95 % 91 % 95 % 88 %   Oxygen Saturation (Exercise) 85 % 86 % 92 % 93 % 92 %   Oxygen Saturation (Exit) 96 % 97 % 93 % 96 % 98 %   Rating of Perceived Exertion (Exercise) 11  13 13 13 14    Perceived Dyspnea (Exercise) 2 3 1 3 3    Symptoms SOB, back pain 5/10, hip pain 4/10 SOB SOB SOB SOB   Comments walk test results -- -- -- --   Duration -- Progress to 30 minutes of  aerobic without signs/symptoms of physical distress Continue with 30 min of aerobic exercise without signs/symptoms of physical distress. Continue with 30 min of aerobic exercise without signs/symptoms of physical distress. Continue with 30 min of aerobic exercise without signs/symptoms of physical distress.   Intensity -- THRR unchanged THRR unchanged THRR unchanged THRR unchanged     Progression   Progression -- Continue to progress workloads to maintain intensity without signs/symptoms of physical distress. Continue to progress workloads to maintain intensity without signs/symptoms of physical distress. Continue to progress workloads to maintain intensity without signs/symptoms of physical distress. Continue to progress workloads to maintain intensity without signs/symptoms of physical distress.   Average METs -- 2.27 2.35 2.3 2.59     Resistance Training   Training Prescription -- Yes Yes Yes Yes   Weight -- 3 lb 3 lb 3 lb 6 lb   Reps -- 10-15 10-15 10-15 10-15     Interval Training   Interval Training -- -- -- -- No     Oxygen   Oxygen -- Continuous Continuous Continuous Continuous   Liters -- 4 4 4 3      NuStep   Level -- 4 4 6 4    Minutes -- 15 15 15 15    METs -- 2.4 2.4 2.8 2.6     REL-XR   Level -- -- -- -- 6   Minutes -- -- -- -- 15   METs -- -- -- -- 3.1     T5 Nustep   Level -- 3 5 -- --   Minutes -- 15 15 -- --   METs -- 2.4 2.3 -- --     Biostep-RELP   Level -- 2 -- -- --   Minutes -- 15 -- -- --   METs -- 2 -- -- --     Track   Laps -- 15 -- 24 20   Minutes -- 15 15 15 15    METs -- 1.82 -- 2.31 2.09     Oxygen   Maintain Oxygen Saturation -- 88% or higher 88% or higher 88% or higher 88% or higher    Row Name 09/09/21 1600 09/23/21 0800 10/05/21 1500 11/02/21  1600       Response to Exercise  Blood Pressure (Admit) -- 122/64 112/76 126/80    Blood Pressure (Exit) -- 112/64 118/60 128/74    Heart Rate (Admit) -- 100 bpm 77 bpm 80 bpm    Heart Rate (Exercise) -- 109 bpm 106 bpm 92 bpm    Heart Rate (Exit) -- 104 bpm 85 bpm 80 bpm    Oxygen Saturation (Admit) -- 98 % 98 % 95 %    Oxygen Saturation (Exercise) -- 90 % 88 % 94 %    Oxygen Saturation (Exit) -- 97 % 96 % 98 %    Rating of Perceived Exertion (Exercise) -- 13 14 11     Perceived Dyspnea (Exercise) -- 2 2 3     Symptoms -- SOB SOB SOB    Duration -- Continue with 30 min of aerobic exercise without signs/symptoms of physical distress. Continue with 30 min of aerobic exercise without signs/symptoms of physical distress. Continue with 30 min of aerobic exercise without signs/symptoms of physical distress.    Intensity -- THRR unchanged THRR unchanged THRR unchanged      Progression   Progression -- Continue to progress workloads to maintain intensity without signs/symptoms of physical distress. Continue to progress workloads to maintain intensity without signs/symptoms of physical distress. Continue to progress workloads to maintain intensity without signs/symptoms of physical distress.    Average METs -- 3 2.14 2.29      Resistance Training   Training Prescription -- Yes Yes Yes    Weight -- 6 lb 6 lb 6 lb  added bands as well    Reps -- 10-15 10-15 10-15      Interval Training   Interval Training -- No No No      Oxygen   Oxygen -- Continuous Continuous Continuous    Liters -- 3 4 4       Recumbant Bike   Level -- 6 6 --    Watts -- 25 39 --    Minutes -- 15 15 --    METs -- 2.8 2.78 --      NuStep   Level -- 5 5 --    Minutes -- 15 15 --    METs -- 3 2.1 --      REL-XR   Level -- -- 1 2    Minutes -- -- 15 15    METs -- -- -- 2.5      T5 Nustep   Level -- 5 -- 5    Minutes -- 15 -- 15    METs -- 2.4 -- --      Track   Laps -- -- 10 20    Minutes -- -- 15 15     METs -- -- 1.54 2.09      Home Exercise Plan   Plans to continue exercise at Home (comment)  walking, staff videos Home (comment)  walking, staff videos Home (comment)  walking, staff videos Home (comment)  walking, staff videos    Frequency Add 2 additional days to program exercise sessions. Add 2 additional days to program exercise sessions. Add 2 additional days to program exercise sessions. Add 2 additional days to program exercise sessions.    Initial Home Exercises Provided 09/09/21 09/09/21 09/09/21 09/09/21      Oxygen   Maintain Oxygen Saturation -- 88% or higher 88% or higher 88% or higher             Exercise Comments:   Exercise Comments     Row Name 07/19/21 1533  Exercise Comments First full day of exercise!  Patient was oriented to gym and equipment including functions, settings, policies, and procedures.  Patient's individual exercise prescription and treatment plan were reviewed.  All starting workloads were established based on the results of the 6 minute walk test done at initial orientation visit.  The plan for exercise progression was also introduced and progression will be customized based on patient's performance and goals.                Exercise Goals and Review:   Exercise Goals     Row Name 07/14/21 1309             Exercise Goals   Increase Physical Activity Yes       Intervention Provide advice, education, support and counseling about physical activity/exercise needs.;Develop an individualized exercise prescription for aerobic and resistive training based on initial evaluation findings, risk stratification, comorbidities and participant's personal goals.       Expected Outcomes Short Term: Attend rehab on a regular basis to increase amount of physical activity.;Long Term: Add in home exercise to make exercise part of routine and to increase amount of physical activity.;Long Term: Exercising regularly at least 3-5 days a week.        Increase Strength and Stamina Yes       Intervention Provide advice, education, support and counseling about physical activity/exercise needs.;Develop an individualized exercise prescription for aerobic and resistive training based on initial evaluation findings, risk stratification, comorbidities and participant's personal goals.       Expected Outcomes Short Term: Increase workloads from initial exercise prescription for resistance, speed, and METs.;Short Term: Perform resistance training exercises routinely during rehab and add in resistance training at home;Long Term: Improve cardiorespiratory fitness, muscular endurance and strength as measured by increased METs and functional capacity (6MWT)       Able to understand and use rate of perceived exertion (RPE) scale Yes       Intervention Provide education and explanation on how to use RPE scale       Expected Outcomes Short Term: Able to use RPE daily in rehab to express subjective intensity level;Long Term:  Able to use RPE to guide intensity level when exercising independently       Able to understand and use Dyspnea scale Yes       Intervention Provide education and explanation on how to use Dyspnea scale       Expected Outcomes Short Term: Able to use Dyspnea scale daily in rehab to express subjective sense of shortness of breath during exertion;Long Term: Able to use Dyspnea scale to guide intensity level when exercising independently       Knowledge and understanding of Target Heart Rate Range (THRR) Yes       Intervention Provide education and explanation of THRR including how the numbers were predicted and where they are located for reference       Expected Outcomes Short Term: Able to state/look up THRR;Long Term: Able to use THRR to govern intensity when exercising independently;Short Term: Able to use daily as guideline for intensity in rehab       Able to check pulse independently Yes       Intervention Provide education and demonstration  on how to check pulse in carotid and radial arteries.;Review the importance of being able to check your own pulse for safety during independent exercise       Expected Outcomes Short Term: Able to explain why pulse checking is  important during independent exercise;Long Term: Able to check pulse independently and accurately       Understanding of Exercise Prescription Yes       Intervention Provide education, explanation, and written materials on patient's individual exercise prescription       Expected Outcomes Short Term: Able to explain program exercise prescription;Long Term: Able to explain home exercise prescription to exercise independently                Exercise Goals Re-Evaluation :  Exercise Goals Re-Evaluation     Row Name 07/19/21 1533 07/29/21 0819 08/13/21 0819 08/23/21 1143 08/23/21 1608     Exercise Goal Re-Evaluation   Exercise Goals Review Increase Physical Activity;Knowledge and understanding of Target Heart Rate Range (THRR);Understanding of Exercise Prescription;Able to understand and use rate of perceived exertion (RPE) scale;Increase Strength and Stamina;Able to understand and use Dyspnea scale;Able to check pulse independently Increase Physical Activity;Increase Strength and Stamina;Understanding of Exercise Prescription Increase Physical Activity;Increase Strength and Stamina;Understanding of Exercise Prescription Increase Physical Activity;Increase Strength and Stamina;Understanding of Exercise Prescription Increase Physical Activity;Increase Strength and Stamina;Understanding of Exercise Prescription   Comments Reviewed RPE and dyspnea scales, THR and program prescription with pt today.  Pt voiced understanding and was given a copy of goals to take home. [name] has had a good start in rehab. He has increased his load to level 4 on the T4 machine. He has also tolerated 3 lb hand weights for resistance training. We will continue to monitor Bill's progress in the program.  [name] is doing well in rehab.  He is up to level 5 on the T5! We will encourage him to try 4 lb hand weights.  We will continue to monitor his progress. [name] is doing well in rehab. He increased to 24 laps on the track. He also improved to level 6 on the T4 machine as well. We will encourage him to try 4 lb hand weights for resistance training. We will continue to monitor his progress in the program. [name] reports staying active and working a lot. He is still a member of [employer].   Expected Outcomes Short: Use RPE daily to regulate intensity. Long: Follow program prescription in THR. Short: Continue to attend rehab. Long: Improve MET levels during exercise. Short: Try 4 lb hand weights Long: Continue to improve stamina Short: Try 4 lb hand weights Long: Continue to improve MET levels --    Row Name 09/06/21 1604 09/09/21 1615 09/23/21 0858 10/05/21 1528 10/18/21 1030     Exercise Goal Re-Evaluation   Exercise Goals Review Increase Physical Activity;Increase Strength and Stamina;Understanding of Exercise Prescription Increase Physical Activity;Increase Strength and Stamina;Understanding of Exercise Prescription;Able to understand and use rate of perceived exertion (RPE) scale;Able to understand and use Dyspnea scale;Knowledge and understanding of Target Heart Rate Range (THRR);Able to check pulse independently Increase Physical Activity;Increase Strength and Stamina;Understanding of Exercise Prescription Increase Physical Activity;Increase Strength and Stamina;Understanding of Exercise Prescription Increase Physical Activity;Increase Strength and Stamina;Understanding of Exercise Prescription   Comments [name] is doing well in rehab.  He is up to level 4 on the NuStep and level 6 on the XR.  He is also now using 6 lb weights.  He does have a difficult time arriving on time and says her keeps getting lost but has not brought it up to doctor yet.  We will continue to montior his progress. Reviewed home  exercise with pt today.  Pt plans to continue walking at home for exercise.  He has  been walking with work, but not consistent enough to count as exercise, so we talked about adding his time together at once.  Reviewed THR, pulse, RPE, sign and symptoms, pulse oximetery and when to call 911 or MD.  Also discussed weather considerations and indoor options.  Pt voiced understanding. [name] continues to do well in rehab.  He has increased to level 5 on both the T4 and T5 Nustep. His oxygen is staying above 88% during exercise and RPEs are appropriate. He did not walk much last time and will encourage him to increase his amount of walking overtime. Will continue to monitor. [name] is doing well in rehab. He improved to level 6 on the bike. He did have to go back up from 3L to 4L of oxygen during exercise. Also, his overall average METs did go down a little since the last evaluation to 2.14 METs. We will continue to monitor his progress in the program. [name] has not been to rehab since last review. He ended up going to the ED last time he was here for chest pain- which turned out to not be cardiac related, per the notes. He wanted to see his cardiologist first before resuming back. We will continue to follow up with patient after he sees his MD.   Expected Outcomes Short: Continue to move up on workloads Long: Continue to improve stamina Short: Add in more time together to get 30 min Long: Conitnue to improve stamina Short: Increase walking and get laps up on track Long: Continue to increase overall MET level Short: Continue to push for more laps on track. Long: Continue to increase overall MET level. Short: Clear to come back to rehab Long: Continue to build up overall strength and stamina    Row Name 11/02/21 1606             Exercise Goal Re-Evaluation   Exercise Goals Review Increase Physical Activity;Increase Strength and Stamina;Understanding of Exercise Prescription       Comments Bill returned to rehab at  end of last week.  He was able to return to his previous workloads without a problem.  We will continue to monitor his progress.       Expected Outcomes Short: Continue to attend regularly again Long: conitnue to rebuild stamina                Discharge Exercise Prescription (Final Exercise Prescription Changes):  Exercise Prescription Changes - 11/02/21 1600       Response to Exercise   Blood Pressure (Admit) 126/80    Blood Pressure (Exit) 128/74    Heart Rate (Admit) 80 bpm    Heart Rate (Exercise) 92 bpm    Heart Rate (Exit) 80 bpm    Oxygen Saturation (Admit) 95 %    Oxygen Saturation (Exercise) 94 %    Oxygen Saturation (Exit) 98 %    Rating of Perceived Exertion (Exercise) 11    Perceived Dyspnea (Exercise) 3    Symptoms SOB    Duration Continue with 30 min of aerobic exercise without signs/symptoms of physical distress.    Intensity THRR unchanged      Progression   Progression Continue to progress workloads to maintain intensity without signs/symptoms of physical distress.    Average METs 2.29      Resistance Training   Training Prescription Yes    Weight 6 lb   added bands as well   Reps 10-15      Interval Training   Interval Training  No      Oxygen   Oxygen Continuous    Liters 4      REL-XR   Level 2    Minutes 15    METs 2.5      T5 Nustep   Level 5    Minutes 15      Track   Laps 20    Minutes 15    METs 2.09      Home Exercise Plan   Plans to continue exercise at Home (comment)   walking, staff videos   Frequency Add 2 additional days to program exercise sessions.    Initial Home Exercises Provided 09/09/21      Oxygen   Maintain Oxygen Saturation 88% or higher             Nutrition:  Target Goals: Understanding of nutrition guidelines, daily intake of sodium <1559m, cholesterol <2066m calories 30% from fat and 7% or less from saturated fats, daily to have 5 or more servings of fruits and vegetables.  Education: All About  Nutrition: -Group instruction provided by verbal, written material, interactive activities, discussions, models, and posters to present general guidelines for heart healthy nutrition including fat, fiber, MyPlate, the role of sodium in heart healthy nutrition, utilization of the nutrition label, and utilization of this knowledge for meal planning. Follow up email sent as well. Written material given at graduation. Flowsheet [facility]  Date 09/22/21  [facility] Code 1- Verbalizes Understanding       Biometrics:  Pre Biometrics - 07/14/21 1300       Pre Biometrics   Height 6' 0.5" (1.842 m)    Weight 209 lb 12.8 oz (95.2 kg)    BMI (Calculated) 28.05    Single Leg Stand 9 seconds              Nutrition Therapy Plan and Nutrition Goals:  Nutrition Therapy & Goals - 09/29/21 1544       Nutrition Therapy   Diet Heart healthy, low Na, pulmonary MNT    Drug/Food Interactions Statins/Certain Fruits    Protein (specify units) 120g    Fiber 30 grams    Whole Grain Foods 3 servings    Saturated Fats 16 max. grams    Fruits and Vegetables 8 servings/day    Sodium 2 grams      Personal Nutrition Goals   Nutrition Goal ST: practice MyPlate guidelines  LT: meet calorie and protein needs while eating a heart healthy diet    Comments [age].o. M admitted to pulmonary rehab for ILD. PMHx includes CAD, pulmonary embolism, HTN, CAD, malignant neoplasm of R lung, pulmonary fibrosis. Relevant medications includes xanax, lipitor, vit D3, norco, protonic, paxil, prednisone, probiotic. PYP Score: 40. Vegetables & Fruits 5/12. Breads, Grains & Cereals 5/12. Red & Processed Meat 4/12. Poultry 0/2. Fish & Shellfish 2/4. Beans, Nuts & Seeds 3/4. Milk & Dairy Foods 2/6. Toppings, Oils, Seasonings & Salt 8/20. Sweets, Snacks & Restaurant Food 4/14. Beverages 7/10. He feels fatigued a lot of the time; he feels this is due to  his shortness of breath. He feels that his appetite is lower than his baseline; has been that way for a couple of months and feels it is because of his [place]. B: Boost with cliff bar or sometimes a couple eggs with sausage and toast. S: fruit, Belvita, or cliff bar (grazing) D: TV dinners 1-2x/week or protein (red meat:  grilled chuck and grilled steaks, chicken and fish less often) and vegetables (squash, cucumber, potatoes, broccoli) S: belvita. He reports that he is the one to normally cook dinner - he uses vegetable oil, he uses salt, but has cut back. Drinks: iced tea (slightly sweet tea -pure leaf), water with flavoring, diet soda (caffiene free). Bill sat in on nutrition education last week and reports he has no questions at this time; reviewed heart healthy eating and MyPlate structure. Discussed pulmonary MNT and the importance of meeting energy and protein needs as well as vitamin D and calcium while on steroids. He feels that his biggest barrier is cooking the food he has; he reports buying all the right food, but not cooking a lot of it due to fatigue. Discussed meal planning, ingredient prepping, and frozen vegetables. He is open to making changes.      Intervention Plan   Intervention Prescribe, educate and counsel regarding individualized specific dietary modifications aiming towards targeted core components such as weight, hypertension, lipid management, diabetes, heart failure and other comorbidities.    Expected Outcomes Short Term Goal: Understand basic principles of dietary content, such as calories, fat, sodium, cholesterol and nutrients.;Short Term Goal: A plan has been developed with personal nutrition goals set during dietitian appointment.;Long Term Goal: Adherence to prescribed nutrition plan.             Nutrition Assessments:  MEDIFICTS Score Key: ?70 Need to make dietary changes  40-70 Heart Healthy Diet ? 40 Therapeutic Level Cholesterol Diet  Flowsheet Row Pulmonary  Rehab from 07/14/2021 in [facility]  Picture Your Plate Total Score on Admission 40      Picture Your Plate Scores: <85 Unhealthy dietary pattern with much room for improvement. 41-50 Dietary pattern unlikely to meet recommendations for good health and room for improvement. 51-60 More healthful dietary pattern, with some room for improvement.  >60 Healthy dietary pattern, although there may be some specific behaviors that could be improved.   Nutrition Goals Re-Evaluation:  Nutrition Goals Re-Evaluation     [place] Name 07/22/21 1546 08/23/21 1628 09/09/21 1540         Goals   Nutrition Goal Meet with the dietitian. -- Resheduled nutrition appt. for 09/21/21 at [place] did mention that he was having difficulty with waking up hungry in the middle of the night - discussed that it could be he is not meeting his needs during the day, encouraged to pay attention to hunger cues. Discussed heart healhty snacking.     Comment Bill would like to meet with the dietitian. Resheduled nutrition appt. for 09/06/21 at 3pm. Resheduled nutrition appt. for 09/21/21 at [place] did mention that he was having difficulty with waking up hungry in the middle of the night - discussed that it could be he is not meeting his needs during the day, encouraged to pay attention to hunger cues. Discussed heart healhty snacking.     Expected Outcome Short: meet with the dietitian. Long: adhere to a diet that pertains to him. -- --              Nutrition Goals Discharge (Final Nutrition Goals Re-Evaluation):  Nutrition Goals Re-Evaluation - 09/09/21 1540       Goals   Nutrition Goal Resheduled nutrition appt. for 09/21/21 at [place] did mention that he was having difficulty with waking up hungry in the middle of the night - discussed that it could be he is not meeting his needs during  the day, encouraged to pay attention to hunger cues. Discussed heart healhty snacking.    Comment Resheduled  nutrition appt. for 09/21/21 at [place] did mention that he was having difficulty with waking up hungry in the middle of the night - discussed that it could be he is not meeting his needs during the day, encouraged to pay attention to hunger cues. Discussed heart healhty snacking.             Psychosocial: Target Goals: Acknowledge presence or absence of significant depression and/or stress, maximize coping skills, provide positive support system. Participant is able to verbalize types and ability to use techniques and skills needed for reducing stress and depression.   Education: Stress, Anxiety, and Depression - Group verbal and visual presentation to define topics covered.  Reviews how body is impacted by stress, anxiety, and depression.  Also discusses healthy ways to reduce stress and to treat/manage anxiety and depression.  Written material given at graduation.   Education: Sleep Hygiene -Provides group verbal and written instruction about how sleep can affect your health.  Define sleep hygiene, discuss sleep cycles and impact of sleep habits. Review good sleep hygiene tips.    Initial Review & Psychosocial Screening:  Initial Psych Review & Screening - 07/05/21 1144       Initial Review   Current issues with Current Anxiety/Panic   occassional anxiety has Xanax for this     Family Dynamics   Good Support System? Yes   wife, 43 years     Barriers   Psychosocial barriers to participate in program There are no identifiable barriers or psychosocial needs.      Screening Interventions   Interventions Encouraged to exercise    Expected Outcomes Short Term goal: Utilizing psychosocial counselor, staff and physician to assist with identification of specific Stressors or current issues interfering with healing process. Setting desired goal for each stressor or current issue identified.;Long Term Goal: Stressors or current issues are controlled or eliminated.;Short Term goal:  Identification and review with participant of any Quality of Life or Depression concerns found by scoring the questionnaire.;Long Term goal: The participant improves quality of Life and PHQ9 Scores as seen by post scores and/or verbalization of changes             Quality of Life Scores:  Scores of 19 and below usually indicate a poorer quality of life in these areas.  A difference of  2-3 points is a clinically meaningful difference.  A difference of 2-3 points in the total score of the Quality of Life Index has been associated with significant improvement in overall quality of life, self-image, physical symptoms, and general health in studies assessing change in quality of life.  PHQ-9: Review Flowsheet  More data exists      07/14/2021 06/01/2016 12/30/2015 11/18/2015 10/13/2015  Depression screen PHQ 2/9  Decreased Interest 1 0 0 - 0  Down, Depressed, Hopeless 1 0 0 0 0  PHQ - 2 Score 2 0 0 0 0  Altered sleeping 2 - - - -  Tired, decreased energy 2 - - - -  Change in appetite 2 - - - -  Feeling bad or failure about yourself  1 - - - -  Trouble concentrating 1 - - - -  Moving slowly or fidgety/restless 0 - - - -  Suicidal thoughts 0 - - - -  PHQ-9 Score 10 - - - -  Difficult doing work/chores Somewhat difficult - - - -  Interpretation of Total Score  Total Score Depression Severity:  1-4 = Minimal depression, 5-9 = Mild depression, 10-14 = Moderate depression, 15-19 = Moderately severe depression, 20-27 = Severe depression   Psychosocial Evaluation and Intervention:  Psychosocial Evaluation - 07/05/21 1159       Psychosocial Evaluation & Interventions   Interventions Encouraged to exercise with the program and follow exercise prescription;Relaxation education    Comments [name] has no barriers to attending the program. He is looking forward to regaining his strength and stamina. 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 He lives  with his wife and their fur babies. She is his support. THey havebeen married for 43 years. He wants to get back to his previou strength. He does have meds as needed for anxiety. He does not need the medicine too frequently.  He has lost over 20 pounds after his surgery. Heis ready to see if he can gain back energy and musclemass he has lost    Expected Outcomes STG [name] is able to attend all scheduled sessions, He continues to remain tobacco free.   LTG BIll continues to have progressive improvement with his energy and strength, He uses his anxiety meds even less after discharge    Continue Psychosocial Services  Follow up required by staff             Psychosocial Re-Evaluation:  Psychosocial Re-Evaluation     [place] Name 07/22/21 1547 08/23/21 1608 09/09/21 1554         Psychosocial Re-Evaluation   Current issues with Current Depression;History of Depression;Current Psychotropic Meds Current Depression;History of Depression;Current Psychotropic Meds;Current Sleep Concerns Current Depression;History of Depression;Current Psychotropic Meds;Current Sleep Concerns     Comments He states that his mood has improved since he is over the effects of his lung surgery. It took him 3 months to feel better. His mood with him being able to exercise and do more has helped improve his mood dramatically. He has some properties to take care of and wants to do rehab two days a week. He reports his mood has been better with no depressive symptoms recently - he thinks the vacation helped! He went to beach with his wife, nephews, sister-in-law. He went fishing as well and is excited about a fishing trip he is taking in September. He is still taking medication as needed and feels it is helping. He can rely on his wife for support. He loves his dogs, friends and family. He will think of something else when his mind gets stressed. He reports not sleeping well - he sleeps in the same bed as one of the dogs and some of them  will wake him up. He reports his mood has been better with no depressive symptoms recently. He reports even other people has noticed as well and especially his wife. He enjoys fishing and is excited about a fishing trip he is taking in September, he is alos excited to bow hunt and go walking outside. He is still taking medication as needed and feels it is helping. He can rely on his wife for support. He loves his dogs, friends and family. He will think of something else when his mind gets stressed. He reports not sleeping well, but it has been getting better due to physical activity - he sleeps in the same bed as one of the dogs and some of them will wake him up.     Expected  Outcomes Short: Start [facility] to help with mood. Long: Maintain a healthy mental state Short: Continue to attend [facility] to help with mood. Long: Maintain a healthy mental state Short: Continue to attend [facility] to help with mood. Long: Maintain a healthy mental state     Interventions Encouraged to attend Pulmonary Rehabilitation for the exercise Encouraged to attend Pulmonary Rehabilitation for the exercise Encouraged to attend Pulmonary Rehabilitation for the exercise     Continue Psychosocial Services  Follow up required by staff Follow up required by staff Follow up required by staff              Psychosocial Discharge (Final Psychosocial Re-Evaluation):  Psychosocial Re-Evaluation - 09/09/21 1554       Psychosocial Re-Evaluation   Current issues with Current Depression;History of Depression;Current Psychotropic Meds;Current Sleep Concerns    Comments He reports his mood has been better with no depressive symptoms recently. He reports even other people has noticed as well and especially his wife. He enjoys fishing and is excited about a fishing trip he is taking in September, he is alos excited to bow hunt and go walking outside. He is still taking medication as needed and feels it is helping. He can rely on his wife  for support. He loves his dogs, friends and family. He will think of something else when his mind gets stressed. He reports not sleeping well, but it has been getting better due to physical activity - he sleeps in the same bed as one of the dogs and some of them will wake him up.    Expected Outcomes Short: Continue to attend [facility] to help with mood. Long: Maintain a healthy mental state    Interventions Encouraged to attend Pulmonary Rehabilitation for the exercise    Continue Psychosocial Services  Follow up required by staff             Education: Education Goals: Education classes will be provided on a weekly basis, covering required topics. Participant will state understanding/return demonstration of topics presented.  Learning Barriers/Preferences:  Learning Barriers/Preferences - 07/05/21 1158       Learning Barriers/Preferences   Learning Barriers Hearing    Learning Preferences None             General Pulmonary Education Topics:  Infection Prevention: - Provides verbal and written material to individual with discussion of infection control including proper hand washing and proper equipment cleaning during exercise session. Flowsheet Row Pulmonary Rehab from 10/06/2021 in [facility] Cardiac and Pulmonary Rehab  Education need identified 07/14/21  Date 07/14/21  Educator [place]  Instruction Review Code 1- Verbalizes Understanding       Falls Prevention: - Provides verbal and written material to individual with discussion of falls prevention and safety. Flowsheet Row Pulmonary Rehab from 07/05/2021 in [facility] Cardiac and Pulmonary Rehab  Date 07/05/21  Educator SB  Instruction Review Code 1- Verbalizes Understanding       Chronic Lung Disease Review: - Group verbal instruction with posters, models, PowerPoint presentations and videos,  to review new updates, new respiratory medications, new advancements in procedures and treatments. Providing information on websites and  "800" numbers for continued self-education. Includes information about supplement oxygen, available portable oxygen systems, continuous and intermittent flow rates, oxygen safety, concentrators, and Medicare reimbursement for oxygen. Explanation of Pulmonary Drugs, including class, frequency, complications, importance of spacers, rinsing mouth after steroid MDI's, and proper cleaning methods for nebulizers. Review of basic lung anatomy and physiology related to function, structure, and  complications of lung disease. Review of risk factors. Discussion about methods for diagnosing sleep apnea and types of masks and machines for OSA. Includes a review of the use of types of environmental controls: home humidity, furnaces, filters, dust mite/pet prevention, HEPA vacuums. Discussion about weather changes, air quality and the benefits of nasal washing. Instruction on Warning signs, infection symptoms, calling MD promptly, preventive modes, and value of vaccinations. Review of effective airway clearance, coughing and/or vibration techniques. Emphasizing that all should Create an Action Plan. Written material given at graduation. Flowsheet Row Pulmonary Rehab from 10/06/2021 in [facility] Cardiac and Pulmonary Rehab  Education need identified 07/14/21       AED/CPR: - Group verbal and written instruction with the use of models to demonstrate the basic use of the AED with the basic ABC's of resuscitation.    Anatomy and Cardiac Procedures: - Group verbal and visual presentation and models provide information about basic cardiac anatomy and function. Reviews the testing methods done to diagnose heart disease and the outcomes of the test results. Describes the treatment choices: Medical Management, Angioplasty, or Coronary Bypass Surgery for treating various heart conditions including Myocardial Infarction, Angina, Valve Disease, and Cardiac Arrhythmias.  Written material given at graduation.   Medication Safety: -  Group verbal and visual instruction to review commonly prescribed medications for heart and lung disease. Reviews the medication, class of the drug, and side effects. Includes the steps to properly store meds and maintain the prescription regimen.  Written material given at graduation.   Other: -Provides group and verbal instruction on various topics (see comments)   Knowledge Questionnaire Score:  Knowledge Questionnaire Score - 07/14/21 1227       Knowledge Questionnaire Score   Pre Score 15/18              Core Components/Risk Factors/Patient Goals at Admission:  Personal Goals and Risk Factors at Admission - 07/14/21 1309       Core Components/Risk Factors/Patient Goals on Admission    Weight Management Yes;Weight Loss   Patient lost 25 pounds since Jan 2023, wants to gain muscle   Intervention Weight Management: Develop a combined nutrition and exercise program designed to reach desired caloric intake, while maintaining appropriate intake of nutrient and fiber, sodium and fats, and appropriate energy expenditure required for the weight goal.;Weight Management: Provide education and appropriate resources to help participant work on and attain dietary goals.;Weight Management/Obesity: Establish reasonable short term and long term weight goals.    Admit Weight 209 lb (94.8 kg)   was 231 prior to his surgery in Jan 2023   Goal Weight: Short Term 204 lb (92.5 kg)    Goal Weight: Long Term 195 lb (88.5 kg)    Expected Outcomes Short Term: Continue to assess and modify interventions until short term weight is achieved;Weight Loss: Understanding of general recommendations for a balanced deficit meal plan, which promotes 1-2 lb weight loss per week and includes a negative energy balance of [phone] kcal/d;Understanding recommendations for meals to include 15-35% energy as protein, 25-35% energy from fat, 35-60% energy from carbohydrates, less than 28m of dietary cholesterol, 20-35 gm of  total fiber daily;Understanding of distribution of calorie intake throughout the day with the consumption of 4-5 meals/snacks;Long Term: Adherence to nutrition and physical activity/exercise program aimed toward attainment of established weight goal    Improve shortness of breath with ADL's Yes    Intervention Provide education, individualized exercise plan and daily activity instruction to help decrease symptoms of SOB  with activities of daily living.    Expected Outcomes Short Term: Improve cardiorespiratory fitness to achieve a reduction of symptoms when performing ADLs    Increase knowledge of respiratory medications and ability to use respiratory devices properly  Yes    Intervention Provide education and demonstration as needed of appropriate use of medications, inhalers, and oxygen therapy.    Expected Outcomes Short Term: Achieves understanding of medications use. Understands that oxygen is a medication prescribed by physician. Demonstrates appropriate use of inhaler and oxygen therapy.;Long Term: Maintain appropriate use of medications, inhalers, and oxygen therapy.    Hypertension Yes    Intervention Provide education on lifestyle modifcations including regular physical activity/exercise, weight management, moderate sodium restriction and increased consumption of fresh fruit, vegetables, and low fat dairy, alcohol moderation, and smoking cessation.;Monitor prescription use compliance.    Expected Outcomes Short Term: Continued assessment and intervention until BP is < 140/67m HG in hypertensive participants. < 130/810mHG in hypertensive participants with diabetes, heart failure or chronic kidney disease.;Long Term: Maintenance of blood pressure at goal levels.    Lipids Yes    Intervention Provide education and support for participant on nutrition & aerobic/resistive exercise along with prescribed medications to achieve LDL <7068mHDL >90m68m  Expected Outcomes Short Term: Participant states  understanding of desired cholesterol values and is compliant with medications prescribed. Participant is following exercise prescription and nutrition guidelines.;Long Term: Cholesterol controlled with medications as prescribed, with individualized exercise RX and with personalized nutrition plan. Value goals: LDL < 70mg31mL > 40 mg.             Education:Diabetes - Individual verbal and written instruction to review signs/symptoms of diabetes, desired ranges of glucose level fasting, after meals and with exercise. Acknowledge that pre and post exercise glucose checks will be done for 3 sessions at entry of program.   Know Your Numbers and Heart Failure: - Group verbal and visual instruction to discuss disease risk factors for cardiac and pulmonary disease and treatment options.  Reviews associated critical values for Overweight/Obesity, Hypertension, Cholesterol, and Diabetes.  Discusses basics of heart failure: signs/symptoms and treatments.  Introduces Heart Failure Zone chart for action plan for heart failure.  Written material given at graduation. Flowsheet Row [facility]  Education need identified 10/06/21  Date 10/06/21  Educator SB  Instruction Review Code 1- Verbalizes Understanding       Core Components/Risk Factors/Patient Goals Review:   Goals and Risk Factor Review     Row Name 07/22/21 1545 08/23/21 1603 09/09/21 1541 10/27/21 1617       Core Components/Risk Factors/Patient Goals Review   Personal Goals Review Improve shortness of breath with ADL's Improve shortness of breath with ADL's Improve shortness of breath with ADL's;Weight Management/Obesity;Hypertension Improve shortness of breath with ADL's;Weight Management/Obesity;Hypertension    Review Spoke to patient about their shortness of breath and what they can do to improve. Patient has been informed of breathing techniques when starting the program. Patient is  informed to tell staff if they have had any med changes and that certain meds they are taking or not taking can be causing shortness of breath. Bill reports his shortness of breath has been improving since starting rehab. He checks his O2 all the time and keeps a pulse oximeter on him - he makes sure his O2 does not dip below 88. He practices PLB when he gets short of breath. He also practices diaphragmatic breathing  laying down. His weight was elevated 6lbs since 2 weeks ago - he went on vacation and ate out a lot. He is taking all of his medications as prescribed. Only issue with Esbriet which is costly and is giving him difficulty with his memory. Encouraged him to make MD aware if he hasn't already. Bill reports his weight is back up, 6 pounds in less than a week. He reports not being on a diuretic right now, he did have more salt that weekend, but does not have additional swelling or shortness of breath; encouraged him to let his doctor know as this could be fluid buildup. Bill reports his shortness of breath has gotten worse about a month ago. He will take breaks as needed when performing ADLs; he has two pulse oximeters which he uses - he knows his O2 should stay above 88%. He continues to practice diaphragmatic breathing. He continues to use PLB when he has shortness of breath and feels this helps. Bill reports checking his BP and reports the top number can get high 140s/150s over 80 or so. Today his BP was 112/66. He continues to take his medications as dirested with no issues. [name] has been out of rehab since 10/06/21 when he was sent to the ER for chest tightness and shortness of breath. Bill reports that the doctor changed his medications and would like to monitor his progress. He is excited to return to rehab. He reports since then he hasn't had to take any nitro and reports feeling well and being more active. Bill reports he recently went to the beach where he went swimming and fishing. He plans to  resume activity at home walking around his property, but he continues to get short of breath - encouraged him to continue to check his O2 with a pulse oximeter. He continues to take his medications as prescribed, but he has some insomia and lower appetite. Discussed mechanical eating if he continues to have a lower appetite.    Expected Outcomes Short: Attend [facility] regularly to improve shortness of breath with ADL's. Long: maintain independence with ADL's Short: Attend [facility] regularly to improve shortness of breath with ADL's. Long: maintain independence with ADL's Short: Attend [facility] regularly to improve shortness of breath with ADL's. Long: maintain independence with ADL's Short: Attend [facility] regularly to improve shortness of breath with ADL's. Long: maintain independence with ADL's             Core Components/Risk Factors/Patient Goals at Discharge (Final Review):   Goals and Risk Factor Review - 10/27/21 1617       Core Components/Risk Factors/Patient Goals Review   Personal Goals Review Improve shortness of breath with ADL's;Weight Management/Obesity;Hypertension    Review [name] has been out of rehab since 10/06/21 when he was sent to the ER for chest tightness and shortness of breath. Bill reports that the doctor changed his medications and would like to monitor his progress. He is excited to return to rehab. He reports since then he hasn't had to take any nitro and reports feeling well and being more active. Bill reports he recently went to the beach where he went swimming and fishing. He plans to resume activity at home walking around his property, but he continues to get short of breath - encouraged him to continue to check his O2 with a pulse oximeter. He continues to take his medications as prescribed, but he has some insomia and lower appetite. Discussed mechanical eating if he continues to have a lower  appetite.    Expected Outcomes Short: Attend [facility] regularly to  improve shortness of breath with ADL's. Long: maintain independence with ADL's             ITP Comments:  ITP Comments     Row Name 07/05/21 1155 07/14/21 1225 07/19/21 1532 08/11/21 1133 09/08/21 0755   ITP Comments Virtual orientation call completed today. he has an appointment on Date: 05/17/20253  for EP eval and gym Orientation.  Documentation of diagnosis can be found in [facility]  Date: 06/17/2021 .      Daulton is a former tobacco user. Intervention for tobacco cessation was provided at the initial medical review. He was asked about  quitting and reported he quit all in Nov 2022 . Staff will continue to provide encouragement and follow up with the patient throughout the program to prevent relapse. Completed 6MWT and gym orientation. Initial ITP created and sent for review to Dr. Ottie Glazier, Medical Director. First full day of exercise!  Patient was oriented to gym and equipment including functions, settings, policies, and procedures.  Patient's individual exercise prescription and treatment plan were reviewed.  All starting workloads were established based on the results of the 6 minute walk test done at initial orientation visit.  The plan for exercise progression was also introduced and progression will be customized based on patient's performance and goals. 30 Day review completed. Medical Director ITP review done, changes made as directed, and signed approval by Medical Director. 30 Day review completed. Medical Director ITP review done, changes made as directed, and signed approval by Medical Director.    [place] Name 10/06/21 0700 10/14/21 0824 10/26/21 1442 11/03/21 0828     ITP Comments 30 Day review completed. Medical Director ITP review done, changes made as directed, and signed approval by Medical Director. Patient went to ED on 8/9. Needs to see cardiology for follow up first to clear to come back and manage his medications appropriately. Place on medical hold until he sees MD. Patient  was cleared by his cardiologist to return to Pulmonary Rehab. Called patient and left voicemail to follow up. 30 Day review completed. Medical Director ITP review done, changes made as directed, and signed approval by Medical Director.             Comments:

## 2021-11-03 NOTE — Progress Notes (Signed)
Daily Session Note  Patient Details  Name: Richard Parrish MRN: [id number] Date of Birth: [birth year] Referring Provider:   Flowsheet Row Pulmonary Rehab from 07/14/2021 in [facility] Cardiac and Pulmonary Rehab  Referring Provider Marshell Garfinkel MD       Encounter Date: 11/03/2021  Check In:  Session Check In - 11/03/21 1547       Check-In   Supervising physician immediately available to respond to emergencies See telemetry face sheet for immediately available ER MD    Location [facility]-Cardiac & Pulmonary Rehab    Staff Present Renita Papa, RN BSN;Laureen Owens Shark, BS, RRT, CPFT;Joseph [place], [place]    Virtual Visit No    Medication changes reported     No    Fall or balance concerns reported    No    Warm-up and Cool-down Performed on first and last piece of equipment    Resistance Training Performed Yes    VAD Patient? No    PAD/SET Patient? No      Pain Assessment   Currently in Pain? No/denies                Social History   Tobacco Use  Smoking Status Former   Packs/day: 2.00   Years: 15.00   Total pack years: 30.00   Types: Cigarettes   Quit date: 03/01/1987   Years since quitting: 34.7  Smokeless Tobacco Never  Tobacco Comments   03/02/21-Pt instructed to not vape or drink alcohol for 24 hours prior to surgery.     Goals Met:  Independence with exercise equipment Exercise tolerated well No report of concerns or symptoms today Strength training completed today  Goals Unmet:  Not Applicable  Comments: Pt able to follow exercise prescription today without complaint.  Will continue to monitor for progression.    Dr. Emily Filbert is Medical Director for [place].  Dr. Ottie Glazier is Medical Director for [facility] Pulmonary Rehabilitation.

## 2021-11-04 ENCOUNTER — Encounter: Payer: PPO | Admitting: *Deleted

## 2021-11-04 DIAGNOSIS — J849 Interstitial pulmonary disease, unspecified: Secondary | ICD-10-CM

## 2021-11-04 NOTE — Progress Notes (Signed)
Daily Session Note  Patient Details  Name: OWIN VIGNOLA MRN: [id number] Date of Birth: November 12, 1950 Referring Provider:   Flowsheet Row Pulmonary Rehab from 07/14/2021 in [facility] Cardiac and Pulmonary Rehab  Referring Provider Marshell Garfinkel MD       Encounter Date: 11/04/2021  Check In:  Session Check In - 11/04/21 1551       Check-In   Supervising physician immediately available to respond to emergencies See telemetry face sheet for immediately available ER MD    Location [facility]-Cardiac & Pulmonary Rehab    Staff Present Renita Papa, RN BSN;Joseph Kidron, RCP,RRT,BSRT;Kara Boyce, [place], ASCM CEP, Exercise Physiologist    Virtual Visit No    Medication changes reported     No    Fall or balance concerns reported    No    Warm-up and Cool-down Performed on first and last piece of equipment    Resistance Training Performed Yes    VAD Patient? No    PAD/SET Patient? No      Pain Assessment   Currently in Pain? No/denies                Social History   Tobacco Use  Smoking Status Former   Packs/day: 2.00   Years: 15.00   Total pack years: 30.00   Types: Cigarettes   Quit date: 03/01/1987   Years since quitting: 34.7  Smokeless Tobacco Never  Tobacco Comments   03/02/21-Pt instructed to not vape or drink alcohol for 24 hours prior to surgery.     Goals Met:  Independence with exercise equipment Exercise tolerated well No report of concerns or symptoms today Strength training completed today  Goals Unmet:  Not Applicable  Comments: Pt able to follow exercise prescription today without complaint.  Will continue to monitor for progression.    Dr. Emily Filbert is Medical Director for [place].  Dr. Ottie Glazier is Medical Director for [facility] Pulmonary Rehabilitation.

## 2021-11-09 ENCOUNTER — Other Ambulatory Visit (HOSPITAL_COMMUNITY): Payer: Self-pay

## 2021-11-09 NOTE — Telephone Encounter (Signed)
Submitted a Prior Authorization request to  [employer] w [employer]  for PIRFENIDONE 801mg  tablets via CoverMyMeds. Will update once we receive a response.  Key: [id number]  Knox Saliva, PharmD, MPH, BCPS, CPP Clinical Pharmacist (Rheumatology and Pulmonology)

## 2021-11-10 ENCOUNTER — Encounter: Payer: PPO | Admitting: *Deleted

## 2021-11-10 ENCOUNTER — Other Ambulatory Visit (HOSPITAL_COMMUNITY): Payer: Self-pay

## 2021-11-10 DIAGNOSIS — J849 Interstitial pulmonary disease, unspecified: Secondary | ICD-10-CM | POA: Diagnosis not present

## 2021-11-10 MED ORDER — PIRFENIDONE 801 MG PO TABS
1.0000 | ORAL_TABLET | Freq: Three times a day (TID) | ORAL | 1 refills | Status: DC
  Filled 2021-11-10 – 2021-11-24 (×2): qty 90, 30d supply, fill #0
  Filled 2021-12-23: qty 90, 30d supply, fill #1

## 2021-11-10 NOTE — Progress Notes (Signed)
Daily Session Note  Patient Details  Name: Richard Parrish MRN: [id number] Date of Birth: 10-24-1950 Referring Provider:   Flowsheet Row Pulmonary Rehab from 07/14/2021 in [facility] Cardiac and Pulmonary Rehab  Referring Provider Marshell Garfinkel MD       Encounter Date: 11/10/2021  Check In:  Session Check In - 11/10/21 1613       Check-In   Supervising physician immediately available to respond to emergencies See telemetry face sheet for immediately available ER MD    Location [facility]-Cardiac & Pulmonary Rehab    Staff Present Renita Papa, RN BSN;Joseph Tessie Fass, RCP,RRT,BSRT;Noah Stuart, [place], Exercise Physiologist    Virtual Visit No    Medication changes reported     No    Fall or balance concerns reported    No    Warm-up and Cool-down Performed on first and last piece of equipment    Resistance Training Performed Yes    VAD Patient? No    PAD/SET Patient? No      Pain Assessment   Currently in Pain? No/denies                Social History   Tobacco Use  Smoking Status Former   Packs/day: 2.00   Years: 15.00   Total pack years: 30.00   Types: Cigarettes   Quit date: 03/01/1987   Years since quitting: 34.7  Smokeless Tobacco Never  Tobacco Comments   03/02/21-Pt instructed to not vape or drink alcohol for 24 hours prior to surgery.     Goals Met:  Independence with exercise equipment Exercise tolerated well No report of concerns or symptoms today Strength training completed today  Goals Unmet:  Not Applicable  Comments: Pt able to follow exercise prescription today without complaint.  Will continue to monitor for progression.    Dr. Emily Filbert is Medical Director for [place].  Dr. Ottie Glazier is Medical Director for [facility] Pulmonary Rehabilitation.

## 2021-11-10 NOTE — Telephone Encounter (Signed)
Rx for pirfenidone 801mg  tablets sent to [facility] for further fills. Nothing further needed.  Knox Saliva, PharmD, MPH, BCPS, CPP Clinical Pharmacist (Rheumatology and Pulmonology)

## 2021-11-10 NOTE — Telephone Encounter (Addendum)
Received notification from  [facility]  regarding a prior authorization for PIRFENIDONE. Authorization has been APPROVED from 11/09/2021 to 11/10/2022. Approval letter sent to scan center.  Per test claim, copay for 30 days supply is $416.93  Patient can continue to fill through [place]: [phone]   Authorization # [id number]

## 2021-11-11 ENCOUNTER — Encounter: Payer: PPO | Admitting: *Deleted

## 2021-11-11 DIAGNOSIS — J849 Interstitial pulmonary disease, unspecified: Secondary | ICD-10-CM

## 2021-11-11 NOTE — Progress Notes (Signed)
Daily Session Note  Patient Details  Name: Richard Parrish MRN: [id number] Date of Birth: 02-02-1951 Referring Provider:   April Manson Pulmonary Rehab from 07/14/2021 in [facility] Cardiac and Pulmonary Rehab  Referring Provider Marshell Garfinkel MD       Encounter Date: 11/11/2021  Check In:  Session Check In - 11/11/21 1619       Check-In   Supervising physician immediately available to respond to emergencies See telemetry face sheet for immediately available ER MD    Location [facility]-Cardiac & Pulmonary Rehab    Staff Present Renita Papa, RN BSN;Joseph [place], [place];Heath Lark, RN, BSN, CCRP    Virtual Visit No    Medication changes reported     No    Fall or balance concerns reported    No    Warm-up and Cool-down Performed on first and last piece of equipment    Resistance Training Performed Yes    VAD Patient? No    PAD/SET Patient? No      Pain Assessment   Currently in Pain? No/denies                Social History   Tobacco Use  Smoking Status Former   Packs/day: 2.00   Years: 15.00   Total pack years: 30.00   Types: Cigarettes   Quit date: 03/01/1987   Years since quitting: 34.7  Smokeless Tobacco Never  Tobacco Comments   03/02/21-Pt instructed to not vape or drink alcohol for 24 hours prior to surgery.     Goals Met:  Independence with exercise equipment Exercise tolerated well No report of concerns or symptoms today Strength training completed today  Goals Unmet:  Not Applicable  Comments: Pt able to follow exercise prescription today without complaint.  Will continue to monitor for progression.    Dr. Emily Filbert is Medical Director for [place].  Dr. Ottie Glazier is Medical Director for [facility] Pulmonary Rehabilitation.

## 2021-11-15 ENCOUNTER — Encounter: Payer: PPO | Admitting: *Deleted

## 2021-11-15 DIAGNOSIS — J849 Interstitial pulmonary disease, unspecified: Secondary | ICD-10-CM | POA: Diagnosis not present

## 2021-11-15 NOTE — Progress Notes (Signed)
Daily Session Note  Patient Details  Name: Richard Parrish MRN: [id number] Date of Birth: 1950-09-06 Referring Provider:   Flowsheet Row Pulmonary Rehab from 07/14/2021 in [facility] Cardiac and Pulmonary Rehab  Referring Provider Marshell Garfinkel MD       Encounter Date: 11/15/2021  Check In:  Session Check In - 11/15/21 1546       Check-In   Supervising physician immediately available to respond to emergencies See telemetry face sheet for immediately available ER MD    Location [facility]-Cardiac & Pulmonary Rehab    Staff Present Renita Papa, RN Moises Blood, BS, ACSM CEP, Exercise Physiologist;Joseph Tessie [place], [place]    Virtual Visit No    Medication changes reported     No    Fall or balance concerns reported    No    Warm-up and Cool-down Performed on first and last piece of equipment    Resistance Training Performed Yes    VAD Patient? No    PAD/SET Patient? No      Pain Assessment   Currently in Pain? No/denies                Social History   Tobacco Use  Smoking Status Former   Packs/day: 2.00   Years: 15.00   Total pack years: 30.00   Types: Cigarettes   Quit date: 03/01/1987   Years since quitting: 34.7  Smokeless Tobacco Never  Tobacco Comments   03/02/21-Pt instructed to not vape or drink alcohol for 24 hours prior to surgery.     Goals Met:  Independence with exercise equipment Exercise tolerated well No report of concerns or symptoms today Strength training completed today  Goals Unmet:  Not Applicable  Comments: Pt able to follow exercise prescription today without complaint.  Will continue to monitor for progression.    Dr. Emily Filbert is Medical Director for [place].  Dr. Ottie Glazier is Medical Director for [facility] Pulmonary Rehabilitation.

## 2021-11-17 ENCOUNTER — Encounter: Payer: PPO | Admitting: *Deleted

## 2021-11-17 DIAGNOSIS — J849 Interstitial pulmonary disease, unspecified: Secondary | ICD-10-CM | POA: Diagnosis not present

## 2021-11-17 NOTE — Progress Notes (Signed)
Daily Session Note  Patient Details  Name: Richard Parrish MRN: [id number] Date of Birth: Jul 01, 1950 Referring Provider:   Flowsheet Row Pulmonary Rehab from 07/14/2021 in [facility] Cardiac and Pulmonary Rehab  Referring Provider Marshell Garfinkel MD       Encounter Date: 11/17/2021  Check In:  Session Check In - 11/17/21 1616       Check-In   Supervising physician immediately available to respond to emergencies See telemetry face sheet for immediately available ER MD    Location [facility]-Cardiac & Pulmonary Rehab    Staff Present Renita Papa, RN BSN;Laureen Owens Shark, BS, RRT, CPFT;Joseph [place], [place]    Virtual Visit No    Medication changes reported     No    Fall or balance concerns reported    No    Warm-up and Cool-down Performed on first and last piece of equipment    Resistance Training Performed Yes    VAD Patient? No    PAD/SET Patient? No      Pain Assessment   Currently in Pain? No/denies                Social History   Tobacco Use  Smoking Status Former   Packs/day: 2.00   Years: 15.00   Total pack years: 30.00   Types: Cigarettes   Quit date: 03/01/1987   Years since quitting: 34.7  Smokeless Tobacco Never  Tobacco Comments   03/02/21-Pt instructed to not vape or drink alcohol for 24 hours prior to surgery.     Goals Met:  Independence with exercise equipment Exercise tolerated well No report of concerns or symptoms today Strength training completed today  Goals Unmet:  Not Applicable  Comments: Pt able to follow exercise prescription today without complaint.  Will continue to monitor for progression.    Dr. Emily Filbert is Medical Director for [place].  Dr. Ottie Glazier is Medical Director for [facility] Pulmonary Rehabilitation.

## 2021-11-18 ENCOUNTER — Encounter: Payer: PPO | Admitting: *Deleted

## 2021-11-18 DIAGNOSIS — J849 Interstitial pulmonary disease, unspecified: Secondary | ICD-10-CM | POA: Diagnosis not present

## 2021-11-18 NOTE — Progress Notes (Signed)
Daily Session Note  Patient Details  Name: KELCEY WICKSTROM MRN: [id number] Date of Birth: Dec 17, 1950 Referring Provider:   Flowsheet Row Pulmonary Rehab from 07/14/2021 in [facility] Cardiac and Pulmonary Rehab  Referring Provider Marshell Garfinkel MD       Encounter Date: 11/18/2021  Check In:  Session Check In - 11/18/21 1549       Check-In   Staff Present Renita Papa, RN BSN;Jessica Luan [place]ing, MA, RCEP, CCRP, CCET;Joseph [place], [place]    Virtual Visit No    Medication changes reported     No    Fall or balance concerns reported    No    Tobacco Cessation No Change    Warm-up and Cool-down Performed on first and last piece of equipment    Resistance Training Performed Yes    VAD Patient? No    PAD/SET Patient? No      Pain Assessment   Currently in Pain? No/denies                Social History   Tobacco Use  Smoking Status Former   Packs/day: 2.00   Years: 15.00   Total pack years: 30.00   Types: Cigarettes   Quit date: 03/01/1987   Years since quitting: 34.7  Smokeless Tobacco Never  Tobacco Comments   03/02/21-Pt instructed to not vape or drink alcohol for 24 hours prior to surgery.     Goals Met:  Independence with exercise equipment Exercise tolerated well No report of concerns or symptoms today Strength training completed today  Goals Unmet:  Not Applicable  Comments: Pt able to follow exercise prescription today without complaint.  Will continue to monitor for progression.    Dr. Emily Filbert is Medical Director for [place].  Dr. Ottie Glazier is Medical Director for [facility] Pulmonary Rehabilitation.

## 2021-11-22 ENCOUNTER — Other Ambulatory Visit (HOSPITAL_COMMUNITY): Payer: Self-pay

## 2021-11-22 ENCOUNTER — Encounter: Payer: PPO | Admitting: *Deleted

## 2021-11-22 VITALS — Ht 72.5 in | Wt 222.4 lb

## 2021-11-22 DIAGNOSIS — J849 Interstitial pulmonary disease, unspecified: Secondary | ICD-10-CM

## 2021-11-22 NOTE — Patient Instructions (Signed)
Discharge Patient Instructions  Patient Details  Name: Richard Parrish MRN: [id number] Date of Birth: 12/14/1950 Referring Provider:  Seward Carol, MD   Number of Visits: 58  Reason for Discharge:  Patient reached a stable level of exercise. Patient independent in their exercise. Patient has met program and personal goals.  Smoking History:  Social History   Tobacco Use  Smoking Status Former   Packs/day: 2.00   Years: 15.00   Total pack years: 30.00   Types: Cigarettes   Quit date: 03/01/1987   Years since quitting: 34.7  Smokeless Tobacco Never  Tobacco Comments   03/02/21-Pt instructed to not vape or drink alcohol for 24 hours prior to surgery.     Diagnosis:  ILD (interstitial lung disease) ([place])  Initial Exercise Prescription:  Initial Exercise Prescription - 07/14/21 1300       Date of Initial Exercise RX and Referring Provider   Date 07/14/21    Referring Provider Marshell Garfinkel MD      Oxygen   Oxygen Continuous    Liters 4    Maintain Oxygen Saturation 88% or higher      NuStep   Level 1    SPM 80    Minutes 15    METs 1.9      T5 Nustep   Level 1    SPM 80    Minutes 15    METs 1.9      Biostep-RELP   Level 1    SPM 50    Minutes 15    METs 1.9      Track   Laps 15    Minutes 15    METs 1.82      Prescription Details   Frequency (times per week) 3    Duration Progress to 30 minutes of continuous aerobic without signs/symptoms of physical distress      Intensity   THRR 40-80% of Max Heartrate 106 - 134    Ratings of Perceived Exertion 11-13    Perceived Dyspnea 0-4      Progression   Progression Continue to progress workloads to maintain intensity without signs/symptoms of physical distress.      Resistance Training   Training Prescription Yes    Weight 3 lb    Reps 10-15             Discharge Exercise Prescription (Final Exercise Prescription Changes):  Exercise Prescription Changes - 11/18/21 1600        Response to Exercise   Blood Pressure (Admit) 124/64    Blood Pressure (Exit) 120/60    Heart Rate (Admit) 75 bpm    Heart Rate (Exercise) 146 bpm    Heart Rate (Exit) 78 bpm    Oxygen Saturation (Admit) 98 %    Oxygen Saturation (Exercise) 90 %    Oxygen Saturation (Exit) 95 %    Rating of Perceived Exertion (Exercise) 13    Perceived Dyspnea (Exercise) 3    Symptoms SOB    Duration Continue with 30 min of aerobic exercise without signs/symptoms of physical distress.    Intensity THRR unchanged      Progression   Progression Continue to progress workloads to maintain intensity without signs/symptoms of physical distress.    Average METs 2.36      Resistance Training   Training Prescription Yes    Weight 6 lb    Reps 10-15      Interval Training   Interval Training No      Oxygen  Oxygen Continuous    Liters 4      NuStep   Level 6    Minutes 15      REL-XR   Level 6    Minutes 15    METs 3.5      T5 Nustep   Level 6    Minutes 15    METs 2.2      Track   Laps 21    Minutes 15    METs 2.14      Home Exercise Plan   Plans to continue exercise at Home (comment)   walking, staff videos   Frequency Add 2 additional days to program exercise sessions.    Initial Home Exercises Provided 09/09/21      Oxygen   Maintain Oxygen Saturation 88% or higher             Functional Capacity:  6 Minute Walk     Row Name 07/14/21 1301 11/22/21 1602       6 Minute Walk   Phase Initial Discharge    Distance 815 feet 1070 feet    Distance % Change -- 31.2 %    Distance Feet Change -- 255 ft    Walk Time 6 minutes 6 minutes    # of Rest Breaks 1  Stopped at 5:51 0    MPH 1.54 2.02    METS 1.92 2.33    RPE 11 12    Perceived Dyspnea  2 1    VO2 Peak 6.74 8.15    Symptoms Yes (comment) Yes (comment)    Comments SOB, back pain 5/10, hip pain 4/10 SOB    Resting HR 79 bpm 78 bpm    Resting BP 110/68 122/68    Resting Oxygen Saturation  93 % 97 %     Exercise Oxygen Saturation  during 6 min walk 85 % 88 %    Max Ex. HR 97 bpm 100 bpm    Max Ex. BP 126/72 132/70    2 Minute Post BP 112/68 --      Interval HR   1 Minute HR 79 91    2 Minute HR 92 98    3 Minute HR 97 96    4 Minute HR 97 100    5 Minute HR 97 97    6 Minute HR 96 97    2 Minute Post HR 75 85    Interval Heart Rate? Yes Yes      Interval Oxygen   Interval Oxygen? Yes Yes    Baseline Oxygen Saturation % 93 % 97 %    1 Minute Oxygen Saturation % 92 % 94 %    1 Minute Liters of Oxygen 5 L 4 L    2 Minute Oxygen Saturation % 90 % 91 %    2 Minute Liters of Oxygen 5 L 4 L    3 Minute Oxygen Saturation % 86 % 90 %    3 Minute Liters of Oxygen 5 L 4 L    4 Minute Oxygen Saturation % 85 % 88 %    4 Minute Liters of Oxygen 5 L 4 L    5 Minute Oxygen Saturation % 86 % 89 %    5 Minute Liters of Oxygen 5 L 4 L    6 Minute Oxygen Saturation % 87 % 89 %    6 Minute Liters of Oxygen 5 L 4 L    2 Minute Post Oxygen Saturation % 96 %  95 %    2 Minute Post Liters of Oxygen 5 L 4 L               Nutrition & Weight - Outcomes:  Pre Biometrics - 07/14/21 1300       Pre Biometrics   Height 6' 0.5" (1.842 m)    Weight 209 lb 12.8 oz (95.2 kg)    BMI (Calculated) 28.05    Single Leg Stand 9 seconds             Post Biometrics - 11/22/21 1604        Post  Biometrics   Height 6' 0.5" (1.842 m)    Weight 222 lb 6.4 oz (100.9 kg)    BMI (Calculated) 29.73             Nutrition:  Nutrition Therapy & Goals - 09/29/21 1544       Nutrition Therapy   Diet Heart healthy, low Na, pulmonary MNT    Drug/Food Interactions Statins/Certain Fruits    Protein (specify units) 120g    Fiber 30 grams    Whole Grain Foods 3 servings    Saturated Fats 16 max. grams    Fruits and Vegetables 8 servings/day    Sodium 2 grams      Personal Nutrition Goals   Nutrition Goal ST: practice MyPlate guidelines  LT: meet calorie and protein needs while eating a heart  healthy diet    Comments 71 y.o. M admitted to pulmonary rehab for ILD. PMHx includes CAD, pulmonary embolism, HTN, CAD, malignant neoplasm of R lung, pulmonary fibrosis. Relevant medications includes xanax, lipitor, vit D3, norco, protonic, paxil, prednisone, probiotic. PYP Score: 40. Vegetables & Fruits 5/12. Breads, Grains & Cereals 5/12. Red & Processed Meat 4/12. Poultry 0/2. Fish & Shellfish 2/4. Beans, Nuts & Seeds 3/4. Milk & Dairy Foods 2/6. Toppings, Oils, Seasonings & Salt 8/20. Sweets, Snacks & Restaurant Food 4/14. Beverages 7/10. He feels fatigued a lot of the time; he feels this is due to his shortness of breath. He feels that his appetite is lower than his baseline; has been that way for a couple of months and feels it is because of his [place]. B: Boost with cliff bar or sometimes a couple eggs with sausage and toast. S: fruit, Belvita, or cliff bar (grazing) D: TV dinners 1-2x/week or protein (red meat: grilled chuck and grilled steaks, chicken and fish less often) and vegetables (squash, cucumber, potatoes, broccoli) S: belvita. He reports that he is the one to normally cook dinner - he uses vegetable oil, he uses salt, but has cut back. Drinks: iced tea (slightly sweet tea -pure leaf), water with flavoring, diet soda (caffiene free). Bill sat in on nutrition education last week and reports he has no questions at this time; reviewed heart healthy eating and MyPlate structure. Discussed pulmonary MNT and the importance of meeting energy and protein needs as well as vitamin D and calcium while on steroids. He feels that his biggest barrier is cooking the food he has; he reports buying all the right food, but not cooking a lot of it due to fatigue. Discussed meal planning, ingredient prepping, and frozen vegetables. He is open to making changes.      Intervention Plan   Intervention Prescribe, educate and counsel regarding individualized specific dietary modifications aiming towards targeted core  components such as weight, hypertension, lipid management, diabetes, heart failure and other comorbidities.    Expected Outcomes Short Term Goal: Understand basic principles  of dietary content, such as calories, fat, sodium, cholesterol and nutrients.;Short Term Goal: A plan has been developed with personal nutrition goals set during dietitian appointment.;Long Term Goal: Adherence to prescribed nutrition plan.              Goals reviewed with patient; copy given to patient.

## 2021-11-22 NOTE — Progress Notes (Signed)
Daily Session Note  Patient Details  Name: Richard Parrish MRN: [id number] Date of Birth: 10/19/1950 Referring Provider:   Flowsheet Row Pulmonary Rehab from 07/14/2021 in [facility] Cardiac and Pulmonary Rehab  Referring Provider Marshell Garfinkel MD       Encounter Date: 11/22/2021  Check In:  Session Check In - 11/22/21 [name]       Check-In   Supervising physician immediately available to respond to emergencies See telemetry face sheet for immediately available ER MD    Location [facility]-Cardiac & Pulmonary Rehab    Staff Present Renita Papa, RN BSN;Richard Parrish, RCP,RRT,BSRT;Kara [place], [place], ASCM CEP, Exercise Physiologist    Virtual Visit No    Medication changes reported     No    Fall or balance concerns reported    No    Warm-up and Cool-down Performed on first and last piece of equipment    Resistance Training Performed Yes    VAD Patient? No    PAD/SET Patient? No      Pain Assessment   Currently in Pain? No/denies                Social History   Tobacco Use  Smoking Status Former   Packs/day: 2.00   Years: 15.00   Total pack years: 30.00   Types: Cigarettes   Quit date: 03/01/1987   Years since quitting: 34.7  Smokeless Tobacco Never  Tobacco Comments   03/02/21-Pt instructed to not vape or drink alcohol for 24 hours prior to surgery.     Goals Met:  Independence with exercise equipment Exercise tolerated well No report of concerns or symptoms today Strength training completed today  Goals Unmet:  Not Applicable  Comments: Pt able to follow exercise prescription today without complaint.  Will continue to monitor for progression.    Dr. Emily Filbert is Medical Director for [place].  Dr. Ottie Glazier is Medical Director for [facility] Pulmonary Rehabilitation.

## 2021-11-24 ENCOUNTER — Other Ambulatory Visit (HOSPITAL_COMMUNITY): Payer: Self-pay

## 2021-11-24 ENCOUNTER — Encounter: Payer: PPO | Admitting: *Deleted

## 2021-11-24 DIAGNOSIS — J849 Interstitial pulmonary disease, unspecified: Secondary | ICD-10-CM | POA: Diagnosis not present

## 2021-11-24 NOTE — Progress Notes (Signed)
Daily Session Note  Patient Details  Name: Richard Parrish MRN: [id number] Date of Birth: 1951-01-05 Referring Provider:   Flowsheet Row Pulmonary Rehab from 07/14/2021 in [facility] Cardiac and Pulmonary Rehab  Referring Provider Marshell Garfinkel MD       Encounter Date: 11/24/2021  Check In:  Session Check In - 11/24/21 1643       Check-In   Supervising physician immediately available to respond to emergencies See telemetry face sheet for immediately available ER MD    Location [facility]-Cardiac & Pulmonary Rehab    Staff Present Justin Mend, Sharren [place], MS, ASCM CEP, Exercise Physiologist;Megan Tamala Julian, RN, ADN    Virtual Visit No    Medication changes reported     No    Fall or balance concerns reported    No    Warm-up and Cool-down Performed on first and last piece of equipment    Resistance Training Performed Yes    VAD Patient? No    PAD/SET Patient? No      Pain Assessment   Currently in Pain? No/denies                Social History   Tobacco Use  Smoking Status Former   Packs/day: 2.00   Years: 15.00   Total pack years: 30.00   Types: Cigarettes   Quit date: 03/01/1987   Years since quitting: 34.7  Smokeless Tobacco Never  Tobacco Comments   03/02/21-Pt instructed to not vape or drink alcohol for 24 hours prior to surgery.     Goals Met:  Independence with exercise equipment Exercise tolerated well No report of concerns or symptoms today Strength training completed today  Goals Unmet:  Not Applicable  Comments:  Richard Parrish graduated today from  rehab with 36 sessions completed.  Details of the patient's exercise prescription and what He needs to do in order to continue the prescription and progress were discussed with patient.  Patient was given a copy of prescription and goals.  Patient verbalized understanding.  Richard Parrish plans to continue to exercise by walking.    Dr. Emily Filbert is Medical Director for [place].  Dr.  Ottie Glazier is Medical Director for [facility] Pulmonary Rehabilitation.

## 2021-11-24 NOTE — Progress Notes (Signed)
Pulmonary Individual Treatment Plan  Patient Details  Name: Richard Parrish MRN: [id number] Date of Birth: [birth year] Referring Provider:   Flowsheet Row Pulmonary Rehab from 07/14/2021 in [facility] Cardiac and Pulmonary Rehab  Referring Provider Marshell Garfinkel MD       Initial Encounter Date:  Flowsheet Row Pulmonary Rehab from 07/14/2021 in [facility] Cardiac and Pulmonary Rehab  Date 07/14/21       Visit Diagnosis: ILD (interstitial lung disease) ([place])  Patient's Home Medications on Admission:  Current Outpatient Medications:    acetaminophen (TYLENOL) 500 MG tablet, Take 1 tablet (500 mg total) by mouth every 6 (six) hours as needed for mild pain., Disp: , Rfl:    ALPRAZolam (XANAX) 0.5 MG tablet, Take 0.5-1 tablets (0.25-0.5 mg total) by mouth 3 (three) times daily as needed for anxiety or sleep., Disp: 5 tablet, Rfl: 0   antiseptic oral rinse (BIOTENE) LIQD, 15 mLs by Mouth Rinse route daily as needed for dry mouth., Disp: , Rfl:    atorvastatin (LIPITOR) 80 MG tablet, TAKE 1 TABLET BY MOUTH EVERY DAY, Disp: 90 tablet, Rfl: 0   cholecalciferol (VITAMIN D3) 25 MCG (1000 UNIT) tablet, Take 1,000 Units by mouth in the morning., Disp: , Rfl:    clopidogrel (PLAVIX) 75 MG tablet, TAKE 1 TABLET BY MOUTH EVERY DAY (Patient not taking: Reported on 10/26/2021), Disp: 30 tablet, Rfl: 11   diphenhydrAMINE (BENADRYL) 25 mg capsule, Take 25 mg by mouth at bedtime as needed for sleep (for sleep)., Disp: , Rfl:    ELIQUIS 5 MG TABS tablet, TAKE 1 TABLET BY MOUTH TWICE A DAY, Disp: 180 tablet, Rfl: 1   gabapentin (NEURONTIN) 100 MG capsule, Take 2 capsules (200 mg total) by mouth 3 (three) times daily., Disp: 90 capsule, Rfl: 1   guaiFENesin (MUCINEX) 600 MG 12 hr tablet, Take 1 tablet (600 mg total) by mouth 2 (two) times daily as needed. (Patient taking differently: Take 600 mg by mouth 2 (two) times daily as needed for cough or to loosen phlegm.), Disp: , Rfl:    HYDROcodone-acetaminophen (NORCO)  7.5-325 MG tablet, Take 1 tablet by mouth every 4 (four) hours as needed., Disp: , Rfl:    hyoscyamine (LEVSIN, ANASPAZ) 0.125 MG tablet, Take 0.125 mg by mouth every 4 (four) hours as needed for cramping. , Disp: , Rfl:    metoprolol succinate (TOPROL-XL) 50 MG 24 hr tablet, Take 0.5 tablets (25 mg total) by mouth daily. Take with or immediately following a meal., Disp: , Rfl:    nitroGLYCERIN (NITROSTAT) 0.4 MG SL tablet, Place 0.4 mg under the tongue every 5 (five) minutes as needed for chest pain., Disp: , Rfl:    pantoprazole (PROTONIX) 40 MG tablet, TAKE 1 TABLET BY MOUTH EVERY DAY BEFORE BREAKFAST, Disp: 90 tablet, Rfl: 1   PARoxetine (PAXIL) 40 MG tablet, Take 40 mg by mouth in the morning., Disp: , Rfl: 6   Pirfenidone 801 MG TABS, Take 1 tablet by mouth 3 (three) times daily with meals., Disp: 90 tablet, Rfl: 1   predniSONE (DELTASONE) 10 MG tablet, Take 45m daily for 1 month then reduce 113mmonthly, Disp: 120 tablet, Rfl: 2   Probiotic Product (PROBIOTIC PO), Take 1 capsule by mouth daily., Disp: , Rfl:    sodium chloride (OCEAN) 0.65 % SOLN nasal spray, Place 1 spray into both nostrils daily as needed for congestion., Disp: , Rfl:    sulfamethoxazole-trimethoprim (BACTRIM DS) 800-160 MG tablet, Take 1 tablet by mouth 3 (three) times a week.,  Disp: 12 tablet, Rfl: 5   tamsulosin (FLOMAX) 0.4 MG CAPS capsule, Take 0.4 mg by mouth 2 (two) times daily., Disp: , Rfl:   Past Medical History: Past Medical History:  Diagnosis Date   Anxiety    Arthritis    "lower back" (04/07/2017)   BPH (benign prostatic hypertrophy)    C. difficile diarrhea    Chronic lower back pain    Coronary artery disease 1989   cabg with dvg to rca    Depression    GERD (gastroesophageal reflux disease)    Hyperlipidemia    Hypertension    Insomnia    Myocardial infarction ([place]) 2002   OSA on CPAP    cpap setting of 60 per pt   Osteoarthritis    "hips" (04/07/2017)   PAD (peripheral artery disease) ([place])     Peripheral vascular disease ([place])    Pneumonia 1989   S/P CABG   Pulmonary embolism ([place]) 05/18/2021   Pulmonary fibrosis ([place])    "one time" (04/07/2017)   Spinal stenosis     Tobacco Use: Social History   Tobacco Use  Smoking Status Former   Packs/day: 2.00   Years: 15.00   Total pack years: 30.00   Types: Cigarettes   Quit date: 03/01/1987   Years since quitting: 34.7  Smokeless Tobacco Never  Tobacco Comments   03/02/21-Pt instructed to not vape or drink alcohol for 24 hours prior to surgery.     Labs: Review Flowsheet  More data exists      Latest Ref Rng & Units 10/04/2020 10/05/2020 03/02/2021 04/13/2021 05/18/2021  Labs for ITP Cardiac and Pulmonary Rehab  Cholestrol 0 - 200 mg/dL 97  - - - -  LDL (calc) 0 - 99 mg/dL 53  - - - -  HDL-C >40 mg/dL 35  - - - -  Trlycerides <150 mg/dL 46  - - - -  PH, Arterial 7.350 - 7.450 - 7.355  7.402  - -  PCO2 arterial 32.0 - 48.0 mmHg - 41.9  38.9  - -  Bicarbonate 20.0 - 28.0 mmol/L - 23.4  23.7  23.7  - -  TCO2 22 - 32 mmol/L - 25  25  - 29  25   Acid-base deficit 0.0 - 2.0 mmol/L - 2.0  2.0  0.5  - -  O2 Saturation % - 99.0  70.0  97.6  - -     Pulmonary Assessment Scores:  Pulmonary Assessment Scores     Row Name 07/14/21 1228 11/22/21 1553       ADL UCSD   ADL Phase Entry Exit    SOB Score total 55 55    Rest 0 0    Walk 3 2    Stairs 4 4    Bath 3 3    Dress 2 2    Shop 2 3      CAT Score   CAT Score 21 21      mMRC Score   mMRC Score 3 --             UCSD: Self-administered rating of dyspnea associated with activities of daily living (ADLs) 6-point scale (0 = "not at all" to 5 = "maximal or unable to do because of breathlessness")  Scoring Scores range from 0 to 120.  Minimally important difference is 5 units  CAT: CAT can identify the health impairment of COPD patients and is better correlated with disease progression.  CAT has a scoring range of  zero to 40. The CAT score is classified into four  groups of low (less than 10), medium (10 - 20), high (21-30) and very high (31-40) based on the impact level of disease on health status. A CAT score over 10 suggests significant symptoms.  A worsening CAT score could be explained by an exacerbation, poor medication adherence, poor inhaler technique, or progression of COPD or comorbid conditions.  CAT MCID is 2 points  mMRC: mMRC (Modified Medical Research Council) Dyspnea Scale is used to assess the degree of baseline functional disability in patients of respiratory disease due to dyspnea. No minimal important difference is established. A decrease in score of 1 point or greater is considered a positive change.   Pulmonary Function Assessment:   Exercise Target Goals: Exercise Program Goal: Individual exercise prescription set using results from initial 6 min walk test and THRR while considering  patient's activity barriers and safety.   Exercise Prescription Goal: Initial exercise prescription builds to 30-45 minutes a day of aerobic activity, 2-3 days per week.  Home exercise guidelines will be given to patient during program as part of exercise prescription that the participant will acknowledge.  Education: Aerobic Exercise: - Group verbal and visual presentation on the components of exercise prescription. Introduces F.I.T.T principle from ACSM for exercise prescriptions.  Reviews F.I.T.T. principles of aerobic exercise including progression. Written material given at graduation.   Education: Resistance Exercise: - Group verbal and visual presentation on the components of exercise prescription. Introduces F.I.T.T principle from ACSM for exercise prescriptions  Reviews F.I.T.T. principles of resistance exercise including progression. Written material given at graduation.    Education: Exercise & Equipment Safety: - Individual verbal instruction and demonstration of equipment use and safety with use of the equipment. Flowsheet Row  Pulmonary Rehab from 11/17/2021 in [facility] Cardiac and Pulmonary Rehab  Education need identified 07/14/21  Date 07/14/21  Educator [place]  Instruction Review Code 1- Verbalizes Understanding       Education: Exercise Physiology & General Exercise Guidelines: - Group verbal and written instruction with models to review the exercise physiology of the cardiovascular system and associated critical values. Provides general exercise guidelines with specific guidelines to those with heart or lung disease.  Flowsheet Row Pulmonary Rehab from 11/17/2021 in [facility] Cardiac and Pulmonary Rehab  Education need identified 07/14/21  Date 08/25/21  Educator [place]  Instruction Review Code 1- [employer] Understanding       Education: Flexibility, Balance, Mind/Body Relaxation: - Group verbal and visual presentation with interactive activity on the components of exercise prescription. Introduces F.I.T.T principle from ACSM for exercise prescriptions. Reviews F.I.T.T. principles of flexibility and balance exercise training including progression. Also discusses the mind body connection.  Reviews various relaxation techniques to help reduce and manage stress (i.e. Deep breathing, progressive muscle relaxation, and visualization). Balance handout provided to take home. Written material given at graduation.   Activity Barriers & Risk Stratification:  Activity Barriers & Cardiac Risk Stratification - 07/14/21 1301       Activity Barriers & Cardiac Risk Stratification   Activity Barriers Shortness of Breath;Deconditioning;Back Problems;History of Falls;Joint Problems;Muscular Weakness             6 Minute Walk:  6 Minute Walk     Row Name 07/14/21 1301 11/22/21 1602       6 Minute Walk   Phase Initial Discharge    Distance 815 feet 1070 feet    Distance % Change -- 31.2 %    Distance Feet Change -- 255  ft    Walk Time 6 minutes 6 minutes    # of Rest Breaks 1  Stopped at 5:51 0    MPH 1.54 2.02    METS  1.92 2.33    RPE 11 12    Perceived Dyspnea  2 1    VO2 Peak 6.74 8.15    Symptoms Yes (comment) Yes (comment)    Comments SOB, back pain 5/10, hip pain 4/10 SOB    Resting HR 79 bpm 78 bpm    Resting BP 110/68 122/68    Resting Oxygen Saturation  93 % 97 %    Exercise Oxygen Saturation  during 6 min walk 85 % 88 %    Max Ex. HR 97 bpm 100 bpm    Max Ex. BP 126/72 132/70    2 Minute Post BP 112/68 --      Interval HR   1 Minute HR 79 91    2 Minute HR 92 98    3 Minute HR 97 96    4 Minute HR 97 100    5 Minute HR 97 97    6 Minute HR 96 97    2 Minute Post HR 75 85    Interval Heart Rate? Yes Yes      Interval Oxygen   Interval Oxygen? Yes Yes    Baseline Oxygen Saturation % 93 % 97 %    1 Minute Oxygen Saturation % 92 % 94 %    1 Minute Liters of Oxygen 5 L 4 L    2 Minute Oxygen Saturation % 90 % 91 %    2 Minute Liters of Oxygen 5 L 4 L    3 Minute Oxygen Saturation % 86 % 90 %    3 Minute Liters of Oxygen 5 L 4 L    4 Minute Oxygen Saturation % 85 % 88 %    4 Minute Liters of Oxygen 5 L 4 L    5 Minute Oxygen Saturation % 86 % 89 %    5 Minute Liters of Oxygen 5 L 4 L    6 Minute Oxygen Saturation % 87 % 89 %    6 Minute Liters of Oxygen 5 L 4 L    2 Minute Post Oxygen Saturation % 96 % 95 %    2 Minute Post Liters of Oxygen 5 L 4 L            Oxygen Initial Assessment:  Oxygen Initial Assessment - 07/14/21 1228       Home Oxygen   Home Oxygen Device Home Concentrator;E-Tanks    Sleep Oxygen Prescription Continuous    Liters per minute 4    Home Exercise Oxygen Prescription Continuous    Liters per minute 4    Home Resting Oxygen Prescription Continuous    Liters per minute 4    Compliance with Home Oxygen Use Yes      Initial 6 min Walk   Oxygen Used Pulsed    Liters per minute 5      Program Oxygen Prescription   Program Oxygen Prescription Continuous    Liters per minute 4      Intervention   Short Term Goals To learn and exhibit  compliance with exercise, home and travel O2 prescription;To learn and understand importance of maintaining oxygen saturations>88%;To learn and demonstrate proper use of respiratory medications;To learn and understand importance of monitoring SPO2 with pulse oximeter and demonstrate accurate use of the pulse oximeter.;To  learn and demonstrate proper pursed lip breathing techniques or other breathing techniques.     Long  Term Goals Exhibits compliance with exercise, home  and travel O2 prescription;Maintenance of O2 saturations>88%;Compliance with respiratory medication;Verbalizes importance of monitoring SPO2 with pulse oximeter and return demonstration;Exhibits proper breathing techniques, such as pursed lip breathing or other method taught during program session;Demonstrates proper use of MDI's             Oxygen Re-Evaluation:  Oxygen Re-Evaluation     Row Name 07/19/21 1534 07/22/21 1543 08/23/21 1605 09/09/21 1547 11/18/21 1716     Program Oxygen Prescription   Program Oxygen Prescription _0    Liters per minute _1 Home Oxygen   Home Oxygen Device Home Concentrator;E-Tanks Home Concentrator;E-Tanks Home Concentrator;E-Tanks Home Concentrator;E-Tanks Home Concentrator;E-Tanks   Sleep Oxygen Prescription _2    Liters per minute _3 Home Exercise Oxygen Prescription _4    Liters per minute _5 Home Resting Oxygen Prescription _6    Liters per minute _7 Compliance with Home Oxygen Use Yes Yes Yes  He usually wears his O2, but sometimes he will see if his O2 drops at certain times - such as walking down to rehab. Yes Yes     Goals/Expected Outcomes   Short Term Goals To learn and exhibit compliance with exercise, home and travel O2 prescription;To  learn and understand importance of maintaining oxygen saturations>88%;To learn and understand importance of monitoring SPO2 with pulse oximeter and demonstrate accurate use of the pulse oximeter.;To learn and demonstrate proper pursed lip breathing techniques or other breathing techniques.  To learn and understand importance of monitoring SPO2 with pulse oximeter and demonstrate accurate use of the pulse oximeter.;To learn and understand importance of maintaining oxygen saturations>88% To learn and understand importance of monitoring SPO2 with pulse oximeter and demonstrate accurate use of the pulse oximeter.;To learn and understand importance of maintaining oxygen saturations>88% To learn and understand importance of monitoring SPO2 with pulse oximeter and demonstrate accurate use of the pulse oximeter.;To learn and understand importance of maintaining oxygen saturations>88% To learn and understand importance of monitoring SPO2 with pulse oximeter and demonstrate accurate use of the pulse oximeter.;To learn and understand importance of maintaining oxygen saturations>88%   Long  Term Goals Exhibits compliance with exercise, home  and travel O2 prescription;Maintenance of O2 saturations>88%;Verbalizes importance of monitoring SPO2 with pulse oximeter and return demonstration;Exhibits proper breathing techniques, such as pursed lip breathing or other method taught during program session Verbalizes importance of monitoring SPO2 with pulse oximeter and return demonstration;Maintenance of O2 saturations>88% Verbalizes importance of monitoring SPO2 with pulse oximeter and return demonstration;Maintenance of O2 saturations>88% Verbalizes importance of monitoring SPO2 with pulse oximeter and return demonstration;Maintenance of O2 saturations>88% Verbalizes importance of monitoring SPO2 with pulse oximeter and return demonstration;Maintenance of O2 saturations>88%   Comments Reviewed PLB technique with pt.  Talked about  how it works and it's importance in maintaining their exercise saturations. He has a pulse oximeter to check his oxygen saturation at home. Informed and explained why it is important to have one. Reviewed that oxygen saturations should be 88 percent and above. Richard Parrish reports his shortness of breath has been improving since starting rehab. He checks his O2 all the time and keeps a pulse oximeter on him - he makes  sure his O2 does not dip below 88. He practices PLB when he gets short of breath. He also practices diaphragmatic breathing laying down. Richard Parrish reports his shortness of breath has gotten worse about a month ago. He will take breaks as needed when performing ADLs; he has two pulse oximeters which he uses - he knows his O2 should stay above 88%. He continues to practice diaphragmatic breathing. He continues to use PLB when he has shortness of breath and feels this helps. Richard Parrish states his oxygen sats have been much better than ever before. We reminded him to be compliant with his home O2, wearing it when he should. He is checking his O2 at home and he reports most of the time its aove 89%. He practices PLB when needed. He graduates next week   Goals/Expected Outcomes Short: Become more profiecient at using PLB.   Long: Become independent at using PLB. Short: monitor oxygen at home with exertion. Long: maintain oxygen saturations above 88 percent independently. Short: continue to check O2 Long: maintain oxygen saturations above 88 percent independently. Short: continue to check O2 Long: maintain oxygen saturations above 88 percent independently. Short: continue to check O2 and wear it at home Long: maintain oxygen saturations above 88 percent independently.            Oxygen Discharge (Final Oxygen Re-Evaluation):  Oxygen Re-Evaluation - 11/18/21 1716       Program Oxygen Prescription   Program Oxygen Prescription Continuous    Liters per minute 5      Home Oxygen   Home Oxygen Device Home  Concentrator;E-Tanks    Sleep Oxygen Prescription Continuous    Liters per minute 4    Home Exercise Oxygen Prescription Continuous    Liters per minute 5    Home Resting Oxygen Prescription Continuous    Liters per minute 4    Compliance with Home Oxygen Use Yes      Goals/Expected Outcomes   Short Term Goals To learn and understand importance of monitoring SPO2 with pulse oximeter and demonstrate accurate use of the pulse oximeter.;To learn and understand importance of maintaining oxygen saturations>88%    Long  Term Goals Verbalizes importance of monitoring SPO2 with pulse oximeter and return demonstration;Maintenance of O2 saturations>88%    Comments Richard Parrish states his oxygen sats have been much better than ever before. We reminded him to be compliant with his home O2, wearing it when he should. He is checking his O2 at home and he reports most of the time its aove 89%. He practices PLB when needed. He graduates next week    Goals/Expected Outcomes Short: continue to check O2 and wear it at home Long: maintain oxygen saturations above 88 percent independently.             Initial Exercise Prescription:  Initial Exercise Prescription - 07/14/21 1300       Date of Initial Exercise RX and Referring Provider   Date 07/14/21    Referring Provider Marshell Garfinkel MD      Oxygen   Oxygen Continuous    Liters 4    Maintain Oxygen Saturation 88% or higher      NuStep   Level 1    SPM 80    Minutes 15    METs 1.9      T5 Nustep   Level 1    SPM 80    Minutes 15    METs 1.9      Biostep-RELP   Level 1  SPM 50    Minutes 15    METs 1.9      Track   Laps 15    Minutes 15    METs 1.82      Prescription Details   Frequency (times per week) 3    Duration Progress to 30 minutes of continuous aerobic without signs/symptoms of physical distress      Intensity   THRR 40-80% of Max Heartrate 106 - 134    Ratings of Perceived Exertion 11-13    Perceived Dyspnea 0-4       Progression   Progression Continue to progress workloads to maintain intensity without signs/symptoms of physical distress.      Resistance Training   Training Prescription Yes    Weight 3 lb    Reps 10-15             Perform Capillary Blood Glucose checks as needed.  Exercise Prescription Changes:   Exercise Prescription Changes     Row Name 07/14/21 1300 07/29/21 0800 08/13/21 0800 08/23/21 1100 09/06/21 1600     Response to Exercise   Blood Pressure (Admit) 110/68 114/66 134/70 102/68 146/70   Blood Pressure (Exercise) 126/72 134/70 148/78 138/74 --   Blood Pressure (Exit) 112/68 104/60 124/64 102/64 124/68   Heart Rate (Admit) 79 bpm 73 bpm 90 bpm 87 bpm 94 bpm   Heart Rate (Exercise) 97 bpm 103 bpm 94 bpm 100 bpm 107 bpm   Heart Rate (Exit) 75 bpm 85 bpm 89 bpm 90 bpm 98 bpm   Oxygen Saturation (Admit) 93 % 95 % 91 % 95 % 88 %   Oxygen Saturation (Exercise) 85 % 86 % 92 % 93 % 92 %   Oxygen Saturation (Exit) 96 % 97 % 93 % 96 % 98 %   Rating of Perceived Exertion (Exercise) _0 Perceived Dyspnea (Exercise) _1 Symptoms SOB, back pain 5/10, hip pain 4/10 SOB SOB SOB SOB   Comments walk test results -- -- -- --   Duration -- Progress to 30 minutes of  aerobic without signs/symptoms of physical distress Continue with 30 min of aerobic exercise without signs/symptoms of physical distress. Continue with 30 min of aerobic exercise without signs/symptoms of physical distress. Continue with 30 min of aerobic exercise without signs/symptoms of physical distress.   Intensity -- THRR unchanged THRR unchanged THRR unchanged THRR unchanged     Progression   Progression -- Continue to progress workloads to maintain intensity without signs/symptoms of physical distress. Continue to progress workloads to maintain intensity without signs/symptoms of physical distress. Continue to progress workloads to maintain intensity without signs/symptoms of physical distress.  Continue to progress workloads to maintain intensity without signs/symptoms of physical distress.   Average METs -- 2.27 2.35 2.3 2.59     Resistance Training   Training Prescription -- Yes Yes Yes Yes   Weight -- 3 lb 3 lb 3 lb 6 lb   Reps -- 10-15 10-15 10-15 10-15     Interval Training   Interval Training -- -- -- -- No     Oxygen   Oxygen -- Continuous Continuous Continuous Continuous   Liters -- _2 NuStep   Level -- _3 Minutes -- _4 METs -- 2.4 2.4 2.8 2.6     REL-XR   Level -- -- -- -- 6  Minutes -- -- -- -- 15   METs -- -- -- -- 3.1     T5 Nustep   Level -- 3 5 -- --   Minutes -- 15 15 -- --   METs -- 2.4 2.3 -- --     Biostep-RELP   Level -- 2 -- -- --   Minutes -- 15 -- -- --   METs -- 2 -- -- --     Track   Laps -- 15 -- 24 20   Minutes -- _0 METs -- 1.82 -- 2.31 2.09     Oxygen   Maintain Oxygen Saturation -- 88% or higher 88% or higher 88% or higher 88% or higher    Row Name 09/09/21 1600 09/23/21 0800 10/05/21 1500 11/02/21 1600 11/18/21 1600     Response to Exercise   Blood Pressure (Admit) -- 122/64 112/76 126/80 124/64   Blood Pressure (Exit) -- 112/64 118/60 128/74 120/60   Heart Rate (Admit) -- 100 bpm 77 bpm 80 bpm 75 bpm   Heart Rate (Exercise) -- 109 bpm 106 bpm 92 bpm 146 bpm   Heart Rate (Exit) -- 104 bpm 85 bpm 80 bpm 78 bpm   Oxygen Saturation (Admit) -- 98 % 98 % 95 % 98 %   Oxygen Saturation (Exercise) -- 90 % 88 % 94 % 90 %   Oxygen Saturation (Exit) -- 97 % 96 % 98 % 95 %   Rating of Perceived Exertion (Exercise) -- _1 Perceived Dyspnea (Exercise) -- _2 Symptoms -- SOB SOB SOB SOB   Duration -- Continue with 30 min of aerobic exercise without signs/symptoms of physical distress. Continue with 30 min of aerobic exercise without signs/symptoms of physical distress. Continue with 30 min of aerobic exercise without signs/symptoms of physical distress. Continue with 30 min of  aerobic exercise without signs/symptoms of physical distress.   Intensity -- THRR unchanged THRR unchanged THRR unchanged THRR unchanged     Progression   Progression -- Continue to progress workloads to maintain intensity without signs/symptoms of physical distress. Continue to progress workloads to maintain intensity without signs/symptoms of physical distress. Continue to progress workloads to maintain intensity without signs/symptoms of physical distress. Continue to progress workloads to maintain intensity without signs/symptoms of physical distress.   Average METs -- 3 2.14 2.29 2.36     Resistance Training   Training Prescription -- Yes Yes Yes Yes   Weight -- 6 lb 6 lb 6 lb  added bands as well 6 lb   Reps -- 10-15 10-15 10-15 10-15     Interval Training   Interval Training -- No No No No     Oxygen   Oxygen -- Continuous Continuous Continuous Continuous   Liters -- _3 Recumbant Bike   Level -- 6 6 -- --   Watts -- 25 39 -- --   Minutes -- 15 15 -- --   METs -- 2.8 2.78 -- --     NuStep   Level -- 5 5 -- 6   Minutes -- 15 15 -- 15   METs -- 3 2.1 -- --     REL-XR   Level -- -- _4 Minutes -- -- _5 METs -- -- -- 2.5 3.5     T5 Nustep   Level -- 5 -- 5 6  Minutes -- 15 -- 15 15   METs -- 2.4 -- -- 2.2     Track   Laps -- -- _0 Minutes -- -- _1 METs -- -- 1.54 2.09 2.14     Home Exercise Plan   Plans to continue exercise at Home (comment)  walking, staff videos Home (comment)  walking, staff videos Home (comment)  walking, staff videos Home (comment)  walking, staff videos Home (comment)  walking, staff videos   Frequency Add 2 additional days to program exercise sessions. Add 2 additional days to program exercise sessions. Add 2 additional days to program exercise sessions. Add 2 additional days to program exercise sessions. Add 2 additional days to program exercise sessions.   Initial Home Exercises Provided 09/09/21  09/09/21 09/09/21 09/09/21 09/09/21     Oxygen   Maintain Oxygen Saturation -- 88% or higher 88% or higher 88% or higher 88% or higher            Exercise Comments:   Exercise Comments     Row Name 07/19/21 1533           Exercise Comments First full day of exercise!  Patient was oriented to gym and equipment including functions, settings, policies, and procedures.  Patient's individual exercise prescription and treatment plan were reviewed.  All starting workloads were established based on the results of the 6 minute walk test done at initial orientation visit.  The plan for exercise progression was also introduced and progression will be customized based on patient's performance and goals.                Exercise Goals and Review:   Exercise Goals     Row Name 07/14/21 1309             Exercise Goals   Increase Physical Activity Yes       Intervention Provide advice, education, support and counseling about physical activity/exercise needs.;Develop an individualized exercise prescription for aerobic and resistive training based on initial evaluation findings, risk stratification, comorbidities and participant's personal goals.       Expected Outcomes Short Term: Attend rehab on a regular basis to increase amount of physical activity.;Long Term: Add in home exercise to make exercise part of routine and to increase amount of physical activity.;Long Term: Exercising regularly at least 3-5 days a week.       Increase Strength and Stamina Yes       Intervention Provide advice, education, support and counseling about physical activity/exercise needs.;Develop an individualized exercise prescription for aerobic and resistive training based on initial evaluation findings, risk stratification, comorbidities and participant's personal goals.       Expected Outcomes Short Term: Increase workloads from initial exercise prescription for resistance, speed, and METs.;Short Term: Perform  resistance training exercises routinely during rehab and add in resistance training at home;Long Term: Improve cardiorespiratory fitness, muscular endurance and strength as measured by increased METs and functional capacity (6MWT)       Able to understand and use rate of perceived exertion (RPE) scale Yes       Intervention Provide education and explanation on how to use RPE scale       Expected Outcomes Short Term: Able to use RPE daily in rehab to express subjective intensity level;Long Term:  Able to use RPE to guide intensity level when exercising independently       Able to understand and use Dyspnea scale Yes  Intervention Provide education and explanation on how to use Dyspnea scale       Expected Outcomes Short Term: Able to use Dyspnea scale daily in rehab to express subjective sense of shortness of breath during exertion;Long Term: Able to use Dyspnea scale to guide intensity level when exercising independently       Knowledge and understanding of Target Heart Rate Range (THRR) Yes       Intervention Provide education and explanation of THRR including how the numbers were predicted and where they are located for reference       Expected Outcomes Short Term: Able to state/look up THRR;Long Term: Able to use THRR to govern intensity when exercising independently;Short Term: Able to use daily as guideline for intensity in rehab       Able to check pulse independently Yes       Intervention Provide education and demonstration on how to check pulse in carotid and radial arteries.;Review the importance of being able to check your own pulse for safety during independent exercise       Expected Outcomes Short Term: Able to explain why pulse checking is important during independent exercise;Long Term: Able to check pulse independently and accurately       Understanding of Exercise Prescription Yes       Intervention Provide education, explanation, and written materials on patient's individual  exercise prescription       Expected Outcomes Short Term: Able to explain program exercise prescription;Long Term: Able to explain home exercise prescription to exercise independently                Exercise Goals Re-Evaluation :  Exercise Goals Re-Evaluation     Row Name 07/19/21 1533 07/29/21 0819 08/13/21 0819 08/23/21 1143 08/23/21 1608     Exercise Goal Re-Evaluation   Exercise Goals Review Increase Physical Activity;Knowledge and understanding of Target Heart Rate Range (THRR);Understanding of Exercise Prescription;Able to understand and use rate of perceived exertion (RPE) scale;Increase Strength and Stamina;Able to understand and use Dyspnea scale;Able to check pulse independently Increase Physical Activity;Increase Strength and Stamina;Understanding of Exercise Prescription Increase Physical Activity;Increase Strength and Stamina;Understanding of Exercise Prescription Increase Physical Activity;Increase Strength and Stamina;Understanding of Exercise Prescription Increase Physical Activity;Increase Strength and Stamina;Understanding of Exercise Prescription   Comments Reviewed RPE and dyspnea scales, THR and program prescription with pt today.  Pt voiced understanding and was given a copy of goals to take home. Richard Parrish has had a good start in rehab. He has increased his load to level 4 on the T4 machine. He has also tolerated 3 lb hand weights for resistance training. We will continue to monitor Richard Parrish's progress in the program. Richard Parrish is doing well in rehab.  He is up to level 5 on the T5! We will encourage him to try 4 lb hand weights.  We will continue to monitor his progress. Richard Parrish is doing well in rehab. He increased to 24 laps on the track. He also improved to level 6 on the T4 machine as well. We will encourage him to try 4 lb hand weights for resistance training. We will continue to monitor his progress in the program. Richard Parrish reports staying active and working a lot. He is still a member of  [employer].   Expected Outcomes Short: Use RPE daily to regulate intensity. Long: Follow program prescription in THR. Short: Continue to attend rehab. Long: Improve MET levels during exercise. Short: Try 4 lb hand weights Long: Continue to improve stamina Short: Try 4  lb hand weights Long: Continue to improve MET levels --    Row Name 09/06/21 1604 09/09/21 1615 09/23/21 0858 10/05/21 1528 10/18/21 1030     Exercise Goal Re-Evaluation   Exercise Goals Review Increase Physical Activity;Increase Strength and Stamina;Understanding of Exercise Prescription Increase Physical Activity;Increase Strength and Stamina;Understanding of Exercise Prescription;Able to understand and use rate of perceived exertion (RPE) scale;Able to understand and use Dyspnea scale;Knowledge and understanding of Target Heart Rate Range (THRR);Able to check pulse independently Increase Physical Activity;Increase Strength and Stamina;Understanding of Exercise Prescription Increase Physical Activity;Increase Strength and Stamina;Understanding of Exercise Prescription Increase Physical Activity;Increase Strength and Stamina;Understanding of Exercise Prescription   Comments Richard Parrish is doing well in rehab.  He is up to level 4 on the NuStep and level 6 on the XR.  He is also now using 6 lb weights.  He does have a difficult time arriving on time and says her keeps getting lost but has not brought it up to doctor yet.  We will continue to montior his progress. Reviewed home exercise with pt today.  Pt plans to continue walking at home for exercise.  He has been walking with work, but not consistent enough to count as exercise, so we talked about adding his time together at once.  Reviewed THR, pulse, RPE, sign and symptoms, pulse oximetery and when to call 911 or MD.  Also discussed weather considerations and indoor options.  Pt voiced understanding. Richard Parrish continues to do well in rehab.  He has increased to level 5 on both the T4 and T5 Nustep.  His oxygen is staying above 88% during exercise and RPEs are appropriate. He did not walk much last time and will encourage him to increase his amount of walking overtime. Will continue to monitor. Richard Parrish is doing well in rehab. He improved to level 6 on the bike. He did have to go back up from 3L to 4L of oxygen during exercise. Also, his overall average METs did go down a little since the last evaluation to 2.14 METs. We will continue to monitor his progress in the program. Richard Parrish has not been to rehab since last review. He ended up going to the ED last time he was here for chest pain- which turned out to not be cardiac related, per the notes. He wanted to see his cardiologist first before resuming back. We will continue to follow up with patient after he sees his MD.   Expected Outcomes Short: Continue to move up on workloads Long: Continue to improve stamina Short: Add in more time together to get 30 min Long: Conitnue to improve stamina Short: Increase walking and get laps up on track Long: Continue to increase overall MET level Short: Continue to push for more laps on track. Long: Continue to increase overall MET level. Short: Clear to come back to rehab Long: Continue to build up overall strength and stamina    Row Name 11/02/21 1606 11/18/21 1632 11/18/21 1641         Exercise Goal Re-Evaluation   Exercise Goals Review Increase Physical Activity;Increase Strength and Stamina;Understanding of Exercise Prescription Increase Physical Activity;Increase Strength and Stamina;Understanding of Exercise Prescription Increase Physical Activity;Increase Strength and Stamina;Understanding of Exercise Prescription     Comments Richard Parrish returned to rehab at end of last week.  He was able to return to his previous workloads without a problem.  We will continue to monitor his progress. Richard Parrish is doing well in rehab. He recently increased his overall average MET level back  up to 2.36 METs. He also improved to level 6 on the  T4, T5 and XR. Richard Parrish increased to 21 laps on the track as well. He is also due for his post 6MWT and is looking to improve on that. We will continue to monitor his progress in the program. Richard Parrish says he has been walking at home with his dogs for his exercise .He is getting ready to graduate next Wednesday and we hope to see improvement on his 6MWT. He plans to join [employer] and he also really wants to incorporate more strength work.     Expected Outcomes Short: Continue to attend regularly again Long: conitnue to rebuild stamina Short: Improve on post 6MWT Long: conitnue to build strength and stamina Short: Join PF and graduate Long: Exercise independently at appropriate prescription              Discharge Exercise Prescription (Final Exercise Prescription Changes):  Exercise Prescription Changes - 11/18/21 1600       Response to Exercise   Blood Pressure (Admit) 124/64    Blood Pressure (Exit) 120/60    Heart Rate (Admit) 75 bpm    Heart Rate (Exercise) 146 bpm    Heart Rate (Exit) 78 bpm    Oxygen Saturation (Admit) 98 %    Oxygen Saturation (Exercise) 90 %    Oxygen Saturation (Exit) 95 %    Rating of Perceived Exertion (Exercise) 13    Perceived Dyspnea (Exercise) 3    Symptoms SOB    Duration Continue with 30 min of aerobic exercise without signs/symptoms of physical distress.    Intensity THRR unchanged      Progression   Progression Continue to progress workloads to maintain intensity without signs/symptoms of physical distress.    Average METs 2.36      Resistance Training   Training Prescription Yes    Weight 6 lb    Reps 10-15      Interval Training   Interval Training No      Oxygen   Oxygen Continuous    Liters 4      NuStep   Level 6    Minutes 15      REL-XR   Level 6    Minutes 15    METs 3.5      T5 Nustep   Level 6    Minutes 15    METs 2.2      Track   Laps 21    Minutes 15    METs 2.14      Home Exercise Plan   Plans to continue  exercise at Home (comment)   walking, staff videos   Frequency Add 2 additional days to program exercise sessions.    Initial Home Exercises Provided 09/09/21      Oxygen   Maintain Oxygen Saturation 88% or higher             Nutrition:  Target Goals: Understanding of nutrition guidelines, daily intake of sodium <1573m, cholesterol <2040m calories 30% from fat and 7% or less from saturated fats, daily to have 5 or more servings of fruits and vegetables.  Education: All About Nutrition: -Group instruction provided by verbal, written material, interactive activities, discussions, models, and posters to present general guidelines for heart healthy nutrition including fat, fiber, MyPlate, the role of sodium in heart healthy nutrition, utilization of the nutrition label, and utilization of this knowledge for meal planning. Follow up email sent as well. Written material given at graduation. Flowsheet  Row Pulmonary Rehab from 11/17/2021 in [facility]  Date 09/22/21  Educator [facility]  Instruction Review Code 1- Verbalizes Understanding       Biometrics:  Pre Biometrics - 07/14/21 1300       Pre Biometrics   Height 6' 0.5" (1.842 m)    Weight 209 lb 12.8 oz (95.2 kg)    BMI (Calculated) 28.05    Single Leg Stand 9 seconds             Post Biometrics - 11/22/21 1604        Post  Biometrics   Height 6' 0.5" (1.842 m)    Weight 222 lb 6.4 oz (100.9 kg)    BMI (Calculated) 29.73             Nutrition Therapy Plan and Nutrition Goals:  Nutrition Therapy & Goals - 09/29/21 1544       Nutrition Therapy   Diet Heart healthy, low Na, pulmonary MNT    Drug/Food Interactions Statins/Certain Fruits    Protein (specify units) 120g    Fiber 30 grams    Whole Grain Foods 3 servings    Saturated Fats 16 max. grams    Fruits and Vegetables 8 servings/day    Sodium 2 grams      Personal Nutrition Goals   Nutrition Goal ST: practice MyPlate guidelines  LT:  meet calorie and protein needs while eating a heart healthy diet    Comments 71 y.o. M admitted to pulmonary rehab for ILD. PMHx includes CAD, pulmonary embolism, HTN, CAD, malignant neoplasm of R lung, pulmonary fibrosis. Relevant medications includes xanax, lipitor, vit D3, norco, protonic, paxil, prednisone, probiotic. PYP Score: 40. Vegetables & Fruits 5/12. Breads, Grains & Cereals 5/12. Red & Processed Meat 4/12. Poultry 0/2. Fish & Shellfish 2/4. Beans, Nuts & Seeds 3/4. Milk & Dairy Foods 2/6. Toppings, Oils, Seasonings & Salt 8/20. Sweets, Snacks & Restaurant Food 4/14. Beverages 7/10. He feels fatigued a lot of the time; he feels this is due to his shortness of breath. He feels that his appetite is lower than his baseline; has been that way for a couple of months and feels it is because of his [place]. B: Boost with cliff bar or sometimes a couple eggs with sausage and toast. S: fruit, Belvita, or cliff bar (grazing) D: TV dinners 1-2x/week or protein (red meat: grilled chuck and grilled steaks, chicken and fish less often) and vegetables (squash, cucumber, potatoes, broccoli) S: belvita. He reports that he is the one to normally cook dinner - he uses vegetable oil, he uses salt, but has cut back. Drinks: iced tea (slightly sweet tea -pure leaf), water with flavoring, diet soda (caffiene free). Richard Parrish sat in on nutrition education last week and reports he has no questions at this time; reviewed heart healthy eating and MyPlate structure. Discussed pulmonary MNT and the importance of meeting energy and protein needs as well as vitamin D and calcium while on steroids. He feels that his biggest barrier is cooking the food he has; he reports buying all the right food, but not cooking a lot of it due to fatigue. Discussed meal planning, ingredient prepping, and frozen vegetables. He is open to making changes.      Intervention Plan   Intervention Prescribe, educate and counsel regarding individualized  specific dietary modifications aiming towards targeted core components such as weight, hypertension, lipid management, diabetes, heart failure and other comorbidities.    Expected Outcomes Short Term Goal: Understand  basic principles of dietary content, such as calories, fat, sodium, cholesterol and nutrients.;Short Term Goal: A plan has been developed with personal nutrition goals set during dietitian appointment.;Long Term Goal: Adherence to prescribed nutrition plan.             Nutrition Assessments:  MEDIFICTS Score Key: ?70 Need to make dietary changes  40-70 Heart Healthy Diet ? 40 Therapeutic Level Cholesterol Diet  Flowsheet Row Pulmonary Rehab from 11/22/2021 in [facility] Pulmonary Re[facility]  Picture Your Plate Total Score on Discharge 52      Picture Your Plate Scores: <84 Unhealthy dietary pattern with much room for improvement. 41-50 Dietary pattern unlikely to meet recommendations for good health and room for improvement. 51-60 More healthful dietary pattern, with some room for improvement.  >60 Healthy dietary pattern, although there may be some specific behaviors that could be improved.   Nutrition Goals Re-Evaluation:  Nutrition Goals Re-Evaluation     [place] Name 07/22/21 1546 08/23/21 1628 09/09/21 1540 11/18/21 1646       Goals   Nutrition Goal Meet with the dietitian. -- Resheduled nutrition appt. for 09/21/21 at [place] did mention that he was having difficulty with waking up hungry in the middle of the night - discussed that it could be he is not meeting his needs during the day, encouraged to pay attention to hunger cues. Discussed heart healhty snacking. ST: practice MyPlate guidelines  LT: meet calorie and protein needs while eating a heart healthy diet    Comment Richard Parrish would like to meet with the dietitian. Resheduled nutrition appt. for 09/06/21 at 3pm. Resheduled nutrition appt. for 09/21/21 at [place] did mention that he was having difficulty with  waking up hungry in the middle of the night - discussed that it could be he is not meeting his needs during the day, encouraged to pay attention to hunger cues. Discussed heart healhty snacking. Richard Parrish states he has been focusing on smaller, more frequent meals. He feels he is meeting his needs. He admits he eats out too much and is trying to focus on meal prep to help reduce his days/week.  He is going to graduate next week.    Expected Outcome Short: meet with the dietitian. Long: adhere to a diet that pertains to him. -- -- Short: Graduate Long: Continue to eat healthy pulmonary diet             Nutrition Goals Discharge (Final Nutrition Goals Re-Evaluation):  Nutrition Goals Re-Evaluation - 11/18/21 1646       Goals   Nutrition Goal ST: practice MyPlate guidelines  LT: meet calorie and protein needs while eating a heart healthy diet    Comment Richard Parrish states he has been focusing on smaller, more frequent meals. He feels he is meeting his needs. He admits he eats out too much and is trying to focus on meal prep to help reduce his days/week.  He is going to graduate next week.    Expected Outcome Short: Graduate Long: Continue to eat healthy pulmonary diet             Psychosocial: Target Goals: Acknowledge presence or absence of significant depression and/or stress, maximize coping skills, provide positive support system. Participant is able to verbalize types and ability to use techniques and skills needed for reducing stress and depression.   Education: Stress, Anxiety, and Depression - Group verbal and visual presentation to define topics covered.  Reviews how body is impacted by stress, anxiety, and depression.  Also discusses healthy ways to reduce stress and to treat/manage anxiety and depression.  Written material given at graduation.   Education: Sleep Hygiene -Provides group verbal and written instruction about how sleep can affect your health.  Define sleep hygiene, discuss  sleep cycles and impact of sleep habits. Review good sleep hygiene tips.    Initial Review & Psychosocial Screening:  Initial Psych Review & Screening - 07/05/21 1144       Initial Review   Current issues with Current Anxiety/Panic   occassional anxiety has Xanax for this     Family Dynamics   Good Support System? Yes   wife, 43 years     Barriers   Psychosocial barriers to participate in program There are no identifiable barriers or psychosocial needs.      Screening Interventions   Interventions Encouraged to exercise    Expected Outcomes Short Term goal: Utilizing psychosocial counselor, staff and physician to assist with identification of specific Stressors or current issues interfering with healing process. Setting desired goal for each stressor or current issue identified.;Long Term Goal: Stressors or current issues are controlled or eliminated.;Short Term goal: Identification and review with participant of any Quality of Life or Depression concerns found by scoring the questionnaire.;Long Term goal: The participant improves quality of Life and PHQ9 Scores as seen by post scores and/or verbalization of changes             Quality of Life Scores:  Scores of 19 and below usually indicate a poorer quality of life in these areas.  A difference of  2-3 points is a clinically meaningful difference.  A difference of 2-3 points in the total score of the Quality of Life Index has been associated with significant improvement in overall quality of life, self-image, physical symptoms, and general health in studies assessing change in quality of life.  PHQ-9: Review Flowsheet  More data exists      11/22/2021 07/14/2021 06/01/2016 12/30/2015 11/18/2015  Depression screen PHQ 2/9  Decreased Interest 2 1 0 0 -  Down, Depressed, Hopeless 1 1 0 0 0  PHQ - 2 Score 3 2 0 0 0  Altered sleeping 3 2 - - -  Tired, decreased energy 3 2 - - -  Change in appetite 3 2 - - -  Feeling bad or failure  about yourself  2 1 - - -  Trouble concentrating 2 1 - - -  Moving slowly or fidgety/restless 2 0 - - -  Suicidal thoughts 0 0 - - -  PHQ-9 Score 18 10 - - -  Difficult doing work/chores Somewhat difficult Somewhat difficult - - -   Interpretation of Total Score  Total Score Depression Severity:  1-4 = Minimal depression, 5-9 = Mild depression, 10-14 = Moderate depression, 15-19 = Moderately severe depression, 20-27 = Severe depression   Psychosocial Evaluation and Intervention:  Psychosocial Evaluation - 07/05/21 1159       Psychosocial Evaluation & Interventions   Interventions Encouraged to exercise with the program and follow exercise prescription;Relaxation education    Comments Richard Parrish has no barriers to attending the program. He is looking forward to regaining his strength and stamina. 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 He lives with his wife and their fur babies. She is his support. THey havebeen married for 43 years. He wants to get back to his previou strength. He does have meds as needed for anxiety.  He does not need the medicine too frequently.  He has lost over 20 pounds after his surgery. Heis ready to see if he can gain back energy and musclemass he has lost    Expected Outcomes STG Richard Parrish is able to attend all scheduled sessions, He continues to remain tobacco free.   LTG Richard Parrish continues to have progressive improvement with his energy and strength, He uses his anxiety meds even less after discharge    Continue Psychosocial Services  Follow up required by staff             Psychosocial Re-Evaluation:  Psychosocial Re-Evaluation     [place] Name 07/22/21 1547 08/23/21 1608 09/09/21 1554 11/18/21 1710       Psychosocial Re-Evaluation   Current issues with Current Depression;History of Depression;Current Psychotropic Meds Current Depression;History of Depression;Current Psychotropic Meds;Current Sleep Concerns Current  Depression;History of Depression;Current Psychotropic Meds;Current Sleep Concerns Current Depression;History of Depression;Current Psychotropic Meds    Comments He states that his mood has improved since he is over the effects of his lung surgery. It took him 3 months to feel better. His mood with him being able to exercise and do more has helped improve his mood dramatically. He has some properties to take care of and wants to do rehab two days a week. He reports his mood has been better with no depressive symptoms recently - he thinks the vacation helped! He went to beach with his wife, nephews, sister-in-law. He went fishing as well and is excited about a fishing trip he is taking in September. He is still taking medication as needed and feels it is helping. He can rely on his wife for support. He loves his dogs, friends and family. He will think of something else when his mind gets stressed. He reports not sleeping well - he sleeps in the same bed as one of the dogs and some of them will wake him up. He reports his mood has been better with no depressive symptoms recently. He reports even other people has noticed as well and especially his wife. He enjoys fishing and is excited about a fishing trip he is taking in September, he is alos excited to bow hunt and go walking outside. He is still taking medication as needed and feels it is helping. He can rely on his wife for support. He loves his dogs, friends and family. He will think of something else when his mind gets stressed. He reports not sleeping well, but it has been getting better due to physical activity - he sleeps in the same bed as one of the dogs and some of them will wake him up. Richard Parrish is doing better mentally. He states he is aware of his moods and where he is and how he feels and manages it well. He takes Alprazolam as needed for anxiety and feels it helps him. He is taking his dogs for walks almost every day and really enjoying his time with them.  His wife is good support. He has no problems with sleep and feels his oxygen and SOB has improved overall. He is graduating next week and really enjoyed the program, helping him hold accountable and watching his vitals closely.    Expected Outcomes Short: Start [facility] to help with mood. Long: Maintain a healthy mental state Short: Continue to attend [facility] to help with mood. Long: Maintain a healthy mental state Short: Continue to attend [facility] to help with mood. Long: Maintain a healthy mental state Short: Graduate Long:  Maintain positive attitude    Interventions Encouraged to attend Pulmonary Rehabilitation for the exercise Encouraged to attend Pulmonary Rehabilitation for the exercise Encouraged to attend Pulmonary Rehabilitation for the exercise Encouraged to attend Pulmonary Rehabilitation for the exercise    Continue Psychosocial Services  Follow up required by staff Follow up required by staff Follow up required by staff Follow up required by staff             Psychosocial Discharge (Final Psychosocial Re-Evaluation):  Psychosocial Re-Evaluation - 11/18/21 1710       Psychosocial Re-Evaluation   Current issues with Current Depression;History of Depression;Current Psychotropic Meds    Comments Richard Parrish is doing better mentally. He states he is aware of his moods and where he is and how he feels and manages it well. He takes Alprazolam as needed for anxiety and feels it helps him. He is taking his dogs for walks almost every day and really enjoying his time with them. His wife is good support. He has no problems with sleep and feels his oxygen and SOB has improved overall. He is graduating next week and really enjoyed the program, helping him hold accountable and watching his vitals closely.    Expected Outcomes Short: Graduate Long: Maintain positive attitude    Interventions Encouraged to attend Pulmonary Rehabilitation for the exercise    Continue Psychosocial Services  Follow up  required by staff             Education: Education Goals: Education classes will be provided on a weekly basis, covering required topics. Participant will state understanding/return demonstration of topics presented.  Learning Barriers/Preferences:  Learning Barriers/Preferences - 07/05/21 1158       Learning Barriers/Preferences   Learning Barriers Hearing    Learning Preferences None             General Pulmonary Education Topics:  Infection Prevention: - Provides verbal and written material to individual with discussion of infection control including proper hand washing and proper equipment cleaning during exercise session. Flowsheet Row Pulmonary Rehab from 11/17/2021 in [facility] Cardiac and Pulmonary Rehab  Education need identified 07/14/21  Date 07/14/21  Educator [place]  Instruction Review Code 1- Verbalizes Understanding       Falls Prevention: - Provides verbal and written material to individual with discussion of falls prevention and safety. Flowsheet Row Pulmonary Rehab from 07/05/2021 in [facility] Card[facility] and Pulmonary Rehab  Date 07/05/21  Educator SB  Instruction Review Code 1- Verbalizes Understanding       Chronic Lung Disease Review: - Group verbal instruction with posters, models, PowerPoint presentations and videos,  to review new updates, new respiratory medications, new advancements in procedures and treatments. Providing information on websites and "800" numbers for continued self-education. Includes information about supplement oxygen, available portable oxygen systems, continuous and intermittent flow rates, oxygen safety, concentrators, and Medicare reimbursement for oxygen. Explanation of Pulmonary Drugs, including class, frequency, complications, importance of spacers, rinsing mouth after steroid MDI's, and proper cleaning methods for nebulizers. Review of basic lung anatomy and physiology related to function, structure, and complications of lung disease.  Review of risk factors. Discussion about methods for diagnosing sleep apnea and types of masks and machines for OSA. Includes a review of the use of types of environmental controls: home humidity, furnaces, filters, dust mite/pet prevention, HEPA vacuums. Discussion about weather changes, air quality and the benefits of nasal washing. Instruction on Warning signs, infection symptoms, calling MD promptly, preventive modes, and value of vaccinations. Review of effective  airway clearance, coughing and/or vibration techniques. Emphasizing that all should Create an Action Plan. Written material given at graduation. Flowsheet Row Pulmonary Rehab from 11/17/2021 in [facility] Cardiac and Pulmonary Rehab  Education need identified 07/14/21       AED/CPR: - Group verbal and written instruction with the use of models to demonstrate the basic use of the AED with the basic ABC's of resuscitation.    Anatomy and Cardiac Procedures: - Group verbal and visual presentation and models provide information about basic cardiac anatomy and function. Reviews the testing methods done to diagnose heart disease and the outcomes of the test results. Describes the treatment choices: Medical Management, Angioplasty, or Coronary Bypass Surgery for treating various heart conditions including Myocardial Infarction, Angina, Valve Disease, and Cardiac Arrhythmias.  Written material given at graduation. Flowsheet Row Pulmonary Rehab from 11/17/2021 in [facility] Cardiac and Pulmonary Rehab  Date 11/17/21  Educator SB  Instruction Review Code 1- Verbalizes Understanding       Medication Safety: - Group verbal and visual instruction to review commonly prescribed medications for heart and lung disease. Reviews the medication, class of the drug, and side effects. Includes the steps to properly store meds and maintain the prescription regimen.  Written material given at graduation.   Other: -Provides group and verbal instruction on various  topics (see comments)   Knowledge Questionnaire Score:  Knowledge Questionnaire Score - 11/22/21 1555       Knowledge Questionnaire Score   Post Score 15/18              Core Components/Risk Factors/Patient Goals at Admission:  Personal Goals and Risk Factors at Admission - 07/14/21 1309       Core Components/Risk Factors/Patient Goals on Admission    Weight Management Yes;Weight Loss   Patient lost 25 pounds since Jan 2023, wants to gain muscle   Intervention Weight Management: Develop a combined nutrition and exercise program designed to reach desired caloric intake, while maintaining appropriate intake of nutrient and fiber, sodium and fats, and appropriate energy expenditure required for the weight goal.;Weight Management: Provide education and appropriate resources to help participant work on and attain dietary goals.;Weight Management/Obesity: Establish reasonable short term and long term weight goals.    Admit Weight 209 lb (94.8 kg)   was 231 prior to his surgery in Jan 2023   Goal Weight: Short Term 204 lb (92.5 kg)    Goal Weight: Long Term 195 lb (88.5 kg)    Expected Outcomes Short Term: Continue to assess and modify interventions until short term weight is achieved;Weight Loss: Understanding of general recommendations for a balanced deficit meal plan, which promotes 1-2 lb weight loss per week and includes a negative energy balance of [phone] kcal/d;Understanding recommendations for meals to include 15-35% energy as protein, 25-35% energy from fat, 35-60% energy from carbohydrates, less than 229m of dietary cholesterol, 20-35 gm of total fiber daily;Understanding of distribution of calorie intake throughout the day with the consumption of 4-5 meals/snacks;Long Term: Adherence to nutrition and physical activity/exercise program aimed toward attainment of established weight goal    Improve shortness of breath with ADL's Yes    Intervention Provide education, individualized  exercise plan and daily activity instruction to help decrease symptoms of SOB with activities of daily living.    Expected Outcomes Short Term: Improve cardiorespiratory fitness to achieve a reduction of symptoms when performing ADLs    Increase knowledge of respiratory medications and ability to use respiratory devices properly  Yes  Intervention Provide education and demonstration as needed of appropriate use of medications, inhalers, and oxygen therapy.    Expected Outcomes Short Term: Achieves understanding of medications use. Understands that oxygen is a medication prescribed by physician. Demonstrates appropriate use of inhaler and oxygen therapy.;Long Term: Maintain appropriate use of medications, inhalers, and oxygen therapy.    Hypertension Yes    Intervention Provide education on lifestyle modifcations including regular physical activity/exercise, weight management, moderate sodium restriction and increased consumption of fresh fruit, vegetables, and low fat dairy, alcohol moderation, and smoking cessation.;Monitor prescription use compliance.    Expected Outcomes Short Term: Continued assessment and intervention until BP is < 140/61m HG in hypertensive participants. < 130/888mHG in hypertensive participants with diabetes, heart failure or chronic kidney disease.;Long Term: Maintenance of blood pressure at goal levels.    Lipids Yes    Intervention Provide education and support for participant on nutrition & aerobic/resistive exercise along with prescribed medications to achieve LDL <706mHDL >24m55m  Expected Outcomes Short Term: Participant states understanding of desired cholesterol values and is compliant with medications prescribed. Participant is following exercise prescription and nutrition guidelines.;Long Term: Cholesterol controlled with medications as prescribed, with individualized exercise RX and with personalized nutrition plan. Value goals: LDL < 70mg73mL > 40 mg.              Education:Diabetes - Individual verbal and written instruction to review signs/symptoms of diabetes, desired ranges of glucose level fasting, after meals and with exercise. Acknowledge that pre and post exercise glucose checks will be done for 3 sessions at entry of program.   Know Your Numbers and Heart Failure: - Group verbal and visual instruction to discuss disease risk factors for cardiac and pulmonary disease and treatment options.  Reviews associated critical values for Overweight/Obesity, Hypertension, Cholesterol, and Diabetes.  Discusses basics of heart failure: signs/symptoms and treatments.  Introduces Heart Failure Zone chart for action plan for heart failure.  Written material given at graduation. Flowsheet Row [facility]  Education need identified 10/06/21  Date 10/06/21  Educator SB  Instruction Review Code 1- Verbalizes Understanding       Core Components/Risk Factors/Patient Goals Review:   Goals and Risk Factor Review     Row Name 07/22/21 1545 08/23/21 1603 09/09/21 1541 10/27/21 1617 11/18/21 1644     Core Components/Risk Factors/Patient Goals Review   Personal Goals Review Improve shortness of breath with ADL's Improve shortness of breath with ADL's Improve shortness of breath with ADL's;Weight Management/Obesity;Hypertension Improve shortness of breath with ADL's;Weight Management/Obesity;Hypertension Improve shortness of breath with ADL's;Weight Management/Obesity;Hypertension   Review Spoke to patient about their shortness of breath and what they can do to improve. Patient has been informed of breathing techniques when starting the program. Patient is informed to tell staff if they have had any med changes and that certain meds they are taking or not taking can be causing shortness of breath. Richard Parrish reports his shortness of breath has been improving since starting rehab. He checks his O2 all the time and  keeps a pulse oximeter on him - he makes sure his O2 does not dip below 88. He practices PLB when he gets short of breath. He also practices diaphragmatic breathing laying down. His weight was elevated 6lbs since 2 weeks ago - he went on vacation and ate out a lot. He is taking all of his medications as prescribed. Only issue with Esbriet which is costly and  is giving him difficulty with his memory. Encouraged him to make MD aware if he hasn't already. Richard Parrish reports his weight is back up, 6 pounds in less than a week. He reports not being on a diuretic right now, he did have more salt that weekend, but does not have additional swelling or shortness of breath; encouraged him to let his doctor know as this could be fluid buildup. Richard Parrish reports his shortness of breath has gotten worse about a month ago. He will take breaks as needed when performing ADLs; he has two pulse oximeters which he uses - he knows his O2 should stay above 88%. He continues to practice diaphragmatic breathing. He continues to use PLB when he has shortness of breath and feels this helps. Richard Parrish reports checking his BP and reports the top number can get high 140s/150s over 80 or so. Today his BP was 112/66. He continues to take his medications as dirested with no issues. Richard Parrish has been out of rehab since 10/06/21 when he was sent to the ER for chest tightness and shortness of breath. Richard Parrish reports that the doctor changed his medications and would like to monitor his progress. He is excited to return to rehab. He reports since then he hasn't had to take any nitro and reports feeling well and being more active. Richard Parrish reports he recently went to the beach where he went swimming and fishing. He plans to resume activity at home walking around his property, but he continues to get short of breath - encouraged him to continue to check his O2 with a pulse oximeter. He continues to take his medications as prescribed, but he has some insomia and lower appetite.  Discussed mechanical eating if he continues to have a lower appetite. Richard Parrish has been feeling better since he has been back from the ED. He is no longer experiencing chest pain. Overall, he feels his SOB has improved since first starting the program, he can do more at home as he couldn't before. He can now walk his dogs outside with no problem. He is checking his BP at home and stays consistent, sometimes it runs lows but focuses on hydration. BP at rehab have been good. His weight is staying stable as well. He is going to graduate next week,   Expected Outcomes Short: Attend [facility] regularly to improve shortness of breath with ADL's. Long: maintain independence with ADL's Short: Attend [facility] regularly to improve shortness of breath with ADL's. Long: maintain independence with ADL's Short: Attend [facility] regularly to improve shortness of breath with ADL's. Long: maintain independence with ADL's Short: Attend [facility] regularly to improve shortness of breath with ADL's. Long: maintain independence with ADL's Short: Graduate Long: Continue to manage lifestyle risk factors at home            Core Components/Risk Factors/Patient Goals at Discharge (Final Review):   Goals and Risk Factor Review - 11/18/21 1644       Core Components/Risk Factors/Patient Goals Review   Personal Goals Review Improve shortness of breath with ADL's;Weight Management/Obesity;Hypertension    Review Richard Parrish has been feeling better since he has been back from the ED. He is no longer experiencing chest pain. Overall, he feels his SOB has improved since first starting the program, he can do more at home as he couldn't before. He can now walk his dogs outside with no problem. He is checking his BP at home and stays consistent, sometimes it runs lows but focuses on hydration. BP at rehab have  been good. His weight is staying stable as well. He is going to graduate next week,    Expected Outcomes Short: Graduate Long: Continue to  manage lifestyle risk factors at home             ITP Comments:  ITP Comments     Row Name 07/05/21 1155 07/14/21 1225 07/19/21 1532 08/11/21 1133 09/08/21 0755   ITP Comments Virtual orientation call completed today. he has an appointment on Date: 05/17/20253  for EP eval and gym Orientation.  Documentation of diagnosis [facility] be found in [facility]  Date: 06/17/2021 .      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 Intervention for tobacco cessation was provided at the initial medical review. He was asked about  quitting and reported he quit all in Nov 2022 . Staff will continue to provide encouragement and follow up with the patient throughout the program to prevent relapse. Completed 6MWT and gym orientation. Initial ITP created and sent for review to Dr. Ottie Glazier, Medical Director. First full day of exercise!  Patient was oriented to gym and equipment including functions, settings, policies, and procedures.  Patient's individual exercise prescription and treatment plan were reviewed.  All starting workloads were established based on the results of the 6 minute walk test done at initial orientation visit.  The plan for exercise progression was also introduced and progression will be customized based on patient's performance and goals. 30 Day review completed. Medical Director ITP review done, changes made as directed, and signed approval by Medical Director. 30 Day review completed. Medical Director ITP review done, changes made as directed, and signed approval by Medical Director.    Row Name 10/06/21 0700 10/14/21 0824 10/26/21 1442 11/03/21 0828 11/24/21 1644   ITP Comments 30 Day review completed. Medical Director ITP review done, changes made as directed, and signed approval by Medical Director. Patient went to ED on 8/9. Needs to see cardiology for follow up first to clear to come back and manage his medications appropriately. Place on medical hold until he sees MD. Patient was cleared by his  cardiologist to return to Pulmonary Rehab. Called patient and left voicemail to follow up. 30 Day review completed. Medical Director ITP review done, changes made as directed, and signed approval by Medical Director. Richard Parrish graduated today from  rehab with 36 sessions completed.  Details of the patient's exercise prescription and what He needs to do in order to continue the prescription and progress were discussed with patient.  Patient was given a copy of prescription and goals.  Patient verbalized understanding.  Richard Parrish plans to continue to exercise by walking.            Comments: Discharge ITP

## 2021-11-24 NOTE — Progress Notes (Signed)
Discharge Summary: Richard Parrish (DOB [birth year])  [name] graduated today from  rehab with 36 sessions completed.  Details of the patient's exercise prescription and what He needs to do in order to continue the prescription and progress were discussed with patient.  Patient was given a copy of prescription and goals.  Patient verbalized understanding.  [name] plans to continue to exercise by walking.   [place] Name 07/14/21 1301 11/22/21 1602       6 Minute Walk   Phase Initial Discharge    Distance 815 feet 1070 feet    Distance % Change -- 31.2 %    Distance Feet Change -- 255 ft    Walk Time 6 minutes 6 minutes    # of Rest Breaks 1  Stopped at 5:51 0    MPH 1.54 2.02    METS 1.92 2.33    RPE 11 12    Perceived Dyspnea  2 1    VO2 Peak 6.74 8.15    Symptoms Yes (comment) Yes (comment)    Comments SOB, back pain 5/10, hip pain 4/10 SOB    Resting HR 79 bpm 78 bpm    Resting BP 110/68 122/68    Resting Oxygen Saturation  93 % 97 %    Exercise Oxygen Saturation  during 6 min walk 85 % 88 %    Max Ex. HR 97 bpm 100 bpm    Max Ex. BP 126/72 132/70    2 Minute Post BP 112/68 --      Interval HR   1 Minute HR 79 91    2 Minute HR 92 98    3 Minute HR 97 96    4 Minute HR 97 100    5 Minute HR 97 97    6 Minute HR 96 97    2 Minute Post HR 75 85    Interval Heart Rate? Yes Yes      Interval Oxygen   Interval Oxygen? Yes Yes    Baseline Oxygen Saturation % 93 % 97 %    1 Minute Oxygen Saturation % 92 % 94 %    1 Minute Liters of Oxygen 5 L 4 L    2 Minute Oxygen Saturation % 90 % 91 %    2 Minute Liters of Oxygen 5 L 4 L    3 Minute Oxygen Saturation % 86 % 90 %    3 Minute Liters of Oxygen 5 L 4 L    4 Minute Oxygen Saturation % 85 % 88 %    4 Minute Liters of Oxygen 5 L 4 L    5 Minute Oxygen Saturation % 86 % 89 %    5 Minute Liters of Oxygen 5 L 4 L    6 Minute Oxygen Saturation % 87 % 89 %    6 Minute Liters of Oxygen 5 L 4 L    2 Minute Post  Oxygen Saturation % 96 % 95 %    2 Minute Post Liters of Oxygen 5 L 4 L

## 2021-11-30 ENCOUNTER — Other Ambulatory Visit (HOSPITAL_COMMUNITY): Payer: Self-pay

## 2021-12-09 ENCOUNTER — Telehealth: Payer: Self-pay

## 2021-12-09 NOTE — Telephone Encounter (Signed)
Pt's wife, Zada Girt, called stating that the pt's L leg was swollen from thigh to foot. She was requesting advice.  Reviewed pt's chart, returned call for clarification, two identifiers used. Pt is experiencing moderate swelling of his L leg beginning over the past couple of days. Pt denies any pain, any cold feet/toes, or any discoloration. Pt had gotten into some poison ivy a few days ago and had broken out. He also has some small scratches on his leg. Pt was reassured and instructed to elevate the leg, wear compression/ACE wrap, and use Calamine/Benadryl cream for the itching. Ferne asked if an urgent care was appropriate and he was given that option or to monitor at home. Pt to call if symptoms worsen. Confirmed understanding.

## 2021-12-10 ENCOUNTER — Other Ambulatory Visit: Payer: Self-pay | Admitting: Internal Medicine

## 2021-12-10 ENCOUNTER — Ambulatory Visit
Admission: RE | Admit: 2021-12-10 | Discharge: 2021-12-10 | Disposition: A | Discharge status: Home / Self Care | Payer: PPO | Source: Ambulatory Visit | Attending: Internal Medicine | Admitting: Internal Medicine

## 2021-12-10 DIAGNOSIS — J849 Interstitial pulmonary disease, unspecified: Secondary | ICD-10-CM

## 2021-12-15 ENCOUNTER — Other Ambulatory Visit: Payer: Self-pay | Admitting: Interventional Cardiology

## 2021-12-20 ENCOUNTER — Encounter: Payer: Self-pay | Admitting: Pulmonary Disease

## 2021-12-20 ENCOUNTER — Ambulatory Visit (INDEPENDENT_AMBULATORY_CARE_PROVIDER_SITE_OTHER): Payer: PPO | Admitting: Pulmonary Disease

## 2021-12-20 VITALS — BP 112/64 | HR 62 | Temp 97.4°F | Ht 72.0 in | Wt 221.0 lb

## 2021-12-20 DIAGNOSIS — Z5181 Encounter for therapeutic drug level monitoring: Secondary | ICD-10-CM

## 2021-12-20 DIAGNOSIS — R0602 Shortness of breath: Secondary | ICD-10-CM

## 2021-12-20 DIAGNOSIS — J84112 Idiopathic pulmonary fibrosis: Secondary | ICD-10-CM

## 2021-12-20 LAB — COMPREHENSIVE METABOLIC PANEL
ALT: 21 U/L (ref 0–53)
AST: 27 U/L (ref 0–37)
Albumin: 4.2 g/dL (ref 3.5–5.2)
Alkaline Phosphatase: 81 U/L (ref 39–117)
BUN: 15 mg/dL (ref 6–23)
CO2: 29 mEq/L (ref 19–32)
Calcium: 9.3 mg/dL (ref 8.4–10.5)
Chloride: 104 mEq/L (ref 96–112)
Creatinine, Ser: 0.81 mg/dL (ref 0.40–1.50)
GFR: 87.97 mL/min (ref >60.00)
Glucose, Bld: 94 mg/dL (ref 70–99)
Potassium: 4 mEq/L (ref 3.5–5.1)
Sodium: 142 mEq/L (ref 135–145)
Total Bilirubin: 0.4 mg/dL (ref 0.2–1.2)
Total Protein: 6.8 g/dL (ref 6.0–8.3)

## 2021-12-20 NOTE — Patient Instructions (Signed)
I am glad you are doing better with your breathing We will check labs today including complaints metabolic panel, proBNP for dyspnea Continue the Esbriet Follow-up in 4 months.

## 2021-12-20 NOTE — Progress Notes (Signed)
Richard Parrish    [id number]    1950/05/16  Primary Care Physician:Polite, Jori Moll, MD  Referring Physician: Seward Carol, MD 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  Complains of chronic dyspnea on exertion, cough.  No fevers or chills  Imaging showed right upper lobe mass and he underwent combined bronchoscopy with Dr. Valeta Harms and subsequently right upper lobe robotic resection by Dr. Kipp Brood on 03/03/2021.  Saw Dr. Julien Nordmann from oncology on 03/24/2021 and no further chemotherapy recommended  Postsurgery he had recurrent issues pneumothorax and mildly exacerbation Treated in Jan with levofloxacin and prednisone. He had needle decompression and chest tube placed with Heimlich valve and mini VAC for pneumothorax.  Hospitalized on 2/26 under the care of cardiothoracic surgery.  Discharged on amoxicillin and additional prednisone taper.  Hospitalized in March 2023 with submassive pulmonary embolism, left lower extremity DVT.  Started on Eliquis.  He was also started on prednisone for ILD exacerbation continues at 50 mg/day  Pets: 3 cats Occupation: Used to work in Librarian, academic at [employer] Exposures: Exposed to [employer] carrier. Smoking history: 30-pack-year smoker.  Quit in 1989.  Currently vapes which helps with his back. Travel history: No significant recent travel history Relevant family history: No family history of lung disease  Interim history: Started Esbriet in May 2023.   So far is tolerating it well without any problem. Continues on prednisone taper and is currently at 20 mg a day  His spine doctor is requesting clearance for lumbar nerve root block  Outpatient Encounter Medications as of 12/20/2021  Medication Sig   acetaminophen (TYLENOL) 500 MG tablet Take 1 tablet (500 mg total) by mouth every 6 (six) hours as needed for mild pain.   ALPRAZolam (XANAX) 0.5 MG tablet Take 0.5-1 tablets (0.25-0.5 mg total) by mouth 3 (three) times daily as needed for anxiety or sleep.   antiseptic oral rinse (BIOTENE) LIQD 15 mLs by Mouth Rinse route daily as needed for dry mouth.   atorvastatin (LIPITOR) 80 MG tablet TAKE 1 TABLET BY MOUTH EVERY DAY   budesonide (ENTOCORT EC) 3 MG 24 hr capsule Take 3 mg by mouth 3 (three) times daily.   cholecalciferol (VITAMIN D3) 25 MCG (1000 UNIT) tablet Take 1,000 Units by mouth in the morning.   clopidogrel (PLAVIX) 75 MG tablet TAKE 1 TABLET BY MOUTH EVERY DAY   diphenhydrAMINE (BENADRYL) 25 mg capsule Take 25 mg by mouth at bedtime as needed for sleep (for sleep).   ELIQUIS 5 MG TABS tablet TAKE 1 TABLET BY MOUTH TWICE A DAY   gabapentin (NEURONTIN) 100 MG capsule Take 2 capsules (200 mg total) by mouth 3 (three) times daily.   guaiFENesin (MUCINEX) 600 MG 12 hr tablet Take 1 tablet (600 mg total) by mouth 2 (two) times daily as needed. (Patient taking differently: Take 600 mg by mouth 2 (two) times daily as needed for cough or to loosen phlegm.)   HYDROcodone-acetaminophen (NORCO) 7.5-325 MG tablet Take 1 tablet by mouth every 4 (four) hours as needed.   hyoscyamine (LEVSIN,  ANASPAZ) 0.125 MG tablet Take 0.125 mg by mouth every 4 (four) hours as needed for cramping.    metoprolol succinate (TOPROL-XL) 50 MG 24 hr tablet Take 0.5 tablets (25 mg total) by mouth daily. Take with or immediately following a meal.   nitroGLYCERIN (NITROSTAT) 0.4 MG SL tablet Place 0.4 mg under the tongue every 5 (five) minutes as needed for chest pain.    pantoprazole (PROTONIX) 40 MG tablet TAKE 1 TABLET BY MOUTH EVERY DAY BEFORE BREAKFAST   PARoxetine (PAXIL) 40 MG tablet Take 40 mg by mouth in the morning.   Pirfenidone 801 MG TABS Take 1 tablet by mouth 3 (three) times daily with meals.   predniSONE (DELTASONE) 10 MG tablet Take 40mg  daily for 1 month then reduce 10mg  monthly   Probiotic Product (PROBIOTIC PO) Take 1 capsule by mouth daily.   sodium chloride (OCEAN) 0.65 % SOLN nasal spray Place 1 spray into both nostrils daily as needed for congestion.   sulfamethoxazole-trimethoprim (BACTRIM DS) 800-160 MG tablet Take 1 tablet by mouth 3 (three) times a week.   tamsulosin (FLOMAX) 0.4 MG CAPS capsule Take 0.4 mg by mouth 2 (two) times daily.   No facility-administered encounter medications on file as of 12/20/2021.    Physical Exam: Blood pressure 132/66, pulse 89, temperature (!) 97.5 F (36.4 C), temperature source Oral, height 6' (1.829 m), weight 219 lb 12.8 oz (99.7 kg), SpO2 96 %. Gen:      No acute distress HEENT:  EOMI, sclera anicteric Neck:     No masses; no thyromegaly Lungs:    Bibasal crackles CV:         Regular rate and rhythm; no murmurs Abd:      + bowel sounds; soft, non-tender; no palpable masses, no distension Ext:    No edema; adequate peripheral perfusion Skin:      Warm and dry; no rash Neuro: alert and oriented x 3 Psych: normal mood and affect   Data Reviewed: Imaging: CT chest 07/18/2013-interstitial fibrosis with traction bronchiectasis with basilar predominance, emphysema.  Probable UIP pattern CT abdomen pelvis 02/07/17- basal pulmonary fibrosis. CT high-res 12/18/2020-new spiculated lung nodule in the right upper lobe PET scan 01/05/2021-uptake in the right upper lobe, equivocal uptake in the right hilum.  Left sphenoid sinus disease. Chest x-ray 04/01/2021-dense right lower lobe infiltrate CTA 04/13/2021-no PE, large right pneumothorax CTA 05/18/2021-left-sided pulmonary embolism, worsening groundglass  opacities, airspace disease-right greater than left. High-resolution CT 07/13/21 - resolution of consolidation and asbestos disease. Worsening pattern of pulmonary fibrosis in UIP pattern CT chest 09/17/2021-stable pattern of pulmonary fibrosis I have reviewed the images personally  PFTs: 11/27/2020 FVC 4.03 [85%], FEV1 2.95 [84%], F/F 73, TLC 6.09 [81%], DLCO 20.01 [73%] Minimal diffusion defect.  Labs: CTD serologies 11/26/2020-P ANCA 1:80  Assessment:  Preop evaluation lumbar injections He is due to get lumbar root block on 10/12/2021.  This is done with local anesthesia.  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 Elevation in ANCA is likely nonspecific as he has no signs of connective tissue disease.  I reviewed his biopsy with Dr. 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  So far is tolerating it well Recent labs earlier this month are stable. Continues on pulmonary rehab  Slow prednisone taper by 10 mg every month.  Currently at 20 mg/day  Recurrent right pneumothorax Resolved on repeat imaging  Right upper lobe lung adenocarcinoma S/p bronchoscopy and lobectomy Continue surveillance surveillance CT scan  Submassive PE Continue Eliquis  Plan/Recommendations: Continue Esbriet Continue prednisone taper Pulmonary rehab  Marshell Garfinkel MD [facility] 12/20/2021, 10:20 AM  CC: Seward Carol, MD

## 2021-12-20 NOTE — Progress Notes (Signed)
HENSLEY TREAT    [id number]    January 31, 1951  Primary Care Physician:Polite, Jori Moll, MD  Referring Physician: Seward Carol, MD 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  with history of pulmonary fibrosis, GERD, hypertension, hyperlipidemia, pulmonary fibrosis He was diagnosed with unspecified pulmonary fibrosis and was seen in pulmonary clinic in 2015  Developed COVID-19 in March 2021 and reports worsening dyspnea since then.  Complains of chronic dyspnea on exertion, cough.  No fevers or chills  Imaging showed right upper lobe mass and he underwent combined bronchoscopy with Dr. Valeta Harms and subsequently right upper lobe robotic resection by Dr. Kipp Brood on 03/03/2021.  Saw Dr. Julien Nordmann from oncology on 03/24/2021 and no further chemotherapy recommended  Postsurgery he had recurrent issues pneumothorax and mildly exacerbation Treated in Jan with levofloxacin and prednisone. He had needle decompression and chest tube placed with Heimlich valve and mini VAC for pneumothorax.  Hospitalized on 2/26 under the care of cardiothoracic surgery.  Discharged on amoxicillin and additional prednisone taper.  Hospitalized in March 2023 with submassive pulmonary embolism, left lower extremity DVT.  Started on Eliquis.  He was also started on prednisone for ILD exacerbation continues at 50 mg/day  Pets: 3 cats Occupation: Used to work in Librarian, academic at [employer] Exposures: Exposed to [employer] carrier. Smoking history: 30-pack-year smoker.  Quit in 1989.  Currently vapes which helps with his back. Travel history: No significant recent travel history Relevant family history: No family history of lung disease  Interim history: Started Esbriet in May  2023.  So far is tolerating it well without any problem. He has tapered off prednisone in September 2023 Overall he is doing better in terms of his breathing.  He is back to deer hunting which he loves  Continues on supplemental oxygen. Finished pulmonary rehab in September 2023. Had some edema for which she was given Lasix by his primary care.  Outpatient Encounter Medications as of 12/20/2021  Medication Sig   acetaminophen (TYLENOL) 500 MG tablet Take 1 tablet (500 mg total) by mouth every 6 (six) hours as needed for mild pain.   ALPRAZolam (XANAX) 0.5 MG tablet Take 0.5-1 tablets (0.25-0.5 mg total) by mouth 3 (three) times daily as needed for anxiety or sleep.   antiseptic oral rinse (BIOTENE) LIQD 15 mLs by Mouth Rinse route daily as needed for dry mouth.   atorvastatin (LIPITOR) 80 MG tablet TAKE 1 TABLET BY MOUTH EVERY DAY   budesonide (ENTOCORT EC) 3 MG 24 hr capsule Take 3 mg by mouth 3 (three) times daily.   cholecalciferol (VITAMIN D3) 25 MCG (1000 UNIT) tablet Take 1,000 Units by mouth in the morning.   clopidogrel (PLAVIX) 75 MG tablet TAKE 1 TABLET BY MOUTH EVERY DAY   diphenhydrAMINE (BENADRYL) 25 mg capsule Take 25 mg by mouth at bedtime as needed for sleep (for sleep).   ELIQUIS 5 MG TABS tablet TAKE 1 TABLET BY MOUTH TWICE A DAY   gabapentin (NEURONTIN) 100 MG capsule Take 2 capsules (200 mg total) by mouth 3 (three) times daily.   guaiFENesin (MUCINEX) 600 MG 12 hr tablet Take 1 tablet (600 mg total) by mouth 2 (two) times daily as needed. (Patient taking differently: Take 600 mg by mouth 2 (two)  times daily as needed for cough or to loosen phlegm.)   HYDROcodone-acetaminophen (NORCO) 7.5-325 MG tablet Take 1 tablet by mouth every 4 (four) hours as needed.   hyoscyamine (LEVSIN, ANASPAZ) 0.125 MG tablet Take 0.125 mg by mouth every 4 (four) hours as needed for cramping.    metoprolol succinate (TOPROL-XL) 50 MG 24 hr tablet Take 0.5 tablets (25 mg total) by mouth daily.  Take with or immediately following a meal.   nitroGLYCERIN (NITROSTAT) 0.4 MG SL tablet Place 0.4 mg under the tongue every 5 (five) minutes as needed for chest pain.   pantoprazole (PROTONIX) 40 MG tablet TAKE 1 TABLET BY MOUTH EVERY DAY BEFORE BREAKFAST   PARoxetine (PAXIL) 40 MG tablet Take 40 mg by mouth in the morning.   Pirfenidone 801 MG TABS Take 1 tablet by mouth 3 (three) times daily with meals.   predniSONE (DELTASONE) 10 MG tablet Take 40mg  daily for 1 month then reduce 10mg  monthly   Probiotic Product (PROBIOTIC PO) Take 1 capsule by mouth daily.   sodium chloride (OCEAN) 0.65 % SOLN nasal spray Place 1 spray into both nostrils daily as needed for congestion.   sulfamethoxazole-trimethoprim (BACTRIM DS) 800-160 MG tablet Take 1 tablet by mouth 3 (three) times a week.   tamsulosin (FLOMAX) 0.4 MG CAPS capsule Take 0.4 mg by mouth 2 (two) times daily.   No facility-administered encounter medications on file as of 12/20/2021.    Physical Exam: Blood pressure 112/64, pulse 62, temperature (!) 97.4 F (36.3 C), temperature source Oral, height 6' (1.829 m), weight 221 lb (100.2 kg), SpO2 94 %. Gen:      No acute distress HEENT:  EOMI, sclera anicteric Neck:     No masses; no thyromegaly Lungs:    Bibasal crackles CV:         Regular rate and rhythm; no murmurs Abd:      + bowel sounds; soft, non-tender; no palpable masses, no distension Ext:    No edema; adequate peripheral perfusion Skin:      Warm and dry; no rash Neuro: alert and oriented x 3 Psych: normal mood and affect   Data Reviewed: Imaging: CT chest 07/18/2013-interstitial fibrosis with traction bronchiectasis with basilar predominance, emphysema.  Probable UIP pattern CT abdomen pelvis 02/07/17- basal pulmonary fibrosis. CT high-res 12/18/2020-new spiculated lung nodule in the right upper lobe PET scan 01/05/2021-uptake in the right upper lobe, equivocal uptake in the right hilum.  Left sphenoid sinus disease. Chest  x-ray 04/01/2021-dense right lower lobe infiltrate CTA 04/13/2021-no PE, large right pneumothorax CTA 05/18/2021-left-sided pulmonary embolism, worsening groundglass opacities, airspace disease-right greater than left. High-resolution CT 07/13/21 - resolution of consolidation and asbestos disease. Worsening pattern of pulmonary fibrosis in UIP pattern CT chest 09/17/2021-stable pattern of pulmonary fibrosis I have reviewed the images personally  PFTs: 11/27/2020 FVC 4.03 [85%], FEV1 2.95 [84%], F/F 73, TLC 6.09 [81%], DLCO 20.01 [73%] Minimal diffusion defect.  Labs: CTD serologies 11/26/2020-P ANCA 1:80  Cardiac: Cardiac cath 10/05/2020-normal left and right heart filling pressures.  Single-vessel coronary artery disease  Echocardiogram 05/19/2021- LVEF 83-41%, RV systolic function is normal with mild enlargement.  TR is signal is inadequate for measuring PA pressure.  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 Elevation in ANCA is likely nonspecific as he has no signs of connective tissue disease.  I reviewed his biopsy with Dr. 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  So far is tolerating it well Check labs for monitoring Recent labs earlier this month are stable. Finished pulmonary rehab  Lower extremity edema Has intermittent lower extremity edema and is on diuretics Echo and cardiac cath within the past year reviewed with no significant pulmonary hypertension.  Continue monitoring Check proBNP  Recurrent right pneumothorax Resolved on repeat imaging  Right upper lobe lung adenocarcinoma S/p bronchoscopy and lobectomy Continue surveillance CT scan  Submassive PE Continue Eliquis  Plan/Recommendations: Continue Esbriet Labs for monitoring. Pulmonary rehab  Marshell Garfinkel MD [facility] 12/20/2021, 10:18 AM  CC: Seward Carol, MD

## 2021-12-21 LAB — PRO B NATRIURETIC PEPTIDE: NT-Pro BNP: 430 pg/mL — ABNORMAL HIGH (ref 0–376)

## 2021-12-23 ENCOUNTER — Other Ambulatory Visit (HOSPITAL_COMMUNITY): Payer: Self-pay

## 2021-12-25 ENCOUNTER — Other Ambulatory Visit: Payer: Self-pay | Admitting: Interventional Cardiology

## 2021-12-27 ENCOUNTER — Other Ambulatory Visit (HOSPITAL_COMMUNITY): Payer: Self-pay

## 2021-12-30 ENCOUNTER — Other Ambulatory Visit (HOSPITAL_COMMUNITY): Payer: Self-pay

## 2022-01-18 ENCOUNTER — Other Ambulatory Visit (HOSPITAL_COMMUNITY): Payer: Self-pay

## 2022-01-27 ENCOUNTER — Other Ambulatory Visit: Payer: Self-pay

## 2022-01-27 MED ORDER — NITROGLYCERIN 0.4 MG SL SUBL: 0.4000 mg | SUBLINGUAL_TABLET | SUBLINGUAL | 8 refills | Status: AC | PRN

## 2022-01-27 NOTE — Telephone Encounter (Signed)
Pt's medication was sent to pt's pharmacy as requested. Confirmation received.  °

## 2022-02-10 ENCOUNTER — Other Ambulatory Visit (HOSPITAL_COMMUNITY): Payer: Self-pay

## 2022-02-10 ENCOUNTER — Other Ambulatory Visit: Payer: Self-pay | Admitting: Pulmonary Disease

## 2022-02-10 DIAGNOSIS — J849 Interstitial pulmonary disease, unspecified: Secondary | ICD-10-CM

## 2022-02-10 MED ORDER — PIRFENIDONE 801 MG PO TABS
1.0000 | ORAL_TABLET | Freq: Three times a day (TID) | ORAL | 0 refills | Status: DC
  Filled 2022-02-10: qty 90, 30d supply, fill #0

## 2022-02-10 NOTE — Telephone Encounter (Signed)
Refill sent for PIRFENIDONE to [facility]: [phone]   Dose: 801 mg three times daily  Last OV: 12/20/21 Provider: Dr Vaughan Browner  Next OV: not yet scheduled but due in Feb 2024  LFTs on 12/20/21 wnl  Knox Saliva, PharmD, MPH, BCPS Clinical Pharmacist (Rheumatology and Pulmonology)

## 2022-02-11 ENCOUNTER — Other Ambulatory Visit: Payer: Self-pay

## 2022-02-14 ENCOUNTER — Other Ambulatory Visit: Payer: Self-pay

## 2022-02-15 ENCOUNTER — Other Ambulatory Visit (HOSPITAL_COMMUNITY): Payer: Self-pay

## 2022-02-17 ENCOUNTER — Other Ambulatory Visit: Payer: Self-pay | Admitting: Pulmonary Disease

## 2022-03-04 ENCOUNTER — Encounter: Payer: Self-pay | Admitting: Pulmonary Disease

## 2022-03-09 ENCOUNTER — Other Ambulatory Visit (HOSPITAL_COMMUNITY): Payer: Self-pay

## 2022-03-11 ENCOUNTER — Other Ambulatory Visit: Payer: Self-pay | Admitting: Pulmonary Disease

## 2022-03-11 ENCOUNTER — Other Ambulatory Visit (HOSPITAL_COMMUNITY): Payer: Self-pay

## 2022-03-11 DIAGNOSIS — J849 Interstitial pulmonary disease, unspecified: Secondary | ICD-10-CM

## 2022-03-11 DIAGNOSIS — Z5181 Encounter for therapeutic drug level monitoring: Secondary | ICD-10-CM

## 2022-03-11 MED ORDER — PIRFENIDONE 801 MG PO TABS
1.0000 | ORAL_TABLET | Freq: Three times a day (TID) | ORAL | 0 refills | Status: DC
  Filled 2022-03-11: qty 90, 30d supply, fill #0

## 2022-03-11 NOTE — Telephone Encounter (Signed)
Refill sent for PIRFENIDONE to [facility]: [phone]   Dose: 801 mg three times daily  Last OV: 12/20/2021 Provider: Dr. Isaiah Serge  Next OV: 04/06/2022  LFTs on 12/20/21 stable. Future order placed for hepatic function panel to be drawn on 04/06/2022.  Chesley Mires, PharmD, MPH, BCPS Clinical Pharmacist (Rheumatology and Pulmonology)

## 2022-03-12 DIAGNOSIS — J9601 Acute respiratory failure with hypoxia: Secondary | ICD-10-CM | POA: Diagnosis not present

## 2022-03-14 ENCOUNTER — Other Ambulatory Visit (HOSPITAL_COMMUNITY): Payer: Self-pay

## 2022-03-16 ENCOUNTER — Other Ambulatory Visit (HOSPITAL_COMMUNITY): Payer: Self-pay

## 2022-03-17 ENCOUNTER — Encounter (HOSPITAL_COMMUNITY): Payer: Self-pay

## 2022-03-17 ENCOUNTER — Other Ambulatory Visit (HOSPITAL_COMMUNITY): Payer: Self-pay

## 2022-03-17 ENCOUNTER — Inpatient Hospital Stay: Payer: PPO | Attending: Internal Medicine

## 2022-03-17 ENCOUNTER — Ambulatory Visit (HOSPITAL_COMMUNITY)
Admission: RE | Admit: 2022-03-17 | Discharge: 2022-03-17 | Disposition: A | Discharge status: Home / Self Care | Payer: PPO | Source: Ambulatory Visit | Attending: Internal Medicine | Admitting: Internal Medicine

## 2022-03-17 DIAGNOSIS — Z9981 Dependence on supplemental oxygen: Secondary | ICD-10-CM | POA: Insufficient documentation

## 2022-03-17 DIAGNOSIS — J479 Bronchiectasis, uncomplicated: Secondary | ICD-10-CM | POA: Diagnosis not present

## 2022-03-17 DIAGNOSIS — C3490 Malignant neoplasm of unspecified part of unspecified bronchus or lung: Secondary | ICD-10-CM

## 2022-03-17 DIAGNOSIS — C3431 Malignant neoplasm of lower lobe, right bronchus or lung: Secondary | ICD-10-CM | POA: Insufficient documentation

## 2022-03-17 DIAGNOSIS — J984 Other disorders of lung: Secondary | ICD-10-CM | POA: Diagnosis not present

## 2022-03-17 DIAGNOSIS — C3411 Malignant neoplasm of upper lobe, right bronchus or lung: Secondary | ICD-10-CM | POA: Insufficient documentation

## 2022-03-17 LAB — CBC WITH DIFFERENTIAL (CANCER CENTER ONLY)
Abs Immature Granulocytes: 0.02 10*3/uL (ref 0.00–0.07)
Basophils Absolute: 0.1 10*3/uL (ref 0.0–0.1)
Basophils Relative: 1 %
Eosinophils Absolute: 0.2 10*3/uL (ref 0.0–0.5)
Eosinophils Relative: 2 %
HCT: 41.4 % (ref 39.0–52.0)
Hemoglobin: 14.2 g/dL (ref 13.0–17.0)
Immature Granulocytes: 0 %
Lymphocytes Relative: 12 %
Lymphs Abs: 0.9 10*3/uL (ref 0.7–4.0)
MCH: 32.1 pg (ref 26.0–34.0)
MCHC: 34.3 g/dL (ref 30.0–36.0)
MCV: 93.5 fL (ref 80.0–100.0)
Monocytes Absolute: 0.8 10*3/uL (ref 0.1–1.0)
Monocytes Relative: 10 %
Neutro Abs: 5.6 10*3/uL (ref 1.7–7.7)
Neutrophils Relative %: 75 %
Platelet Count: 418 10*3/uL — ABNORMAL HIGH (ref 150–400)
RBC: 4.43 MIL/uL (ref 4.22–5.81)
RDW: 13.1 % (ref 11.5–15.5)
WBC Count: 7.4 10*3/uL (ref 4.0–10.5)
nRBC: 0 % (ref 0.0–0.2)

## 2022-03-17 LAB — CMP (CANCER CENTER ONLY)
ALT: 19 U/L (ref 0–44)
AST: 26 U/L (ref 15–41)
Albumin: 4.1 g/dL (ref 3.5–5.0)
Alkaline Phosphatase: 90 U/L (ref 38–126)
Anion gap: 6 (ref 5–15)
BUN: 21 mg/dL (ref 8–23)
CO2: 29 mmol/L (ref 22–32)
Calcium: 9.7 mg/dL (ref 8.9–10.3)
Chloride: 104 mmol/L (ref 98–111)
Creatinine: 0.98 mg/dL (ref 0.61–1.24)
GFR, Estimated: >60 mL/min (ref >60)
Glucose, Bld: 94 mg/dL (ref 70–99)
Potassium: 4.7 mmol/L (ref 3.5–5.1)
Sodium: 139 mmol/L (ref 135–145)
Total Bilirubin: 0.6 mg/dL (ref 0.3–1.2)
Total Protein: 6.6 g/dL (ref 6.5–8.1)

## 2022-03-17 MED ORDER — IOHEXOL 300 MG/ML  SOLN
75.0000 mL | Freq: Once | INTRAMUSCULAR | Status: AC | PRN
  Administered 2022-03-17: 75 mL via INTRAVENOUS

## 2022-03-18 ENCOUNTER — Telehealth: Payer: Self-pay | Admitting: Pharmacist

## 2022-03-18 ENCOUNTER — Other Ambulatory Visit (HOSPITAL_COMMUNITY): Payer: Self-pay

## 2022-03-18 DIAGNOSIS — J849 Interstitial pulmonary disease, unspecified: Secondary | ICD-10-CM

## 2022-03-18 NOTE — Telephone Encounter (Signed)
Received message from [facility] that patient's pirfenidone copay has significantly increase to > $3525 for month supply. Will send rx to [facility] ([facility]) - appears copay is $200 but will try.  Patient's wife will use her email to sign up: [email or web address]. She will call me once she has she made an account. All prescriptions have to have an email included so they know what account to attach it to.  Chesley Mires, PharmD, MPH, BCPS, CPP Clinical Pharmacist (Rheumatology and Pulmonology)

## 2022-03-21 ENCOUNTER — Inpatient Hospital Stay (HOSPITAL_BASED_OUTPATIENT_CLINIC_OR_DEPARTMENT_OTHER): Payer: PPO | Admitting: Internal Medicine

## 2022-03-21 ENCOUNTER — Other Ambulatory Visit: Payer: Self-pay

## 2022-03-21 VITALS — BP 114/71 | HR 65 | Temp 97.5°F | Resp 17 | Wt 216.3 lb

## 2022-03-21 DIAGNOSIS — Z9981 Dependence on supplemental oxygen: Secondary | ICD-10-CM | POA: Diagnosis not present

## 2022-03-21 DIAGNOSIS — C3411 Malignant neoplasm of upper lobe, right bronchus or lung: Secondary | ICD-10-CM | POA: Diagnosis not present

## 2022-03-21 DIAGNOSIS — C3490 Malignant neoplasm of unspecified part of unspecified bronchus or lung: Secondary | ICD-10-CM | POA: Diagnosis not present

## 2022-03-21 DIAGNOSIS — C3431 Malignant neoplasm of lower lobe, right bronchus or lung: Secondary | ICD-10-CM | POA: Diagnosis not present

## 2022-03-21 NOTE — Progress Notes (Signed)
[facility] [phone]   [phone]  OFFICE PROGRESS NOTE  Richard Dills, Richard Parrish 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 Cliffton Asters on March 03, 2021.  CURRENT THERAPY: Observation.  INTERVAL HISTORY: Richard Parrish 72 y.o. male returns to the clinic today for 6 months follow-up visit accompanied by his wife.  The patient is feeling fine today with no concerning complaints except for shortness of breath with exertion but he has significantly improved compared to few months ago.  He now does not use the oxygen at regular basis.  He denied having any current chest pain, cough or hemoptysis.  He has no nausea, vomiting, diarrhea or constipation.  He has no headache or visual changes.  He has no recent weight loss or night sweats.  He is here today for evaluation with repeat CT scan of the chest for restaging of his disease.  MEDICAL HISTORY: Past Medical History:  Diagnosis Date   Anxiety    Arthritis    "lower back" (04/07/2017)   BPH (benign prostatic hypertrophy)    C. difficile diarrhea    Chronic lower back pain    Coronary artery disease 1989   cabg with dvg to rca    Depression    GERD (gastroesophageal reflux disease)    Hyperlipidemia    Hypertension    Insomnia    Myocardial infarction (HCC) 2002   OSA on CPAP    cpap setting of 60 per pt   Osteoarthritis    "hips" (04/07/2017)   PAD (peripheral artery disease) (HCC)    Peripheral vascular disease (HCC)    Pneumonia 1989   S/P CABG   Pulmonary embolism (HCC) 05/18/2021   Pulmonary fibrosis (HCC)    "one time" (04/07/2017)   Spinal stenosis     ALLERGIES:  is  allergic to [employer] tartrate].  MEDICATIONS:  Current Outpatient Medications  Medication Sig Dispense Refill   acetaminophen (TYLENOL) 500 MG tablet Take 1 tablet (500 mg total) by mouth every 6 (six) hours as needed for mild pain.     ALPRAZolam (XANAX) 0.5 MG tablet Take 0.5-1 tablets (0.25-0.5 mg total) by mouth 3 (three) times daily as needed for anxiety or sleep. 5 tablet 0   antiseptic oral rinse (BIOTENE) LIQD 15 mLs by Mouth Rinse route daily as needed for dry mouth.     atorvastatin (LIPITOR) 80 MG tablet TAKE 1 TABLET BY MOUTH EVERY DAY 90 tablet 3   budesonide (ENTOCORT EC) 3 MG 24 hr capsule Take 3 mg by mouth 3 (three) times daily.     cholecalciferol (VITAMIN D3) 25 MCG (1000 UNIT) tablet Take 1,000 Units by mouth in the morning.     clopidogrel (PLAVIX) 75 MG tablet TAKE 1 TABLET BY MOUTH EVERY DAY 30 tablet 11   diphenhydrAMINE (BENADRYL) 25 mg capsule Take 25 mg by mouth at bedtime as needed for sleep (for sleep).     ELIQUIS 5 MG TABS tablet TAKE 1 TABLET BY MOUTH TWICE A DAY 180 tablet 1   gabapentin (NEURONTIN) 100 MG capsule Take 2 capsules (200 mg total) by mouth 3 (three) times daily. 90 capsule 1   guaiFENesin (MUCINEX)  600 MG 12 hr tablet Take 1 tablet (600 mg total) by mouth 2 (two) times daily as needed. (Patient taking differently: Take 600 mg by mouth 2 (two) times daily as needed for cough or to loosen phlegm.)     HYDROcodone-acetaminophen (NORCO) 7.5-325 MG tablet Take 1 tablet by mouth every 4 (four) hours as needed.     hyoscyamine (LEVSIN, ANASPAZ) 0.125 MG tablet Take 0.125 mg by mouth every 4 (four) hours as needed for cramping.      metoprolol succinate (TOPROL-XL) 50 MG 24 hr tablet Take 0.5 tablets (25 mg total) by mouth daily. Take with or immediately following a meal.     nitroGLYCERIN (NITROSTAT) 0.4 MG SL tablet Place 1 tablet (0.4 mg total) under the tongue every 5 (five) minutes as needed for chest pain. 25 tablet 8   pantoprazole (PROTONIX)  40 MG tablet TAKE 1 TABLET BY MOUTH EVERY DAY BEFORE BREAKFAST 90 tablet 3   PARoxetine (PAXIL) 40 MG tablet Take 40 mg by mouth in the morning.  6   Pirfenidone 801 MG TABS Take 1 tablet by mouth 3 (three) times daily with meals. 90 tablet 0   predniSONE (DELTASONE) 10 MG tablet Take 40mg  daily for 1 month then reduce 10mg  monthly 120 tablet 2   Probiotic Product (PROBIOTIC PO) Take 1 capsule by mouth daily.     sodium chloride (OCEAN) 0.65 % SOLN nasal spray Place 1 spray into both nostrils daily as needed for congestion.     sulfamethoxazole-trimethoprim (BACTRIM DS) 800-160 MG tablet Take 1 tablet by mouth 3 (three) times a week. 12 tablet 5   tamsulosin (FLOMAX) 0.4 MG CAPS capsule Take 0.4 mg by mouth 2 (two) times daily.     No current facility-administered medications for this visit.    SURGICAL HISTORY:  Past Surgical History:  Procedure Laterality Date   ABDOMINAL AORTAGRAM N/A 03/15/2011   Procedure: ABDOMINAL AORTAGRAM;  Surgeon: Serafina Mitchell, Richard Parrish;  Location: [facility] CATH LAB;  Service: Cardiovascular;  Laterality: N/A;   ABDOMINAL AORTAGRAM N/A 04/12/2011   Procedure: ABDOMINAL Maxcine Ham;  Surgeon: Serafina Mitchell, Richard Parrish;  Location: [facility] CATH LAB;  Service: Cardiovascular;  Laterality: N/A;   BACK SURGERY     BRONCHIAL BIOPSY  03/03/2021   Procedure: BRONCHIAL BIOPSIES;  Surgeon: Garner Nash, DO;  Location: [facility];  Service: Pulmonary;;   BRONCHIAL BRUSHINGS  03/03/2021   Procedure: BRONCHIAL BRUSHINGS;  Surgeon: Garner Nash, DO;  Location: [facility];  Service: Pulmonary;;   BRONCHIAL NEEDLE ASPIRATION BIOPSY  03/03/2021   Procedure: BRONCHIAL NEEDLE ASPIRATION BIOPSIES;  Surgeon: Garner Nash, DO;  Location: [facility];  Service: Pulmonary;;   CARDIAC CATHETERIZATION  08/1987   CORONARY ANGIOPLASTY WITH STENT PLACEMENT  ~ 2002   [facility], [place]   CORONARY ARTERY BYPASS GRAFT  09/1987   "CABG X1";  [facility]; [place]; "RCA"   FIDUCIAL MARKER  PLACEMENT  03/03/2021   Procedure: FIDUCIAL DYE MARKING;  Surgeon: Garner Nash, DO;  Location: [facility];  Service: Pulmonary;;   INTERCOSTAL NERVE BLOCK  03/03/2021   Procedure: INTERCOSTAL NERVE BLOCK;  Surgeon: Lajuana Matte, Richard Parrish;  Location: [facility];  Service: Thoracic;;   JOINT REPLACEMENT     KNEE ARTHROSCOPY Left    LAPAROSCOPIC CHOLECYSTECTOMY     LYMPH NODE BIOPSY  03/03/2021   Procedure: LYMPH NODE BIOPSY;  Surgeon: Lajuana Matte, Richard Parrish;  Location: [facility];  Service: Thoracic;;   LYMPH NODE DISSECTION  03/03/2021  Procedure: LYMPH NODE DISSECTION;  Surgeon: Corliss Skains, Richard Parrish;  Location: [facility] OR;  Service: Thoracic;;   PERIPHERAL VASCULAR INTERVENTION Left 2016   "put a stent; right branch of left femoral"   POSTERIOR LUMBAR FUSION  2009   lumbar fusion, S L 5 to L 4   RIGHT/LEFT HEART CATH AND CORONARY/GRAFT ANGIOGRAPHY N/A 10/05/2020   Procedure: RIGHT/LEFT HEART CATH AND CORONARY/GRAFT ANGIOGRAPHY;  Surgeon: Yvonne Kendall, Richard Parrish;  Location: [facility] INVASIVE CV LAB;  Service: Cardiovascular;  Laterality: N/A;   SHOULDER ARTHROSCOPY W/ ROTATOR CUFF REPAIR Right 2006   SHOULDER ARTHROSCOPY WITH SUBACROMIAL DECOMPRESSION AND OPEN ROTATOR C Left ~ 1983   TONSILLECTOMY AND ADENOIDECTOMY     TOTAL HIP ARTHROPLASTY Right 2012   TOTAL HIP ARTHROPLASTY Left 04/07/2017   TOTAL HIP ARTHROPLASTY Left 04/07/2017   Procedure: LEFT TOTAL HIP ARTHROPLASTY;  Surgeon: Frederico Hamman, Richard Parrish;  Location: [facility] OR;  Service: Orthopedics;  Laterality: Left;   VIDEO BRONCHOSCOPY WITH RADIAL ENDOBRONCHIAL ULTRASOUND  03/03/2021   Procedure: VIDEO BRONCHOSCOPY WITH RADIAL ENDOBRONCHIAL ULTRASOUND;  Surgeon: Josephine Igo, DO;  Location: [facility];  Service: Pulmonary;;    REVIEW OF SYSTEMS:  A comprehensive review of systems was negative except for: Constitutional: positive for fatigue Respiratory: positive for dyspnea on exertion   PHYSICAL EXAMINATION: General appearance: alert, cooperative,  fatigued, and no distress Head: Normocephalic, without obvious abnormality, atraumatic Neck: no adenopathy, no JVD, supple, symmetrical, trachea midline, and thyroid not enlarged, symmetric, no tenderness/mass/nodules Lymph nodes: Cervical, supraclavicular, and axillary nodes normal. Resp: clear to auscultation bilaterally Back: symmetric, no curvature. ROM normal. No CVA tenderness. Cardio: regular rate and rhythm, S1, S2 normal, no murmur, click, rub or gallop GI: soft, non-tender; bowel sounds normal; no masses,  no organomegaly Extremities: extremities normal, atraumatic, no cyanosis or edema  ECOG PERFORMANCE STATUS: 1 - Symptomatic but completely ambulatory  Blood pressure 114/71, pulse 65, temperature (!) 97.5 F (36.4 C), temperature source Oral, resp. rate 17, weight 216 lb 5 oz (98.1 kg), SpO2 97 %.  LABORATORY DATA: Lab Results  Component Value Date   WBC 7.4 03/17/2022   HGB 14.2 03/17/2022   HCT 41.4 03/17/2022   MCV 93.5 03/17/2022   PLT 418 (H) 03/17/2022      Chemistry      Component Value Date/Time   NA 139 03/17/2022 1107   NA 140 02/19/2020 0820   K 4.7 03/17/2022 1107   CL 104 03/17/2022 1107   CO2 29 03/17/2022 1107   BUN 21 03/17/2022 1107   BUN 14 02/19/2020 0820   CREATININE 0.98 03/17/2022 1107      Component Value Date/Time   CALCIUM 9.7 03/17/2022 1107   ALKPHOS 90 03/17/2022 1107   AST 26 03/17/2022 1107   ALT 19 03/17/2022 1107   BILITOT 0.6 03/17/2022 1107       RADIOGRAPHIC STUDIES: CT Chest W Contrast  Result Date: 03/18/2022 CLINICAL DATA:  Non-small-cell lung cancer. Restaging. * Tracking Code: BO * EXAM: CT CHEST WITH CONTRAST TECHNIQUE: Multidetector CT imaging of the chest was performed during intravenous contrast administration. RADIATION DOSE REDUCTION: This exam was performed according to the departmental dose-optimization program which includes automated exposure control, adjustment of the mA and/or kV according to patient  size and/or use of iterative reconstruction technique. CONTRAST:  87mL OMNIPAQUE IOHEXOL 300 MG/ML  SOLN COMPARISON:  09/17/2021 FINDINGS: Cardiovascular: The heart size is normal. No substantial pericardial effusion. Coronary artery calcification is evident. Mild atherosclerotic calcification is noted in the wall of  the thoracic aorta. Status post CABG. Mediastinum/Nodes: Stable 12 mm short axis low right hilar node on 70/2, likely reactive. Otherwise no mediastinal lymphadenopathy. There is no hilar lymphadenopathy. The esophagus has normal imaging features. There is no axillary lymphadenopathy. Lungs/Pleura: Volume loss again noted right hemithorax with relatively diffuse chronic interstitial opacity in the right lung compatible with scarring. This is superimposed on cylindrical bronchiectasis towards the base. More modest subpleural reticulation and honeycombing is seen in the left lung, basilar predominant. Similar appearance of nodular confluent opacity laterally along the left major fissure (99/5), likely scarring. No new suspicious pulmonary nodule or mass. Chronic pleural effusion versus pleural thickening in the posterior inferior right hemithorax is minimally progressive in the interval. Upper Abdomen: Visualized portion of the upper abdomen is unremarkable. Musculoskeletal: No worrisome lytic or sclerotic osseous abnormality. IMPRESSION: 1. Stable exam. No new or progressive findings to suggest recurrent or metastatic disease in the chest. 2. Similar appearance of chronic interstitial lung disease, right greater than left. Imaging appears compatible with pulmonary fibrosis. 3. Chronic pleural effusion versus pleural thickening in the posterior inferior right hemithorax is minimally progressive in the interval. 4.  Aortic Atherosclerois (ICD10-170.0) Electronically Signed   By: Kennith Center M.D.   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 Cliffton Asters on March 03, 2021. The patient is currently on observation and feeling fine with no concerning complaints except for the mild shortness of breath with exertion. He had repeat CT scan of the chest performed recently.  I personally and independently reviewed the scan and discussed the results with the patient and his wife today. His scan showed no concerning findings for disease recurrence or metastasis. I recommended for him to continue on observation with repeat CT scan of the chest in 6 months. The patient was advised to call immediately if he has any other concerning symptoms in the interval. The patient voices understanding of current disease status and treatment options and is in agreement with the current care plan.  All questions were answered. The patient knows to call the clinic with any problems, questions or concerns. We can certainly see the patient much sooner if necessary.  The total time spent in the appointment was 20 minutes.  Disclaimer: This note was dictated with voice recognition software. Similar sounding words can inadvertently be transcribed and may not be corrected upon review.

## 2022-03-23 DIAGNOSIS — Z79899 Other long term (current) drug therapy: Secondary | ICD-10-CM | POA: Diagnosis not present

## 2022-03-23 DIAGNOSIS — M545 Low back pain, unspecified: Secondary | ICD-10-CM | POA: Diagnosis not present

## 2022-03-23 DIAGNOSIS — Z79891 Long term (current) use of opiate analgesic: Secondary | ICD-10-CM | POA: Diagnosis not present

## 2022-03-23 DIAGNOSIS — M48062 Spinal stenosis, lumbar region with neurogenic claudication: Secondary | ICD-10-CM | POA: Diagnosis not present

## 2022-03-23 DIAGNOSIS — G894 Chronic pain syndrome: Secondary | ICD-10-CM | POA: Diagnosis not present

## 2022-03-23 MED ORDER — PIRFENIDONE 801 MG PO TABS: 1.0000 | ORAL_TABLET | Freq: Three times a day (TID) | ORAL | 2 refills | Status: DC

## 2022-03-23 NOTE — Telephone Encounter (Signed)
Patient's wife, Zada Girt, returned call. She states she's made an account on [facility]. Rx for pirfenidone 801mg  three times daily sent to [facility] with her email in sig and rx notes so pharmacy can link the account.  Knox Saliva, PharmD, MPH, BCPS, CPP Clinical Pharmacist (Rheumatology and Pulmonology)

## 2022-03-31 ENCOUNTER — Other Ambulatory Visit (HOSPITAL_COMMUNITY): Payer: Self-pay

## 2022-04-06 ENCOUNTER — Encounter: Payer: Self-pay | Admitting: Pulmonary Disease

## 2022-04-06 ENCOUNTER — Ambulatory Visit (INDEPENDENT_AMBULATORY_CARE_PROVIDER_SITE_OTHER): Payer: PPO | Admitting: Pulmonary Disease

## 2022-04-06 VITALS — BP 132/70 | HR 72 | Temp 97.6°F | Ht 72.0 in | Wt 218.0 lb

## 2022-04-06 DIAGNOSIS — Z5181 Encounter for therapeutic drug level monitoring: Secondary | ICD-10-CM

## 2022-04-06 DIAGNOSIS — J84112 Idiopathic pulmonary fibrosis: Secondary | ICD-10-CM | POA: Diagnosis not present

## 2022-04-06 NOTE — Progress Notes (Signed)
Richard Parrish    [id number]    Nov 01, 1950  Primary Care Physician:Polite, Jori Moll, MD  Referring Physician: Seward Carol, MD [address] [place],  [place] [address]  Problem list: Follow up for IPF, started Esbriet May 2023 Right upper lobe adenocarcinoma status post bronchoscopy and robotic resection by Drs Valeta Harms and Kipp Brood on 1/4 Recurrent pneumothorax PE  HPI: 72 y.o.  with history of pulmonary fibrosis, GERD, hypertension, hyperlipidemia, pulmonary fibrosis He was diagnosed with unspecified pulmonary fibrosis and was seen in pulmonary clinic in 2015  Developed COVID-19 in March 2021 and reports worsening dyspnea since then.  Complains of chronic dyspnea on exertion, cough.  No fevers or chills  Imaging showed right upper lobe mass and he underwent combined bronchoscopy with Dr. Valeta Harms and subsequently right upper lobe robotic resection by Dr. Kipp Brood on 03/03/2021.  Saw Dr. Julien Nordmann from oncology on 03/24/2021 and no further chemotherapy recommended  Postsurgery he had recurrent issues pneumothorax and mildly exacerbation Treated in Jan with levofloxacin and prednisone. He had needle decompression and chest tube placed with Heimlich valve and mini VAC for pneumothorax.  Hospitalized on 2/26 under the care of cardiothoracic surgery.  Discharged on amoxicillin and additional prednisone taper.  Hospitalized in March 2023 with submassive pulmonary embolism, left lower extremity DVT.  Started on Eliquis.  He was also started on prednisone for ILD exacerbation Started Esbriet in May 2023.  So far is tolerating it well without any problem. He has tapered off prednisone in September 2023 Finished pulmonary rehab in September 2023.  Pets: 3 cats Occupation: Used to work in Librarian, academic at [employer] Exposures: Exposed to [employer] carrier. Smoking history: 30-pack-year smoker.  Quit in 1989.  Currently vapes which helps with his back. Travel  history: No significant recent travel history Relevant family history: No family history of lung disease  Interim history: Continues on Esbriet which he is tolerating it well.  He is able to get drugs at a significant cost from patient assistance now Overall he is doing better in terms of his breathing.   Continues on supplemental oxygen.  Outpatient Encounter Medications as of 04/06/2022  Medication Sig   acetaminophen (TYLENOL) 500 MG tablet Take 1 tablet (500 mg total) by mouth every 6 (six) hours as needed for mild pain.   ALPRAZolam (XANAX) 0.5 MG tablet Take 0.5-1 tablets (0.25-0.5 mg total) by mouth 3 (three) times daily as needed for anxiety or sleep.   antiseptic oral rinse (BIOTENE) LIQD 15 mLs by Mouth Rinse route daily as needed for dry mouth.   atorvastatin (LIPITOR) 80 MG tablet TAKE 1 TABLET BY MOUTH EVERY DAY   budesonide (ENTOCORT EC) 3 MG 24 hr capsule Take 3 mg by mouth 3 (three) times daily.   cholecalciferol (VITAMIN D3) 25 MCG (1000 UNIT) tablet Take 1,000 Units by mouth in the morning.   clopidogrel (PLAVIX) 75 MG tablet TAKE 1 TABLET BY MOUTH EVERY DAY   diphenhydrAMINE (BENADRYL) 25 mg capsule Take 25 mg by mouth at bedtime as needed for sleep (for sleep).   ELIQUIS 5 MG TABS tablet TAKE 1 TABLET BY MOUTH TWICE A DAY   gabapentin (NEURONTIN) 100 MG capsule Take 2 capsules (200 mg total) by mouth 3 (three) times daily.   guaiFENesin (MUCINEX) 600 MG 12 hr tablet Take 1 tablet (600 mg total) by mouth 2 (two) times daily as needed. (Patient taking differently: Take 600 mg by mouth 2 (two) times daily  as needed for cough or to loosen phlegm.)   HYDROcodone-acetaminophen (NORCO) 7.5-325 MG tablet Take 1 tablet by mouth every 4 (four) hours as needed.   hyoscyamine (LEVSIN, ANASPAZ) 0.125 MG tablet Take 0.125 mg by mouth every 4 (four) hours as needed for cramping.    metoprolol succinate (TOPROL-XL) 50 MG 24 hr tablet Take 0.5 tablets (25 mg total) by mouth daily. Take with  or immediately following a meal.   nitroGLYCERIN (NITROSTAT) 0.4 MG SL tablet Place 1 tablet (0.4 mg total) under the tongue every 5 (five) minutes as needed for chest pain.   pantoprazole (PROTONIX) 40 MG tablet TAKE 1 TABLET BY MOUTH EVERY DAY BEFORE BREAKFAST   PARoxetine (PAXIL) 40 MG tablet Take 40 mg by mouth in the morning.   Pirfenidone 801 MG TABS Take 1 tablet (801 mg total) by mouth 3 (three) times daily with meals. ([email or web address])   predniSONE (DELTASONE) 10 MG tablet Take 40mg  daily for 1 month then reduce 10mg  monthly   Probiotic Product (PROBIOTIC PO) Take 1 capsule by mouth daily.   sodium chloride (OCEAN) 0.65 % SOLN nasal spray Place 1 spray into both nostrils daily as needed for congestion.   sulfamethoxazole-trimethoprim (BACTRIM DS) 800-160 MG tablet Take 1 tablet by mouth 3 (three) times a week.   tamsulosin (FLOMAX) 0.4 MG CAPS capsule Take 0.4 mg by mouth 2 (two) times daily.   No facility-administered encounter medications on file as of 04/06/2022.    Physical Exam: Blood pressure 132/70, pulse 72, temperature 97.6 F (36.4 C), temperature source Oral, height 6' (1.829 m), weight 218 lb (98.9 kg), SpO2 94 %. Gen:      No acute distress HEENT:  EOMI, sclera anicteric Neck:     No masses; no thyromegaly Lungs:    Clear to auscultation bilaterally; normal respiratory effort CV:         Regular rate and rhythm; no murmurs Abd:      + bowel sounds; soft, non-tender; no palpable masses, no distension Ext:    No edema; adequate peripheral perfusion Skin:      Warm and dry; no rash Neuro: alert and oriented x 3 Psych: normal mood and affect   Data Reviewed: Imaging: CT chest 07/18/2013-interstitial fibrosis with traction bronchiectasis with basilar predominance, emphysema.  Probable UIP pattern CT abdomen pelvis 02/07/17- basal pulmonary fibrosis. CT high-res 12/18/2020-new spiculated lung nodule in the right upper lobe PET scan 01/05/2021-uptake in the right upper  lobe, equivocal uptake in the right hilum.  Left sphenoid sinus disease. Chest x-ray 04/01/2021-dense right lower lobe infiltrate CTA 04/13/2021-no PE, large right pneumothorax CTA 05/18/2021-left-sided pulmonary embolism, worsening groundglass opacities, airspace disease-right greater than left. High-resolution CT 07/13/21 - resolution of consolidation and asbestos disease. Worsening pattern of pulmonary fibrosis in UIP pattern CT chest 09/17/2021-stable pattern of pulmonary fibrosis CT chest 03/17/2022-stable findings of pulmonary fibrosis. I have reviewed the images personally  PFTs: 11/27/2020 FVC 4.03 [85%], FEV1 2.95 [84%], F/F 73, TLC 6.09 [81%], DLCO 20.01 [73%] Minimal diffusion defect.  Labs: CTD serologies 11/26/2020-P ANCA 1:80  Cardiac: Cardiac cath 10/05/2020-normal left and right heart filling pressures.  Single-vessel coronary artery disease  Echocardiogram 05/19/2021- LVEF 94-85%, RV systolic function is normal with mild enlargement.  TR is signal is inadequate for measuring PA pressure.  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 Elevation in ANCA is likely nonspecific as he has no signs of connective tissue disease.  I reviewed his biopsy with Dr. 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  So far is tolerating it well LFTs in January 2024 are normal He had a CT scan in January 2024 for lung cancer surveillance which showed stable pulmonary fibrosis which is good news  Lower extremity edema Has intermittent lower extremity edema and is on diuretics Echo and cardiac cath within the last year reviewed with no significant pulmonary hypertension.  Continue monitoring  Recurrent right pneumothorax Resolved on repeat imaging  Right upper lobe lung adenocarcinoma S/p bronchoscopy and lobectomy Continue surveillance CT scan  Submassive  PE Continue Eliquis  Plan/Recommendations: Continue Esbriet Continue monitoring labs Exercise at home  Marshell Garfinkel MD [facility] 04/06/2022, 9:49 AM  CC: Seward Carol, MD

## 2022-04-06 NOTE — Patient Instructions (Signed)
I am glad you are doing well with your breathing Continue the Esbriet Your recent labs are normal and CT scan shows stability of lung scarring which is good news Follow-up in 6 months.

## 2022-04-12 DIAGNOSIS — J9601 Acute respiratory failure with hypoxia: Secondary | ICD-10-CM | POA: Diagnosis not present

## 2022-04-20 DIAGNOSIS — M48062 Spinal stenosis, lumbar region with neurogenic claudication: Secondary | ICD-10-CM | POA: Diagnosis not present

## 2022-04-20 DIAGNOSIS — M545 Low back pain, unspecified: Secondary | ICD-10-CM | POA: Diagnosis not present

## 2022-04-24 NOTE — Progress Notes (Deleted)
Office Visit    Patient Name: Richard Parrish Date of Encounter: 04/24/2022  Primary Care Provider:  Seward Carol, MD Primary Cardiologist:  Sinclair Grooms, MD (Inactive) Primary Electrophysiologist: None  Chief Complaint    GEORGIY Parrish is a 72 y.o. 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   Past Medical History    Past Medical History:  Diagnosis Date   Anxiety    Arthritis    "lower back" (04/07/2017)   BPH (benign prostatic hypertrophy)    C. difficile diarrhea    Chronic lower back pain    Coronary artery disease 1989   cabg with dvg to rca    Depression    GERD (gastroesophageal reflux disease)    Hyperlipidemia    Hypertension    Insomnia    Myocardial infarction ([place]) 2002   OSA on CPAP    cpap setting of 60 per pt   Osteoarthritis    "hips" (04/07/2017)   PAD (peripheral artery disease) ([place])    Peripheral vascular disease ([place])    Pneumonia 1989   S/P CABG   Pulmonary embolism ([place]) 05/18/2021   Pulmonary fibrosis ([place])    "one time" (04/07/2017)   Spinal stenosis    Past Surgical History:  Procedure Laterality Date   ABDOMINAL AORTAGRAM N/A 03/15/2011   Procedure: ABDOMINAL Maxcine Ham;  Surgeon: Serafina Mitchell, MD;  Location: [facility] CATH LAB;  Service: Cardiovascular;  Laterality: N/A;   ABDOMINAL AORTAGRAM N/A 04/12/2011   Procedure: ABDOMINAL Maxcine Ham;  Surgeon: Serafina Mitchell, MD;  Location: [facility] CATH LAB;  Service: Cardiovascular;  Laterality: N/A;   BACK SURGERY     BRONCHIAL BIOPSY  03/03/2021   Procedure: BRONCHIAL BIOPSIES;  Surgeon: Garner Nash, DO;  Location: [place] [facility];  Service: Pulmonary;;   BRONCHIAL BRUSHINGS  03/03/2021   Procedure: BRONCHIAL BRUSHINGS;  Surgeon: Garner Nash, DO;  Location: [place];  Service: Pulmonary;;   BRONCHIAL NEEDLE ASPIRATION BIOPSY  03/03/2021    Procedure: BRONCHIAL NEEDLE ASPIRATION BIOPSIES;  Surgeon: Garner Nash, DO;  Location: [place] [facility];  Service: Pulmonary;;   CARDIAC CATHETERIZATION  08/1987   CORONARY ANGIOPLASTY WITH STENT PLACEMENT  ~ 2002   [facility], [place]   CORONARY ARTERY BYPASS GRAFT  09/1987   "CABG X1";  [facility]; [place]; "RCA"   FIDUCIAL MARKER PLACEMENT  03/03/2021   Procedure: FIDUCIAL DYE MARKING;  Surgeon: Garner Nash, DO;  Location: [place] [facility];  Service: Pulmonary;;   INTERCOSTAL NERVE BLOCK  03/03/2021   Procedure: INTERCOSTAL NERVE BLOCK;  Surgeon: Lajuana Matte, MD;  Location: [place];  Service: Thoracic;;   JOINT REPLACEMENT     KNEE ARTHROSCOPY Left    LAPAROSCOPIC CHOLECYSTECTOMY     LYMPH NODE BIOPSY  03/03/2021   Procedure: LYMPH NODE BIOPSY;  Surgeon: Lajuana Matte, MD;  Location: [place];  Service: Thoracic;;   LYMPH NODE DISSECTION  03/03/2021   Procedure: LYMPH NODE DISSECTION;  Surgeon: Lajuana Matte, MD;  Location: [place];  Service: Thoracic;;   PERIPHERAL VASCULAR INTERVENTION Left 2016   "put a stent; right branch of left femoral"   POSTERIOR LUMBAR FUSION  2009   lumbar fusion, S L 5 to L 4   RIGHT/LEFT HEART CATH AND CORONARY/GRAFT ANGIOGRAPHY N/A 10/05/2020   Procedure: RIGHT/LEFT HEART CATH AND CORONARY/GRAFT ANGIOGRAPHY;  Surgeon: Nelva Bush, MD;  Location: [place] CV LAB;  Service: Cardiovascular;  Laterality: N/A;   SHOULDER ARTHROSCOPY W/ ROTATOR CUFF REPAIR Right 2006   SHOULDER ARTHROSCOPY WITH SUBACROMIAL DECOMPRESSION AND OPEN ROTATOR C Left ~ [place]     TOTAL HIP ARTHROPLASTY Right 2012   TOTAL HIP ARTHROPLASTY Left 04/07/2017   TOTAL HIP ARTHROPLASTY Left 04/07/2017   Procedure: LEFT TOTAL HIP ARTHROPLASTY;  Surgeon: Earlie Server, MD;  Location: [place];  Service: Orthopedics;  Laterality: Left;   VIDEO BRONCHOSCOPY WITH RADIAL ENDOBRONCHIAL ULTRASOUND  03/03/2021   Procedure: VIDEO BRONCHOSCOPY  WITH RADIAL ENDOBRONCHIAL ULTRASOUND;  Surgeon: Garner Nash, DO;  Location: [place] [facility];  Service: Pulmonary;;    Allergies  Allergies  Allergen Reactions   Ambien [Zolpidem Tartrate] Other (See Comments)    Bad dreams/Vivid Dreams    History of Present Illness    Richard Parrish is a 71 year old male with the above-mentioned medical history who presents today for follow-up of coronary artery disease.  He has history of coronary artery disease dating back to 2012 when cardiac cath was performed with failed PCI requiring CABG.  Most recent 2D echo showed LVEF and no wall motion abnormalities. He was last seen by Laurann [place], NP in 2022 for hospital follow-up.  Patient and underwent left heart cath for complaint of exertional chest pain and shortness of breath.  Cath revealed single-vessel disease with chronic total occlusion of RCA with patent grafts and normal left and right filling pressures.  Medical therapy was recommended for ongoing treatment. He was admitted on 05/18/2021 with complaint of shortness of breath and was found to have pulmonary embolism with DVT.  CT scan showed submassive PE with right heart strain.  Lower extremity Dopplers were positive for left peroneal vein DVT.  He was treated with anticoagulation and provided O2 on discharge.  Patient had experienced orthostasis and Imdur was discontinued upon discharge.  He was last seen in the ED on 8/9 for complaint of chest discomfort and tightness.  Patient's initial troponins were negative and EKG showed no ischemic findings.  He was discharged with strict precautions initiated.  Mr. Kitchens was seen on 09/2021 for follow-up of ED visit.  He had restarted his Imdur which he was off due to hypotension.  He had no chest pain complaints at that time.  He was advised to increase Toprol-XL 25 mg for increased chest pain.  Chest CT completed 03/17/2022 showing no progression of metastatic disease in the chest with no changes  noted.   Since last being seen in the office patient reports***.  Patient denies chest pain, palpitations, dyspnea, PND, orthopnea, nausea, vomiting, dizziness, syncope, edema, weight gain, or early satiety.     ***Notes:  Home Medications    Current Outpatient Medications  Medication Sig Dispense Refill   acetaminophen (TYLENOL) 500 MG tablet Take 1 tablet (500 mg total) by mouth every 6 (six) hours as needed for mild pain.     ALPRAZolam (XANAX) 0.5 MG tablet Take 0.5-1 tablets (0.25-0.5 mg total) by mouth 3 (three) times daily as needed for anxiety or sleep. 5 tablet 0   antiseptic oral rinse (BIOTENE) LIQD 15 mLs by Mouth Rinse route daily as needed for dry mouth.     atorvastatin (LIPITOR) 80 MG tablet TAKE 1 TABLET BY MOUTH EVERY DAY 90 tablet 3   budesonide (ENTOCORT EC) 3 MG 24 hr capsule Take 3 mg by mouth 3 (three) times daily.  cholecalciferol (VITAMIN D3) 25 MCG (1000 UNIT) tablet Take 1,000 Units by mouth in the morning.     clopidogrel (PLAVIX) 75 MG tablet TAKE 1 TABLET BY MOUTH EVERY DAY 30 tablet 11   diphenhydrAMINE (BENADRYL) 25 mg capsule Take 25 mg by mouth at bedtime as needed for sleep (for sleep).     ELIQUIS 5 MG TABS tablet TAKE 1 TABLET BY MOUTH TWICE A DAY 180 tablet 1   gabapentin (NEURONTIN) 100 MG capsule Take 2 capsules (200 mg total) by mouth 3 (three) times daily. 90 capsule 1   guaiFENesin (MUCINEX) 600 MG 12 hr tablet Take 1 tablet (600 mg total) by mouth 2 (two) times daily as needed. (Patient taking differently: Take 600 mg by mouth 2 (two) times daily as needed for cough or to loosen phlegm.)     HYDROcodone-acetaminophen (NORCO) 7.5-325 MG tablet Take 1 tablet by mouth every 4 (four) hours as needed.     hyoscyamine (LEVSIN, ANASPAZ) 0.125 MG tablet Take 0.125 mg by mouth every 4 (four) hours as needed for cramping.      metoprolol succinate (TOPROL-XL) 50 MG 24 hr tablet Take 0.5 tablets (25 mg total) by mouth daily. Take with or immediately  following a meal.     nitroGLYCERIN (NITROSTAT) 0.4 MG SL tablet Place 1 tablet (0.4 mg total) under the tongue every 5 (five) minutes as needed for chest pain. 25 tablet 8   pantoprazole (PROTONIX) 40 MG tablet TAKE 1 TABLET BY MOUTH EVERY DAY BEFORE BREAKFAST 90 tablet 3   PARoxetine (PAXIL) 40 MG tablet Take 40 mg by mouth in the morning.  6   Pirfenidone 801 MG TABS Take 1 tablet (801 mg total) by mouth 3 (three) times daily with meals. ([email or web address]) 90 tablet 2   predniSONE (DELTASONE) 10 MG tablet Take '40mg'$  daily for 1 month then reduce '10mg'$  monthly 120 tablet 2   Probiotic Product (PROBIOTIC PO) Take 1 capsule by mouth daily.     sodium chloride (OCEAN) 0.65 % SOLN nasal spray Place 1 spray into both nostrils daily as needed for congestion.     sulfamethoxazole-trimethoprim (BACTRIM DS) 800-160 MG tablet Take 1 tablet by mouth 3 (three) times a week. 12 tablet 5   tamsulosin (FLOMAX) 0.4 MG CAPS capsule Take 0.4 mg by mouth 2 (two) times daily.     No current facility-administered medications for this visit.     Review of Systems  Please see the history of present illness.    (+)*** (+)***  All other systems reviewed and are otherwise negative except as noted above.  Physical Exam    Wt Readings from Last 3 Encounters:  04/06/22 218 lb (98.9 kg)  03/21/22 216 lb 5 oz (98.1 kg)  12/20/21 221 lb (100.2 kg)   [id number] were no vitals filed for this visit.,There is no height or weight on file to calculate BMI.  Constitutional:      Appearance: Healthy appearance. Not in distress.  Neck:     Vascular: JVD normal.  Pulmonary:     Effort: Pulmonary effort is normal.     Breath sounds: No wheezing. No rales. Diminished in the bases Cardiovascular:     Normal rate. Regular rhythm. Normal S1. Normal S2.      Murmurs: There is no murmur.  Edema:    Peripheral edema absent.  Abdominal:     Palpations: Abdomen is soft non tender. There is no hepatomegaly.  Skin:     General: Skin is warm and  dry.  Neurological:     General: No focal deficit present.     Mental Status: Alert and oriented to person, place and time.     Cranial Nerves: Cranial nerves are intact.  EKG/LABS/Other Studies Reviewed    ECG personally reviewed by me today - ***  Risk Assessment/Calculations:   {Does this patient have ATRIAL FIBRILLATION?:[phone]}        Lab Results  Component Value Date   WBC 7.4 03/17/2022   HGB 14.2 03/17/2022   HCT 41.4 03/17/2022   MCV 93.5 03/17/2022   PLT 418 (H) 03/17/2022   Lab Results  Component Value Date   CREATININE 0.98 03/17/2022   BUN 21 03/17/2022   NA 139 03/17/2022   K 4.7 03/17/2022   CL 104 03/17/2022   CO2 29 03/17/2022   Lab Results  Component Value Date   ALT 19 03/17/2022   AST 26 03/17/2022   ALKPHOS 90 03/17/2022   BILITOT 0.6 03/17/2022   Lab Results  Component Value Date   CHOL 97 10/04/2020   HDL 35 (L) 10/04/2020   LDLCALC 53 10/04/2020   TRIG 46 10/04/2020   CHOLHDL 2.8 10/04/2020    Lab Results  Component Value Date   HGBA1C 5.3 10/03/2020    Assessment & Plan     1.  Coronary artery disease: -Patient recently seen in the ED with complaint of chest pain off of Imdur.  He was started back on Imdur and has currently been off for 3 weeks. -Today patient states he has been doing well with no recurrence of angina -Continue GDMT with Plavix 75 mg, Imdur 30 mg, Toprol XL 25 mg, and Lipitor 80 mg daily -He was advised to take extra 25 of Toprol-XL if chest pain reoccurs and contact our office if not relieved. -Continue nitroglycerin PRN   2.  HLD: -Continue Lipitor 80 mg as noted above -Patient is currently following a low-sodium heart healthy diet   3.  HTN: -Patient has history of orthostasis and blood pressure today was well controlled at 108/66 -Continue Toprol XL 25 mg   4.  History of pulmonary embolism: -History of DVT and pulmonary embolism patient currently being treated with  Eliquis 5 mg   5.  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 stress test, cath, DCCV, TEE, etc)?   Press F2        :[id number]   Medication Adjustments/Labs and Tests Ordered: Current medicines are reviewed at length with the patient today.  Concerns regarding medicines are outlined above.   Signed, Mable Fill, Marissa Nestle, NP 04/24/2022, 2:08 PM [facility]  Note:  This document was prepared using Dragon voice recognition software and may include unintentional dictation errors.

## 2022-04-25 ENCOUNTER — Ambulatory Visit: Payer: PPO | Admitting: Nurse Practitioner

## 2022-04-25 DIAGNOSIS — C3411 Malignant neoplasm of upper lobe, right bronchus or lung: Secondary | ICD-10-CM

## 2022-04-25 DIAGNOSIS — I10 Essential (primary) hypertension: Secondary | ICD-10-CM

## 2022-04-25 DIAGNOSIS — Z86711 Personal history of pulmonary embolism: Secondary | ICD-10-CM

## 2022-04-25 DIAGNOSIS — I25110 Atherosclerotic heart disease of native coronary artery with unstable angina pectoris: Secondary | ICD-10-CM

## 2022-04-25 DIAGNOSIS — J849 Interstitial pulmonary disease, unspecified: Secondary | ICD-10-CM

## 2022-04-27 ENCOUNTER — Other Ambulatory Visit: Payer: Self-pay

## 2022-04-27 MED ORDER — METOPROLOL SUCCINATE ER 50 MG PO TB24: 25.0000 mg | ORAL_TABLET | Freq: Every day | ORAL | 1 refills | Status: DC

## 2022-05-11 DIAGNOSIS — J9601 Acute respiratory failure with hypoxia: Secondary | ICD-10-CM | POA: Diagnosis not present

## 2022-05-18 DIAGNOSIS — J9612 Chronic respiratory failure with hypercapnia: Secondary | ICD-10-CM | POA: Diagnosis not present

## 2022-05-18 DIAGNOSIS — I25119 Atherosclerotic heart disease of native coronary artery with unspecified angina pectoris: Secondary | ICD-10-CM | POA: Diagnosis not present

## 2022-05-18 DIAGNOSIS — M545 Low back pain, unspecified: Secondary | ICD-10-CM | POA: Diagnosis not present

## 2022-05-18 DIAGNOSIS — F322 Major depressive disorder, single episode, severe without psychotic features: Secondary | ICD-10-CM | POA: Diagnosis not present

## 2022-05-18 DIAGNOSIS — Z86711 Personal history of pulmonary embolism: Secondary | ICD-10-CM | POA: Diagnosis not present

## 2022-05-18 DIAGNOSIS — I739 Peripheral vascular disease, unspecified: Secondary | ICD-10-CM | POA: Diagnosis not present

## 2022-05-18 DIAGNOSIS — D6869 Other thrombophilia: Secondary | ICD-10-CM | POA: Diagnosis not present

## 2022-05-18 DIAGNOSIS — Z9981 Dependence on supplemental oxygen: Secondary | ICD-10-CM | POA: Diagnosis not present

## 2022-05-18 DIAGNOSIS — J9611 Chronic respiratory failure with hypoxia: Secondary | ICD-10-CM | POA: Diagnosis not present

## 2022-05-18 DIAGNOSIS — Z6828 Body mass index (BMI) 28.0-28.9, adult: Secondary | ICD-10-CM | POA: Diagnosis not present

## 2022-05-18 DIAGNOSIS — I270 Primary pulmonary hypertension: Secondary | ICD-10-CM | POA: Diagnosis not present

## 2022-05-18 DIAGNOSIS — J8410 Pulmonary fibrosis, unspecified: Secondary | ICD-10-CM | POA: Diagnosis not present

## 2022-05-18 DIAGNOSIS — I10 Essential (primary) hypertension: Secondary | ICD-10-CM | POA: Diagnosis not present

## 2022-05-18 DIAGNOSIS — M48062 Spinal stenosis, lumbar region with neurogenic claudication: Secondary | ICD-10-CM | POA: Diagnosis not present

## 2022-06-09 NOTE — Progress Notes (Signed)
Office Visit    Patient Name: Richard Parrish Date of Encounter: 06/09/2022  Primary Care Provider:  [name], MD Primary Cardiologist:  Lesleigh Noe, MD (Inactive) Primary Electrophysiologist: None  Chief Complaint    Richard Parrish is a 72 y.o. male with PMH of CAD s/p CABG, GERD, HTN, HLD, OSA (on CPAP), PAD, AAA, ILD who presents today for follow-up of coronary artery disease.   Past Medical History    Past Medical History:  Diagnosis Date   Anxiety    Arthritis    "lower back" (04/07/2017)   BPH (benign prostatic hypertrophy)    C. difficile diarrhea    Chronic lower back pain    Coronary artery disease 1989   cabg with dvg to rca    Depression    GERD (gastroesophageal reflux disease)    Hyperlipidemia    Hypertension    Insomnia    Myocardial infarction (HCC) 2002   OSA on CPAP    cpap setting of 60 per pt   Osteoarthritis    "hips" (04/07/2017)   PAD (peripheral artery disease) (HCC)    Peripheral vascular disease (HCC)    Pneumonia 1989   S/P CABG   Pulmonary embolism (HCC) 05/18/2021   Pulmonary fibrosis (HCC)    "one time" (04/07/2017)   Spinal stenosis    Past Surgical History:  Procedure Laterality Date   ABDOMINAL AORTAGRAM N/A 03/15/2011   Procedure: ABDOMINAL Ronny Flurry;  Surgeon: Nada Libman, MD;  Location: [facility] CATH LAB;  Service: Cardiovascular;  Laterality: N/A;   ABDOMINAL AORTAGRAM N/A 04/12/2011   Procedure: ABDOMINAL Ronny Flurry;  Surgeon: Nada Libman, MD;  Location: [facility] CATH LAB;  Service: Cardiovascular;  Laterality: N/A;   BACK SURGERY     BRONCHIAL BIOPSY  03/03/2021   Procedure: BRONCHIAL BIOPSIES;  Surgeon: Josephine Igo, DO;  Location: [facility] ENDOSCOPY;  Service: Pulmonary;;   BRONCHIAL BRUSHINGS  03/03/2021   Procedure: BRONCHIAL BRUSHINGS;  Surgeon: Josephine Igo, DO;  Location: [facility] ENDOSCOPY;  Service: Pulmonary;;   BRONCHIAL NEEDLE ASPIRATION BIOPSY  03/03/2021   Procedure: BRONCHIAL NEEDLE ASPIRATION BIOPSIES;   Surgeon: Josephine Igo, DO;  Location: [facility] ENDOSCOPY;  Service: Pulmonary;;   CARDIAC CATHETERIZATION  08/1987   CORONARY ANGIOPLASTY WITH STENT PLACEMENT  ~ 2002   [facility], [place]   CORONARY ARTERY BYPASS GRAFT  09/1987   "CABG X1";  [facility] [place]; "RCA"   FIDUCIAL MARKER PLACEMENT  03/03/2021   Procedure: FIDUCIAL DYE MARKING;  Surgeon: Josephine Igo, DO;  Location: [facility] ENDOSCOPY;  Service: Pulmonary;;   INTERCOSTAL NERVE BLOCK  03/03/2021   Procedure: INTERCOSTAL NERVE BLOCK;  Surgeon: Corliss Skains, MD;  Location: [facility] OR;  Service: Thoracic;;   JOINT REPLACEMENT     KNEE ARTHROSCOPY Left    LAPAROSCOPIC CHOLECYSTECTOMY     LYMPH NODE BIOPSY  03/03/2021   Procedure: LYMPH NODE BIOPSY;  Surgeon: Corliss Skains, MD;  Location: [facility] OR;  Service: Thoracic;;   LYMPH NODE DISSECTION  03/03/2021   Procedure: LYMPH NODE DISSECTION;  Surgeon: Corliss Skains, MD;  Location: [facility] OR;  Service: Thoracic;;   PERIPHERAL VASCULAR INTERVENTION Left 2016   "put a stent; right branch of left femoral"   POSTERIOR LUMBAR FUSION  2009   lumbar fusion, S L 5 to L 4   RIGHT/LEFT HEART CATH AND CORONARY/GRAFT ANGIOGRAPHY N/A 10/05/2020   Procedure: RIGHT/LEFT HEART CATH AND CORONARY/GRAFT ANGIOGRAPHY;  Surgeon: Yvonne Kendall, MD;  Location: [facility] INVASIVE CV LAB;  Service: Cardiovascular;  Laterality: N/A;   SHOULDER ARTHROSCOPY W/ ROTATOR CUFF REPAIR Right 2006   SHOULDER ARTHROSCOPY WITH SUBACROMIAL DECOMPRESSION AND OPEN ROTATOR C Left ~ 1983   TONSILLECTOMY AND ADENOIDECTOMY     TOTAL HIP ARTHROPLASTY Right 2012   TOTAL HIP ARTHROPLASTY Left 04/07/2017   TOTAL HIP ARTHROPLASTY Left 04/07/2017   Procedure: LEFT TOTAL HIP ARTHROPLASTY;  Surgeon: Frederico Hamman, MD;  Location: [facility] OR;  Service: Orthopedics;  Laterality: Left;   VIDEO BRONCHOSCOPY WITH RADIAL ENDOBRONCHIAL ULTRASOUND  03/03/2021   Procedure: VIDEO BRONCHOSCOPY WITH RADIAL ENDOBRONCHIAL ULTRASOUND;  Surgeon:  Josephine Igo, DO;  Location: [facility]OS[facility];  Service: Pulmonary;;    Allergies  Allergies  Allergen Reactions   Ambien [Zolpidem Tartrate] Other (See Comments)    Bad dreams/Vivid Dreams    History of Present Illness   Richard Parrish is a 72 year old male with the above-mentioned medical history who presents today for follow-up of coronary artery disease.  He has history of coronary artery disease dating back to 2012 when cardiac cath was performed with failed PCI requiring CABG.  Most recent 2D echo showed LVEF and no wall motion abnormalities. He was last seen by Gillian Shields, NP in 2022 for hospital follow-up.  Patient and underwent left heart cath for complaint of exertional chest pain and shortness of breath.  Cath revealed single-vessel disease with chronic total occlusion of RCA with patent grafts and normal left and right filling pressures.  Medical therapy was recommended for ongoing treatment.   He was admitted on 05/18/2021 with complaint of shortness of breath and was found to have pulmonary embolism with DVT.  CT scan showed submassive PE with right heart strain.  Lower extremity Dopplers were positive for left peroneal vein DVT.  He was treated with anticoagulation and provided O2 on discharge.  Patient had experienced orthostasis and Imdur was discontinued upon discharge.  He was last seen in the ED on 8/9 for complaint of chest discomfort and tightness.  Patient's initial troponins were negative and EKG showed no ischemic findings.  He was discharged with strict precautions initiated.  He was seen in follow-up 10/26/2021 and reported doing well with no recurrence of angina since discontinuing Imdur.   Richard Parrish presents today for 5-month follow-up.  Since last being seen in the office patient reports that he has been doing well from a heart perspective and has not experienced any interval chest pain or dizziness.  He he does experience shortness of breath due to pulmonary  fibrosis and is currently being followed by pulmonologist.  He is being more active and is walking more and playing golf regularly.  He is also planning to join a fitness center near his home and [place].  He does endorse some lower leg discomfort due to deconditioning but reports no chest discomfort with activities.  He has been compliant with his medications and denies any adverse reactions.  His blood pressure today was 91/60 however he had taken his blood pressure medications 1 hour prior.  He reports blood pressures in the 130s systolically at home.  Patient denies chest pain, palpitations, dyspnea, PND, orthopnea, nausea, vomiting, dizziness, syncope, edema, weight gain, or early satiety.  Home Medications    Current Outpatient Medications  Medication Sig Dispense Refill   acetaminophen (TYLENOL) 500 MG tablet Take 1 tablet (500 mg total) by mouth every 6 (six) hours as needed for mild pain.     ALPRAZolam (XANAX) 0.5 MG tablet Take  0.5-1 tablets (0.25-0.5 mg total) by mouth 3 (three) times daily as needed for anxiety or sleep. 5 tablet 0   antiseptic oral rinse (BIOTENE) LIQD 15 mLs by Mouth Rinse route daily as needed for dry mouth.     atorvastatin (LIPITOR) 80 MG tablet TAKE 1 TABLET BY MOUTH EVERY DAY 90 tablet 3   budesonide (ENTOCORT EC) 3 MG 24 hr capsule Take 3 mg by mouth 3 (three) times daily.     cholecalciferol (VITAMIN D3) 25 MCG (1000 UNIT) tablet Take 1,000 Units by mouth in the morning.     clopidogrel (PLAVIX) 75 MG tablet TAKE 1 TABLET BY MOUTH EVERY DAY 30 tablet 11   diphenhydrAMINE (BENADRYL) 25 mg capsule Take 25 mg by mouth at bedtime as needed for sleep (for sleep).     ELIQUIS 5 MG TABS tablet TAKE 1 TABLET BY MOUTH TWICE A DAY 180 tablet 1   gabapentin (NEURONTIN) 100 MG capsule Take 2 capsules (200 mg total) by mouth 3 (three) times daily. 90 capsule 1   guaiFENesin (MUCINEX) 600 MG 12 hr tablet Take 1 tablet (600 mg total) by mouth 2 (two) times daily as needed.  (Patient taking differently: Take 600 mg by mouth 2 (two) times daily as needed for cough or to loosen phlegm.)     HYDROcodone-acetaminophen (NORCO) 7.5-325 MG tablet Take 1 tablet by mouth every 4 (four) hours as needed.     hyoscyamine (LEVSIN, ANASPAZ) 0.125 MG tablet Take 0.125 mg by mouth every 4 (four) hours as needed for cramping.      metoprolol succinate (TOPROL-XL) 50 MG 24 hr tablet Take 0.5 tablets (25 mg total) by mouth daily. Take with or immediately following a meal. 45 tablet 1   nitroGLYCERIN (NITROSTAT) 0.4 MG SL tablet Place 1 tablet (0.4 mg total) under the tongue every 5 (five) minutes as needed for chest pain. 25 tablet 8   pantoprazole (PROTONIX) 40 MG tablet TAKE 1 TABLET BY MOUTH EVERY DAY BEFORE BREAKFAST 90 tablet 3   PARoxetine (PAXIL) 40 MG tablet Take 40 mg by mouth in the morning.  6   Pirfenidone 801 MG TABS Take 1 tablet (801 mg total) by mouth 3 (three) times daily with meals. ([email or web address]) 90 tablet 2   predniSONE (DELTASONE) 10 MG tablet Take 40mg  daily for 1 month then reduce 10mg  monthly 120 tablet 2   Probiotic Product (PROBIOTIC PO) Take 1 capsule by mouth daily.     sodium chloride (OCEAN) 0.65 % SOLN nasal spray Place 1 spray into both nostrils daily as needed for congestion.     sulfamethoxazole-trimethoprim (BACTRIM DS) 800-160 MG tablet Take 1 tablet by mouth 3 (three) times a week. 12 tablet 5   tamsulosin (FLOMAX) 0.4 MG CAPS capsule Take 0.4 mg by mouth 2 (two) times daily.     No current facility-administered medications for this visit.     Review of Systems  Please see the history of present illness.    (+) Bilateral leg fatigue chronic shortness of breath  All other systems reviewed and are otherwise negative except as noted above.  Physical Exam    Wt Readings from Last 3 Encounters:  04/06/22 218 lb (98.9 kg)  03/21/22 216 lb 5 oz (98.1 kg)  12/20/21 221 lb (100.2 kg)   ZO:XWRUE were no vitals filed for this visit.,There is no  height or weight on file to calculate BMI.  Constitutional:      Appearance: Healthy appearance. Not in distress.  Neck:  Vascular: JVD normal.  Pulmonary:     Effort: Pulmonary effort is normal.     Breath sounds: No wheezing. No rales. Diminished in the bases Cardiovascular:     Normal rate. Regular rhythm. Normal S1. Normal S2.      Murmurs: There is no murmur.  Edema:    Peripheral edema absent.  Abdominal:     Palpations: Abdomen is soft non tender. There is no hepatomegaly.  Skin:    General: Skin is warm and dry.  Neurological:     General: No focal deficit present.     Mental Status: Alert and oriented to person, place and time.     Cranial Nerves: Cranial nerves are intact.  EKG/LABS/ Recent Cardiac Studies    ECG personally reviewed by me today -none completed today     Lab Results  Component Value Date   WBC 7.4 03/17/2022   HGB 14.2 03/17/2022   HCT 41.4 03/17/2022   MCV 93.5 03/17/2022   PLT 418 (H) 03/17/2022   Lab Results  Component Value Date   CREATININE 0.98 03/17/2022   BUN 21 03/17/2022   NA 139 03/17/2022   K 4.7 03/17/2022   CL 104 03/17/2022   CO2 29 03/17/2022   Lab Results  Component Value Date   ALT 19 03/17/2022   AST 26 03/17/2022   ALKPHOS 90 03/17/2022   BILITOT 0.6 03/17/2022   Lab Results  Component Value Date   CHOL 97 10/04/2020   HDL 35 (L) 10/04/2020   LDLCALC 53 10/04/2020   TRIG 46 10/04/2020   CHOLHDL 2.8 10/04/2020    Lab Results  Component Value Date   HGBA1C 5.3 10/03/2020    Cardiac Studies & Procedures   CARDIAC CATHETERIZATION  CARDIAC CATHETERIZATION 10/05/2020  Narrative Conclusions: Severe single-vessel coronary artery disease with chronic total occlusion of the ostial RCA.  No significant disease noted in the left coronary artery. Patent SVG-RCA with patent mid-graft stent and moderate proximal/mid graft disease that is stable or minimally worse compared to prior catheterization in 2012. Normal  left and right heart filling pressures. Low normal Fick cardiac output/index.  Recommendations: Continue aggressive secondary prevention. Escalate medical therapy and consider further evaluation for alternative causes of shortness of breath and chest pain.  Yvonne Kendall, MD [facility]  Findings Coronary Findings Diagnostic  Dominance: Right  Left Main Vessel is moderate in size. The vessel exhibits minimal luminal irregularities.  Left Anterior Descending Vessel is moderate in size. Vessel is angiographically normal.  First Diagonal Branch Vessel is moderate in size.  Second Diagonal Branch Vessel is small in size.  Third Diagonal Branch Vessel is small in size.  Left Circumflex Vessel is moderate in size. Vessel is angiographically normal.  First Obtuse Marginal Branch Vessel is moderate in size.  Second Obtuse Marginal Branch Vessel is small in size.  Third Obtuse Marginal Branch Vessel is moderate in size.  Right Coronary Artery Vessel is large. Ost RCA to Prox RCA lesion is 100% stenosed. The lesion is chronically occluded.  Right Posterior Descending Artery Vessel is large in size.  Right Posterior Atrioventricular Artery Vessel is large in size.  Saphenous Graft To Dist RCA SVG graft was visualized by angiography and is large. Prox Graft lesion is 60% stenosed. The lesion is eccentric. Mid Graft-1 lesion is 50% stenosed. The lesion is eccentric. Previously placed Mid Graft-2 stent (unknown type) is  widely patent.  Intervention  No interventions have been documented.     ECHOCARDIOGRAM  ECHOCARDIOGRAM  COMPLETE 05/19/2021  Narrative ECHOCARDIOGRAM REPORT    Patient Name:   Richard Parrish Date of Exam: 05/19/2021 Medical Rec #:  [id number]        Height:       72.0 in Accession #:    [id number]       Weight:       208.3 lb Date of Birth:  1950/12/21        BSA:          2.168 m Patient Age:    71 years         BP:           00/00  mmHg Patient Gender: M                HR:           67 bpm. Exam Location:  Inpatient  Procedure: 2D Echo and Intracardiac Opacification Agent  Indications:    Pericardial effusion  History:        Patient has prior history of Echocardiogram examinations, most recent 10/04/2020. CAD; Risk Factors:Hypertension.  Sonographer:    Eduard Roux Referring Phys: [id number] ALLISON WOLFE  IMPRESSIONS   1. Left ventricular ejection fraction, by estimation, is 60 to 65%. The left ventricle has normal function. The left ventricle has no regional wall motion abnormalities. Left ventricular diastolic parameters are indeterminate. 2. Right ventricular systolic function is normal. The right ventricular size is mildly enlarged. Tricuspid regurgitation signal is inadequate for assessing PA pressure. 3. A small pericardial effusion is present. There is no evidence of cardiac tamponade. 4. The mitral valve is grossly normal. No evidence of mitral valve regurgitation. No evidence of mitral stenosis. 5. The aortic valve is tricuspid. Aortic valve regurgitation is not visualized. No aortic stenosis is present.  FINDINGS Left Ventricle: Left ventricular ejection fraction, by estimation, is 60 to 65%. The left ventricle has normal function. The left ventricle has no regional wall motion abnormalities. The left ventricular internal cavity size was normal in size. There is no left ventricular hypertrophy. Left ventricular diastolic parameters are indeterminate.  Right Ventricle: The right ventricular size is mildly enlarged. No increase in right ventricular wall thickness. Right ventricular systolic function is normal. Tricuspid regurgitation signal is inadequate for assessing PA pressure.  Left Atrium: Left atrial size was normal in size.  Right Atrium: Right atrial size was normal in size.  Pericardium: A small pericardial effusion is present. There is no evidence of cardiac tamponade.  Mitral Valve: The  mitral valve is grossly normal. No evidence of mitral valve regurgitation. No evidence of mitral valve stenosis.  Tricuspid Valve: The tricuspid valve is grossly normal. Tricuspid valve regurgitation is not demonstrated. No evidence of tricuspid stenosis.  Aortic Valve: The aortic valve is tricuspid. Aortic valve regurgitation is not visualized. No aortic stenosis is present. Aortic valve peak gradient measures 5.2 mmHg.  Pulmonic Valve: The pulmonic valve was grossly normal. Pulmonic valve regurgitation is not visualized. No evidence of pulmonic stenosis.  Aorta: The aortic root and ascending aorta are structurally normal, with no evidence of dilitation.  Venous: The inferior vena cava was not well visualized.  IAS/Shunts: The atrial septum is grossly normal.   LEFT VENTRICLE PLAX 2D LVIDd:         4.85 cm   Diastology LVIDs:         3.55 cm   LV e' medial:    3.57 cm/s LV PW:         1.50 cm  LV E/e' medial:  16.2 LV IVS:        1.30 cm   LV e' lateral:   5.70 cm/s LVOT diam:     2.10 cm   LV E/e' lateral: 10.1 LV SV:         74 LV SV Index:   34 LVOT Area:     3.46 cm   RIGHT VENTRICLE            IVC RV Basal diam:  2.80 cm    IVC diam: 2.40 cm RV S prime:     9.62 cm/s TAPSE (M-mode): 1.5 cm  LEFT ATRIUM             Index        RIGHT ATRIUM           Index LA diam:        4.60 cm 2.12 cm/m   RA Area:     18.70 cm LA Vol (A2C):   37.5 ml 17.30 ml/m  RA Volume:   46.90 ml  21.63 ml/m LA Vol (A4C):   44.2 ml 20.39 ml/m LA Biplane Vol: 40.8 ml 18.82 ml/m AORTIC VALVE                 PULMONIC VALVE AV Area (Vmax): 3.11 cm     PV Vmax:       0.69 m/s AV Vmax:        113.50 cm/s  PV Peak grad:  1.9 mmHg AV Peak Grad:   5.2 mmHg LVOT Vmax:      102.00 cm/s LVOT Vmean:     59.700 cm/s LVOT VTI:       0.215 m  AORTA Ao Root diam: 3.85 cm Ao Asc diam:  3.30 cm  MITRAL VALVE MV Area (PHT): 3.81 cm    SHUNTS MV Decel Time: 199 msec    Systemic VTI:  0.22 m MV E  velocity: 57.80 cm/s  Systemic Diam: 2.10 cm MV A velocity: 67.70 cm/s MV E/A ratio:  0.85  Lennie Odor MD Electronically signed by Lennie Odor MD Signature Date/Time: 05/19/2021/12:59:14 PM    Final             Assessment & Plan    1.  Coronary artery disease: -s/p stents and CABG with the DVG to RCA -Today patient states he has been doing well with no recurrence of angina -Continue GDMT with Plavix 75 mg,Toprol XL 25 mg, and Lipitor 80 mg daily -He was advised to take extra 25 of Toprol-XL if chest pain reoccurs and contact our office if not relieved. -Continue nitroglycerin PRN   2.  HLD: -Patient last LDL cholesterol was 77 -Continue Lipitor 80 mg as noted above -Patient is currently following a low-sodium heart healthy diet   3.  HTN: -Patient has history of orthostasis and blood pressure today was well controlled at 91/60 however patient recently took his medications 1 hour prior to visit. -Continue Toprol XL 25 mg   4.  History of pulmonary embolism: -History of DVT and pulmonary embolism patient currently being treated with Eliquis 5 mg   5.  Interstitial lung disease: -Patient currently in pulmonary rehab on 5 L nasal cannula chronically -Patient advised to resume pulmonary rehab at this time  Disposition: Follow-up with Dr. Shari Prows as scheduled    Medication Adjustments/Labs and Tests Ordered: Current medicines are reviewed at length with the patient today.  Concerns regarding medicines are outlined above.   Signed, Neila Gear  Sherilyn CooterHenry, NP 06/09/2022, 12:58 PM [facility]  Note:  This document was prepared using Dragon voice recognition software and may include unintentional dictation errors.

## 2022-06-10 ENCOUNTER — Ambulatory Visit: Payer: PPO | Attending: Nurse Practitioner | Admitting: Nurse Practitioner

## 2022-06-10 ENCOUNTER — Encounter: Payer: Self-pay | Admitting: Nurse Practitioner

## 2022-06-10 VITALS — BP 91/60 | HR 72 | Ht 72.0 in | Wt 206.0 lb

## 2022-06-10 DIAGNOSIS — I10 Essential (primary) hypertension: Secondary | ICD-10-CM

## 2022-06-10 DIAGNOSIS — I25110 Atherosclerotic heart disease of native coronary artery with unstable angina pectoris: Secondary | ICD-10-CM

## 2022-06-10 DIAGNOSIS — E7800 Pure hypercholesterolemia, unspecified: Secondary | ICD-10-CM

## 2022-06-10 DIAGNOSIS — I2699 Other pulmonary embolism without acute cor pulmonale: Secondary | ICD-10-CM

## 2022-06-10 DIAGNOSIS — J849 Interstitial pulmonary disease, unspecified: Secondary | ICD-10-CM | POA: Diagnosis not present

## 2022-06-10 NOTE — Patient Instructions (Signed)
Medication Instructions:  Your physician recommends that you continue on your current medications as directed. Please refer to the Current Medication list given to you today.  *If you need a refill on your cardiac medications before your next appointment, please call your pharmacy*   Lab Work: None ordered  If you have labs (blood work) drawn today and your tests are completely normal, you will receive your results only by: MyChart Message (if you have MyChart) OR A paper copy in the mail If you have any lab test that is abnormal or we need to change your treatment, we will call you to review the results.   Testing/Procedures: None ordered   Follow-Up: At [facility], you and your health needs are our priority.  As part of our continuing mission to provide you with exceptional heart care, we have created designated Provider Care Teams.  These Care Teams include your primary Cardiologist (physician) and Advanced Practice Providers (APPs -  Physician Assistants and Nurse Practitioners) who all work together to provide you with the care you need, when you need it.  We recommend signing up for the patient portal called "MyChart".  Sign up information is provided on this After Visit Summary.  MyChart is used to connect with patients for Virtual Visits (Telemedicine).  Patients are able to view lab/test results, encounter notes, upcoming appointments, etc.  Non-urgent messages can be sent to your provider as well.   To learn more about what you can do with MyChart, go to [email or web address].    Your next appointment:   As scheduled    Provider:   Laurance Flatten, MD    Other Instructions

## 2022-06-11 DIAGNOSIS — J84112 Idiopathic pulmonary fibrosis: Secondary | ICD-10-CM | POA: Diagnosis not present

## 2022-06-23 ENCOUNTER — Telehealth: Payer: Self-pay | Admitting: Pulmonary Disease

## 2022-06-23 DIAGNOSIS — J849 Interstitial pulmonary disease, unspecified: Secondary | ICD-10-CM

## 2022-06-23 MED ORDER — PIRFENIDONE 801 MG PO TABS: 1.0000 | ORAL_TABLET | Freq: Three times a day (TID) | ORAL | 2 refills | Status: DC

## 2022-06-23 NOTE — Telephone Encounter (Signed)
Patient called to request a refill for his medication which needs to be ordered, Esbriet (Pirfenidone).  Please advise.

## 2022-06-23 NOTE — Telephone Encounter (Signed)
Refill for pirfenidone sent to [facility]  LFTS 03/17/22 wnl  Chesley Mires, PharmD, MPH, BCPS, CPP Clinical Pharmacist (Rheumatology and Pulmonology)

## 2022-06-27 DIAGNOSIS — J84112 Idiopathic pulmonary fibrosis: Secondary | ICD-10-CM | POA: Diagnosis not present

## 2022-07-11 DIAGNOSIS — J84112 Idiopathic pulmonary fibrosis: Secondary | ICD-10-CM | POA: Diagnosis not present

## 2022-07-19 DIAGNOSIS — F4321 Adjustment disorder with depressed mood: Secondary | ICD-10-CM | POA: Diagnosis not present

## 2022-07-19 DIAGNOSIS — F411 Generalized anxiety disorder: Secondary | ICD-10-CM | POA: Diagnosis not present

## 2022-07-19 DIAGNOSIS — F331 Major depressive disorder, recurrent, moderate: Secondary | ICD-10-CM | POA: Diagnosis not present

## 2022-07-23 ENCOUNTER — Ambulatory Visit
Admission: EM | Admit: 2022-07-23 | Discharge: 2022-07-23 | Disposition: A | Discharge status: Home / Self Care | Payer: PPO

## 2022-07-23 DIAGNOSIS — H5789 Other specified disorders of eye and adnexa: Secondary | ICD-10-CM

## 2022-07-23 NOTE — Discharge Instructions (Signed)
If your symptoms continue or new symptoms develop, seek evaluation at a qualified eye specialist.

## 2022-07-23 NOTE — ED Triage Notes (Signed)
Patient presents to UC for right eye discomfort x 3 days. Debris in eye, has flushed his eye with no improvement.
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
## 2022-07-27 DIAGNOSIS — J84112 Idiopathic pulmonary fibrosis: Secondary | ICD-10-CM | POA: Diagnosis not present

## 2022-08-10 DIAGNOSIS — I25119 Atherosclerotic heart disease of native coronary artery with unspecified angina pectoris: Secondary | ICD-10-CM | POA: Diagnosis not present

## 2022-08-10 DIAGNOSIS — J9611 Chronic respiratory failure with hypoxia: Secondary | ICD-10-CM | POA: Diagnosis not present

## 2022-08-10 DIAGNOSIS — J9612 Chronic respiratory failure with hypercapnia: Secondary | ICD-10-CM | POA: Diagnosis not present

## 2022-08-10 DIAGNOSIS — D6869 Other thrombophilia: Secondary | ICD-10-CM | POA: Diagnosis not present

## 2022-08-10 DIAGNOSIS — J8410 Pulmonary fibrosis, unspecified: Secondary | ICD-10-CM | POA: Diagnosis not present

## 2022-08-10 DIAGNOSIS — R600 Localized edema: Secondary | ICD-10-CM | POA: Diagnosis not present

## 2022-08-10 DIAGNOSIS — I10 Essential (primary) hypertension: Secondary | ICD-10-CM | POA: Diagnosis not present

## 2022-08-11 DIAGNOSIS — H259 Unspecified age-related cataract: Secondary | ICD-10-CM | POA: Diagnosis not present

## 2022-08-12 ENCOUNTER — Other Ambulatory Visit: Payer: Self-pay | Admitting: Pulmonary Disease

## 2022-08-22 DIAGNOSIS — R21 Rash and other nonspecific skin eruption: Secondary | ICD-10-CM | POA: Diagnosis not present

## 2022-08-22 DIAGNOSIS — Z0000 Encounter for general adult medical examination without abnormal findings: Secondary | ICD-10-CM | POA: Diagnosis not present

## 2022-08-22 DIAGNOSIS — M7989 Other specified soft tissue disorders: Secondary | ICD-10-CM | POA: Diagnosis not present

## 2022-08-23 ENCOUNTER — Telehealth: Payer: Self-pay | Admitting: Pulmonary Disease

## 2022-08-23 NOTE — Telephone Encounter (Signed)
PT's wife calling states PT has sun poisoning that has affected his leags with redness and itching and the PCP put him on Lasix and wants him to stop Espriet for a time. Wife wants Dr's permission to do so. Please call and advise @ [phone]

## 2022-08-25 NOTE — Telephone Encounter (Signed)
PT says his primary care Dr. Catalina Pizza him the Espriet was causing his issues charted below. This Dr. told him to stay off the medication for 7 days. He wanted Dr. Isaiah Serge to know and make sure that was OK.   Please call PT or wife to advise.[phone]   Lower legs red and ankles are swollen and he is also on Lasix

## 2022-08-25 NOTE — Telephone Encounter (Signed)
Pt wife calling back pls advise

## 2022-08-25 NOTE — Telephone Encounter (Signed)
Called and spoke with patient's wife. She stated that the patient went to his PCP a few days ago and was told that he had sun poisoning. PCP placed him on Lasix and Kenalog cream and also suggested that he take a break from the Esbriet for a few weeks. Per the wife, he has been doing well on the Esbriet and she was concerned about him stopping the medication so suddenly. She wanted to check before allowing him to do so.   I advised her that since Dr. Isaiah Serge was unavailable, I would send this message to the DOD. I would send the message to MR but he is not in clinic this afternoon.   Dr. Judeth Horn, can you please advise? Thanks!

## 2022-08-25 NOTE — Telephone Encounter (Signed)
Spoke with the pt's spouse and notified of response per Dr. Judeth Horn She verbalized understanding  Nothing further needed

## 2022-08-25 NOTE — Telephone Encounter (Signed)
Esbriet certainly can cause a photosensitivity rash or rash related to sun exposure.  Would be very important in the future that he take great care and wear SPF clothing, shirts and pants, if able.  Wear long sleeve shirts and pants when outdoors even when hot.  Important he wear at least SPF 50 sunblock on exposed skin.  I think is okay to stop the Esbriet for 7 days.  If the rash has improved sooner than 7 days okay to resume once rash is improved.

## 2022-08-26 DIAGNOSIS — M545 Low back pain, unspecified: Secondary | ICD-10-CM | POA: Diagnosis not present

## 2022-08-26 DIAGNOSIS — M48062 Spinal stenosis, lumbar region with neurogenic claudication: Secondary | ICD-10-CM | POA: Diagnosis not present

## 2022-08-27 DIAGNOSIS — J84112 Idiopathic pulmonary fibrosis: Secondary | ICD-10-CM | POA: Diagnosis not present

## 2022-08-29 DIAGNOSIS — R21 Rash and other nonspecific skin eruption: Secondary | ICD-10-CM | POA: Diagnosis not present

## 2022-08-29 DIAGNOSIS — M7989 Other specified soft tissue disorders: Secondary | ICD-10-CM | POA: Diagnosis not present

## 2022-09-07 DIAGNOSIS — J8410 Pulmonary fibrosis, unspecified: Secondary | ICD-10-CM | POA: Diagnosis not present

## 2022-09-07 DIAGNOSIS — K52832 Lymphocytic colitis: Secondary | ICD-10-CM | POA: Diagnosis not present

## 2022-09-07 DIAGNOSIS — Z85118 Personal history of other malignant neoplasm of bronchus and lung: Secondary | ICD-10-CM | POA: Diagnosis not present

## 2022-09-08 ENCOUNTER — Telehealth: Payer: Self-pay | Admitting: Internal Medicine

## 2022-09-12 ENCOUNTER — Inpatient Hospital Stay: Payer: PPO | Attending: Internal Medicine

## 2022-09-12 ENCOUNTER — Ambulatory Visit (HOSPITAL_COMMUNITY)
Admission: RE | Admit: 2022-09-12 | Discharge: 2022-09-12 | Disposition: A | Discharge status: Home / Self Care | Payer: PPO | Source: Ambulatory Visit | Attending: Internal Medicine | Admitting: Internal Medicine

## 2022-09-12 ENCOUNTER — Encounter (HOSPITAL_COMMUNITY): Payer: Self-pay

## 2022-09-12 DIAGNOSIS — C3490 Malignant neoplasm of unspecified part of unspecified bronchus or lung: Secondary | ICD-10-CM | POA: Insufficient documentation

## 2022-09-12 DIAGNOSIS — J90 Pleural effusion, not elsewhere classified: Secondary | ICD-10-CM | POA: Diagnosis not present

## 2022-09-12 DIAGNOSIS — C3431 Malignant neoplasm of lower lobe, right bronchus or lung: Secondary | ICD-10-CM | POA: Insufficient documentation

## 2022-09-12 DIAGNOSIS — J479 Bronchiectasis, uncomplicated: Secondary | ICD-10-CM | POA: Diagnosis not present

## 2022-09-12 DIAGNOSIS — C3411 Malignant neoplasm of upper lobe, right bronchus or lung: Secondary | ICD-10-CM | POA: Insufficient documentation

## 2022-09-12 DIAGNOSIS — I700 Atherosclerosis of aorta: Secondary | ICD-10-CM | POA: Diagnosis not present

## 2022-09-12 LAB — CBC WITH DIFFERENTIAL (CANCER CENTER ONLY)
Abs Immature Granulocytes: 0.02 10*3/uL (ref 0.00–0.07)
Basophils Absolute: 0.1 10*3/uL (ref 0.0–0.1)
Basophils Relative: 1 %
Eosinophils Absolute: 0.3 10*3/uL (ref 0.0–0.5)
Eosinophils Relative: 4 %
HCT: 41.5 % (ref 39.0–52.0)
Hemoglobin: 14.3 g/dL (ref 13.0–17.0)
Immature Granulocytes: 0 %
Lymphocytes Relative: 6 %
Lymphs Abs: 0.4 10*3/uL — ABNORMAL LOW (ref 0.7–4.0)
MCH: 32.6 pg (ref 26.0–34.0)
MCHC: 34.5 g/dL (ref 30.0–36.0)
MCV: 94.7 fL (ref 80.0–100.0)
Monocytes Absolute: 0.8 10*3/uL (ref 0.1–1.0)
Monocytes Relative: 10 %
Neutro Abs: 6.5 10*3/uL (ref 1.7–7.7)
Neutrophils Relative %: 79 %
Platelet Count: 295 10*3/uL (ref 150–400)
RBC: 4.38 MIL/uL (ref 4.22–5.81)
RDW: 13 % (ref 11.5–15.5)
WBC Count: 8.1 10*3/uL (ref 4.0–10.5)
nRBC: 0 % (ref 0.0–0.2)

## 2022-09-12 LAB — CMP (CANCER CENTER ONLY)
ALT: 18 U/L (ref 0–44)
AST: 27 U/L (ref 15–41)
Albumin: 4 g/dL (ref 3.5–5.0)
Alkaline Phosphatase: 87 U/L (ref 38–126)
Anion gap: 6 (ref 5–15)
BUN: 14 mg/dL (ref 8–23)
CO2: 29 mmol/L (ref 22–32)
Calcium: 9.4 mg/dL (ref 8.9–10.3)
Chloride: 104 mmol/L (ref 98–111)
Creatinine: 1.08 mg/dL (ref 0.61–1.24)
GFR, Estimated: >60 mL/min (ref >60)
Glucose, Bld: 83 mg/dL (ref 70–99)
Potassium: 4.1 mmol/L (ref 3.5–5.1)
Sodium: 139 mmol/L (ref 135–145)
Total Bilirubin: 0.5 mg/dL (ref 0.3–1.2)
Total Protein: 6.5 g/dL (ref 6.5–8.1)

## 2022-09-12 MED ORDER — IOHEXOL 300 MG/ML  SOLN
75.0000 mL | Freq: Once | INTRAMUSCULAR | Status: AC | PRN
  Administered 2022-09-12: 75 mL via INTRAVENOUS

## 2022-09-14 DIAGNOSIS — L309 Dermatitis, unspecified: Secondary | ICD-10-CM | POA: Diagnosis not present

## 2022-09-14 DIAGNOSIS — L298 Other pruritus: Secondary | ICD-10-CM | POA: Diagnosis not present

## 2022-09-14 DIAGNOSIS — L439 Lichen planus, unspecified: Secondary | ICD-10-CM | POA: Diagnosis not present

## 2022-09-14 DIAGNOSIS — L560 Drug phototoxic response: Secondary | ICD-10-CM | POA: Diagnosis not present

## 2022-09-15 ENCOUNTER — Other Ambulatory Visit: Payer: PPO

## 2022-09-19 ENCOUNTER — Encounter: Payer: Self-pay | Admitting: Pulmonary Disease

## 2022-09-19 ENCOUNTER — Inpatient Hospital Stay: Payer: PPO | Admitting: Internal Medicine

## 2022-09-19 ENCOUNTER — Other Ambulatory Visit: Payer: Self-pay

## 2022-09-19 ENCOUNTER — Ambulatory Visit: Payer: PPO | Admitting: Pulmonary Disease

## 2022-09-19 VITALS — BP 131/72 | HR 65 | Temp 97.4°F | Resp 17 | Ht 72.0 in | Wt 192.6 lb

## 2022-09-19 VITALS — BP 122/66 | HR 54 | Temp 97.6°F | Ht 72.0 in | Wt 192.8 lb

## 2022-09-19 DIAGNOSIS — Z5181 Encounter for therapeutic drug level monitoring: Secondary | ICD-10-CM | POA: Diagnosis not present

## 2022-09-19 DIAGNOSIS — C3490 Malignant neoplasm of unspecified part of unspecified bronchus or lung: Secondary | ICD-10-CM

## 2022-09-19 DIAGNOSIS — J849 Interstitial pulmonary disease, unspecified: Secondary | ICD-10-CM

## 2022-09-19 DIAGNOSIS — C3431 Malignant neoplasm of lower lobe, right bronchus or lung: Secondary | ICD-10-CM | POA: Diagnosis not present

## 2022-09-19 DIAGNOSIS — J84112 Idiopathic pulmonary fibrosis: Secondary | ICD-10-CM | POA: Diagnosis not present

## 2022-09-19 NOTE — Progress Notes (Signed)
[facility] [phone]   [phone]  OFFICE PROGRESS NOTE  Richard Dills, MD 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 Cliffton Asters on March 03, 2021.  CURRENT THERAPY: Observation.  INTERVAL HISTORY: ELO MARMOLEJOS 72 y.o. male returns to the clinic today for 6 months follow-up visit.  The patient is feeling fine today with no concerning complaints.  His shortness of breath has improved.  He denied having any current chest pain, cough or hemoptysis.  He has no nausea, vomiting, diarrhea or constipation.  He has no headache or visual changes.  He continues to have dermatitis secondary to chronic medications.  He is here today for evaluation with repeat CT scan of the chest for restaging of his disease.  MEDICAL HISTORY: Past Medical History:  Diagnosis Date   Anxiety    Arthritis    "lower back" (04/07/2017)   BPH (benign prostatic hypertrophy)    C. difficile diarrhea    Chronic lower back pain    Coronary artery disease 1989   cabg with dvg to rca    Depression    GERD (gastroesophageal reflux disease)    Hyperlipidemia    Hypertension    Insomnia    Myocardial infarction (HCC) 2002   OSA on CPAP    cpap setting of 60 per pt   Osteoarthritis    "hips" (04/07/2017)   PAD (peripheral artery disease) (HCC)    Peripheral vascular disease (HCC)    Pneumonia 1989   S/P CABG   Pulmonary embolism (HCC) 05/18/2021   Pulmonary fibrosis (HCC)    "one time" (04/07/2017)   Spinal stenosis     ALLERGIES:  is allergic to [employer] tartrate].  MEDICATIONS:  Current Outpatient Medications  Medication Sig Dispense Refill    acetaminophen (TYLENOL) 500 MG tablet Take 1 tablet (500 mg total) by mouth every 6 (six) hours as needed for mild pain.     ALPRAZolam (XANAX) 0.5 MG tablet Take 0.5-1 tablets (0.25-0.5 mg total) by mouth 3 (three) times daily as needed for anxiety or sleep. 5 tablet 0   antiseptic oral rinse (BIOTENE) LIQD 15 mLs by Mouth Rinse route daily as needed for dry mouth.     atorvastatin (LIPITOR) 80 MG tablet TAKE 1 TABLET BY MOUTH EVERY DAY 90 tablet 3   budesonide (ENTOCORT EC) 3 MG 24 hr capsule Take 3 mg by mouth 3 (three) times daily.     cholecalciferol (VITAMIN D3) 25 MCG (1000 UNIT) tablet Take 1,000 Units by mouth in the morning.     diphenhydrAMINE (BENADRYL) 25 mg capsule Take 25 mg by mouth at bedtime as needed for sleep (for sleep).     ELIQUIS 5 MG TABS tablet TAKE 1 TABLET BY MOUTH TWICE A DAY 180 tablet 1   guaiFENesin (MUCINEX) 600 MG 12 hr tablet Take 1 tablet (600 mg total) by mouth 2 (two) times daily as needed.     HYDROcodone-acetaminophen (NORCO) 7.5-325 MG tablet Take 1 tablet by mouth every 4 (four) hours as needed.     hyoscyamine (LEVSIN, ANASPAZ) 0.125 MG tablet Take 0.125 mg by mouth every 4 (four) hours as needed for cramping.  metoprolol succinate (TOPROL-XL) 50 MG 24 hr tablet Take 0.5 tablets (25 mg total) by mouth daily. Take with or immediately following a meal. 45 tablet 1   nitroGLYCERIN (NITROSTAT) 0.4 MG SL tablet Place 1 tablet (0.4 mg total) under the tongue every 5 (five) minutes as needed for chest pain. 25 tablet 8   pantoprazole (PROTONIX) 40 MG tablet TAKE 1 TABLET BY MOUTH EVERY DAY BEFORE BREAKFAST 90 tablet 3   PARoxetine (PAXIL) 40 MG tablet Take 40 mg by mouth in the morning.  6   Pirfenidone 801 MG TABS Take 1 tablet (801 mg total) by mouth 3 (three) times daily with meals. ([email or web address]) 90 tablet 2   Probiotic Product (PROBIOTIC PO) Take 1 capsule by mouth daily.     sodium chloride (OCEAN) 0.65 % SOLN nasal spray Place 1 spray into both  nostrils daily as needed for congestion.     tamsulosin (FLOMAX) 0.4 MG CAPS capsule Take 0.4 mg by mouth 2 (two) times daily.     triamcinolone cream (KENALOG) 0.1 % Apply 1 Application topically 2 (two) times daily.     No current facility-administered medications for this visit.    SURGICAL HISTORY:  Past Surgical History:  Procedure Laterality Date   ABDOMINAL AORTAGRAM N/A 03/15/2011   Procedure: ABDOMINAL AORTAGRAM;  Surgeon: Nada Libman, MD;  Location: [facility] CATH LAB;  Service: Cardiovascular;  Laterality: N/A;   ABDOMINAL AORTAGRAM N/A 04/12/2011   Procedure: ABDOMINAL Ronny Flurry;  Surgeon: Nada Libman, MD;  Location: [facility] CATH LAB;  Service: Cardiovascular;  Laterality: N/A;   BACK SURGERY     BRONCHIAL BIOPSY  03/03/2021   Procedure: BRONCHIAL BIOPSIES;  Surgeon: Josephine Igo, DO;  Location: [facility] ENDOSCOPY;  Service: Pulmonary;;   BRONCHIAL BRUSHINGS  03/03/2021   Procedure: BRONCHIAL BRUSHINGS;  Surgeon: Josephine Igo, DO;  Location: [facility] ENDOS[facility];  Service: Pulmonary;;   BRONCHIAL NEEDLE ASPIRATION BIOPSY  03/03/2021   Procedure: BRONCHIAL NEEDLE ASPIRATION BIOPSIES;  Surgeon: Josephine Igo, DO;  Location: [facility] ENDOSCOPY;  Service: Pulmonary;;   CARDIAC CATHETERIZATION  08/1987   CORONARY ANGIOPLASTY WITH STENT PLACEMENT  ~ 2002   [facility], [place]   CORONARY ARTERY BYPASS GRAFT  09/1987   "CABG X1";  [facility]; [place]; "RCA"   FIDUCIAL MARKER PLACEMENT  03/03/2021   Procedure: FIDUCIAL DYE MARKING;  Surgeon: Josephine Igo, DO;  Location: [facility] ENDOSCOPY;  Service: Pulmonary;;   INTERCOSTAL NERVE BLOCK  03/03/2021   Procedure: INTERCOSTAL NERVE BLOCK;  Surgeon: Corliss Skains, MD;  Location: [facility] OR;  Service: Thoracic;;   JOINT REPLACEMENT     KNEE ARTHROSCOPY Left    LAPAROSCOPIC CHOLECYSTECTOMY     LYMPH NODE BIOPSY  03/03/2021   Procedure: LYMPH NODE BIOPSY;  Surgeon: Corliss Skains, MD;  Location: [facility] OR;  Service: Thoracic;;   LYMPH NODE  DISSECTION  03/03/2021   Procedure: LYMPH NODE DISSECTION;  Surgeon: Corliss Skains, MD;  Location: [facility] OR;  Service: Thoracic;;   PERIPHERAL VASCULAR INTERVENTION Left 2016   "put a stent; right branch of left femoral"   POSTERIOR LUMBAR FUSION  2009   lumbar fusion, S L 5 to L 4   RIGHT/LEFT HEART CATH AND CORONARY/GRAFT ANGIOGRAPHY N/A 10/05/2020   Procedure: RIGHT/LEFT HEART CATH AND CORONARY/GRAFT ANGIOGRAPHY;  Surgeon: Yvonne Kendall, MD;  Location: [facility] INVAS[facility] CV LAB;  Service: Cardiovascular;  Laterality: N/A;   SHOULDER ARTHROSCOPY W/ ROTATOR CUFF REPAIR Right 2006   SHOULDER ARTHROSCOPY WITH SUBACROMIAL DECOMPRESSION  AND OPEN ROTATOR C Left ~ 1983   TONSILLECTOMY AND ADENOIDECTOMY     TOTAL HIP ARTHROPLASTY Right 2012   TOTAL HIP ARTHROPLASTY Left 04/07/2017   TOTAL HIP ARTHROPLASTY Left 04/07/2017   Procedure: LEFT TOTAL HIP ARTHROPLASTY;  Surgeon: Frederico Hamman, MD;  Location: [facility] OR;  Service: Orthopedics;  Laterality: Left;   VIDEO BRONCHOSCOPY WITH RADIAL ENDOBRONCHIAL ULTRASOUND  03/03/2021   Procedure: VIDEO BRONCHOSCOPY WITH RADIAL ENDOBRONCHIAL ULTRASOUND;  Surgeon: Josephine Igo, DO;  Location: [facility];  Service: Pulmonary;;    REVIEW OF SYSTEMS:  A comprehensive review of systems was negative except for: Respiratory: positive for dyspnea on exertion   PHYSICAL EXAMINATION: General appearance: alert, cooperative, and no distress Head: Normocephalic, without obvious abnormality, atraumatic Neck: no adenopathy, no JVD, supple, symmetrical, trachea midline, and thyroid not enlarged, symmetric, no tenderness/mass/nodules Lymph nodes: Cervical, supraclavicular, and axillary nodes normal. Resp: clear to auscultation bilaterally Back: symmetric, no curvature. ROM normal. No CVA tenderness. Cardio: regular rate and rhythm, S1, S2 normal, no murmur, click, rub or gallop GI: soft, non-tender; bowel sounds normal; no masses,  no organomegaly Extremities: extremities  normal, atraumatic, no cyanosis or edema  ECOG PERFORMANCE STATUS: 1 - Symptomatic but completely ambulatory  Blood pressure 131/72, pulse 65, temperature (!) 97.4 F (36.3 C), temperature source Oral, resp. rate 17, height 6' (1.829 m), weight 192 lb 9.6 oz (87.4 kg), SpO2 100%.  LABORATORY DATA: Lab Results  Component Value Date   WBC 8.1 09/12/2022   HGB 14.3 09/12/2022   HCT 41.5 09/12/2022   MCV 94.7 09/12/2022   PLT 295 09/12/2022      Chemistry      Component Value Date/Time   NA 139 09/12/2022 1536   NA 140 02/19/2020 0820   K 4.1 09/12/2022 1536   CL 104 09/12/2022 1536   CO2 29 09/12/2022 1536   BUN 14 09/12/2022 1536   BUN 14 02/19/2020 0820   CREATININE 1.08 09/12/2022 1536      Component Value Date/Time   CALCIUM 9.4 09/12/2022 1536   ALKPHOS 87 09/12/2022 1536   AST 27 09/12/2022 1536   ALT 18 09/12/2022 1536   BILITOT 0.5 09/12/2022 1536       RADIOGRAPHIC STUDIES: CT Chest W Contrast  Result Date: 09/16/2022 CLINICAL DATA:  Non-small-cell lung cancer. Restaging. * Tracking Code: BO * EXAM: CT CHEST WITH CONTRAST TECHNIQUE: Multidetector CT imaging of the chest was performed during intravenous contrast administration. RADIATION DOSE REDUCTION: This exam was performed according to the departmental dose-optimization program which includes automated exposure control, adjustment of the mA and/or kV according to patient size and/or use of iterative reconstruction technique. CONTRAST:  75mL OMNIPAQUE IOHEXOL 300 MG/ML  SOLN COMPARISON:  03/17/2022 FINDINGS: Cardiovascular: The heart size is normal. No substantial pericardial effusion. Coronary artery calcification is evident. Mild atherosclerotic calcification is noted in the wall of the thoracic aorta. Mediastinum/Nodes: Stable 11 mm short axis medial right hilar node on 80/2. Stable 10 mm short axis left hilar node on 90/2. The esophagus has normal imaging features. There is no axillary lymphadenopathy.  Lungs/Pleura: Similar volume loss right hemithorax with surgical changes in the upper right hilum. The right greater than left bilateral and basilar predominant subpleural reticulation with cylindrical bronchiectasis shows no substantial interval change. No new suspicious pulmonary nodule or mass. Similar appearance of nodular opacity in the lateral left upper lobe along the periphery of the major fissure (97/5) compatible with scarring probable chronic pleural thickening posterior right lung base with  interval resolution of the associated right pleural fluid seen previously. Upper Abdomen: Unremarkable. Musculoskeletal: No worrisome lytic or sclerotic osseous abnormality. IMPRESSION: 1. Stable exam. No new or progressive findings to suggest recurrent or metastatic disease. 2. Stable right greater than left bilateral and basilar predominant subpleural reticulation with cylindrical bronchiectasis. Imaging features compatible with interstitial lung disease/pulmonary fibrosis. 3. Tiny right pleural fluid collection seen previously has resolved in the interval 4. Stable upper normal hilar lymph nodes. 5.  Aortic Atherosclerosis (ICD10-I70.0). Electronically Signed   By: Kennith Center M.D.   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 Cliffton Asters on March 03, 2021. The patient is currently on observation and he is feeling fine with no concerning complaints. He had repeat CT scan of the chest performed recently.  I personally and independently reviewed the scan and discussed the result with the patient and his wife. His scan showed no concerning findings for disease recurrence or  metastasis. I recommended for him to continue on observation with repeat CT scan of the chest in 6 months. The patient was advised to call immediately if he has any other concerning symptoms in the interval. The patient voices understanding of current disease status and treatment options and is in agreement with the current care plan.  All questions were answered. The patient knows to call the clinic with any problems, questions or concerns. We can certainly see the patient much sooner if necessary.  The total time spent in the appointment was 20 minutes.  Disclaimer: This note was dictated with voice recognition software. Similar sounding words can inadvertently be transcribed and may not be corrected upon review.

## 2022-09-19 NOTE — Patient Instructions (Signed)
His CT looks stable which is good news Continue the Esbriet and monitor the sunburn Please wear SPF and protective clothing while out in the sun Follow-up in 3 months

## 2022-09-19 NOTE — Progress Notes (Signed)
[facility] [phone]   [phone]  OFFICE PROGRESS NOTE  [name], MD 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 Cliffton Asters on March 03, 2021.  CURRENT THERAPY: Observation.  INTERVAL HISTORY: Richard Parrish 72 y.o. male returns to the clinic today for 6 months follow-up visit accompanied by his wife.  The patient is feeling fine today with no concerning complaints except for shortness of breath with exertion but he has significantly improved compared to few months ago.  He now does not use the oxygen at regular basis.  He denied having any current chest pain, cough or hemoptysis.  He has no nausea, vomiting, diarrhea or constipation.  He has no headache or visual changes.  He has no recent weight loss or night sweats.  He is here today for evaluation with repeat CT scan of the chest for restaging of his disease.  MEDICAL HISTORY: Past Medical History:  Diagnosis Date   Anxiety    Arthritis    "lower back" (04/07/2017)   BPH (benign prostatic hypertrophy)    C. difficile diarrhea    Chronic lower back pain    Coronary artery disease 1989   cabg with dvg to rca    Depression    GERD (gastroesophageal reflux disease)    Hyperlipidemia    Hypertension    Insomnia    Myocardial infarction (HCC) 2002   OSA on CPAP    cpap setting of 60 per pt   Osteoarthritis    "hips" (04/07/2017)   PAD (peripheral artery disease) (HCC)    Peripheral vascular disease (HCC)    Pneumonia 1989   S/P CABG   Pulmonary embolism (HCC) 05/18/2021   Pulmonary fibrosis (HCC)    "one time" (04/07/2017)   Spinal stenosis     ALLERGIES:  is  allergic to [employer] tartrate].  MEDICATIONS:  Current Outpatient Medications  Medication Sig Dispense Refill   acetaminophen (TYLENOL) 500 MG tablet Take 1 tablet (500 mg total) by mouth every 6 (six) hours as needed for mild pain.     ALPRAZolam (XANAX) 0.5 MG tablet Take 0.5-1 tablets (0.25-0.5 mg total) by mouth 3 (three) times daily as needed for anxiety or sleep. 5 tablet 0   antiseptic oral rinse (BIOTENE) LIQD 15 mLs by Mouth Rinse route daily as needed for dry mouth.     atorvastatin (LIPITOR) 80 MG tablet TAKE 1 TABLET BY MOUTH EVERY DAY 90 tablet 3   budesonide (ENTOCORT EC) 3 MG 24 hr capsule Take 3 mg by mouth 3 (three) times daily.     cholecalciferol (VITAMIN D3) 25 MCG (1000 UNIT) tablet Take 1,000 Units by mouth in the morning.     diphenhydrAMINE (BENADRYL) 25 mg capsule Take 25 mg by mouth at bedtime as needed for sleep (for sleep).     ELIQUIS 5 MG TABS tablet TAKE 1 TABLET BY MOUTH TWICE A DAY 180 tablet 1   guaiFENesin (MUCINEX) 600 MG 12 hr tablet Take 1 tablet (600 mg total) by mouth 2 (two) times daily as needed.     HYDROcodone-acetaminophen (NORCO) 7.5-325 MG tablet Take 1 tablet by mouth every 4 (four) hours as needed.  hyoscyamine (LEVSIN, ANASPAZ) 0.125 MG tablet Take 0.125 mg by mouth every 4 (four) hours as needed for cramping.      metoprolol succinate (TOPROL-XL) 50 MG 24 hr tablet Take 0.5 tablets (25 mg total) by mouth daily. Take with or immediately following a meal. 45 tablet 1   nitroGLYCERIN (NITROSTAT) 0.4 MG SL tablet Place 1 tablet (0.4 mg total) under the tongue every 5 (five) minutes as needed for chest pain. 25 tablet 8   pantoprazole (PROTONIX) 40 MG tablet TAKE 1 TABLET BY MOUTH EVERY DAY BEFORE BREAKFAST 90 tablet 3   PARoxetine (PAXIL) 40 MG tablet Take 40 mg by mouth in the morning.  6   Pirfenidone 801 MG TABS Take 1 tablet (801 mg total) by mouth 3 (three) times daily with meals. ([email or web address]) 90 tablet 2   Probiotic Product  (PROBIOTIC PO) Take 1 capsule by mouth daily.     sodium chloride (OCEAN) 0.65 % SOLN nasal spray Place 1 spray into both nostrils daily as needed for congestion.     tamsulosin (FLOMAX) 0.4 MG CAPS capsule Take 0.4 mg by mouth 2 (two) times daily.     triamcinolone cream (KENALOG) 0.1 % Apply 1 Application topically 2 (two) times daily.     No current facility-administered medications for this visit.    SURGICAL HISTORY:  Past Surgical History:  Procedure Laterality Date   ABDOMINAL AORTAGRAM N/A 03/15/2011   Procedure: ABDOMINAL AORTAGRAM;  Surgeon: Nada Libman, MD;  Location: [facility] CATH LAB;  Service: Cardiovascular;  Laterality: N/A;   ABDOMINAL AORTAGRAM N/A 04/12/2011   Procedure: ABDOMINAL Ronny Flurry;  Surgeon: Nada Libman, MD;  Location: [facility] CATH LAB;  Service: Cardiovascular;  Laterality: N/A;   BACK SURGERY     BRONCHIAL BIOPSY  03/03/2021   Procedure: BRONCHIAL BIOPSIES;  Surgeon: Josephine Igo, DO;  Location: [facility] ENDOSCOPY;  Service: Pulmonary;;   BRONCHIAL BRUSHINGS  03/03/2021   Procedure: BRONCHIAL BRUSHINGS;  Surgeon: Josephine Igo, DO;  Location: [facility] ENDOSCOPY;  Service: Pulmonary;;   BRONCHIAL NEEDLE ASPIRATION BIOPSY  03/03/2021   Procedure: BRONCHIAL NEEDLE ASPIRATION BIOPSIES;  Surgeon: Josephine Igo, DO;  Location: [facility] ENDOSCOPY;  Service: Pulmonary;;   CARDIAC CATHETERIZATION  08/1987   CORONARY ANGIOPLASTY WITH STENT PLACEMENT  ~ 2002   [facility], [place]   CORONARY ARTERY BYPASS GRAFT  09/1987   "CABG X1";  [facility]; [place]; "RCA"   FIDUCIAL MARKER PLACEMENT  03/03/2021   Procedure: FIDUCIAL DYE MARKING;  Surgeon: Josephine Igo, DO;  Location: [facility] ENDOSCOPY;  Service: Pulmonary;;   INTERCOSTAL NERVE BLOCK  03/03/2021   Procedure: INTERCOSTAL NERVE BLOCK;  Surgeon: Corliss Skains, MD;  Location: [facility] OR;  Service: Thoracic;;   JOINT REPLACEMENT     KNEE ARTHROSCOPY Left    LAPAROSCOPIC CHOLECYSTECTOMY     LYMPH NODE BIOPSY  03/03/2021    Procedure: LYMPH NODE BIOPSY;  Surgeon: Corliss Skains, MD;  Location: [facility] OR;  Service: Thoracic;;   LYMPH NODE DISSECTION  03/03/2021   Procedure: LYMPH NODE DISSECTION;  Surgeon: Corliss Skains, MD;  Location: [facility] OR;  Service: Thoracic;;   PERIPHERAL VASCULAR INTERVENTION Left 2016   "put a stent; right branch of left femoral"   POSTERIOR LUMBAR FUSION  2009   lumbar fusion, S L 5 to L 4   RIGHT/LEFT HEART CATH AND CORONARY/GRAFT ANGIOGRAPHY N/A 10/05/2020   Procedure: RIGHT/LEFT HEART CATH AND CORONARY/GRAFT ANGIOGRAPHY;  Surgeon: Yvonne Kendall, MD;  Location: [facility] INVASIVE CV  LAB;  Service: Cardiovascular;  Laterality: N/A;   SHOULDER ARTHROSCOPY W/ ROTATOR CUFF REPAIR Right 2006   SHOULDER ARTHROSCOPY WITH SUBACROMIAL DECOMPRESSION AND OPEN ROTATOR C Left ~ 1983   TONSILLECTOMY AND ADENOIDECTOMY     TOTAL HIP ARTHROPLASTY Right 2012   TOTAL HIP ARTHROPLASTY Left 04/07/2017   TOTAL HIP ARTHROPLASTY Left 04/07/2017   Procedure: LEFT TOTAL HIP ARTHROPLASTY;  Surgeon: Frederico Hamman, MD;  Location: [facility] OR;  Service: Orthopedics;  Laterality: Left;   VIDEO BRONCHOSCOPY WITH RADIAL ENDOBRONCHIAL ULTRASOUND  03/03/2021   Procedure: VIDEO BRONCHOSCOPY WITH RADIAL ENDOBRONCHIAL ULTRASOUND;  Surgeon: Josephine Igo, DO;  Location: [facility] ENDOSCOPY;  Service: Pulmonary;;    REVIEW OF SYSTEMS:  A comprehensive review of systems was negative except for: Constitutional: positive for fatigue Respiratory: positive for dyspnea on exertion   PHYSICAL EXAMINATION: General appearance: alert, cooperative, fatigued, and no distress Head: Normocephalic, without obvious abnormality, atraumatic Neck: no adenopathy, no JVD, supple, symmetrical, trachea midline, and thyroid not enlarged, symmetric, no tenderness/mass/nodules Lymph nodes: Cervical, supraclavicular, and axillary nodes normal. Resp: clear to auscultation bilaterally Back: symmetric, no curvature. ROM normal. No CVA tenderness. Cardio: regular  rate and rhythm, S1, S2 normal, no murmur, click, rub or gallop GI: soft, non-tender; bowel sounds normal; no masses,  no organomegaly Extremities: extremities normal, atraumatic, no cyanosis or edema  ECOG PERFORMANCE STATUS: 1 - Symptomatic but completely ambulatory  Blood pressure 131/72, pulse 65, temperature (!) 97.4 F (36.3 C), temperature source Oral, resp. rate 17, height 6' (1.829 m), weight 192 lb 9.6 oz (87.4 kg), SpO2 100%.  LABORATORY DATA: Lab Results  Component Value Date   WBC 8.1 09/12/2022   HGB 14.3 09/12/2022   HCT 41.5 09/12/2022   MCV 94.7 09/12/2022   PLT 295 09/12/2022      Chemistry      Component Value Date/Time   NA 139 09/12/2022 1536   NA 140 02/19/2020 0820   K 4.1 09/12/2022 1536   CL 104 09/12/2022 1536   CO2 29 09/12/2022 1536   BUN 14 09/12/2022 1536   BUN 14 02/19/2020 0820   CREATININE 1.08 09/12/2022 1536      Component Value Date/Time   CALCIUM 9.4 09/12/2022 1536   ALKPHOS 87 09/12/2022 1536   AST 27 09/12/2022 1536   ALT 18 09/12/2022 1536   BILITOT 0.5 09/12/2022 1536       RADIOGRAPHIC STUDIES: CT Chest W Contrast  Result Date: 09/16/2022 CLINICAL DATA:  Non-small-cell lung cancer. Restaging. * Tracking Code: BO * EXAM: CT CHEST WITH CONTRAST TECHNIQUE: Multidetector CT imaging of the chest was performed during intravenous contrast administration. RADIATION DOSE REDUCTION: This exam was performed according to the departmental dose-optimization program which includes automated exposure control, adjustment of the mA and/or kV according to patient size and/or use of iterative reconstruction technique. CONTRAST:  75mL OMNIPAQUE IOHEXOL 300 MG/ML  SOLN COMPARISON:  03/17/2022 FINDINGS: Cardiovascular: The heart size is normal. No substantial pericardial effusion. Coronary artery calcification is evident. Mild atherosclerotic calcification is noted in the wall of the thoracic aorta. Mediastinum/Nodes: Stable 11 mm short axis medial  right hilar node on 80/2. Stable 10 mm short axis left hilar node on 90/2. The esophagus has normal imaging features. There is no axillary lymphadenopathy. Lungs/Pleura: Similar volume loss right hemithorax with surgical changes in the upper right hilum. The right greater than left bilateral and basilar predominant subpleural reticulation with cylindrical bronchiectasis shows no substantial interval change. No new suspicious pulmonary nodule or mass. Similar appearance  of nodular opacity in the lateral left upper lobe along the periphery of the major fissure (97/5) compatible with scarring probable chronic pleural thickening posterior right lung base with interval resolution of the associated right pleural fluid seen previously. Upper Abdomen: Unremarkable. Musculoskeletal: No worrisome lytic or sclerotic osseous abnormality. IMPRESSION: 1. Stable exam. No new or progressive findings to suggest recurrent or metastatic disease. 2. Stable right greater than left bilateral and basilar predominant subpleural reticulation with cylindrical bronchiectasis. Imaging features compatible with interstitial lung disease/pulmonary fibrosis. 3. Tiny right pleural fluid collection seen previously has resolved in the interval 4. Stable upper normal hilar lymph nodes. 5.  Aortic Atherosclerosis (ICD10-I70.0). Electronically Signed   By: Kennith Center M.D.   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 Cliffton Asters on March 03, 2021. The patient is currently on observation and feeling fine with no concerning complaints except for the mild shortness of breath with exertion. He had repeat CT scan of the  chest performed recently.  I personally and independently reviewed the scan and discussed the results with the patient and his wife today. His scan showed no concerning findings for disease recurrence or metastasis. I recommended for him to continue on observation with repeat CT scan of the chest in 6 months. The patient was advised to call immediately if he has any other concerning symptoms in the interval. The patient voices understanding of current disease status and treatment options and is in agreement with the current care plan.  All questions were answered. The patient knows to call the clinic with any problems, questions or concerns. We can certainly see the patient much sooner if necessary.  The total time spent in the appointment was 20 minutes.  Disclaimer: This note was dictated with voice recognition software. Similar sounding words can inadvertently be transcribed and may not be corrected upon review.

## 2022-09-19 NOTE — Progress Notes (Signed)
Richard Parrish    [id number]    December 23, 1950  Primary Care Physician:Polite, Windy Fast, MD  Referring Physician: [name], MD [address] [place],  [place] [address]  Problem list: Follow up for IPF, started Esbriet May 2023 Right upper lobe adenocarcinoma status post bronchoscopy and robotic resection by Drs Tonia Brooms and Cliffton Asters on 1/4 Recurrent pneumothorax PE  HPI: 72 y.o.  with history of pulmonary fibrosis, GERD, hypertension, hyperlipidemia, pulmonary fibrosis He was diagnosed with unspecified pulmonary fibrosis and was seen in pulmonary clinic in 2015  Developed COVID-19 in March 2021 and reports worsening dyspnea since then.  Complains of chronic dyspnea on exertion, cough.  No fevers or chills  Imaging showed right upper lobe mass and he underwent combined bronchoscopy with Dr. Tonia Brooms and subsequently right upper lobe robotic resection by Dr. Cliffton Asters on 03/03/2021.  Saw Dr. Arbutus Ped from oncology on 03/24/2021 and no further chemotherapy recommended  Postsurgery he had recurrent issues pneumothorax and mildly exacerbation Treated in Jan with levofloxacin and prednisone. He had needle decompression and chest tube placed with Heimlich valve and mini VAC for pneumothorax.  Hospitalized on 2/26 under the care of cardiothoracic surgery.  Discharged on amoxicillin and additional prednisone taper.  Hospitalized in March 2023 with submassive pulmonary embolism, left lower extremity DVT.  Started on Eliquis.  He was also started on prednisone for ILD exacerbation Started Esbriet in May 2023.  So far is tolerating it well without any problem. He has tapered off prednisone in September 2023 Finished pulmonary rehab in September 2023.  Pets: 3 cats Occupation: Used to work in Patent examiner at [employer] Exposures: Exposed to [employer] carrier. Smoking history: 30-pack-year smoker.  Quit in 1989.  Currently vapes which helps with his back. Travel  history: No significant recent travel history Relevant family history: No family history of lung disease  Interim history: Developed photosensitivity rash to [employer].  He took a break from the medication for a week and resumed it in early July.  Overall he feels that the rash is improving and has already consulted with a dermatologist.  States that breathing is doing better.  Continues on supplemental oxygen. He is here for review of CT scan.   Outpatient Encounter Medications as of 09/19/2022  Medication Sig   acetaminophen (TYLENOL) 500 MG tablet Take 1 tablet (500 mg total) by mouth every 6 (six) hours as needed for mild pain.   ALPRAZolam (XANAX) 0.5 MG tablet Take 0.5-1 tablets (0.25-0.5 mg total) by mouth 3 (three) times daily as needed for anxiety or sleep.   antiseptic oral rinse (BIOTENE) LIQD 15 mLs by Mouth Rinse route daily as needed for dry mouth.   atorvastatin (LIPITOR) 80 MG tablet TAKE 1 TABLET BY MOUTH EVERY DAY   budesonide (ENTOCORT EC) 3 MG 24 hr capsule Take 3 mg by mouth 3 (three) times daily.   cholecalciferol (VITAMIN D3) 25 MCG (1000 UNIT) tablet Take 1,000 Units by mouth in the morning.   diphenhydrAMINE (BENADRYL) 25 mg capsule Take 25 mg by mouth at bedtime as needed for sleep (for sleep).   ELIQUIS 5 MG TABS tablet TAKE 1 TABLET BY MOUTH TWICE A DAY   guaiFENesin (MUCINEX) 600 MG 12 hr tablet Take 1 tablet (600 mg total) by mouth 2 (two) times daily as needed.   HYDROcodone-acetaminophen (NORCO) 7.5-325 MG tablet Take 1 tablet by mouth every 4 (four) hours as needed.   hyoscyamine (LEVSIN, ANASPAZ) 0.125 MG tablet  Take 0.125 mg by mouth every 4 (four) hours as needed for cramping.    metoprolol succinate (TOPROL-XL) 50 MG 24 hr tablet Take 0.5 tablets (25 mg total) by mouth daily. Take with or immediately following a meal.   nitroGLYCERIN (NITROSTAT) 0.4 MG SL tablet Place 1 tablet (0.4 mg total) under the tongue every 5 (five) minutes as needed for chest pain.    pantoprazole (PROTONIX) 40 MG tablet TAKE 1 TABLET BY MOUTH EVERY DAY BEFORE BREAKFAST   PARoxetine (PAXIL) 40 MG tablet Take 40 mg by mouth in the morning.   Pirfenidone 801 MG TABS Take 1 tablet (801 mg total) by mouth 3 (three) times daily with meals. ([email or web address])   Probiotic Product (PROBIOTIC PO) Take 1 capsule by mouth daily.   sodium chloride (OCEAN) 0.65 % SOLN nasal spray Place 1 spray into both nostrils daily as needed for congestion.   tamsulosin (FLOMAX) 0.4 MG CAPS capsule Take 0.4 mg by mouth 2 (two) times daily.   triamcinolone cream (KENALOG) 0.1 % Apply 1 Application topically 2 (two) times daily.   [DISCONTINUED] clopidogrel (PLAVIX) 75 MG tablet TAKE 1 TABLET BY MOUTH EVERY DAY   [DISCONTINUED] gabapentin (NEURONTIN) 100 MG capsule Take 2 capsules (200 mg total) by mouth 3 (three) times daily.   [DISCONTINUED] sulfamethoxazole-trimethoprim (BACTRIM DS) 800-160 MG tablet TAKE 1 TABLET BY MOUTH THREE TIMES A WEEK   No facility-administered encounter medications on file as of 09/19/2022.    Physical Exam: Blood pressure 122/66, pulse (!) 54, temperature 97.6 F (36.4 C), temperature source Oral, height 6' (1.829 m), weight 192 lb 12.8 oz (87.5 kg), SpO2 97%. Gen:      No acute distress HEENT:  EOMI, sclera anicteric Neck:     No masses; no thyromegaly Lungs:    Bibasal crackles CV:         Regular rate and rhythm; no murmurs Abd:      + bowel sounds; soft, non-tender; no palpable masses, no distension Ext:    No edema; adequate peripheral perfusion Skin:      Warm and dry; no rash Neuro: alert and oriented x 3 Psych: normal mood and affect   Data Reviewed: Imaging: CT chest 07/18/2013-interstitial fibrosis with traction bronchiectasis with basilar predominance, emphysema.  Probable UIP pattern CT abdomen pelvis 02/07/17- basal pulmonary fibrosis. CT high-res 12/18/2020-new spiculated lung nodule in the right upper lobe PET scan 01/05/2021-uptake in the right upper  lobe, equivocal uptake in the right hilum.  Left sphenoid sinus disease. Chest x-ray 04/01/2021-dense right lower lobe infiltrate CTA 04/13/2021-no PE, large right pneumothorax CTA 05/18/2021-left-sided pulmonary embolism, worsening groundglass opacities, airspace disease-right greater than left. High-resolution CT 07/13/21 - resolution of consolidation and asbestos disease. Worsening pattern of pulmonary fibrosis in UIP pattern CT chest 09/17/2021-stable pattern of pulmonary fibrosis CT chest 03/17/2022-stable findings of pulmonary fibrosis. CT chest 09/12/2022-stable findings of pulmonary fibrosis. I have reviewed the images personally  PFTs: 11/27/2020 FVC 4.03 [85%], FEV1 2.95 [84%], F/F 73, TLC 6.09 [81%], DLCO 20.01 [73%] Minimal diffusion defect.  Labs: CTD serologies 11/26/2020-P ANCA 1:80  Cardiac: Cardiac cath 10/05/2020-normal left and right heart filling pressures.  Single-vessel coronary artery disease  Echocardiogram 05/19/2021- LVEF 60-65%, RV systolic function is normal with mild enlargement.  TR is signal is inadequate for measuring PA pressure.  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 Elevation in ANCA is likely nonspecific as he has no signs of connective tissue disease.  I reviewed his biopsy  with Dr. 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  Has developed a photosensitive rash recently He has since resumed Esbriet and is taking sun protection measures.  Will continue to monitor closely and consider switching to Ofev if the problem continues. LFTs in last month are normal CT scan shows stable fibrosis  Lower extremity edema Has intermittent lower extremity edema and is on diuretics Echo and cardiac cath within the last year reviewed with no significant pulmonary hypertension.  Continue monitoring  Recurrent right pneumothorax Resolved on  repeat imaging  Right upper lobe lung adenocarcinoma S/p bronchoscopy and lobectomy Continue surveillance CT scan  Submassive PE Continue Eliquis  Plan/Recommendations: Continue Esbriet Sun protection measures Continue monitoring labs Exercise at home  Chilton Greathouse MD [facility] 09/19/2022, 9:01 AM  CC: [name], MD

## 2022-09-26 DIAGNOSIS — J84112 Idiopathic pulmonary fibrosis: Secondary | ICD-10-CM | POA: Diagnosis not present

## 2022-09-29 DIAGNOSIS — R21 Rash and other nonspecific skin eruption: Secondary | ICD-10-CM | POA: Diagnosis not present

## 2022-09-29 NOTE — Progress Notes (Signed)
Cardiology Office Note:   Date:  09/30/2022  NAME:  Richard Parrish    MRN: [id number] DOB:  15-Oct-1950   PCP:  [name], MD  Cardiologist:  Lesleigh Noe, MD (Inactive)  Electrophysiologist:  None   Referring MD: [name], MD   Chief Complaint  Patient presents with   Follow-up         History of Present Illness:   Richard Parrish is a 72 y.o. male with a hx of CAD, PAD, HLD, lung CA, ILD who presents for follow-up.  He reports he has been diagnosed with a diffuse body rash.  This has been attributed to metoprolol.  He wishes to switch medications.  History of CAD status post CABG.  Has also had PCI to the vein graft to his RCA.  Cath recently is stable.  He also has PAD in the left leg status post left SFA stenting.  Followed by Dr. Myra Gianotti.  Lipids show an LDL of 77.  He is not diabetic.  Blood pressure is well-controlled.  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  He also was recently diagnosed with lung cancer status post resection.  Doing well.  Denies any chest pains or trouble breathing.  Euvolemic on examination.  Overall doing quite well.   Problem List CAD -CABG x 1 (SVG-RCA) 2. PAD -L SFA/popliteal stent 2013 -50-74% L SFA 3. HLD -T chol 136, HDL 45, LDL 77, TG 71 4. HTN 5. Stage IA Lung Adenocarcinoma -s/p resection  6. ILD 7. DVT/PE  Past Medical History: Past Medical History:  Diagnosis Date   Anxiety    Arthritis    "lower back" (04/07/2017)   BPH (benign prostatic hypertrophy)    C. difficile diarrhea    Chronic lower back pain    Coronary artery disease 1989   cabg with dvg to rca    Depression    GERD (gastroesophageal reflux disease)    Hyperlipidemia    Hypertension    Insomnia    Myocardial infarction (HCC) 2002   OSA on CPAP    cpap setting of 60 per pt   Osteoarthritis    "hips" (04/07/2017)   PAD (peripheral artery disease) (HCC)    Peripheral vascular disease (HCC)    Pneumonia 1989   S/P CABG   Pulmonary embolism (HCC)  05/18/2021   Pulmonary fibrosis (HCC)    "one time" (04/07/2017)   Spinal stenosis     Past Surgical History: Past Surgical History:  Procedure Laterality Date   ABDOMINAL AORTAGRAM N/A 03/15/2011   Procedure: ABDOMINAL Ronny Flurry;  Surgeon: Nada Libman, MD;  Location: [facility] CATH LAB;  Service: Cardiovascular;  Laterality: N/A;   ABDOMINAL AORTAGRAM N/A 04/12/2011   Procedure: ABDOMINAL Ronny Flurry;  Surgeon: Nada Libman, MD;  Location: [facility] CATH LAB;  Service: Cardiovascular;  Laterality: N/A;   BACK SURGERY     BRONCHIAL BIOPSY  03/03/2021   Procedure: BRONCHIAL BIOPSIES;  Surgeon: Josephine Igo, DO;  Location: [facility] ENDOSCOPY;  Service: Pulmonary;;   BRONCHIAL BRUSHINGS  03/03/2021   Procedure: BRONCHIAL BRUSHINGS;  Surgeon: Josephine Igo, DO;  Location: [facility] ENDOSCOPY;  Service: Pulmonary;;   BRONCHIAL NEEDLE ASPIRATION BIOPSY  03/03/2021   Procedure: BRONCHIAL NEEDLE ASPIRATION BIOPSIES;  Surgeon: Josephine Igo, DO;  Location: [facility] ENDOSCOPY;  Service: Pulmonary;;   CARDIAC CATHETERIZATION  08/1987   CORONARY ANGIOPLASTY WITH STENT PLACEMENT  ~ 2002   [facility], [place]   CORONARY ARTERY BYPASS GRAFT  09/1987   "  CABG X1";  [facility]; [place]; "RCA"   FIDUCIAL MARKER PLACEMENT  03/03/2021   Procedure: FIDUCIAL DYE MARKING;  Surgeon: Josephine Igo, DO;  Location: [facility] ENDOS[facility];  Service: Pulmonary;;   INTERCOSTAL NERVE BLOCK  03/03/2021   Procedure: INTERCOSTAL NERVE BLOCK;  Surgeon: Corliss Skains, MD;  Location: [facility] OR;  Service: Thoracic;;   JOINT REPLACEMENT     KNEE ARTHROSCOPY Left    LAPAROSCOPIC CHOLECYSTECTOMY     LYMPH NODE BIOPSY  03/03/2021   Procedure: LYMPH NODE BIOPSY;  Surgeon: Corliss Skains, MD;  Location: [facility] OR;  Service: Thoracic;;   LYMPH NODE DISSECTION  03/03/2021   Procedure: LYMPH NODE DISSECTION;  Surgeon: Corliss Skains, MD;  Location: [facility] OR;  Service: Thoracic;;   PERIPHERAL VASCULAR INTERVENTION Left 2016   "put a stent; right  branch of left femoral"   POSTERIOR LUMBAR FUSION  2009   lumbar fusion, S L 5 to L 4   RIGHT/LEFT HEART CATH AND CORONARY/GRAFT ANGIOGRAPHY N/A 10/05/2020   Procedure: RIGHT/LEFT HEART CATH AND CORONARY/GRAFT ANGIOGRAPHY;  Surgeon: Yvonne Kendall, MD;  Location: [facility] INVASIVE CV LAB;  Service: Cardiovascular;  Laterality: N/A;   SHOULDER ARTHROSCOPY W/ ROTATOR CUFF REPAIR Right 2006   SHOULDER ARTHROSCOPY WITH SUBACROMIAL DECOMPRESSION AND OPEN ROTATOR C Left ~ 1983   TONSILLECTOMY AND ADENOIDECTOMY     TOTAL HIP ARTHROPLASTY Right 2012   TOTAL HIP ARTHROPLASTY Left 04/07/2017   TOTAL HIP ARTHROPLASTY Left 04/07/2017   Procedure: LEFT TOTAL HIP ARTHROPLASTY;  Surgeon: Frederico Hamman, MD;  Location: [facility] OR;  Service: Orthopedics;  Laterality: Left;   VIDEO BRONCHOSCOPY WITH RADIAL ENDOBRONCHIAL ULTRASOUND  03/03/2021   Procedure: VIDEO BRONCHOSCOPY WITH RADIAL ENDOBRONCHIAL ULTRASOUND;  Surgeon: Josephine Igo, DO;  Location: [facility]OS[facility];  Service: Pulmonary;;    Current Medications: Current Meds  Medication Sig   carvedilol (COREG) 6.25 MG tablet Take 1 tablet (6.25 mg total) by mouth 2 (two) times daily.     Allergies:    Ambien [zolpidem tartrate] and Metoprolol   Social History: Social History   Socioeconomic History   Marital status: Married    Spouse name: Not on file   Number of children: Not on file   Years of education: Not on file   Highest education level: Not on file  Occupational History   Occupation: Retired   Occupation: Geophysical data processor  Tobacco Use   Smoking status: Former    Current packs/day: 0.00    Average packs/day: 2.0 packs/day for 15.0 years (30.0 ttl pk-yrs)    Types: Cigarettes    Start date: 02/29/1972    Quit date: 03/01/1987    Years since quitting: 35.6   Smokeless tobacco: Never   Tobacco comments:    03/02/21-Pt instructed to not vape or drink alcohol for 24 hours prior to surgery.   Vaping Use   Vaping status: Every Day   Last attempt to quit:  12/29/2020  Substance and Sexual Activity   Alcohol use: Yes    Comment: couple drinks/month   Drug use: No   Sexual activity: Not Currently  Other Topics Concern   Not on file  Social History Narrative   ** Merged History Encounter **       Social Determinants of Health   Financial Resource Strain: Not on file  Food Insecurity: Not on file  Transportation Needs: Not on file  Physical Activity: Not on file  Stress: Not on file  Social Connections: Not on file  Family History: The patient's family history includes Cancer in his father; Heart disease in his mother.  ROS:   All other ROS reviewed and negative. Pertinent positives noted in the HPI.     EKGs/Labs/Other Studies Reviewed:   The following studies were personally reviewed by me today:  EKG:  EKG is ordered today.    EKG Interpretation Date/Time:  Friday September 30 2022 10:30:16 EDT Ventricular Rate:  51 PR Interval:  220 QRS Duration:  94 QT Interval:  416 QTC Calculation: 383 R Axis:   -9  Text Interpretation: Sinus bradycardia with 1st degree A-V block Confirmed by Lennie Odor [phone]) on 09/30/2022 10:32:06 AM   TTE 05/19/2021  1. Left ventricular ejection fraction, by estimation, is 60 to 65%. The  left ventricle has normal function. The left ventricle has no regional  wall motion abnormalities. Left ventricular diastolic parameters are  indeterminate.   2. Right ventricular systolic function is normal. The right ventricular  size is mildly enlarged. Tricuspid regurgitation signal is inadequate for  assessing PA pressure.   3. A small pericardial effusion is present. There is no evidence of  cardiac tamponade.   4. The mitral valve is grossly normal. No evidence of mitral valve  regurgitation. No evidence of mitral stenosis.   5. The aortic valve is tricuspid. Aortic valve regurgitation is not  visualized. No aortic stenosis is present.   Recent Labs: 12/20/2021: NT-Pro BNP 430 09/12/2022: ALT  18; BUN 14; Creatinine 1.08; Hemoglobin 14.3; Platelet Count 295; Potassium 4.1; Sodium 139   Recent Lipid Panel    Component Value Date/Time   CHOL 97 10/04/2020 0235   CHOL 131 02/19/2020 0820   TRIG 46 10/04/2020 0235   HDL 35 (L) 10/04/2020 0235   HDL 45 02/19/2020 0820   CHOLHDL 2.8 10/04/2020 0235   VLDL 9 10/04/2020 0235   LDLCALC 53 10/04/2020 0235   LDLCALC 73 02/19/2020 0820    Physical Exam:   VS:  BP 122/72 (BP Location: Right Arm, Patient Position: Sitting, Cuff Size: Normal)   Pulse (!) 51   Ht 6' (1.829 m)   Wt 191 lb 9.6 oz (86.9 kg)   SpO2 97%   BMI 25.99 kg/m    Wt Readings from Last 3 Encounters:  09/30/22 191 lb 9.6 oz (86.9 kg)  09/19/22 192 lb 9.6 oz (87.4 kg)  09/19/22 192 lb 12.8 oz (87.5 kg)    General: Well nourished, well developed, in no acute distress Head: Atraumatic, normal size  Eyes: PEERLA, EOMI  Neck: Supple, no JVD Endocrine: No thryomegaly Cardiac: Normal S1, S2; RRR; no murmurs, rubs, or gallops Lungs: Clear to auscultation bilaterally, no wheezing, rhonchi or rales  Abd: Soft, nontender, no hepatomegaly  Ext: No edema, pulses 2+ Musculoskeletal: No deformities, BUE and BLE strength normal and equal Skin: Warm and dry Neuro: Alert and oriented to person, place, time, and situation, CNII-XII grossly intact, no focal deficits  Psych: Normal mood and affect   ASSESSMENT:   Richard Parrish is a 72 y.o. male who presents for the following: 1. Coronary artery disease involving coronary bypass graft of native heart without angina pectoris   2. PAD (peripheral artery disease) (HCC)   3. Mixed hyperlipidemia   4. Renovascular hypertension     PLAN:   1. Coronary artery disease involving coronary bypass graft of native heart without angina pectoris 2. PAD (peripheral artery disease) (HCC) 3. Mixed hyperlipidemia -CAD status post CABG.  Also with PCI to the vein graft  to the RCA.  No symptoms of angina.  Overall doing well.  Not on  aspirin as he is on Eliquis.  On Lipitor.  LDL 77.  Close enough to goal.  He needs to follow with Dr. Myra Gianotti regarding his left FSA stent.  Moderate disease noted on ultrasound last year.  No symptoms of claudication.  Suspect this can be followed.  Most recent echo was normal.  We will stop metoprolol given possible rash.  Start carvedilol 6.25 mg twice daily.  He will see me yearly.  Continue Lipitor.  He will let us know if he has any chest pains or trouble breathing.  4. Renovascular hypertension -Well-controlled.  Transition to carvedilol.  No metoprolol.      Disposition: Return in about 1 year (around 09/30/2023).  Medication Adjustments/Labs and Tests Ordered: Current medicines are reviewed at length with the patient today.  Concerns regarding medicines are outlined above.  Orders Placed This Encounter  Procedures   EKG 12-Lead   Meds ordered this encounter  Medications   carvedilol (COREG) 6.25 MG tablet    Sig: Take 1 tablet (6.25 mg total) by mouth 2 (two) times daily.    Dispense:  180 tablet    Refill:  3   Patient Instructions  Medication Instructions:  STOP METOPROLOL START CARVEDILOL 6.25 MG TWICE DAILY *If you need a refill on your cardiac medications before your next appointment, please call your pharmacy*   Lab Work: NONE If you have labs (blood work) drawn today and your tests are completely normal, you will receive your results only by: MyChart Message (if you have MyChart) OR A paper copy in the mail If you have any lab test that is abnormal or we need to change your treatment, we will call you to review the results.   Testing/Procedures: NONE   Follow-Up: At [facility], you and your health needs are our priority.  As part of our continuing mission to provide you with exceptional heart care, we have created designated Provider Care Teams.  These Care Teams include your primary Cardiologist (physician) and Advanced Practice Providers (APPs -   Physician Assistants and Nurse Practitioners) who all work together to provide you with the care you need, when you need it.  We recommend signing up for the patient portal called "MyChart".  Sign up information is provided on this After Visit Summary.  MyChart is used to connect with patients for Virtual Visits (Telemedicine).  Patients are able to view lab/test results, encounter notes, upcoming appointments, etc.  Non-urgent messages can be sent to your provider as well.   To learn more about what you can do with MyChart, go to [email or web address].    Your next appointment:   1 year(s)  Provider:   DR Flora Lipps  Other Instructions NONE    Time Spent with Patient: I have spent a total of 35 minutes with patient reviewing hospital notes, telemetry, EKGs, labs and examining the patient as well as establishing an assessment and plan that was discussed with the patient.  > 50% of time was spent in direct patient care.  Signed, Lenna Gilford. Flora Lipps, MD, [facility]  [facility]  [address] [place], [place] [address] [phone]  09/30/2022 11:09 AM

## 2022-09-30 ENCOUNTER — Encounter: Payer: Self-pay | Admitting: Cardiovascular Disease

## 2022-09-30 ENCOUNTER — Ambulatory Visit: Payer: PPO | Attending: Cardiology | Admitting: Cardiovascular Disease

## 2022-09-30 VITALS — BP 122/72 | HR 51 | Ht 72.0 in | Wt 191.6 lb

## 2022-09-30 DIAGNOSIS — I25810 Atherosclerosis of coronary artery bypass graft(s) without angina pectoris: Secondary | ICD-10-CM

## 2022-09-30 DIAGNOSIS — E782 Mixed hyperlipidemia: Secondary | ICD-10-CM

## 2022-09-30 DIAGNOSIS — I150 Renovascular hypertension: Secondary | ICD-10-CM

## 2022-09-30 DIAGNOSIS — I739 Peripheral vascular disease, unspecified: Secondary | ICD-10-CM | POA: Diagnosis not present

## 2022-09-30 MED ORDER — CARVEDILOL 6.25 MG PO TABS: 6.2500 mg | ORAL_TABLET | Freq: Two times a day (BID) | ORAL | 3 refills | Status: DC

## 2022-09-30 NOTE — Patient Instructions (Signed)
Medication Instructions:  STOP METOPROLOL START CARVEDILOL 6.25 MG TWICE DAILY *If you need a refill on your cardiac medications before your next appointment, please call your pharmacy*   Lab Work: NONE If you have labs (blood work) drawn today and your tests are completely normal, you will receive your results only by: MyChart Message (if you have MyChart) OR A paper copy in the mail If you have any lab test that is abnormal or we need to change your treatment, we will call you to review the results.   Testing/Procedures: NONE   Follow-Up: At [facility], you and your health needs are our priority.  As part of our continuing mission to provide you with exceptional heart care, we have created designated Provider Care Teams.  These Care Teams include your primary Cardiologist (physician) and Advanced Practice Providers (APPs -  Physician Assistants and Nurse Practitioners) who all work together to provide you with the care you need, when you need it.  We recommend signing up for the patient portal called "MyChart".  Sign up information is provided on this After Visit Summary.  MyChart is used to connect with patients for Virtual Visits (Telemedicine).  Patients are able to view lab/test results, encounter notes, upcoming appointments, etc.  Non-urgent messages can be sent to your provider as well.   To learn more about what you can do with MyChart, go to [email or web address].    Your next appointment:   1 year(s)  Provider:   DR Flora Lipps  Other Instructions NONE

## 2022-10-03 ENCOUNTER — Other Ambulatory Visit: Payer: Self-pay | Admitting: Pulmonary Disease

## 2022-10-07 ENCOUNTER — Ambulatory Visit: Payer: PPO | Admitting: Cardiology

## 2022-10-17 ENCOUNTER — Emergency Department (HOSPITAL_COMMUNITY): Payer: PPO

## 2022-10-17 ENCOUNTER — Other Ambulatory Visit: Payer: Self-pay

## 2022-10-17 ENCOUNTER — Telehealth: Payer: Self-pay | Admitting: Cardiovascular Disease

## 2022-10-17 ENCOUNTER — Encounter (HOSPITAL_COMMUNITY): Payer: Self-pay

## 2022-10-17 ENCOUNTER — Emergency Department (HOSPITAL_COMMUNITY)
Admission: EM | Admit: 2022-10-17 | Discharge: 2022-10-18 | Disposition: A | Discharge status: Home / Self Care | Payer: PPO | Attending: Emergency Medicine | Admitting: Emergency Medicine

## 2022-10-17 DIAGNOSIS — R0781 Pleurodynia: Secondary | ICD-10-CM | POA: Insufficient documentation

## 2022-10-17 DIAGNOSIS — R0602 Shortness of breath: Secondary | ICD-10-CM | POA: Diagnosis not present

## 2022-10-17 DIAGNOSIS — S2242XA Multiple fractures of ribs, left side, initial encounter for closed fracture: Secondary | ICD-10-CM | POA: Diagnosis not present

## 2022-10-17 DIAGNOSIS — R0789 Other chest pain: Secondary | ICD-10-CM | POA: Diagnosis not present

## 2022-10-17 DIAGNOSIS — L560 Drug phototoxic response: Secondary | ICD-10-CM | POA: Diagnosis not present

## 2022-10-17 DIAGNOSIS — Z5321 Procedure and treatment not carried out due to patient leaving prior to being seen by health care provider: Secondary | ICD-10-CM | POA: Diagnosis not present

## 2022-10-17 DIAGNOSIS — Z951 Presence of aortocoronary bypass graft: Secondary | ICD-10-CM | POA: Insufficient documentation

## 2022-10-17 DIAGNOSIS — J849 Interstitial pulmonary disease, unspecified: Secondary | ICD-10-CM | POA: Diagnosis not present

## 2022-10-17 DIAGNOSIS — R072 Precordial pain: Secondary | ICD-10-CM | POA: Diagnosis not present

## 2022-10-17 LAB — CBC
HCT: 44.1 % (ref 39.0–52.0)
Hemoglobin: 15.1 g/dL (ref 13.0–17.0)
MCH: 32.5 pg (ref 26.0–34.0)
MCHC: 34.2 g/dL (ref 30.0–36.0)
MCV: 94.8 fL (ref 80.0–100.0)
Platelets: 322 10*3/uL (ref 150–400)
RBC: 4.65 MIL/uL (ref 4.22–5.81)
RDW: 13.2 % (ref 11.5–15.5)
WBC: 6.9 10*3/uL (ref 4.0–10.5)
nRBC: 0 % (ref 0.0–0.2)

## 2022-10-17 LAB — BASIC METABOLIC PANEL
Anion gap: 11 (ref 5–15)
BUN: 15 mg/dL (ref 8–23)
CO2: 25 mmol/L (ref 22–32)
Calcium: 9.6 mg/dL (ref 8.9–10.3)
Chloride: 103 mmol/L (ref 98–111)
Creatinine, Ser: 0.96 mg/dL (ref 0.61–1.24)
GFR, Estimated: >60 mL/min (ref >60)
Glucose, Bld: 107 mg/dL — ABNORMAL HIGH (ref 70–99)
Potassium: 4.1 mmol/L (ref 3.5–5.1)
Sodium: 139 mmol/L (ref 135–145)

## 2022-10-17 LAB — TROPONIN I (HIGH SENSITIVITY): Troponin I (High Sensitivity): 11 ng/L (ref <18)

## 2022-10-17 NOTE — Telephone Encounter (Signed)
Pt sent message to scheduling regarding chest pain, some questions he did not answer  Pt c/o of Chest Pain: STAT if active CP, including tightness, pressure, jaw pain, radiating pain to shoulder/upper arm/back, CP unrelieved by Nitro. Symptoms reported of SOB, nausea, vomiting, sweating.  1. Are you having CP right now? Had a mild episode today    2. Are you experiencing any other symptoms (ex. SOB, nausea, vomiting, sweating)? heart beats fast or irregularly, dizzy, short hallucinatory visions. BP 156/80   3. Is your CP continuous or coming and going? N/A   4. Have you taken Nitroglycerin? Yes   5. How long have you been experiencing CP? N/A     6. If NO CP at time of call then end call with telling Pt to call back or call 911 if Chest pain returns prior to return call from triage team.

## 2022-10-17 NOTE — ED Triage Notes (Signed)
Pt c/o midsternal chest pain that radiates to mid upper back and [id number]. Pt wears 5L 02 per [place] at baseline. PT able to speak in complete sentences.

## 2022-10-17 NOTE — Telephone Encounter (Signed)
Returned call to pt. Currently having SOB, nausea, vomiting and sweating. No chest pain but is having "slight" pressure , he states he had to get down on one knee and it lasted 30 seconds this morning at the store. He took a nitroglycerin and it helped "some". He did get dizzy, blurry vision and disoriented with hallucinations. Fast heart rate at the time this happens. These episodes have happened 3 times in 4 weeks.Current  Bp is 162/94 HR 77. Advised pt and wife to go to the ER with his current symptoms he is having. Both verbalized understanding.

## 2022-10-17 NOTE — ED Provider Triage Note (Signed)
Emergency Medicine Provider Triage Evaluation Note  Richard Parrish , a 72 y.o. male  was evaluated in triage.  Pt complains of episodes of chest discomfort that started in the middle of his chest over his sternum and last about 30 seconds at a time.  These seem to come on at random.  Most recent episode was this morning when walking in his yard.  This has happened about 4 times over the past month with some of them occurring at rest.  When it happens he says things become "hallucinogenic" with objects seem to move and figures.  He never loses consciousness but says it would not surprise him if he passed out.  Denies any current chest pain, arm pain, jaw pain, nausea or vomiting, shortness of breath.  Has history of CABG and neoplasm of the right lung which was surgically removed this year.  Review of Systems  Positive: As above Negative: As above  Physical Exam  BP 125/77 (BP Location: Right Arm)   Pulse 81   Temp 98.6 F (37 C)   Resp 18   Ht 6' (1.829 m)   Wt 86.9 kg   SpO2 96%   BMI 25.98 kg/m  Gen:   Awake, no distress   Resp:  Normal effort  MSK:   Moves extremities without difficulty  Other:  Cardiac auscultation with regular rate and rhythm, lungs clear to auscultation bilaterally  Medical Decision Making  Medically screening exam initiated at 6:10 PM.  Appropriate orders placed.  [name] was informed that the remainder of the evaluation will be completed by another provider, this initial triage assessment does not replace that evaluation, and the importance of remaining in the ED until their evaluation is complete.     Arabella Merles, PA-C 10/17/22 1823

## 2022-10-18 DIAGNOSIS — F331 Major depressive disorder, recurrent, moderate: Secondary | ICD-10-CM | POA: Diagnosis not present

## 2022-10-18 DIAGNOSIS — F411 Generalized anxiety disorder: Secondary | ICD-10-CM | POA: Diagnosis not present

## 2022-10-23 ENCOUNTER — Encounter: Payer: Self-pay | Admitting: Internal Medicine

## 2022-10-24 DIAGNOSIS — R319 Hematuria, unspecified: Secondary | ICD-10-CM | POA: Diagnosis not present

## 2022-10-26 DIAGNOSIS — Z79891 Long term (current) use of opiate analgesic: Secondary | ICD-10-CM | POA: Diagnosis not present

## 2022-10-26 DIAGNOSIS — Z79899 Other long term (current) drug therapy: Secondary | ICD-10-CM | POA: Diagnosis not present

## 2022-10-26 DIAGNOSIS — G894 Chronic pain syndrome: Secondary | ICD-10-CM | POA: Diagnosis not present

## 2022-10-26 DIAGNOSIS — M48062 Spinal stenosis, lumbar region with neurogenic claudication: Secondary | ICD-10-CM | POA: Diagnosis not present

## 2022-10-26 DIAGNOSIS — M545 Low back pain, unspecified: Secondary | ICD-10-CM | POA: Diagnosis not present

## 2022-10-27 DIAGNOSIS — J84112 Idiopathic pulmonary fibrosis: Secondary | ICD-10-CM | POA: Diagnosis not present

## 2022-11-01 ENCOUNTER — Telehealth: Payer: Self-pay | Admitting: Pulmonary Disease

## 2022-11-01 DIAGNOSIS — J849 Interstitial pulmonary disease, unspecified: Secondary | ICD-10-CM

## 2022-11-01 NOTE — Telephone Encounter (Signed)
Patient's wife is calling for a refill on porfenidon 801mg .

## 2022-11-02 MED ORDER — PIRFENIDONE 801 MG PO TABS
1.0000 | ORAL_TABLET | Freq: Three times a day (TID) | ORAL | 5 refills | Status: DC
Start: 2022-11-02 — End: 2023-03-30

## 2022-11-02 NOTE — Telephone Encounter (Signed)
Refill sent for PIRFENIDONE to  [facility]  Dose: 801mg  three times daily  Last OV: 09/19/22 Provider: Dr. Isaiah Serge Pertinent labs: LFTs on 09/12/22 wnl  Next OV: 12/22/22  Routing to scheduling team for follow-up on appt scheduling  Chesley Mires, PharmD, MPH, BCPS Clinical Pharmacist (Rheumatology and Pulmonology)

## 2022-11-02 NOTE — Telephone Encounter (Signed)
Sending message to the pharmacy team

## 2022-11-17 DIAGNOSIS — I25119 Atherosclerotic heart disease of native coronary artery with unspecified angina pectoris: Secondary | ICD-10-CM | POA: Diagnosis not present

## 2022-11-17 DIAGNOSIS — Z902 Acquired absence of lung [part of]: Secondary | ICD-10-CM | POA: Diagnosis not present

## 2022-11-17 DIAGNOSIS — I739 Peripheral vascular disease, unspecified: Secondary | ICD-10-CM | POA: Diagnosis not present

## 2022-11-17 DIAGNOSIS — J849 Interstitial pulmonary disease, unspecified: Secondary | ICD-10-CM | POA: Diagnosis not present

## 2022-11-17 DIAGNOSIS — Z9981 Dependence on supplemental oxygen: Secondary | ICD-10-CM | POA: Diagnosis not present

## 2022-11-17 DIAGNOSIS — F418 Other specified anxiety disorders: Secondary | ICD-10-CM | POA: Diagnosis not present

## 2022-11-17 DIAGNOSIS — F322 Major depressive disorder, single episode, severe without psychotic features: Secondary | ICD-10-CM | POA: Diagnosis not present

## 2022-11-17 DIAGNOSIS — E7800 Pure hypercholesterolemia, unspecified: Secondary | ICD-10-CM | POA: Diagnosis not present

## 2022-11-17 DIAGNOSIS — Z23 Encounter for immunization: Secondary | ICD-10-CM | POA: Diagnosis not present

## 2022-11-17 DIAGNOSIS — I270 Primary pulmonary hypertension: Secondary | ICD-10-CM | POA: Diagnosis not present

## 2022-11-17 DIAGNOSIS — I10 Essential (primary) hypertension: Secondary | ICD-10-CM | POA: Diagnosis not present

## 2022-11-25 ENCOUNTER — Other Ambulatory Visit: Payer: Self-pay

## 2022-11-25 ENCOUNTER — Emergency Department: Payer: PPO

## 2022-11-25 ENCOUNTER — Emergency Department
Admission: EM | Admit: 2022-11-25 | Discharge: 2022-11-25 | Disposition: A | Discharge status: Home / Self Care | Payer: PPO | Attending: Emergency Medicine | Admitting: Emergency Medicine

## 2022-11-25 DIAGNOSIS — R000 Tachycardia, unspecified: Secondary | ICD-10-CM | POA: Diagnosis not present

## 2022-11-25 DIAGNOSIS — I10 Essential (primary) hypertension: Secondary | ICD-10-CM | POA: Diagnosis not present

## 2022-11-25 DIAGNOSIS — R55 Syncope and collapse: Secondary | ICD-10-CM | POA: Diagnosis not present

## 2022-11-25 DIAGNOSIS — I2510 Atherosclerotic heart disease of native coronary artery without angina pectoris: Secondary | ICD-10-CM | POA: Diagnosis not present

## 2022-11-25 DIAGNOSIS — R0602 Shortness of breath: Secondary | ICD-10-CM | POA: Insufficient documentation

## 2022-11-25 DIAGNOSIS — R002 Palpitations: Secondary | ICD-10-CM | POA: Diagnosis present

## 2022-11-25 DIAGNOSIS — J984 Other disorders of lung: Secondary | ICD-10-CM | POA: Diagnosis not present

## 2022-11-25 LAB — CBC WITH DIFFERENTIAL/PLATELET
Abs Immature Granulocytes: 0.05 10*3/uL (ref 0.00–0.07)
Basophils Absolute: 0.1 10*3/uL (ref 0.0–0.1)
Basophils Relative: 1 %
Eosinophils Absolute: 0.4 10*3/uL (ref 0.0–0.5)
Eosinophils Relative: 4 %
HCT: 44.4 % (ref 39.0–52.0)
Hemoglobin: 15.8 g/dL (ref 13.0–17.0)
Immature Granulocytes: 1 %
Lymphocytes Relative: 7 %
Lymphs Abs: 0.6 10*3/uL — ABNORMAL LOW (ref 0.7–4.0)
MCH: 33.1 pg (ref 26.0–34.0)
MCHC: 35.6 g/dL (ref 30.0–36.0)
MCV: 92.9 fL (ref 80.0–100.0)
Monocytes Absolute: 1 10*3/uL (ref 0.1–1.0)
Monocytes Relative: 12 %
Neutro Abs: 6.5 10*3/uL (ref 1.7–7.7)
Neutrophils Relative %: 75 %
Platelets: 320 10*3/uL (ref 150–400)
RBC: 4.78 MIL/uL (ref 4.22–5.81)
RDW: 13.2 % (ref 11.5–15.5)
WBC: 8.6 10*3/uL (ref 4.0–10.5)
nRBC: 0 % (ref 0.0–0.2)

## 2022-11-25 LAB — COMPREHENSIVE METABOLIC PANEL
ALT: 23 U/L (ref 0–44)
AST: 30 U/L (ref 15–41)
Albumin: 4.1 g/dL (ref 3.5–5.0)
Alkaline Phosphatase: 78 U/L (ref 38–126)
Anion gap: 11 (ref 5–15)
BUN: 17 mg/dL (ref 8–23)
CO2: 24 mmol/L (ref 22–32)
Calcium: 9.7 mg/dL (ref 8.9–10.3)
Chloride: 103 mmol/L (ref 98–111)
Creatinine, Ser: 0.89 mg/dL (ref 0.61–1.24)
GFR, Estimated: >60 mL/min (ref >60)
Glucose, Bld: 112 mg/dL — ABNORMAL HIGH (ref 70–99)
Potassium: 3.4 mmol/L — ABNORMAL LOW (ref 3.5–5.1)
Sodium: 138 mmol/L (ref 135–145)
Total Bilirubin: 0.9 mg/dL (ref 0.3–1.2)
Total Protein: 7 g/dL (ref 6.5–8.1)

## 2022-11-25 LAB — TROPONIN I (HIGH SENSITIVITY): Troponin I (High Sensitivity): 9 ng/L (ref <18)

## 2022-11-25 NOTE — ED Provider Notes (Signed)
[facility] Provider Note    Event Date/Time   First MD Initiated Contact with Patient 11/25/22 2042     (approximate)   History   Shortness of Breath and Palpitations   HPI  Richard Parrish is a 72 y.o. male with a history of CAD, hypertension, hyperlipidemia, OSA on CPAP, peripheral artery disease, pulmonary fibrosis, PE, and BPH who presents with an episode of increased shortness of breath, palpitations, generalized weakness, and feeling like he was going to pass out.  This lasted for about 30 minutes prior to arrival and then subsided after he got to the ED.  He is now asymptomatic although he did have 1 more episode of palpitations and an elevated heart rate that lasted a couple of minutes.  He states he is now back to his baseline.  He denies any chest pain, fever or chills, vomiting or diarrhea.  I reviewed the past medical records.  The patient's most recent outpatient encounter was with internal medicine on 9/19 for hypertension and management of his other chronic conditions.   Physical Exam   Triage Vital Signs: ED Triage Vitals  Encounter Vitals Group     BP 11/25/22 2009 (!) 159/128     Systolic BP Percentile --      Diastolic BP Percentile --      Pulse Rate 11/25/22 2009 (!) 109     Resp 11/25/22 2009 (!) 28     Temp 11/25/22 2012 97.9 F (36.6 C)     Temp Source 11/25/22 2009 Oral     SpO2 11/25/22 2009 (!) 86 %     Weight 11/25/22 2010 187 lb (84.8 kg)     Height 11/25/22 2010 6' (1.829 m)     Head Circumference --      Peak Flow --      Pain Score 11/25/22 2010 8     Pain Loc --      Pain Education --      Exclude from Growth Chart --     Most recent vital signs: Vitals:   11/25/22 2038 11/25/22 2200  BP: 93/73 (!) 105/55  Pulse:  86  Resp: 20 (!) 24  Temp:    SpO2:  100%     General: Awake, no distress.  CV:  Good peripheral perfusion.  Resp:  Normal effort.  Coarse breath sounds bilaterally with no wheezes or  rales. Abd:  No distention.  Other:  Trace bilateral lower extremity edema.   ED Results / Procedures / Treatments   Labs (all labs ordered are listed, but only abnormal results are displayed) Labs Reviewed  COMPREHENSIVE METABOLIC PANEL - Abnormal; Notable for the following components:      Result Value   Potassium 3.4 (*)    Glucose, Bld 112 (*)    All other components within normal limits  CBC WITH DIFFERENTIAL/PLATELET - Abnormal; Notable for the following components:   Lymphs Abs 0.6 (*)    All other components within normal limits  TROPONIN I (HIGH SENSITIVITY)  TROPONIN I (HIGH SENSITIVITY)     EKG  ED ECG REPORT I, Dionne Bucy, the attending physician, personally viewed and interpreted this ECG.  Date: 11/25/2022 EKG Time: 2015 Rate: 114 Rhythm: Tachycardia with PVCs QRS Axis: normal Intervals: LAFB ST/T Wave abnormalities: Nonspecific ST abnormalities, interpretation limited due to poor EKG baseline Narrative Interpretation: no evidence of acute ischemia    RADIOLOGY  Chest x-ray: I independently viewed and interpreted the images; there are chronic  findings with no focal consolidation or edema   PROCEDURES:  Critical Care performed: No  Procedures   MEDICATIONS ORDERED IN ED: Medications - No data to display   IMPRESSION / MDM / ASSESSMENT AND PLAN / ED COURSE  I reviewed the triage vital signs and the nursing notes.  72 year old male with PMH as noted above presents with an acute episode of increased shortness of breath, palpitations, lightheadedness, generalized pain, all now resolved.  He had a second episode that was much less severe with mild palpitations.  During that time his heart rate was noted to be in the 120s on the monitor.  This also resolved without intervention.  Differential diagnosis includes, but is not limited to, exacerbation of chronic lung disease, acute bronchitis, bronchospasm/reactive airways, vasovagal episode,  other near syncope, cardiac dysrhythmia.  I have a low suspicion for ACS.  There is no evidence of PE given the episodic symptoms.  When the patient had the less severe episode in the ED, EKG showed sinus tachycardia with some PVCs.  His heart rate is now in the 80s in sinus rhythm.  Patient's presentation is most consistent with acute presentation with potential threat to life or bodily function.  The patient is on the cardiac monitor to evaluate for evidence of arrhythmia and/or significant heart rate changes.  Lab work has been obtained and is unremarkable.  Initial troponin is negative.  Electrolytes are normal.  There is no leukocytosis or anemia.  I counseled the patient on the results of workup.  I did consider whether he may benefit from inpatient admission due to the near syncopal episode so that he can have additional cardiac monitoring.  I offered admission to the patient.  However, the patient states he is feeling back to baseline and is eager to go home.  Given the negative workup I think that this is reasonable.  Since the patient did not have any acute chest pain or ischemic EKG changes, I have canceled the repeat troponin.  Presentation is not consistent with ACS.  The patient is able to ambulate around the room without any recurrence of lightheadedness or shortness of breath.  He is on his baseline 5 L O2.  He states he feels well to go home.  I counseled him on the results of the workup and on follow-up.  I gave strict return precautions and he expressed understanding.   FINAL CLINICAL IMPRESSION(S) / ED DIAGNOSES   Final diagnoses:  Near syncope  Tachycardia     Rx / DC Orders   ED Discharge Orders     None        Note:  This document was prepared using Dragon voice recognition software and may include unintentional dictation errors.    Dionne Bucy, MD 11/25/22 2348

## 2022-11-25 NOTE — ED Notes (Signed)
Pts O2 increased to 6L [place] and repositioned in bed to help support WOB.

## 2022-11-25 NOTE — ED Triage Notes (Signed)
Pt reports shortness of breath and palpitations about 30 minutes ago. Pt on 5L/[place] due to interstitial Lung Disease. Moderate respiratory distress noted.

## 2022-11-25 NOTE — Discharge Instructions (Signed)
Continue take all of your normal medications as prescribed.  Continue using her oxygen.  Follow-up with your primary care doctor and cardiologist.  Return to the ER for new, worsening, or persistent severe palpitations, chest pain, difficulty breathing, weakness or lightheadedness, feel like you are going to pass out, or any other new or worsening symptoms that concern you.  You may also return at any time if you change your mind and decide that you want to be monitored in the hospital.

## 2022-11-27 DIAGNOSIS — J84112 Idiopathic pulmonary fibrosis: Secondary | ICD-10-CM | POA: Diagnosis not present

## 2022-11-28 ENCOUNTER — Telehealth: Payer: Self-pay | Admitting: Cardiovascular Disease

## 2022-11-28 NOTE — Telephone Encounter (Signed)
.  SYNCOPECHMG   Pt c/o Syncope: STAT if syncope occurred within 24 hours and pt complains of lightheadedness.   High Priority if episode of passing out, completely, today or in last 24 hours   1. Did you pass out today?   No   2. When is the last time you passed out?  Friday (9/27) evening  3. Has this occurred multiple times?   Yes  4. Did you have any symptoms prior to passing out?    Wife stated patient calls it an "out of body" experience.  Wife stated patient stated patient feels like he is going to pass out and he can't control it.  Wife noted patient's blood pressure spiked to 195 over 80-something  5. Did you fall? If so, are you on a blood thinner?  Wife stated patient never completely passed out.   Wife is on a blood thinner  Wife stated patient went to ED and they diagnosed him with "near syncope".  Wife wants call back to discuss next steps.

## 2022-11-28 NOTE — Telephone Encounter (Signed)
Call to wifes number, it does not ring.Called again and able to speak with wife. Advised next step will be his F/U and discussion with provider.  Moved her appt from end of October to this week 10/3 with NP>

## 2022-12-01 ENCOUNTER — Encounter: Payer: Self-pay | Admitting: General Practice

## 2022-12-01 ENCOUNTER — Ambulatory Visit: Payer: PPO | Attending: General Practice | Admitting: Emergency Medicine

## 2022-12-01 VITALS — BP 110/84 | HR 68 | Ht 72.0 in | Wt 188.6 lb

## 2022-12-01 DIAGNOSIS — I25810 Atherosclerosis of coronary artery bypass graft(s) without angina pectoris: Secondary | ICD-10-CM

## 2022-12-01 DIAGNOSIS — E782 Mixed hyperlipidemia: Secondary | ICD-10-CM | POA: Diagnosis not present

## 2022-12-01 DIAGNOSIS — R55 Syncope and collapse: Secondary | ICD-10-CM

## 2022-12-01 DIAGNOSIS — I10 Essential (primary) hypertension: Secondary | ICD-10-CM | POA: Diagnosis not present

## 2022-12-01 DIAGNOSIS — Z79899 Other long term (current) drug therapy: Secondary | ICD-10-CM | POA: Diagnosis not present

## 2022-12-01 DIAGNOSIS — J849 Interstitial pulmonary disease, unspecified: Secondary | ICD-10-CM | POA: Diagnosis not present

## 2022-12-01 NOTE — Progress Notes (Signed)
Cardiology Clinic Note   Patient Name: Richard Parrish Date of Encounter: 12/01/2022  Primary Care Provider:  [name], MD Primary Cardiologist:  Reatha Harps, MD  Patient Profile    Richard Parrish is a 72 year old male comes to clinic for ED follow-up for syncope.  Past Medical History    Past Medical History:  Diagnosis Date   Anxiety    Arthritis    "lower back" (04/07/2017)   BPH (benign prostatic hypertrophy)    C. difficile diarrhea    Chronic lower back pain    Coronary artery disease 1989   cabg with dvg to rca    Depression    GERD (gastroesophageal reflux disease)    Hyperlipidemia    Hypertension    Insomnia    Myocardial infarction (HCC) 2002   OSA on CPAP    cpap setting of 60 per pt   Osteoarthritis    "hips" (04/07/2017)   PAD (peripheral artery disease) (HCC)    Peripheral vascular disease (HCC)    Pneumonia 1989   S/P CABG   Pulmonary embolism (HCC) 05/18/2021   Pulmonary fibrosis (HCC)    "one time" (04/07/2017)   Spinal stenosis    Past Surgical History:  Procedure Laterality Date   ABDOMINAL AORTAGRAM N/A 03/15/2011   Procedure: ABDOMINAL Ronny Flurry;  Surgeon: Nada Libman, MD;  Location: [facility] CATH LAB;  Service: Cardiovascular;  Laterality: N/A;   ABDOMINAL AORTAGRAM N/A 04/12/2011   Procedure: ABDOMINAL Ronny Flurry;  Surgeon: Nada Libman, MD;  Location: [facility] CATH LAB;  Service: Cardiovascular;  Laterality: N/A;   BACK SURGERY     BRONCHIAL BIOPSY  03/03/2021   Procedure: BRONCHIAL BIOPSIES;  Surgeon: Josephine Igo, DO;  Location: [facility] ENDOSCOPY;  Service: Pulmonary;;   BRONCHIAL BRUSHINGS  03/03/2021   Procedure: BRONCHIAL BRUSHINGS;  Surgeon: Josephine Igo, DO;  Location: [facility] ENDOSCOPY;  Service: Pulmonary;;   BRONCHIAL NEEDLE ASPIRATION BIOPSY  03/03/2021   Procedure: BRONCHIAL NEEDLE ASPIRATION BIOPSIES;  Surgeon: Josephine Igo, DO;  Location: [facility] ENDOSCOPY;  Service: Pulmonary;;   CARDIAC CATHETERIZATION  08/1987   CORONARY  ANGIOPLASTY WITH STENT PLACEMENT  ~ 2002   [facility], [place]   CORONARY ARTERY BYPASS GRAFT  09/1987   "CABG X1";  [facility]; [place]; "RCA"   FIDUCIAL MARKER PLACEMENT  03/03/2021   Procedure: FIDUCIAL DYE MARKING;  Surgeon: Josephine Igo, DO;  Location: [facility] ENDOSCOPY;  Service: Pulmonary;;   INTERCOSTAL NERVE BLOCK  03/03/2021   Procedure: INTERCOSTAL NERVE BLOCK;  Surgeon: Corliss Skains, MD;  Location: [facility] OR;  Service: Thoracic;;   JOINT REPLACEMENT     KNEE ARTHROSCOPY Left    LAPAROSCOPIC CHOLECYSTECTOMY     LYMPH NODE BIOPSY  03/03/2021   Procedure: LYMPH NODE BIOPSY;  Surgeon: Corliss Skains, MD;  Location: [facility] OR;  Service: Thoracic;;   LYMPH NODE DISSECTION  03/03/2021   Procedure: LYMPH NODE DISSECTION;  Surgeon: Corliss Skains, MD;  Location: [facility] OR;  Service: Thoracic;;   PERIPHERAL VASCULAR INTERVENTION Left 2016   "put a stent; right branch of left femoral"   POSTERIOR LUMBAR FUSION  2009   lumbar fusion, S L 5 to L 4   RIGHT/LEFT HEART CATH AND CORONARY/GRAFT ANGIOGRAPHY N/A 10/05/2020   Procedure: RIGHT/LEFT HEART CATH AND CORONARY/GRAFT ANGIOGRAPHY;  Surgeon: Yvonne Kendall, MD;  Location: [facility] INVASIVE CV LAB;  Service: Cardiovascular;  Laterality: N/A;   SHOULDER ARTHROSCOPY W/ ROTATOR CUFF REPAIR Right 2006   SHOULDER  ARTHROSCOPY WITH SUBACROMIAL DECOMPRESSION AND OPEN ROTATOR C Left ~ 1983   TONSILLECTOMY AND ADENOIDECTOMY     TOTAL HIP ARTHROPLASTY Right 2012   TOTAL HIP ARTHROPLASTY Left 04/07/2017   TOTAL HIP ARTHROPLASTY Left 04/07/2017   Procedure: LEFT TOTAL HIP ARTHROPLASTY;  Surgeon: Frederico Hamman, MD;  Location: [facility] OR;  Service: Orthopedics;  Laterality: Left;   VIDEO BRONCHOSCOPY WITH RADIAL ENDOBRONCHIAL ULTRASOUND  03/03/2021   Procedure: VIDEO BRONCHOSCOPY WITH RADIAL ENDOBRONCHIAL ULTRASOUND;  Surgeon: Josephine Igo, DO;  Location: [facility] ENDOSCOPY;  Service: Pulmonary;;    Allergies  Allergies  Allergen Reactions   Ambien  [Zolpidem Tartrate] Other (See Comments)    Bad dreams/Vivid Dreams   Metoprolol Rash    History of Present Illness    Richard Parrish is a 72 year old male with past medical history of CAD S/P CABG, hypertension, OSA, PAD, AAA, ILD, and lung cancer.   CAD dates back to 2012 when cardiac cath was performed with failed PCI requiring a CABG.  In 2022 patient underwent left heart cath for complaint of exertional chest pain and shortness of breath.  The catheterization revealed chronic total occlusion of ostial RCA with severe single-vessel disease with patent SVG-RCA and normal left and right filling pressures.  It was recommended to continue medical therapy as treatment.   In 04/2021 was admitted to the hospital and found to have submassive PE with heart strain.  Left peroneal vein DVT was also he was discharged on anticoagulation home oxygen.  His Imdur was discontinued upon discharge due to orthostasis.  He was seen in the ED on 10/06/2021 chest pain and tightness.  EKG at the time showed no ischemic changes and troponins were negative.  On his follow-up visit on 820 11/17/2021 he was doing well with no recurrence of chest pain after discontinuing his Imdur.   He was seen by Dr. Bufford Buttner on 09/30/2018 for with complaints of a diffuse body rash related to metoprolol.  Metoprolol was discontinued during this visit and carvedilol 6.25mg  was started twice daily.  Of note he does have stage 1A nonsmall cell lung cancer status post right upper lobectomy as well as wedge resection of the right lower lobe and lymph node dissection.  He is currently following with oncology who recommends repeat CT scans of his chest every 6 months.  His latest CT scan of his chest showed no concerning findings for disease recurrence or metastasis.  He was seen in the emergency department on 11/25/2018 for with a chief complaint of palpitations and near syncope.  It was noted that he experienced an episode of shortness of breath,  palpitations, weakness, and felt like he was going to pass out.  This episode lasted 30 minutes prior to ED arrival and he had 1 more episode of palpitations and elevated heart rate that lasted several minutes while in the ED.  He denies chest pain during these events.  His lab work obtained in the ED including serial troponins, CBC, BMP are all within normal limits.  X-ray of his chest was unremarkable and his EKG showed no ischemic changes.  The ED provider did offer patient admission to the hospital however patient was feeling back to his baseline and eager to go home.  Mr. Ress presents today for a 42-month follow-up visit and recent emergency room visit for near syncope 6 days ago.  Since last being seen in the office patient has been doing well overall.  He notes that 6 days ago he had an ER visit  for an episode of dizziness and lightheadedness.  Patient notes that he did not eat all day, took his morning medications around 3 PM, and did not drink any water.  He notes that he went to a bar at a [employer] and had 2-3 alcoholic beverages.  He notes that when he got up to go to the bathroom he suddenly became dizzy,  lightheaded, and felt his heart racing and went to the emergency room.  Patient notes while he was at the bar he was not wearing the 5 L of oxygen that he is supposed to be wearing at all times.  He noted that his baseline shortness of breath did not worsen during this event.  Patient notes that he felt that he was dehydrated and may have gotten up from his chair too quickly.  Patient does note that over the past week since his ED visit he has had no more episodes of chest pain, dizziness, lightheadedness, near-syncope, syncope.  Over the past week patient has been walking to and from his mailbox, doing work around the house, and going bow hunting for deer without any pain or complaints.  His blood pressure today is 110/84.  He has not had any unexpected weight gain or loss since last  visit.  He denies chest pain, lower extremity edema, fatigue, palpitations, hemoptysis, diaphoresis, weakness, presyncope, syncope, orthopnea, and PND.   Home Medications    Prior to Admission medications   Medication Sig Start Date End Date Taking? Authorizing Provider  acetaminophen (TYLENOL) 500 MG tablet Take 1 tablet (500 mg total) by mouth every 6 (six) hours as needed for mild pain. 05/26/21   Standley Brooking, MD  ALPRAZolam Prudy Feeler) 0.5 MG tablet Take 0.5-1 tablets (0.25-0.5 mg total) by mouth 3 (three) times daily as needed for anxiety or sleep. 05/26/21   Standley Brooking, MD  antiseptic oral rinse (BIOTENE) LIQD 15 mLs by Mouth Rinse route daily as needed for dry mouth.    [provider]  atorvastatin (LIPITOR) 80 MG tablet TAKE 1 TABLET BY MOUTH EVERY DAY 12/15/21   Lyn Records, MD  budesonide (ENTOCORT EC) 3 MG 24 hr capsule Take 3 mg by mouth 3 (three) times daily.    [provider]  carvedilol (COREG) 6.25 MG tablet Take 1 tablet (6.25 mg total) by mouth 2 (two) times daily. 09/30/22   Meriam Sprague, MD  cholecalciferol (VITAMIN D3) 25 MCG (1000 UNIT) tablet Take 1,000 Units by mouth in the morning.    [provider]  diphenhydrAMINE (BENADRYL) 25 mg capsule Take 25 mg by mouth at bedtime as needed for sleep (for sleep).    [provider]  ELIQUIS 5 MG TABS tablet TAKE 1 TABLET BY MOUTH TWICE A DAY 10/03/22   Mannam, Praveen, MD  guaiFENesin (MUCINEX) 600 MG 12 hr tablet Take 1 tablet (600 mg total) by mouth 2 (two) times daily as needed. 04/27/21   Leary Roca, PA-C  HYDROcodone-acetaminophen (NORCO) 7.5-325 MG tablet Take 1 tablet by mouth every 4 (four) hours as needed. 08/26/21   [provider]  hyoscyamine (LEVSIN, ANASPAZ) 0.125 MG tablet Take 0.125 mg by mouth every 4 (four) hours as needed for cramping.     [provider]  nitroGLYCERIN (NITROSTAT) 0.4 MG SL tablet Place 1 tablet (0.4 mg total)  under the tongue every 5 (five) minutes as needed for chest pain. 01/27/22   Lyn Records, MD  pantoprazole (PROTONIX) 40 MG tablet TAKE  1 TABLET BY MOUTH EVERY DAY BEFORE BREAKFAST 12/27/21   Lyn Records, MD  PARoxetine (PAXIL) 40 MG tablet Take 40 mg by mouth in the morning. 01/05/15   [provider]  Pirfenidone 801 MG TABS Take 1 tablet (801 mg total) by mouth 3 (three) times daily with meals. ([email or web address]) 11/02/22   Chilton Greathouse, MD  Probiotic Product (PROBIOTIC PO) Take 1 capsule by mouth daily.    [provider]  sodium chloride (OCEAN) 0.65 % SOLN nasal spray Place 1 spray into both nostrils daily as needed for congestion.    [provider]  tamsulosin (FLOMAX) 0.4 MG CAPS capsule Take 0.4 mg by mouth 2 (two) times daily. 09/04/21   [provider]  triamcinolone cream (KENALOG) 0.1 % Apply 1 Application topically 2 (two) times daily.    [provider]    Family History    Family History  Problem Relation Age of Onset   Heart disease Mother    Cancer Father    He indicated that his mother is deceased. He indicated that his father is deceased. He indicated that his maternal grandmother is deceased. He indicated that his maternal grandfather is deceased. He indicated that his paternal grandmother is deceased. He indicated that his paternal grandfather is deceased.  Social History    Social History   Socioeconomic History   Marital status: Married    Spouse name: Not on file   Number of children: Not on file   Years of education: Not on file   Highest education level: Not on file  Occupational History   Occupation: Retired   Occupation: Geophysical data processor  Tobacco Use   Smoking status: Former    Current packs/day: 0.00    Average packs/day: 2.0 packs/day for 15.0 years (30.0 ttl pk-yrs)    Types: Cigarettes    Start date: 02/29/1972    Quit date: 03/01/1987    Years since quitting: 35.7   Smokeless tobacco: Never   Tobacco  comments:    03/02/21-Pt instructed to not vape or drink alcohol for 24 hours prior to surgery.   Vaping Use   Vaping status: Every Day   Last attempt to quit: 12/29/2020  Substance and Sexual Activity   Alcohol use: Yes    Comment: couple drinks/month   Drug use: No   Sexual activity: Not Currently  Other Topics Concern   Not on file  Social History Narrative   ** Merged History Encounter **       Social Determinants of Health   Financial Resource Strain: Not on file  Food Insecurity: Not on file  Transportation Needs: Not on file  Physical Activity: Not on file  Stress: Not on file  Social Connections: Not on file  Intimate Partner Violence: Not on file     Review of Systems    General:  No chills, fever, night sweats or weight changes.  Cardiovascular:  No chest pain, edema, orthopnea, palpitations, paroxysmal nocturnal.  Dermatological: No rash, lesions/masses Respiratory: No cough Urologic: No hematuria, dysuria Abdominal:   No nausea, vomiting, diarrhea, bright red blood per rectum, melena, or hematemesis Neurologic:  No visual changes, wkns, changes in mental status. All other systems reviewed and are otherwise negative except as noted above.  Physical Exam    VS:  BP 110/84 (BP Location: Left Arm, Patient Position: Sitting, Cuff Size: Normal)   Pulse 68   Ht 6' (1.829 m)   Wt 188 lb 9.6 oz (85.5 kg)   SpO2 Marland Kitchen)  84% Comment: Pt is wearing oxgyen  BMI 25.58 kg/m  , BMI Body mass index is 25.58 kg/m. GEN: Well nourished, well developed, in no acute distress. HEENT: normal. Neck: Supple, no JVD, carotid bruits, or masses. Cardiac: RRR, no murmurs, rubs, or gallops. No clubbing, cyanosis, edema.  Radials/DP/PT 2+ and equal bilaterally.  Respiratory:  Respirations regular and unlabored, clear to auscultation bilaterally. GI: Soft, nontender, nondistended, BS + x 4. MS: no deformity or atrophy. Skin: warm and dry, no rash. Neuro:  Strength and sensation are  intact. Psych: Normal affect.  Accessory Clinical Findings    Recent Labs: 12/20/2021: NT-Pro BNP 430 11/25/2022: ALT 23; BUN 17; Creatinine, Ser 0.89; Hemoglobin 15.8; Platelets 320; Potassium 3.4; Sodium 138   Recent Lipid Panel    Component Value Date/Time   CHOL 97 10/04/2020 0235   CHOL 131 02/19/2020 0820   TRIG 46 10/04/2020 0235   HDL 35 (L) 10/04/2020 0235   HDL 45 02/19/2020 0820   CHOLHDL 2.8 10/04/2020 0235   VLDL 9 10/04/2020 0235   LDLCALC 53 10/04/2020 0235   LDLCALC 73 02/19/2020 0820         ECG personally reviewed by me today- No EKG in office    Echocardiogram 05/19/2021  1. Left ventricular ejection fraction, by estimation, is 60 to 65%. The  left ventricle has normal function. The left ventricle has no regional  wall motion abnormalities. Left ventricular diastolic parameters are  indeterminate.   2. Right ventricular systolic function is normal. The right ventricular  size is mildly enlarged. Tricuspid regurgitation signal is inadequate for  assessing PA pressure.   3. A small pericardial effusion is present. There is no evidence of  cardiac tamponade.   4. The mitral valve is grossly normal. No evidence of mitral valve  regurgitation. No evidence of mitral stenosis.   5. The aortic valve is tricuspid. Aortic valve regurgitation is not  visualized. No aortic stenosis is present.   Heart catheterization 10/05/2020  Severe single-vessel coronary artery disease with chronic total occlusion of the ostial RCA.  No significant disease noted in the left coronary artery. Patent SVG-RCA with patent mid-graft stent and moderate proximal/mid graft disease that is stable or minimally worse compared to prior catheterization in 2012. Normal left and right heart filling pressures. Low normal Fick cardiac output/index.      Assessment & Plan  Near-Syncope He denies any chest pain or tachycardia. No further events this week. Echo in 2023 reassuring. He does  have history of orthostasis, was somewhat hypotensive in the ED. Near syncopal event was likely attributed to dehydration and/or orthostatic hypotension.  No need for ischemic evaluation at this time. K 3.4 In ED, will repeat BMP today.  He was educated to limit alcohol use and consume 64 ounces of water daily.  Education on going from a sitting to standing position slowly and wearing compression stockings.  We spoke about not taking medications on a hefty stomach with food.   2.  Coronary Artery Disease - s/p stents and CABG to SVG-RCA No reoccurrence of angina Continue guideline directed medical therapy with Lipitor 80 mg, Carvedilol 6.25 mg twice daily  3. Hypertension Blood pressure is 110/84 in clinic.  Currently well-controlled.  Encouraged to take daily blood pressures at home.  Continue carvedilol 6.25 mg.  4.  Interstitial Lung Disease/Pulmonary Fibrosis Follows with pulmonary and pulmonary rehab.  Currently on 5 L nasal cannula at all times.  Advised to wear his oxygen when he goes out  to restaurants.  5.  Hyperlipidemia/Aortic Atherosclerosis His last LDL was 71 on 11/17/2022. Continue atorvastatin 80 mg. Continue low-sodium heart healthy diet and exercise as tolerated.  6.  History of PE/DVT Continue Eliquis 5 mg  Disposition: Please follow-up in 3 to 4 months with Dr. Bufford Buttner or myself.  [address] DNP, AGNP-C     12/01/2022, 4:21 PM [facility] [address] Office [phone] Fax [phone]    I spent 14 minutes examining this patient, reviewing medications, and using patient centered shared decision making involving her cardiac care.  Prior to her visit I spent greater than 20 minutes reviewing her past medical history,  medications, and prior cardiac tests.

## 2022-12-01 NOTE — Patient Instructions (Addendum)
Medication Instructions:  The current medical regimen is effective;  continue present plan and medications as directed. Please refer to the Current Medication list given to you today.  *If you need a refill on your cardiac medications before your next appointment, please call your pharmacy*  Lab Work: BMET TODAY If you have labs (blood work) drawn today and your tests are completely normal, you will receive your results only by:     MyChart Message (if you have MyChart) OR  A paper copy in the mail If you have any lab test that is abnormal or we need to change your treatment, we will call you to review the results.  Other Instructions INCREASE HYDRATION  MAKE SURE TO CONTINUE TO WEAR YOUR OXYGEN DECREASE ALCOHOL USE  Follow-Up: At [facility] you and your health needs are our priority.  As part of our continuing mission to provide you with exceptional heart care, we have created designated Provider Care Teams.  These Care Teams include your primary Cardiologist (physician) and Advanced Practice Providers (APPs -  Physician Assistants and Nurse Practitioners) who all work together to provide you with the care you need, when you need it.  Your next appointment:   03-16-2023   Provider:   Reatha Harps, MD

## 2022-12-02 DIAGNOSIS — R310 Gross hematuria: Secondary | ICD-10-CM | POA: Diagnosis not present

## 2022-12-02 DIAGNOSIS — N401 Enlarged prostate with lower urinary tract symptoms: Secondary | ICD-10-CM | POA: Diagnosis not present

## 2022-12-02 DIAGNOSIS — R3121 Asymptomatic microscopic hematuria: Secondary | ICD-10-CM | POA: Diagnosis not present

## 2022-12-02 DIAGNOSIS — R351 Nocturia: Secondary | ICD-10-CM | POA: Diagnosis not present

## 2022-12-02 LAB — BASIC METABOLIC PANEL
BUN/Creatinine Ratio: 19 (ref 10–24)
BUN: 17 mg/dL (ref 8–27)
CO2: 25 mmol/L (ref 20–29)
Calcium: 9.4 mg/dL (ref 8.6–10.2)
Chloride: 101 mmol/L (ref 96–106)
Creatinine, Ser: 0.88 mg/dL (ref 0.76–1.27)
Glucose: 94 mg/dL (ref 70–99)
Potassium: 4.6 mmol/L (ref 3.5–5.2)
Sodium: 140 mmol/L (ref 134–144)
eGFR: 91 mL/min/{1.73_m2} (ref >59)

## 2022-12-12 ENCOUNTER — Other Ambulatory Visit: Payer: Self-pay | Admitting: *Deleted

## 2022-12-12 DIAGNOSIS — I739 Peripheral vascular disease, unspecified: Secondary | ICD-10-CM

## 2022-12-12 DIAGNOSIS — I70212 Atherosclerosis of native arteries of extremities with intermittent claudication, left leg: Secondary | ICD-10-CM

## 2022-12-13 DIAGNOSIS — F331 Major depressive disorder, recurrent, moderate: Secondary | ICD-10-CM | POA: Diagnosis not present

## 2022-12-21 ENCOUNTER — Ambulatory Visit (INDEPENDENT_AMBULATORY_CARE_PROVIDER_SITE_OTHER)
Admission: RE | Admit: 2022-12-21 | Discharge: 2022-12-21 | Disposition: A | Discharge status: Home / Self Care | Payer: PPO | Source: Ambulatory Visit | Attending: Vascular Surgery

## 2022-12-21 ENCOUNTER — Ambulatory Visit: Payer: PPO | Admitting: Physician Assistant

## 2022-12-21 ENCOUNTER — Ambulatory Visit (HOSPITAL_COMMUNITY)
Admission: RE | Admit: 2022-12-21 | Discharge: 2022-12-21 | Disposition: A | Discharge status: Home / Self Care | Payer: PPO | Source: Ambulatory Visit | Attending: Vascular Surgery | Admitting: Vascular Surgery

## 2022-12-21 VITALS — BP 117/73 | HR 68 | Temp 97.8°F | Ht 72.0 in | Wt 189.3 lb

## 2022-12-21 DIAGNOSIS — I739 Peripheral vascular disease, unspecified: Secondary | ICD-10-CM | POA: Diagnosis not present

## 2022-12-21 DIAGNOSIS — I70212 Atherosclerosis of native arteries of extremities with intermittent claudication, left leg: Secondary | ICD-10-CM

## 2022-12-21 NOTE — Progress Notes (Signed)
Office Note   History of Present Illness   Richard Parrish is a 72 y.o. (Jul 08, 1950) male who presents for surveillance of PAD.  He has a history of left SFA and popliteal artery atherectomy and stenting in 2017 by Dr. Myra Gianotti.  This was done for lifestyle limiting claudication.  He returns today for follow-up.  He states he continues to do well after all of his major surgeries.  He underwent robotic lung resection for cancer in January 2023.  He continues to stay very active at home with his 3 dogs.  He likes to go explore in the woods, tend to his garden, and go fishing.  He denies any claudication, rest pain, or tissue loss.  He takes a daily Eliquis and statin.   Current Outpatient Medications  Medication Sig Dispense Refill   acetaminophen (TYLENOL) 500 MG tablet Take 1 tablet (500 mg total) by mouth every 6 (six) hours as needed for mild pain.     ALPRAZolam (XANAX) 0.5 MG tablet Take 0.5-1 tablets (0.25-0.5 mg total) by mouth 3 (three) times daily as needed for anxiety or sleep. 5 tablet 0   antiseptic oral rinse (BIOTENE) LIQD 15 mLs by Mouth Rinse route daily as needed for dry mouth.     atorvastatin (LIPITOR) 80 MG tablet TAKE 1 TABLET BY MOUTH EVERY DAY 90 tablet 3   budesonide (ENTOCORT EC) 3 MG 24 hr capsule Take 3 mg by mouth 3 (three) times daily.     carvedilol (COREG) 6.25 MG tablet Take 1 tablet (6.25 mg total) by mouth 2 (two) times daily. 180 tablet 3   cholecalciferol (VITAMIN D3) 25 MCG (1000 UNIT) tablet Take 1,000 Units by mouth in the morning.     diphenhydrAMINE (BENADRYL) 25 mg capsule Take 25 mg by mouth at bedtime as needed for sleep (for sleep).     ELIQUIS 5 MG TABS tablet TAKE 1 TABLET BY MOUTH TWICE A DAY 180 tablet 1   guaiFENesin (MUCINEX) 600 MG 12 hr tablet Take 1 tablet (600 mg total) by mouth 2 (two) times daily as needed.     HYDROcodone-acetaminophen (NORCO) 7.5-325 MG tablet Take 1 tablet by mouth every 4 (four) hours as needed.     hyoscyamine  (LEVSIN, ANASPAZ) 0.125 MG tablet Take 0.125 mg by mouth every 4 (four) hours as needed for cramping.      nitroGLYCERIN (NITROSTAT) 0.4 MG SL tablet Place 1 tablet (0.4 mg total) under the tongue every 5 (five) minutes as needed for chest pain. 25 tablet 8   pantoprazole (PROTONIX) 40 MG tablet TAKE 1 TABLET BY MOUTH EVERY DAY BEFORE BREAKFAST 90 tablet 3   PARoxetine (PAXIL) 40 MG tablet Take 40 mg by mouth in the morning.  6   Pirfenidone 801 MG TABS Take 1 tablet (801 mg total) by mouth 3 (three) times daily with meals. ([email or web address]) 90 tablet 5   Probiotic Product (PROBIOTIC PO) Take 1 capsule by mouth daily.     sodium chloride (OCEAN) 0.65 % SOLN nasal spray Place 1 spray into both nostrils daily as needed for congestion.     tamsulosin (FLOMAX) 0.4 MG CAPS capsule Take 0.4 mg by mouth 2 (two) times daily.     triamcinolone cream (KENALOG) 0.1 % Apply 1 Application topically 2 (two) times daily.     No current facility-administered medications for this visit.    REVIEW OF SYSTEMS (negative unless checked):   Cardiac:  []  Chest pain or chest pressure? []  Shortness of  breath upon activity? []  Shortness of breath when lying flat? []  Irregular heart rhythm?  Vascular:  []  Pain in calf, thigh, or hip brought on by walking? []  Pain in feet at night that wakes you up from your sleep? []  Blood clot in your veins? []  Leg swelling?  Pulmonary:  []  Oxygen at home? []  Productive cough? []  Wheezing?  Neurologic:  []  Sudden weakness in arms or legs? []  Sudden numbness in arms or legs? []  Sudden onset of difficult speaking or slurred speech? []  Temporary loss of vision in one eye? []  Problems with dizziness?  Gastrointestinal:  []  Blood in stool? []  Vomited blood?  Genitourinary:  []  Burning when urinating? []  Blood in urine?  Psychiatric:  []  Major depression  Hematologic:  []  Bleeding problems? []  Problems with blood clotting?  Dermatologic:  []  Rashes or  ulcers?  Constitutional:  []  Fever or chills?  Ear/Nose/Throat:  []  Change in hearing? []  Nose bleeds? []  Sore throat?  Musculoskeletal:  []  Back pain? []  Joint pain? []  Muscle pain?   Physical Examination   Vitals:   12/21/22 1411  BP: 117/73  Pulse: 68  Temp: 97.8 F (36.6 C)  SpO2: 94%  Weight: 189 lb 4.8 oz (85.9 kg)  Height: 6' (1.829 m)   Body mass index is 25.67 kg/m.  General:  WDWN in NAD; vital signs documented above Gait: Not observed HENT: WNL, normocephalic Pulmonary: normal non-labored breathing, on supplemental oxygen Cardiac: regular Abdomen: soft, NT, no masses Skin: without rashes Vascular Exam/Pulses: 2+ bilateral DP/PT pulses Extremities: without ischemic changes, without gangrene , without cellulitis; without open wounds;  Musculoskeletal: no muscle wasting or atrophy  Neurologic: A&O X 3;  No focal weakness or paresthesias are detected Psychiatric:  The pt has Normal affect.  Non-Invasive Vascular Imaging ABI (12/21/2022) R:  ABI: 0.95 (1.02),  PT: tri DP: tri TBI:  0.74 L:  ABI: 1.05 (1.11),  PT: tri DP: tri TBI: 0.63  LLE Arterial Duplex (12/21/2022) Patent left SFA-popliteal stent without stenosis    Medical Decision Making   Richard Parrish is a 72 y.o. male who presents for surveillance of PAD  Based on the patient's vascular studies and examination, his ABIs are essentially unchanged.  His right ABI 0.95 and left ABI is 1.05 Left lower extremity arterial duplex demonstrates a patent left SFA-popliteal artery stent without stenosis The patient stays very active at home.  He denies any claudication, rest pain, or tissue loss On exam he has easily palpable DP/PT pulses.  He still takes a daily Eliquis and statin He can follow-up with our office in 1 year with repeat left lower extremity arterial duplex and ABIs   Loel Dubonnet PA-C [facility] Office: [phone]  Clinic MD: Randie Heinz

## 2022-12-22 ENCOUNTER — Ambulatory Visit: Payer: PPO | Admitting: Pulmonary Disease

## 2022-12-22 ENCOUNTER — Encounter: Payer: Self-pay | Admitting: Pulmonary Disease

## 2022-12-22 VITALS — BP 118/70 | HR 68 | Temp 97.9°F | Ht 72.0 in | Wt 190.2 lb

## 2022-12-22 DIAGNOSIS — J849 Interstitial pulmonary disease, unspecified: Secondary | ICD-10-CM

## 2022-12-22 DIAGNOSIS — Z5181 Encounter for therapeutic drug level monitoring: Secondary | ICD-10-CM | POA: Diagnosis not present

## 2022-12-22 DIAGNOSIS — J84112 Idiopathic pulmonary fibrosis: Secondary | ICD-10-CM | POA: Diagnosis not present

## 2022-12-22 LAB — VAS US ABI WITH/WO TBI
Left ABI: 1.05
Right ABI: 0.95

## 2022-12-22 NOTE — Progress Notes (Signed)
Richard Parrish    [id number]    Jun 11, 1950  Primary Care Physician:Polite, Windy Fast, MD  Referring Physician: [name], MD 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  with history of pulmonary fibrosis, GERD, hypertension, hyperlipidemia, pulmonary fibrosis He was diagnosed with unspecified pulmonary fibrosis and was seen in pulmonary clinic in 2015  Developed COVID-19 in March 2021 and reports worsening dyspnea since then.  Complains of chronic dyspnea on exertion, cough.  No fevers or chills  Imaging showed right upper lobe mass and he underwent combined bronchoscopy with Dr. Tonia Brooms and subsequently right upper lobe robotic resection by Dr. Cliffton Asters on 03/03/2021.  Saw Dr. Arbutus Ped from oncology on 03/24/2021 and no further chemotherapy recommended  Postsurgery he had recurrent issues pneumothorax and mildly exacerbation Treated in Jan with levofloxacin and prednisone. He had needle decompression and chest tube placed with Heimlich valve and mini VAC for pneumothorax.  Hospitalized on 2/26 under the care of cardiothoracic surgery.  Discharged on amoxicillin and additional prednisone taper.  Hospitalized in March 2023 with submassive pulmonary embolism, left lower extremity DVT.  Started on Eliquis.  He was also started on prednisone for ILD exacerbation Started Esbriet in May 2023.  So far is tolerating it well without any problem. He has tapered off prednisone in September 2023 Finished pulmonary rehab in September 2023.  Developed photosensitivity rash to Esbriet in 2024.  He took a break from the medication for a week and resumed it in early July 2024 .  Overall he feels that the rash is improving and has consulted with a dermatologist.  Pets:  3 cats Occupation: Used to work in Patent examiner at [employer]: Exposed to [employer] carrier. Smoking history: 30-pack-year smoker.  Quit in 1989.  Currently vapes which helps with his back. Travel history: No significant recent travel history Relevant family history: No family history of lung disease  Interim history: Discussed the use of AI scribe software for clinical note transcription with the patient, who gave verbal consent to proceed.  The patient, with a history of interstitial lung disease (IPF) and lung cancer, reports an overall improvement in his physical strength and activity level. He has been able to walk more and engage in outdoor activities, such as hunting and nature study. He reports that he is not yet able to play golf, but he has noticed an improvement in his ability to walk longer distances. He also mentions that he has been pushing his physical limits slightly to help improve his strength and endurance.  The patient is currently on Esbriet for his IPF. Earlier this year, he experienced a photosensitivity reaction to the medication, which caused his skin to peel and become red. Despite this, he has chosen to continue with the medication due to its lower cost compared to an alternative medication, Ofev. He manages the photosensitivity by applying a cream and avoiding sun exposure as much as possible.  In addition to his lung conditions, the patient also reports a recent episode of hematuria. He is currently under the care of a urologist, who has scheduled a CT scan of the bladder to investigate the cause of the hematuria.   Outpatient Encounter Medications as of 12/22/2022  Medication Sig   acetaminophen (TYLENOL) 500  MG tablet Take 1 tablet (500 mg total) by mouth every 6 (six) hours as needed for mild pain.   ALPRAZolam (XANAX) 0.5 MG tablet Take 0.5-1 tablets (0.25-0.5 mg total) by mouth 3 (three) times daily as needed for anxiety or sleep.   antiseptic  oral rinse (BIOTENE) LIQD 15 mLs by Mouth Rinse route daily as needed for dry mouth.   atorvastatin (LIPITOR) 80 MG tablet TAKE 1 TABLET BY MOUTH EVERY DAY   budesonide (ENTOCORT EC) 3 MG 24 hr capsule Take 3 mg by mouth daily.   carvedilol (COREG) 6.25 MG tablet Take 1 tablet (6.25 mg total) by mouth 2 (two) times daily.   cholecalciferol (VITAMIN D3) 25 MCG (1000 UNIT) tablet Take 1,000 Units by mouth in the morning.   diphenhydrAMINE (BENADRYL) 25 mg capsule Take 25 mg by mouth at bedtime as needed for sleep (for sleep).   ELIQUIS 5 MG TABS tablet TAKE 1 TABLET BY MOUTH TWICE A DAY   guaiFENesin (MUCINEX) 600 MG 12 hr tablet Take 1 tablet (600 mg total) by mouth 2 (two) times daily as needed.   HYDROcodone-acetaminophen (NORCO) 7.5-325 MG tablet Take 1 tablet by mouth every 4 (four) hours as needed.   hyoscyamine (LEVSIN, ANASPAZ) 0.125 MG tablet Take 0.125 mg by mouth every 4 (four) hours as needed for cramping.    nitroGLYCERIN (NITROSTAT) 0.4 MG SL tablet Place 1 tablet (0.4 mg total) under the tongue every 5 (five) minutes as needed for chest pain.   pantoprazole (PROTONIX) 40 MG tablet TAKE 1 TABLET BY MOUTH EVERY DAY BEFORE BREAKFAST   PARoxetine (PAXIL) 40 MG tablet Take 40 mg by mouth in the morning.   Pirfenidone 801 MG TABS Take 1 tablet (801 mg total) by mouth 3 (three) times daily with meals. ([email or web address])   Probiotic Product (PROBIOTIC PO) Take 1 capsule by mouth daily.   sodium chloride (OCEAN) 0.65 % SOLN nasal spray Place 1 spray into both nostrils daily as needed for congestion.   tamsulosin (FLOMAX) 0.4 MG CAPS capsule Take 0.4 mg by mouth 2 (two) times daily.   triamcinolone cream (KENALOG) 0.1 % Apply 1 Application topically 2 (two) times daily.   No facility-administered encounter medications on file as of 12/22/2022.    Physical Exam: Blood pressure 118/70, pulse 68, temperature 97.9 F (36.6 C), temperature source Temporal, height 6' (1.829 m), weight 190 lb  3.2 oz (86.3 kg), SpO2 95%. Gen:      No acute distress HEENT:  EOMI, sclera anicteric Neck:     No masses; no thyromegaly Lungs:    Bibasal crackles CV:         Regular rate and rhythm; no murmurs Abd:      + bowel sounds; soft, non-tender; no palpable masses, no distension Ext:    No edema; adequate peripheral perfusion Skin:      Warm and dry; no rash Neuro: alert and oriented x 3 Psych: normal mood and affect   Data Reviewed: Imaging: CT chest 07/18/2013-interstitial fibrosis with traction bronchiectasis with basilar predominance, emphysema.  Probable UIP pattern CT abdomen pelvis 02/07/17- basal pulmonary fibrosis. CT high-res 12/18/2020-new spiculated lung nodule in the right upper lobe PET scan 01/05/2021-uptake in the right upper lobe, equivocal uptake in the right hilum.  Left sphenoid sinus disease. Chest x-ray 04/01/2021-dense right lower lobe infiltrate CTA 04/13/2021-no PE, large right pneumothorax CTA 05/18/2021-left-sided pulmonary embolism, worsening groundglass opacities, airspace disease-right greater than left. High-resolution CT 07/13/21 - resolution of consolidation and asbestos disease. Worsening pattern  of pulmonary fibrosis in UIP pattern CT chest 09/17/2021-stable pattern of pulmonary fibrosis CT chest 03/17/2022-stable findings of pulmonary fibrosis. CT chest 09/12/2022-stable findings of pulmonary fibrosis. I have reviewed the images personally  PFTs: 11/27/2020 FVC 4.03 [85%], FEV1 2.95 [84%], F/F 73, TLC 6.09 [81%], DLCO 20.01 [73%] Minimal diffusion defect.  Labs: CTD serologies 11/26/2020-P ANCA 1:80  Cardiac: Cardiac cath 10/05/2020-normal left and right heart filling pressures.  Single-vessel coronary artery disease  Echocardiogram 05/19/2021- LVEF 60-65%, RV systolic function is normal with mild enlargement.  TR is signal is inadequate for measuring PA pressure.  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 Elevation  in ANCA is likely nonspecific as he has no signs of connective tissue disease.  I reviewed his biopsy with Dr. 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  Overall presentation is consistent with IPF  Started Pirfenidone in May 2023.   Stable disease with improved physical activity and strength. Patient is on Pirfenidone with photosensitivity reaction. Hepatic panel in sept 2024 are ok  -Continue Pirfenidone, with caution to avoid sun exposure and use sun protection. -Consider alternative medication (Nintedanib) if photosensitivity becomes intolerable. -Annual CT scans to monitor disease progression.  Lower extremity edema Has intermittent lower extremity edema and is on diuretics Echo and cardiac cath within the last year reviewed with no significant pulmonary hypertension.  Continue monitoring  Recurrent right pneumothorax Resolved on repeat imaging  Right upper lobe lung adenocarcinoma S/p bronchoscopy and lobectomy Continue surveillance CT scan  Submassive PE Continue Eliquis  Hematuria Recent episode of gross hematuria, currently resolved. -CT scan of bladder planned with urologist.  General Health Maintenance -Ensure up-to-date with vaccinations, including COVID-19, influenza, and RSV. -Follow-up in 6 months.    Plan/Recommendations: Continue Esbriet Sun protection measures Continue monitoring labs Exercise at home  Chilton Greathouse MD [facility] 12/22/2022, 11:19 AM  CC: [name], MD

## 2022-12-22 NOTE — Patient Instructions (Signed)
VISIT SUMMARY:  During today's visit, we discussed your overall improvement in physical strength and activity levels despite your ongoing treatment for interstitial lung disease (IPF) and lung cancer. We also addressed your recent episode of hematuria and your management plan for photosensitivity due to your medication.  YOUR PLAN:  -IDIOPATHIC PULMONARY FIBROSIS (IPF): IPF is a chronic lung disease that causes scarring of the lungs, making it difficult to breathe. You are currently taking Pirfenidone (Esbriet) to manage this condition. Continue taking this medication while avoiding sun exposure and using sun protection to manage photosensitivity. If the photosensitivity becomes too difficult to handle, we may consider switching to an alternative medication, Nintedanib (Ofev). We will also continue with annual CT scans to monitor the progression of your disease.  -HEMATURIA: Hematuria is the presence of blood in the urine. You recently experienced an episode of gross hematuria, which has now resolved. A CT scan of your bladder is planned with your urologist to investigate the cause.  -GENERAL HEALTH MAINTENANCE: It is important to stay up-to-date with your vaccinations, including COVID-19, influenza, and RSV. We will follow up in 6 months to review your overall health and any new developments.  INSTRUCTIONS:  Please continue to avoid sun exposure and use sun protection while taking Pirfenidone. Ensure you are up-to-date with your vaccinations. Follow up with [place] in 6 months.

## 2022-12-27 ENCOUNTER — Ambulatory Visit: Payer: PPO | Admitting: Cardiology

## 2022-12-27 DIAGNOSIS — J84112 Idiopathic pulmonary fibrosis: Secondary | ICD-10-CM | POA: Diagnosis not present

## 2022-12-29 ENCOUNTER — Other Ambulatory Visit: Payer: Self-pay

## 2022-12-29 MED ORDER — ATORVASTATIN CALCIUM 80 MG PO TABS: 80.0000 mg | ORAL_TABLET | Freq: Every day | ORAL | 3 refills | Status: DC

## 2023-01-08 IMAGING — CT CT CHEST HIGH RESOLUTION W/O CM
1 of 4 series · 14 of 32 positions shown, 18 images · non-contrast
Comparison: 07/18/2013.

CLINICAL DATA: Pulmonary fibrosis, shortness of breath for years.
History of COVID in 9292.

EXAM:
CT CHEST WITHOUT CONTRAST
TECHNIQUE: Multidetector CT imaging of the chest was performed following the
standard protocol without intravenous contrast. High resolution
imaging of the lungs, as well as inspiratory and expiratory imaging,
was performed.

[Series 8: super d · axial · 0.71mm/px · z∈[-328,-55]mm · 14 of 506 slices shown, 18 images]
[im 26/506  mediastinal]
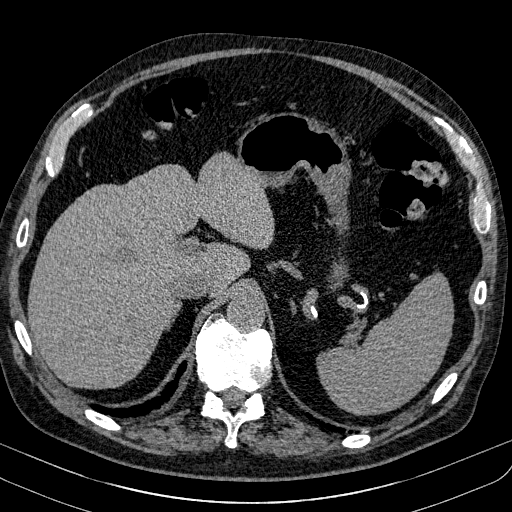
[im 26/506  lung]
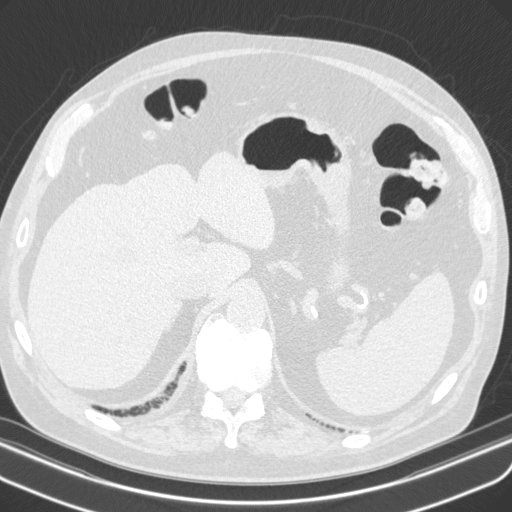
[im 76/506  lung]
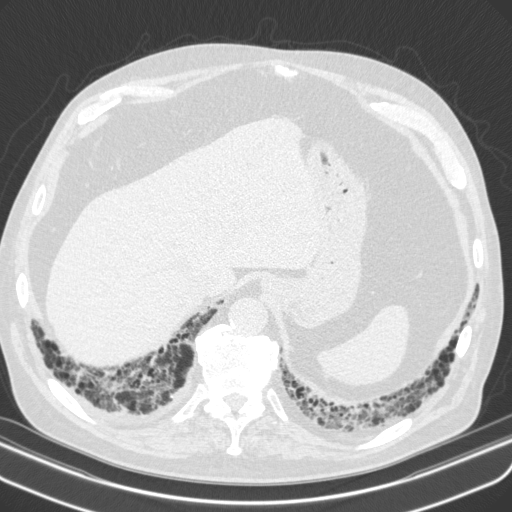
[im 102/506  lung]
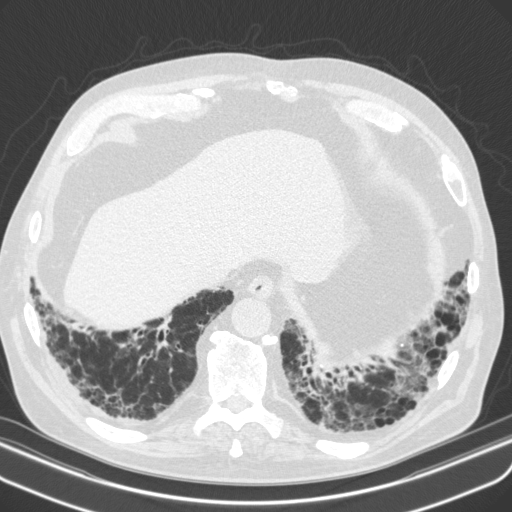
[im 152/506  lung]
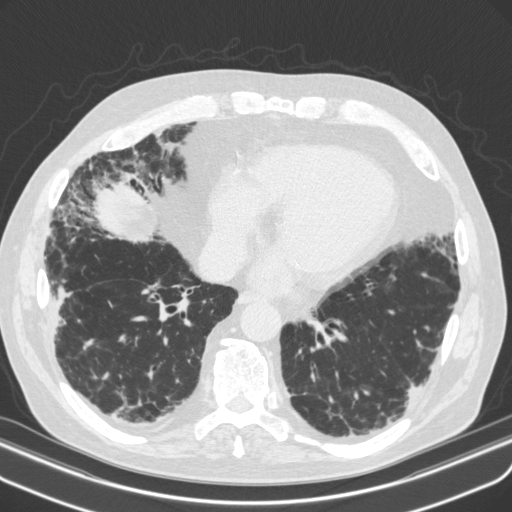
[im 177/506  mediastinal]
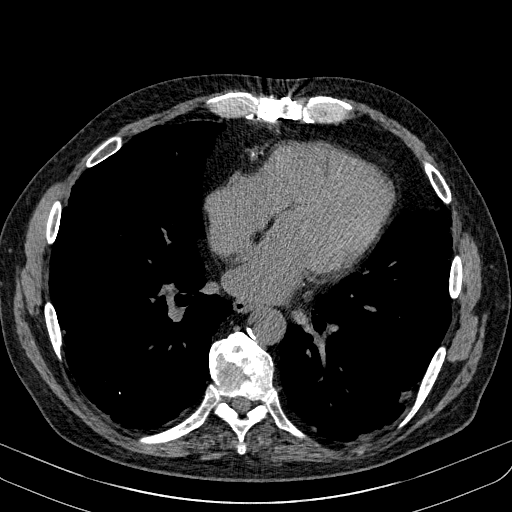
[im 177/506  lung]
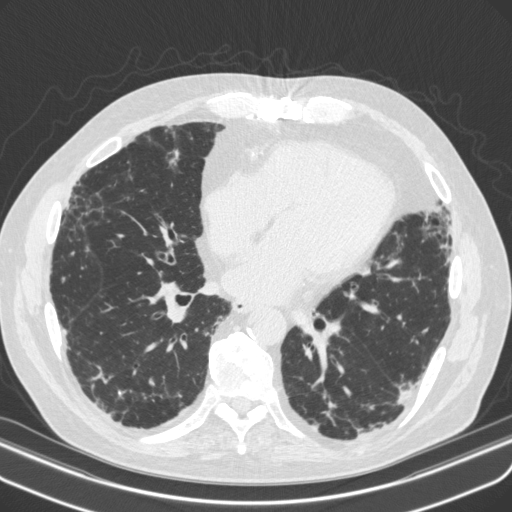
[im 203/506  lung]
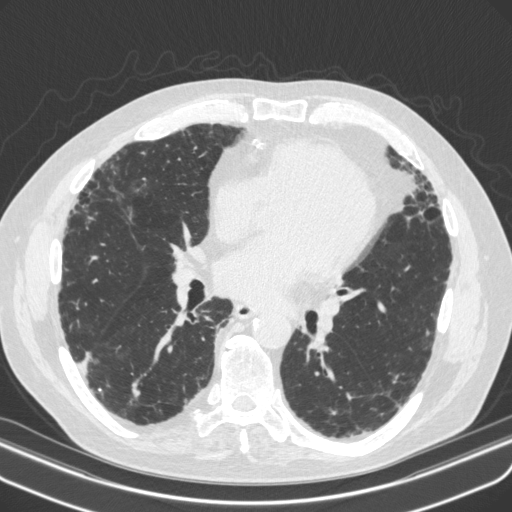
[im 237/506  lung]
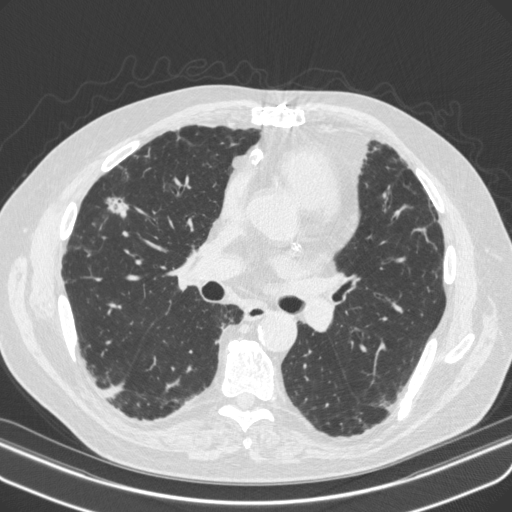
[im 253/506  lung]
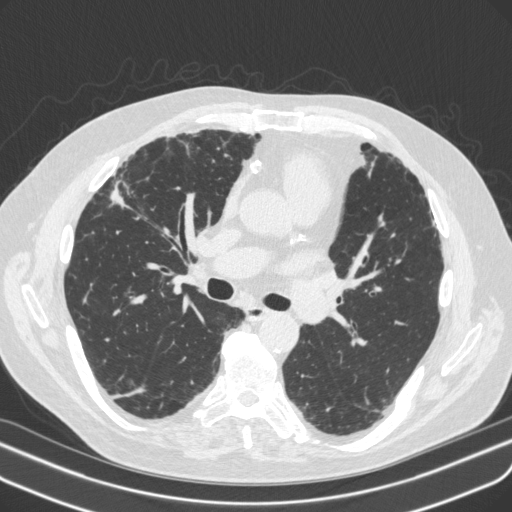
[im 304/506  mediastinal]
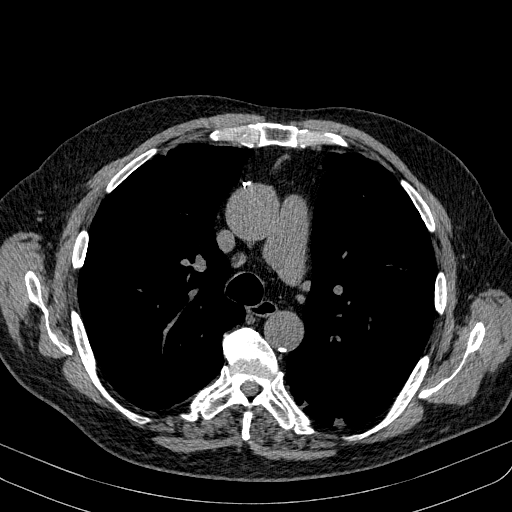
[im 304/506  lung]
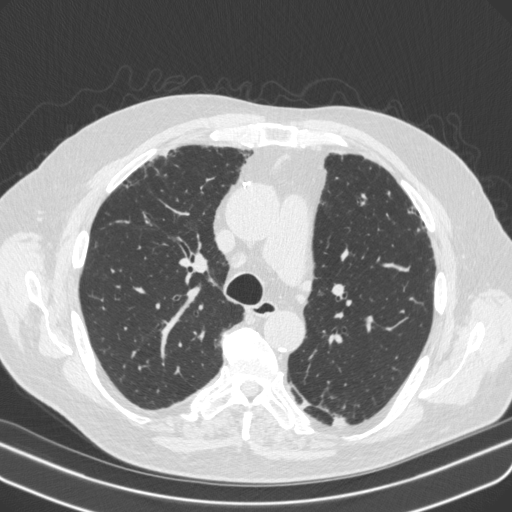
[im 329/506  lung]
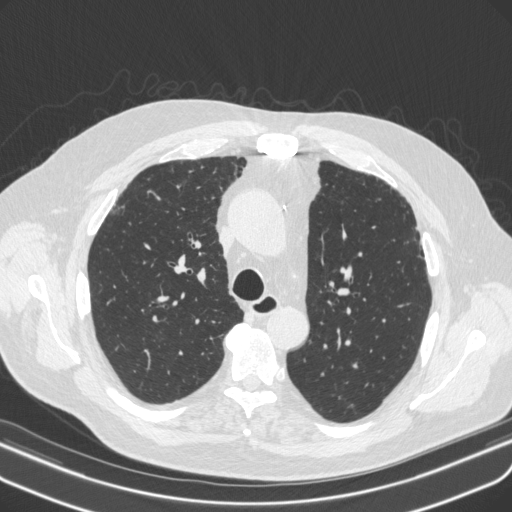
[im 379/506  lung]
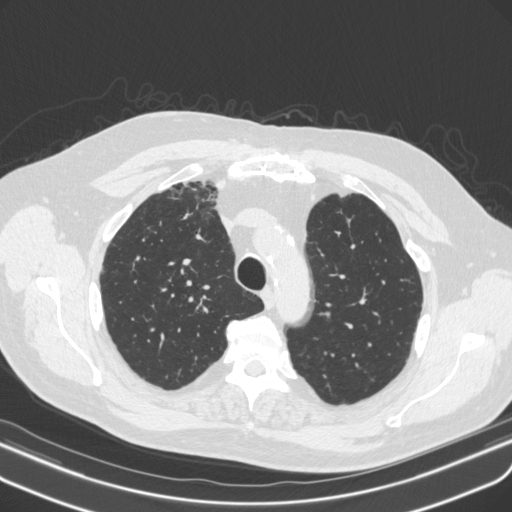
[im 405/506  lung]
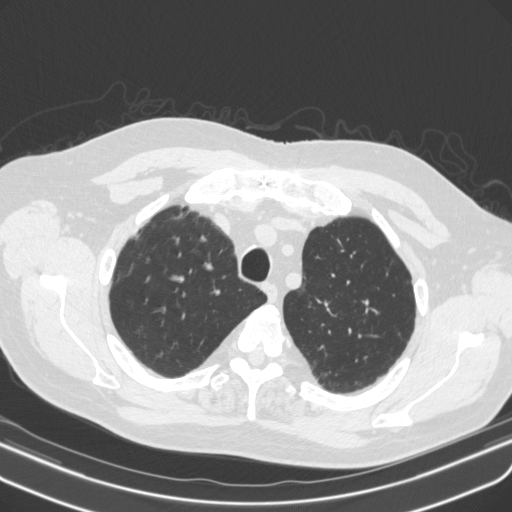
[im 430/506  mediastinal]
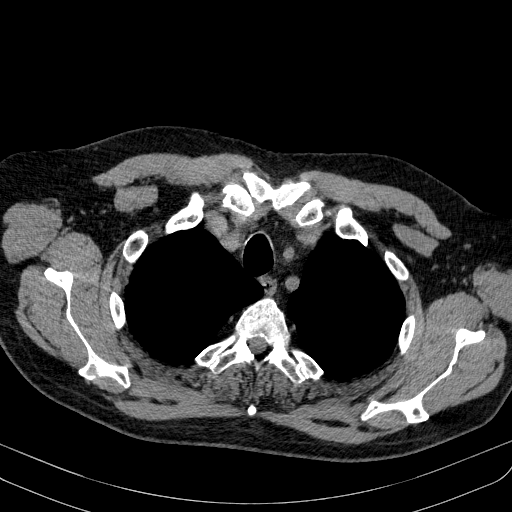
[im 430/506  lung]
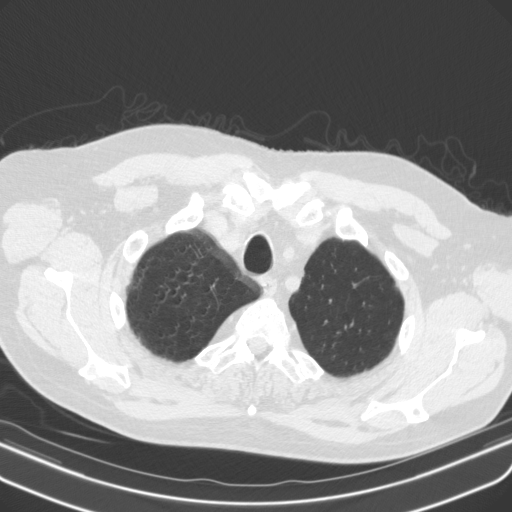
[im 480/506  lung]
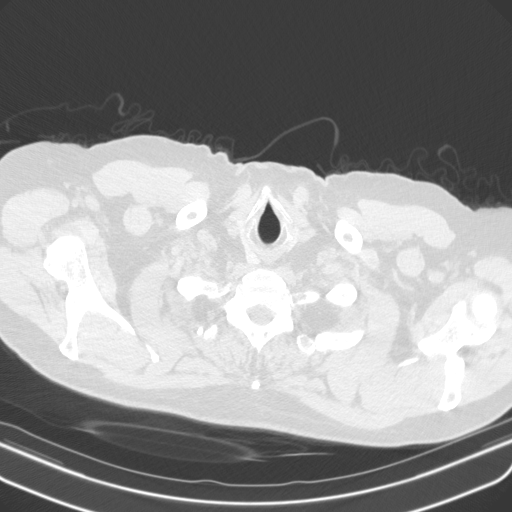

[14 of 32 positions shown; findings below may reference images not displayed]

FINDINGS: Cardiovascular: Atherosclerotic calcification of the aorta. Pulmonic
trunk is at the upper limits of normal in size as is the heart. No
pericardial effusion.

Mediastinum/Nodes: 12 mm low right paratracheal lymph node,
increased slightly from 10 mm on 07/18/2013. Hilar regions are
difficult to evaluate without IV contrast. No axillary adenopathy.
Esophagus is unremarkable.

Lungs/Pleura: Image quality is degraded by respiratory motion. New
spiculated nodule in the anterior segment right upper lobe measures
2.1 x 2.2 cm (7/80). Peripheral and basilar predominant subpleural
reticulation, ground-glass, traction bronchiectasis/bronchiolectasis
and honeycombing, progressive from 07/18/2013. Trace right pleural
fluid or pleural thickening. Post biopsy scarring in the inferior
left lower lobe. Airway is unremarkable. No air trapping.

Upper Abdomen: Visualized portions of the liver, adrenal glands,
spleen, pancreas, stomach and bowel are grossly unremarkable.
Cholecystectomy.

Musculoskeletal: Degenerative changes in the spine. Old left rib
fractures. No worrisome lytic or sclerotic lesions.
IMPRESSION: 1. New spiculated right upper lobe nodule is most indicative of
primary bronchogenic carcinoma. Difficult to exclude a low right
paratracheal nodal metastasis. These results will be called to the
ordering clinician or representative by the Radiologist Assistant,
and communication documented in the PACS or [REDACTED].
2. Mild progression in pulmonary fibrosis. Findings are consistent
with UIP per consensus guidelines: Diagnosis of Idiopathic Pulmonary
Fibrosis: An Official ATS/ERS/JRS/ALAT Clinical Practice Guideline.
Am J Respir Crit Care Med Vol 198, Tiger 5, ppe33-e[DATE].
3. Trace right pleural fluid/thickening.
4.  Aortic atherosclerosis ([id number].B).
5.

## 2023-01-10 ENCOUNTER — Other Ambulatory Visit: Payer: Self-pay

## 2023-01-10 DIAGNOSIS — I739 Peripheral vascular disease, unspecified: Secondary | ICD-10-CM

## 2023-01-10 DIAGNOSIS — R319 Hematuria, unspecified: Secondary | ICD-10-CM | POA: Diagnosis not present

## 2023-01-10 DIAGNOSIS — R310 Gross hematuria: Secondary | ICD-10-CM | POA: Diagnosis not present

## 2023-01-10 DIAGNOSIS — N201 Calculus of ureter: Secondary | ICD-10-CM | POA: Diagnosis not present

## 2023-01-10 DIAGNOSIS — R161 Splenomegaly, not elsewhere classified: Secondary | ICD-10-CM | POA: Diagnosis not present

## 2023-01-17 DIAGNOSIS — L560 Drug phototoxic response: Secondary | ICD-10-CM | POA: Diagnosis not present

## 2023-01-18 ENCOUNTER — Other Ambulatory Visit: Payer: Self-pay

## 2023-01-18 ENCOUNTER — Inpatient Hospital Stay (HOSPITAL_BASED_OUTPATIENT_CLINIC_OR_DEPARTMENT_OTHER)
Admission: EM | Admit: 2023-01-18 | Discharge: 2023-01-23 | DRG: 196 | Disposition: A | Discharge status: Home / Self Care | Payer: PPO | Attending: Family Medicine | Admitting: Family Medicine

## 2023-01-18 ENCOUNTER — Emergency Department (HOSPITAL_BASED_OUTPATIENT_CLINIC_OR_DEPARTMENT_OTHER): Payer: PPO | Admitting: Radiology

## 2023-01-18 ENCOUNTER — Observation Stay (HOSPITAL_COMMUNITY): Payer: PPO

## 2023-01-18 ENCOUNTER — Encounter (HOSPITAL_COMMUNITY): Payer: Self-pay | Admitting: Student

## 2023-01-18 ENCOUNTER — Emergency Department (HOSPITAL_BASED_OUTPATIENT_CLINIC_OR_DEPARTMENT_OTHER): Payer: PPO

## 2023-01-18 ENCOUNTER — Ambulatory Visit (HOSPITAL_BASED_OUTPATIENT_CLINIC_OR_DEPARTMENT_OTHER): Payer: PPO | Admitting: Pulmonary Disease

## 2023-01-18 ENCOUNTER — Encounter (HOSPITAL_BASED_OUTPATIENT_CLINIC_OR_DEPARTMENT_OTHER): Payer: Self-pay | Admitting: Pulmonary Disease

## 2023-01-18 VITALS — BP 80/58 | HR 82 | Resp 24 | Ht 72.0 in | Wt 187.0 lb

## 2023-01-18 DIAGNOSIS — J84112 Idiopathic pulmonary fibrosis: Secondary | ICD-10-CM | POA: Diagnosis not present

## 2023-01-18 DIAGNOSIS — J984 Other disorders of lung: Secondary | ICD-10-CM | POA: Diagnosis not present

## 2023-01-18 DIAGNOSIS — Z888 Allergy status to other drugs, medicaments and biological substances status: Secondary | ICD-10-CM | POA: Diagnosis not present

## 2023-01-18 DIAGNOSIS — Z955 Presence of coronary angioplasty implant and graft: Secondary | ICD-10-CM | POA: Diagnosis not present

## 2023-01-18 DIAGNOSIS — K219 Gastro-esophageal reflux disease without esophagitis: Secondary | ICD-10-CM | POA: Diagnosis not present

## 2023-01-18 DIAGNOSIS — Z902 Acquired absence of lung [part of]: Secondary | ICD-10-CM

## 2023-01-18 DIAGNOSIS — F419 Anxiety disorder, unspecified: Secondary | ICD-10-CM | POA: Diagnosis present

## 2023-01-18 DIAGNOSIS — J9621 Acute and chronic respiratory failure with hypoxia: Secondary | ICD-10-CM

## 2023-01-18 DIAGNOSIS — R590 Localized enlarged lymph nodes: Secondary | ICD-10-CM | POA: Diagnosis not present

## 2023-01-18 DIAGNOSIS — F32A Depression, unspecified: Secondary | ICD-10-CM | POA: Diagnosis present

## 2023-01-18 DIAGNOSIS — R918 Other nonspecific abnormal finding of lung field: Secondary | ICD-10-CM | POA: Diagnosis not present

## 2023-01-18 DIAGNOSIS — E861 Hypovolemia: Secondary | ICD-10-CM

## 2023-01-18 DIAGNOSIS — N400 Enlarged prostate without lower urinary tract symptoms: Secondary | ICD-10-CM | POA: Diagnosis not present

## 2023-01-18 DIAGNOSIS — C3411 Malignant neoplasm of upper lobe, right bronchus or lung: Secondary | ICD-10-CM | POA: Diagnosis present

## 2023-01-18 DIAGNOSIS — J9620 Acute and chronic respiratory failure, unspecified whether with hypoxia or hypercapnia: Secondary | ICD-10-CM | POA: Diagnosis present

## 2023-01-18 DIAGNOSIS — Z86711 Personal history of pulmonary embolism: Secondary | ICD-10-CM | POA: Diagnosis not present

## 2023-01-18 DIAGNOSIS — G4733 Obstructive sleep apnea (adult) (pediatric): Secondary | ICD-10-CM | POA: Diagnosis present

## 2023-01-18 DIAGNOSIS — Z951 Presence of aortocoronary bypass graft: Secondary | ICD-10-CM

## 2023-01-18 DIAGNOSIS — Z981 Arthrodesis status: Secondary | ICD-10-CM

## 2023-01-18 DIAGNOSIS — Z8249 Family history of ischemic heart disease and other diseases of the circulatory system: Secondary | ICD-10-CM | POA: Diagnosis not present

## 2023-01-18 DIAGNOSIS — J90 Pleural effusion, not elsewhere classified: Secondary | ICD-10-CM | POA: Diagnosis not present

## 2023-01-18 DIAGNOSIS — Z79899 Other long term (current) drug therapy: Secondary | ICD-10-CM | POA: Diagnosis not present

## 2023-01-18 DIAGNOSIS — Z87891 Personal history of nicotine dependence: Secondary | ICD-10-CM

## 2023-01-18 DIAGNOSIS — Z85118 Personal history of other malignant neoplasm of bronchus and lung: Secondary | ICD-10-CM | POA: Diagnosis not present

## 2023-01-18 DIAGNOSIS — Z86718 Personal history of other venous thrombosis and embolism: Secondary | ICD-10-CM | POA: Diagnosis not present

## 2023-01-18 DIAGNOSIS — E785 Hyperlipidemia, unspecified: Secondary | ICD-10-CM | POA: Diagnosis not present

## 2023-01-18 DIAGNOSIS — D72810 Lymphocytopenia: Secondary | ICD-10-CM | POA: Diagnosis present

## 2023-01-18 DIAGNOSIS — G8929 Other chronic pain: Secondary | ICD-10-CM | POA: Diagnosis not present

## 2023-01-18 DIAGNOSIS — J189 Pneumonia, unspecified organism: Secondary | ICD-10-CM | POA: Diagnosis not present

## 2023-01-18 DIAGNOSIS — I2510 Atherosclerotic heart disease of native coronary artery without angina pectoris: Secondary | ICD-10-CM | POA: Diagnosis not present

## 2023-01-18 DIAGNOSIS — I252 Old myocardial infarction: Secondary | ICD-10-CM | POA: Diagnosis not present

## 2023-01-18 DIAGNOSIS — I959 Hypotension, unspecified: Secondary | ICD-10-CM | POA: Diagnosis present

## 2023-01-18 DIAGNOSIS — Z9981 Dependence on supplemental oxygen: Secondary | ICD-10-CM | POA: Diagnosis not present

## 2023-01-18 DIAGNOSIS — Z96643 Presence of artificial hip joint, bilateral: Secondary | ICD-10-CM | POA: Diagnosis present

## 2023-01-18 DIAGNOSIS — I2699 Other pulmonary embolism without acute cor pulmonale: Secondary | ICD-10-CM | POA: Diagnosis present

## 2023-01-18 DIAGNOSIS — I2782 Chronic pulmonary embolism: Secondary | ICD-10-CM | POA: Diagnosis not present

## 2023-01-18 DIAGNOSIS — Z7901 Long term (current) use of anticoagulants: Secondary | ICD-10-CM

## 2023-01-18 DIAGNOSIS — I10 Essential (primary) hypertension: Secondary | ICD-10-CM | POA: Diagnosis not present

## 2023-01-18 DIAGNOSIS — R0602 Shortness of breath: Secondary | ICD-10-CM | POA: Diagnosis not present

## 2023-01-18 DIAGNOSIS — R053 Chronic cough: Secondary | ICD-10-CM | POA: Diagnosis present

## 2023-01-18 LAB — CBC WITH DIFFERENTIAL/PLATELET
Abs Immature Granulocytes: 0.04 10*3/uL (ref 0.00–0.07)
Basophils Absolute: 0.1 10*3/uL (ref 0.0–0.1)
Basophils Relative: 1 %
Eosinophils Absolute: 0.2 10*3/uL (ref 0.0–0.5)
Eosinophils Relative: 3 %
HCT: 39.2 % (ref 39.0–52.0)
Hemoglobin: 13.3 g/dL (ref 13.0–17.0)
Immature Granulocytes: 1 %
Lymphocytes Relative: 4 %
Lymphs Abs: 0.3 10*3/uL — ABNORMAL LOW (ref 0.7–4.0)
MCH: 32.4 pg (ref 26.0–34.0)
MCHC: 33.9 g/dL (ref 30.0–36.0)
MCV: 95.4 fL (ref 80.0–100.0)
Monocytes Absolute: 1 10*3/uL (ref 0.1–1.0)
Monocytes Relative: 13 %
Neutro Abs: 6 10*3/uL (ref 1.7–7.7)
Neutrophils Relative %: 78 %
Platelets: 470 10*3/uL — ABNORMAL HIGH (ref 150–400)
RBC: 4.11 MIL/uL — ABNORMAL LOW (ref 4.22–5.81)
RDW: 11.8 % (ref 11.5–15.5)
WBC: 7.6 10*3/uL (ref 4.0–10.5)
nRBC: 0 % (ref 0.0–0.2)

## 2023-01-18 LAB — COMPREHENSIVE METABOLIC PANEL
ALT: 12 U/L (ref 0–44)
AST: 23 U/L (ref 15–41)
Albumin: 3.6 g/dL (ref 3.5–5.0)
Alkaline Phosphatase: 60 U/L (ref 38–126)
Anion gap: 8 (ref 5–15)
BUN: 17 mg/dL (ref 8–23)
CO2: 28 mmol/L (ref 22–32)
Calcium: 9.5 mg/dL (ref 8.9–10.3)
Chloride: 101 mmol/L (ref 98–111)
Creatinine, Ser: 0.74 mg/dL (ref 0.61–1.24)
GFR, Estimated: >60 mL/min (ref >60)
Glucose, Bld: 110 mg/dL — ABNORMAL HIGH (ref 70–99)
Potassium: 4 mmol/L (ref 3.5–5.1)
Sodium: 137 mmol/L (ref 135–145)
Total Bilirubin: 0.7 mg/dL (ref <1.2)
Total Protein: 6.7 g/dL (ref 6.5–8.1)

## 2023-01-18 LAB — PROCALCITONIN: Procalcitonin: <0.1 ng/mL

## 2023-01-18 LAB — RESPIRATORY PANEL BY PCR

## 2023-01-18 LAB — RESP PANEL BY RT-PCR (RSV, FLU A&B, COVID)  RVPGX2
Influenza A by PCR: NEGATIVE
Influenza B by PCR: NEGATIVE
Resp Syncytial Virus by PCR: NEGATIVE
SARS Coronavirus 2 by RT PCR: NEGATIVE

## 2023-01-18 LAB — LACTIC ACID, PLASMA
Lactic Acid, Venous: 1.3 mmol/L (ref 0.5–1.9)
Lactic Acid, Venous: 1 mmol/L (ref 0.5–1.9)

## 2023-01-18 MED ORDER — AZITHROMYCIN 250 MG PO TABS
500.0000 mg | ORAL_TABLET | Freq: Every day | ORAL | Status: AC
  Administered 2023-01-19 – 2023-01-20 (×2): 500 mg via ORAL
  Filled 2023-01-18 (×2): qty 2

## 2023-01-18 MED ORDER — TAMSULOSIN HCL 0.4 MG PO CAPS
0.4000 mg | ORAL_CAPSULE | Freq: Every day | ORAL | Status: DC
  Administered 2023-01-19 – 2023-01-22 (×4): 0.4 mg via ORAL
  Filled 2023-01-18 (×4): qty 1

## 2023-01-18 MED ORDER — SENNOSIDES-DOCUSATE SODIUM 8.6-50 MG PO TABS
1.0000 | ORAL_TABLET | Freq: Every evening | ORAL | Status: DC | PRN
Start: 2023-01-18 — End: 2023-01-23

## 2023-01-18 MED ORDER — PIRFENIDONE 801 MG PO TABS
1.0000 | ORAL_TABLET | Freq: Three times a day (TID) | ORAL | Status: DC
  Administered 2023-01-18 – 2023-01-23 (×15): 801 mg via ORAL
  Filled 2023-01-18 (×22): qty 1

## 2023-01-18 MED ORDER — ALBUTEROL SULFATE HFA 108 (90 BASE) MCG/ACT IN AERS: 2.0000 | INHALATION_SPRAY | RESPIRATORY_TRACT | Status: DC | PRN

## 2023-01-18 MED ORDER — SODIUM CHLORIDE 0.9 % IV SOLN
1.0000 g | Freq: Once | INTRAVENOUS | Status: AC
  Administered 2023-01-18: 1 g via INTRAVENOUS
  Filled 2023-01-18: qty 10

## 2023-01-18 MED ORDER — METHYLPREDNISOLONE SODIUM SUCC 125 MG IJ SOLR: 125.0000 mg | INTRAMUSCULAR | Status: DC

## 2023-01-18 MED ORDER — LACTATED RINGERS IV BOLUS
500.0000 mL | Freq: Once | INTRAVENOUS | Status: AC
  Administered 2023-01-18: 500 mL via INTRAVENOUS

## 2023-01-18 MED ORDER — APIXABAN 5 MG PO TABS
5.0000 mg | ORAL_TABLET | Freq: Two times a day (BID) | ORAL | Status: DC
  Administered 2023-01-18 – 2023-01-23 (×10): 5 mg via ORAL
  Filled 2023-01-18 (×10): qty 1

## 2023-01-18 MED ORDER — METHYLPREDNISOLONE SODIUM SUCC 125 MG IJ SOLR
125.0000 mg | Freq: Once | INTRAMUSCULAR | Status: AC
  Administered 2023-01-18: 125 mg via INTRAVENOUS
  Filled 2023-01-18: qty 2

## 2023-01-18 MED ORDER — SODIUM CHLORIDE 0.9 % IV SOLN
500.0000 mg | Freq: Once | INTRAVENOUS | Status: AC
  Administered 2023-01-18: 500 mg via INTRAVENOUS
  Filled 2023-01-18: qty 5

## 2023-01-18 MED ORDER — PIRFENIDONE 801 MG PO TABS: 1.0000 | ORAL_TABLET | Freq: Three times a day (TID) | ORAL | Status: DC

## 2023-01-18 MED ORDER — PAROXETINE HCL 20 MG PO TABS
20.0000 mg | ORAL_TABLET | Freq: Every day | ORAL | Status: DC
  Administered 2023-01-19 – 2023-01-23 (×5): 20 mg via ORAL
  Filled 2023-01-18 (×5): qty 1

## 2023-01-18 MED ORDER — ATORVASTATIN CALCIUM 40 MG PO TABS
80.0000 mg | ORAL_TABLET | Freq: Every day | ORAL | Status: DC
  Administered 2023-01-19 – 2023-01-23 (×5): 80 mg via ORAL
  Filled 2023-01-18 (×5): qty 2

## 2023-01-18 MED ORDER — ALBUTEROL SULFATE (2.5 MG/3ML) 0.083% IN NEBU: 2.5000 mg | INHALATION_SOLUTION | RESPIRATORY_TRACT | Status: DC | PRN

## 2023-01-18 MED ORDER — HYDROCODONE-ACETAMINOPHEN 5-325 MG PO TABS
1.0000 | ORAL_TABLET | ORAL | Status: DC | PRN
  Administered 2023-01-19 – 2023-01-23 (×8): 1 via ORAL
  Filled 2023-01-18 (×8): qty 1

## 2023-01-18 MED ORDER — SODIUM CHLORIDE 0.9 % IV SOLN
1.0000 g | INTRAVENOUS | Status: AC
  Administered 2023-01-19 – 2023-01-22 (×4): 1 g via INTRAVENOUS
  Filled 2023-01-18 (×4): qty 10

## 2023-01-18 MED ORDER — PANTOPRAZOLE SODIUM 40 MG PO TBEC
40.0000 mg | DELAYED_RELEASE_TABLET | Freq: Every day | ORAL | Status: DC
  Administered 2023-01-19 – 2023-01-23 (×5): 40 mg via ORAL
  Filled 2023-01-18 (×5): qty 1

## 2023-01-18 MED ORDER — TRIAMCINOLONE ACETONIDE 0.1 % EX CREA: 1.0000 | TOPICAL_CREAM | Freq: Two times a day (BID) | CUTANEOUS | Status: DC | PRN

## 2023-01-18 NOTE — Progress Notes (Signed)
Subjective:    Patient ID: Richard Parrish, male    DOB: Jul 16, 1950, 72 y.o.   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 O2 was in mid to upper 70s the two times he passed out. Has happened in the past.     Discussed the use of AI scribe software for clinical note transcription with the patient, who gave verbal consent to proceed.  History of Present Illness   The patient, with a history of idiopathic pulmonary fibrosis, lung cancer, and pulmonary embolism, presents with a recent exacerbation of dyspnea. Over the past weekend, he noticed a sudden onset of increased breathlessness, particularly during physical activities such as walking or lifting objects. The patient reports that he now becomes winded with less exertion than before. He also experienced an episode of near syncope while walking his dogs, necessitating the use of a wheelchair.  The patient is currently on oxygen therapy, using a setting of five on pulse mode when outside and five on continuous mode when at home. Despite this, he has noticed a drop in his oxygen saturation levels to the seventies during exertion, although it remains in the nineties at rest.  The patient also has a history of a blood clot following surgery last year and is currently on anticoagulant therapy. He reported an episode of hematuria in September, for which he underwent a CT scan, the results of which are pending.  The patient is currently on Esbriet for his pulmonary fibrosis, which he understands is a progressive condition that affects the oxygen transfer in his lungs. He also has a known allergy to sun, which he attributes to the Esbriet.  The patient  [place] the [place] and remains as active as possible despite his respiratory limitations. He has not reported any recent illnesses or changes in his overall health status.        Significant tests/ events reviewed  CT chest 09/12/2022-stable findings of pulmonary fibrosis.   11/27/2020 FVC 4.03 [85%], FEV1 2.95 [84%], F/F 73, TLC 6.09 [81%], DLCO 20.01 [73%] Minimal diffusion defect  Review of Systems neg for any significant sore throat, dysphagia, itching, sneezing, nasal congestion or excess/ purulent secretions, fever, chills, sweats, unintended wt loss, pleuritic or exertional cp, hempoptysis, orthopnea pnd or change in chronic leg swelling. Also denies presyncope, palpitations, heartburn, abdominal pain, nausea, vomiting, diarrhea or change in bowel or urinary habits, dysuria,hematuria, rash, arthralgias, visual complaints, headache, numbness weakness or ataxia.     Objective:   Physical Exam  Gen. Pleasant, well-nourished, in mild  distress, on POC, changed to 5L [place] cont ENT - no thrush, no pallor/icterus,no post nasal drip Neck: No JVD, no thyromegaly, no carotid bruits Lungs: no use of accessory muscles, no dullness to percussion, BL 1/3 dry rales Cardiovascular: Rhythm regular, heart sounds  normal, no murmurs or gallops, no peripheral edema Musculoskeletal: No deformities, no cyanosis or clubbing        Assessment & Plan:    Assessment and Plan    Ac on chronic resp failure with hypoxia Idiopathic Pulmonary Fibrosis (IPF) Acute worsening of dyspnea and hypoxia over the past few days. No clear  precipitating factors. Stable on previous CT scan. Currently on Esbriet. -Admit to hospital via emergency room for expedited workup including blood work, chest x-ray, and possibly repeat CT scan. -Consider high dose steroids if IPF flare is confirmed.  Anticoagulation History of pulmonary embolism, currently on blood thinner. Recent history of  hematuria. -Check coagulation status and hemoglobin level in the hospital to assess for possible bleeding complications.  Oxygen Therapy Increased oxygen requirements, currently on 5L continuous at home and 5L pulse dose when mobile. -Adjust oxygen therapy as needed based on hospital findings.  General Health Maintenance -Continue Esbriet for IPF management.      Hypotension due to bleeding - check CBC, will need IVFs  Called ED & spoke to Dr Rosalia Hammers

## 2023-01-18 NOTE — Patient Instructions (Signed)
xGO to ER for further evaluation

## 2023-01-18 NOTE — ED Triage Notes (Signed)
Pt via pov from home with increasing sob x 2 days. Pt has interstitial lung disease and pulmonary fibrosis and is on 5L O2 chronically. Pt alert& oriented, nad noted.

## 2023-01-18 NOTE — ED Notes (Signed)
Handoff report given to [facility] 

## 2023-01-18 NOTE — ED Notes (Signed)
RT Note: Patient placed on 5lpm [place]. Patient wears 5l at home

## 2023-01-18 NOTE — ED Notes (Signed)
[facility] at bedside 

## 2023-01-18 NOTE — Plan of Care (Signed)
72 year old M with PMH of IPF/chronic hypoxic RF on 4 to 5 L, PE on Eliquis, CAD/remote CABG, HLD, chronic pain, mood disorder, BPH and hematuria sent to [facility] from pulmonology office due to hypoxemia to low 80s on home 5 L, hypotension after he presented there with dyspnea and DOE.  In ED, slightly hypotensive to 89/60 but improved quickly.  Saturating from 93 to 100% on 5 L.  CBC, CMP, RVP and lactic acid negative.  CXR pending but seems to show more crowding bibasilar.  Blood cultures obtained.  Patient received Solu-Medrol, ceftriaxone, Zithromax and LR bolus 500 cc.  Hospitalist service called for admission.  Accepted to telemetry bed either [facility] or [facility] for overnight observation.

## 2023-01-18 NOTE — ED Notes (Signed)
Attempt to call report No answer  Will call back again.

## 2023-01-18 NOTE — ED Notes (Signed)
RT Note: VBG complete and handed to Dr. Rosalia Hammers

## 2023-01-18 NOTE — ED Notes (Signed)
RT Note: Patient was asked if he felt he needed a breathing treatment. He stated he feels he is breathing ok at this time and did not wish to take a breathing treatment. RT encouraged patient to let someone know if he changes his mind.

## 2023-01-18 NOTE — ED Notes (Signed)
VBG would not cross over into chart, a fax of the results was sent to point of care to upload to chart.

## 2023-01-18 NOTE — H&P (Addendum)
History and Physical    Patient: Richard Parrish DOB: [birth year] DOA: 01/18/2023 DOS: the patient was seen and examined on 01/18/2023 PCP: [name], MD  Patient coming from: Outside Hospital  Chief Complaint:  Chief Complaint  Patient presents with   Shortness of Breath   HPI: Richard Parrish is a 72 y.o. 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 Kandee Keen and Dr. Cliffton Asters on 02/2021, history of recurrent pneumothorax, submassive PE and LLE DVT (04/2021) on Eliquis, idiopathic pulmonary fibrosis, hyperlipidemia, chronic pain, depression presenting with progressive dyspnea.  He states that he was feeling at his usual state of health until this past weekend.  He began noticing increased breathlessness especially with physical exertion.  He has been on chronic oxygen therapy and uses 5 L at home.  He has not needed to increase this but also is unable to go without oxygen now like he could in the past.  He denies any fevers but does endorse subjective chills.  Denies any nausea, vomiting, chest pain, abdominal pain, diarrhea, constipation, urinary changes.  He does note that he has a chronic cough productive of mild yellow sputum and this has remained unchanged.  He spoke with his pulmonologist, Dr. Vassie Loll, earlier today and was advised to come in to the ED for further evaluation with concern for an exacerbation of his IPF.    ED course: CBC without leukocytosis and otherwise stable. CMP is unremarkable.  Lactate normal x 2.  Blood cultures and respiratory viral panel both pending.  Chest x-ray revealed diffuse reticular opacities (R>L) with progression from prior exam, concerning for progression of IPF versus superimposed infection.  [facility] hospitalist consulted to admit.   Review of Systems: As mentioned in the history of present illness. All other systems reviewed and are negative. Past  Medical History:  Diagnosis Date   Anxiety    Arthritis    "lower back" (04/07/2017)   BPH (benign prostatic hypertrophy)    C. difficile diarrhea    Chronic lower back pain    Coronary artery disease 1989   cabg with dvg to [facility]    Depression    GERD (gastroesophageal reflux disease)    Hyperlipidemia    Hypertension    Insomnia    Myocardial infarction (HCC) 2002   OSA on CPAP    cpap setting of 60 per pt   Osteoarthritis    "hips" (04/07/2017)   PAD (peripheral artery disease) (HCC)    Peripheral vascular disease (HCC)    Pneumonia 1989   S/P CABG   Pulmonary embolism (HCC) 05/18/2021   Pulmonary fibrosis (HCC)    "one time" (04/07/2017)   Spinal stenosis    Past Surgical History:  Procedure Laterality Date   ABDOMINAL AORTAGRAM N/A 03/15/2011   Procedure: ABDOMINAL Ronny Flurry;  Surgeon: Nada Libman, MD;  Location: [facility] CATH LAB;  Service: Cardiovascular;  Laterality: N/A;   ABDOMINAL AORTAGRAM N/A 04/12/2011   Procedure: ABDOMINAL Ronny Flurry;  Surgeon: Nada Libman, MD;  Location: [facility] CATH LAB;  Service: Cardiovascular;  Laterality: N/A;   BACK SURGERY     BRONCHIAL BIOPSY  03/03/2021   Procedure: BRONCHIAL BIOPSIES;  Surgeon: Josephine Igo, DO;  Location: [facility] ENDOSCOPY;  Service: Pulmonary;;   BRONCHIAL BRUSHINGS  03/03/2021   Procedure: BRONCHIAL BRUSHINGS;  Surgeon: Josephine Igo, DO;  Location: [facility] ENDOSCOPY;  Service: Pulmonary;;   BRONCHIAL NEEDLE ASPIRATION BIOPSY  03/03/2021   Procedure: BRONCHIAL NEEDLE  ASPIRATION BIOPSIES;  Surgeon: Josephine Igo, DO;  Location: [facility] ENDOS[facility];  Service: Pulmonary;;   CARDIAC CATHETERIZATION  08/1987   CORONARY ANGIOPLASTY WITH STENT PLACEMENT  ~ 2002   [facility], [place]   CORONARY ARTERY BYPASS GRAFT  09/1987   "CABG X1";  [facility]; [place]; "RCA"   FIDUCIAL MARKER PLACEMENT  03/03/2021   Procedure: FIDUCIAL DYE MARKING;  Surgeon: Josephine Igo, DO;  Location: [facility] ENDOS[facility];  Service: Pulmonary;;   INTERCOSTAL  NERVE BLOCK  03/03/2021   Procedure: INTERCOSTAL NERVE BLOCK;  Surgeon: Corliss Skains, MD;  Location: [facility] OR;  Service: Thoracic;;   JOINT REPLACEMENT     KNEE ARTHROSCOPY Left    LAPAROSCOPIC CHOLECYSTECTOMY     LYMPH NODE BIOPSY  03/03/2021   Procedure: LYMPH NODE BIOPSY;  Surgeon: Corliss Skains, MD;  Location: [facility];  Service: Thoracic;;   LYMPH NODE DISSECTION  03/03/2021   Procedure: LYMPH NODE DISSECTION;  Surgeon: Corliss Skains, MD;  Location: [facility];  Service: Thoracic;;   PERIPHERAL VASCULAR INTERVENTION Left 2016   "put a stent; right branch of left femoral"   POSTERIOR LUMBAR FUSION  2009   lumbar fusion, S L 5 to L 4   RIGHT/LEFT HEART CATH AND CORONARY/GRAFT ANGIOGRAPHY N/A 10/05/2020   Procedure: RIGHT/LEFT HEART CATH AND CORONARY/GRAFT ANGIOGRAPHY;  Surgeon: Yvonne Kendall, MD;  Location: [facility] INVASIVE CV LAB;  Service: Cardiovascular;  Laterality: N/A;   SHOULDER ARTHROSCOPY W/ ROTATOR CUFF REPAIR Right 2006   SHOULDER ARTHROSCOPY WITH SUBACROMIAL DECOMPRESSION AND OPEN ROTATOR C Left ~ 1983   TONSILLECTOMY AND ADENOIDECTOMY     TOTAL HIP ARTHROPLASTY Right 2012   TOTAL HIP ARTHROPLASTY Left 04/07/2017   TOTAL HIP ARTHROPLASTY Left 04/07/2017   Procedure: LEFT TOTAL HIP ARTHROPLASTY;  Surgeon: Frederico Hamman, MD;  [facility];  Service: Orthopedics;  Laterality: Left;   VIDEO BRONCHOSCOPY WITH RADIAL ENDOBRONCHIAL ULTRASOUND  03/03/2021   Procedure: VIDEO BRONCHOSCOPY WITH RADIAL ENDOBRONCHIAL ULTRASOUND;  Surgeon: Josephine Igo, DO;  Location: [facility];  Service: Pulmonary;;   Social History:  reports that he quit smoking about 35 years ago. His smoking use included cigarettes. He started smoking about 50 years ago. He has a 30 pack-year smoking history. He has never used smokeless tobacco. He reports current alcohol use. He reports that he does not use drugs.  Allergies  Allergen Reactions   Ambien [Zolpidem Tartrate] Other (See Comments)    Bad  dreams/Vivid Dreams   Metoprolol Rash    Family History  Problem Relation Age of Onset   Heart disease Mother    Cancer Father     Prior to Admission medications   Medication Sig Start Date End Date Taking? Authorizing Provider  ALPRAZolam Prudy Feeler) 0.5 MG tablet Take 0.5-1 tablets (0.25-0.5 mg total) by mouth 3 (three) times daily as needed for anxiety or sleep. Patient taking differently: Take 0.5 mg by mouth 4 (four) times daily as needed for anxiety. 05/26/21  Yes Standley Brooking, MD  antiseptic oral rinse (BIOTENE) LIQD 15 mLs by Mouth Rinse route daily as needed for dry mouth.   Yes [provider]  atorvastatin (LIPITOR) 80 MG tablet Take 1 tablet (80 mg total) by mouth daily. 12/29/22  Yes O'Neal, Ronnald Ramp, MD  budesonide (ENTOCORT EC) 3 MG 24 hr capsule Take 3 mg by mouth daily.   Yes [provider]  carvedilol (COREG) 6.25 MG tablet Take 1 tablet (6.25 mg total) by mouth 2 (two)  times daily. 09/30/22  Yes Meriam Sprague, MD  cholecalciferol (VITAMIN D3) 25 MCG (1000 UNIT) tablet Take 1,000 Units by mouth 3 (three) times a week.   Yes [provider]  diphenhydrAMINE (BENADRYL) 25 mg capsule Take 50 mg by mouth 2 (two) times daily.   Yes [provider]  ELIQUIS 5 MG TABS tablet TAKE 1 TABLET BY MOUTH TWICE A DAY 10/03/22  Yes Mannam, Praveen, MD  guaiFENesin (MUCINEX) 600 MG 12 hr tablet Take 1 tablet (600 mg total) by mouth 2 (two) times daily as needed. Patient taking differently: Take 600 mg by mouth daily. 04/27/21  Yes Roddenberry, Cecille Amsterdam, PA-C  HYDROcodone-acetaminophen (NORCO/VICODIN) 5-325 MG tablet Take 1 tablet by mouth 5 (five) times daily.   Yes [provider]  hyoscyamine (LEVSIN, ANASPAZ) 0.125 MG tablet Take 0.125 mg by mouth every 4 (four) hours as needed for cramping.    Yes [provider]  nitroGLYCERIN (NITROSTAT) 0.4 MG SL tablet Place 1 tablet (0.4 mg total) under the tongue every 5 (five) minutes  as needed for chest pain. 01/27/22  Yes Lyn Records, MD  OXYGEN Inhale 5 L/min into the lungs continuous.   Yes [provider]  pantoprazole (PROTONIX) 40 MG tablet TAKE 1 TABLET BY MOUTH EVERY DAY BEFORE BREAKFAST Patient taking differently: Take 40 mg by mouth daily before breakfast. 12/27/21  Yes Lyn Records, MD  PARoxetine (PAXIL) 20 MG tablet Take 20 mg by mouth daily.   Yes [provider]  Pirfenidone 801 MG TABS Take 1 tablet (801 mg total) by mouth 3 (three) times daily with meals. ([email or web address]) 11/02/22  Yes Mannam, Praveen, MD  sodium chloride (OCEAN) 0.65 % SOLN nasal spray Place 1 spray into both nostrils daily as needed for congestion.   Yes [provider]  tamsulosin (FLOMAX) 0.4 MG CAPS capsule Take 0.4 mg by mouth in the morning and at bedtime. 09/04/21  Yes [provider]  triamcinolone cream (KENALOG) 0.1 % Apply 1 Application topically 2 (two) times daily as needed (for itching).   Yes [provider]  TYLENOL PM EXTRA STRENGTH 500-25 MG TABS tablet Take 1 tablet by mouth at bedtime.   Yes [provider]  acetaminophen (TYLENOL) 500 MG tablet Take 1 tablet (500 mg total) by mouth every 6 (six) hours as needed for mild pain. Patient not taking: Reported on 01/18/2023 05/26/21   Standley Brooking, MD    Physical Exam: Vitals:   01/18/23 1600 01/18/23 1700 01/18/23 1742 01/18/23 1841  BP: 114/69 125/77  133/81  Pulse: 72 75  66  Resp: (!) 21 20  18   Temp:   98 F (36.7 C) 97.7 F (36.5 C)  TempSrc:   Oral Oral  SpO2: 100% 100%  97%  Weight:      Height:       Physical Exam Constitutional:      Appearance: He is well-developed.     Comments: Chronically ill-appearing elderly male, laying in bed, NAD.  HENT:     Head: Normocephalic and atraumatic.     Mouth/Throat:     Pharynx: Oropharynx is clear. No pharyngeal swelling or oropharyngeal exudate.  Eyes:     Extraocular Movements: Extraocular  movements intact.     Pupils: Pupils are equal, round, and reactive to light.  Cardiovascular:     Rate and Rhythm: Normal rate and regular rhythm.     Pulses: Normal pulses.     Heart sounds: Normal heart sounds.  No murmur heard.    No friction rub. No gallop.  Pulmonary:     Effort: Pulmonary effort is normal. No accessory muscle usage or respiratory distress.     Breath sounds: No stridor. No wheezing or rhonchi.     Comments: Bibasilar velcro-like crackles noted. Abdominal:     General: Bowel sounds are normal.     Palpations: Abdomen is soft.     Tenderness: There is no guarding or rebound.  Musculoskeletal:     Right lower leg: No edema.     Left lower leg: No edema.  Skin:    General: Skin is warm and dry.     Comments: Redness on arms and legs, chronic -- per patient is photosensitive reaction to pirfenidone.   Neurological:     General: No focal deficit present.     Mental Status: He is alert and oriented to person, place, and time.  Psychiatric:        Mood and Affect: Mood normal.        Behavior: Behavior normal.      Data Reviewed:     Latest Ref Rng & Units 01/18/2023    1:06 PM 11/25/2022    8:26 PM 10/17/2022    6:07 PM  CBC  WBC 4.0 - 10.5 K/uL 7.6  8.6  6.9   Hemoglobin 13.0 - 17.0 g/dL 95.2  84.1  32.4   Hematocrit 39.0 - 52.0 % 39.2  44.4  44.1   Platelets 150 - 400 K/uL 470  320  322       Latest Ref Rng & Units 01/18/2023    1:06 PM 12/01/2022    3:44 PM 11/25/2022    8:26 PM  CMP  Glucose 70 - 99 mg/dL 401  94  027   BUN 8 - 23 mg/dL 17  17  17    Creatinine 0.61 - 1.24 mg/dL 2.53  6.64  4.03   Sodium 135 - 145 mmol/L 137  140  138   Potassium 3.5 - 5.1 mmol/L 4.0  4.6  3.4   Chloride 98 - 111 mmol/L 101  101  103   CO2 22 - 32 mmol/L 28  25  24    Calcium 8.9 - 10.3 mg/dL 9.5  9.4  9.7   Total Protein 6.5 - 8.1 g/dL 6.7   7.0   Total Bilirubin <1.2 mg/dL 0.7   0.9   Alkaline Phos 38 - 126 U/L 60   78   AST 15 - 41 U/L 23   30   ALT 0 -  44 U/L 12   23    Lactic Acid, Venous    Component Value Date/Time   LATICACIDVEN 1.0 01/18/2023 1528   DG Chest Port 1 View  Result Date: 01/18/2023 CLINICAL DATA:  Shortness of breath. EXAM: PORTABLE CHEST 1 VIEW COMPARISON:  Radiograph 11/25/2022.  CT 09/12/2022 FINDINGS: Prior median sternotomy. Stable heart size and mediastinal contours. Chronic volume loss in right hemithorax. Surgical clips at the right hilum. Diffuse reticular opacities, right greater than left, with progression from prior exam. There may be a small right pleural effusion. No evidence of confluent airspace disease. No pneumothorax. IMPRESSION: 1. Diffuse reticular opacities, right greater than left, with progression from prior exam. May represent progression of interstitial lung disease versus superimposed infection. 2. Chronic volume loss in the right hemithorax. Electronically Signed   By: Narda Rutherford M.D.   On: 01/18/2023 16:44     Assessment and Plan: No notes have  been filed under this hospital service. 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  Labs thus far have been unrevealing.  Chest x-ray shows possible worsening of IPF versus superimposed infection.  He was initially hypotensive but responded well to IV fluids.  Remains afebrile, without tachycardia, without leukocytosis.  He remains at his chronic home oxygen requirement of 5 L [place].  Will provide high-dose steroids as recommended by Dr. Vassie Loll in his note from earlier today along with CAP coverage given concern for possible infection.  Will obtain a CT chest for further elucidation.  If negative for infection, can discontinue antibiotics.  If CT chest showing progression of IPF, may need to provide high-dose steroids for a few days  given acute exacerbation followed by a taper. -pulmonology consulted, will provide formal recs tomorrow -Ceftriaxone and azithromycin -Solu-Medrol, can begin taper once symptoms are improved -Continue home pirfenidone -Continue supplemental oxygen, maintain O2 sats greater than 90% -f/u CT chest -f/u blood cultures, respiratory viral panel, procalcitonin -PT/OT eval  History of PE/DVT On Eliquis Recent history of hematuria which has since resolved.  Hemoglobin appears stable.  Will continue to trend. -Continue home Eliquis -Trend hemoglobin  Chronic pain -Continue home Norco every 4 hours as needed  HTN Patient had low BP earlier today at [facility] which responded quickly to IVF. Will hold home coreg today to ensure BP stays at goal overnight. Can restart coreg tomorrow. -holding coreg, can restart tomorrow if BP remains stable  Hyperlipidemia -Continue home Lipitor  Depression Anxiety Patient is on xanax 0.5mg  TID prn at home but rarely uses this. We agreed to hold this medicine for now and for him to request it should he need it. -Continue home paroxetine  BPH -Continue home Flomax  GERD -Continue home Protonix   Advance Care Planning:   Code Status: Full Code Confirmed with patient and wife at bedside.  Consults: none  Family Communication: updated wife at bedside  Severity of Illness: The appropriate patient status for this patient is OBSERVATION. Observation status is judged to be reasonable and necessary in order to provide the required intensity of service to ensure the patient's safety. The patient's presenting symptoms, physical exam findings, and initial radiographic and laboratory data in the context of their medical condition is felt to place them at decreased risk for further clinical deterioration. Furthermore, it is anticipated that the patient will be medically stable for discharge from the hospital within 2 midnights of admission.   Author: Briscoe Burns, MD 01/18/2023 9:20 PM  For on call review [email or web address].

## 2023-01-18 NOTE — ED Provider Notes (Signed)
[place] EMERGENCY DEPARTMENT AT [facility] Provider Note   CSN: [id number] Arrival date & time: 01/18/23  1220     History {Add pertinent medical, surgical, social history, OB history to HPI:1} Chief Complaint  Patient presents with   Shortness of Breath    Richard Parrish is a 72 y.o. male.  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 Patient has been on blood thinners and noted hematuria several weeks ago.  No other bleeding noted- no blood in stool some dark stool.  Patient reports some chills over 1.5 weeks.  He has not had fever or change in cough or sputum production.  He was dyspneic on exertion yesterday.  No chest pain, abdominal pain, nausea or vomiting.  Taking eliquis as prescribed, denies missed dosages. On for 2 years since pe 2 years ago.        Home Medications Prior to Admission medications   Medication Sig Start Date End Date Taking? Authorizing Provider  acetaminophen (TYLENOL) 500 MG tablet Take 1 tablet (500 mg total) by mouth every 6 (six) hours as needed for mild pain. 05/26/21   Standley Brooking, MD  ALPRAZolam Prudy Feeler) 0.5 MG tablet Take 0.5-1 tablets (0.25-0.5 mg total) by mouth 3 (three) times daily as needed for anxiety or sleep. 05/26/21   Standley Brooking, MD  antiseptic oral rinse (BIOTENE) LIQD 15 mLs by Mouth Rinse route daily as needed for dry mouth.    [provider]  atorvastatin (LIPITOR) 80 MG tablet Take 1 tablet (80 mg total) by mouth daily. 12/29/22   O'NealRonnald Ramp, MD  budesonide (ENTOCORT EC) 3 MG 24 hr capsule Take 3 mg by mouth daily.    [provider]  carvedilol (COREG) 6.25 MG tablet Take 1 tablet (6.25 mg total) by mouth 2 (two) times daily. 09/30/22   Meriam Sprague, MD  cholecalciferol (VITAMIN D3) 25 MCG (1000 UNIT) tablet Take 1,000 Units by mouth in the morning.    [provider]  diphenhydrAMINE (BENADRYL) 25 mg capsule Take 25 mg by mouth at bedtime as needed for sleep (for sleep).    [provider]  ELIQUIS 5 MG TABS tablet TAKE 1 TABLET BY MOUTH TWICE A DAY 10/03/22   Mannam, Praveen, MD  guaiFENesin (MUCINEX) 600 MG 12 hr tablet Take 1 tablet (600 mg total) by mouth 2 (two) times daily as needed. 04/27/21   Leary Roca, PA-C  HYDROcodone-acetaminophen (NORCO) 7.5-325 MG tablet Take 1 tablet by mouth every 4 (four) hours as needed. 08/26/21   [provider]  hyoscyamine (LEVSIN, ANASPAZ) 0.125 MG tablet Take 0.125 mg by mouth every 4 (four) hours as needed for cramping.     [provider]  nitroGLYCERIN (NITROSTAT) 0.4 MG SL tablet Place 1 tablet (0.4 mg total) under the tongue every 5 (five) minutes as needed for chest pain. 01/27/22   Lyn Records, MD  pantoprazole (PROTONIX) 40 MG tablet TAKE 1 TABLET BY MOUTH EVERY DAY BEFORE BREAKFAST 12/27/21   Lyn Records, MD  PARoxetine (PAXIL) 40 MG tablet Take 40 mg by mouth in the morning. 01/05/15   [provider]  Pirfenidone 801 MG TABS Take 1 tablet (801 mg total) by mouth 3 (three) times daily with meals. ([email or web address]) 11/02/22   Chilton Greathouse, MD  Probiotic Product (PROBIOTIC PO) Take 1 capsule by mouth daily.    [provider]  sodium chloride (OCEAN) 0.65 % SOLN nasal spray Place 1 spray into both nostrils daily as needed for congestion.    [provider]  tamsulosin (FLOMAX) 0.4 MG CAPS capsule Take 0.4 mg by mouth 2 (two) times daily. 09/04/21   [provider]  triamcinolone cream (KENALOG) 0.1 % Apply 1 Application topically 2 (two) times daily.    [provider]      Allergies    Ambien [zolpidem tartrate] and Metoprolol    Review of Systems   Review of Systems  Physical Exam Updated Vital Signs BP 112/67   Pulse 64   Temp 97.7 F (36.5 C) (Oral)   Resp (!) 23   Ht 1.829 m (6')   Wt 84.8 kg   SpO2 100%    BMI 25.35 kg/m  Physical Exam Vitals and nursing note reviewed.  HENT:     Head: Normocephalic.     Mouth/Throat:     Mouth: Mucous membranes are moist.  Eyes:     Pupils: Pupils are equal, round, and reactive to light.  Pulmonary:     Effort: Tachypnea present.     Breath sounds: Rhonchi present.     Comments: Diffuse crackles Chest:     Chest wall: No mass.  Abdominal:     Palpations: Abdomen is soft.  Musculoskeletal:        General: Normal range of motion.     Cervical back: Normal range of motion and neck supple.  Skin:    General: Skin is warm and dry.     Capillary Refill: Capillary refill takes less than 2 seconds.  Neurological:     General: No focal deficit present.     Mental Status: He is alert.  Psychiatric:        Mood and Affect: Mood normal.     ED Results / Procedures / Treatments   Labs (all labs ordered are listed, but only abnormal results are displayed) Labs Reviewed  CBC WITH DIFFERENTIAL/PLATELET - Abnormal; Notable for the following components:      Result Value   RBC 4.11 (*)    Platelets 470 (*)    Lymphs Abs 0.3 (*)    All other components within normal limits  COMPREHENSIVE METABOLIC PANEL - Abnormal; Notable for the following components:   Glucose, Bld 110 (*)    All other components within normal limits  RESP PANEL BY RT-PCR (RSV, FLU A&B, COVID)  [id number]  CULTURE, BLOOD (ROUTINE X 2)  CULTURE, BLOOD (ROUTINE X 2)  LACTIC ACID, PLASMA  LACTIC ACID, PLASMA  BLOOD GAS, VENOUS    EKG EKG Interpretation Date/Time:  Wednesday January 18 2023 12:48:24 EST Ventricular Rate:  67 PR Interval:  177 QRS Duration:  97 QT Interval:  391 QTC Calculation: 413 R Axis:   9  Text Interpretation: Sinus rhythm RSR' in V1 or V2, probably normal variant Borderline T abnormalities, anterior leads Confirmed by Margarita Grizzle [phone]) on 01/18/2023 12:59:09 PM  Radiology No results found.  Procedures Procedures  {Document cardiac monitor,  telemetry assessment procedure when appropriate:1}  Medications Ordered in ED Medications  albuterol (VENTOLIN HFA) 108 (90 Base) MCG/ACT inhaler 2 puff (has no administration in time range)  lactated ringers bolus 500 mL (0 mLs Intravenous Stopped 01/18/23 1434)  methylPREDNISolone sodium succinate (SOLU-MEDROL) 125 mg/2 mL injection 125 mg (125 mg Intravenous Given 01/18/23 1332)    ED Course/ Medical Decision Making/ A&P Clinical Course as of 01/18/23 1540  Wed Jan 18, 2023  1234  Received call from Patient has IPS on 4-5 liters nasal cannula sats 79% with new dyspnea Crackles at baseline. Hematuria last few days  On apixaban BP 89/58 ?AP flare needs blood work  [DR]  1459 Chest x-Demiana Crumbley reviewed and interpreted with diffuse interstitial increased markings that may have some increase in the left base although were present on prior chest x-rays awaiting radiologist interpretation [DR]    Clinical Course User Index [DR] Margarita Grizzle, MD   {   Click here for ABCD2, HEART and other calculatorsREFRESH Note before signing :1}                              Medical Decision Making Amount and/or Complexity of Data Reviewed Labs: ordered. Radiology: ordered.  Risk Prescription drug management.   Patient with interstitial pulmonary fibrosis sent from Dr. Lanier Clam office with dyspnea, decreased sats, increased work of breathing, decreased blood pressure Differential diagnosis includes but is not limited to acute pulmonary infection, anemia, electrolyte abnormality, sepsis, worsening of fibrosis, PE, Patient evaluated for flu and COVID which are all negative CBC is reviewed and shows a normal hemoglobin and normal white blood cell count however platelets are elevated Electrolytes are  normal First lactic acid is normal Doubt PE as patient is on anticoagulants that has been taking regularly Doubt sepsis with normal lactic acid and blood pressure has normalized with small fluid bolus Chest  x-Allee Busk shows no definite acute infiltrate.  Awaiting radiologist interpretation Plan admission for ongoing evaluation and treatment.  {Document critical care time when appropriate:1} {Document review of labs and clinical decision tools ie heart score, Chads2Vasc2 etc:1}  {Document your independent review of radiology images, and any outside records:1} {Document your discussion with family members, caretakers, and with consultants:1} {Document social determinants of health affecting pt's care:1} {Document your decision making why or why not admission, treatments were needed:1} Final Clinical Impression(s) / ED Diagnoses Final diagnoses:  None    Rx / DC Orders ED Discharge Orders     None

## 2023-01-18 NOTE — ED Notes (Signed)
Richard Parrish with cl called/chat with transport information

## 2023-01-19 DIAGNOSIS — J9621 Acute and chronic respiratory failure with hypoxia: Secondary | ICD-10-CM | POA: Diagnosis not present

## 2023-01-19 DIAGNOSIS — J84112 Idiopathic pulmonary fibrosis: Principal | ICD-10-CM

## 2023-01-19 LAB — COMPREHENSIVE METABOLIC PANEL
ALT: 17 U/L (ref 0–44)
AST: 27 U/L (ref 15–41)
Albumin: 3.1 g/dL — ABNORMAL LOW (ref 3.5–5.0)
Alkaline Phosphatase: 65 U/L (ref 38–126)
Anion gap: 8 (ref 5–15)
BUN: 18 mg/dL (ref 8–23)
CO2: 27 mmol/L (ref 22–32)
Calcium: 9.4 mg/dL (ref 8.9–10.3)
Chloride: 105 mmol/L (ref 98–111)
Creatinine, Ser: 0.71 mg/dL (ref 0.61–1.24)
GFR, Estimated: >60 mL/min (ref >60)
Glucose, Bld: 145 mg/dL — ABNORMAL HIGH (ref 70–99)
Potassium: 5.9 mmol/L — ABNORMAL HIGH (ref 3.5–5.1)
Sodium: 140 mmol/L (ref 135–145)
Total Bilirubin: 0.7 mg/dL (ref <1.2)
Total Protein: 6.7 g/dL (ref 6.5–8.1)

## 2023-01-19 LAB — CBC
HCT: 38.6 % — ABNORMAL LOW (ref 39.0–52.0)
Hemoglobin: 12.7 g/dL — ABNORMAL LOW (ref 13.0–17.0)
MCH: 31.8 pg (ref 26.0–34.0)
MCHC: 32.9 g/dL (ref 30.0–36.0)
MCV: 96.5 fL (ref 80.0–100.0)
Platelets: 488 10*3/uL — ABNORMAL HIGH (ref 150–400)
RBC: 4 MIL/uL — ABNORMAL LOW (ref 4.22–5.81)
RDW: 11.6 % (ref 11.5–15.5)
WBC: 5.3 10*3/uL (ref 4.0–10.5)
nRBC: 0 % (ref 0.0–0.2)

## 2023-01-19 LAB — GLUCOSE, CAPILLARY
Glucose-Capillary: 136 mg/dL — ABNORMAL HIGH (ref 70–99)
Glucose-Capillary: 136 mg/dL — ABNORMAL HIGH (ref 70–99)

## 2023-01-19 LAB — POTASSIUM: Potassium: 3.5 mmol/L (ref 3.5–5.1)

## 2023-01-19 MED ORDER — METHYLPREDNISOLONE SODIUM SUCC 125 MG IJ SOLR
125.0000 mg | INTRAMUSCULAR | Status: DC
  Administered 2023-01-19: 125 mg via INTRAVENOUS
  Filled 2023-01-19: qty 2

## 2023-01-19 MED ORDER — ORAL CARE MOUTH RINSE: 15.0000 mL | OROMUCOSAL | Status: DC | PRN

## 2023-01-19 MED ORDER — CARVEDILOL 6.25 MG PO TABS
6.2500 mg | ORAL_TABLET | Freq: Two times a day (BID) | ORAL | Status: DC
  Administered 2023-01-19 – 2023-01-23 (×8): 6.25 mg via ORAL
  Filled 2023-01-19 (×8): qty 1

## 2023-01-19 MED ORDER — METHYLPREDNISOLONE SODIUM SUCC 125 MG IJ SOLR
80.0000 mg | Freq: Two times a day (BID) | INTRAMUSCULAR | Status: DC
  Administered 2023-01-19 – 2023-01-23 (×8): 80 mg via INTRAVENOUS
  Filled 2023-01-19 (×8): qty 2

## 2023-01-19 MED ORDER — ALPRAZOLAM 0.25 MG PO TABS
0.2500 mg | ORAL_TABLET | Freq: Three times a day (TID) | ORAL | Status: DC | PRN
  Administered 2023-01-19 – 2023-01-22 (×6): 0.5 mg via ORAL
  Filled 2023-01-19: qty 2
  Filled 2023-01-19: qty 1
  Filled 2023-01-19 (×5): qty 2

## 2023-01-19 NOTE — Evaluation (Signed)
Occupational Therapy Evaluation Patient Details Name: Richard Parrish MRN: [id number] DOB: 1951/02/08 Today's Date: 01/19/2023   History of Present Illness 72 y.o. 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   Clinical Impression   Patient admitted for the diagnosis above.  PTA he lives at home with his spouse, has needed DME, and needed no assist for ADL,iADL or mobility.  Patient is up and moving in the room ad lib with elongated O2 cord.  Patient endorses DOE, but states he is at his baseline for mobility and ADL.  No OT needs identified in the acute setting, and no post acute OT anticipated.         If plan is discharge home, recommend the following: Assist for transportation    Functional Status Assessment  Patient has not had a recent decline in their functional status  Equipment Recommendations  None recommended by OT    Recommendations for Other Services       Precautions / Restrictions Precautions Precautions: Fall Restrictions Weight Bearing Restrictions: No      Mobility Bed Mobility Overal bed mobility: Independent               Patient Response: Cooperative  Transfers Overall transfer level: Independent Equipment used: None                      Balance Overall balance assessment: No apparent balance deficits (not formally assessed)                                         ADL either performed or assessed with clinical judgement   ADL Overall ADL's : At baseline                                             Vision Patient Visual Report: No change from baseline       Perception Perception: Within Functional Limits        Praxis Praxis: WFL       Pertinent Vitals/Pain Pain Assessment Pain Assessment: No/denies pain     Extremity/Trunk Assessment Upper Extremity Assessment Upper Extremity Assessment: Overall WFL for tasks assessed   Lower Extremity Assessment Lower Extremity Assessment: Defer to PT evaluation   Cervical / Trunk Assessment Cervical / Trunk Assessment: Normal   Communication Communication Communication: No apparent difficulties   Cognition Arousal: Alert Behavior During Therapy: WFL for tasks assessed/performed Overall Cognitive Status: Within Functional Limits for tasks assessed                                       General Comments   DOE    Exercises     Shoulder Instructions      Home Living Family/patient expects to be discharged to:: Private residence Living Arrangements: Spouse/significant other Available Help at Discharge: Family;Available 24 hours/day Type of Home: House Home Access: Stairs to enter [employer] of Steps: 4 Entrance Stairs-Rails: Left Home Layout: One level     Bathroom Shower/Tub:  Walk-in shower   Bathroom Toilet: Standard Bathroom Accessibility: Yes How Accessible: Accessible via walker Home Equipment: Rolling Walker (2 wheels);Rollator (4 wheels);BSC/3in1;Shower seat;Wheelchair - manual          Prior Functioning/Environment Prior Level of Function : Independent/Modified Independent                        OT Problem List: Decreased activity tolerance      OT Treatment/Interventions:      OT Goals(Current goals can be found in the care plan section) Acute Rehab OT Goals Patient Stated Goal: Hoping to return home today OT Goal Formulation: With patient Time For Goal Achievement: 01/26/23 Potential to Achieve Goals: Good  OT Frequency:      Co-evaluation              AM-PAC OT "6 Clicks" Daily Activity     Outcome Measure Help from another person eating meals?: None Help from  another person taking care of personal grooming?: None Help from another person toileting, which includes using toliet, bedpan, or urinal?: None Help from another person bathing (including washing, rinsing, drying)?: None Help from another person to put on and taking off regular upper body clothing?: None Help from another person to put on and taking off regular lower body clothing?: None 6 Click Score: 24   End of Session Nurse Communication: Mobility status  Activity Tolerance: Patient tolerated treatment well Patient left: in chair;with call bell/phone within reach  OT Visit Diagnosis: Unsteadiness on feet (R26.81)                Time: 5409-8119 OT Time Calculation (min): 22 min Charges:  OT General Charges $OT Visit: 1 Visit OT Evaluation $OT Eval Moderate Complexity: 1 Mod  01/19/2023  RP, OTR/L  Acute Rehabilitation Services  Office:  [phone]   Suzanna Obey 01/19/2023, 8:41 AM

## 2023-01-19 NOTE — Plan of Care (Signed)
°  Problem: Activity: °Goal: Ability to tolerate increased activity will improve °Outcome: Progressing °  °Problem: Respiratory: °Goal: Ability to maintain adequate ventilation will improve °Outcome: Progressing °Goal: Ability to maintain a clear airway will improve °Outcome: Progressing °  °

## 2023-01-19 NOTE — Progress Notes (Signed)
Patient 85% on room air at rest in bed.  Patient 78% with ambulation on 6L, 8L with nasal cannula.  Patient 88% on 10L [place] with ambulation.  Patient denies SOB (worse than baseline). Pt was dyspneic with ambulation.  Dr. Caleb Popp aware.

## 2023-01-19 NOTE — Plan of Care (Signed)
  Problem: Education: Goal: Knowledge of General Education information will improve Description: Including pain rating scale, medication(s)/side effects and non-pharmacologic comfort measures Outcome: Progressing   Problem: Activity: Goal: Risk for activity intolerance will decrease Outcome: Progressing   Problem: Coping: Goal: Level of anxiety will decrease Outcome: Progressing   Problem: Pain Management: Goal: General experience of comfort will improve Outcome: Progressing

## 2023-01-19 NOTE — Progress Notes (Signed)
PT Cancellation Note / Screen  Patient Details Name: Richard Parrish MRN: [id number] DOB: [birth year]   Cancelled Treatment:    Reason Eval/Treat Not Completed: PT screened, no needs identified, will sign off  Pt independent and up ad lib in room (confirmed no PT needs with RN).  Pt does not appear to need skilled acute PT at this time.  PT to sign off.   Janan Halter [place] 01/19/2023, 9:37 AM Paulino Door, DPT Physical Therapist Acute Rehabilitation Services Office: [phone]

## 2023-01-19 NOTE — Progress Notes (Signed)
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 He is followed by Dr. Vassie Loll at [facility] who advised him to be evaluated for IPF exacerbation. He reports for the last week he has had progressive shortness of breath with activity. 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 Has not had to increase his home O2 levels however in the ED noted to have SpO2 79% initially. On 5L will decrease to 92% with severe dyspnea. Has baseline productive cough that is unchanged. Denies fevers, chills, wheezing. Denies chest pain or lower extremity edema.  RVP neg. Procal neg. LA 1.0. CBC and CMET unremarkable without evidence of leukocytosis. WBC 7.6 with lymphopenia so possible viral. 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 rate 19, height 6' (1.829 m), weight 83 kg, SpO2 92%.    FiO2 (%):  [40 %] 40 %   Intake/Output Summary (Last 24 hours) at 01/19/2023 0849 Last data filed at 01/19/2023 0700 Gross per 24 hour  Intake 360 ml  Output 900 ml  Net -540 ml   Filed Weights   01/18/23 1233 01/19/23 0500  Weight: 84.8 kg 83 kg   Physical  Exam: General: Well-appearing, no acute distress HENT: [place], AT, OP clear, MMM Eyes: EOMI, no scleral icterus Respiratory: Inspiratory crackles at bases Cardiovascular: RRR, -M/R/G, no JVD Extremities:-Edema,-tenderness Neuro: AAO x4, CNII-XII grossly intact Skin: Intact, no rashes or bruising Psych: Normal mood, normal affect  Resolved Hospital Problem list   N/A  Assessment & Plan:  ILD/IPF exacerbation Chronic hypoxemic respiratory failure -CT Chest 11/21 with increased interstitial and septal thickening bilaterally with interval involvement left lung, increased at the bases on my interpretation. Final radiology read pending May be progression of ILD but reasonable to treat with steroids to minimize inflammation Continue IV steroids x 2 days. Plan for taper  Prednisone 60 mg x 5 days. Decrease by 10 mg every 5 days.  Ok to complete zithromycin x 3 days. Procal wnl so no indication for prolonged abx Continue supplemental O2 for goal >88%. Currently on baseline Will need ambulatory O2 prior to discharge to determine home O2 needs Resume home pirfenidone at discharge  Hx pulmonary embolism --Continue home eliquis  Best Practice (right click and "Reselect all SmartList Selections" daily)   Diet/type: Regular consistency (see orders) DVT prophylaxis: DOAC GI prophylaxis: PPI Lines: N/A Foley:  N/A Code Status:  full code Last date of multidisciplinary goals of care discussion [ ]  per primary  Labs   CBC: Recent Labs  Lab 01/18/23 1306 01/19/23 0403  WBC 7.6 5.3  NEUTROABS 6.0  --   HGB 13.3 12.7*  HCT 39.2 38.6*  MCV 95.4 96.5  PLT 470* 488*  Basic Metabolic Panel: Recent Labs  Lab 01/18/23 1306 01/19/23 0403  NA 137 140  K 4.0 5.9*  CL 101 105  CO2 28 27  GLUCOSE 110* 145*  BUN 17 18  CREATININE 0.74 0.71  CALCIUM 9.5 9.4   GFR: Estimated Creatinine Clearance: 90.3 mL/min (by C-G formula based on SCr of 0.71 mg/dL). Recent Labs  Lab 01/18/23 1306  01/18/23 1528 01/18/23 2213 01/19/23 0403  PROCALCITON  --   --  <0.10  --   WBC 7.6  --   --  5.3  LATICACIDVEN 1.3 1.0  --   --     Liver Function Tests: Recent Labs  Lab 01/18/23 1306 01/19/23 0403  AST 23 27  ALT 12 17  ALKPHOS 60 65  BILITOT 0.7 0.7  PROT 6.7 6.7  ALBUMIN 3.6 3.1*   No results for input(s): "LIPASE", "AMYLASE" in the last 168 hours. No results for input(s): "AMMONIA" in the last 168 hours.  ABG    Component Value Date/Time   PHART 7.402 03/02/2021 1124   PCO2ART 38.9 03/02/2021 1124   PO2ART 102 03/02/2021 1124   HCO3 23.7 03/02/2021 1124   TCO2 25 05/18/2021 1456   ACIDBASEDEF 0.5 03/02/2021 1124   O2SAT 97.6 03/02/2021 1124     Coagulation Profile: No results for input(s): "INR", "PROTIME" in the last 168 hours.  Cardiac Enzymes: No results for input(s): "CKTOTAL", "CKMB", "CKMBINDEX", "TROPONINI" in the last 168 hours.  HbA1C: Hgb A1c MFr Bld  Date/Time Value Ref Range Status  10/03/2020 07:49 PM 5.3 4.8 - 5.6 % Final    Comment:    (NOTE) Pre diabetes:          5.7%-6.4%  Diabetes:              >6.4%  Glycemic control for   <7.0% adults with diabetes   01/08/2018 08:21 AM 5.1 4.8 - 5.6 % Final    Comment:             Prediabetes: 5.7 - 6.4          Diabetes: >6.4          Glycemic control for adults with diabetes: <7.0     CBG: Recent Labs  Lab 01/19/23 0739  GLUCAP 136*    Review of Systems:   Review of Systems  Constitutional:  Negative for chills, diaphoresis, fever, malaise/fatigue and weight loss.  HENT:  Negative for congestion.   Respiratory:  Positive for cough, sputum production and shortness of breath. Negative for hemoptysis and wheezing.   Cardiovascular:  Negative for chest pain, palpitations and leg swelling.     Past Medical History:  He,  has a past medical history of Anxiety, Arthritis, BPH (benign prostatic hypertrophy), C. difficile diarrhea, Chronic lower back pain, Coronary artery disease  (1989), Depression, GERD (gastroesophageal reflux disease), Hyperlipidemia, Hypertension, Insomnia, Myocardial infarction (HCC) (2002), OSA on CPAP, Osteoarthritis, PAD (peripheral artery disease) (HCC), Peripheral vascular disease (HCC), Pneumonia (1989), Pulmonary embolism (HCC) (05/18/2021), Pulmonary fibrosis (HCC), and Spinal stenosis.   Surgical History:   Past Surgical History:  Procedure Laterality Date   ABDOMINAL AORTAGRAM N/A 03/15/2011   Procedure: ABDOMINAL AORTAGRAM;  Surgeon: Nada Libman, MD;  Location: [facility] CATH LAB;  Service: Cardiovascular;  Laterality: N/A;   ABDOMINAL AORTAGRAM N/A 04/12/2011   Procedure: ABDOMINAL Ronny Flurry;  Surgeon: Nada Libman, MD;  Location: [facility] CATH LAB;  Service: Cardiovascular;  Laterality: N/A;   BACK SURGERY     BRONCHIAL BIOPSY  03/03/2021  Procedure: BRONCHIAL BIOPSIES;  Surgeon: Josephine Igo, DO;  Location: [facility];  Service: Pulmonary;;   BRONCHIAL BRUSHINGS  03/03/2021   Procedure: BRONCHIAL BRUSHINGS;  Surgeon: Josephine Igo, DO;  Location: [facility];  Service: Pulmonary;;   BRONCHIAL NEEDLE ASPIRATION BIOPSY  03/03/2021   Procedure: BRONCHIAL NEEDLE ASPIRATION BIOPSIES;  Surgeon: Josephine Igo, DO;  Location: [facility];  Service: Pulmonary;;   CARDIAC CATHETERIZATION  08/1987   CORONARY ANGIOPLASTY WITH STENT PLACEMENT  ~ 2002   [facility], [place]   CORONARY ARTERY BYPASS GRAFT  09/1987   "CABG X1";  [facility]; [place]; "RCA"   FIDUCIAL MARKER PLACEMENT  03/03/2021   Procedure: FIDUCIAL DYE MARKING;  Surgeon: Josephine Igo, DO;  Location: [facility];  Service: Pulmonary;;   INTERCOSTAL NERVE BLOCK  03/03/2021   Procedure: INTERCOSTAL NERVE BLOCK;  Surgeon: Corliss Skains, MD;  Location: [facility] OR;  Service: Thoracic;;   JOINT REPLACEMENT     KNEE ARTHROSCOPY Left    LAPAROSCOPIC CHOLECYSTECTOMY     LYMPH NODE BIOPSY  03/03/2021   Procedure: LYMPH NODE BIOPSY;  Surgeon: Corliss Skains, MD;   Location: [facility] OR;  Service: Thoracic;;   LYMPH NODE DISSECTION  03/03/2021   Procedure: LYMPH NODE DISSECTION;  Surgeon: Corliss Skains, MD;  Location: [facility] OR;  Service: Thoracic;;   PERIPHERAL VASCULAR INTERVENTION Left 2016   "put a stent; right branch of left femoral"   POSTERIOR LUMBAR FUSION  2009   lumbar fusion, S L 5 to L 4   RIGHT/LEFT HEART CATH AND CORONARY/GRAFT ANGIOGRAPHY N/A 10/05/2020   Procedure: RIGHT/LEFT HEART CATH AND CORONARY/GRAFT ANGIOGRAPHY;  Surgeon: Yvonne Kendall, MD;  Location: [facility] CV LAB;  Service: Cardiovascular;  Laterality: N/A;   SHOULDER ARTHROSCOPY W/ ROTATOR CUFF REPAIR Right 2006   SHOULDER ARTHROSCOPY WITH SUBACROMIAL DECOMPRESSION AND OPEN ROTATOR C Left ~ 1983   TONSILLECTOMY AND ADENOIDECTOMY     TOTAL HIP ARTHROPLASTY Right 2012   TOTAL HIP ARTHROPLASTY Left 04/07/2017   TOTAL HIP ARTHROPLASTY Left 04/07/2017   Procedure: LEFT TOTAL HIP ARTHROPLASTY;  Surgeon: Frederico Hamman, MD;  Location: [facility] OR;  Service: Orthopedics;  Laterality: Left;   VIDEO BRONCHOSCOPY WITH RADIAL ENDOBRONCHIAL ULTRASOUND  03/03/2021   Procedure: VIDEO BRONCHOSCOPY WITH RADIAL ENDOBRONCHIAL ULTRASOUND;  Surgeon: Josephine Igo, DO;  Location: [facility];  Service: Pulmonary;;     Social History:   reports that he quit smoking about 35 years ago. His smoking use included cigarettes. He started smoking about 50 years ago. He has a 30 pack-year smoking history. He has never used smokeless tobacco. He reports current alcohol use. He reports that he does not use drugs.   Family History:  His family history includes Cancer in his father; Heart disease in his mother.   Allergies Allergies  Allergen Reactions   Ambien [Zolpidem Tartrate] Other (See Comments)    Bad dreams/Vivid Dreams   Metoprolol Rash     Home Medications  Prior to Admission medications   Medication Sig Start Date End Date Taking? Authorizing Provider  ALPRAZolam Prudy Feeler) 0.5 MG tablet Take  0.5-1 tablets (0.25-0.5 mg total) by mouth 3 (three) times daily as needed for anxiety or sleep. Patient taking differently: Take 0.5 mg by mouth 4 (four) times daily as needed for anxiety. 05/26/21  Yes Standley Brooking, MD  antiseptic oral rinse (BIOTENE) LIQD 15 mLs by Mouth Rinse route daily as needed for dry mouth.   Yes [provider]  atorvastatin (LIPITOR) 80 MG tablet Take 1 tablet (80 mg total) by mouth daily. 12/29/22  Yes O'Neal, Ronnald Ramp, MD  budesonide (ENTOCORT EC) 3 MG 24 hr capsule Take 3 mg by mouth daily.   Yes [provider]  carvedilol (COREG) 6.25 MG tablet Take 1 tablet (6.25 mg total) by mouth 2 (two) times daily. 09/30/22  Yes Meriam Sprague, MD  cholecalciferol (VITAMIN D3) 25 MCG (1000 UNIT) tablet Take 1,000 Units by mouth 3 (three) times a week.   Yes [provider]  diphenhydrAMINE (BENADRYL) 25 mg capsule Take 50 mg by mouth 2 (two) times daily.   Yes [provider]  ELIQUIS 5 MG TABS tablet TAKE 1 TABLET BY MOUTH TWICE A DAY 10/03/22  Yes Mannam, Praveen, MD  guaiFENesin (MUCINEX) 600 MG 12 hr tablet Take 1 tablet (600 mg total) by mouth 2 (two) times daily as needed. Patient taking differently: Take 600 mg by mouth daily. 04/27/21  Yes Roddenberry, Cecille Amsterdam, PA-C  HYDROcodone-acetaminophen (NORCO/VICODIN) 5-325 MG tablet Take 1 tablet by mouth 5 (five) times daily.   Yes [provider]  hyoscyamine (LEVSIN, ANASPAZ) 0.125 MG tablet Take 0.125 mg by mouth every 4 (four) hours as needed for cramping.    Yes [provider]  nitroGLYCERIN (NITROSTAT) 0.4 MG SL tablet Place 1 tablet (0.4 mg total) under the tongue every 5 (five) minutes as needed for chest pain. 01/27/22  Yes Lyn Records, MD  OXYGEN Inhale 5 L/min into the lungs continuous.   Yes [provider]  pantoprazole (PROTONIX) 40 MG tablet TAKE 1 TABLET BY MOUTH EVERY DAY BEFORE BREAKFAST Patient taking differently: Take 40 mg by mouth  daily before breakfast. 12/27/21  Yes Lyn Records, MD  PARoxetine (PAXIL) 20 MG tablet Take 20 mg by mouth daily.   Yes [provider]  Pirfenidone 801 MG TABS Take 1 tablet (801 mg total) by mouth 3 (three) times daily with meals. ([email or web address]) 11/02/22  Yes Mannam, Praveen, MD  sodium chloride (OCEAN) 0.65 % SOLN nasal spray Place 1 spray into both nostrils daily as needed for congestion.   Yes [provider]  tamsulosin (FLOMAX) 0.4 MG CAPS capsule Take 0.4 mg by mouth in the morning and at bedtime. 09/04/21  Yes [provider]  triamcinolone cream (KENALOG) 0.1 % Apply 1 Application topically 2 (two) times daily as needed (for itching).   Yes [provider]  TYLENOL PM EXTRA STRENGTH 500-25 MG TABS tablet Take 1 tablet by mouth at bedtime.   Yes [provider]  acetaminophen (TYLENOL) 500 MG tablet Take 1 tablet (500 mg total) by mouth every 6 (six) hours as needed for mild pain. Patient not taking: Reported on 01/18/2023 05/26/21   Standley Brooking, MD     Critical care time: N/A    Care Time: 60 min  Mechele Collin, M.D. [facility] Pulmonary/Critical Care Medicine 01/19/2023 8:50 AM   See Amion for personal pager For hours between 7 PM to 7 AM, please call Elink for urgent questions

## 2023-01-19 NOTE — Progress Notes (Signed)
PROGRESS NOTE    Richard Parrish  UJW:[id number] DOB: Jul 15, 1950 DOA: 01/18/2023 PCP: [name], MD   Brief Narrative: Richard Parrish is a 72 y.o. 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  Patient presented secondary to shortness of breath and was found to have evidence of interstitial pulmonary fibrosis exacerbation with possible pneumonia and complicated by acute on chronic hypoxic respiratory failure. Pulmonology consulted. Patient started on high-dose steroids and antibiotics.   Assessment/Plan:  Interstitial pulmonary fibrosis with exacerbation Pulmonology consulted. Patient started on solu-medrol IV. -Pulmonology recommendations: Continue IV solu-medrol -Continue pirfenidone  Acute on chronic respiratory failure Patient is on chronic 5 L/min with pulse dose oxygen with ambulation. Currently requiring up to 10 L/min of oxygen supplementation with ambulation. -Continue to wean to home oxygen as able -Treat IPF exacerbation +/- pneumonia  Possible community acquired pneumonia Patient started empirically on ceftriaxone and azithromycin. RVP, COVID-19, influenza and RSV negative. Blood cultures with no growth. -Continue Ceftriaxone and azithromycin  Primary hypertension Coreg held on admission secondary to low blood pressures. -Resume Coreg  History of PE/DVT -Continue Eliquis  Chronic pain -Continue Norco  Hyperlipidemia -Continue Lipitor  Depression Anxiety -Continue Paxil  BPH -Continue Flomax  GERD -Continue Protonix   DVT prophylaxis: Eliquis Code Status:   Code Status: Full Code Family Communication: Wife at bedside Disposition Plan: Discharge home pending ability to ambulate with less oxygen   Consultants:  PCCM  Procedures:  None  Antimicrobials: Ceftriaxone Azithromycin     Subjective: Patient reports feeling well. No dyspnea at rest. No chest pain.  Objective: BP (!) 152/82 (BP Location: Right Arm)   Pulse 78   Temp 98.2 F (36.8 C) (Oral)   Resp 19   Ht 6' (1.829 m)   Wt 83 kg   SpO2 95%   BMI 24.82 kg/m   Examination:  General exam: Appears calm and comfortable Respiratory system: Clear to auscultation. Respiratory effort normal. Cardiovascular system: S1 & S2 heard, RRR. Gastrointestinal system: Abdomen is nondistended, soft and nontender. Normal bowel sounds heard. Central nervous system: Alert and oriented. No focal neurological deficits. Musculoskeletal: No edema. No calf tenderness Skin: No cyanosis. No rashes Psychiatry: Judgement and insight appear normal. Mood & affect appropriate.    Data Reviewed: I have personally reviewed following labs and imaging studies   Last CBC Lab Results  Component Value Date   WBC 5.3 01/19/2023   HGB 12.7 (L) 01/19/2023   HCT 38.6 (L) 01/19/2023   MCV 96.5 01/19/2023   MCH 31.8 01/19/2023   RDW 11.6 01/19/2023   PLT 488 (H) 01/19/2023     Last metabolic panel Lab Results  Component Value Date   GLUCOSE 145 (H) 01/19/2023   NA 140 01/19/2023   K 3.5 01/19/2023   CL 105 01/19/2023   CO2 27 01/19/2023   BUN 18 01/19/2023   CREATININE 0.71 01/19/2023   GFRNONAA >60 01/19/2023   CALCIUM 9.4 01/19/2023   PROT 6.7 01/19/2023   ALBUMIN 3.1 (L) 01/19/2023   LABGLOB 1.9 02/15/2019   AGRATIO 2.4 (H) 02/15/2019   BILITOT 0.7 01/19/2023   ALKPHOS 65 01/19/2023   AST 27 01/19/2023   ALT 17 01/19/2023   ANIONGAP 8 01/19/2023     Creatinine Clearance: Estimated Creatinine Clearance: 90.3 mL/min (by C-G formula based on SCr of 0.71 mg/dL).  Recent Results (from the past 240 hour(s))  Blood  culture (routine x 2)     Status: None (Preliminary result)   Collection Time: 01/18/23  1:00 PM   Specimen: BLOOD  Result Value Ref Range Status   Specimen Description   Final    BLOOD RIGHT  UPPER ARM Performed at [facility], [address], [place], [place] [address]    Special Requests   Final    BOTTLES DRAWN AEROBIC AND ANAEROBIC Blood Culture adequate volume Performed at [facility], [address], [place], [place] [address]    Culture   Final    NO GROWTH < 12 HOURS Performed at [facility], [address], [place], [place] [address]    Report Status PENDING  Incomplete  Blood culture (routine x 2)     Status: None (Preliminary result)   Collection Time: 01/18/23  1:10 PM   Specimen: BLOOD  Result Value Ref Range Status   Specimen Description   Final    BLOOD RIGHT WRIST Performed at [facility], [address], [place], [place] [address]    Special Requests   Final    BOTTLES DRAWN AEROBIC AND ANAEROBIC Blood Culture adequate volume Performed at [facility], [address] [place], [place] [address]    Culture   Final    NO GROWTH < 12 HOURS Performed at [facility], [address], [place], [place] [address]    Report Status PENDING  Incomplete  Resp panel by RT-PCR (RSV, Flu A&B, Covid) Anterior Nasal Swab     Status: None   Collection Time: 01/18/23  1:35 PM   Specimen: Anterior Nasal Swab  Result Value Ref Range Status   SARS Coronavirus 2 by RT PCR NEGATIVE NEGATIVE Final    Comment: (NOTE) SARS-CoV-2 target nucleic acids are NOT DETECTED.  The SARS-CoV-2 RNA is generally detectable in upper respiratory specimens during the acute phase of infection. The lowest concentration of SARS-CoV-2 viral copies this assay can detect is 138 copies/mL. A negative result does not preclude SARS-Cov-2 infection and should not be used as the sole basis for treatment or other patient management decisions. A negative result may occur with  improper specimen collection/handling, submission of specimen other than nasopharyngeal swab, presence of viral mutation(s) within  the areas targeted by this assay, and inadequate number of viral copies(<138 copies/mL). A negative result must be combined with clinical observations, patient history, and epidemiological information. The expected result is Negative.  Fact Sheet for Patients:  BloggerCourse.com  Fact Sheet for Healthcare Providers:  SeriousBroker.it  This test is no t yet approved or cleared by the [place] FDA and  has been authorized for detection and/or diagnosis of SARS-CoV-2 by FDA under an Emergency Use Authorization (EUA). This EUA will remain  in effect (meaning this test can be used) for the duration of the COVID-19 declaration under Section 564(b)(1) of the Act, 21 U.S.C.section 360bbb-3(b)(1), unless the authorization is terminated  or revoked sooner.       Influenza A by PCR NEGATIVE NEGATIVE Final   Influenza B by PCR NEGATIVE NEGATIVE Final    Comment: (NOTE) The Xpert Xpress SARS-CoV-2/FLU/RSV plus assay is intended as an aid in the diagnosis of influenza from Nasopharyngeal swab specimens and should not be used as a sole basis for treatment. Nasal washings and aspirates are unacceptable for Xpert Xpress SARS-CoV-2/FLU/RSV testing.  Fact Sheet for Patients: BloggerCourse.com  Fact Sheet for Healthcare Providers: SeriousBroker.it  This test is not yet approved or cleared by  the [employer] and has been authorized for detection and/or diagnosis of SARS-CoV-2 by FDA under an Emergency Use Authorization (EUA). This EUA will remain in effect (meaning this test can be used) for the duration of the COVID-19 declaration under Section 564(b)(1) of the Act, 21 U.S.C. section 360bbb-3(b)(1), unless the authorization is terminated or revoked.     Resp Syncytial Virus by PCR NEGATIVE NEGATIVE Final    Comment: (NOTE) Fact Sheet for  Patients: BloggerCourse.com  Fact Sheet for Healthcare Providers: SeriousBroker.it  This test is not yet approved or cleared by the [place] FDA and has been authorized for detection and/or diagnosis of SARS-CoV-2 by FDA under an Emergency Use Authorization (EUA). This EUA will remain in effect (meaning this test can be used) for the duration of the COVID-19 declaration under Section 564(b)(1) of the Act, 21 U.S.C. section 360bbb-3(b)(1), unless the authorization is terminated or revoked.  Performed at [facility], [address] [place], [place] [address]   Respiratory (~20 pathogens) panel by PCR     Status: None   Collection Time: 01/18/23  4:46 PM   Specimen: Nasopharyngeal Swab; Respiratory  Result Value Ref Range Status   Adenovirus NOT DETECTED NOT DETECTED Final   Coronavirus 229E NOT DETECTED NOT DETECTED Final    Comment: (NOTE) The Coronavirus on the Respiratory Panel, DOES NOT test for the novel  Coronavirus (2019 nCoV)    Coronavirus HKU1 NOT DETECTED NOT DETECTED Final   Coronavirus NL63 NOT DETECTED NOT DETECTED Final   Coronavirus OC43 NOT DETECTED NOT DETECTED Final   Metapneumovirus NOT DETECTED NOT DETECTED Final   Rhinovirus / Enterovirus NOT DETECTED NOT DETECTED Final   Influenza A NOT DETECTED NOT DETECTED Final   Influenza B NOT DETECTED NOT DETECTED Final   Parainfluenza Virus 1 NOT DETECTED NOT DETECTED Final   Parainfluenza Virus 2 NOT DETECTED NOT DETECTED Final   Parainfluenza Virus 3 NOT DETECTED NOT DETECTED Final   Parainfluenza Virus 4 NOT DETECTED NOT DETECTED Final   Respiratory Syncytial Virus NOT DETECTED NOT DETECTED Final   Bordetella pertussis NOT DETECTED NOT DETECTED Final   Bordetella Parapertussis NOT DETECTED NOT DETECTED Final   Chlamydophila pneumoniae NOT DETECTED NOT DETECTED Final   Mycoplasma pneumoniae NOT DETECTED NOT DETECTED Final    Comment:  Performed at [facility], [address] [place], [place] [address]      Radiology Studies: CT Chest High Resolution  Result Date: 01/19/2023 CLINICAL DATA:  Inpatient. IPF. Acute on chronic hypoxic respiratory failure. History of right upper lobe lung cancer. * Tracking Code: BO * EXAM: CT CHEST WITHOUT CONTRAST TECHNIQUE: Multidetector CT imaging of the chest was performed following the standard protocol without intravenous contrast. High resolution imaging of the lungs, as well as inspiratory and expiratory imaging, was performed. RADIATION DOSE REDUCTION: This exam was performed according to the departmental dose-optimization program which includes automated exposure control, adjustment of the mA and/or kV according to patient size and/or use of iterative reconstruction technique. COMPARISON:  Chest radiograph from earlier today. 09/12/2022 chest CT. FINDINGS: Cardiovascular: Normal heart size. No significant pericardial effusion/thickening. Three-vessel coronary atherosclerosis status post CABG. Atherosclerotic thoracic aorta with dilated 4.0 cm ascending thoracic aorta. Normal caliber pulmonary arteries. Mediastinum/Nodes: No significant thyroid nodules. Unremarkable esophagus. No axillary adenopathy. Mildly enlarged 1.0 cm right subcarinal node (series 2/image 79), stable. No new pathologically enlarged mediastinal nodes. No discrete hilar adenopathy on these noncontrast images. Lungs/Pleura: No pneumothorax. Trace posterior bilateral pleural effusions, similar.  Status post right upper lobectomy. New patchy consolidation and ground-glass opacity throughout the posterior left upper lobe and superior segment left lower lobe. Background patchy extensive confluent reticulation and ground-glass opacity throughout the peripheral lungs bilaterally with associated moderate traction bronchiectasis, architectural distortion and volume loss with a clear basilar predominance and with moderate  honeycombing. These findings are not substantially changed since 07/12/2021 CT. No significant lobular air trapping or evidence of tracheobronchomalacia on the expiration sequence. No significant pulmonary nodules. Upper abdomen: Cholecystectomy. Musculoskeletal: No aggressive appearing focal osseous lesions. Moderate thoracic spondylosis. Intact sternotomy wires. IMPRESSION: 1. New patchy consolidation and ground-glass opacity throughout the posterior left upper lobe and superior segment left lower lobe, most compatible with multilobar pneumonia. Suggest attention on follow-up outpatient high-resolution chest CT in 3-6 months. 2. Background extensive basilar predominant fibrotic interstitial lung disease with moderate honeycombing, not substantially changed since 07/12/2021 CT. Findings are consistent with UIP per consensus guidelines: Diagnosis of Idiopathic Pulmonary Fibrosis: An Official ATS/ERS/JRS/ALAT Clinical Practice Guideline. Am Rosezetta Schlatter Crit Care Med Vol 198, Iss 5, [phone], Oct 29 2016. 3. Trace posterior bilateral pleural effusions, similar. 4. Stable mildly enlarged right subcarinal lymph node, nonspecific, probably reactive. 5.  Aortic Atherosclerosis (ICD10-I70.0). Electronically Signed   By: Delbert Phenix M.D.   On: 01/19/2023 15:00   DG Chest Port 1 View  Result Date: 01/18/2023 CLINICAL DATA:  Shortness of breath. EXAM: PORTABLE CHEST 1 VIEW COMPARISON:  Radiograph 11/25/2022.  CT 09/12/2022 FINDINGS: Prior median sternotomy. Stable heart size and mediastinal contours. Chronic volume loss in right hemithorax. Surgical clips at the right hilum. Diffuse reticular opacities, right greater than left, with progression from prior exam. There may be a small right pleural effusion. No evidence of confluent airspace disease. No pneumothorax. IMPRESSION: 1. Diffuse reticular opacities, right greater than left, with progression from prior exam. May represent progression of interstitial lung disease  versus superimposed infection. 2. Chronic volume loss in the right hemithorax. Electronically Signed   By: Narda Rutherford M.D.   On: 01/18/2023 16:44      LOS: 0 days    Jacquelin Hawking, MD [facility] 01/19/2023, 4:03 PM   If 7PM-7AM, please contact night-coverage [email or web address]

## 2023-01-20 DIAGNOSIS — I2510 Atherosclerotic heart disease of native coronary artery without angina pectoris: Secondary | ICD-10-CM | POA: Diagnosis not present

## 2023-01-20 DIAGNOSIS — I10 Essential (primary) hypertension: Secondary | ICD-10-CM | POA: Diagnosis not present

## 2023-01-20 DIAGNOSIS — D72810 Lymphocytopenia: Secondary | ICD-10-CM | POA: Diagnosis not present

## 2023-01-20 DIAGNOSIS — J9620 Acute and chronic respiratory failure, unspecified whether with hypoxia or hypercapnia: Secondary | ICD-10-CM | POA: Diagnosis present

## 2023-01-20 DIAGNOSIS — I252 Old myocardial infarction: Secondary | ICD-10-CM | POA: Diagnosis not present

## 2023-01-20 DIAGNOSIS — J189 Pneumonia, unspecified organism: Secondary | ICD-10-CM | POA: Diagnosis not present

## 2023-01-20 DIAGNOSIS — Z7901 Long term (current) use of anticoagulants: Secondary | ICD-10-CM | POA: Diagnosis not present

## 2023-01-20 DIAGNOSIS — Z951 Presence of aortocoronary bypass graft: Secondary | ICD-10-CM | POA: Diagnosis not present

## 2023-01-20 DIAGNOSIS — Z955 Presence of coronary angioplasty implant and graft: Secondary | ICD-10-CM | POA: Diagnosis not present

## 2023-01-20 DIAGNOSIS — Z888 Allergy status to other drugs, medicaments and biological substances status: Secondary | ICD-10-CM | POA: Diagnosis not present

## 2023-01-20 DIAGNOSIS — Z85118 Personal history of other malignant neoplasm of bronchus and lung: Secondary | ICD-10-CM | POA: Diagnosis not present

## 2023-01-20 DIAGNOSIS — Z8249 Family history of ischemic heart disease and other diseases of the circulatory system: Secondary | ICD-10-CM | POA: Diagnosis not present

## 2023-01-20 DIAGNOSIS — Z87891 Personal history of nicotine dependence: Secondary | ICD-10-CM | POA: Diagnosis not present

## 2023-01-20 DIAGNOSIS — F32A Depression, unspecified: Secondary | ICD-10-CM | POA: Diagnosis not present

## 2023-01-20 DIAGNOSIS — Z86711 Personal history of pulmonary embolism: Secondary | ICD-10-CM | POA: Diagnosis not present

## 2023-01-20 DIAGNOSIS — Z9981 Dependence on supplemental oxygen: Secondary | ICD-10-CM | POA: Diagnosis not present

## 2023-01-20 DIAGNOSIS — F419 Anxiety disorder, unspecified: Secondary | ICD-10-CM | POA: Diagnosis not present

## 2023-01-20 DIAGNOSIS — N400 Enlarged prostate without lower urinary tract symptoms: Secondary | ICD-10-CM | POA: Diagnosis not present

## 2023-01-20 DIAGNOSIS — Z79899 Other long term (current) drug therapy: Secondary | ICD-10-CM | POA: Diagnosis not present

## 2023-01-20 DIAGNOSIS — K219 Gastro-esophageal reflux disease without esophagitis: Secondary | ICD-10-CM | POA: Diagnosis not present

## 2023-01-20 DIAGNOSIS — J9621 Acute and chronic respiratory failure with hypoxia: Secondary | ICD-10-CM | POA: Diagnosis not present

## 2023-01-20 DIAGNOSIS — G8929 Other chronic pain: Secondary | ICD-10-CM | POA: Diagnosis not present

## 2023-01-20 DIAGNOSIS — E785 Hyperlipidemia, unspecified: Secondary | ICD-10-CM | POA: Diagnosis not present

## 2023-01-20 DIAGNOSIS — J84112 Idiopathic pulmonary fibrosis: Secondary | ICD-10-CM | POA: Diagnosis not present

## 2023-01-20 DIAGNOSIS — Z86718 Personal history of other venous thrombosis and embolism: Secondary | ICD-10-CM | POA: Diagnosis not present

## 2023-01-20 LAB — GLUCOSE, CAPILLARY: Glucose-Capillary: 126 mg/dL — ABNORMAL HIGH (ref 70–99)

## 2023-01-20 NOTE — Progress Notes (Signed)
Mobility Specialist - Progress Note  ([name]) Pre-mobility: 86 bpm HR, 98% SpO2 During mobility: 102 bpm HR, 86% SpO2 Post-mobility: 86 bpm HR, 92% SPO2  ([name]) Pre-mobility: 82 bpm HR, 94% SpO2 During mobility: 95 bpm HR, 87% SpO2 Post-mobility: 93 bpm HR, 92% SPO2   01/20/23 1134  Mobility  Activity Ambulated independently in hallway  Level of Assistance Independent after set-up  Assistive Device None  Distance Ambulated (ft) 320 ft  Range of Motion/Exercises Active  Activity Response Tolerated fair  Mobility Referral Yes  $Mobility charge 1 Mobility  Mobility Specialist Start Time (ACUTE ONLY) 1115  Mobility Specialist Stop Time (ACUTE ONLY) 1134  Mobility Specialist Time Calculation (min) (ACUTE ONLY) 19 min   Pt was found in bed and agreeable to ambulate. Ambulated about 145ft on [name] had x1 standing rest break due to SPO2 reading decreasing to 86%. Able to increase >90% within 1 min. C/o light headedness throughout ambulation. Returned to bed and RN requested Clinical research associate to ambulate pt on [name]. Pt agreeable to ambulating more. Pt ambulated about the same amount afterwards and had x1 standing rest break due to SPO2 decreasing to 87%. Able to increase >90% within 1 min. Stated not feeling lightheaded during ambulation. Returned to bed with all needs met. Call bell in reach and RN notified.   Billey Chang Mobility Specialist

## 2023-01-20 NOTE — Progress Notes (Signed)
   NAME:  Richard Parrish, MRN:  [id number], DOB:  07/04/1950, LOS: 0 ADMISSION DATE:  01/18/2023, CONSULTATION DATE:  01/19/23 REFERRING MD:  Jacquelin Hawking, MD CHIEF COMPLAINT:  ILD exacerbation   History of Present Illness:  72 year old male with chronic hypoxemic respiratory failure on 5L secondary to IPF who presented for shortness of breath. He is followed by Dr. Vassie Loll at [facility] who advised him to be evaluated for IPF exacerbation. He reports for the last week he has had progressive shortness of breath with activity. 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 Has not had to increase his home O2 levels however in the ED noted to have SpO2 79% initially. On 5L will decrease to 92% with severe dyspnea. Has baseline productive cough that is unchanged. Denies fevers, chills, wheezing. Denies chest pain or lower extremity edema.  RVP neg. Procal neg. LA 1.0. CBC and CMET unremarkable without evidence of leukocytosis. WBC 7.6 with lymphopenia so possible viral. Today K 5.9  Pertinent  Medical History  RUL adenocarcinoma s/p bronchoscopy  and resection 02/2021, hx recurrent pneumothorax, submassive pulmonary embolism, LLE DVT (04/2021), idiopathic pulmonary fibrosis, HLD, chronic pain, depression  Significant Hospital Events: Including procedures, antibiotic start and stop dates in addition to other pertinent events   11/20 Admitted to [facility]  Interim History / Subjective:  Unable to discharge today due to increased O2 requirement. Today mobilized and still required up to 10L O2  Objective   Blood pressure (!) 150/91, pulse 69, temperature 97.8 F (36.6 C), temperature source Oral, resp. rate 18, height 6' (1.829 m), weight 83 kg, SpO2 94%.        Intake/Output Summary (Last 24 hours) at 01/20/2023 1209 Last data filed at 01/20/2023 0300 Gross per 24 hour  Intake 220.15 ml  Output --  Net 220.15 ml    Filed Weights   01/18/23 1233 01/19/23 0500  Weight: 84.8 kg 83 kg   Physical Exam: General: Well-appearing, no acute distress HENT: [place], AT Eyes: EOMI, no scleral icterus Respiratory: Inspiratory crackles at bases Cardiovascular: RRR, -M/R/G, no JVD Extremities:-Edema,-tenderness Neuro: AAO x4, CNII-XII grossly intact Psych: Normal mood, normal affect   Resolved Hospital Problem list   N/A  Assessment & Plan:  ILD/IPF exacerbation Chronic hypoxemic respiratory failure -CT Chest 11/21 with increased interstitial and septal thickening bilaterally with interval involvement left lung, increased at the bases on my interpretation. May be progression of ILD but reasonable to treat with steroids to minimize inflammation especially with the findings seen on the left side Continue solumedrol 80 mg BID. Will need taper when closer to discharge Complete 5 days of CAP coverate Continue supplemental O2 for goal >88%. Currently requiring 10L on exertion Will need ambulatory O2 prior to discharge to determine home O2 needs Resume home pirfenidone at discharge  Hx pulmonary embolism --Continue home eliquis  Best Practice (right click and "Reselect all SmartList Selections" daily)   Diet/type: Regular consistency (see orders) DVT prophylaxis: DOAC GI prophylaxis: PPI Lines: N/A Foley:  N/A Code Status:  full code Last date of multidisciplinary goals of care discussion [ ]  per primary  Critical care time: N/A    Care Time: 35 min  Mechele Collin, M.D. [facility]Critical [facility] 01/20/2023 12:09 PM   See Amion for personal pager For hours between 7 PM to 7 AM, please call Elink for urgent questions

## 2023-01-20 NOTE — Progress Notes (Signed)
PROGRESS NOTE    Richard Parrish  WGN:[id number] DOB: [birth year] DOA: 01/18/2023 PCP: [name], MD   Brief Narrative: Richard Parrish is a 72 y.o. 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  Patient presented secondary to shortness of breath and was found to have evidence of interstitial pulmonary fibrosis exacerbation with possible pneumonia and complicated by acute on chronic hypoxic respiratory failure. Pulmonology consulted. Patient started on high-dose steroids and antibiotics.   Assessment/Plan:  Interstitial pulmonary fibrosis with exacerbation Pulmonology consulted. Patient started on solu-medrol IV. -Pulmonology recommendations: Continue IV solu-medrol BID -Continue pirfenidone  Acute on chronic respiratory failure Patient is on chronic 5 L/min with pulse dose oxygen with ambulation. Currently requiring up to 10 L/min of oxygen supplementation with ambulation. -Continue to wean to home oxygen as able -Treat IPF exacerbation +/- pneumonia  Possible community acquired pneumonia Patient started empirically on ceftriaxone and azithromycin. RVP, COVID-19, influenza and RSV negative. Blood cultures with no growth. -Continue Ceftriaxone and azithromycin x5 day course  Primary hypertension Coreg held on admission secondary to low blood pressures. -Continue Coreg  History of PE/DVT -Continue Eliquis  Chronic pain -Continue Norco  Hyperlipidemia -Continue Lipitor  Depression Anxiety -Continue Paxil and Xanax prn  BPH -Continue Flomax  GERD -Continue Protonix   DVT prophylaxis: Eliquis Code Status:   Code Status: Full Code Family Communication: Wife at bedside Disposition Plan: Discharge home pending ability to ambulate with less oxygen. Likely 3-5 days   Consultants:  PCCM  Procedures:   None  Antimicrobials: Ceftriaxone Azithromycin    Subjective: Still requiring a significant amount of oxygen with mobility. Otherwise, patient feels well.  Objective: BP (!) 150/91 (BP Location: Left Arm)   Pulse 69   Temp 97.8 F (36.6 C) (Oral)   Resp 18   Ht 6' (1.829 m)   Wt 83 kg   SpO2 94%   BMI 24.82 kg/m   Examination:  General exam: Appears calm and comfortable Respiratory system: Clear to auscultation. Respiratory effort with progressive laboring with continued speaking Cardiovascular system: S1 & S2 heard, RRR. No murmurs, rubs, gallops or clicks. Gastrointestinal system: Abdomen is nondistended, soft and nontender. Normal bowel sounds heard. Central nervous system: Alert and oriented. No focal neurological deficits. Musculoskeletal: No edema. No calf tenderness Psychiatry: Judgement and insight appear normal. Mood & affect appropriate.    Data Reviewed: I have personally reviewed following labs and imaging studies   Last CBC Lab Results  Component Value Date   WBC 5.3 01/19/2023   HGB 12.7 (L) 01/19/2023   HCT 38.6 (L) 01/19/2023   MCV 96.5 01/19/2023   MCH 31.8 01/19/2023   RDW 11.6 01/19/2023   PLT 488 (H) 01/19/2023     Last metabolic panel Lab Results  Component Value Date   GLUCOSE 145 (H) 01/19/2023   NA 140 01/19/2023   K 3.5 01/19/2023   CL 105 01/19/2023   CO2 27 01/19/2023   BUN 18 01/19/2023   CREATININE 0.71 01/19/2023   GFRNONAA >60 01/19/2023   CALCIUM 9.4 01/19/2023   PROT 6.7 01/19/2023   ALBUMIN 3.1 (L) 01/19/2023   LABGLOB 1.9 02/15/2019   AGRATIO 2.4 (H) 02/15/2019   BILITOT 0.7 01/19/2023   ALKPHOS 65 01/19/2023   AST 27 01/19/2023   ALT 17 01/19/2023   ANIONGAP 8 01/19/2023     Creatinine Clearance: Estimated Creatinine Clearance: 90.3 mL/min (by  C-G formula based on SCr of 0.71 mg/dL).  Recent Results (from the past 240 hour(s))  Blood culture (routine x 2)     Status: None (Preliminary result)    Collection Time: 01/18/23  1:00 PM   Specimen: BLOOD RIGHT ARM  Result Value Ref Range Status   Specimen Description   Final    BLOOD RIGHT ARM Performed at [facility], [address] [place], [place] [address]    Special Requests   Final    BOTTLES DRAWN AEROBIC AND ANAEROBIC Blood Culture adequate volume Performed at [facility], [address] [place], [place] [address]    Culture   Final    NO GROWTH 2 DAYS Performed at [facility], [address], [place], [place] [address]    Report Status PENDING  Incomplete  Blood culture (routine x 2)     Status: None (Preliminary result)   Collection Time: 01/18/23  1:10 PM   Specimen: BLOOD RIGHT WRIST  Result Value Ref Range Status   Specimen Description   Final    BLOOD RIGHT WRIST Performed at [facility], [address], [place], [place] [address]    Special Requests   Final    BOTTLES DRAWN AEROBIC AND ANAEROBIC Blood Culture adequate volume Performed at [facility], [address], [place], [place] [address]    Culture   Final    NO GROWTH 2 DAYS Performed at [facility], [address], [place], [place] [address]    Report Status PENDING  Incomplete  Resp panel by RT-PCR (RSV, Flu A&B, Covid) Anterior Nasal Swab     Status: None   Collection Time: 01/18/23  1:35 PM   Specimen: Anterior Nasal Swab  Result Value Ref Range Status   SARS Coronavirus 2 by RT PCR NEGATIVE NEGATIVE Final    Comment: (NOTE) SARS-CoV-2 target nucleic acids are NOT DETECTED.  The SARS-CoV-2 RNA is generally detectable in upper respiratory specimens during the acute phase of infection. The lowest concentration of SARS-CoV-2 viral copies this assay can detect is 138 copies/mL. A negative result does not preclude SARS-Cov-2 infection and should not be used as the sole basis for treatment or other patient management decisions. A negative result may occur with  improper specimen  collection/handling, submission of specimen other than nasopharyngeal swab, presence of viral mutation(s) within the areas targeted by this assay, and inadequate number of viral copies(<138 copies/mL). A negative result must be combined with clinical observations, patient history, and epidemiological information. The expected result is Negative.  Fact Sheet for Patients:  BloggerCourse.com  Fact Sheet for Healthcare Providers:  SeriousBroker.it  This test is no t yet approved or cleared by the [place] FDA and  has been authorized for detection and/or diagnosis of SARS-CoV-2 by FDA under an Emergency Use Authorization (EUA). This EUA will remain  in effect (meaning this test can be used) for the duration of the COVID-19 declaration under Section 564(b)(1) of the Act, 21 U.S.C.section 360bbb-3(b)(1), unless the authorization is terminated  or revoked sooner.       Influenza A by PCR NEGATIVE NEGATIVE Final   Influenza B by PCR NEGATIVE NEGATIVE Final    Comment: (NOTE) The Xpert Xpress SARS-CoV-2/FLU/RSV plus assay is intended as an aid in the diagnosis of influenza from Nasopharyngeal swab specimens and should not be used as a sole basis for treatment. Nasal washings and aspirates are unacceptable for Xpert Xpress SARS-CoV-2/FLU/RSV testing.  Fact  Sheet for Patients: BloggerCourse.com  Fact Sheet for Healthcare Providers: SeriousBroker.it  This test is not yet approved or cleared by the [place] FDA and has been authorized for detection and/or diagnosis of SARS-CoV-2 by FDA under an Emergency Use Authorization (EUA). This EUA will remain in effect (meaning this test can be used) for the duration of the COVID-19 declaration under Section 564(b)(1) of the Act, 21 U.S.C. section 360bbb-3(b)(1), unless the authorization is terminated or revoked.     Resp Syncytial  Virus by PCR NEGATIVE NEGATIVE Final    Comment: (NOTE) Fact Sheet for Patients: BloggerCourse.com  Fact Sheet for Healthcare Providers: SeriousBroker.it  This test is not yet approved or cleared by the [place] FDA and has been authorized for detection and/or diagnosis of SARS-CoV-2 by FDA under an Emergency Use Authorization (EUA). This EUA will remain in effect (meaning this test can be used) for the duration of the COVID-19 declaration under Section 564(b)(1) of the Act, 21 U.S.C. section 360bbb-3(b)(1), unless the authorization is terminated or revoked.  Performed at [facility], [address], [place], [place] [address]   Respiratory (~20 pathogens) panel by PCR     Status: None   Collection Time: 01/18/23  4:46 PM   Specimen: Nasopharyngeal Swab; Respiratory  Result Value Ref Range Status   Adenovirus NOT DETECTED NOT DETECTED Final   Coronavirus 229E NOT DETECTED NOT DETECTED Final    Comment: (NOTE) The Coronavirus on the Respiratory Panel, DOES NOT test for the novel  Coronavirus (2019 nCoV)    Coronavirus HKU1 NOT DETECTED NOT DETECTED Final   Coronavirus NL63 NOT DETECTED NOT DETECTED Final   Coronavirus OC43 NOT DETECTED NOT DETECTED Final   Metapneumovirus NOT DETECTED NOT DETECTED Final   Rhinovirus / Enterovirus NOT DETECTED NOT DETECTED Final   Influenza A NOT DETECTED NOT DETECTED Final   Influenza B NOT DETECTED NOT DETECTED Final   Parainfluenza Virus 1 NOT DETECTED NOT DETECTED Final   Parainfluenza Virus 2 NOT DETECTED NOT DETECTED Final   Parainfluenza Virus 3 NOT DETECTED NOT DETECTED Final   Parainfluenza Virus 4 NOT DETECTED NOT DETECTED Final   Respiratory Syncytial Virus NOT DETECTED NOT DETECTED Final   Bordetella pertussis NOT DETECTED NOT DETECTED Final   Bordetella Parapertussis NOT DETECTED NOT DETECTED Final   Chlamydophila pneumoniae NOT DETECTED NOT DETECTED Final    Mycoplasma pneumoniae NOT DETECTED NOT DETECTED Final    Comment: Performed at [facility], [address] [place], [place] [address]      Radiology Studies: CT Chest High Resolution  Result Date: 01/19/2023 CLINICAL DATA:  Inpatient. IPF. Acute on chronic hypoxic respiratory failure. History of right upper lobe lung cancer. * Tracking Code: BO * EXAM: CT CHEST WITHOUT CONTRAST TECHNIQUE: Multidetector CT imaging of the chest was performed following the standard protocol without intravenous contrast. High resolution imaging of the lungs, as well as inspiratory and expiratory imaging, was performed. RADIATION DOSE REDUCTION: This exam was performed according to the departmental dose-optimization program which includes automated exposure control, adjustment of the mA and/or kV according to patient size and/or use of iterative reconstruction technique. COMPARISON:  Chest radiograph from earlier today. 09/12/2022 chest CT. FINDINGS: Cardiovascular: Normal heart size. No significant pericardial effusion/thickening. Three-vessel coronary atherosclerosis status post CABG. Atherosclerotic thoracic aorta with dilated 4.0 cm ascending thoracic aorta. Normal caliber pulmonary arteries. Mediastinum/Nodes: No significant thyroid nodules. Unremarkable esophagus. No axillary adenopathy. Mildly enlarged 1.0 cm right subcarinal node (series 2/image 79), stable. No new  pathologically enlarged mediastinal nodes. No discrete hilar adenopathy on these noncontrast images. Lungs/Pleura: No pneumothorax. Trace posterior bilateral pleural effusions, similar. Status post right upper lobectomy. New patchy consolidation and ground-glass opacity throughout the posterior left upper lobe and superior segment left lower lobe. Background patchy extensive confluent reticulation and ground-glass opacity throughout the peripheral lungs bilaterally with associated moderate traction bronchiectasis, architectural distortion and volume  loss with a clear basilar predominance and with moderate honeycombing. These findings are not substantially changed since 07/12/2021 CT. No significant lobular air trapping or evidence of tracheobronchomalacia on the expiration sequence. No significant pulmonary nodules. Upper abdomen: Cholecystectomy. Musculoskeletal: No aggressive appearing focal osseous lesions. Moderate thoracic spondylosis. Intact sternotomy wires. IMPRESSION: 1. New patchy consolidation and ground-glass opacity throughout the posterior left upper lobe and superior segment left lower lobe, most compatible with multilobar pneumonia. Suggest attention on follow-up outpatient high-resolution chest CT in 3-6 months. 2. Background extensive basilar predominant fibrotic interstitial lung disease with moderate honeycombing, not substantially changed since 07/12/2021 CT. Findings are consistent with UIP per consensus guidelines: Diagnosis of Idiopathic Pulmonary Fibrosis: An Official ATS/ERS/JRS/ALAT Clinical Practice Guideline. Am Rosezetta Schlatter Crit Care Med Vol 198, Iss 5, [phone], Oct 29 2016. 3. Trace posterior bilateral pleural effusions, similar. 4. Stable mildly enlarged right subcarinal lymph node, nonspecific, probably reactive. 5.  Aortic Atherosclerosis (ICD10-I70.0). Electronically Signed   By: Delbert Phenix M.D.   On: 01/19/2023 15:00   DG Chest Port 1 View  Result Date: 01/18/2023 CLINICAL DATA:  Shortness of breath. EXAM: PORTABLE CHEST 1 VIEW COMPARISON:  Radiograph 11/25/2022.  CT 09/12/2022 FINDINGS: Prior median sternotomy. Stable heart size and mediastinal contours. Chronic volume loss in right hemithorax. Surgical clips at the right hilum. Diffuse reticular opacities, right greater than left, with progression from prior exam. There may be a small right pleural effusion. No evidence of confluent airspace disease. No pneumothorax. IMPRESSION: 1. Diffuse reticular opacities, right greater than left, with progression from prior exam.  May represent progression of interstitial lung disease versus superimposed infection. 2. Chronic volume loss in the right hemithorax. Electronically Signed   By: Narda Rutherford M.D.   On: 01/18/2023 16:44      LOS: 0 days    Jacquelin Hawking, MD [facility] 01/20/2023, 1:00 PM   If 7PM-7AM, please contact night-coverage [email or web address]

## 2023-01-20 NOTE — TOC Initial Note (Signed)
Transition of Care [facility]) - Initial/Assessment Note    Patient Details  Name: Richard Parrish MRN: [id number] Date of Birth: January 23, 1951  [facility]) CM/SW Contact:    Howell Rucks, RN Phone Number: 01/20/2023, 11:41 AM  Clinical Narrative:  Met with pt and spouse and bedside to introduce role of TOC/NCM and review for dc planning, spouse answered assessment questions. Spouse reports pt has an established PCP and pharmacy, no current home care services, reports pt has Home 02, other needed DME. TOC will continue to follow.                 Expected Discharge Plan: Home/Self Care Barriers to Discharge: Continued Medical Work up   Patient Goals and CMS Choice Patient states their goals for this hospitalization and ongoing recovery are:: return home with spouse          Expected Discharge Plan and Services       Living arrangements for the past 2 months: Single Family Home                                      Prior Living Arrangements/Services Living arrangements for the past 2 months: Single Family Home Lives with:: Spouse Patient language and need for interpreter reviewed:: Yes        Need for Family Participation in Patient Care: Yes (Comment) Care giver support system in place?: Yes (comment) Current home services: DME (Home 02) Criminal Activity/Legal Involvement Pertinent to Current Situation/Hospitalization: No - Comment as needed  Activities of Daily Living   ADL Screening (condition at time of admission) Independently performs ADLs?: Yes (appropriate for developmental age) Is the patient deaf or have difficulty hearing?: Yes Does the patient have difficulty seeing, even when wearing glasses/contacts?: No Does the patient have difficulty concentrating, remembering, or making decisions?: No  Permission Sought/Granted                  Emotional Assessment Appearance:: Appears stated age Attitude/Demeanor/Rapport: Gracious Affect  (typically observed): Accepting Orientation: : Oriented to Self, Oriented to Place, Oriented to  Time, Oriented to Situation Alcohol / Substance Use: Not Applicable Psych Involvement: No (comment)  Admission diagnosis:  Acute on chronic respiratory failure with hypoxia (HCC) [J96.21] Community acquired pneumonia of left lower lobe of lung [J18.9] Acute on chronic hypoxic respiratory failure (HCC) [J96.21] Acute on chronic respiratory failure (HCC) [J96.20] Patient Active Problem List   Diagnosis Date Noted   Acute on chronic respiratory failure (HCC) 01/20/2023   Acute on chronic respiratory failure with hypoxia (HCC) 01/18/2023   Acute on chronic hypoxic respiratory failure (HCC) 01/18/2023   Emphysema lung (HCC) 05/26/2021   Acute deep vein thrombosis (DVT) of left peroneal vein (HCC) 05/26/2021   Leukocytosis 05/24/2021   Orthostasis 05/23/2021   Pulmonary embolism (HCC) 05/18/2021   Acute respiratory failure with hypoxia (HCC) 05/18/2021   Acute exacerbation of idiopathic pulmonary fibrosis (HCC) 05/18/2021   IPF (idiopathic pulmonary fibrosis) (HCC) 05/18/2021   Recurrent right pleural effusion 04/25/2021   Pleural effusion on right 04/25/2021   Hypotension 04/20/2021   BPH (benign prostatic hyperplasia) 04/19/2021   Pneumothorax 04/13/2021   Tobacco user 03/19/2021   Severe major depression, single episode, without psychotic features (HCC) 03/19/2021   Sciatica 03/19/2021   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   Obstructive sleep apnea syndrome 03/19/2021   Malignant neoplasm of upper lobe, right bronchus or lung (HCC)  03/19/2021   Lymphocytic colitis 03/19/2021   Low back pain 03/19/2021   Irritable bowel syndrome 03/19/2021   Insomnia 03/19/2021   Frequency of micturition 03/19/2021   Confusional state 03/19/2021   Chronic pain syndrome 03/19/2021   Gastroesophageal reflux disease 03/19/2021   Anxiety 03/19/2021   Amnesia 03/19/2021   Acquired absence of  lung (part of) 03/19/2021   Malignant neoplasm of upper lobe of right lung (HCC) 03/08/2021   Lung nodule 03/03/2021   S/P Robotic Assisted Right Video Assisted Thoracoscopy with Right Upper Lobectomy, Right Lower Lobe wedge resection 03/03/2021   Malnutrition of moderate degree 10/05/2020   Shortness of breath    Unstable angina (HCC) 10/03/2020   Chest pain    Hx of CABG    Disc degeneration, lumbar 02/10/2018   Aortic atherosclerosis (HCC) 11/13/2017   Pure hypercholesterolemia 11/13/2017   Degenerative joint disease of left hip 04/07/2017   Right hip pain 06/01/2016   Achilles tendon pain 12/31/2015   Postinflammatory pulmonary fibrosis (HCC) 07/24/2013   Cough 07/24/2013   CAD (coronary artery disease), native coronary artery 04/01/2013   Essential hypertension, benign 12/12/2012    Class: Chronic   HLD (hyperlipidemia) 12/12/2012   Atherosclerosis of native artery of extremity with intermittent claudication (HCC) 11/25/2011   Peripheral vascular disease, unspecified (HCC) 02/28/2011   PCP:  [name], MD Pharmacy:   [id number], [place] - [address] AT [facility] [address] [place] [address] Phone: [phone] Fax: [phone]  [facility] - [place], [place] - [address] [place] [address] [place] [address] [place] [address] Phone: [phone] Fax: [phone]     Social Determinants of Health (SDOH) Social History: SDOH Screenings   Food Insecurity: No Food Insecurity (01/18/2023)  Housing: Low Risk  (01/18/2023)  Transportation Needs: No Transportation Needs (01/18/2023)  Utilities: Not At Risk (01/18/2023)  Depression (PHQ2-9): High Risk (11/22/2021)  Tobacco Use: Medium Risk (01/18/2023)   SDOH Interventions:     Readmission Risk Interventions    01/20/2023   11:38 AM 05/22/2021    9:37 AM 04/20/2021    8:25 AM  Readmission Risk Prevention Plan  Post Dischage Appt Complete     Medication Screening Complete    Transportation Screening Complete Complete Complete  PCP or Specialist Appt within 5-7 Days   Complete  Home Care Screening   Complete  Medication Review (RN CM)   Complete  Medication Review (RN Care Manager)  Complete   PCP or Specialist appointment within 3-5 days of discharge  Complete   HRI or Home Care Consult  Complete   SW Recovery Care/Counseling Consult  Complete   Palliative Care Screening  Not Applicable   Skilled Nursing Facility  Complete

## 2023-01-20 NOTE — Progress Notes (Signed)
Mobility Specialist - Progress Note  Pre-mobility: 86 bpm HR, 94% SpO2 During mobility: 102 bpm HR, 83% SpO2 Post-mobility: 68 bpm HR, 96% SPO2   01/20/23 1455  Mobility  Activity Ambulated independently in hallway  Level of Assistance Independent after set-up  Assistive Device None  Distance Ambulated (ft) 50 ft  Range of Motion/Exercises Active  Activity Response Tolerated well  Mobility Referral Yes  $Mobility charge 1 Mobility  Mobility Specialist Start Time (ACUTE ONLY) 1445  Mobility Specialist Stop Time (ACUTE ONLY) 1455  Mobility Specialist Time Calculation (min) (ACUTE ONLY) 10 min   Pt was found in bed and agreeable to ambulate. Stated feeling light headed and unwell which limited distance. Pt decreased to 83% at about 67ft and took x1 standing rest break. Took about 2 min to increase SPO2 to 90%. Returned to bed with all needs met. Call bell in reach and wife in room.  Billey Chang Mobility Specialist

## 2023-01-21 DIAGNOSIS — J9621 Acute and chronic respiratory failure with hypoxia: Secondary | ICD-10-CM | POA: Diagnosis not present

## 2023-01-21 DIAGNOSIS — J84112 Idiopathic pulmonary fibrosis: Secondary | ICD-10-CM | POA: Diagnosis not present

## 2023-01-21 LAB — CBC
HCT: 39.3 % (ref 39.0–52.0)
Hemoglobin: 12.9 g/dL — ABNORMAL LOW (ref 13.0–17.0)
MCH: 32.1 pg (ref 26.0–34.0)
MCHC: 32.8 g/dL (ref 30.0–36.0)
MCV: 97.8 fL (ref 80.0–100.0)
Platelets: 540 10*3/uL — ABNORMAL HIGH (ref 150–400)
RBC: 4.02 MIL/uL — ABNORMAL LOW (ref 4.22–5.81)
RDW: 11.6 % (ref 11.5–15.5)
WBC: 10.2 10*3/uL (ref 4.0–10.5)
nRBC: 0 % (ref 0.0–0.2)

## 2023-01-21 LAB — BASIC METABOLIC PANEL
Anion gap: 9 (ref 5–15)
BUN: 21 mg/dL (ref 8–23)
CO2: 24 mmol/L (ref 22–32)
Calcium: 9.1 mg/dL (ref 8.9–10.3)
Chloride: 104 mmol/L (ref 98–111)
Creatinine, Ser: 0.7 mg/dL (ref 0.61–1.24)
GFR, Estimated: >60 mL/min (ref >60)
Glucose, Bld: 180 mg/dL — ABNORMAL HIGH (ref 70–99)
Potassium: 4 mmol/L (ref 3.5–5.1)
Sodium: 137 mmol/L (ref 135–145)

## 2023-01-21 LAB — GLUCOSE, CAPILLARY: Glucose-Capillary: 125 mg/dL — ABNORMAL HIGH (ref 70–99)

## 2023-01-21 MED ORDER — ARFORMOTEROL TARTRATE 15 MCG/2ML IN NEBU
15.0000 ug | INHALATION_SOLUTION | Freq: Two times a day (BID) | RESPIRATORY_TRACT | Status: DC
  Administered 2023-01-21 – 2023-01-23 (×5): 15 ug via RESPIRATORY_TRACT
  Filled 2023-01-21 (×5): qty 2

## 2023-01-21 MED ORDER — METHOCARBAMOL 500 MG PO TABS
750.0000 mg | ORAL_TABLET | Freq: Three times a day (TID) | ORAL | Status: DC | PRN
  Administered 2023-01-21 – 2023-01-22 (×3): 750 mg via ORAL
  Filled 2023-01-21 (×3): qty 2

## 2023-01-21 MED ORDER — CARMEX CLASSIC LIP BALM EX OINT
TOPICAL_OINTMENT | CUTANEOUS | Status: DC | PRN
  Filled 2023-01-21: qty 10

## 2023-01-21 MED ORDER — BUDESONIDE 0.5 MG/2ML IN SUSP
0.5000 mg | Freq: Two times a day (BID) | RESPIRATORY_TRACT | Status: DC
  Administered 2023-01-21 – 2023-01-23 (×5): 0.5 mg via RESPIRATORY_TRACT
  Filled 2023-01-21 (×5): qty 2

## 2023-01-21 MED ORDER — METHOCARBAMOL 500 MG PO TABS
500.0000 mg | ORAL_TABLET | Freq: Three times a day (TID) | ORAL | Status: DC | PRN
Start: 2023-01-21 — End: 2023-01-21

## 2023-01-21 NOTE — Progress Notes (Signed)
PROGRESS NOTE    Richard Parrish  ZOX:[id number] DOB: [birth year] DOA: 01/18/2023 PCP: [name], MD   Brief Narrative: Richard Parrish is a 72 y.o. 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  Patient presented secondary to shortness of breath and was found to have evidence of interstitial pulmonary fibrosis exacerbation with possible pneumonia and complicated by acute on chronic hypoxic respiratory failure. Pulmonology consulted. Patient started on high-dose steroids and antibiotics.   Assessment/Plan:  Interstitial pulmonary fibrosis with exacerbation Pulmonology consulted. Patient started on solu-medrol IV. -Pulmonology recommendations: Continue IV solu-medrol BID -Continue pirfenidone  Acute on chronic respiratory failure Patient is on chronic 5 L/min with pulse dose oxygen with ambulation. Currently requiring up to 10 L/min of oxygen supplementation with ambulation. -Continue to wean to home oxygen as able -Treat IPF exacerbation +/- pneumonia  Possible community acquired pneumonia Patient started empirically on ceftriaxone and azithromycin. RVP, COVID-19, influenza and RSV negative. Blood cultures with no growth. -Continue Ceftriaxone and azithromycin x5 day course  Primary hypertension Coreg held on admission secondary to low blood pressures. -Continue Coreg  History of PE/DVT -Continue Eliquis  Chronic pain -Continue Norco  Hyperlipidemia -Continue Lipitor  Depression Anxiety -Continue Paxil and Xanax prn  BPH -Continue Flomax  GERD -Continue Protonix   DVT prophylaxis: Eliquis Code Status:   Code Status: Full Code Family Communication: Wife at bedside Disposition Plan: Discharge home pending ability to ambulate with less oxygen. Likely 2-5 days   Consultants:  PCCM  Procedures:   None  Antimicrobials: Ceftriaxone Azithromycin    Subjective: Patient reports feeling much better from overnight into this morning. Not feeling as short of breath.  Objective: BP (!) 144/85 (BP Location: Right Arm)   Pulse 70   Temp 97.6 F (36.4 C) (Oral)   Resp 20   Ht 6' (1.829 m)   Wt 80.9 kg   SpO2 95%   BMI 24.19 kg/m   Examination:  General exam: Appears calm and comfortable Respiratory system: Clear to auscultation. Respiratory effort normal at rest. Does not appear to be short of breath with speaking. Cardiovascular system: S1 & S2 heard, RRR. Gastrointestinal system: Abdomen is nondistended, soft and nontender. Normal bowel sounds heard. Central nervous system: Alert and oriented. Psychiatry: Judgement and insight appear normal. Mood & affect appropriate.    Data Reviewed: I have personally reviewed following labs and imaging studies   Last CBC Lab Results  Component Value Date   WBC 5.3 01/19/2023   HGB 12.7 (L) 01/19/2023   HCT 38.6 (L) 01/19/2023   MCV 96.5 01/19/2023   MCH 31.8 01/19/2023   RDW 11.6 01/19/2023   PLT 488 (H) 01/19/2023     Last metabolic panel Lab Results  Component Value Date   GLUCOSE 145 (H) 01/19/2023   NA 140 01/19/2023   K 3.5 01/19/2023   CL 105 01/19/2023   CO2 27 01/19/2023   BUN 18 01/19/2023   CREATININE 0.71 01/19/2023   GFRNONAA >60 01/19/2023   CALCIUM 9.4 01/19/2023   PROT 6.7 01/19/2023   ALBUMIN 3.1 (L) 01/19/2023   LABGLOB 1.9 02/15/2019   AGRATIO 2.4 (H) 02/15/2019   BILITOT 0.7 01/19/2023   ALKPHOS 65 01/19/2023   AST 27 01/19/2023   ALT 17 01/19/2023   ANIONGAP 8 01/19/2023     Creatinine Clearance: Estimated Creatinine Clearance: 90.3 mL/min (by C-G formula based on SCr of  0.71 mg/dL).  Recent Results (from the past 240 hour(s))  Blood culture (routine x 2)     Status: None (Preliminary result)   Collection Time: 01/18/23  1:00 PM   Specimen: BLOOD RIGHT ARM  Result Value Ref Range  Status   Specimen Description   Final    BLOOD RIGHT ARM Performed at [facility], [address] [place], [place] [address]    Special Requests   Final    BOTTLES DRAWN AEROBIC AND ANAEROBIC Blood Culture adequate volume Performed at [facility], [address] [place], [place] [address]    Culture   Final    NO GROWTH 3 DAYS Performed at [facility], [address] [place], [place] [address]    Report Status PENDING  Incomplete  Blood culture (routine x 2)     Status: None (Preliminary result)   Collection Time: 01/18/23  1:10 PM   Specimen: BLOOD RIGHT WRIST  Result Value Ref Range Status   Specimen Description   Final    BLOOD RIGHT WRIST Performed at [facility], [address], [place], [place] [address]    Special Requests   Final    BOTTLES DRAWN AEROBIC AND ANAEROBIC Blood Culture adequate volume Performed at [facility], [address], [place], [place] [address]    Culture   Final    NO GROWTH 3 DAYS Performed at [facility], [address] [place], [place] [address]    Report Status PENDING  Incomplete  Resp panel by RT-PCR (RSV, Flu A&B, Covid) Anterior Nasal Swab     Status: None   Collection Time: 01/18/23  1:35 PM   Specimen: Anterior Nasal Swab  Result Value Ref Range Status   SARS Coronavirus 2 by RT PCR NEGATIVE NEGATIVE Final    Comment: (NOTE) SARS-CoV-2 target nucleic acids are NOT DETECTED.  The SARS-CoV-2 RNA is generally detectable in upper respiratory specimens during the acute phase of infection. The lowest concentration of SARS-CoV-2 viral copies this assay can detect is 138 copies/mL. A negative result does not preclude SARS-Cov-2 infection and should not be used as the sole basis for treatment or other patient management decisions. A negative result may occur with  improper specimen collection/handling, submission of specimen other than nasopharyngeal swab, presence of  viral mutation(s) within the areas targeted by this assay, and inadequate number of viral copies(<138 copies/mL). A negative result must be combined with clinical observations, patient history, and epidemiological information. The expected result is Negative.  Fact Sheet for Patients:  BloggerCourse.com  Fact Sheet for Healthcare Providers:  SeriousBroker.it  This test is no t yet approved or cleared by the [place] FDA and  has been authorized for detection and/or diagnosis of SARS-CoV-2 by FDA under an Emergency Use Authorization (EUA). This EUA will remain  in effect (meaning this test can be used) for the duration of the COVID-19 declaration under Section 564(b)(1) of the Act, 21 U.S.C.section 360bbb-3(b)(1), unless the authorization is terminated  or revoked sooner.       Influenza A by PCR NEGATIVE NEGATIVE Final   Influenza B by PCR NEGATIVE NEGATIVE Final    Comment: (NOTE) The Xpert Xpress SARS-CoV-2/FLU/RSV plus assay is intended as an aid in the diagnosis of influenza from Nasopharyngeal swab specimens and should not be used as a sole basis for treatment. Nasal washings and aspirates are unacceptable for Xpert Xpress SARS-CoV-2/FLU/RSV testing.  Fact Sheet for Patients: BloggerCourse.com  Fact  Sheet for Healthcare Providers: SeriousBroker.it  This test is not yet approved or cleared by the [place] and has been authorized for detection and/or diagnosis of SARS-CoV-2 by FDA under an Emergency Use Authorization (EUA). This EUA will remain in effect (meaning this test can be used) for the duration of the COVID-19 declaration under Section 564(b)(1) of the Act, 21 U.S.C. section 360bbb-3(b)(1), unless the authorization is terminated or revoked.     Resp Syncytial Virus by PCR NEGATIVE NEGATIVE Final    Comment: (NOTE) Fact Sheet for  Patients: BloggerCourse.com  Fact Sheet for Healthcare Providers: SeriousBroker.it  This test is not yet approved or cleared by the [place] FDA and has been authorized for detection and/or diagnosis of SARS-CoV-2 by FDA under an Emergency Use Authorization (EUA). This EUA will remain in effect (meaning this test can be used) for the duration of the COVID-19 declaration under Section 564(b)(1) of the Act, 21 U.S.C. section 360bbb-3(b)(1), unless the authorization is terminated or revoked.  Performed at [facility], [address] [place], [place] [address]   Respiratory (~20 pathogens) panel by PCR     Status: None   Collection Time: 01/18/23  4:46 PM   Specimen: Nasopharyngeal Swab; Respiratory  Result Value Ref Range Status   Adenovirus NOT DETECTED NOT DETECTED Final   Coronavirus 229E NOT DETECTED NOT DETECTED Final    Comment: (NOTE) The Coronavirus on the Respiratory Panel, DOES NOT test for the novel  Coronavirus (2019 nCoV)    Coronavirus HKU1 NOT DETECTED NOT DETECTED Final   Coronavirus NL63 NOT DETECTED NOT DETECTED Final   Coronavirus OC43 NOT DETECTED NOT DETECTED Final   Metapneumovirus NOT DETECTED NOT DETECTED Final   Rhinovirus / Enterovirus NOT DETECTED NOT DETECTED Final   Influenza A NOT DETECTED NOT DETECTED Final   Influenza B NOT DETECTED NOT DETECTED Final   Parainfluenza Virus 1 NOT DETECTED NOT DETECTED Final   Parainfluenza Virus 2 NOT DETECTED NOT DETECTED Final   Parainfluenza Virus 3 NOT DETECTED NOT DETECTED Final   Parainfluenza Virus 4 NOT DETECTED NOT DETECTED Final   Respiratory Syncytial Virus NOT DETECTED NOT DETECTED Final   Bordetella pertussis NOT DETECTED NOT DETECTED Final   Bordetella Parapertussis NOT DETECTED NOT DETECTED Final   Chlamydophila pneumoniae NOT DETECTED NOT DETECTED Final   Mycoplasma pneumoniae NOT DETECTED NOT DETECTED Final    Comment:  Performed at [facility], [address], [place], [place] [address]      Radiology Studies: No results found.    LOS: 1 day    Jacquelin Hawking, MD [facility] 01/21/2023, 10:06 AM   If 7PM-7AM, please contact night-coverage [email or web address]

## 2023-01-21 NOTE — Progress Notes (Signed)
NAME:  Richard Parrish, MRN:  [id number], DOB:  08/11/1950, LOS: 1 ADMISSION DATE:  01/18/2023, CONSULTATION DATE:  01/19/23 REFERRING MD:  Jacquelin Hawking, MD CHIEF COMPLAINT:  ILD exacerbation   History of Present Illness:  72 year old male with chronic hypoxemic respiratory failure on 5L secondary to IPF who presented for shortness of breath. He is followed by Dr. Vassie Loll at [facility] who advised him to be evaluated for IPF exacerbation. He reports for the last week he has had progressive shortness of breath with activity. 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 Has not had to increase his home O2 levels however in the ED noted to have SpO2 79% initially. On 5L will decrease to 92% with severe dyspnea. Has baseline productive cough that is unchanged. Denies fevers, chills, wheezing. Denies chest pain or lower extremity edema.  RVP neg. Procal neg. LA 1.0. CBC and CMET unremarkable without evidence of leukocytosis. WBC 7.6 with lymphopenia so possible viral. Today K 5.9  Pertinent  Medical History  RUL adenocarcinoma s/p bronchoscopy  and resection 02/2021, hx recurrent pneumothorax, submassive pulmonary embolism, LLE DVT (04/2021), idiopathic pulmonary fibrosis, HLD, chronic pain, depression  Significant Hospital Events: Including procedures, antibiotic start and stop dates in addition to other pertinent events   11/20 Admitted to [facility]  Interim History / Subjective:  Continues to require up to 10L with PT team today  Objective   Blood pressure 132/71, pulse 79, temperature 97.7 F (36.5 C), temperature source Oral, resp. rate 20, height 6' (1.829 m), weight 80.9 kg, SpO2 98%.        Intake/Output Summary (Last 24 hours) at 01/21/2023 1259 Last data filed at 01/21/2023 0849 Gross per 24 hour  Intake 799.85 ml  Output --  Net 799.85 ml   Filed Weights   01/18/23 1233 01/19/23 0500 01/21/23 0500   Weight: 84.8 kg 83 kg 80.9 kg   Physical Exam: General: Well-appearing, no acute distress HENT: Walker, AT Eyes: EOMI, no scleral icterus Respiratory: Inspiratory crackles at bases Cardiovascular: RRR, -M/R/G, no JVD Extremities:-Edema,-tenderness Neuro: AAO x4, CNII-XII grossly intact Psych: Normal mood, normal affect  CBC and BMET reviewed and acceptable  Resolved Hospital Problem list   N/A  Assessment & Plan:  ILD/IPF exacerbation Chronic hypoxemic respiratory failure -CT Chest 11/21 with increased interstitial and septal thickening bilaterally with interval involvement left lung, increased at the bases on my interpretation. May be progression of ILD but reasonable to treat with steroids to minimize inflammation especially with the findings seen on the left side Continue solumedrol 80 mg BID. Will need taper when closer to discharge Start scheduled Pulmicort and Brovana to support respiratory status Complete 5 days of CAP coverage Continue to wean O2 for goal >88%. Currently requiring 10L on exertion Will need ambulatory O2 prior to discharge to determine home O2 needs Resume home pirfenidone at discharge  Hx pulmonary embolism --Continue home eliquis  Best Practice (right click and "Reselect all SmartList Selections" daily)   Diet/type: Regular consistency (see orders) DVT prophylaxis: DOAC GI prophylaxis: PPI Lines: N/A Foley:  N/A Code Status:  full code Last date of multidisciplinary goals of care discussion [ ]  per primary  Critical care time: N/A    Updated patient and wife at bedside Care Time: 21 min  Mechele Collin, M.D. [facility]Critical Care Medicine 01/21/2023 12:59 PM   See Amion for personal pager For hours between 7 PM  to 7 AM, please call Elink for urgent questions

## 2023-01-21 NOTE — Progress Notes (Signed)
Mobility Specialist - Progress Note  (6L [place]) Pre-mobility: 58 bpm HR, 97% SpO2 During mobility: 102 bpm HR, 85% SpO2 Post-mobility: 78 bpm HR, 90% SPO2  (10L [place]) Pre-mobility: 72 bpm HR, 97% SpO2 During mobility: 94 bpm HR, 87% SpO2 Post-mobility: 78 bpm HR, 92% SPO2   01/21/23 1137  Mobility  Activity Ambulated independently in hallway  Level of Assistance Independent after set-up  Assistive Device None  Distance Ambulated (ft) 400 ft  Range of Motion/Exercises Active  Activity Response Tolerated well  Mobility Referral Yes  $Mobility charge 1 Mobility  Mobility Specialist Start Time (ACUTE ONLY) 1115  Mobility Specialist Stop Time (ACUTE ONLY) 1137  Mobility Specialist Time Calculation (min) (ACUTE ONLY) 22 min   Pt was found in bed and agreeable to ambulate. RN Hospital doctor to ambulate pt on 6L [place] and 10L [place]. Pt stated feeling well today and had no complaints during ambulation. Ambulated ~250ft and had x1 standing rest break for ~67min due to SPO2 reading of 85% on 6L [place]. After ~42min increased SPO2 >90%. Returned to room and had a seated rest break for ~37min. Afterwards pt ambulated on 10L [place]. Had x1 brief standing reat break due to SPO2 reading of 87% but able to increase >90% in <30sec. Pt returned to sit EOB with all needs met. Call bell in reach and wife in room.  Billey Chang Mobility Specialist

## 2023-01-22 DIAGNOSIS — J84112 Idiopathic pulmonary fibrosis: Secondary | ICD-10-CM | POA: Diagnosis not present

## 2023-01-22 DIAGNOSIS — J9621 Acute and chronic respiratory failure with hypoxia: Secondary | ICD-10-CM | POA: Diagnosis not present

## 2023-01-22 LAB — GLUCOSE, CAPILLARY: Glucose-Capillary: 121 mg/dL — ABNORMAL HIGH (ref 70–99)

## 2023-01-22 NOTE — Progress Notes (Signed)
Gave report to oncoming RN, Bjorn Loser.

## 2023-01-22 NOTE — Progress Notes (Signed)
Mobility Specialist - Progress Note   01/22/23 1318  Oxygen Therapy  SpO2 93 %  O2 Device Nasal Cannula  O2 Flow Rate (L/min) 6 L/min  Patient Activity (if Appropriate) Ambulating  Mobility  Activity Ambulated independently in hallway  Level of Assistance Independent  Assistive Device None  Distance Ambulated (ft) 245 ft  Activity Response Tolerated well  Mobility Referral Yes  $Mobility charge 1 Mobility  Mobility Specialist Start Time (ACUTE ONLY) 1302  Mobility Specialist Stop Time (ACUTE ONLY) 1315  Mobility Specialist Time Calculation (min) (ACUTE ONLY) 13 min   Pt received in bed and agreeable to mobility. No complaints during session. Pt to bed after session with all needs met.    Pre-mobility: 81 HR, 95% SpO2 (6L [place]) During mobility: 92 HR, 93% SpO2 (6L [place]) Post-mobility: 85 HR, 95% SPO2 (6L [place])  Chief Technology Officer

## 2023-01-22 NOTE — Progress Notes (Signed)
PROGRESS NOTE    Richard Parrish  RUE:[id number] DOB: 07-29-1950 DOA: 01/18/2023 PCP: [name], MD   Brief Narrative: Richard Parrish is a 72 y.o. 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  Patient presented secondary to shortness of breath and was found to have evidence of interstitial pulmonary fibrosis exacerbation with possible pneumonia and complicated by acute on chronic hypoxic respiratory failure. Pulmonology consulted. Patient started on high-dose steroids and antibiotics.   Assessment/Plan:  Interstitial pulmonary fibrosis with exacerbation Pulmonology consulted. Patient started on solu-medrol IV. -Pulmonology recommendations: Continue IV solu-medrol BID, Pulmicort and Brovana -Continue pirfenidone  Acute on chronic respiratory failure Patient is on chronic 5 L/min with pulse dose oxygen with ambulation. Currently requiring up to 10 L/min of oxygen supplementation with ambulation. -Continue to wean to home oxygen as able -Treat IPF exacerbation +/- pneumonia  Possible community acquired pneumonia Patient started empirically on ceftriaxone and azithromycin. RVP, COVID-19, influenza and RSV negative. Blood cultures with no growth. -Continue Ceftriaxone and azithromycin x5 day course  Primary hypertension Coreg held on admission secondary to low blood pressures. -Continue Coreg  History of PE/DVT -Continue Eliquis  Chronic pain -Continue Norco  Hyperlipidemia -Continue Lipitor  Depression Anxiety -Continue Paxil and Xanax prn  BPH -Continue Flomax  GERD -Continue Protonix   DVT prophylaxis: Eliquis Code Status:   Code Status: Full Code Family Communication: None at bedside Disposition Plan: Discharge home pending ability to ambulate with less oxygen. Likely 2-4 days   Consultants:   PCCM  Procedures:  None  Antimicrobials: Ceftriaxone Azithromycin    Subjective: No issues. Breathing well.  Objective: BP (!) 144/91 (BP Location: Left Arm)   Pulse 69   Temp 97.7 F (36.5 C) (Oral)   Resp 18   Ht 6' (1.829 m)   Wt 80.9 kg   SpO2 100%   BMI 24.19 kg/m   Examination:  General exam: Appears calm and comfortable Respiratory system: Clear to auscultation. Respiratory effort normal. Cardiovascular system: S1 & S2 heard, RRR. Gastrointestinal system: Abdomen is nondistended, soft and nontender. Normal bowel sounds heard. Central nervous system: Alert and oriented. No focal neurological deficits. Musculoskeletal: No edema. No calf tenderness Psychiatry: Judgement and insight appear normal. Mood & affect appropriate.    Data Reviewed: I have personally reviewed following labs and imaging studies   Last CBC Lab Results  Component Value Date   WBC 10.2 01/21/2023   HGB 12.9 (L) 01/21/2023   HCT 39.3 01/21/2023   MCV 97.8 01/21/2023   MCH 32.1 01/21/2023   RDW 11.6 01/21/2023   PLT 540 (H) 01/21/2023     Last metabolic panel Lab Results  Component Value Date   GLUCOSE 180 (H) 01/21/2023   NA 137 01/21/2023   K 4.0 01/21/2023   CL 104 01/21/2023   CO2 24 01/21/2023   BUN 21 01/21/2023   CREATININE 0.70 01/21/2023   GFRNONAA >60 01/21/2023   CALCIUM 9.1 01/21/2023   PROT 6.7 01/19/2023   ALBUMIN 3.1 (L) 01/19/2023   LABGLOB 1.9 02/15/2019   AGRATIO 2.4 (H) 02/15/2019   BILITOT 0.7 01/19/2023   ALKPHOS 65 01/19/2023   AST 27 01/19/2023   ALT 17 01/19/2023   ANIONGAP 9 01/21/2023     Creatinine Clearance: Estimated Creatinine Clearance: 90.3 mL/min (by C-G formula based on SCr of 0.7 mg/dL).  Recent Results (from the past 240 hour(s))  Blood  culture (routine x 2)     Status: None (Preliminary result)   Collection Time: 01/18/23  1:00 PM   Specimen: BLOOD RIGHT ARM  Result Value Ref Range Status   Specimen Description   Final     BLOOD RIGHT ARM Performed at [facility], [address] [place], [place] [address]    Special Requests   Final    BOTTLES DRAWN AEROBIC AND ANAEROBIC Blood Culture adequate volume Performed at [facility], [address] [place], [place] [address]    Culture   Final    NO GROWTH 3 DAYS Performed at [facility], [address] [place], [place] [address]    Report Status PENDING  Incomplete  Blood culture (routine x 2)     Status: None (Preliminary result)   Collection Time: 01/18/23  1:10 PM   Specimen: BLOOD RIGHT WRIST  Result Value Ref Range Status   Specimen Description   Final    BLOOD RIGHT WRIST Performed at [facility], [address] [place], [place] [address]    Special Requests   Final    BOTTLES DRAWN AEROBIC AND ANAEROBIC Blood Culture adequate volume Performed at [facility], [address] [place], [place] [address]    Culture   Final    NO GROWTH 3 DAYS Performed at [facility], [address], [place], [place] [address]    Report Status PENDING  Incomplete  Resp panel by RT-PCR (RSV, Flu A&B, Covid) Anterior Nasal Swab     Status: None   Collection Time: 01/18/23  1:35 PM   Specimen: Anterior Nasal Swab  Result Value Ref Range Status   SARS Coronavirus 2 by RT PCR NEGATIVE NEGATIVE Final    Comment: (NOTE) SARS-CoV-2 target nucleic acids are NOT DETECTED.  The SARS-CoV-2 RNA is generally detectable in upper respiratory specimens during the acute phase of infection. The lowest concentration of SARS-CoV-2 viral copies this assay can detect is 138 copies/mL. A negative result does not preclude SARS-Cov-2 infection and should not be used as the sole basis for treatment or other patient management decisions. A negative result may occur with  improper specimen collection/handling, submission of specimen other than nasopharyngeal swab, presence of viral mutation(s) within the areas  targeted by this assay, and inadequate number of viral copies(<138 copies/mL). A negative result must be combined with clinical observations, patient history, and epidemiological information. The expected result is Negative.  Fact Sheet for Patients:  BloggerCourse.com  Fact Sheet for Healthcare Providers:  SeriousBroker.it  This test is no t yet approved or cleared by the [place] FDA and  has been authorized for detection and/or diagnosis of SARS-CoV-2 by FDA under an Emergency Use Authorization (EUA). This EUA will remain  in effect (meaning this test can be used) for the duration of the COVID-19 declaration under Section 564(b)(1) of the Act, 21 U.S.C.section 360bbb-3(b)(1), unless the authorization is terminated  or revoked sooner.       Influenza A by PCR NEGATIVE NEGATIVE Final   Influenza B by PCR NEGATIVE NEGATIVE Final    Comment: (NOTE) The Xpert Xpress SARS-CoV-2/FLU/RSV plus assay is intended as an aid in the diagnosis of influenza from Nasopharyngeal swab specimens and should not be used as a sole basis for treatment. Nasal washings and aspirates are unacceptable for Xpert Xpress SARS-CoV-2/FLU/RSV testing.  Fact Sheet for Patients: BloggerCourse.com  Fact Sheet for Healthcare Providers: SeriousBroker.it  This test is not yet approved  or cleared by the [place] and has been authorized for detection and/or diagnosis of SARS-CoV-2 by FDA under an Emergency Use Authorization (EUA). This EUA will remain in effect (meaning this test can be used) for the duration of the COVID-19 declaration under Section 564(b)(1) of the Act, 21 U.S.C. section 360bbb-3(b)(1), unless the authorization is terminated or revoked.     Resp Syncytial Virus by PCR NEGATIVE NEGATIVE Final    Comment: (NOTE) Fact Sheet for  Patients: BloggerCourse.com  Fact Sheet for Healthcare Providers: SeriousBroker.it  This test is not yet approved or cleared by the [place] FDA and has been authorized for detection and/or diagnosis of SARS-CoV-2 by FDA under an Emergency Use Authorization (EUA). This EUA will remain in effect (meaning this test can be used) for the duration of the COVID-19 declaration under Section 564(b)(1) of the Act, 21 U.S.C. section 360bbb-3(b)(1), unless the authorization is terminated or revoked.  Performed at [facility], [address] [place], [place] [address]   Respiratory (~20 pathogens) panel by PCR     Status: None   Collection Time: 01/18/23  4:46 PM   Specimen: Nasopharyngeal Swab; Respiratory  Result Value Ref Range Status   Adenovirus NOT DETECTED NOT DETECTED Final   Coronavirus 229E NOT DETECTED NOT DETECTED Final    Comment: (NOTE) The Coronavirus on the Respiratory Panel, DOES NOT test for the novel  Coronavirus (2019 nCoV)    Coronavirus HKU1 NOT DETECTED NOT DETECTED Final   Coronavirus NL63 NOT DETECTED NOT DETECTED Final   Coronavirus OC43 NOT DETECTED NOT DETECTED Final   Metapneumovirus NOT DETECTED NOT DETECTED Final   Rhinovirus / Enterovirus NOT DETECTED NOT DETECTED Final   Influenza A NOT DETECTED NOT DETECTED Final   Influenza B NOT DETECTED NOT DETECTED Final   Parainfluenza Virus 1 NOT DETECTED NOT DETECTED Final   Parainfluenza Virus 2 NOT DETECTED NOT DETECTED Final   Parainfluenza Virus 3 NOT DETECTED NOT DETECTED Final   Parainfluenza Virus 4 NOT DETECTED NOT DETECTED Final   Respiratory Syncytial Virus NOT DETECTED NOT DETECTED Final   Bordetella pertussis NOT DETECTED NOT DETECTED Final   Bordetella Parapertussis NOT DETECTED NOT DETECTED Final   Chlamydophila pneumoniae NOT DETECTED NOT DETECTED Final   Mycoplasma pneumoniae NOT DETECTED NOT DETECTED Final    Comment:  Performed at [facility], [address] [place], [place] [address]      Radiology Studies: No results found.    LOS: 2 days    Jacquelin Hawking, MD [facility] 01/22/2023, 7:43 AM   If 7PM-7AM, please contact night-coverage [email or web address]

## 2023-01-22 NOTE — Plan of Care (Signed)
  Problem: Education: Goal: Knowledge of General Education information will improve Description Including pain rating scale, medication(s)/side effects and non-pharmacologic comfort measures Outcome: Progressing   Problem: Health Behavior/Discharge Planning: Goal: Ability to manage health-related needs will improve Outcome: Progressing   

## 2023-01-22 NOTE — Plan of Care (Signed)

## 2023-01-23 ENCOUNTER — Telehealth (HOSPITAL_COMMUNITY): Payer: Self-pay | Admitting: Pharmacy Technician

## 2023-01-23 ENCOUNTER — Other Ambulatory Visit (HOSPITAL_COMMUNITY): Payer: Self-pay

## 2023-01-23 DIAGNOSIS — J9621 Acute and chronic respiratory failure with hypoxia: Secondary | ICD-10-CM | POA: Diagnosis not present

## 2023-01-23 LAB — CULTURE, BLOOD (ROUTINE X 2)
Culture: NO GROWTH
Culture: NO GROWTH
Special Requests: ADEQUATE
Special Requests: ADEQUATE

## 2023-01-23 LAB — GLUCOSE, CAPILLARY: Glucose-Capillary: 133 mg/dL — ABNORMAL HIGH (ref 70–99)

## 2023-01-23 MED ORDER — PREDNISONE 10 MG PO TABS
ORAL_TABLET | ORAL | 0 refills | Status: DC
  Filled 2023-01-23: qty 141, 41d supply, fill #0

## 2023-01-23 MED ORDER — PREDNISONE 50 MG PO TABS: 60.0000 mg | ORAL_TABLET | Freq: Every day | ORAL | Status: DC

## 2023-01-23 MED ORDER — TAMSULOSIN HCL 0.4 MG PO CAPS
0.4000 mg | ORAL_CAPSULE | Freq: Two times a day (BID) | ORAL | Status: DC
  Administered 2023-01-23: 0.4 mg via ORAL
  Filled 2023-01-23: qty 1

## 2023-01-23 MED ORDER — ARFORMOTEROL TARTRATE 15 MCG/2ML IN NEBU
15.0000 ug | INHALATION_SOLUTION | Freq: Two times a day (BID) | RESPIRATORY_TRACT | 1 refills | Status: DC
  Filled 2023-01-23: qty 120, 30d supply, fill #0

## 2023-01-23 MED ORDER — BUDESONIDE 0.5 MG/2ML IN SUSP
0.5000 mg | Freq: Every day | RESPIRATORY_TRACT | 1 refills | Status: DC
  Filled 2023-01-23: qty 60, 30d supply, fill #0

## 2023-01-23 NOTE — Progress Notes (Addendum)
Mobility Specialist - Progress Note  (6L HFNC) Pre-mobility: 95 bpm HR, 97% SpO2 During mobility: 103 bpm HR, 92% SpO2 Post-mobility: 90 bpm HR, 94% SPO2   01/23/23 1027  Mobility  Activity Ambulated independently in hallway  Level of Assistance Independent  Assistive Device None  Distance Ambulated (ft) 300 ft  Range of Motion/Exercises Active  Activity Response Tolerated fair  Mobility Referral Yes  $Mobility charge 1 Mobility  Mobility Specialist Start Time (ACUTE ONLY) 1010  Mobility Specialist Stop Time (ACUTE ONLY) 1027  Mobility Specialist Time Calculation (min) (ACUTE ONLY) 17 min   Pt was found in bed and agreeable to ambulate. Stated feeling a little winded with session. At EOS returned to bed with all needs met. Call bell in reach and wife in room. RN notified.  Billey Chang Mobility Specialist

## 2023-01-23 NOTE — Progress Notes (Signed)
SATURATION QUALIFICATIONS: (This note is used to comply with regulatory documentation for home oxygen)  Patient Saturations on 6 Liters of oxygen at Rest = 97%  Patient Saturations on 6 Liters of oxygen while Ambulating = 90-92%  Please briefly explain why patient needs home oxygen:  Patient tolerating ambulation w/ baseline 5-6L of oxygen via HFNC while ambulating.

## 2023-01-23 NOTE — Care Management Important Message (Signed)
Important Message  Patient Details IM Letter given. Name: Richard Parrish MRN: [id number] Date of Birth: September 15, 1950   Important Message Given:  Yes - Medicare IM     Caren Macadam 01/23/2023, 2:22 PM

## 2023-01-23 NOTE — Discharge Instructions (Signed)
Richard Parrish,  You were in the hospital with trouble breathing related to your IPF. This was managed with steroids and you eventually had some improvement back towards your baseline. Please continue your steroids and inhalation treatments as prescribed.

## 2023-01-23 NOTE — Progress Notes (Signed)
AVS and discharge instructions reviewed w/ patient and wife at the bedside. Both parties verbalized understanding. Medications from pharmacy and nebulizer for home use delivered to the bedside as well.

## 2023-01-23 NOTE — TOC Progression Note (Signed)
Transition of Care [facility]) - Progression Note    Patient Details  Name: Richard Parrish MRN: [id number] Date of Birth: 10-31-1950  Transition of Care [facility]) CM/SW Contact  Howell Rucks, RN Phone Number: 01/23/2023, 1:06 PM  Clinical Narrative: [facility] consult for Home Nebulizer Machine. Referral to [employer], rep-Jermaine, to deliver to bedside, added to AVS.    Expected Discharge Plan: Home/Self Care Barriers to Discharge: Continued Medical Work up  Expected Discharge Plan and Services       Living arrangements for the past 2 months: Single Family Home Expected Discharge Date: 01/23/23                                     Social Determinants of Health (SDOH) Interventions SDOH Screenings   Food Insecurity: No Food Insecurity (01/18/2023)  Housing: Low Risk  (01/18/2023)  Transportation Needs: No Transportation Needs (01/18/2023)  Utilities: Not At Risk (01/18/2023)  Depression (PHQ2-9): High Risk (11/22/2021)  Tobacco Use: Medium Risk (01/18/2023)    Readmission Risk Interventions    01/20/2023   11:38 AM 05/22/2021    9:37 AM 04/20/2021    8:25 AM  Readmission Risk Prevention Plan  Post Dischage Appt Complete    Medication Screening Complete    Transportation Screening Complete Complete Complete  PCP or Specialist Appt within 5-7 Days   Complete  Home Care Screening   Complete  Medication Review (RN CM)   Complete  Medication Review (RN Care Manager)  Complete   PCP or Specialist appointment within 3-5 days of discharge  Complete   HRI or Home Care Consult  Complete   SW Recovery Care/Counseling Consult  Complete   Palliative Care Screening  Not Applicable   Skilled Nursing Facility  Complete

## 2023-01-23 NOTE — Telephone Encounter (Signed)
Pharmacy Patient Advocate Encounter   Received notification from Patient Pharmacy that prior authorization for Arformoterol Tartrate 15MCG/2ML nebulizer solution is required/requested.   Insurance verification completed.   The patient is insured through [facility] ADVANTAGE/RX ADVANCE .   Per test claim: PA required; PA submitted to above mentioned insurance via CoverMyMeds Key/confirmation #/EOC [id number] Status is pending

## 2023-01-23 NOTE — Discharge Summary (Signed)
Physician Discharge Summary   Patient: Richard Parrish MRN: [id number] DOB: Mar 23, 1950  Admit date:     01/18/2023  Discharge date: 01/23/23  Discharge Physician: Jacquelin Hawking, MD   PCP: [name], MD   Recommendations at discharge:  PCP visit for hospital follow-up Pulmonology visit for hospital follow-up Prolonged prednisone taper  Discharge Diagnoses: Principal Problem:   Acute on chronic respiratory failure with hypoxia [facility]) Active Problems:   Pulmonary embolism (HCC)   Acute exacerbation of idiopathic pulmonary fibrosis (HCC)   IPF (idiopathic pulmonary fibrosis) (HCC)   S/P Robotic Assisted Right Video Assisted Thoracoscopy with Right Upper Lobectomy, Right Lower Lobe wedge resection   Malignant neoplasm of upper lobe, right bronchus or lung (HCC)   Acute on chronic hypoxic respiratory failure (HCC)   Acute on chronic respiratory failure (HCC)  Resolved Problems:   * No resolved hospital problems. [facility] Course:  Richard Parrish is a 73 y.o. 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  Patient presented secondary to shortness of breath and was found to have evidence of interstitial pulmonary fibrosis exacerbation with possible pneumonia and complicated by acute on chronic hypoxic respiratory failure. Pulmonology consulted. Patient started on high-dose steroids and antibiotics with improvement in symptoms. Patient discharged on prednisone taper and pulmonology follow-up.  Assessment and Plan:  Interstitial pulmonary fibrosis with exacerbation Pulmonology consulted. Patient started on solu-medrol IV in addition to Pulmicort and Brovana with slow improvement in symptoms. Patient discharged on prednisone taper, Brovana, Pulmicort and recommended to continue home pirfenidone. Patient to follow-up with  pulmonology as an outpatient.   Acute on chronic respiratory failure Patient is on chronic 5 L/min with pulse dose oxygen with ambulation. Requiring up to 10 L/min of oxygen supplementation with ambulation which was weaned down to home 5-6 L/min prior to discharge with above treatment.   Possible community acquired pneumonia Patient started empirically on ceftriaxone and azithromycin. RVP, COVID-19, influenza and RSV negative. Blood cultures with no growth. Patient completed a 5-day course of antibiotics.   Primary hypertension Coreg held on admission secondary to low blood pressures. Continue Coreg.   History of PE/DVT Continue Eliquis   Chronic pain Continue Norco   Hyperlipidemia Continue Lipitor   Depression Anxiety Continue Paxil and Xanax prn   BPH Continue Flomax   GERD Continue Protonix  Consultants: Pulmonology Procedures performed: None  Disposition: Home Diet recommendation: Regular diet   DISCHARGE MEDICATION: Allergies as of 01/23/2023       Reactions   Ambien [zolpidem Tartrate] Other (See Comments)   Bad dreams/Vivid Dreams   Metoprolol Rash        Medication List     STOP taking these medications    budesonide 3 MG 24 hr capsule Commonly known as: ENTOCORT EC       TAKE these medications    acetaminophen 500 MG tablet Commonly known as: TYLENOL Take 1 tablet (500 mg total) by mouth every 6 (six) hours as needed for mild pain.   ALPRAZolam 0.5 MG tablet Commonly known as: XANAX Take 0.5-1 tablets (0.25-0.5 mg total) by mouth 3 (three) times daily as needed for anxiety or sleep. What changed:  how much to take when to take this reasons to take this   antiseptic oral rinse Liqd 15 mLs by Mouth Rinse route daily as needed for dry mouth.   arformoterol 15 MCG/2ML Nebu Commonly known  as: BROVANA Take 2 mLs (15 mcg total) by nebulization 2 (two) times daily.   atorvastatin 80 MG tablet Commonly known as: LIPITOR Take 1 tablet  (80 mg total) by mouth daily.   budesonide 0.5 MG/2ML nebulizer solution Commonly known as: PULMICORT Take 2 mLs (0.5 mg total) by nebulization daily.   carvedilol 6.25 MG tablet Commonly known as: COREG Take 1 tablet (6.25 mg total) by mouth 2 (two) times daily.   cholecalciferol 25 MCG (1000 UNIT) tablet Commonly known as: VITAMIN D3 Take 1,000 Units by mouth 3 (three) times a week.   diphenhydrAMINE 25 mg capsule Commonly known as: BENADRYL Take 50 mg by mouth 2 (two) times daily.   Eliquis 5 MG Tabs tablet Generic drug: apixaban TAKE 1 TABLET BY MOUTH TWICE A DAY   guaiFENesin 600 MG 12 hr tablet Commonly known as: MUCINEX Take 1 tablet (600 mg total) by mouth 2 (two) times daily as needed. What changed: when to take this   HYDROcodone-acetaminophen 5-325 MG tablet Commonly known as: NORCO/VICODIN Take 1 tablet by mouth 5 (five) times daily.   hyoscyamine 0.125 MG tablet Commonly known as: LEVSIN Take 0.125 mg by mouth every 4 (four) hours as needed for cramping.   nitroGLYCERIN 0.4 MG SL tablet Commonly known as: NITROSTAT Place 1 tablet (0.4 mg total) under the tongue every 5 (five) minutes as needed for chest pain.   OXYGEN Inhale 5 L/min into the lungs continuous.   pantoprazole 40 MG tablet Commonly known as: PROTONIX TAKE 1 TABLET BY MOUTH EVERY DAY BEFORE BREAKFAST What changed: See the new instructions.   PARoxetine 20 MG tablet Commonly known as: PAXIL Take 20 mg by mouth daily.   Pirfenidone 801 MG Tabs Take 1 tablet (801 mg total) by mouth 3 (three) times daily with meals. ([email or web address])   predniSONE 10 MG tablet Commonly known as: DELTASONE Take 6 tablets (60 mg total) by mouth daily with breakfast for 6 days, THEN 5 tablets (50 mg total) daily with breakfast for 7 days, THEN 4 tablets (40 mg total) daily with breakfast for 7 days, THEN 3 tablets (30 mg total) daily with breakfast for 7 days, THEN 2 tablets (20 mg total) daily with  breakfast for 7 days, THEN 1 tablet (10 mg total) daily with breakfast for 7 days. Start taking on: January 24, 2023   sodium chloride 0.65 % Soln nasal spray Commonly known as: OCEAN Place 1 spray into both nostrils daily as needed for congestion.   tamsulosin 0.4 MG Caps capsule Commonly known as: FLOMAX Take 0.4 mg by mouth in the morning and at bedtime.   triamcinolone cream 0.1 % Commonly known as: KENALOG Apply 1 Application topically 2 (two) times daily as needed (for itching).   Tylenol PM Extra Strength 500-25 MG Tabs tablet Generic drug: diphenhydramine-acetaminophen Take 1 tablet by mouth at bedtime.        Follow-up Information     [name], MD. Schedule an appointment as soon as possible for a visit in 1 week(s).   Specialty: Internal Medicine Why: For hospital follow-up Contact information: [address] [place] [address] [phone]         Chilton Greathouse, MD. Schedule an appointment as soon as possible for a visit.   Specialty: Pulmonary Disease Why: For hospital follow-up Contact information: [address] [place] [address] [phone]                Discharge Exam: BP  138/86 (BP Location: Right Arm)   Pulse 69   Temp 97.7 F (36.5 C) (Oral)   Resp 20   Ht 6' (1.829 m)   Wt 80 kg   SpO2 100%   BMI 23.92 kg/m   General exam: Appears calm and comfortable Respiratory system: Clear to auscultation. Respiratory effort normal. Cardiovascular system: S1 & S2 heard, RRR. Gastrointestinal system: Abdomen is nondistended, soft and nontender.  Normal bowel sounds heard. Central nervous system: Alert and oriented. No focal neurological deficits. Psychiatry: Judgement and insight appear normal. Mood & affect appropriate.   Condition at discharge: stable  The results of significant diagnostics from this hospitalization (including imaging, microbiology, ancillary and laboratory) are listed below for  reference.   Imaging Studies: CT Chest High Resolution  Result Date: 01/19/2023 CLINICAL DATA:  Inpatient. IPF. Acute on chronic hypoxic respiratory failure. History of right upper lobe lung cancer. * Tracking Code: BO * EXAM: CT CHEST WITHOUT CONTRAST TECHNIQUE: Multidetector CT imaging of the chest was performed following the standard protocol without intravenous contrast. High resolution imaging of the lungs, as well as inspiratory and expiratory imaging, was performed. RADIATION DOSE REDUCTION: This exam was performed according to the departmental dose-optimization program which includes automated exposure control, adjustment of the mA and/or kV according to patient size and/or use of iterative reconstruction technique. COMPARISON:  Chest radiograph from earlier today. 09/12/2022 chest CT. FINDINGS: Cardiovascular: Normal heart size. No significant pericardial effusion/thickening. Three-vessel coronary atherosclerosis status post CABG. Atherosclerotic thoracic aorta with dilated 4.0 cm ascending thoracic aorta. Normal caliber pulmonary arteries. Mediastinum/Nodes: No significant thyroid nodules. Unremarkable esophagus. No axillary adenopathy. Mildly enlarged 1.0 cm right subcarinal node (series 2/image 79), stable. No new pathologically enlarged mediastinal nodes. No discrete hilar adenopathy on these noncontrast images. Lungs/Pleura: No pneumothorax. Trace posterior bilateral pleural effusions, similar. Status post right upper lobectomy. New patchy consolidation and ground-glass opacity throughout the posterior left upper lobe and superior segment left lower lobe. Background patchy extensive confluent reticulation and ground-glass opacity throughout the peripheral lungs bilaterally with associated moderate traction bronchiectasis, architectural distortion and volume loss with a clear basilar predominance and with moderate honeycombing. These findings are not substantially changed since 07/12/2021 CT. No  significant lobular air trapping or evidence of tracheobronchomalacia on the expiration sequence. No significant pulmonary nodules. Upper abdomen: Cholecystectomy. Musculoskeletal: No aggressive appearing focal osseous lesions. Moderate thoracic spondylosis. Intact sternotomy wires. IMPRESSION: 1. New patchy consolidation and ground-glass opacity throughout the posterior left upper lobe and superior segment left lower lobe, most compatible with multilobar pneumonia. Suggest attention on follow-up outpatient high-resolution chest CT in 3-6 months. 2. Background extensive basilar predominant fibrotic interstitial lung disease with moderate honeycombing, not substantially changed since 07/12/2021 CT. Findings are consistent with UIP per consensus guidelines: Diagnosis of Idiopathic Pulmonary Fibrosis: An Official ATS/ERS/JRS/ALAT Clinical Practice Guideline. Am Rosezetta Schlatter Crit Care Med Vol 198, Iss 5, [phone], Oct 29 2016. 3. Trace posterior bilateral pleural effusions, similar. 4. Stable mildly enlarged right subcarinal lymph node, nonspecific, probably reactive. 5.  Aortic Atherosclerosis (ICD10-I70.0). Electronically Signed   By: Delbert Phenix M.D.   On: 01/19/2023 15:00   DG Chest Port 1 View  Result Date: 01/18/2023 CLINICAL DATA:  Shortness of breath. EXAM: PORTABLE CHEST 1 VIEW COMPARISON:  Radiograph 11/25/2022.  CT 09/12/2022 FINDINGS: Prior median sternotomy. Stable heart size and mediastinal contours. Chronic volume loss in right hemithorax. Surgical clips at the right hilum. Diffuse reticular opacities, right greater than left, with progression from prior exam. There may be  a small right pleural effusion. No evidence of confluent airspace disease. No pneumothorax. IMPRESSION: 1. Diffuse reticular opacities, right greater than left, with progression from prior exam. May represent progression of interstitial lung disease versus superimposed infection. 2. Chronic volume loss in the right hemithorax.  Electronically Signed   By: Narda Rutherford M.D.   On: 01/18/2023 16:44    Microbiology: Results for orders placed or performed during the hospital encounter of 01/18/23  Blood culture (routine x 2)     Status: None   Collection Time: 01/18/23  1:00 PM   Specimen: BLOOD RIGHT ARM  Result Value Ref Range Status   Specimen Description   Final    BLOOD RIGHT ARM Performed at [facility], [address], [place], [place] [address]    Special Requests   Final    BOTTLES DRAWN AEROBIC AND ANAEROBIC Blood Culture adequate volume Performed at [facility], [address], [place], [place] [address]    Culture   Final    NO GROWTH 5 DAYS Performed at [facility], [address] [place], [place] [address]    Report Status 01/23/2023 FINAL  Final  Blood culture (routine x 2)     Status: None   Collection Time: 01/18/23  1:10 PM   Specimen: BLOOD RIGHT WRIST  Result Value Ref Range Status   Specimen Description   Final    BLOOD RIGHT WRIST Performed at [facility], [address], [place], [place] [address]    Special Requests   Final    BOTTLES DRAWN AEROBIC AND ANAEROBIC Blood Culture adequate volume Performed at [facility], [address], [place], [place] [address]    Culture   Final    NO GROWTH 5 DAYS Performed at [facility], [address] [place], [place] [address]    Report Status 01/23/2023 FINAL  Final  Resp panel by RT-PCR (RSV, Flu A&B, Covid) Anterior Nasal Swab     Status: None   Collection Time: 01/18/23  1:35 PM   Specimen: Anterior Nasal Swab  Result Value Ref Range Status   SARS Coronavirus 2 by RT PCR NEGATIVE NEGATIVE Final    Comment: (NOTE) SARS-CoV-2 target nucleic acids are NOT DETECTED.  The SARS-CoV-2 RNA is generally detectable in upper respiratory specimens during the acute phase of infection. The lowest concentration of SARS-CoV-2 viral copies this assay can detect is 138  copies/mL. A negative result does not preclude SARS-Cov-2 infection and should not be used as the sole basis for treatment or other patient management decisions. A negative result may occur with  improper specimen collection/handling, submission of specimen other than nasopharyngeal swab, presence of viral mutation(s) within the areas targeted by this assay, and inadequate number of viral copies(<138 copies/mL). A negative result must be combined with clinical observations, patient history, and epidemiological information. The expected result is Negative.  Fact Sheet for Patients:  BloggerCourse.com  Fact Sheet for Healthcare Providers:  SeriousBroker.it  This test is no t yet approved or cleared by the [place] FDA and  has been authorized for detection and/or diagnosis of SARS-CoV-2 by FDA under an Emergency Use Authorization (EUA). This EUA will remain  in effect (meaning this test can be used) for the duration of the COVID-19 declaration under Section 564(b)(1) of the Act, 21 U.S.C.section 360bbb-3(b)(1), unless the authorization is terminated  or revoked sooner.       Influenza A by PCR NEGATIVE NEGATIVE Final  Influenza B by PCR NEGATIVE NEGATIVE Final    Comment: (NOTE) The Xpert Xpress SARS-CoV-2/FLU/RSV plus assay is intended as an aid in the diagnosis of influenza from Nasopharyngeal swab specimens and should not be used as a sole basis for treatment. Nasal washings and aspirates are unacceptable for Xpert Xpress SARS-CoV-2/FLU/RSV testing.  Fact Sheet for Patients: BloggerCourse.com  Fact Sheet for Healthcare Providers: SeriousBroker.it  This test is not yet approved or cleared by the [place] FDA and has been authorized for detection and/or diagnosis of SARS-CoV-2 by FDA under an Emergency Use Authorization (EUA). This EUA will remain in effect (meaning  this test can be used) for the duration of the COVID-19 declaration under Section 564(b)(1) of the Act, 21 U.S.C. section 360bbb-3(b)(1), unless the authorization is terminated or revoked.     Resp Syncytial Virus by PCR NEGATIVE NEGATIVE Final    Comment: (NOTE) Fact Sheet for Patients: BloggerCourse.com  Fact Sheet for Healthcare Providers: SeriousBroker.it  This test is not yet approved or cleared by the [place] FDA and has been authorized for detection and/or diagnosis of SARS-CoV-2 by FDA under an Emergency Use Authorization (EUA). This EUA will remain in effect (meaning this test can be used) for the duration of the COVID-19 declaration under Section 564(b)(1) of the Act, 21 U.S.C. section 360bbb-3(b)(1), unless the authorization is terminated or revoked.  Performed at [facility], [address] [place], [place] [address]   Respiratory (~20 pathogens) panel by PCR     Status: None   Collection Time: 01/18/23  4:46 PM   Specimen: Nasopharyngeal Swab; Respiratory  Result Value Ref Range Status   Adenovirus NOT DETECTED NOT DETECTED Final   Coronavirus 229E NOT DETECTED NOT DETECTED Final    Comment: (NOTE) The Coronavirus on the Respiratory Panel, DOES NOT test for the novel  Coronavirus (2019 nCoV)    Coronavirus HKU1 NOT DETECTED NOT DETECTED Final   Coronavirus NL63 NOT DETECTED NOT DETECTED Final   Coronavirus OC43 NOT DETECTED NOT DETECTED Final   Metapneumovirus NOT DETECTED NOT DETECTED Final   Rhinovirus / Enterovirus NOT DETECTED NOT DETECTED Final   Influenza A NOT DETECTED NOT DETECTED Final   Influenza B NOT DETECTED NOT DETECTED Final   Parainfluenza Virus 1 NOT DETECTED NOT DETECTED Final   Parainfluenza Virus 2 NOT DETECTED NOT DETECTED Final   Parainfluenza Virus 3 NOT DETECTED NOT DETECTED Final   Parainfluenza Virus 4 NOT DETECTED NOT DETECTED Final   Respiratory Syncytial  Virus NOT DETECTED NOT DETECTED Final   Bordetella pertussis NOT DETECTED NOT DETECTED Final   Bordetella Parapertussis NOT DETECTED NOT DETECTED Final   Chlamydophila pneumoniae NOT DETECTED NOT DETECTED Final   Mycoplasma pneumoniae NOT DETECTED NOT DETECTED Final    Comment: Performed at [facility], [address], [place], [place] [address]    Labs: CBC: Recent Labs  Lab 01/18/23 1306 01/19/23 0403 01/21/23 0957  WBC 7.6 5.3 10.2  NEUTROABS 6.0  --   --   HGB 13.3 12.7* 12.9*  HCT 39.2 38.6* 39.3  MCV 95.4 96.5 97.8  PLT 470* 488* 540*   Basic Metabolic Panel: Recent Labs  Lab 01/18/23 1306 01/19/23 0403 01/19/23 1158 01/21/23 0957  NA 137 140  --  137  K 4.0 5.9* 3.5 4.0  CL 101 105  --  104  CO2 28 27  --  24  GLUCOSE 110* 145*  --  180*  BUN 17 18  --  21  CREATININE  0.74 0.71  --  0.70  CALCIUM 9.5 9.4  --  9.1   Liver Function Tests: Recent Labs  Lab 01/18/23 1306 01/19/23 0403  AST 23 27  ALT 12 17  ALKPHOS 60 65  BILITOT 0.7 0.7  PROT 6.7 6.7  ALBUMIN 3.6 3.1*   CBG: Recent Labs  Lab 01/19/23 1207 01/20/23 0759 01/21/23 0730 01/22/23 0739 01/23/23 0809  GLUCAP 136* 126* 125* 121* 133*    Discharge time spent: 35 minutes.  Signed: Jacquelin Hawking, MD [facility] 01/23/2023

## 2023-01-23 NOTE — Progress Notes (Addendum)
NAME:  Richard Parrish, MRN:  [id number], DOB:  May 19, 1950, LOS: 3 ADMISSION DATE:  01/18/2023, CONSULTATION DATE:  01/19/23 REFERRING MD:  Jacquelin Hawking, MD CHIEF COMPLAINT:  ILD exacerbation   History of Present Illness:  72 year old male with chronic hypoxemic respiratory failure on 5L secondary to IPF who presented for shortness of breath. He is followed by Dr. Vassie Loll at [facility] who advised him to be evaluated for IPF exacerbation. He reports for the last week he has had progressive shortness of breath with activity. 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 Has not had to increase his home O2 levels however in the ED noted to have SpO2 79% initially. On 5L will decrease to 92% with severe dyspnea. Has baseline productive cough that is unchanged. Denies fevers, chills, wheezing. Denies chest pain or lower extremity edema.  RVP neg. Procal neg. LA 1.0. CBC and CMET unremarkable without evidence of leukocytosis. WBC 7.6 with lymphopenia so possible viral. Today K 5.9  Pertinent  Medical History  RUL adenocarcinoma s/p bronchoscopy  and resection 02/2021, hx recurrent pneumothorax, submassive pulmonary embolism, LLE DVT (04/2021), idiopathic pulmonary fibrosis, HLD, chronic pain, depression  Significant Hospital Events: Including procedures, antibiotic start and stop dates in addition to other pertinent events   11/20 Admitted to [facility]  Interim History / Subjective:  Improved O2 requirement. Only need 6L O2 with PT yesterday. On 5L at home Plans to work with PT again today  Objective   Blood pressure (!) 145/87, pulse 60, temperature 98.3 F (36.8 C), temperature source Oral, resp. rate 18, height 6' (1.829 m), weight 80 kg, SpO2 96%.        Intake/Output Summary (Last 24 hours) at 01/23/2023 1005 Last data filed at 01/23/2023 0900 Gross per 24 hour  Intake 480 ml  Output 1 ml  Net 479 ml   Filed  Weights   01/21/23 0500 01/22/23 0500 01/23/23 0500  Weight: 80.9 kg 80.9 kg 80 kg   Physical Exam: General: Well-appearing, no acute distress HENT: [place], AT, OP clear, MMM Eyes: EOMI, no scleral icterus Respiratory: Inspiratory crackles at the bases Cardiovascular: RRR, -M/R/G, no JVD Extremities:-Edema,-tenderness Neuro: AAO x4, CNII-XII grossly intact Psych: Normal mood, normal affect  CBC and BMET reviewed. Overall wnl  Resolved Hospital Problem list   N/A  Assessment & Plan:  ILD/IPF exacerbation Chronic hypoxemic respiratory failure -CT Chest 11/21 with increased interstitial and septal thickening bilaterally with interval involvement left lung, increased at the bases on my interpretation. May be progression of ILD but reasonable to treat with steroids to minimize inflammation especially with the findings seen on the left side Transition from solumedrol to prednisone 60 mg  Recommend decreasing by 10 mg every 7 days Started 11/23 scheduled Pulmicort and Brovana to support respiratory status  >Continue Pulmicort at discharge twice a day Complete 5 days of CAP coverage Continue to wean O2 for goal >88%. Currently requiring 6L on exertion Will need ambulatory O2 prior to discharge to determine home O2 needs Resume home pirfenidone at discharge Will arrange pulmonary follow-up (primary Dr. Isaiah Serge)   Hx pulmonary embolism --Continue home eliquis  Best Practice (right click and "Reselect all SmartList Selections" daily)   Diet/type: Regular consistency (see orders) DVT prophylaxis: DOAC GI prophylaxis: PPI Lines: N/A Foley:  N/A Code Status:  full code Last date of multidisciplinary goals of care discussion [ ]  per primary  Critical care  time: N/A    Pulmonary team will sign off. Please contact team for any questions or concerns Care Time: 35 min  Mechele Collin, M.D. [facility] Pulmonary/Critical Care Medicine 01/23/2023 10:12 AM   See Amion for personal pager For  hours between 7 PM to 7 AM, please call Elink for urgent questions

## 2023-01-23 NOTE — Plan of Care (Signed)
Problem: Education: Goal: Knowledge of General Education information will improve Description: Including pain rating scale, medication(s)/side effects and non-pharmacologic comfort measures Outcome: Progressing   Problem: Clinical Measurements: Goal: Respiratory complications will improve Outcome: Progressing   Problem: Activity: Goal: Risk for activity intolerance will decrease Outcome: Progressing   Problem: Pain Management: Goal: General experience of comfort will improve Outcome: Progressing

## 2023-01-24 ENCOUNTER — Other Ambulatory Visit: Payer: Self-pay

## 2023-01-24 ENCOUNTER — Other Ambulatory Visit (HOSPITAL_COMMUNITY): Payer: Self-pay

## 2023-01-24 NOTE — Telephone Encounter (Signed)
Pharmacy Patient Advocate Encounter  Received notification from [facility] [employer] that Prior Authorization for Arformoterol Tartrate 15MCG/2ML nebulizer solution has been APPROVED from 01-23-2023 to 02-28-2024   PA #/Case ID/Reference #: [id number]

## 2023-01-27 DIAGNOSIS — J84112 Idiopathic pulmonary fibrosis: Secondary | ICD-10-CM | POA: Diagnosis not present

## 2023-01-30 ENCOUNTER — Encounter (HOSPITAL_COMMUNITY): Payer: Self-pay

## 2023-01-30 ENCOUNTER — Other Ambulatory Visit (HOSPITAL_COMMUNITY): Payer: Self-pay

## 2023-01-30 DIAGNOSIS — M545 Low back pain, unspecified: Secondary | ICD-10-CM | POA: Diagnosis not present

## 2023-01-30 DIAGNOSIS — M48062 Spinal stenosis, lumbar region with neurogenic claudication: Secondary | ICD-10-CM | POA: Diagnosis not present

## 2023-02-01 ENCOUNTER — Other Ambulatory Visit: Payer: Self-pay

## 2023-02-01 DIAGNOSIS — J189 Pneumonia, unspecified organism: Secondary | ICD-10-CM | POA: Diagnosis not present

## 2023-02-01 DIAGNOSIS — J9611 Chronic respiratory failure with hypoxia: Secondary | ICD-10-CM | POA: Diagnosis not present

## 2023-02-07 ENCOUNTER — Telehealth: Payer: Self-pay | Admitting: Pulmonary Disease

## 2023-02-07 ENCOUNTER — Encounter: Payer: Self-pay | Admitting: Pulmonary Disease

## 2023-02-07 ENCOUNTER — Ambulatory Visit: Payer: PPO | Admitting: Pulmonary Disease

## 2023-02-07 VITALS — BP 132/86 | HR 65 | Temp 97.5°F | Ht 72.0 in | Wt 183.0 lb

## 2023-02-07 DIAGNOSIS — Z5181 Encounter for therapeutic drug level monitoring: Secondary | ICD-10-CM

## 2023-02-07 DIAGNOSIS — J84112 Idiopathic pulmonary fibrosis: Secondary | ICD-10-CM

## 2023-02-07 NOTE — Telephone Encounter (Signed)
Hi, can you please try and get PA approved for Brovana neb sol? Thanks!

## 2023-02-07 NOTE — Patient Instructions (Signed)
VISIT SUMMARY:  You were seen today following a recent hospitalization for an exacerbation of your pulmonary fibrosis. You experienced severe breathing difficulty that led to a collapse and were treated for suspected pneumonia with steroids. You have noted improvement in your strength and oxygen levels since then. We discussed your current medications, oxygen use, and the importance of avoiding lung irritants.  YOUR PLAN:  -INTERSTITIAL LUNG DISEASE (ILD): Interstitial Lung Disease (ILD) refers to a group of lung disorders that cause scarring of lung tissues, leading to breathing difficulties. You were recently hospitalized for a possible exacerbation of ILD with suspected pneumonia and are currently on a tapering dose of prednisone. Please continue the prednisone taper as prescribed, avoid all lung irritants including vaping, and we will consider a repeat lung scan in 6 months.  -OXYGEN DEPENDENCE: Oxygen dependence means you need supplemental oxygen to maintain adequate oxygen levels in your blood. You should continue using home oxygen, especially during activities, as your oxygen levels drop significantly without it.  -MEDICATION ACCESS: We are aware that you are not currently on Brovana due to approval issues. We will address this issue to ensure you have access to the medication if necessary.  -GENERAL HEALTH MAINTENANCE: To maintain your overall health, avoid crowds and potential sources of infection to reduce the risk of exacerbating your ILD. We will follow up in 3 months to monitor your condition and progress.  INSTRUCTIONS:  Please follow up in 3 months for a routine check-up. Continue your prednisone taper as prescribed and use supplemental oxygen during activities. Avoid all lung irritants, including vaping, and stay away from crowds to reduce the risk of infection. We will also address the approval issue for Brovana if necessary.

## 2023-02-07 NOTE — Progress Notes (Signed)
Richard Parrish    [id number]    [birth year]  Primary Care Physician:Polite, Windy Fast, MD  Referring Physician: [name], MD 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  with history of pulmonary fibrosis, GERD, hypertension, hyperlipidemia, pulmonary fibrosis He was diagnosed with unspecified pulmonary fibrosis and was seen in pulmonary clinic in 2015  Developed COVID-19 in March 2021 and reports worsening dyspnea since then.  Complains of chronic dyspnea on exertion, cough.  No fevers or chills  Imaging showed right upper lobe mass and he underwent combined bronchoscopy with Dr. Tonia Brooms and subsequently right upper lobe robotic resection by Dr. Cliffton Asters on 03/03/2021.  Saw Dr. Arbutus Ped from oncology on 03/24/2021 and no further chemotherapy recommended  Postsurgery he had recurrent issues pneumothorax and mildly exacerbation Treated in Jan with levofloxacin and prednisone. He had needle decompression and chest tube placed with Heimlich valve and mini VAC for pneumothorax.  Hospitalized on 2/26 under the care of cardiothoracic surgery.  Discharged on amoxicillin and additional prednisone taper.  Hospitalized in March 2023 with submassive pulmonary embolism, left lower extremity DVT.  Started on Eliquis.  He was also started on prednisone for ILD exacerbation Started Esbriet in May 2023.  So far is tolerating it well without any problem. He has tapered off prednisone in September 2023 Finished pulmonary rehab in September 2023.  Developed photosensitivity rash to Esbriet in 2024.  He took a break from the medication for a week and resumed it in early July 2024 .  Overall he feels that the rash is improving and has consulted with a dermatologist.  Pets:  3 cats Occupation: Used to work in Patent examiner at [employer]: Exposed to [employer] carrier. Smoking history: 30-pack-year smoker.  Quit in 1989.  Currently vapes which helps with his back. Travel history: No significant recent travel history Relevant family history: No family history of lung disease  Interim history: Discussed the use of AI scribe software for clinical note transcription with the patient, who gave verbal consent to proceed.  The patient, with a history of pulmonary fibrosis, presents after a recent hospitalization for an exacerbation of his condition. He experienced an episode of severe breathing difficulty, described as 'not getting enough oxygen to the brain,' which led to a collapse. He was initially seen in the emergency room and then transferred to [facility] where he was treated for a suspected IPF exacerbation with IV Solu-Medrol and discharged on a prednisone taper.  The patient reports a persistent cough and occasional difficulty clearing his throat, for which he takes Benadryl. He notes an improvement in his overall strength and oxygen saturation since the hospitalization. He is currently on a tapering course of prednisone and uses supplemental oxygen at home. He notes that his oxygen saturation drops into the high seventies when he is active without supplemental oxygen but quickly recovers with oxygen use.  The patient also reports vaping, despite the potential for lung irritation and exacerbation of his condition. He expresses uncertainty about the long-term prognosis of his pulmonary fibrosis and the impact of steroid treatment on disease progression.    Outpatient Encounter Medications as of 02/07/2023  Medication Sig   acetaminophen (TYLENOL) 500 MG tablet Take 1 tablet (500 mg total) by mouth every  6 (six) hours as needed for mild pain.   ALPRAZolam (XANAX) 0.5 MG tablet Take 0.5-1 tablets (0.25-0.5 mg total) by mouth 3 (three) times daily as  needed for anxiety or sleep. (Patient taking differently: Take 0.5 mg by mouth 4 (four) times daily as needed for anxiety.)   antiseptic oral rinse (BIOTENE) LIQD 15 mLs by Mouth Rinse route daily as needed for dry mouth.   atorvastatin (LIPITOR) 80 MG tablet Take 1 tablet (80 mg total) by mouth daily.   budesonide (PULMICORT) 0.5 MG/2ML nebulizer solution Take 2 mLs (0.5 mg total) by nebulization daily. (Patient taking differently: Take 0.5 mg by nebulization in the morning and at bedtime.)   carvedilol (COREG) 6.25 MG tablet Take 1 tablet (6.25 mg total) by mouth 2 (two) times daily.   cholecalciferol (VITAMIN D3) 25 MCG (1000 UNIT) tablet Take 1,000 Units by mouth 3 (three) times a week.   diphenhydrAMINE (BENADRYL) 25 mg capsule Take 50 mg by mouth 2 (two) times daily.   ELIQUIS 5 MG TABS tablet TAKE 1 TABLET BY MOUTH TWICE A DAY   guaiFENesin (MUCINEX) 600 MG 12 hr tablet Take 1 tablet (600 mg total) by mouth 2 (two) times daily as needed. (Patient taking differently: Take 600 mg by mouth daily.)   HYDROcodone-acetaminophen (NORCO/VICODIN) 5-325 MG tablet Take 1 tablet by mouth 5 (five) times daily.   hyoscyamine (LEVSIN, ANASPAZ) 0.125 MG tablet Take 0.125 mg by mouth every 4 (four) hours as needed for cramping.    nitroGLYCERIN (NITROSTAT) 0.4 MG SL tablet Place 1 tablet (0.4 mg total) under the tongue every 5 (five) minutes as needed for chest pain.   OXYGEN Inhale 5 L/min into the lungs continuous.   pantoprazole (PROTONIX) 40 MG tablet TAKE 1 TABLET BY MOUTH EVERY DAY BEFORE BREAKFAST (Patient taking differently: Take 40 mg by mouth daily before breakfast.)   PARoxetine (PAXIL) 20 MG tablet Take 20 mg by mouth daily.   Pirfenidone 801 MG TABS Take 1 tablet (801 mg total) by mouth 3 (three) times daily with meals. ([email or web address])   predniSONE (DELTASONE) 10 MG tablet Take 6 tablets (60 mg total) by mouth daily with breakfast for 6 days, THEN 5 tablets (50 mg total) daily with breakfast  for 7 days, THEN 4 tablets (40 mg total) daily with breakfast for 7 days, THEN 3 tablets (30 mg total) daily with breakfast for 7 days, THEN 2 tablets (20 mg total) daily with breakfast for 7 days, THEN 1 tablet (10 mg total) daily with breakfast for 7 days.   sodium chloride (OCEAN) 0.65 % SOLN nasal spray Place 1 spray into both nostrils daily as needed for congestion.   tamsulosin (FLOMAX) 0.4 MG CAPS capsule Take 0.4 mg by mouth in the morning and at bedtime.   triamcinolone cream (KENALOG) 0.1 % Apply 1 Application topically 2 (two) times daily as needed (for itching).   TYLENOL PM EXTRA STRENGTH 500-25 MG TABS tablet Take 1 tablet by mouth at bedtime.   arformoterol (BROVANA) 15 MCG/2ML NEBU Take 2 mLs (15 mcg total) by nebulization 2 (two) times daily. (Patient not taking: Reported on 02/07/2023)   No facility-administered encounter medications on file as of 02/07/2023.    Physical Exam: Blood pressure 132/86, pulse 65, temperature (!) 97.5 F (36.4 C), temperature source Oral, height 6' (1.829 m), weight 183 lb (83 kg), SpO2 97%. Gen:      No acute distress HEENT:  EOMI, sclera anicteric Neck:     No masses; no thyromegaly  Lungs:    Clear to auscultation bilaterally; normal respiratory effort CV:         Regular rate and rhythm; no murmurs Abd:      + bowel sounds; soft, non-tender; no palpable masses, no distension Ext:    No edema; adequate peripheral perfusion Skin:      Warm and dry; no rash Neuro: alert and oriented x 3 Psych: normal mood and affect   Data Reviewed: Imaging: CT chest 07/18/2013-interstitial fibrosis with traction bronchiectasis with basilar predominance, emphysema.  Probable UIP pattern CT abdomen pelvis 02/07/17- basal pulmonary fibrosis. CT high-res 12/18/2020-new spiculated lung nodule in the right upper lobe PET scan 01/05/2021-uptake in the right upper lobe, equivocal uptake in the right hilum.  Left sphenoid sinus disease. Chest x-ray 04/01/2021-dense  right lower lobe infiltrate CTA 04/13/2021-no PE, large right pneumothorax CTA 05/18/2021-left-sided pulmonary embolism, worsening groundglass opacities, airspace disease-right greater than left. High-resolution CT 07/13/21 - resolution of consolidation and asbestos disease. Worsening pattern of pulmonary fibrosis in UIP pattern CT chest 09/17/2021-stable pattern of pulmonary fibrosis CT chest 03/17/2022-stable findings of pulmonary fibrosis. CT chest 09/12/2022-stable findings of pulmonary fibrosis. CT high-resolution 01/19/2023-patchy consolidation, groundglass opacities throughout posterior left upper lobe, left lower lobe, baseline extensive pulmonary fibrosis. I have reviewed the images personally  PFTs: 11/27/2020 FVC 4.03 [85%], FEV1 2.95 [84%], F/F 73, TLC 6.09 [81%], DLCO 20.01 [73%] Minimal diffusion defect.  Labs: CTD serologies 11/26/2020-P ANCA 1:80  Cardiac: Cardiac cath 10/05/2020-normal left and right heart filling pressures.  Single-vessel coronary artery disease  Echocardiogram 05/19/2021- LVEF 60-65%, RV systolic function is normal with mild enlargement.  TR is signal is inadequate for measuring PA pressure.  Assessment:  Interstitial Lung Disease (ILD) exacerbation Recent hospitalization for possible ILD exacerbation with suspected pneumonia. Currently on a prednisone taper (40mg  starting tomorrow, decreasing by 10mg  every 7 days). Noted improvement in strength and oxygenation. -Continue prednisone taper as prescribed. -Avoid all lung irritants, including vaping. -Consider repeat lung scan in 6 months.  IPF Has baseline pulmonary fibrosis which seems to be in UIP pattern. Elevation in ANCA is likely nonspecific as he has no signs of connective tissue disease.  I reviewed his biopsy with Dr. 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  Overall presentation is consistent with IPF  Started Pirfenidone in May 2023.   Stable disease with improved physical  activity and strength. Patient is on Pirfenidone with photosensitivity reaction. Hepatic panel in sept 2024 are ok  -Continue Pirfenidone, with caution to avoid sun exposure and use sun protection. -Consider alternative medication (Nintedanib) if photosensitivity becomes intolerable. -Annual CT scans to monitor disease progression.  Oxygen Dependence Using supplemental oxygen at home, with noted desaturation to high 70s when active without oxygen. -Continue home oxygen use, especially during activity.  Medication Access Not currently on Brovana due to approval issues. -Address approval issue for Brovana if necessary.  Lower extremity edema Has intermittent lower extremity edema and is on diuretics Echo and cardiac cath within the last year reviewed with no significant pulmonary hypertension.  Continue monitoring  Recurrent right pneumothorax Resolved on repeat imaging  Right upper lobe lung adenocarcinoma S/p bronchoscopy and lobectomy Continue surveillance CT scan  Submassive PE Continue Eliquis  General Health Maintenance -Ensure up-to-date with vaccinations, including COVID-19, influenza, and RSV. -Follow-up in 6 months.    Plan/Recommendations: Continue Esbriet Prednisone taper Follow-up CT scan  Chilton Greathouse MD [facility] 02/07/2023, 11:36 AM  CC: [name], MD

## 2023-02-08 ENCOUNTER — Other Ambulatory Visit (HOSPITAL_COMMUNITY): Payer: Self-pay

## 2023-02-08 DIAGNOSIS — N401 Enlarged prostate with lower urinary tract symptoms: Secondary | ICD-10-CM | POA: Diagnosis not present

## 2023-02-08 DIAGNOSIS — N201 Calculus of ureter: Secondary | ICD-10-CM | POA: Diagnosis not present

## 2023-02-08 DIAGNOSIS — R310 Gross hematuria: Secondary | ICD-10-CM | POA: Diagnosis not present

## 2023-02-08 DIAGNOSIS — N210 Calculus in bladder: Secondary | ICD-10-CM | POA: Diagnosis not present

## 2023-02-08 DIAGNOSIS — R351 Nocturia: Secondary | ICD-10-CM | POA: Diagnosis not present

## 2023-02-08 NOTE — Telephone Encounter (Signed)
Prior authorization is not required. Co-pay is $25.49.

## 2023-02-11 NOTE — Telephone Encounter (Signed)
Sent mychart to pt of information from prior [employer].

## 2023-02-26 DIAGNOSIS — J84112 Idiopathic pulmonary fibrosis: Secondary | ICD-10-CM | POA: Diagnosis not present

## 2023-03-02 ENCOUNTER — Telehealth: Payer: Self-pay | Admitting: Pulmonary Disease

## 2023-03-02 ENCOUNTER — Other Ambulatory Visit: Payer: Self-pay | Admitting: Urology

## 2023-03-02 ENCOUNTER — Encounter: Payer: Self-pay | Admitting: Internal Medicine

## 2023-03-02 NOTE — Telephone Encounter (Signed)
 [facility] calling. Patient needs surgical clearance for upcoming procedure on January 14th. Last seen December 10th,2024.

## 2023-03-03 NOTE — Telephone Encounter (Signed)
 Called [facility] for Rhonda   Need to know what procedure he is having and under what sort of anesthesia  Will await her return call.

## 2023-03-06 NOTE — Telephone Encounter (Signed)
 Rhonda from [facility] is returning missed call.

## 2023-03-06 NOTE — Telephone Encounter (Signed)
 Anesthesia is: General Anesthesia Surgery is: Ureteroscopy w/a stint placement.  Give them a call. Msg OKBjorn Loser [phone]

## 2023-03-07 NOTE — Patient Instructions (Addendum)
 SURGICAL WAITING ROOM VISITATION Patients having surgery or a procedure may have no more than 2 support people in the waiting area - these visitors may rotate.    Children under the age of 90 must have an adult with them who is not the patient.  If the patient needs to stay at the hospital during part of their recovery, the visitor guidelines for inpatient rooms apply. Pre-op nurse will coordinate an appropriate time for 1 support person to accompany patient in pre-op.  This support person may not rotate.    Please refer to the [facility] website for the visitor guidelines for Inpatients (after your surgery is over and you are in a regular room).       Your procedure is scheduled on: 03-14-23   Report to [facility] Main Entrance    Report to admitting at 6:15 AM   Call this number if you have problems the morning of surgery [phone]   Do not eat food or drink liqiuids :After Midnight.            If you have questions, please contact your surgeon's office.   FOLLOW  ANY ADDITIONAL PRE OP INSTRUCTIONS YOU RECEIVED FROM YOUR SURGEON'S OFFICE!!!     Oral Hygiene is also important to reduce your risk of infection.                                    Remember - BRUSH YOUR TEETH THE MORNING OF SURGERY WITH YOUR REGULAR TOOTHPASTE   Do NOT smoke after Midnight   Take these medicines the morning of surgery with A SIP OF WATER:   Atorvastatin   Carvedilol   Pantoprazole   Paroxetine   Pirfenidone   Tamsulosin   If needed Alprazolam , Hydrocodone   Stop all vitamins and herbal supplements 7 days before surgery                              You may not have any metal on your body including, jewelry, and body piercing             Do not wear lotions, powders, cologne, or deodorant              Men may shave face and neck.   Do not bring valuables to the hospital. [facility] IS NOT RESPONSIBLE   FOR VALUABLES.   Contacts, dentures or bridgework may not be worn into  surgery.  DO NOT BRING YOUR HOME MEDICATIONS TO THE HOSPITAL. PHARMACY WILL DISPENSE MEDICATIONS LISTED ON YOUR MEDICATION LIST TO YOU DURING YOUR ADMISSION IN THE HOSPITAL!    Patients discharged on the day of surgery will not be allowed to drive home.  Someone NEEDS to stay with you for the first 24 hours after anesthesia.   Special Instructions: Bring a copy of your healthcare power of attorney and living will documents the day of surgery if you haven't scanned them before.              Please read over the following fact sheets you were given: IF YOU HAVE QUESTIONS ABOUT YOUR PRE-OP INSTRUCTIONS PLEASE CALL [phone] [facility]  If you received a COVID test during your pre-op visit  it is requested that you wear a mask when out in public, stay away from anyone that may not be feeling well and notify your surgeon if you develop symptoms. If you test  positive for Covid or have been in contact with anyone that has tested positive in the last 10 days please notify you surgeon.   - Preparing for Surgery Before surgery, you can play an important role.  Because skin is not sterile, your skin needs to be as free of germs as possible.  You can reduce the number of germs on your skin by washing with CHG (chlorahexidine gluconate) soap before surgery.  CHG is an antiseptic cleaner which kills germs and bonds with the skin to continue killing germs even after washing. Please DO NOT use if you have an allergy to CHG or antibacterial soaps.  If your skin becomes reddened/irritated stop using the CHG and inform your nurse when you arrive at Short Stay. Do not shave (including legs and underarms) for at least 48 hours prior to the first CHG shower.  You may shave your face/neck.  Please follow these instructions carefully:  1.  Shower with CHG Soap the night before surgery and the  morning of surgery.  2.  If you choose to wash your hair, wash your hair first as usual with your normal  shampoo.  3.   After you shampoo, rinse your hair and body thoroughly to remove the shampoo.                             4.  Use CHG as you would any other liquid soap.  You can apply chg directly to the skin and wash.  Gently with a scrungie or clean washcloth.  5.  Apply the CHG Soap to your body ONLY FROM THE NECK DOWN.   Do   not use on face/ open                           Wound or open sores. Avoid contact with eyes, ears mouth and   genitals (private parts).                       Wash face,  Genitals (private parts) with your normal soap.             6.  Wash thoroughly, paying special attention to the area where your    surgery  will be performed.  7.  Thoroughly rinse your body with warm water from the neck down.  8.  DO NOT shower/wash with your normal soap after using and rinsing off the CHG Soap.                9.  Pat yourself dry with a clean towel.            10.  Wear clean pajamas.            11.  Place clean sheets on your bed the night of your first shower and do not  sleep with pets. Day of Surgery : Do not apply any lotions/deodorants the morning of surgery.  Please wear clean clothes to the hospital/surgery center.  FAILURE TO FOLLOW THESE INSTRUCTIONS MAY RESULT IN THE CANCELLATION OF YOUR SURGERY  PATIENT SIGNATURE_________________________________  NURSE SIGNATURE__________________________________  ________________________________________________________________________

## 2023-03-08 NOTE — Telephone Encounter (Signed)
 Dr Isaiah Serge, please advise on risk assessment for the below procedure- thanks

## 2023-03-09 NOTE — Progress Notes (Addendum)
 COVID Vaccine Completed:  Yes  Date of COVID positive in last 90 days:  No  PCP - Tanda Bame, MD Cardiologist - Darryle Decent, MD Pulmonologist - Lonna Coder, MD  Chest x-ray - 01-18-23 Epic EKG - 01-20-23 Epic Stress Test - Chemical several years ago ECHO - 05-19-21 Epic Cardiac Cath - 10-05-20 Epic Pacemaker/ICD device last checked: Spinal Cord Stimulator:  Bowel Prep - N/A  Sleep Study - Yes, +sleep apnea CPAP - No  Fasting Blood Sugar - N/A Checks Blood Sugar _____ times a day  Last dose of GLP1 agonist-  N/A GLP1 instructions:  Hold 7 days before surgery    Last dose of SGLT-2 inhibitors-  N/A SGLT-2 instructions:  Hold 3 days before surgery    Blood Thinner Instructions:  Eliquis , per patient and wife don't have to stop Aspirin  Instructions: Last Dose:  Activity level:  Can go up a flight of stairs and perform activities of daily living without stopping and without symptoms of chest pain.  Patient has chronic shortness of breath and uses 5 L of O2 at all times.   Anesthesia review:  CAD with hx of CABG, AAA,  HTN, OSA, PVD , pulmonary fibrosis,  Home oxygen  at 5 L at all times.    Patient denies fever, cough and chest pain at PAT appointment  Patient verbalized understanding of instructions that were given to them at the PAT appointment. Patient was also instructed that they will need to review over the PAT instructions again at home before surgery.

## 2023-03-10 ENCOUNTER — Encounter (HOSPITAL_COMMUNITY)
Admission: RE | Admit: 2023-03-10 | Discharge: 2023-03-10 | Disposition: A | Discharge status: Home / Self Care | Payer: PPO | Source: Ambulatory Visit | Attending: Urology | Admitting: Urology

## 2023-03-10 ENCOUNTER — Encounter (HOSPITAL_COMMUNITY): Payer: Self-pay

## 2023-03-10 ENCOUNTER — Other Ambulatory Visit: Payer: Self-pay

## 2023-03-10 VITALS — BP 114/79 | HR 74 | Temp 98.0°F | Resp 24 | Ht 72.0 in | Wt 188.0 lb

## 2023-03-10 DIAGNOSIS — Z01812 Encounter for preprocedural laboratory examination: Secondary | ICD-10-CM | POA: Insufficient documentation

## 2023-03-10 DIAGNOSIS — G4733 Obstructive sleep apnea (adult) (pediatric): Secondary | ICD-10-CM | POA: Insufficient documentation

## 2023-03-10 DIAGNOSIS — Z7901 Long term (current) use of anticoagulants: Secondary | ICD-10-CM | POA: Insufficient documentation

## 2023-03-10 DIAGNOSIS — Z87891 Personal history of nicotine dependence: Secondary | ICD-10-CM | POA: Diagnosis not present

## 2023-03-10 DIAGNOSIS — I2510 Atherosclerotic heart disease of native coronary artery without angina pectoris: Secondary | ICD-10-CM | POA: Insufficient documentation

## 2023-03-10 DIAGNOSIS — N201 Calculus of ureter: Secondary | ICD-10-CM | POA: Insufficient documentation

## 2023-03-10 DIAGNOSIS — Z86711 Personal history of pulmonary embolism: Secondary | ICD-10-CM | POA: Diagnosis not present

## 2023-03-10 DIAGNOSIS — Z86718 Personal history of other venous thrombosis and embolism: Secondary | ICD-10-CM | POA: Insufficient documentation

## 2023-03-10 DIAGNOSIS — Z951 Presence of aortocoronary bypass graft: Secondary | ICD-10-CM | POA: Diagnosis not present

## 2023-03-10 DIAGNOSIS — J8410 Pulmonary fibrosis, unspecified: Secondary | ICD-10-CM | POA: Diagnosis not present

## 2023-03-10 DIAGNOSIS — I10 Essential (primary) hypertension: Secondary | ICD-10-CM | POA: Insufficient documentation

## 2023-03-10 HISTORY — DX: Dependence on supplemental oxygen: Z99.81

## 2023-03-10 HISTORY — DX: Personal history of urinary calculi: Z87.442

## 2023-03-10 HISTORY — DX: Malignant neoplasm of unspecified part of unspecified bronchus or lung: C34.90

## 2023-03-10 LAB — BASIC METABOLIC PANEL
Anion gap: 5 (ref 5–15)
BUN: 23 mg/dL (ref 8–23)
CO2: 27 mmol/L (ref 22–32)
Calcium: 8.8 mg/dL — ABNORMAL LOW (ref 8.9–10.3)
Chloride: 102 mmol/L (ref 98–111)
Creatinine, Ser: 0.73 mg/dL (ref 0.61–1.24)
GFR, Estimated: >60 mL/min (ref >60)
Glucose, Bld: 96 mg/dL (ref 70–99)
Potassium: 3.9 mmol/L (ref 3.5–5.1)
Sodium: 134 mmol/L — ABNORMAL LOW (ref 135–145)

## 2023-03-10 NOTE — Progress Notes (Addendum)
 Anesthesia Chart Review   Case: [id number] Date/Time: 03/14/23 0815   Procedure: CYSTOSCOPY/ RIGHT RETROGRADE PYELOGRAM, URETEROSCOPY/HOLMIUM LASER/STENT PLACEMENT (Right) - 60 MINUTE CASE   Anesthesia type: General   Pre-op diagnosis: RIGHT URETERAL STONE   Location: [facility] ROOM 04 / [facility]   Surgeons: Elisabeth Valli BIRCH, MD       DISCUSSION:72 y.o. former smoker with h/o HTN, OSA on CPAP, pulmonary fibrosis, on continuous 5L home O2, CAD s/p CABG, AAA, PE/DVT on Eliquis , BPH, right ureteral stone scheduled for above procedure 03/14/2023 with Dr. Valli Elisabeth.   Admitted for acute on chronic respiratory failure November of 2024.  Hospital follow up with pulmonology 02/07/2023. Per OV note, improved in strength and oxygenation.  Stable with 6 month follow up recommended. Stable at PAT visit, evaluate DOS.   Last seen by cardiology 12/01/2022. Per OV note no recurrent angina, no indication for ischemic evaluation per note. 3-4 month follow up recommended.   Echo 2023 with EF 60-65%, no valvular problems, small pericardial effusion.   VS: BP 114/79   Pulse 74   Temp 36.7 C (Oral)   Resp (!) 24   Ht 6' (1.829 m)   Wt 85.3 kg   SpO2 94%   BMI 25.50 kg/m   PROVIDERS: Rexanne Ingle, MD is PCP   Cardiologist - Darryle Decent, MD  Pulmonologist - Lonna Coder, MD LABS: Labs reviewed: Acceptable for surgery. (all labs ordered are listed, but only abnormal results are displayed)  Labs Reviewed  BASIC METABOLIC PANEL - Abnormal; Notable for the following components:      Result Value   Sodium 134 (*)    Calcium  8.8 (*)    All other components within normal limits     IMAGES:   EKG:   CV: Echo 05/19/2021 1. Left ventricular ejection fraction, by estimation, is 60 to 65%. The  left ventricle has normal function. The left ventricle has no regional  wall motion abnormalities. Left ventricular diastolic parameters are  indeterminate.   2. Right ventricular systolic function  is normal. The right ventricular  size is mildly enlarged. Tricuspid regurgitation signal is inadequate for  assessing PA pressure.   3. A small pericardial effusion is present. There is no evidence of  cardiac tamponade.   4. The mitral valve is grossly normal. No evidence of mitral valve  regurgitation. No evidence of mitral stenosis.   5. The aortic valve is tricuspid. Aortic valve regurgitation is not  visualized. No aortic stenosis is present.  Cardiac Cath 10/05/2020 Conclusions: Severe single-vessel coronary artery disease with chronic total occlusion of the ostial RCA.  No significant disease noted in the left coronary artery. Patent SVG-RCA with patent mid-graft stent and moderate proximal/mid graft disease that is stable or minimally worse compared to prior catheterization in 2012. Normal left and right heart filling pressures. Low normal Fick cardiac output/index.   Recommendations: Continue aggressive secondary prevention. Escalate medical therapy and consider further evaluation for alternative causes of shortness of breath and chest pain.  Past Medical History:  Diagnosis Date   Anxiety    Arthritis    lower back (04/07/2017)   BPH (benign prostatic hypertrophy)    C. difficile diarrhea    Chronic lower back pain    Coronary artery disease 1989   cabg with dvg to rca    Depression    GERD (gastroesophageal reflux disease)    History of kidney stones    Hyperlipidemia    Hypertension    Insomnia  Lung cancer (HCC)    Myocardial infarction (HCC) 2002   On home oxygen  therapy    OSA on CPAP    cpap setting of 60 per pt   Osteoarthritis    hips (04/07/2017)   PAD (peripheral artery disease) (HCC)    Peripheral vascular disease (HCC)    Pneumonia 1989   S/P CABG   Pulmonary embolism (HCC) 05/18/2021   Pulmonary fibrosis (HCC)    one time (04/07/2017)   Spinal stenosis     Past Surgical History:  Procedure Laterality Date   ABDOMINAL AORTAGRAM N/A  03/15/2011   Procedure: ABDOMINAL EZELLA;  Surgeon: Gaile LELON New, MD;  Location: [facility] CATH LAB;  Service: Cardiovascular;  Laterality: N/A;   ABDOMINAL AORTAGRAM N/A 04/12/2011   Procedure: ABDOMINAL EZELLA;  Surgeon: Gaile LELON New, MD;  Location: [facility] CATH LAB;  Service: Cardiovascular;  Laterality: N/A;   BACK SURGERY     BRONCHIAL BIOPSY  03/03/2021   Procedure: BRONCHIAL BIOPSIES;  Surgeon: Brenna Adine CROME, DO;  Location: [facility];  Service: Pulmonary;;   BRONCHIAL BRUSHINGS  03/03/2021   Procedure: BRONCHIAL BRUSHINGS;  Surgeon: Brenna Adine CROME, DO;  Location: [facility];  Service: Pulmonary;;   BRONCHIAL NEEDLE ASPIRATION BIOPSY  03/03/2021   Procedure: BRONCHIAL NEEDLE ASPIRATION BIOPSIES;  Surgeon: Brenna Adine CROME, DO;  Location: [facility];  Service: Pulmonary;;   CARDIAC CATHETERIZATION  08/1987   CORONARY ANGIOPLASTY WITH STENT PLACEMENT  ~ 2002   [facility], [place]   CORONARY ARTERY BYPASS GRAFT  09/1987   CABG X1;  [facility]; [place]; RCA   FIDUCIAL MARKER PLACEMENT  03/03/2021   Procedure: FIDUCIAL DYE MARKING;  Surgeon: Brenna Adine CROME, DO;  Location: [facility];  Service: Pulmonary;;   INTERCOSTAL NERVE BLOCK  03/03/2021   Procedure: INTERCOSTAL NERVE BLOCK;  Surgeon: Shyrl Linnie KIDD, MD;  Location: [facility];  Service: Thoracic;;   JOINT REPLACEMENT     KNEE ARTHROSCOPY Left    LAPAROSCOPIC CHOLECYSTECTOMY     LYMPH NODE BIOPSY  03/03/2021   Procedure: LYMPH NODE BIOPSY;  Surgeon: Shyrl Linnie KIDD, MD;  Location: [facility];  Service: Thoracic;;   LYMPH NODE DISSECTION  03/03/2021   Procedure: LYMPH NODE DISSECTION;  Surgeon: Shyrl Linnie KIDD, MD;  [facility]: [facility];  Service: Thoracic;;   PERIPHERAL VASCULAR INTERVENTION Left 2016   put a stent; right branch of left femoral   POSTERIOR LUMBAR FUSION  2009   lumbar fusion, S L 5 to L 4   RIGHT/LEFT HEART CATH AND CORONARY/GRAFT ANGIOGRAPHY N/A 10/05/2020   Procedure: RIGHT/LEFT HEART CATH  AND CORONARY/GRAFT ANGIOGRAPHY;  Surgeon: Mady Bruckner, MD;  [facility];  Service: Cardiovascular;  Laterality: N/A;   SHOULDER ARTHROSCOPY W/ ROTATOR CUFF REPAIR Right 2006   SHOULDER ARTHROSCOPY WITH SUBACROMIAL DECOMPRESSION AND OPEN ROTATOR C Left ~ 1983   TONSILLECTOMY AND ADENOIDECTOMY     TOTAL HIP ARTHROPLASTY Right 2012   TOTAL HIP ARTHROPLASTY Left 04/07/2017   TOTAL HIP ARTHROPLASTY Left 04/07/2017   Procedure: LEFT TOTAL HIP ARTHROPLASTY;  Surgeon: Shari Sieving, MD;  [facility];  Service: Orthopedics;  Laterality: Left;   VIDEO BRONCHOSCOPY WITH RADIAL ENDOBRONCHIAL ULTRASOUND  03/03/2021   Procedure: VIDEO BRONCHOSCOPY WITH RADIAL ENDOBRONCHIAL ULTRASOUND;  Surgeon: Brenna Adine CROME, DO;  [facility];  Service: Pulmonary;;    MEDICATIONS:  acetaminophen  (TYLENOL ) 500 MG tablet   ALPRAZolam  (XANAX ) 0.5 MG tablet   antiseptic oral rinse (BIOTENE) LIQD   arformoterol  (BROVANA )  15 MCG/2ML NEBU   atorvastatin  (LIPITOR ) 80 MG tablet   budesonide  (PULMICORT ) 0.5 MG/2ML nebulizer solution   carvedilol  (COREG ) 6.25 MG tablet   cholecalciferol  (VITAMIN D3) 25 MCG (1000 UNIT) tablet   diphenhydrAMINE  (BENADRYL ) 25 mg capsule   ELIQUIS  5 MG TABS tablet   guaiFENesin  (MUCINEX ) 600 MG 12 hr tablet   HYDROcodone -acetaminophen  (NORCO/VICODIN) 5-325 MG tablet   hyoscyamine  (LEVSIN , ANASPAZ ) 0.125 MG tablet   nitroGLYCERIN  (NITROSTAT ) 0.4 MG SL tablet   OXYGEN    pantoprazole  (PROTONIX ) 40 MG tablet   PARoxetine  (PAXIL ) 20 MG tablet   Pirfenidone  801 MG TABS   sodium chloride  (OCEAN) 0.65 % SOLN nasal spray   tamsulosin  (FLOMAX ) 0.4 MG CAPS capsule   triamcinolone  cream (KENALOG ) 0.1 %   No current facility-administered medications for this encounter.    Harlene Hoots Ward, PA-C [facility] [phone]

## 2023-03-10 NOTE — Telephone Encounter (Signed)
 Rhonda checking on surgical clearance. Rhonda phone number is [phone].
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
## 2023-03-13 NOTE — Telephone Encounter (Signed)
 Rhonda checking on surgical clearance. Patient having surgery 03/14/2023. Rhonda phone number is [phone].

## 2023-03-13 NOTE — Telephone Encounter (Signed)
 Spoke with Richard Parrish  They may need to cancel the procedure if not clearance today  Routing back to [facility] and also contacted him via Epic chat and by text

## 2023-03-13 NOTE — Anesthesia Preprocedure Evaluation (Signed)
 Anesthesia Evaluation  Patient identified by MRN, date of birth, ID band Patient awake    Reviewed: Allergy & Precautions, H&P , NPO status , Patient's Chart, lab work & pertinent test results, reviewed documented beta blocker date and time   Airway Mallampati: II  TM Distance: >3 FB Neck ROM: Full    Dental no notable dental hx. (+) Teeth Intact, Dental Advisory Given   Pulmonary sleep apnea , COPD, former smoker   Pulmonary exam normal breath sounds clear to auscultation       Cardiovascular hypertension, Pt. on medications and Pt. on home beta blockers + CAD, + Past MI, + CABG and + Peripheral Vascular Disease   Rhythm:Regular Rate:Normal     Neuro/Psych  PSYCHIATRIC DISORDERS Anxiety Depression    negative neurological ROS     GI/Hepatic ,GERD  Medicated,,  Endo/Other  negative endocrine ROS    Renal/GU RIGHT URETERAL STONE  negative genitourinary   Musculoskeletal  (+) Arthritis , Osteoarthritis,    Abdominal   Peds  Hematology negative hematology ROS (+)   Anesthesia Other Findings   Reproductive/Obstetrics negative OB ROS                              Anesthesia Physical Anesthesia Plan  ASA: 3  Anesthesia Plan: General   Post-op Pain Management: Tylenol  PO (pre-op)   Induction: Intravenous  PONV Risk Score and Plan: 3 and Ondansetron , Dexamethasone  and Treatment may vary due to age or medical condition  Airway Management Planned: LMA  Additional Equipment:   Intra-op Plan:   Post-operative Plan: Extubation in OR  Informed Consent: I have reviewed the patients History and Physical, chart, labs and discussed the procedure including the risks, benefits and alternatives for the proposed anesthesia with the patient or authorized representative who has indicated his/her understanding and acceptance.     Dental advisory given  Plan Discussed with: CRNA and  Surgeon  Anesthesia Plan Comments: (Hx of CAD s/p CABG to the RCA following failed PCI 1989 with subsequent DES to graft, PAD followed by vascular surgery, AAA, hypertension.   OSA on CPAP, inconsistent use.  EKG 03/02/21: Sinus bradycardia. Rate 58.   TTE 10/04/20:  1. Left ventricular ejection fraction, by estimation, is 60 to 65%. The  left ventricle has normal function. The left ventricle has no regional  wall motion abnormalities. There is moderate left ventricular hypertrophy.  Left ventricular diastolic  parameters are consistent with Grade I diastolic dysfunction (impaired  relaxation).   2. Right ventricular systolic function is mildly reduced at the apex. The  right ventricular size is mildly enlarged. Tricuspid regurgitation signal  is inadequate for assessing PA pressure.   3. The mitral valve is normal in structure. Trivial mitral valve  regurgitation. No evidence of mitral stenosis.   4. The aortic valve is grossly normal. There is mild calcification of the  aortic valve. Aortic valve regurgitation is not visualized. Mild aortic  valve sclerosis is present, with no evidence of aortic valve stenosis.   5. The inferior vena cava is normal in size with greater than 50%  respiratory variability, suggesting right atrial pressure of 3 mmHg.   )         Anesthesia Quick Evaluation

## 2023-03-14 ENCOUNTER — Encounter (HOSPITAL_COMMUNITY)
Admission: RE | Disposition: A | Discharge status: Home / Self Care | Payer: Self-pay | Source: Home / Self Care | Attending: Urology

## 2023-03-14 ENCOUNTER — Ambulatory Visit (HOSPITAL_COMMUNITY): Payer: Self-pay | Admitting: Physician Assistant

## 2023-03-14 ENCOUNTER — Ambulatory Visit (HOSPITAL_COMMUNITY): Payer: PPO

## 2023-03-14 ENCOUNTER — Ambulatory Visit (HOSPITAL_COMMUNITY)
Admission: RE | Admit: 2023-03-14 | Discharge: 2023-03-14 | Disposition: A | Discharge status: Home / Self Care | Payer: PPO | Attending: Urology | Admitting: Urology

## 2023-03-14 ENCOUNTER — Telehealth: Payer: Self-pay | Admitting: Pharmacist

## 2023-03-14 ENCOUNTER — Ambulatory Visit (HOSPITAL_BASED_OUTPATIENT_CLINIC_OR_DEPARTMENT_OTHER): Payer: PPO

## 2023-03-14 ENCOUNTER — Other Ambulatory Visit: Payer: Self-pay

## 2023-03-14 ENCOUNTER — Encounter: Payer: Self-pay | Admitting: Pulmonary Disease

## 2023-03-14 ENCOUNTER — Encounter (HOSPITAL_COMMUNITY): Payer: Self-pay | Admitting: Urology

## 2023-03-14 DIAGNOSIS — I252 Old myocardial infarction: Secondary | ICD-10-CM | POA: Insufficient documentation

## 2023-03-14 DIAGNOSIS — Z79899 Other long term (current) drug therapy: Secondary | ICD-10-CM | POA: Diagnosis not present

## 2023-03-14 DIAGNOSIS — R310 Gross hematuria: Secondary | ICD-10-CM | POA: Insufficient documentation

## 2023-03-14 DIAGNOSIS — J449 Chronic obstructive pulmonary disease, unspecified: Secondary | ICD-10-CM | POA: Diagnosis not present

## 2023-03-14 DIAGNOSIS — Z85118 Personal history of other malignant neoplasm of bronchus and lung: Secondary | ICD-10-CM | POA: Diagnosis not present

## 2023-03-14 DIAGNOSIS — R351 Nocturia: Secondary | ICD-10-CM | POA: Insufficient documentation

## 2023-03-14 DIAGNOSIS — Z7901 Long term (current) use of anticoagulants: Secondary | ICD-10-CM | POA: Insufficient documentation

## 2023-03-14 DIAGNOSIS — K219 Gastro-esophageal reflux disease without esophagitis: Secondary | ICD-10-CM | POA: Diagnosis not present

## 2023-03-14 DIAGNOSIS — N201 Calculus of ureter: Secondary | ICD-10-CM | POA: Insufficient documentation

## 2023-03-14 DIAGNOSIS — N210 Calculus in bladder: Secondary | ICD-10-CM | POA: Insufficient documentation

## 2023-03-14 DIAGNOSIS — N401 Enlarged prostate with lower urinary tract symptoms: Secondary | ICD-10-CM | POA: Insufficient documentation

## 2023-03-14 DIAGNOSIS — Z87891 Personal history of nicotine dependence: Secondary | ICD-10-CM | POA: Diagnosis not present

## 2023-03-14 DIAGNOSIS — Z951 Presence of aortocoronary bypass graft: Secondary | ICD-10-CM | POA: Insufficient documentation

## 2023-03-14 DIAGNOSIS — I2510 Atherosclerotic heart disease of native coronary artery without angina pectoris: Secondary | ICD-10-CM | POA: Insufficient documentation

## 2023-03-14 DIAGNOSIS — I10 Essential (primary) hypertension: Secondary | ICD-10-CM

## 2023-03-14 DIAGNOSIS — Z902 Acquired absence of lung [part of]: Secondary | ICD-10-CM | POA: Insufficient documentation

## 2023-03-14 DIAGNOSIS — I739 Peripheral vascular disease, unspecified: Secondary | ICD-10-CM | POA: Insufficient documentation

## 2023-03-14 DIAGNOSIS — F32A Depression, unspecified: Secondary | ICD-10-CM | POA: Diagnosis not present

## 2023-03-14 DIAGNOSIS — F419 Anxiety disorder, unspecified: Secondary | ICD-10-CM | POA: Insufficient documentation

## 2023-03-14 DIAGNOSIS — J849 Interstitial pulmonary disease, unspecified: Secondary | ICD-10-CM

## 2023-03-14 HISTORY — PX: CYSTOSCOPY/URETEROSCOPY/HOLMIUM LASER/STENT PLACEMENT: SHX6546

## 2023-03-14 SURGERY — CYSTOSCOPY/URETEROSCOPY/HOLMIUM LASER/STENT PLACEMENT
Anesthesia: General | Laterality: Right

## 2023-03-14 MED ORDER — SODIUM CHLORIDE 0.9 % IR SOLN
Status: DC | PRN
  Administered 2023-03-14: 3000 mL

## 2023-03-14 MED ORDER — CEFAZOLIN SODIUM-DEXTROSE 2-4 GM/100ML-% IV SOLN
2.0000 g | INTRAVENOUS | Status: AC
  Administered 2023-03-14: 2 g via INTRAVENOUS
  Filled 2023-03-14: qty 100

## 2023-03-14 MED ORDER — 0.9 % SODIUM CHLORIDE (POUR BTL) OPTIME
TOPICAL | Status: DC | PRN
  Administered 2023-03-14: 1000 mL

## 2023-03-14 MED ORDER — FENTANYL CITRATE PF 50 MCG/ML IJ SOSY
25.0000 ug | PREFILLED_SYRINGE | INTRAMUSCULAR | Status: DC | PRN
Start: 2023-03-14 — End: 2023-03-14

## 2023-03-14 MED ORDER — FENTANYL CITRATE (PF) 100 MCG/2ML IJ SOLN
INTRAMUSCULAR | Status: DC | PRN
  Administered 2023-03-14: 25 ug via INTRAVENOUS
  Administered 2023-03-14: 50 ug via INTRAVENOUS
  Administered 2023-03-14: 25 ug via INTRAVENOUS

## 2023-03-14 MED ORDER — OXYCODONE HCL 5 MG/5ML PO SOLN: 5.0000 mg | Freq: Once | ORAL | Status: AC | PRN

## 2023-03-14 MED ORDER — ORAL CARE MOUTH RINSE: 15.0000 mL | Freq: Once | OROMUCOSAL | Status: AC

## 2023-03-14 MED ORDER — FENTANYL CITRATE (PF) 100 MCG/2ML IJ SOLN
INTRAMUSCULAR | Status: AC
  Filled 2023-03-14: qty 2

## 2023-03-14 MED ORDER — LIDOCAINE HCL (PF) 2 % IJ SOLN
INTRAMUSCULAR | Status: AC
  Filled 2023-03-14: qty 5

## 2023-03-14 MED ORDER — OXYCODONE HCL 5 MG PO TABS
ORAL_TABLET | ORAL | Status: AC
  Filled 2023-03-14: qty 1

## 2023-03-14 MED ORDER — PROPOFOL 10 MG/ML IV BOLUS
INTRAVENOUS | Status: AC
  Filled 2023-03-14: qty 20

## 2023-03-14 MED ORDER — IOHEXOL 300 MG/ML  SOLN: INTRAMUSCULAR | Status: DC | PRN

## 2023-03-14 MED ORDER — ACETAMINOPHEN 10 MG/ML IV SOLN: 1000.0000 mg | Freq: Once | INTRAVENOUS | Status: DC | PRN

## 2023-03-14 MED ORDER — TRAMADOL HCL 50 MG PO TABS: 50.0000 mg | ORAL_TABLET | Freq: Four times a day (QID) | ORAL | 0 refills | Status: DC | PRN

## 2023-03-14 MED ORDER — CHLORHEXIDINE GLUCONATE 0.12 % MT SOLN
15.0000 mL | Freq: Once | OROMUCOSAL | Status: AC
  Administered 2023-03-14: 15 mL via OROMUCOSAL

## 2023-03-14 MED ORDER — DEXAMETHASONE SODIUM PHOSPHATE 10 MG/ML IJ SOLN
INTRAMUSCULAR | Status: DC | PRN
  Administered 2023-03-14: 10 mg via INTRAVENOUS

## 2023-03-14 MED ORDER — LIDOCAINE HCL (CARDIAC) PF 100 MG/5ML IV SOSY
PREFILLED_SYRINGE | INTRAVENOUS | Status: DC | PRN
  Administered 2023-03-14: 100 mg via INTRAVENOUS

## 2023-03-14 MED ORDER — LACTATED RINGERS IV SOLN: INTRAVENOUS | Status: DC | PRN

## 2023-03-14 MED ORDER — EPHEDRINE SULFATE-NACL 50-0.9 MG/10ML-% IV SOSY
PREFILLED_SYRINGE | INTRAVENOUS | Status: DC | PRN
  Administered 2023-03-14: 15 mg via INTRAVENOUS

## 2023-03-14 MED ORDER — ONDANSETRON HCL 4 MG/2ML IJ SOLN
INTRAMUSCULAR | Status: DC | PRN
  Administered 2023-03-14: 4 mg via INTRAVENOUS

## 2023-03-14 MED ORDER — EPHEDRINE 5 MG/ML INJ
INTRAVENOUS | Status: AC
Start: 2023-03-14 — End: ?
  Filled 2023-03-14: qty 5

## 2023-03-14 MED ORDER — PROPOFOL 10 MG/ML IV BOLUS
INTRAVENOUS | Status: DC | PRN
  Administered 2023-03-14: 120 mg via INTRAVENOUS

## 2023-03-14 MED ORDER — OXYCODONE HCL 5 MG PO TABS
5.0000 mg | ORAL_TABLET | Freq: Once | ORAL | Status: AC | PRN
  Administered 2023-03-14: 5 mg via ORAL

## 2023-03-14 MED ORDER — DROPERIDOL 2.5 MG/ML IJ SOLN
0.6250 mg | Freq: Once | INTRAMUSCULAR | Status: DC | PRN
Start: 2023-03-14 — End: 2023-03-14

## 2023-03-14 MED ORDER — CEPHALEXIN 500 MG PO CAPS: 500.0000 mg | ORAL_CAPSULE | Freq: Once | ORAL | 0 refills | Status: AC

## 2023-03-14 SURGICAL SUPPLY — 21 items
BAG URO CATCHER STRL LF (MISCELLANEOUS) ×1 IMPLANT
BASKET ZERO TIP NITINOL 2.4FR (BASKET) IMPLANT
CATH URETL OPEN 5X70 (CATHETERS) ×1 IMPLANT
CLOTH BEACON ORANGE TIMEOUT ST (SAFETY) ×1 IMPLANT
DRSG TEGADERM 2-3/8X2-3/4 SM (GAUZE/BANDAGES/DRESSINGS) IMPLANT
EXTRACTOR STONE 1.7FRX115CM (UROLOGICAL SUPPLIES) IMPLANT
FIBER LASER MOSES 200 DFL (Laser) IMPLANT
FIBER LASER MOSES 365 DFL (Laser) IMPLANT
GLOVE BIO SURGEON STRL SZ 6.5 (GLOVE) ×1 IMPLANT
GOWN STRL REUS W/ TWL LRG LVL3 (GOWN DISPOSABLE) ×1 IMPLANT
GUIDEWIRE STR DUAL SENSOR (WIRE) ×1 IMPLANT
KIT TURNOVER KIT A (KITS) IMPLANT
LASER FIB FLEXIVA PULSE ID 365 (Laser) IMPLANT
MANIFOLD NEPTUNE II (INSTRUMENTS) ×1 IMPLANT
PACK CYSTO (CUSTOM PROCEDURE TRAY) ×1 IMPLANT
SHEATH NAVIGATOR HD 11/13X28 (SHEATH) IMPLANT
SHEATH NAVIGATOR HD 11/13X36 (SHEATH) IMPLANT
STENT URET 6FRX26 CONTOUR (STENTS) IMPLANT
TRACTIP FLEXIVA PULS ID 200XHI (Laser) IMPLANT
TUBING CONNECTING 10 (TUBING) ×1 IMPLANT
TUBING UROLOGY SET (TUBING) ×1 IMPLANT

## 2023-03-14 NOTE — Telephone Encounter (Signed)
 Done

## 2023-03-14 NOTE — Telephone Encounter (Signed)
 Enrolled patient into PF grant through [employer]: Award Period: 09/15/2022 - 03/13/2024 Cardholder: [id number] BIN: [id number] PCN: [id number] Group: [id number] For pharmacy inquiries, contact PDMI at [phone]  Patient previously receiving through [facility] so this will help cover copay. Please run PA  Sherry Pennant, PharmD, MPH, BCPS, CPP Clinical Pharmacist (Rheumatology and Pulmonology)

## 2023-03-14 NOTE — Anesthesia Postprocedure Evaluation (Signed)
 Anesthesia Post Note  Patient: Richard Parrish  Procedure(s) Performed: CYSTOSCOPY, URETEROSCOPY, HOLMIUM LASER, STENT PLACEMENT (Right)     Patient location during evaluation: PACU Anesthesia Type: General Level of consciousness: awake and alert Pain management: pain level controlled Vital Signs Assessment: post-procedure vital signs reviewed and stable Respiratory status: spontaneous breathing, nonlabored ventilation, respiratory function stable and patient connected to nasal cannula oxygen  Cardiovascular status: blood pressure returned to baseline and stable Postop Assessment: no apparent nausea or vomiting Anesthetic complications: no   No notable events documented.  Last Vitals:  Vitals:   03/14/23 1130 03/14/23 1203  BP: (!) 161/84 (!) 165/89  Pulse: 64 (!) 55  Resp: (!) 24 (!) 21  Temp:  (!) 36.4 C  SpO2: 97% 100%    Last Pain:  Vitals:   03/14/23 1203  TempSrc:   PainSc: 4                  Thom JONELLE Peoples

## 2023-03-14 NOTE — Transfer of Care (Signed)
 Immediate Anesthesia Transfer of Care Note  Patient: Richard Parrish  Procedure(s) Performed: CYSTOSCOPY, URETEROSCOPY, HOLMIUM LASER, STENT PLACEMENT (Right)  Patient Location: PACU  Anesthesia Type:General  Level of Consciousness: awake, alert , and oriented  Airway & Oxygen  Therapy: Patient Spontanous Breathing and Patient connected to face mask oxygen   Post-op Assessment: Report given to RN and Post -op Vital signs reviewed and stable  Post vital signs: Reviewed and stable  Last Vitals:  Vitals Value Taken Time  BP 141/84 03/14/23 1038  Temp    Pulse 81 03/14/23 1040  Resp 19 03/14/23 1040  SpO2 95 % 03/14/23 1040  Vitals shown include unfiled device data.  Last Pain:  Vitals:   03/14/23 0722  TempSrc:   PainSc: 0-No pain         Complications: No notable events documented.

## 2023-03-14 NOTE — Anesthesia Procedure Notes (Signed)
 Procedure Name: LMA Insertion Date/Time: 03/14/2023 9:45 AM  Performed by: Kenidee Cregan, Corean BROCKS, CRNAPre-anesthesia Checklist: Patient identified, Emergency Drugs available, Suction available and Patient being monitored Patient Re-evaluated:Patient Re-evaluated prior to induction Oxygen  Delivery Method: Circle system utilized Preoxygenation: Pre-oxygenation with 100% oxygen  Induction Type: IV induction Ventilation: Mask ventilation without difficulty LMA: LMA inserted LMA Size: 4.0 Number of attempts: 1 Airway Equipment and Method: Bite block Placement Confirmation: positive ETCO2 Tube secured with: Tape Dental Injury: Teeth and Oropharynx as per pre-operative assessment

## 2023-03-14 NOTE — Op Note (Signed)
 Preoperative diagnosis: right ureteral calculus  Postoperative diagnosis: right ureteral calculus  Procedure:  Cystoscopy right ureteroscopy, laser lithotripsy, basket stone extraction right 32F x 26cm ureteral stent placement    Surgeon: Valli Shank, MD  Anesthesia: General  Complications: None  Intraoperative findings:  Normal urethra Bilateral lobe hypertrophy prostatic urethra Bilateral orthotropic ureteral orifices Bladder mucosa normal without masses   EBL: Minimal  Specimens: right ureteral calculus  Disposition of specimens: [facility] for stone analysis  Indication: Richard Parrish is a 73 y.o.   patient with two distal right ureteral stones and associated right symptoms. After reviewing the management options for treatment, the patient elected to proceed with the above surgical procedure(s). We have discussed the potential benefits and risks of the procedure, side effects of the proposed treatment, the likelihood of the patient achieving the goals of the procedure, and any potential problems that might occur during the procedure or recuperation. Informed consent has been obtained.   Description of procedure:  The patient was taken to the operating room and general anesthesia was induced.  The patient was placed in the dorsal lithotomy position, prepped and draped in the usual sterile fashion, and preoperative antibiotics were administered. A preoperative time-out was performed.   Cystourethroscopy was performed.  The patient's urethra was examined and was normal.   The bladder was then systematically examined in its entirety. There was no evidence for any bladder tumors, stones, or other mucosal pathology.    Attention then turned to the right ureteral orifice and the stones are seen crowning.  A 0.38 sensor guidewire was then advanced up the right ureter into the renal pelvis under fluoroscopic guidance. The 4.5 Fr semirigid ureteroscope was then  advanced into the ureter next to the guidewire and the calculus was identified.   The stones were then fragmented with the 365 micron holmium laser fiber. All stones were then removed from the ureter with a 0 tip basket.  Reinspection of the ureter revealed no remaining visible stones or fragments.   The wire was then backloaded through the cystoscope and a ureteral stent was advance over the wire using Seldinger technique.  The stent was positioned appropriately under fluoroscopic and cystoscopic guidance.  The wire was then removed with an adequate stent curl noted in the renal pelvis as well as in the bladder.  The bladder was then emptied and the procedure ended.  The patient appeared to tolerate the procedure well and without complications.  The patient was able to be awakened and transferred to the recovery unit in satisfactory condition.   Disposition: The tether of the stent was left on and secured to the ventral aspect of the patient's penis.  Instructions for removing the stent have been provided to the patient.

## 2023-03-14 NOTE — Discharge Instructions (Addendum)
 DISCHARGE INSTRUCTIONS FOR KIDNEY STONE/URETERAL STENT   MEDICATIONS:  1. Resume all your other meds from home  2. AZO over the counter can help with the burning/stinging when you urinate. 3. Tramadol  is for moderate/severe pain, otherwise taking up to 1000 mg every 6 hours of plainTylenol will help treat your pain.   4. Take Cephalexin  one hour prior to removal of your stent.    ACTIVITY:  1. No strenuous activity x 1week  2. No driving while on narcotic pain medications  3. Drink plenty of water  4. Continue to walk at home - you can still get blood clots when you are at home, so keep active, but don't over do it.  5. May return to work/school tomorrow or when you feel ready   BATHING:  1. You can shower and we recommend daily showers  2. You have a string coming from your urethra: The stent string is attached to your ureteral stent. Do not pull on this.   SIGNS/SYMPTOMS TO CALL:  Please call us  if you have a fever greater than 101.5, uncontrolled nausea/vomiting, uncontrolled pain, dizziness, unable to urinate, bloody urine, chest pain, shortness of breath, leg swelling, leg pain, redness around wound, drainage from wound, or any other concerns or questions.   You can reach us  at [phone].   FOLLOW-UP:  1. You have a string attached to your stent, you may remove it on  Friday, January 17. To do this, pull the string until the stent is completely removed. You may feel an odd sensation in your back.

## 2023-03-14 NOTE — Interval H&P Note (Signed)
 History and Physical Interval Note:  03/14/2023 7:53 AM  Richard Parrish  has presented today for surgery, with the diagnosis of RIGHT URETERAL STONE.  The various methods of treatment have been discussed with the patient and family. After consideration of risks, benefits and other options for treatment, the patient has consented to  Procedure(s) with comments: CYSTOSCOPY/ RIGHT RETROGRADE PYELOGRAM, URETEROSCOPY/HOLMIUM LASER/STENT PLACEMENT (Right) - 60 MINUTE CASE as a surgical intervention.  The patient's history has been reviewed, patient examined, no change in status, stable for surgery.  I have reviewed the patient's chart and labs.  Questions were answered to the patient's satisfaction.     [name]

## 2023-03-14 NOTE — Progress Notes (Signed)
 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 Per ARISCAT risk index he has intermediate risk with 13.3% age chance of pulmonary complication rate.  There are no obvious contraindications to surgery  Peri-operative Assessment of Pulmonary Risk for Non-Thoracic Surgery:  ForMr. Richard Parrish, risk of perioperative pulmonary complications is increased by:  Age greater than 65 years  Interstitial lung disease  Respiratory complications generally occur in 1% of ASA Class I patients, 5% of ASA Class II and 10% of ASA Class III-IV patients These complications rarely result in mortality and iclude postoperative pneumonia, atelectasis, pulmonary embolism, ARDS and increased time requiring postoperative mechanical ventilation.  Overall, I recommend proceeding with the surgery if the risk for respiratory complications are outweighed by the potential benefits. This will need to be discussed between the patient and surgeon.  To reduce risks of respiratory complications, I recommend: --Pre- and post-operative incentive spirometry performed frequently while awake --Avoiding use of pancuronium during anesthesia.  Richard Sheeler MD [facility] See Amion for pager  If no response to pager , please call [phone] until 7pm After 7:00 pm call Elink  [phone] 03/14/2023, 8:49 AM

## 2023-03-15 ENCOUNTER — Encounter (HOSPITAL_COMMUNITY): Payer: Self-pay | Admitting: Urology

## 2023-03-15 DIAGNOSIS — N201 Calculus of ureter: Secondary | ICD-10-CM | POA: Diagnosis not present

## 2023-03-15 NOTE — Progress Notes (Deleted)
  Cardiology Office Note:  .   Date:  03/15/2023  ID:  Richard Parrish, DOB December 17, 1950, MRN [id number] PCP: [name], MD  [facility] Providers Cardiologist:  Reatha Harps, MD { Click to update primary MD,subspecialty MD or APP then REFRESH:1}   History of Present Illness: .   Richard Parrish is a 73 y.o. male with history of CABG, ILD/IPF, HTN, HLD who presents for follow-up. Seen in October 2024 for syncope in setting of dehydration.      Problem List CAD -CABG x 1 (SVG-RCA) 2. PAD -L SFA/popliteal stent 2013 -50-74% L SFA 3. HLD -T chol 136, HDL 45, LDL 77, TG 71 4. HTN 5. Stage IA Lung Adenocarcinoma -s/p resection  6. ILD/IPF 7. DVT/PE    ROS: All other ROS reviewed and negative. Pertinent positives noted in the HPI.     Studies Reviewed: Marland Kitchen       Physical Exam:   VS:  There were no vitals taken for this visit.   Wt Readings from Last 3 Encounters:  03/14/23 180 lb (81.6 kg)  03/10/23 188 lb (85.3 kg)  02/07/23 183 lb (83 kg)    GEN: Well nourished, well developed in no acute distress NECK: No JVD; No carotid bruits CARDIAC: ***RRR, no murmurs, rubs, gallops RESPIRATORY:  Clear to auscultation without rales, wheezing or rhonchi  ABDOMEN: Soft, non-tender, non-distended EXTREMITIES:  No edema; No deformity  ASSESSMENT AND PLAN: .   ***    {Are you ordering a CV Procedure (e.g. stress test, cath, DCCV, TEE, etc)?   Press F2        :[id number]}   Follow-up: No follow-ups on file.  Time Spent with Patient: I have spent a total of *** minutes caring for this patient today face to face, ordering and reviewing labs/tests, reviewing prior records/medical history, examining the patient, establishing an assessment and plan, communicating results/findings to the patient/family, and documenting in the medical record.   Signed, Lenna Gilford. Flora Lipps, MD, [facility]  [facility]  [address], [address] [place], [place] [address] [phone]   8:06 PM

## 2023-03-16 ENCOUNTER — Telehealth: Payer: Self-pay | Admitting: Internal Medicine

## 2023-03-16 ENCOUNTER — Ambulatory Visit: Payer: PPO | Admitting: Cardiovascular Disease

## 2023-03-16 ENCOUNTER — Inpatient Hospital Stay: Payer: PPO | Attending: Internal Medicine

## 2023-03-16 ENCOUNTER — Encounter: Payer: Self-pay | Admitting: Medical Oncology

## 2023-03-16 DIAGNOSIS — Z7901 Long term (current) use of anticoagulants: Secondary | ICD-10-CM | POA: Insufficient documentation

## 2023-03-16 DIAGNOSIS — I25810 Atherosclerosis of coronary artery bypass graft(s) without angina pectoris: Secondary | ICD-10-CM

## 2023-03-16 DIAGNOSIS — I739 Peripheral vascular disease, unspecified: Secondary | ICD-10-CM

## 2023-03-16 DIAGNOSIS — C3490 Malignant neoplasm of unspecified part of unspecified bronchus or lung: Secondary | ICD-10-CM

## 2023-03-16 DIAGNOSIS — E782 Mixed hyperlipidemia: Secondary | ICD-10-CM

## 2023-03-16 DIAGNOSIS — C3431 Malignant neoplasm of lower lobe, right bronchus or lung: Secondary | ICD-10-CM | POA: Insufficient documentation

## 2023-03-16 DIAGNOSIS — J8410 Pulmonary fibrosis, unspecified: Secondary | ICD-10-CM | POA: Insufficient documentation

## 2023-03-16 DIAGNOSIS — I10 Essential (primary) hypertension: Secondary | ICD-10-CM

## 2023-03-16 LAB — CBC WITH DIFFERENTIAL (CANCER CENTER ONLY)
Abs Immature Granulocytes: 0.03 10*3/uL (ref 0.00–0.07)
Basophils Absolute: 0.1 10*3/uL (ref 0.0–0.1)
Basophils Relative: 1 %
Eosinophils Absolute: 0.1 10*3/uL (ref 0.0–0.5)
Eosinophils Relative: 1 %
HCT: 40.5 % (ref 39.0–52.0)
Hemoglobin: 14 g/dL (ref 13.0–17.0)
Immature Granulocytes: 0 %
Lymphocytes Relative: 4 %
Lymphs Abs: 0.4 10*3/uL — ABNORMAL LOW (ref 0.7–4.0)
MCH: 32.1 pg (ref 26.0–34.0)
MCHC: 34.6 g/dL (ref 30.0–36.0)
MCV: 92.9 fL (ref 80.0–100.0)
Monocytes Absolute: 1.3 10*3/uL — ABNORMAL HIGH (ref 0.1–1.0)
Monocytes Relative: 12 %
Neutro Abs: 8.6 10*3/uL — ABNORMAL HIGH (ref 1.7–7.7)
Neutrophils Relative %: 82 %
Platelet Count: 296 10*3/uL (ref 150–400)
RBC: 4.36 MIL/uL (ref 4.22–5.81)
RDW: 13.8 % (ref 11.5–15.5)
WBC Count: 10.5 10*3/uL (ref 4.0–10.5)
nRBC: 0 % (ref 0.0–0.2)

## 2023-03-16 LAB — CMP (CANCER CENTER ONLY)
ALT: 18 U/L (ref 0–44)
AST: 23 U/L (ref 15–41)
Albumin: 4.2 g/dL (ref 3.5–5.0)
Alkaline Phosphatase: 75 U/L (ref 38–126)
Anion gap: 7 (ref 5–15)
BUN: 15 mg/dL (ref 8–23)
CO2: 29 mmol/L (ref 22–32)
Calcium: 9.6 mg/dL (ref 8.9–10.3)
Chloride: 103 mmol/L (ref 98–111)
Creatinine: 0.9 mg/dL (ref 0.61–1.24)
GFR, Estimated: >60 mL/min (ref >60)
Glucose, Bld: 113 mg/dL — ABNORMAL HIGH (ref 70–99)
Potassium: 4.6 mmol/L (ref 3.5–5.1)
Sodium: 139 mmol/L (ref 135–145)
Total Bilirubin: 0.8 mg/dL (ref 0.0–1.2)
Total Protein: 6.7 g/dL (ref 6.5–8.1)

## 2023-03-16 NOTE — Progress Notes (Signed)
Fainted-during lab draw  He was light headed and had a " blank stare".He  "blacked out" for a few seconds  He quickly recovered.  VSS except his oxygen sat= 60 % -his oxygen tank was empty.   Jae Dire , [place] saw pt.  Pt switched to new full tank @ 5 liters and o2 improved to 99%.   Pt ambulated to lobby without problems and got in care with my assist and wife drove home.

## 2023-03-17 ENCOUNTER — Other Ambulatory Visit (HOSPITAL_COMMUNITY): Payer: Self-pay

## 2023-03-18 NOTE — Telephone Encounter (Signed)
Submitted a Prior Authorization request to [facility] ADVANTAGE/RX ADVANCE for PIRFENIDONE via CoverMyMeds. Will update once we receive a response.  Key: [id number]

## 2023-03-20 ENCOUNTER — Encounter (HOSPITAL_COMMUNITY): Payer: Self-pay

## 2023-03-20 ENCOUNTER — Ambulatory Visit (HOSPITAL_COMMUNITY)
Admission: RE | Admit: 2023-03-20 | Discharge: 2023-03-20 | Disposition: A | Discharge status: Home / Self Care | Payer: PPO | Source: Ambulatory Visit | Attending: Internal Medicine | Admitting: Internal Medicine

## 2023-03-20 ENCOUNTER — Other Ambulatory Visit: Payer: Self-pay

## 2023-03-20 DIAGNOSIS — C3490 Malignant neoplasm of unspecified part of unspecified bronchus or lung: Secondary | ICD-10-CM | POA: Diagnosis present

## 2023-03-20 MED ORDER — IOHEXOL 300 MG/ML  SOLN
75.0000 mL | Freq: Once | INTRAMUSCULAR | Status: AC | PRN
  Administered 2023-03-20: 75 mL via INTRAVENOUS

## 2023-03-20 MED ORDER — PANTOPRAZOLE SODIUM 40 MG PO TBEC: DELAYED_RELEASE_TABLET | ORAL | 2 refills | Status: DC

## 2023-03-21 ENCOUNTER — Inpatient Hospital Stay: Payer: PPO | Admitting: Nurse Practitioner

## 2023-03-21 ENCOUNTER — Inpatient Hospital Stay: Payer: PPO | Admitting: Internal Medicine

## 2023-03-24 NOTE — Progress Notes (Unsigned)
[facility] OFFICE PROGRESS NOTE  Richard Dills, MD 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 Cliffton Asters on March 03, 2021.   CURRENT THERAPY: Observation ***  INTERVAL HISTORY: Richard Parrish 73 y.o. male returns to the clinic today for follow-up visit.  The patient was last seen in the clinic by Dr. Arbutus Ped in July 2024.  The patient is followed for his history of stage I non-small cell lung cancer status post right upper lobectomy and wedge resection and right lower lobe lymph node dissection under the care of Dr. Cliffton Asters which was performed in January 2023.  The patient is followed every 6 months with routine restaging CT scans.  The patient does have pulmonary fibrosis and follows closely with pulmonary medicine.  In the interval since last being seen he was hospitalized in November 2024 for respiratory failure secondary to pulmonary fibrosis and pulmonary embolism.  The patient is currently on a blood thinner with ***and is compliant.  He tolerates this well without any abnormal bleeding or bruising.  Today he denies any fever, chills, night sweats, or unexplained weight loss.  His shortness of breath is ***.  Supplemental oxygen?  His cough is ***.  Denies any chest pain or hemoptysis.  Denies any nausea, vomiting, diarrhea, or constipation.  Denies any headache or visual changes.  He recently had a restaging CT scan.  He is here today for evaluation to review his scan results.  MEDICAL HISTORY: Past Medical History:  Diagnosis Date   Anxiety    Arthritis    "lower back" (04/07/2017)   BPH (benign prostatic hypertrophy)    C. difficile diarrhea     Chronic lower back pain    Coronary artery disease 1989   cabg with dvg to rca    Depression    GERD (gastroesophageal reflux disease)    History of kidney stones    Hyperlipidemia    Hypertension    Insomnia    Lung cancer (HCC)    Myocardial infarction (HCC) 2002   On home oxygen therapy    OSA on CPAP    cpap setting of 60 per pt   Osteoarthritis    "hips" (04/07/2017)   PAD (peripheral artery disease) (HCC)    Peripheral vascular disease (HCC)    Pneumonia 1989   S/P CABG   Pulmonary embolism (HCC) 05/18/2021   Pulmonary fibrosis (HCC)    "one time" (04/07/2017)   Spinal stenosis     ALLERGIES:  is allergic to [employer] tartrate] and metoprolol.  MEDICATIONS:  Current Outpatient Medications  Medication Sig Dispense Refill   acetaminophen (TYLENOL) 500 MG tablet Take 1 tablet (500 mg total) by mouth every 6 (six) hours as needed for mild pain.     ALPRAZolam (XANAX) 0.5 MG tablet Take 0.5-1 tablets (0.25-0.5 mg total) by mouth 3 (three) times daily as needed for anxiety or sleep. (Patient taking differently: Take 0.5 mg by mouth 4 (four) times daily as needed for anxiety or sleep.) 5 tablet 0   antiseptic oral rinse (BIOTENE) LIQD 15 mLs by Mouth Rinse route daily.     arformoterol (BROVANA) 15 MCG/2ML NEBU Take 2 mLs (15 mcg total) by nebulization  2 (two) times daily. (Patient not taking: Reported on 02/07/2023) 120 mL 1   atorvastatin (LIPITOR) 80 MG tablet Take 1 tablet (80 mg total) by mouth daily. 90 tablet 3   budesonide (PULMICORT) 0.5 MG/2ML nebulizer solution Take 2 mLs (0.5 mg total) by nebulization daily. (Patient not taking: Reported on 03/07/2023) 60 mL 1   carvedilol (COREG) 6.25 MG tablet Take 1 tablet (6.25 mg total) by mouth 2 (two) times daily. 180 tablet 3   cholecalciferol (VITAMIN D3) 25 MCG (1000 UNIT) tablet Take 1,000 Units by mouth 3 (three) times a week.     diphenhydrAMINE (BENADRYL) 25 mg capsule Take 50 mg by mouth daily as needed (sinus  drainage).     ELIQUIS 5 MG TABS tablet TAKE 1 TABLET BY MOUTH TWICE A DAY 180 tablet 1   guaiFENesin (MUCINEX) 600 MG 12 hr tablet Take 1 tablet (600 mg total) by mouth 2 (two) times daily as needed. (Patient taking differently: Take 600 mg by mouth 2 (two) times daily as needed for to loosen phlegm.)     HYDROcodone-acetaminophen (NORCO/VICODIN) 5-325 MG tablet Take 1 tablet by mouth 5 (five) times daily.     hyoscyamine (LEVSIN, ANASPAZ) 0.125 MG tablet Take 0.125 mg by mouth every 4 (four) hours as needed for cramping.      nitroGLYCERIN (NITROSTAT) 0.4 MG SL tablet Place 1 tablet (0.4 mg total) under the tongue every 5 (five) minutes as needed for chest pain. 25 tablet 8   OXYGEN Inhale 5 L/min into the lungs continuous.     pantoprazole (PROTONIX) 40 MG tablet TAKE 1 TABLET BY MOUTH EVERY DAY BEFORE BREAKFAST 90 tablet 2   PARoxetine (PAXIL) 20 MG tablet Take 20 mg by mouth daily.     Pirfenidone 801 MG TABS Take 1 tablet (801 mg total) by mouth 3 (three) times daily with meals. ([email or web address]) 90 tablet 5   sodium chloride (OCEAN) 0.65 % SOLN nasal spray Place 1 spray into both nostrils daily as needed for congestion.     tamsulosin (FLOMAX) 0.4 MG CAPS capsule Take 0.4 mg by mouth in the morning and at bedtime.     traMADol (ULTRAM) 50 MG tablet Take 1 tablet (50 mg total) by mouth every 6 (six) hours as needed. 15 tablet 0   triamcinolone cream (KENALOG) 0.1 % Apply 1 Application topically 2 (two) times daily as needed (for itching).     No current facility-administered medications for this visit.    SURGICAL HISTORY:  Past Surgical History:  Procedure Laterality Date   ABDOMINAL AORTAGRAM N/A 03/15/2011   Procedure: ABDOMINAL AORTAGRAM;  Surgeon: Nada Libman, MD;  Location: [facility] CATH LAB;  Service: Cardiovascular;  Laterality: N/A;   ABDOMINAL AORTAGRAM N/A 04/12/2011   Procedure: ABDOMINAL Ronny Flurry;  Surgeon: Nada Libman, MD;  Location: [facility] CATH LAB;  Service:  Cardiovascular;  Laterality: N/A;   BACK SURGERY     BRONCHIAL BIOPSY  03/03/2021   Procedure: BRONCHIAL BIOPSIES;  Surgeon: Josephine Igo, DO;  Location: [facility] ENDOSCOPY;  Service: Pulmonary;;   BRONCHIAL BRUSHINGS  03/03/2021   Procedure: BRONCHIAL BRUSHINGS;  Surgeon: Josephine Igo, DO;  Location: [facility] ENDOSCOPY;  Service: Pulmonary;;   BRONCHIAL NEEDLE ASPIRATION BIOPSY  03/03/2021   Procedure: BRONCHIAL NEEDLE ASPIRATION BIOPSIES;  Surgeon: Josephine Igo, DO;  Location: [facility] ENDOSCOPY;  Service: Pulmonary;;   CARDIAC CATHETERIZATION  08/1987   CORONARY ANGIOPLASTY WITH STENT PLACEMENT  ~ 2002   [facility], [place]   CORONARY ARTERY BYPASS  GRAFT  09/1987   "CABG X1";  [facility] [place]; "RCA"   CYSTOSCOPY/URETEROSCOPY/HOLMIUM LASER/STENT PLACEMENT Right 03/14/2023   Procedure: CYSTOSCOPY, URETEROSCOPY, HOLMIUM LASER, STENT PLACEMENT;  Surgeon: Noel Christmas, MD;  Location: [facility] ORS;  Service: Urology;  Laterality: Right;  60 MINUTE CASE   FIDUCIAL MARKER PLACEMENT  03/03/2021   Procedure: FIDUCIAL DYE MARKING;  Surgeon: Josephine Igo, DO;  Location: [facility] ENDOSCOPY;  Service: Pulmonary;;   INTERCOSTAL NERVE BLOCK  03/03/2021   Procedure: INTERCOSTAL NERVE BLOCK;  Surgeon: Corliss Skains, MD;  Location: [facility] OR;  Service: Thoracic;;   JOINT REPLACEMENT     KNEE ARTHROSCOPY Left    LAPAROSCOPIC CHOLECYSTECTOMY     LYMPH NODE BIOPSY  03/03/2021   Procedure: LYMPH NODE BIOPSY;  Surgeon: Corliss Skains, MD;  Location: [facility] OR;  Service: Thoracic;;   LYMPH NODE DISSECTION  03/03/2021   Procedure: LYMPH NODE DISSECTION;  Surgeon: Corliss Skains, MD;  Location: [facility] OR;  Service: Thoracic;;   PERIPHERAL VASCULAR INTERVENTION Left 2016   "put a stent; right branch of left femoral"   POSTERIOR LUMBAR FUSION  2009   lumbar fusion, S L 5 to L 4   RIGHT/LEFT HEART CATH AND CORONARY/GRAFT ANGIOGRAPHY N/A 10/05/2020   Procedure: RIGHT/LEFT HEART CATH AND CORONARY/GRAFT  ANGIOGRAPHY;  Surgeon: Yvonne Kendall, MD;  Location: [facility] INVASIVE CV LAB;  Service: Cardiovascular;  Laterality: N/A;   SHOULDER ARTHROSCOPY W/ ROTATOR CUFF REPAIR Right 2006   SHOULDER ARTHROSCOPY WITH SUBACROMIAL DECOMPRESSION AND OPEN ROTATOR C Left ~ 1983   TONSILLECTOMY AND ADENOIDECTOMY     TOTAL HIP ARTHROPLASTY Right 2012   TOTAL HIP ARTHROPLASTY Left 04/07/2017   TOTAL HIP ARTHROPLASTY Left 04/07/2017   Procedure: LEFT TOTAL HIP ARTHROPLASTY;  Surgeon: Frederico Hamman, MD;  Location: [facility] OR;  Service: Orthopedics;  Laterality: Left;   VIDEO BRONCHOSCOPY WITH RADIAL ENDOBRONCHIAL ULTRASOUND  03/03/2021   Procedure: VIDEO BRONCHOSCOPY WITH RADIAL ENDOBRONCHIAL ULTRASOUND;  Surgeon: Josephine Igo, DO;  Location: [facility];  Service: Pulmonary;;    REVIEW OF SYSTEMS:   Review of Systems  Constitutional: Negative for appetite change, chills, fatigue, fever and unexpected weight change.  HENT:   Negative for mouth sores, nosebleeds, sore throat and trouble swallowing.   Eyes: Negative for eye problems and icterus.  Respiratory: Negative for cough, hemoptysis, shortness of breath and wheezing.   Cardiovascular: Negative for chest pain and leg swelling.  Gastrointestinal: Negative for abdominal pain, constipation, diarrhea, nausea and vomiting.  Genitourinary: Negative for bladder incontinence, difficulty urinating, dysuria, frequency and hematuria.   Musculoskeletal: Negative for back pain, gait problem, neck pain and neck stiffness.  Skin: Negative for itching and rash.  Neurological: Negative for dizziness, extremity weakness, gait problem, headaches, light-headedness and seizures.  Hematological: Negative for adenopathy. Does not bruise/bleed easily.  Psychiatric/Behavioral: Negative for confusion, depression and sleep disturbance. The patient is not nervous/anxious.     PHYSICAL EXAMINATION:  There were no vitals taken for this visit.  ECOG PERFORMANCE STATUS: {CHL ONC ECOG  [id number]  Physical Exam  Constitutional: Oriented to person, place, and time and well-developed, well-nourished, and in no distress. No distress.  HENT:  Head: Normocephalic and atraumatic.  Mouth/Throat: Oropharynx is clear and moist. No oropharyngeal exudate.  Eyes: Conjunctivae are normal. Right eye exhibits no discharge. Left eye exhibits no discharge. No scleral icterus.  Neck: Normal range of motion. Neck supple.  Cardiovascular: Normal rate, regular rhythm, normal heart sounds and intact distal pulses.   Pulmonary/Chest:  Effort normal and breath sounds normal. No respiratory distress. No wheezes. No rales.  Abdominal: Soft. Bowel sounds are normal. Exhibits no distension and no mass. There is no tenderness.  Musculoskeletal: Normal range of motion. Exhibits no edema.  Lymphadenopathy:    No cervical adenopathy.  Neurological: Alert and oriented to person, place, and time. Exhibits normal muscle tone. Gait normal. Coordination normal.  Skin: Skin is warm and dry. No rash noted. Not diaphoretic. No erythema. No pallor.  Psychiatric: Mood, memory and judgment normal.  Vitals reviewed.  LABORATORY DATA: Lab Results  Component Value Date   WBC 10.5 03/16/2023   HGB 14.0 03/16/2023   HCT 40.5 03/16/2023   MCV 92.9 03/16/2023   PLT 296 03/16/2023      Chemistry      Component Value Date/Time   NA 139 03/16/2023 0905   NA 140 12/01/2022 1544   K 4.6 03/16/2023 0905   CL 103 03/16/2023 0905   CO2 29 03/16/2023 0905   BUN 15 03/16/2023 0905   BUN 17 12/01/2022 1544   CREATININE 0.90 03/16/2023 0905      Component Value Date/Time   CALCIUM 9.6 03/16/2023 0905   ALKPHOS 75 03/16/2023 0905   AST 23 03/16/2023 0905   ALT 18 03/16/2023 0905   BILITOT 0.8 03/16/2023 0905       RADIOGRAPHIC STUDIES:  DG C-Arm 1-60 Min-No Report Result Date: 03/14/2023 Fluoroscopy was utilized by the requesting physician.  No radiographic interpretation.     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  He was diagnosed January 2023.  He is status post right upper lobectomy as well as wedge resection of the right lower lobe with lymph node dissection under the care of Dr. Cliffton Asters on 03/03/2021.  The patient recently had a restaging CT scan.  The patient was seen with Dr. Arbutus Ped today.  Dr. Arbutus Ped personally and independently reviewed the scan and discussed the results with the patient today.  The scan showed ***.  Dr. Arbutus Ped recommends that the patient continue on observation with a repeat CT scan the chest in 1 year.  We will see him back for follow-up visit 1 week after his restaging CT scan to review the results.  The patient will continue taking his Eliquis for his pulmonary embolism.  He will continue to follow with pulmonary medicine regarding his pulmonary fibrosis.  The patient was advised to call immediately if he has any concerning symptoms in the interval. The patient voices understanding of current disease status and treatment options and is in agreement with the current care plan. All questions were answered. The patient knows to call the clinic with any problems, questions or concerns. We can certainly see the patient much sooner if necessary   No orders of the defined types were placed in this encounter.    I spent {CHL ONC TIME VISIT - MVHQI:[id number]} counseling the patient face to face. The total time spent in the appointment was {CHL ONC TIME VISIT - LKGMW:[id number]}.  Edinson Domeier L Epsie Walthall, PA-C 03/24/23

## 2023-03-27 ENCOUNTER — Inpatient Hospital Stay: Payer: PPO | Admitting: Physician Assistant

## 2023-03-27 ENCOUNTER — Other Ambulatory Visit: Payer: Self-pay

## 2023-03-27 VITALS — BP 118/76 | HR 75 | Temp 98.4°F | Resp 16 | Wt 193.0 lb

## 2023-03-27 DIAGNOSIS — C3490 Malignant neoplasm of unspecified part of unspecified bronchus or lung: Secondary | ICD-10-CM | POA: Diagnosis not present

## 2023-03-27 DIAGNOSIS — C3411 Malignant neoplasm of upper lobe, right bronchus or lung: Secondary | ICD-10-CM

## 2023-03-27 DIAGNOSIS — C3431 Malignant neoplasm of lower lobe, right bronchus or lung: Secondary | ICD-10-CM | POA: Diagnosis not present

## 2023-03-29 NOTE — Telephone Encounter (Signed)
Received notification from [facility] ADVANTAGE/RX ADVANCE regarding a prior authorization for PIRFENIDONE. Authorization has been APPROVED from 03/19/2023 to 03/18/2024. Approval letter sent to scan center.  Patient can fill through [facility]: [phone]   Authorization # [id number]  Chesley Mires, PharmD, MPH, BCPS, CPP Clinical Pharmacist (Rheumatology and Pulmonology)

## 2023-03-30 MED ORDER — PIRFENIDONE 801 MG PO TABS
1.0000 | ORAL_TABLET | Freq: Three times a day (TID) | ORAL | 0 refills | Status: DC
  Filled 2023-04-19: qty 90, 30d supply, fill #0
  Filled 2023-06-07 – 2023-06-09 (×2): qty 90, 30d supply, fill #1
  Filled 2023-06-30: qty 90, 30d supply, fill #2

## 2023-03-30 NOTE — Telephone Encounter (Signed)
Spoke with patient's wife - advised of grant. She is okay with going back to [facility] for now and utilizing grant. Reviewed that insurance would be billed and that grant would pick up remaining copay.  Patient is receiving a month supply today. So he will not need medication until the end of the month.  Confirmed pirfenidone dosing with wife.  Chesley Mires, PharmD, MPH, BCPS, CPP Clinical Pharmacist (Rheumatology and Pulmonology)

## 2023-03-31 ENCOUNTER — Other Ambulatory Visit (HOSPITAL_COMMUNITY): Payer: Self-pay

## 2023-03-31 ENCOUNTER — Other Ambulatory Visit: Payer: Self-pay

## 2023-03-31 NOTE — Progress Notes (Signed)
Patient nenrolled into IPF grant. Was previously receiving through [facility]  Continue pirfenidone 801mg  three times daily LFTs to continue to be monitored every 3 months  Chesley Mires, PharmD, MPH, BCPS, CPP Clinical Pharmacist (Rheumatology and Pulmonology)

## 2023-04-02 NOTE — Progress Notes (Signed)
 Cardiology Office Note:  .   Date:  04/05/2023  ID:  Richard Parrish, DOB Oct 28, 1950, MRN [id number] PCP: Rexanne Ingle, MD  [facility] Providers Cardiologist:  Darryle ONEIDA Decent, MD { History of Present Illness: .   Richard Parrish is a 73 y.o. male with history of PAD, CAD, HLD, who presents for follow-up.   History of Present Illness   Richard Parrish is a 73 year old male with PAD, CAD, interstitial lung disease, and idiopathic pulmonary fibrosis who presents for follow-up.   His lung condition is stable with possible slight improvement. He is increasing his physical activity, testing his limits daily, but experiences shortness of breath with excessive or rapid walking. No chest pain is reported.  He has a persistent rash on his arms and legs, initially thought to be related to metoprolol , which was discontinued. The rash is less itchy currently, possibly due to reduced sun exposure.  He has a history of PAD with a left SFA popliteal stent placed in 2013 and moderate stenosis in the left SFA, but he reports no symptoms of claudication.  He has CAD status post CABG times one and reports no symptoms of angina. Blood pressure is well controlled on carvedilol .  He recalls an episode of dizziness recently but states it is not a regular occurrence. His blood pressure is well controlled, and he is not experiencing dizziness or lightheadedness regularly.  He is currently on pirfenidone  for his lung condition, which he tolerates despite the rash. He is also on carvedilol  3.125 mg BID for blood pressure management, and Lipitor  80 mg for hyperlipidemia, with a recent LDL cholesterol level of 71. He is on Eliquis  due to a history of DVT and PE and is not on aspirin  because of this.           Problem List CAD -CABG x 1 (SVG-RCA) 2. PAD -L SFA/popliteal stent 2013 -50-74% L SFA 3. HLD -T chol 134, HDL 49, LDL 71, TG 70 4. HTN 5. Stage IA Lung Adenocarcinoma -s/p resection  6.  ILD/IPF 7. DVT/PE    ROS: All other ROS reviewed and negative. Pertinent positives noted in the HPI.     Studies Reviewed: SABRA       Vasc US  12/22/2022 Summary:  Left: 50-74% stenosis noted in the deep femoral artery. Patent stent with  no evidence of stenosis in the superficial femoral artery.   TTE 05/19/2021  1. Left ventricular ejection fraction, by estimation, is 60 to 65%. The  left ventricle has normal function. The left ventricle has no regional  wall motion abnormalities. Left ventricular diastolic parameters are  indeterminate.   2. Right ventricular systolic function is normal. The right ventricular  size is mildly enlarged. Tricuspid regurgitation signal is inadequate for  assessing PA pressure.   3. A small pericardial effusion is present. There is no evidence of  cardiac tamponade.   4. The mitral valve is grossly normal. No evidence of mitral valve  regurgitation. No evidence of mitral stenosis.   5. The aortic valve is tricuspid. Aortic valve regurgitation is not  visualized. No aortic stenosis is present.  Physical Exam:   VS:  BP 106/66 (BP Location: Right Arm, Patient Position: Sitting, Cuff Size: Normal)   Pulse 74   Ht 6' (1.829 m)   Wt 191 lb (86.6 kg)   BMI 25.90 kg/m    Wt Readings from Last 3 Encounters:  04/05/23 191 lb (86.6 kg)  03/27/23 193 lb (87.5  kg)  03/14/23 180 lb (81.6 kg)    GEN: Well nourished, well developed in no acute distress NECK: No JVD; No carotid bruits CARDIAC: RRR, no murmurs, rubs, gallops RESPIRATORY:  Clear to auscultation without rales, wheezing or rhonchi  ABDOMEN: Soft, non-tender, non-distended EXTREMITIES:  No edema; No deformity  ASSESSMENT AND PLAN: .   Assessment and Plan    Peripheral Artery Disease (PAD) Asymptomatic with moderate stenosis in left SFA. -Continue current management with vascular surgery follow-up.  Coronary Artery Disease (CAD) s/p CABG x 1 Asymptomatic. -Continue Carvedilol  6.25mg   BID. -on eliquis . No ASA  Hyperlipidemia LDL 71, at goal. -Continue Atorvastatin  80mg  daily.  Hypertension Well controlled, BP 106/60. -Continue Carvedilol  6.25mg  BID.  Nicotine Dependence Still vaping. -Encouraged to reduce and eventually quit vaping due to vascular risks.  Follow-up in 1 year or sooner if needed.              Follow-up: Return in about 1 year (around 04/04/2024).   Signed, Darryle DASEN. Barbaraann, MD, [facility]  [facility]  [address], [address] [place], [place] [address] [phone]  11:24 AM

## 2023-04-05 ENCOUNTER — Ambulatory Visit: Payer: PPO | Attending: Cardiovascular Disease | Admitting: Cardiovascular Disease

## 2023-04-05 ENCOUNTER — Encounter: Payer: Self-pay | Admitting: Cardiovascular Disease

## 2023-04-05 VITALS — BP 106/66 | HR 74 | Ht 72.0 in | Wt 191.0 lb

## 2023-04-05 DIAGNOSIS — I739 Peripheral vascular disease, unspecified: Secondary | ICD-10-CM | POA: Diagnosis not present

## 2023-04-05 DIAGNOSIS — I10 Essential (primary) hypertension: Secondary | ICD-10-CM | POA: Diagnosis not present

## 2023-04-05 DIAGNOSIS — I25810 Atherosclerosis of coronary artery bypass graft(s) without angina pectoris: Secondary | ICD-10-CM | POA: Diagnosis not present

## 2023-04-05 DIAGNOSIS — E782 Mixed hyperlipidemia: Secondary | ICD-10-CM | POA: Diagnosis not present

## 2023-04-05 NOTE — Patient Instructions (Signed)
 Medication Instructions:  No changes  *If you need a refill on your cardiac medications before your next appointment, please call your pharmacy*   Lab Work:  Not needed   Testing/Procedures:  Not needed  Follow-Up: At [facility], you and your health needs are our priority.  As part of our continuing mission to provide you with exceptional heart care, we have created designated Provider Care Teams.  These Care Teams include your primary Cardiologist (physician) and Advanced Practice Providers (APPs -  Physician Assistants and Nurse Practitioners) who all work together to provide you with the care you need, when you need it.     Your next appointment:   12 month(s)  The format for your next appointment:   In Person  Provider:   Darryle ONEIDA Decent, MD    Other Instruction   Recommend you stop Vapeing

## 2023-04-17 ENCOUNTER — Other Ambulatory Visit: Payer: Self-pay

## 2023-04-19 ENCOUNTER — Other Ambulatory Visit (HOSPITAL_COMMUNITY): Payer: Self-pay

## 2023-04-19 ENCOUNTER — Other Ambulatory Visit: Payer: Self-pay

## 2023-04-19 NOTE — Progress Notes (Signed)
 Specialty Pharmacy Initial Fill Coordination Note  Richard Parrish is a 73 y.o. male contacted today regarding initial fill of specialty medication(s) Pirfenidone   Patient requested Delivery   Delivery date: 04/25/23   Verified address: [address]   [place] [address]   Medication will be filled on 04/24/23.   Patient has grant on file and is aware of $0 copayment.

## 2023-04-20 ENCOUNTER — Other Ambulatory Visit: Payer: Self-pay | Admitting: Pulmonary Disease

## 2023-05-03 ENCOUNTER — Ambulatory Visit

## 2023-05-03 ENCOUNTER — Ambulatory Visit: Payer: PPO | Admitting: Pulmonary Disease

## 2023-05-03 ENCOUNTER — Telehealth: Payer: Self-pay | Admitting: Pulmonary Disease

## 2023-05-03 ENCOUNTER — Encounter: Payer: Self-pay | Admitting: Pulmonary Disease

## 2023-05-03 VITALS — BP 108/62 | HR 84 | Ht 72.0 in | Wt 183.0 lb

## 2023-05-03 DIAGNOSIS — I2699 Other pulmonary embolism without acute cor pulmonale: Secondary | ICD-10-CM | POA: Diagnosis not present

## 2023-05-03 DIAGNOSIS — J84112 Idiopathic pulmonary fibrosis: Secondary | ICD-10-CM | POA: Diagnosis not present

## 2023-05-03 DIAGNOSIS — Z5181 Encounter for therapeutic drug level monitoring: Secondary | ICD-10-CM | POA: Diagnosis not present

## 2023-05-03 DIAGNOSIS — R197 Diarrhea, unspecified: Secondary | ICD-10-CM | POA: Diagnosis not present

## 2023-05-03 DIAGNOSIS — J939 Pneumothorax, unspecified: Secondary | ICD-10-CM | POA: Diagnosis not present

## 2023-05-03 LAB — COMPREHENSIVE METABOLIC PANEL
ALT: 18 U/L (ref 0–53)
AST: 27 U/L (ref 0–37)
Albumin: 4.6 g/dL (ref 3.5–5.2)
Alkaline Phosphatase: 86 U/L (ref 39–117)
BUN: 23 mg/dL (ref 6–23)
CO2: 25 meq/L (ref 19–32)
Calcium: 9.5 mg/dL (ref 8.4–10.5)
Chloride: 102 meq/L (ref 96–112)
Creatinine, Ser: 0.96 mg/dL (ref 0.40–1.50)
GFR: 78.11 mL/min (ref >60.00)
Glucose, Bld: 124 mg/dL — ABNORMAL HIGH (ref 70–99)
Potassium: 4.3 meq/L (ref 3.5–5.1)
Sodium: 137 meq/L (ref 135–145)
Total Bilirubin: 0.8 mg/dL (ref 0.2–1.2)
Total Protein: 7.2 g/dL (ref 6.0–8.3)

## 2023-05-03 MED ORDER — AZITHROMYCIN 250 MG PO TABS: 250.0000 mg | ORAL_TABLET | Freq: Every day | ORAL | 0 refills | Status: DC

## 2023-05-03 MED ORDER — PREDNISONE 10 MG PO TABS: 60.0000 mg | ORAL_TABLET | Freq: Every day | ORAL | 0 refills | Status: DC

## 2023-05-03 NOTE — Patient Instructions (Signed)
 VISIT SUMMARY:  Today, you were seen for diarrhea and general malaise. We discussed your ongoing issues with pulmonary fibrosis, increased shortness of breath, and digital cyanosis, which may be related to Raynaud's phenomenon. You also reported muscle soreness and a recent episode of diarrhea.  YOUR PLAN:  -IDIOPATHIC PULMONARY FIBROSIS: Idiopathic Pulmonary Fibrosis is a chronic lung disease that causes scarring of the lungs, leading to difficulty breathing. We will hold your Esbriet medication for a week to see if your symptoms improve. Please stay hydrated with electrolyte solutions. If lung inflammation is suspected, we may prescribe azithromycin and prednisone. Seek emergency care if your symptoms worsen.  -DIARRHEA: Diarrhea is the frequent passage of loose, watery stools. We recommend you stay hydrated with electrolyte solutions and continue taking loperamide to manage the diarrhea. If lung inflammation is suspected, we may prescribe azithromycin and prednisone. Seek emergency care if your symptoms worsen.  -RAYNAUD'S PHENOMENON: Raynaud's Phenomenon is a condition where cold temperatures or stress cause blood vessel spasms, leading to reduced blood flow, usually to the fingers and toes. Ensure you stay warm to prevent exacerbation of symptoms.  -MUSCLE SORENESS: Muscle soreness can occur due to physical activity. We recommend light muscle conditioning exercises without overexertion.  -GENERAL HEALTH MAINTENANCE: We have not done recent labs since January. It would be beneficial to check your blood counts due to your current symptoms.  INSTRUCTIONS:  Please follow up with the lab tests to check your blood counts. Seek emergency care if your symptoms worsen.  For more information, you can read your full clinical note, available in your patient portal.

## 2023-05-03 NOTE — Progress Notes (Signed)
 Richard Parrish    [id number]    [birth year]  Primary Care Physician:Polite, Windy Fast, MD  Referring Physician: [name], MD 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  with history of pulmonary fibrosis, GERD, hypertension, hyperlipidemia, pulmonary fibrosis He was diagnosed with unspecified pulmonary fibrosis and was seen in pulmonary clinic in 2015  Developed COVID-19 in March 2021 and reports worsening dyspnea since then.  Complains of chronic dyspnea on exertion, cough.  No fevers or chills  Imaging showed right upper lobe mass and he underwent combined bronchoscopy with Dr. Tonia Parrish and subsequently right upper lobe robotic resection by Dr. Cliffton Parrish on 03/03/2021.  Saw Dr. Arbutus Parrish from oncology on 03/24/2021 and no further chemotherapy recommended  Postsurgery he had recurrent issues pneumothorax and mildly exacerbation Treated in Jan with levofloxacin and prednisone. He had needle decompression and chest tube placed with Heimlich valve and mini VAC for pneumothorax.  Hospitalized on 2/26 under the care of cardiothoracic surgery.  Discharged on amoxicillin and additional prednisone taper.  Hospitalized in March 2023 with submassive pulmonary embolism, left lower extremity DVT.  Started on Eliquis.  He was also started on prednisone for ILD exacerbation Started Esbriet in May 2023.  So far is tolerating it well without any problem. He has tapered off prednisone in September 2023 Finished pulmonary rehab in September 2023.  Developed photosensitivity rash to Esbriet in 2024.  He took a break from the medication for a week and resumed it in early July 2024 .  Overall he feels that the rash is improving and has consulted with a dermatologist.  Pets:  3 cats Occupation: Used to work in Patent examiner at [employer]: Exposed to [employer] carrier. Smoking history: 30-pack-year smoker.  Quit in 1989.  Currently vapes which helps with his back. Travel history: No significant recent travel history Relevant family history: No family history of lung disease  Interim history: Discussed the use of AI scribe software for clinical note transcription with the patient, who gave verbal consent to proceed.  Richard Parrish "Richard Parrish" is a 73 year old male with pulmonary fibrosis who presents with diarrhea and general malaise.  He has a history of pulmonary fibrosis and is currently on Esbriet. He is experiencing increased shortness of breath and notes cyanosis of his fingers, which he associates with Raynaud's phenomenon, especially in cold weather. He is on 5 liters of oxygen, and his oxygen saturation is 94%. He has a persistent cough, described as 'always coughing to get something clear.'  He reports a sudden onset of feeling unwell, with symptoms beginning after feeling well on Monday. He spent most of Tuesday in bed. He experienced significant diarrhea last night and this morning and has taken Imodium to manage it. He also reports chills and muscular discomfort, particularly in his back, neck, and shoulders, which he attributes to possible physical activity. No fever or chills are present currently.  He has a history of colitis, which occasionally flares up, though it has been a while since the last episode. He mentions recent weight fluctuations.    Outpatient Encounter Medications as of 05/03/2023  Medication Sig   acetaminophen (TYLENOL) 500 MG tablet Take 1 tablet (500 mg total) by mouth every 6 (six) hours as needed  for mild pain.   ALPRAZolam (XANAX) 0.5 MG tablet Take 0.5-1 tablets (0.25-0.5 mg total) by mouth 3 (three) times daily as needed for anxiety or sleep. (Patient taking differently: Take 0.5 mg by mouth 4 (four) times daily  as needed for anxiety or sleep.)   antiseptic oral rinse (BIOTENE) LIQD 15 mLs by Mouth Rinse route daily.   atorvastatin (LIPITOR) 80 MG tablet Take 1 tablet (80 mg total) by mouth daily.   budesonide (ENTOCORT EC) 3 MG 24 hr capsule Take 3 mg by mouth daily.   carvedilol (COREG) 6.25 MG tablet Take 1 tablet (6.25 mg total) by mouth 2 (two) times daily.   cholecalciferol (VITAMIN D3) 25 MCG (1000 UNIT) tablet Take 1,000 Units by mouth 3 (three) times a week.   diphenhydrAMINE (BENADRYL) 25 mg capsule Take 50 mg by mouth daily as needed (sinus drainage).   ELIQUIS 5 MG TABS tablet TAKE 1 TABLET BY MOUTH TWICE A DAY   guaiFENesin (MUCINEX) 600 MG 12 hr tablet Take 1 tablet (600 mg total) by mouth 2 (two) times daily as needed. (Patient taking differently: Take 600 mg by mouth 2 (two) times daily as needed for to loosen phlegm.)   HYDROcodone-acetaminophen (NORCO/VICODIN) 5-325 MG tablet Take 1 tablet by mouth 5 (five) times daily.   hyoscyamine (LEVSIN, ANASPAZ) 0.125 MG tablet Take 0.125 mg by mouth every 4 (four) hours as needed for cramping.    methocarbamol (ROBAXIN) 750 MG tablet Take 750 mg by mouth every 8 (eight) hours as needed.   nitroGLYCERIN (NITROSTAT) 0.4 MG SL tablet Place 1 tablet (0.4 mg total) under the tongue every 5 (five) minutes as needed for chest pain.   OXYGEN Inhale 5 L/min into the lungs continuous.   pantoprazole (PROTONIX) 40 MG tablet TAKE 1 TABLET BY MOUTH EVERY DAY BEFORE BREAKFAST   PARoxetine (PAXIL) 20 MG tablet Take 20 mg by mouth daily.   Pirfenidone 801 MG TABS Take 1 tablet (801 mg total) by mouth 3 (three) times daily with meals.   sodium chloride (OCEAN) 0.65 % SOLN nasal spray Place 1 spray into both nostrils daily as needed for congestion.   tamsulosin (FLOMAX) 0.4 MG CAPS capsule Take 0.4 mg by mouth in the morning and at bedtime.   traMADol (ULTRAM) 50 MG tablet Take 1 tablet (50 mg total) by mouth every 6 (six) hours as needed.   triamcinolone cream  (KENALOG) 0.1 % Apply 1 Application topically 2 (two) times daily as needed (for itching).   budesonide (PULMICORT) 0.5 MG/2ML nebulizer solution Take 2 mLs (0.5 mg total) by nebulization daily.   No facility-administered encounter medications on file as of 05/03/2023.    Physical Exam: Blood pressure 108/62, pulse 84, height 6' (1.829 m), weight 183 lb (83 kg), SpO2 94%. Gen:      No acute distress HEENT:  EOMI, sclera anicteric Neck:     No masses; no thyromegaly Lungs:    Clear to auscultation bilaterally; normal respiratory effort CV:         Regular rate and rhythm; no murmurs Abd:      + bowel sounds; soft, non-tender; no palpable masses, no distension Ext:    No edema; adequate peripheral perfusion Skin:      Warm and dry; no rash Neuro: alert and oriented x 3 Psych: normal mood and affect   Data Reviewed: Imaging: CT chest 07/18/2013-interstitial fibrosis with traction bronchiectasis with basilar predominance, emphysema.  Probable UIP pattern CT abdomen pelvis 02/07/17- basal pulmonary fibrosis. CT  high-res 12/18/2020-new spiculated lung nodule in the right upper lobe PET scan 01/05/2021-uptake in the right upper lobe, equivocal uptake in the right hilum.  Left sphenoid sinus disease. Chest x-ray 04/01/2021-dense right lower lobe infiltrate CTA 04/13/2021-no PE, large right pneumothorax CTA 05/18/2021-left-sided pulmonary embolism, worsening groundglass opacities, airspace disease-right greater than left. High-resolution CT 07/13/21 - resolution of consolidation and asbestos disease. Worsening pattern of pulmonary fibrosis in UIP pattern CT chest 09/17/2021-Parrish pattern of pulmonary fibrosis CT chest 03/17/2022-Parrish findings of pulmonary fibrosis. CT chest 09/12/2022-Parrish findings of pulmonary fibrosis. CT high-resolution 01/19/2023-patchy consolidation, groundglass opacities throughout posterior left upper lobe, left lower lobe, baseline extensive pulmonary fibrosis. I have  reviewed the images personally  PFTs: 11/27/2020 FVC 4.03 [85%], FEV1 2.95 [84%], F/F 73, TLC 6.09 [81%], DLCO 20.01 [73%] Minimal diffusion defect.  Labs: CTD serologies 11/26/2020-P ANCA 1:80  Cardiac: Cardiac cath 10/05/2020-normal left and right heart filling pressures.  Single-vessel coronary artery disease  Echocardiogram 05/19/2021- LVEF 60-65%, RV systolic function is normal with mild enlargement.  TR is signal is inadequate for measuring PA pressure.  Assessment:  Idiopathic Pulmonary Fibrosis Has baseline pulmonary fibrosis which seems to be in UIP pattern. Elevation in ANCA is likely nonspecific as he has no signs of connective tissue disease.  I reviewed his biopsy with Dr. 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  Overall presentation is consistent with IPF  Started Pirfenidone in May 2023. Developed photosensitivity reaction in 2024 which appears to have resolved. Reports increased dyspnea and digital cyanosis, possibly exacerbated by cold weather. Oxygen saturation at 94% on 5 liters of oxygen. Holding Esbriet temporarily is considered safe as it is a chronic medication that slows disease progression.  - Hold Esbriet for a week until symptoms improve - Encourage hydration with electrolyte solutions - Prescribe azithromycin and prednisone if lung inflammation is suspected - Advise to seek emergency care if symptoms worsen  Diarrhea Acute onset of diarrhea since last night and this morning. Possible dehydration due to diarrhea. No fever or chills. Differential diagnosis includes possible colitis flare-up. Esbriet does not cause diarrhea, but holding it temporarily may help manage symptoms. - Encourage hydration with electrolyte solutions - Continue taking loperamide for diarrhea - Consider prescribing azithromycin and prednisone if lung inflammation is suspected - Advise to seek emergency care if symptoms worsen  Raynaud's Phenomenon Digital cyanosis  likely due to Raynaud's phenomenon exacerbated by cold weather. Oxygen saturation is 94% on 5 liters of oxygen. - Ensure adequate warmth to prevent exacerbation  Muscle Soreness Muscle soreness in the back, neck, and shoulder, possibly due to physical activity. - Encourage light muscle conditioning exercises without overexertion  Recurrent right pneumothorax Resolved on repeat imaging  Right upper lobe lung adenocarcinoma S/p bronchoscopy and lobectomy Continue surveillance CT scan  Submassive PE Continue Eliquis  Follow-up Has not had recent labs since January. Labs may be beneficial to check blood counts due to current symptoms. - Order labs to check blood counts   Plan/Recommendations: Hold esbriet temporarily Prednisone taper, z pack CXR  Chilton Greathouse MD  Pulmonary and Critical Care 05/03/2023, 10:54 AM  CC: Richard Dills, MD

## 2023-05-04 LAB — CBC WITH DIFFERENTIAL/PLATELET
Basophils Absolute: 0 10*3/uL (ref 0.0–0.1)
Basophils Relative: 0.4 % (ref 0.0–3.0)
Eosinophils Absolute: 0.1 10*3/uL (ref 0.0–0.7)
Eosinophils Relative: 1.8 % (ref 0.0–5.0)
HCT: 47.1 % (ref 39.0–52.0)
Hemoglobin: 16 g/dL (ref 13.0–17.0)
Lymphocytes Relative: 1.3 % — ABNORMAL LOW (ref 12.0–46.0)
Lymphs Abs: 0.1 10*3/uL — ABNORMAL LOW (ref 0.7–4.0)
MCHC: 34.1 g/dL (ref 30.0–36.0)
MCV: 96.5 fl (ref 78.0–100.0)
Monocytes Absolute: 0.7 10*3/uL (ref 0.1–1.0)
Monocytes Relative: 8.7 % (ref 3.0–12.0)
Neutro Abs: 6.9 10*3/uL (ref 1.4–7.7)
Neutrophils Relative %: 87.8 % — ABNORMAL HIGH (ref 43.0–77.0)
Platelets: 350 10*3/uL (ref 150.0–400.0)
RBC: 4.88 Mil/uL (ref 4.22–5.81)
RDW: 13.4 % (ref 11.5–15.5)
WBC: 7.8 10*3/uL (ref 4.0–10.5)

## 2023-05-05 NOTE — Telephone Encounter (Signed)
 Fax confiramtion recived 05/05/23

## 2023-05-10 ENCOUNTER — Telehealth: Payer: Self-pay | Admitting: Pulmonary Disease

## 2023-05-23 NOTE — Telephone Encounter (Signed)
 Updated one has been sent on 05/19/23 I will shred the previous one since this has more on the order

## 2023-05-25 NOTE — Telephone Encounter (Signed)
 CMN singed and faxed successfully 05/25/23

## 2023-05-25 NOTE — Telephone Encounter (Signed)
 Another CMN has been received 05/25/23

## 2023-05-27 DIAGNOSIS — J84112 Idiopathic pulmonary fibrosis: Secondary | ICD-10-CM | POA: Diagnosis not present

## 2023-05-29 ENCOUNTER — Telehealth: Payer: Self-pay | Admitting: Pulmonary Disease

## 2023-05-29 NOTE — Telephone Encounter (Signed)
 Rec'd fax from [facility] needing Dr. Shirlee More signature. Will put in his folder to sign and await return.

## 2023-05-30 DIAGNOSIS — C3411 Malignant neoplasm of upper lobe, right bronchus or lung: Secondary | ICD-10-CM | POA: Diagnosis not present

## 2023-05-30 DIAGNOSIS — Z1331 Encounter for screening for depression: Secondary | ICD-10-CM | POA: Diagnosis not present

## 2023-05-30 DIAGNOSIS — F322 Major depressive disorder, single episode, severe without psychotic features: Secondary | ICD-10-CM | POA: Diagnosis not present

## 2023-05-30 DIAGNOSIS — I25119 Atherosclerotic heart disease of native coronary artery with unspecified angina pectoris: Secondary | ICD-10-CM | POA: Diagnosis not present

## 2023-05-30 DIAGNOSIS — I739 Peripheral vascular disease, unspecified: Secondary | ICD-10-CM | POA: Diagnosis not present

## 2023-05-30 DIAGNOSIS — E7800 Pure hypercholesterolemia, unspecified: Secondary | ICD-10-CM | POA: Diagnosis not present

## 2023-05-30 DIAGNOSIS — I270 Primary pulmonary hypertension: Secondary | ICD-10-CM | POA: Diagnosis not present

## 2023-05-30 DIAGNOSIS — Z902 Acquired absence of lung [part of]: Secondary | ICD-10-CM | POA: Diagnosis not present

## 2023-05-30 DIAGNOSIS — I10 Essential (primary) hypertension: Secondary | ICD-10-CM | POA: Diagnosis not present

## 2023-05-30 DIAGNOSIS — J8410 Pulmonary fibrosis, unspecified: Secondary | ICD-10-CM | POA: Diagnosis not present

## 2023-05-30 DIAGNOSIS — L309 Dermatitis, unspecified: Secondary | ICD-10-CM | POA: Diagnosis not present

## 2023-05-30 DIAGNOSIS — Z0000 Encounter for general adult medical examination without abnormal findings: Secondary | ICD-10-CM | POA: Diagnosis not present

## 2023-05-30 DIAGNOSIS — J849 Interstitial pulmonary disease, unspecified: Secondary | ICD-10-CM | POA: Diagnosis not present

## 2023-05-30 NOTE — Telephone Encounter (Signed)
 Rec'd [facility] fax w/Dr. Shirlee More signature. Will fax to Co.

## 2023-05-31 NOTE — Telephone Encounter (Signed)
 Rc'd signed order. Will fax to [facility] and attach confirmation once rec'd. Then off to scan.

## 2023-06-07 ENCOUNTER — Other Ambulatory Visit (HOSPITAL_COMMUNITY): Payer: Self-pay

## 2023-06-07 ENCOUNTER — Other Ambulatory Visit: Payer: Self-pay

## 2023-06-09 ENCOUNTER — Other Ambulatory Visit: Payer: Self-pay

## 2023-06-09 NOTE — Progress Notes (Signed)
 Specialty Pharmacy Refill Coordination Note  Richard Parrish is a 73 y.o. male contacted today regarding refills of specialty medication(s) Pirfenidone   Patient requested Delivery   Delivery date: 06/12/23   Verified address: [address]  [name] [id number]   Medication will be filled on 06/09/23.

## 2023-06-15 NOTE — Telephone Encounter (Signed)
 NFN

## 2023-06-27 ENCOUNTER — Other Ambulatory Visit: Payer: Self-pay

## 2023-06-27 DIAGNOSIS — J84112 Idiopathic pulmonary fibrosis: Secondary | ICD-10-CM | POA: Diagnosis not present

## 2023-06-27 DIAGNOSIS — F331 Major depressive disorder, recurrent, moderate: Secondary | ICD-10-CM | POA: Diagnosis not present

## 2023-06-30 ENCOUNTER — Other Ambulatory Visit: Payer: Self-pay

## 2023-06-30 NOTE — Progress Notes (Signed)
 Specialty Pharmacy Refill Coordination Note  Richard Parrish is a 73 y.o. male contacted today regarding refills of specialty medication(s) Pirfenidone    Spoke with patient's wife  Patient requested Delivery   Delivery date: 07/05/23   Verified address: [address]  [place] [address]   Medication will be filled on 05.06.25.

## 2023-07-21 ENCOUNTER — Ambulatory Visit: Payer: Self-pay | Admitting: Pulmonary Disease

## 2023-07-21 NOTE — Telephone Encounter (Signed)
 FYI

## 2023-07-21 NOTE — Telephone Encounter (Signed)
 Copied from CRM [id number]. Topic: Clinical - Red Word Triage >> Jul 21, 2023  8:41 AM Richard Parrish wrote: Red Word that prompted transfer to Nurse Triage: Patient's spouse Clayton Curd [phone] states patient is experiencing more weakness and shortness of breath, fell a couple of days did not hit his head (did not go to the hospital or pcp), dizziness, chest pain last night, cannot be without oxygen  for daily life. Korene Pert states patient is not wheezing, fever, nor headaches. They are planning to leave on vacation 08/13/23 and wants to see if patient needs to be seen. Please advise.  TRIAGE SUMMARY NOTE: Pt wife reporting that pt fell on Wednesday after he "caught his feet," seems to have made impact "mostly to knees," confirms no impact to head, has a few scrapes that are healing, but been having more weakness and "little bit more" SOB on his 5L oxygen  lately, also had chest pain for first time in long time last night for which pt took nitroglycerin , pt wife reporting "probably mild" chest pain and pain went away right after nitroglycerin . Pt wife confirms that pt not too weak to stand or walk but not able to walk far. Advised pt be taken to ED for exam, advised call 911 if worsening symptoms. Pt wife verbalized understanding and intent to do so. Please advise if further recommendations.  E2C2 Pulmonary Triage - Initial Assessment Questions "Chief Complaint (e.g., cough, sob, wheezing, fever, chills, sweat or additional symptoms) *Go to specific symptom protocol after initial questions. Chest pain first time in long time right in middle chest probably mild, took nitroglycerin , seemed to help, not much longer than 5 min Still able to stand and walk just not as far as usual  "Have you used your inhalers/maintenance medication?" Yes If yes, "What medications?" Hasn't used the inhaler for long time Esbriet  for pulmonary fibrosis  OXYGEN : "Do you wear supplemental oxygen ?" Yes If yes, "How many liters are you  supposed to use?" 5L continuously, hasn't gone up, machine won't go any further than 5L, hasn't upped it on portable tank  "Do you monitor your oxygen  levels?" Yes If yes, "What is your reading (oxygen  level) today?" Not awake for the day yet  Reason for Disposition  Patient sounds very sick or weak to the triager  Answer Assessment - Initial Assessment Questions 1. MECHANISM: "How did the fall happen?"     Was coming in from outside in hurry to get to bathroom, think he caught his feet, didn't see the fall but heard it, had couple of scrapes but that's it 3. ONSET: "When did the fall happen?" (e.g., minutes, hours, or days ago)     Couple days ago 4. LOCATION: "What part of the body hit the ground?" (e.g., back, buttocks, head, hips, knees, hands, head, stomach)     Mostly just knees 5. INJURY: "Did you hurt (injure) yourself when you fell?" If Yes, ask: "What did you injure? Tell me more about this?" (e.g., body area; type of injury; pain severity)"     A few scrapes 6. PAIN: "Is there any pain?" If Yes, ask: "How bad is the pain?" (e.g., Scale 1-10; or mild,  moderate, severe)   - NONE (0): No pain   - MILD (1-3): Doesn't interfere with normal activities    - MODERATE (4-7): Interferes with normal activities or awakens from sleep    - SEVERE (8-10): Excruciating pain, unable to do any normal activities      denies 7. SIZE: For cuts, bruises,  or swelling, ask: "How large is it?" (e.g., inches or centimeters)      Seem to be healing okay 9. OTHER SYMPTOMS: "Do you have any other symptoms?" (e.g., dizziness, fever, weakness; new onset or worsening).      Couldn't walk far, weakness, little bit more SOB, he pushes himself but not doing that lately  Protocols used: Falls and [facility]

## 2023-07-25 DIAGNOSIS — J9610 Chronic respiratory failure, unspecified whether with hypoxia or hypercapnia: Secondary | ICD-10-CM | POA: Diagnosis not present

## 2023-07-25 DIAGNOSIS — L989 Disorder of the skin and subcutaneous tissue, unspecified: Secondary | ICD-10-CM | POA: Diagnosis not present

## 2023-07-25 DIAGNOSIS — I270 Primary pulmonary hypertension: Secondary | ICD-10-CM | POA: Diagnosis not present

## 2023-07-25 DIAGNOSIS — M25579 Pain in unspecified ankle and joints of unspecified foot: Secondary | ICD-10-CM | POA: Diagnosis not present

## 2023-07-25 LAB — BASIC METABOLIC PANEL WITH GFR: EGFR: 96

## 2023-07-27 ENCOUNTER — Other Ambulatory Visit: Payer: Self-pay

## 2023-07-27 ENCOUNTER — Other Ambulatory Visit: Payer: Self-pay | Admitting: Pulmonary Disease

## 2023-07-27 DIAGNOSIS — J84112 Idiopathic pulmonary fibrosis: Secondary | ICD-10-CM | POA: Diagnosis not present

## 2023-07-27 DIAGNOSIS — I25119 Atherosclerotic heart disease of native coronary artery with unspecified angina pectoris: Secondary | ICD-10-CM | POA: Diagnosis not present

## 2023-07-27 DIAGNOSIS — I739 Peripheral vascular disease, unspecified: Secondary | ICD-10-CM | POA: Diagnosis not present

## 2023-07-27 DIAGNOSIS — J849 Interstitial pulmonary disease, unspecified: Secondary | ICD-10-CM

## 2023-07-27 DIAGNOSIS — I10 Essential (primary) hypertension: Secondary | ICD-10-CM | POA: Diagnosis not present

## 2023-07-27 NOTE — Progress Notes (Signed)
 Specialty Pharmacy Refill Coordination Note  Richard Parrish is a 73 y.o. male contacted today regarding refills of specialty medication(s) Pirfenidone    Patient requested (Patient-Rptd) Delivery   Delivery date: (Patient-Rptd) 08/11/23   Verified address: (Patient-Rptd) [address] [name], [name] [address]   Medication will be filled on 06.12.25.    Refill request is pending, will notify patient via mychart.

## 2023-07-28 ENCOUNTER — Other Ambulatory Visit: Payer: Self-pay

## 2023-07-28 ENCOUNTER — Encounter: Payer: Self-pay | Admitting: Cardiovascular Disease

## 2023-07-28 MED ORDER — PIRFENIDONE 801 MG PO TABS
1.0000 | ORAL_TABLET | Freq: Three times a day (TID) | ORAL | 1 refills | Status: AC
  Filled 2023-07-28: qty 90, 30d supply, fill #0
  Filled 2023-09-05: qty 90, 30d supply, fill #1
  Filled 2023-10-05: qty 90, 30d supply, fill #2
  Filled 2023-11-13 – 2023-11-15 (×2): qty 90, 30d supply, fill #3
  Filled 2023-12-19 – 2023-12-21 (×2): qty 90, 30d supply, fill #4
  Filled 2024-01-18: qty 90, 30d supply, fill #5

## 2023-07-28 NOTE — Telephone Encounter (Signed)
 Refill sent for PIRFENIDONE  to [facility]: [phone]   Dose: 801mg  three times daily  Last OV: 05/03/2023 Provider: Dr. Waylan Haggard Pertinent labs: LFTs 05/03/23 wnl  Next OV: 08/07/2023  Geraldene Kleine, PharmD, MPH, BCPS Clinical Pharmacist (Rheumatology and Pulmonology)

## 2023-07-29 DIAGNOSIS — I10 Essential (primary) hypertension: Secondary | ICD-10-CM | POA: Diagnosis not present

## 2023-07-29 DIAGNOSIS — C3411 Malignant neoplasm of upper lobe, right bronchus or lung: Secondary | ICD-10-CM | POA: Diagnosis not present

## 2023-07-29 DIAGNOSIS — F322 Major depressive disorder, single episode, severe without psychotic features: Secondary | ICD-10-CM | POA: Diagnosis not present

## 2023-07-29 DIAGNOSIS — I739 Peripheral vascular disease, unspecified: Secondary | ICD-10-CM | POA: Diagnosis not present

## 2023-07-29 DIAGNOSIS — F418 Other specified anxiety disorders: Secondary | ICD-10-CM | POA: Diagnosis not present

## 2023-07-29 DIAGNOSIS — I25119 Atherosclerotic heart disease of native coronary artery with unspecified angina pectoris: Secondary | ICD-10-CM | POA: Diagnosis not present

## 2023-07-29 DIAGNOSIS — E7800 Pure hypercholesterolemia, unspecified: Secondary | ICD-10-CM | POA: Diagnosis not present

## 2023-07-31 NOTE — Telephone Encounter (Signed)
 Spoke with pt's wife, Clayton Curd (ok per DPR), pt is not awake yet this morning. Wife states they were told by Dr. Rolm Clos if he had symptoms in the future to give us  a call. Wife states that his pressure is relieved by SL nitroglycerin . Pt has a history of CABG. Appointment made for tomorrow at 9:15am with Hao Meng, PA-C. Wife verbalizes understanding.

## 2023-07-31 NOTE — Telephone Encounter (Signed)
 Thanks for the heads up.

## 2023-08-01 ENCOUNTER — Ambulatory Visit: Attending: Physician Assistant | Admitting: Physician Assistant

## 2023-08-01 ENCOUNTER — Encounter: Payer: Self-pay | Admitting: Physician Assistant

## 2023-08-01 VITALS — BP 118/64 | HR 76 | Ht 72.0 in | Wt 188.0 lb

## 2023-08-01 DIAGNOSIS — E785 Hyperlipidemia, unspecified: Secondary | ICD-10-CM | POA: Diagnosis not present

## 2023-08-01 DIAGNOSIS — Z01812 Encounter for preprocedural laboratory examination: Secondary | ICD-10-CM

## 2023-08-01 DIAGNOSIS — Z981 Arthrodesis status: Secondary | ICD-10-CM | POA: Diagnosis not present

## 2023-08-01 DIAGNOSIS — G8929 Other chronic pain: Secondary | ICD-10-CM | POA: Diagnosis not present

## 2023-08-01 DIAGNOSIS — I2782 Chronic pulmonary embolism: Secondary | ICD-10-CM

## 2023-08-01 DIAGNOSIS — R0789 Other chest pain: Secondary | ICD-10-CM | POA: Diagnosis not present

## 2023-08-01 DIAGNOSIS — G894 Chronic pain syndrome: Secondary | ICD-10-CM | POA: Diagnosis not present

## 2023-08-01 DIAGNOSIS — M5441 Lumbago with sciatica, right side: Secondary | ICD-10-CM | POA: Diagnosis not present

## 2023-08-01 DIAGNOSIS — I25709 Atherosclerosis of coronary artery bypass graft(s), unspecified, with unspecified angina pectoris: Secondary | ICD-10-CM | POA: Diagnosis not present

## 2023-08-01 DIAGNOSIS — M5442 Lumbago with sciatica, left side: Secondary | ICD-10-CM | POA: Diagnosis not present

## 2023-08-01 DIAGNOSIS — J849 Interstitial pulmonary disease, unspecified: Secondary | ICD-10-CM | POA: Diagnosis not present

## 2023-08-01 NOTE — Progress Notes (Unsigned)
 Cardiology Office Note   Date:  08/03/2023  ID:  Devan, Danzer [birth year], MRN [id number] PCP: Merl Star, MD  [facility] Providers Cardiologist:  Oneil Bigness, MD     History of Present Illness Richard Parrish is a 73 y.o. male with past medical history of PAD, CAD s/p CABG x 1 in 1989 with SVG to RCA, DVT/PE of the Eliquis , interstitial lung disease, idiopathic pulmonary fibrosis on 5L [place], stage Ia lung adenocarcinoma s/p resection and hyperlipidemia.  He had a history of popliteal stent placed in 2013 and moderate disease in the left SFA.  Last cardiac catheterization in 2022 for exertional chest pain and dyspnea showed CTO of ostial RCA was patent SVG-RCA.  Medical therapy was recommended.  He was admitted in March 2023 with submassive PE with heart strain and left peroneal vein DVT.  Echocardiogram obtained on 05/19/2021 showed EF of 60 to 65%, no regional wall motion abnormality, mildly enlarged RV.  His Imdur  was discontinued due to orthostasis.  He has been placed on pirfenidone  in May 2023 for his pulmonary condition. He is not on aspirin  given the need for Eliquis .  Patient was most recently seen by Dr. Rolm Clos on 04/05/2023 at which time he was doing well.   Patient presents today accompanied by wife.  He complains of intermittent chest discomfort for the past month.  Symptom is worse with physical activity and alleviated by resting.  Symptom is not worsened by deep inspiration, body rotation or palpation.  EKG showed new T wave inversion in the inferior lead.  He has also been feeling more fatigued and short of breath with exertion.  I am highly concerned that his symptom is due to underlying coronary artery disease.  On further review, even on the last cardiac cath report from 2022, patient had a 60% proximal lesion in the SVG to RCA.  I discussed the case with DOD Dr. Alroy Aspen, we recommend for the patient to proceed with diagnostic cardiac catheterization with possible  PCI.  Patient is aware to hold her Eliquis  for 2 days prior to the procedure and restart the day after the procedure.  I will see the patient back in 3 weeks postprocedure.  ROS:   Patient has been having intermittent chest discomfort for the past month.  He also complains of increasing fatigue.  Studies Reviewed EKG Interpretation Date/Time:  Tuesday August 01 2023 09:41:34 EDT Ventricular Rate:  76 PR Interval:  168 QRS Duration:  92 QT Interval:  388 QTC Calculation: 436 R Axis:   88  Text Interpretation: Normal sinus rhythm  New T wave inversion in the inferior leads Confirmed by Freddi Forster [phone]) on 08/03/2023 8:27:58 AM    Cardiac Studies & Procedures   ______________________________________________________________________________________________ CARDIAC CATHETERIZATION  CARDIAC CATHETERIZATION 10/05/2020  Conclusion Conclusions: Severe single-vessel coronary artery disease with chronic total occlusion of the ostial RCA.  No significant disease noted in the left coronary artery. Patent SVG-RCA with patent mid-graft stent and moderate proximal/mid graft disease that is stable or minimally worse compared to prior catheterization in 2012. Normal left and right heart filling pressures. Low normal Fick cardiac output/index.  Recommendations: Continue aggressive secondary prevention. Escalate medical therapy and consider further evaluation for alternative causes of shortness of breath and chest pain.  Sammy Crisp, MD [facility]  Findings Coronary Findings Diagnostic  Dominance: Right  Left Main Vessel is moderate in size. The vessel exhibits minimal luminal irregularities.  Left Anterior Descending Vessel is moderate in size.  Vessel is angiographically normal.  First Diagonal Branch Vessel is moderate in size.  Second Diagonal Branch Vessel is small in size.  Third Diagonal Branch Vessel is small in size.  Left Circumflex Vessel is moderate in size. Vessel  is angiographically normal.  First Obtuse Marginal Branch Vessel is moderate in size.  Second Obtuse Marginal Branch Vessel is small in size.  Third Obtuse Marginal Branch Vessel is moderate in size.  Right Coronary Artery Vessel is large. Ost RCA to Prox RCA lesion is 100% stenosed. The lesion is chronically occluded.  Right Posterior Descending Artery Vessel is large in size.  Right Posterior Atrioventricular Artery Vessel is large in size.  Saphenous Graft To Dist RCA SVG graft was visualized by angiography and is large. Prox Graft lesion is 60% stenosed. The lesion is eccentric. Mid Graft-1 lesion is 50% stenosed. The lesion is eccentric. Previously placed Mid Graft-2 stent (unknown type) is  widely patent.  Intervention  No interventions have been documented.     ECHOCARDIOGRAM  ECHOCARDIOGRAM COMPLETE 05/19/2021  Narrative ECHOCARDIOGRAM REPORT    Patient Name:   Richard Parrish Date of Exam: 05/19/2021 Medical Rec #:  [id number]        Height:       72.0 in Accession #:    [id number]       Weight:       208.3 lb Date of Birth:  08/15/1950        BSA:          2.168 m Patient Age:    71 years         BP:           00/00 mmHg Patient Gender: M                HR:           67 bpm. Exam Location:  Inpatient  Procedure: 2D Echo and Intracardiac Opacification Agent  Indications:    Pericardial effusion  History:        Patient has prior history of Echocardiogram examinations, most recent 10/04/2020. CAD; Risk Factors:Hypertension.  Sonographer:    Venice Gillis Referring Phys: [id number] ALLISON WOLFE  IMPRESSIONS   1. Left ventricular ejection fraction, by estimation, is 60 to 65%. The left ventricle has normal function. The left ventricle has no regional wall motion abnormalities. Left ventricular diastolic parameters are indeterminate. 2. Right ventricular systolic function is normal. The right ventricular size is mildly enlarged. Tricuspid  regurgitation signal is inadequate for assessing PA pressure. 3. A small pericardial effusion is present. There is no evidence of cardiac tamponade. 4. The mitral valve is grossly normal. No evidence of mitral valve regurgitation. No evidence of mitral stenosis. 5. The aortic valve is tricuspid. Aortic valve regurgitation is not visualized. No aortic stenosis is present.  FINDINGS Left Ventricle: Left ventricular ejection fraction, by estimation, is 60 to 65%. The left ventricle has normal function. The left ventricle has no regional wall motion abnormalities. The left ventricular internal cavity size was normal in size. There is no left ventricular hypertrophy. Left ventricular diastolic parameters are indeterminate.  Right Ventricle: The right ventricular size is mildly enlarged. No increase in right ventricular wall thickness. Right ventricular systolic function is normal. Tricuspid regurgitation signal is inadequate for assessing PA pressure.  Left Atrium: Left atrial size was normal in size.  Right Atrium: Right atrial size was normal in size.  Pericardium: A small pericardial effusion is present. There is no evidence of cardiac  tamponade.  Mitral Valve: The mitral valve is grossly normal. No evidence of mitral valve regurgitation. No evidence of mitral valve stenosis.  Tricuspid Valve: The tricuspid valve is grossly normal. Tricuspid valve regurgitation is not demonstrated. No evidence of tricuspid stenosis.  Aortic Valve: The aortic valve is tricuspid. Aortic valve regurgitation is not visualized. No aortic stenosis is present. Aortic valve peak gradient measures 5.2 mmHg.  Pulmonic Valve: The pulmonic valve was grossly normal. Pulmonic valve regurgitation is not visualized. No evidence of pulmonic stenosis.  Aorta: The aortic root and ascending aorta are structurally normal, with no evidence of dilitation.  Venous: The inferior vena cava was not well visualized.  IAS/Shunts: The  atrial septum is grossly normal.   LEFT VENTRICLE PLAX 2D LVIDd:         4.85 cm   Diastology LVIDs:         3.55 cm   LV e' medial:    3.57 cm/s LV PW:         1.50 cm   LV E/e' medial:  16.2 LV IVS:        1.30 cm   LV e' lateral:   5.70 cm/s LVOT diam:     2.10 cm   LV E/e' lateral: 10.1 LV SV:         74 LV SV Index:   34 LVOT Area:     3.46 cm   RIGHT VENTRICLE            IVC RV Basal diam:  2.80 cm    IVC diam: 2.40 cm RV S prime:     9.62 cm/s TAPSE (M-mode): 1.5 cm  LEFT ATRIUM             Index        RIGHT ATRIUM           Index LA diam:        4.60 cm 2.12 cm/m   RA Area:     18.70 cm LA Vol (A2C):   37.5 ml 17.30 ml/m  RA Volume:   46.90 ml  21.63 ml/m LA Vol (A4C):   44.2 ml 20.39 ml/m LA Biplane Vol: 40.8 ml 18.82 ml/m AORTIC VALVE                 PULMONIC VALVE AV Area (Vmax): 3.11 cm     PV Vmax:       0.69 m/s AV Vmax:        113.50 cm/s  PV Peak grad:  1.9 mmHg AV Peak Grad:   5.2 mmHg LVOT Vmax:      102.00 cm/s LVOT Vmean:     59.700 cm/s LVOT VTI:       0.215 m  AORTA Ao Root diam: 3.85 cm Ao Asc diam:  3.30 cm  MITRAL VALVE MV Area (PHT): 3.81 cm    SHUNTS MV Decel Time: 199 msec    Systemic VTI:  0.22 m MV E velocity: 57.80 cm/s  Systemic Diam: 2.10 cm MV A velocity: 67.70 cm/s MV E/A ratio:  0.85  Jackquelyn Mass MD Electronically signed by Jackquelyn Mass MD Signature Date/Time: 05/19/2021/12:59:14 PM    Final          ______________________________________________________________________________________________      Risk Assessment/Calculations          Physical Exam VS:  BP 118/64   Pulse 76   Ht 6' (1.829 m)   Wt 188 lb (85.3 kg)   SpO2 (!) 88%  BMI 25.50 kg/m    Wt Readings from Last 3 Encounters:  08/01/23 188 lb (85.3 kg)  05/03/23 183 lb (83 kg)  04/05/23 191 lb (86.6 kg)    GEN: Well nourished, well developed in no acute distress NECK: No JVD; No carotid bruits CARDIAC: RRR, no murmurs, rubs,  gallops RESPIRATORY:  Clear to auscultation without rales, wheezing or rhonchi  ABDOMEN: Soft, non-tender, non-distended EXTREMITIES:  No edema; No deformity   ASSESSMENT AND PLAN  Chest discomfort: Accompanied by increasing fatigue, symptom is worsened with exertion and alleviated by resting.  Overall symptoms concerning for angina.  Discussed the case with DOD Dr. Alroy Aspen, plan to proceed with cardiac catheterization for definitive evaluation.  Patient had blood work done at her PCPs office last week, I have called the PCPs office and confirmed of this, they will send the lab result over to our office.  CAD s/p CABG: See #1.  Will need to hold Eliquis  for 2 days prior to anticipated cardiac catheterization.  History of interstitial lung disease: Breathing is stable  History of DVT/PE: On Eliquis , this will need to be held for 2 days prior to the upcoming procedure  Hyperlipidemia: On atorvastatin      Informed Consent   Shared Decision Making/Informed Consent The risks [stroke (1 in 1000), death (1 in 1000), kidney failure [usually temporary] (1 in 500), bleeding (1 in 200), allergic reaction [possibly serious] (1 in 200)], benefits (diagnostic support and management of coronary artery disease) and alternatives of a cardiac catheterization were discussed in detail with Mr. Brassell and he is willing to proceed.     Dispo: Will follow-up with the patient again in 3 weeks.  Signed, Ervin Heath, PA

## 2023-08-01 NOTE — Patient Instructions (Signed)
 Medication Instructions:  NO CHANGES *If you need a refill on your cardiac medications before your next appointment, please call your pharmacy*  Lab Work: NO LABS If you have labs (blood work) drawn today and your tests are completely normal, you will receive your results only by: MyChart Message (if you have MyChart) OR A paper copy in the mail If you have any lab test that is abnormal or we need to change your treatment, we will call you to review the results.  Testing/Procedures: Your physician has requested that you have a cardiac catheterization. Cardiac catheterization is used to diagnose and/or treat various heart conditions. Doctors may recommend this procedure for a number of different reasons. The most common reason is to evaluate chest pain. Chest pain can be a symptom of coronary artery disease (CAD), and cardiac catheterization can show whether plaque is narrowing or blocking your heart's arteries. This procedure is also used to evaluate the valves, as well as measure the blood flow and oxygen  levels in different parts of your heart. For further information please visit [email or web address] Please follow instruction sheet, as given.  Follow-Up: At [facility], you and your health needs are our priority.  As part of our continuing mission to provide you with exceptional heart care, our providers are all part of one team.  This team includes your primary Cardiologist (physician) and Advanced Practice Providers or APPs (Physician Assistants and Nurse Practitioners) who all work together to provide you with the care you need, when you need it.  Your next appointment:   3-4 week(s) after heart cath  Provider:   Ervin Heath, PA   [facility] A DEPT OF [place] [address] A DEPT OF THE [place]. [facility] [address] [place] [address] Dept: [phone] Loc: [phone]  CEPHUS TUPY  08/01/2023  You are scheduled  for a Cardiac Catheterization on Friday, June 6 with Dr. Fransico Ivy.  1. Please arrive at the [facility] (Main Entrance A) at [facility]: [address] [place], [place] [address] at 7:00 AM (This time is 2 hour(s) before your procedure to ensure your preparation).   Free valet parking service is available. You will check in at ADMITTING. The support person will be asked to wait in the waiting room.  It is OK to have someone drop you off and come back when you are ready to be discharged.    Special note: Every effort is made to have your procedure done on time. Please understand that emergencies sometimes delay scheduled procedures.  2. Diet: Do not eat solid foods after midnight.  The patient may have clear liquids until 5am upon the day of the procedure.  3. Labs: You will need to have blood drawn by Wednesday, June 4 at [facility] (1st Floor), [address] [place], [place] [address]. You do not need to be fasting.  4. Medication instructions in preparation for your procedure:   Contrast Allergy: No   Current Outpatient Medications (Endocrine & Metabolic):    budesonide  (ENTOCORT EC ) 3 MG 24 hr capsule, Take 3 mg by mouth daily.  Current Outpatient Medications (Cardiovascular):    atorvastatin  (LIPITOR ) 80 MG tablet, Take 1 tablet (80 mg total) by mouth daily.   carvedilol  (COREG ) 6.25 MG tablet, Take 1 tablet (6.25 mg total) by mouth 2 (two) times daily.   nitroGLYCERIN  (NITROSTAT ) 0.4 MG SL tablet,  Place 1 tablet (0.4 mg total) under the tongue every 5 (five) minutes as needed for chest pain.  Current Outpatient Medications (Respiratory):    diphenhydrAMINE  (BENADRYL ) 25 mg capsule, Take 50 mg by mouth daily as needed (sinus drainage).   guaiFENesin  (MUCINEX ) 600 MG 12 hr tablet, Take 1 tablet (600 mg total) by mouth 2 (two) times daily as needed. (Patient taking differently: Take 600 mg by mouth 2 (two) times daily as  needed for to loosen phlegm.)   Pirfenidone  801 MG TABS, Take 1 tablet (801 mg total) by mouth 3 (three) times daily with meals.   sodium chloride  (OCEAN) 0.65 % SOLN nasal spray, Place 1 spray into both nostrils daily as needed for congestion.  Current Outpatient Medications (Analgesics):    acetaminophen  (TYLENOL ) 500 MG tablet, Take 1 tablet (500 mg total) by mouth every 6 (six) hours as needed for mild pain.   HYDROcodone -acetaminophen  (NORCO/VICODIN) 5-325 MG tablet, Take 1 tablet by mouth 5 (five) times daily.   traMADol  (ULTRAM ) 50 MG tablet, Take 1 tablet (50 mg total) by mouth every 6 (six) hours as needed.  Current Outpatient Medications (Hematological):    ELIQUIS  5 MG TABS tablet, TAKE 1 TABLET BY MOUTH TWICE A DAY  Current Outpatient Medications (Other):    ALPRAZolam  (XANAX ) 0.5 MG tablet, Take 0.5-1 tablets (0.25-0.5 mg total) by mouth 3 (three) times daily as needed for anxiety or sleep. (Patient taking differently: Take 0.5 mg by mouth 4 (four) times daily as needed for anxiety or sleep.)   antiseptic oral rinse (BIOTENE) LIQD, 15 mLs by Mouth Rinse route daily.   cholecalciferol  (VITAMIN D3) 25 MCG (1000 UNIT) tablet, Take 1,000 Units by mouth 3 (three) times a week.   hyoscyamine  (LEVSIN, ANASPAZ ) 0.125 MG tablet, Take 0.125 mg by mouth every 4 (four) hours as needed for cramping.    methocarbamol  (ROBAXIN ) 750 MG tablet, Take 750 mg by mouth every 8 (eight) hours as needed.   OXYGEN , Inhale 5 L/min into the lungs continuous.   pantoprazole  (PROTONIX ) 40 MG tablet, TAKE 1 TABLET BY MOUTH EVERY DAY BEFORE BREAKFAST   PARoxetine  (PAXIL ) 20 MG tablet, Take 20 mg by mouth daily.   tamsulosin  (FLOMAX ) 0.4 MG CAPS capsule, Take 0.4 mg by mouth in the morning and at bedtime.   triamcinolone  cream (KENALOG ) 0.1 %, Apply 1 Application topically 2 (two) times daily as needed (for itching). *For reference purposes while preparing patient instructions.   Delete this med list prior to  printing instructions for patient.*  Stop taking Eliquis  (Apixiban) on Wednesday, June 4.   On the morning of your procedure, take your Aspirin  81 mg and any morning medicines NOT listed above.  You may use sips of water.  5. Plan to go home the same day, you will only stay overnight if medically necessary. 6. Bring a current list of your medications and current insurance cards. 7. You MUST have a responsible person to drive you home. 8. Someone MUST be with you the first 24 hours after you arrive home or your discharge will be delayed. 9. Please wear clothes that are easy to get on and off and wear slip-on shoes.  Thank you for allowing us  to care for you!   -- [facility]

## 2023-08-02 ENCOUNTER — Ambulatory Visit: Payer: Self-pay | Admitting: *Deleted

## 2023-08-02 ENCOUNTER — Telehealth: Payer: Self-pay | Admitting: *Deleted

## 2023-08-02 NOTE — Telephone Encounter (Signed)
 Patient's wife tells me patient had lab done last week at PCP so they were not repeated for cath 08/04/23. I am trying to locate those results.

## 2023-08-02 NOTE — Telephone Encounter (Signed)
 Cardiac Catheterization scheduled at [facility] for: Friday August 02, 2023 9 AM Arrival time [facility] Main Entrance A at: 7 AM  Nothing to eat after midnight prior to procedure, clear liquids until 5 AM day of procedure.  Medication instructions: -Hold:  Eliquis -none 08/02/23 until post procedure  -Other usual morning medications can be taken with sips of water including aspirin  81 mg.  Plan to go home the same day, you will only stay overnight if medically necessary.  You must have responsible adult to drive you home.  Someone must be with you the first 24 hours after you arrive home.   Reviewed procedure instructions with patient's wife (DPR), Korene Pert.

## 2023-08-03 ENCOUNTER — Other Ambulatory Visit: Payer: Self-pay | Admitting: Physician Assistant

## 2023-08-03 NOTE — Telephone Encounter (Signed)
 BMP/CBC done 07/25/23 at PCP-the results have been scanned to Epic under the Lab tab.

## 2023-08-04 ENCOUNTER — Encounter (HOSPITAL_COMMUNITY)
Admission: RE | Disposition: A | Discharge status: Home / Self Care | Payer: Self-pay | Source: Home / Self Care | Attending: Cardiology

## 2023-08-04 ENCOUNTER — Other Ambulatory Visit: Payer: Self-pay

## 2023-08-04 ENCOUNTER — Ambulatory Visit (HOSPITAL_COMMUNITY)
Admission: RE | Admit: 2023-08-04 | Discharge: 2023-08-04 | Disposition: A | Discharge status: Home / Self Care | Attending: Cardiology | Admitting: Cardiology

## 2023-08-04 DIAGNOSIS — J849 Interstitial pulmonary disease, unspecified: Secondary | ICD-10-CM | POA: Insufficient documentation

## 2023-08-04 DIAGNOSIS — I25708 Atherosclerosis of coronary artery bypass graft(s), unspecified, with other forms of angina pectoris: Secondary | ICD-10-CM | POA: Diagnosis not present

## 2023-08-04 DIAGNOSIS — I25118 Atherosclerotic heart disease of native coronary artery with other forms of angina pectoris: Secondary | ICD-10-CM | POA: Diagnosis not present

## 2023-08-04 DIAGNOSIS — Z86711 Personal history of pulmonary embolism: Secondary | ICD-10-CM | POA: Diagnosis not present

## 2023-08-04 DIAGNOSIS — Z86718 Personal history of other venous thrombosis and embolism: Secondary | ICD-10-CM | POA: Insufficient documentation

## 2023-08-04 DIAGNOSIS — Z951 Presence of aortocoronary bypass graft: Secondary | ICD-10-CM | POA: Diagnosis not present

## 2023-08-04 DIAGNOSIS — Z7901 Long term (current) use of anticoagulants: Secondary | ICD-10-CM | POA: Diagnosis not present

## 2023-08-04 DIAGNOSIS — E785 Hyperlipidemia, unspecified: Secondary | ICD-10-CM | POA: Insufficient documentation

## 2023-08-04 HISTORY — PX: LEFT HEART CATH AND CORONARY ANGIOGRAPHY: CATH118249

## 2023-08-04 SURGERY — LEFT HEART CATH AND CORONARY ANGIOGRAPHY
Anesthesia: LOCAL

## 2023-08-04 MED ORDER — SODIUM CHLORIDE 0.9 % IV SOLN: 250.0000 mL | INTRAVENOUS | Status: DC | PRN

## 2023-08-04 MED ORDER — VERAPAMIL HCL 2.5 MG/ML IV SOLN
INTRAVENOUS | Status: DC | PRN
  Administered 2023-08-04: 10 mL via INTRA_ARTERIAL

## 2023-08-04 MED ORDER — SODIUM CHLORIDE 0.9 % IV SOLN
INTRAVENOUS | Status: DC | PRN
  Administered 2023-08-04: 10 mL/h via INTRAVENOUS

## 2023-08-04 MED ORDER — SODIUM CHLORIDE 0.9% FLUSH: 3.0000 mL | INTRAVENOUS | Status: DC | PRN

## 2023-08-04 MED ORDER — SODIUM CHLORIDE 0.9 % WEIGHT BASED INFUSION: 3.0000 mL/kg/h | INTRAVENOUS | Status: AC

## 2023-08-04 MED ORDER — FENTANYL CITRATE (PF) 100 MCG/2ML IJ SOLN
INTRAMUSCULAR | Status: AC
  Filled 2023-08-04: qty 2

## 2023-08-04 MED ORDER — ASPIRIN 81 MG PO CHEW: 81.0000 mg | CHEWABLE_TABLET | ORAL | Status: DC

## 2023-08-04 MED ORDER — VERAPAMIL HCL 2.5 MG/ML IV SOLN
INTRAVENOUS | Status: AC
Start: 2023-08-04 — End: ?
  Filled 2023-08-04: qty 2

## 2023-08-04 MED ORDER — MIDAZOLAM HCL 2 MG/2ML IJ SOLN
INTRAMUSCULAR | Status: AC
  Filled 2023-08-04: qty 2

## 2023-08-04 MED ORDER — HEPARIN (PORCINE) IN NACL 1000-0.9 UT/500ML-% IV SOLN
INTRAVENOUS | Status: DC | PRN
  Administered 2023-08-04 (×2): 500 mL

## 2023-08-04 MED ORDER — SODIUM CHLORIDE 0.9% FLUSH: 3.0000 mL | Freq: Two times a day (BID) | INTRAVENOUS | Status: DC

## 2023-08-04 MED ORDER — APIXABAN 5 MG PO TABS: 5.0000 mg | ORAL_TABLET | Freq: Two times a day (BID) | ORAL | 1 refills | Status: AC

## 2023-08-04 MED ORDER — LIDOCAINE HCL (PF) 1 % IJ SOLN
INTRAMUSCULAR | Status: AC
  Filled 2023-08-04: qty 30

## 2023-08-04 MED ORDER — IOHEXOL 350 MG/ML SOLN
INTRAVENOUS | Status: DC | PRN
  Administered 2023-08-04: 25 mL

## 2023-08-04 MED ORDER — SODIUM CHLORIDE 0.9 % WEIGHT BASED INFUSION: 1.0000 mL/kg/h | INTRAVENOUS | Status: DC

## 2023-08-04 MED ORDER — FENTANYL CITRATE (PF) 100 MCG/2ML IJ SOLN
INTRAMUSCULAR | Status: DC | PRN
  Administered 2023-08-04: 25 ug via INTRAVENOUS

## 2023-08-04 MED ORDER — LIDOCAINE HCL (PF) 1 % IJ SOLN
INTRAMUSCULAR | Status: DC | PRN
  Administered 2023-08-04: 2 mL via INTRADERMAL

## 2023-08-04 MED ORDER — MIDAZOLAM HCL 2 MG/2ML IJ SOLN
INTRAMUSCULAR | Status: DC | PRN
  Administered 2023-08-04: 1 mg via INTRAVENOUS

## 2023-08-04 MED ORDER — ACETAMINOPHEN 325 MG PO TABS: 650.0000 mg | ORAL_TABLET | ORAL | Status: DC | PRN

## 2023-08-04 MED ORDER — HYDRALAZINE HCL 20 MG/ML IJ SOLN: 10.0000 mg | INTRAMUSCULAR | Status: DC | PRN

## 2023-08-04 MED ORDER — ONDANSETRON HCL 4 MG/2ML IJ SOLN: 4.0000 mg | Freq: Four times a day (QID) | INTRAMUSCULAR | Status: DC | PRN

## 2023-08-04 MED ORDER — HEPARIN SODIUM (PORCINE) 1000 UNIT/ML IJ SOLN
INTRAMUSCULAR | Status: DC | PRN
  Administered 2023-08-04: 4000 [IU] via INTRAVENOUS
  Administered 2023-08-04: 1000 [IU] via INTRAVENOUS

## 2023-08-04 MED ORDER — HEPARIN SODIUM (PORCINE) 1000 UNIT/ML IJ SOLN
INTRAMUSCULAR | Status: AC
  Filled 2023-08-04: qty 10

## 2023-08-04 MED ORDER — SODIUM CHLORIDE 0.9 % IV SOLN: INTRAVENOUS | Status: DC

## 2023-08-04 SURGICAL SUPPLY — 10 items
CATH INFINITI 5 FR JL3.5 (CATHETERS) IMPLANT
CATH INFINITI 5FR AL1 (CATHETERS) IMPLANT
CATH INFINITI AMBI 5FR TG (CATHETERS) IMPLANT
CATH INFINITI JR4 5F (CATHETERS) IMPLANT
DEVICE RAD COMP TR BAND LRG (VASCULAR PRODUCTS) IMPLANT
GLIDESHEATH SLEND A-KIT 6F 22G (SHEATH) IMPLANT
GUIDEWIRE INQWIRE 1.5J.035X260 (WIRE) IMPLANT
PACK CARDIAC CATHETERIZATION (CUSTOM PROCEDURE TRAY) ×1 IMPLANT
SET ATX-X65L (MISCELLANEOUS) IMPLANT
SHEATH 6FR 75 DEST SLENDER (SHEATH) IMPLANT

## 2023-08-04 NOTE — Discharge Instructions (Signed)

## 2023-08-04 NOTE — Interval H&P Note (Signed)
 History and Physical Interval Note:  08/04/2023 8:56 AM  Richard Parrish  has presented today for surgery, with the diagnosis of chest pain.  The various methods of treatment have been discussed with the patient and family. After consideration of risks, benefits and other options for treatment, the patient has consented to  Procedure(s): LEFT HEART CATH AND CORONARY ANGIOGRAPHY (N/A) as a surgical intervention.  The patient's history has been reviewed, patient examined, no change in status, stable for surgery.  I have reviewed the patient's chart and labs.  Questions were answered to the patient's satisfaction.     Elam Ellis J Shamiyah Ngu

## 2023-08-04 NOTE — H&P (Signed)
 OV 08/01/2023 copied for documentation   Cardiology Office Note   Date:  08/04/2023  ID:  Parrish, Richard Dec 19, 1950, MRN [id number] PCP: Merl Star, MD  [facility] Providers Cardiologist:  Oneil Bigness, MD     History of Present Illness Richard Parrish is a 73 y.o. male with past medical history of PAD, CAD s/p CABG x 1 in 1989 with SVG to RCA, DVT/PE of the Eliquis , interstitial lung disease, idiopathic pulmonary fibrosis on 5L [place], stage Ia lung adenocarcinoma s/p resection and hyperlipidemia.  He had a history of popliteal stent placed in 2013 and moderate disease in the left SFA.  Last cardiac catheterization in 2022 for exertional chest pain and dyspnea showed CTO of ostial RCA was patent SVG-RCA.  Medical therapy was recommended.  He was admitted in March 2023 with submassive PE with heart strain and left peroneal vein DVT.  Echocardiogram obtained on 05/19/2021 showed EF of 60 to 65%, no regional wall motion abnormality, mildly enlarged RV.  His Imdur  was discontinued due to orthostasis.  He has been placed on pirfenidone  in May 2023 for his pulmonary condition. He is not on aspirin  given the need for Eliquis .  Patient was most recently seen by Dr. Rolm Clos on 04/05/2023 at which time he was doing well.   Patient presents today accompanied by wife.  He complains of intermittent chest discomfort for the past month.  Symptom is worse with physical activity and alleviated by resting.  Symptom is not worsened by deep inspiration, body rotation or palpation.  EKG showed new T wave inversion in the inferior lead.  He has also been feeling more fatigued and short of breath with exertion.  I am highly concerned that his symptom is due to underlying coronary artery disease.  On further review, even on the last cardiac cath report from 2022, patient had a 60% proximal lesion in the SVG to RCA.  I discussed the case with DOD Dr. Alroy Aspen, we recommend for the patient to proceed with diagnostic  cardiac catheterization with possible PCI.  Patient is aware to hold her Eliquis  for 2 days prior to the procedure and restart the day after the procedure.  I will see the patient back in 3 weeks postprocedure.  ROS:   Patient has been having intermittent chest discomfort for the past month.  He also complains of increasing fatigue.  Studies Reviewed      Cardiac Studies & Procedures   ______________________________________________________________________________________________ CARDIAC CATHETERIZATION  CARDIAC CATHETERIZATION 10/05/2020  Conclusion Conclusions: Severe single-vessel coronary artery disease with chronic total occlusion of the ostial RCA.  No significant disease noted in the left coronary artery. Patent SVG-RCA with patent mid-graft stent and moderate proximal/mid graft disease that is stable or minimally worse compared to prior catheterization in 2012. Normal left and right heart filling pressures. Low normal Fick cardiac output/index.  Recommendations: Continue aggressive secondary prevention. Escalate medical therapy and consider further evaluation for alternative causes of shortness of breath and chest pain.  Sammy Crisp, MD [facility]  Findings Coronary Findings Diagnostic  Dominance: Right  Left Main Vessel is moderate in size. The vessel exhibits minimal luminal irregularities.  Left Anterior Descending Vessel is moderate in size. Vessel is angiographically normal.  First Diagonal Branch Vessel is moderate in size.  Second Diagonal Branch Vessel is small in size.  Third Diagonal Branch Vessel is small in size.  Left Circumflex Vessel is moderate in size. Vessel is angiographically normal.  First Obtuse Marginal Branch Vessel  is moderate in size.  Second Obtuse Marginal Branch Vessel is small in size.  Third Obtuse Marginal Branch Vessel is moderate in size.  Right Coronary Artery Vessel is large. Ost RCA to Prox RCA lesion is  100% stenosed. The lesion is chronically occluded.  Right Posterior Descending Artery Vessel is large in size.  Right Posterior Atrioventricular Artery Vessel is large in size.  Saphenous Graft To Dist RCA SVG graft was visualized by angiography and is large. Prox Graft lesion is 60% stenosed. The lesion is eccentric. Mid Graft-1 lesion is 50% stenosed. The lesion is eccentric. Previously placed Mid Graft-2 stent (unknown type) is  widely patent.  Intervention  No interventions have been documented.     ECHOCARDIOGRAM  ECHOCARDIOGRAM COMPLETE 05/19/2021  Narrative ECHOCARDIOGRAM REPORT    Patient Name:   Richard Parrish Date of Exam: 05/19/2021 Medical Rec #:  [id number]        Height:       72.0 in Accession #:    [id number]       Weight:       208.3 lb Date of Birth:  Jun 02, 1950        BSA:          2.168 m Patient Age:    71 years         BP:           00/00 mmHg Patient Gender: M                HR:           67 bpm. Exam Location:  Inpatient  Procedure: 2D Echo and Intracardiac Opacification Agent  Indications:    Pericardial effusion  History:        Patient has prior history of Echocardiogram examinations, most recent 10/04/2020. CAD; Risk Factors:Hypertension.  Sonographer:    Venice Gillis Referring Phys: [id number] Richard Parrish  IMPRESSIONS   1. Left ventricular ejection fraction, by estimation, is 60 to 65%. The left ventricle has normal function. The left ventricle has no regional wall motion abnormalities. Left ventricular diastolic parameters are indeterminate. 2. Right ventricular systolic function is normal. The right ventricular size is mildly enlarged. Tricuspid regurgitation signal is inadequate for assessing PA pressure. 3. A small pericardial effusion is present. There is no evidence of cardiac tamponade. 4. The mitral valve is grossly normal. No evidence of mitral valve regurgitation. No evidence of mitral stenosis. 5. The aortic valve is  tricuspid. Aortic valve regurgitation is not visualized. No aortic stenosis is present.  FINDINGS Left Ventricle: Left ventricular ejection fraction, by estimation, is 60 to 65%. The left ventricle has normal function. The left ventricle has no regional wall motion abnormalities. The left ventricular internal cavity size was normal in size. There is no left ventricular hypertrophy. Left ventricular diastolic parameters are indeterminate.  Right Ventricle: The right ventricular size is mildly enlarged. No increase in right ventricular wall thickness. Right ventricular systolic function is normal. Tricuspid regurgitation signal is inadequate for assessing PA pressure.  Left Atrium: Left atrial size was normal in size.  Right Atrium: Right atrial size was normal in size.  Pericardium: A small pericardial effusion is present. There is no evidence of cardiac tamponade.  Mitral Valve: The mitral valve is grossly normal. No evidence of mitral valve regurgitation. No evidence of mitral valve stenosis.  Tricuspid Valve: The tricuspid valve is grossly normal. Tricuspid valve regurgitation is not demonstrated. No evidence of tricuspid stenosis.  Aortic Valve: The aortic valve is  tricuspid. Aortic valve regurgitation is not visualized. No aortic stenosis is present. Aortic valve peak gradient measures 5.2 mmHg.  Pulmonic Valve: The pulmonic valve was grossly normal. Pulmonic valve regurgitation is not visualized. No evidence of pulmonic stenosis.  Aorta: The aortic root and ascending aorta are structurally normal, with no evidence of dilitation.  Venous: The inferior vena cava was not well visualized.  IAS/Shunts: The atrial septum is grossly normal.   LEFT VENTRICLE PLAX 2D LVIDd:         4.85 cm   Diastology LVIDs:         3.55 cm   LV e' medial:    3.57 cm/s LV PW:         1.50 cm   LV E/e' medial:  16.2 LV IVS:        1.30 cm   LV e' lateral:   5.70 cm/s LVOT diam:     2.10 cm   LV E/e'  lateral: 10.1 LV SV:         74 LV SV Index:   34 LVOT Area:     3.46 cm   RIGHT VENTRICLE            IVC RV Basal diam:  2.80 cm    IVC diam: 2.40 cm RV S prime:     9.62 cm/s TAPSE (M-mode): 1.5 cm  LEFT ATRIUM             Index        RIGHT ATRIUM           Index LA diam:        4.60 cm 2.12 cm/m   RA Area:     18.70 cm LA Vol (A2C):   37.5 ml 17.30 ml/m  RA Volume:   46.90 ml  21.63 ml/m LA Vol (A4C):   44.2 ml 20.39 ml/m LA Biplane Vol: 40.8 ml 18.82 ml/m AORTIC VALVE                 PULMONIC VALVE AV Area (Vmax): 3.11 cm     PV Vmax:       0.69 m/s AV Vmax:        113.50 cm/s  PV Peak grad:  1.9 mmHg AV Peak Grad:   5.2 mmHg LVOT Vmax:      102.00 cm/s LVOT Vmean:     59.700 cm/s LVOT VTI:       0.215 m  AORTA Ao Root diam: 3.85 cm Ao Asc diam:  3.30 cm  MITRAL VALVE MV Area (PHT): 3.81 cm    SHUNTS MV Decel Time: 199 msec    Systemic VTI:  0.22 m MV E velocity: 57.80 cm/s  Systemic Diam: 2.10 cm MV A velocity: 67.70 cm/s MV E/A ratio:  0.85  Jackquelyn Mass MD Electronically signed by Jackquelyn Mass MD Signature Date/Time: 05/19/2021/12:59:14 PM    Final          ______________________________________________________________________________________________      Risk Assessment/Calculations          Physical Exam VS:  BP 136/87   Pulse 65   Temp 97.8 F (36.6 C) (Oral)   Resp 18   Ht 6' (1.829 m)   Wt 85.3 kg   SpO2 94%   BMI 25.50 kg/m    Wt Readings from Last 3 Encounters:  08/04/23 85.3 kg  08/01/23 85.3 kg  05/03/23 83 kg    GEN: Well nourished, well developed in no acute distress NECK: No JVD;  No carotid bruits CARDIAC: RRR, no murmurs, rubs, gallops RESPIRATORY:  Clear to auscultation without rales, wheezing or rhonchi  ABDOMEN: Soft, non-tender, non-distended EXTREMITIES:  No edema; No deformity   ASSESSMENT AND PLAN  Chest discomfort: Accompanied by increasing fatigue, symptom is worsened with exertion and alleviated  by resting.  Overall symptoms concerning for angina.  Discussed the case with DOD Dr. Alroy Aspen, plan to proceed with cardiac catheterization for definitive evaluation.  Patient had blood work done at her PCPs office last week, I have called the PCPs office and confirmed of this, they will send the lab result over to our office.  CAD s/p CABG: See #1.  Will need to hold Eliquis  for 2 days prior to anticipated cardiac catheterization.  History of interstitial lung disease: Breathing is stable  History of DVT/PE: On Eliquis , this will need to be held for 2 days prior to the upcoming procedure  Hyperlipidemia: On atorvastatin      Informed Consent   Shared Decision Making/Informed Consent The risks [stroke (1 in 1000), death (1 in 1000), kidney failure [usually temporary] (1 in 500), bleeding (1 in 200), allergic reaction [possibly serious] (1 in 200)], benefits (diagnostic support and management of coronary artery disease) and alternatives of a cardiac catheterization were discussed in detail with Mr. Kujawa and he is willing to proceed.     Dispo: Will follow-up with the patient again in 3 weeks.  Signed, Cody Das, MD

## 2023-08-04 NOTE — Progress Notes (Signed)
 Patient and wife was given discharge instructions. Both verbalized understanding.

## 2023-08-07 ENCOUNTER — Encounter (HOSPITAL_COMMUNITY): Payer: Self-pay | Admitting: Cardiology

## 2023-08-07 ENCOUNTER — Ambulatory Visit: Admitting: Pulmonary Disease

## 2023-08-07 VITALS — BP 113/71 | HR 64 | Ht 72.0 in | Wt 187.6 lb

## 2023-08-07 DIAGNOSIS — Z87891 Personal history of nicotine dependence: Secondary | ICD-10-CM

## 2023-08-07 DIAGNOSIS — J84112 Idiopathic pulmonary fibrosis: Secondary | ICD-10-CM | POA: Diagnosis not present

## 2023-08-07 DIAGNOSIS — Z5181 Encounter for therapeutic drug level monitoring: Secondary | ICD-10-CM | POA: Diagnosis not present

## 2023-08-07 NOTE — Patient Instructions (Addendum)
 VISIT SUMMARY:  Today, you came in for a follow-up visit regarding your pulmonary fibrosis. We discussed your current medications, recent cardiac catheterization, and a recent ankle injury. You are scheduled for a lung function test today to further evaluate your pulmonary condition.  YOUR PLAN:  -PULMONARY FIBROSIS: Pulmonary fibrosis is a condition where the lung tissue becomes scarred and stiff, making it difficult to breathe. You are currently managing this with pirfenidone . Continue taking this medication, and remember to protect yourself from the sun by using sunscreen and wearing long sleeve shirts, pants, and hats when outdoors. We will review your scan results in July and have scheduled a lung function test in six months.  -ANKLE SPRAIN: An ankle sprain is an injury to the ligaments in your ankle. Your recent X-ray showed no fractures. Continue to monitor for any changes or worsening of symptoms.  INSTRUCTIONS:  Please follow up for a lung function test in six months and review your scan results in July. Continue to monitor your ankle for any changes or worsening of symptoms.
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
## 2023-08-09 ENCOUNTER — Other Ambulatory Visit (HOSPITAL_BASED_OUTPATIENT_CLINIC_OR_DEPARTMENT_OTHER): Payer: Self-pay

## 2023-08-09 ENCOUNTER — Other Ambulatory Visit (HOSPITAL_COMMUNITY): Payer: Self-pay

## 2023-08-09 ENCOUNTER — Other Ambulatory Visit: Payer: Self-pay

## 2023-08-10 ENCOUNTER — Other Ambulatory Visit: Payer: Self-pay

## 2023-08-11 ENCOUNTER — Other Ambulatory Visit (HOSPITAL_COMMUNITY): Payer: Self-pay

## 2023-08-11 ENCOUNTER — Other Ambulatory Visit: Payer: Self-pay

## 2023-08-22 DIAGNOSIS — F331 Major depressive disorder, recurrent, moderate: Secondary | ICD-10-CM | POA: Diagnosis not present

## 2023-08-25 DIAGNOSIS — I739 Peripheral vascular disease, unspecified: Secondary | ICD-10-CM | POA: Diagnosis not present

## 2023-08-25 DIAGNOSIS — I25119 Atherosclerotic heart disease of native coronary artery with unspecified angina pectoris: Secondary | ICD-10-CM | POA: Diagnosis not present

## 2023-08-25 DIAGNOSIS — I10 Essential (primary) hypertension: Secondary | ICD-10-CM | POA: Diagnosis not present

## 2023-08-27 DIAGNOSIS — J84112 Idiopathic pulmonary fibrosis: Secondary | ICD-10-CM | POA: Diagnosis not present

## 2023-08-28 DIAGNOSIS — I739 Peripheral vascular disease, unspecified: Secondary | ICD-10-CM | POA: Diagnosis not present

## 2023-08-28 DIAGNOSIS — E7800 Pure hypercholesterolemia, unspecified: Secondary | ICD-10-CM | POA: Diagnosis not present

## 2023-08-28 DIAGNOSIS — I10 Essential (primary) hypertension: Secondary | ICD-10-CM | POA: Diagnosis not present

## 2023-08-28 DIAGNOSIS — F418 Other specified anxiety disorders: Secondary | ICD-10-CM | POA: Diagnosis not present

## 2023-08-28 DIAGNOSIS — I25119 Atherosclerotic heart disease of native coronary artery with unspecified angina pectoris: Secondary | ICD-10-CM | POA: Diagnosis not present

## 2023-08-28 DIAGNOSIS — F322 Major depressive disorder, single episode, severe without psychotic features: Secondary | ICD-10-CM | POA: Diagnosis not present

## 2023-08-28 DIAGNOSIS — C3411 Malignant neoplasm of upper lobe, right bronchus or lung: Secondary | ICD-10-CM | POA: Diagnosis not present

## 2023-08-30 ENCOUNTER — Ambulatory Visit: Attending: Cardiology | Admitting: Physician Assistant

## 2023-08-30 VITALS — BP 114/76 | HR 66 | Ht 72.0 in | Wt 178.8 lb

## 2023-08-30 DIAGNOSIS — J84112 Idiopathic pulmonary fibrosis: Secondary | ICD-10-CM

## 2023-08-30 DIAGNOSIS — E785 Hyperlipidemia, unspecified: Secondary | ICD-10-CM

## 2023-08-30 DIAGNOSIS — I25810 Atherosclerosis of coronary artery bypass graft(s) without angina pectoris: Secondary | ICD-10-CM

## 2023-08-30 DIAGNOSIS — M7989 Other specified soft tissue disorders: Secondary | ICD-10-CM

## 2023-08-30 DIAGNOSIS — I2782 Chronic pulmonary embolism: Secondary | ICD-10-CM | POA: Diagnosis not present

## 2023-08-30 NOTE — Progress Notes (Signed)
 Cardiology Office Note   Date:  08/30/2023  ID:  Duey, Liller 1950/10/11, MRN [id number] PCP: Rexanne Ingle, MD  [facility] Providers Cardiologist:  Darryle ONEIDA Decent, MD     History of Present Illness DARLY FAILS is a 73 y.o. male with past medical history of PAD, CAD s/p CABG x 1 in 1989 with SVG to RCA, DVT/PE of the Eliquis , interstitial lung disease, idiopathic pulmonary fibrosis on 5L [place], stage Ia lung adenocarcinoma s/p resection and hyperlipidemia.  He had a history of popliteal stent placed in 2013 and moderate disease in the left SFA.  Last cardiac catheterization in 2022 for exertional chest pain and dyspnea showed CTO of ostial RCA with patent SVG-RCA.  Medical therapy was recommended.  He was admitted in March 2023 with submassive PE with heart strain and left peroneal vein DVT.  Echocardiogram obtained on 05/19/2021 showed EF of 60 to 65%, no regional wall motion abnormality, mildly enlarged RV.  His Imdur  was discontinued due to orthostasis.  He has been placed on pirfenidone  in May 2023 for his pulmonary condition. He is not on aspirin  given the need for Eliquis .   I last saw the patient on 08/18/2023 for intermittent chest discomfort.  Her symptom is worse with physical activity and alleviated by resting.  EKG showed new T wave inversion in the inferior leads.  We recommended the patient to undergo cardiac catheterization.  He underwent planned cardiac catheterization on 08/04/2023, unfortunately his SVG-RCA is down.  There was no good target for revascularization.  Medical therapy was recommended.  LVEDP was elevated at 24 mmHg.  Patient presents today for posthospital follow-up accompanied by wife.  He has been able to walk 100 yards without any exertional chest pain.  He has been taking Lasix  daily for the past 2 weeks.  This likely explain why his weight is 9 pounds lower than the previous weight.  He has no lower extremity edema on exam.  I will obtain a basic  metabolic panel, if renal function worsens, may have to change the Lasix  back to as needed.  Otherwise, he can follow-up with Dr. Decent in 4 to 6 months.  ROS:   He denies chest pain, palpitations, dyspnea, pnd, orthopnea, n, v, dizziness, syncope, edema, weight gain, or early satiety. All other systems reviewed and are otherwise negative except as noted above.    Studies Reviewed EKG Interpretation Date/Time:  Wednesday August 30 2023 14:33:57 EDT Ventricular Rate:  66 PR Interval:  178 QRS Duration:  94 QT Interval:  402 QTC Calculation: 421 R Axis:   94  Text Interpretation: Normal sinus rhythm Rightward axis Inferior infarct (cited on or before 30-Aug-2023) When compared with ECG of 01-Aug-2023 09:41, T wave inversion more evident in Inferior leads Confirmed by Janene Boer [phone]) on 08/30/2023 3:20:08 PM    Cardiac Studies & Procedures   ______________________________________________________________________________________________ CARDIAC CATHETERIZATION  CARDIAC CATHETERIZATION 08/04/2023  Conclusion Images from the original result were not included. Coronary and bypass graft angiography 08/04/2023: LM: Normal LAD: No significant disease Lcx: No significant disease RCA: Proximal occlusion Minimal left-to-right collaterals ro RPDA SVG-RCA: Proximal occlusion (New since previous cath in 2022)  LVEDP 24 mmHg    Angina likely from CTO of RCA and SVG-RCA. RCA not a good target for revascularization given the chronicity and lack of visualization of distal vessel. SVG-RCA not a good target for revascularization given the age of the graft, known ectasia noted on cath in 2022. Overall, medical management likely  only option. Uptitrate anti anginal management as tolerated. Elevated LVEDP, consider diuretics.  Newman JINNY Lawrence, MD  Findings Coronary Findings Diagnostic  Dominance: Right  Left Main Vessel is moderate in size. Vessel is angiographically normal.  Left Anterior  Descending Vessel is moderate in size. Vessel is angiographically normal.  First Diagonal Branch Vessel is moderate in size.  Second Diagonal Branch Vessel is small in size.  Third Diagonal Branch Vessel is small in size.  Left Circumflex Vessel is moderate in size. Vessel is angiographically normal.  First Obtuse Marginal Branch Vessel is moderate in size.  Second Obtuse Marginal Branch Vessel is small in size.  Third Obtuse Marginal Branch Vessel is moderate in size.  Right Coronary Artery Vessel is large. Ost RCA to Prox RCA lesion is 100% stenosed. The lesion is chronically occluded.  Right Posterior Descending Artery Vessel is large in size.  Right Posterior Atrioventricular Artery Vessel is large in size.  Saphenous Graft To Dist RCA SVG and is large. Origin to Prox Graft lesion is 100% stenosed. The lesion is eccentric. Previously placed Mid Graft stent of unknown type is  widely patent.  Intervention  No interventions have been documented.   CARDIAC CATHETERIZATION  CARDIAC CATHETERIZATION 10/05/2020  Conclusion Conclusions: Severe single-vessel coronary artery disease with chronic total occlusion of the ostial RCA.  No significant disease noted in the left coronary artery. Patent SVG-RCA with patent mid-graft stent and moderate proximal/mid graft disease that is stable or minimally worse compared to prior catheterization in 2012. Normal left and right heart filling pressures. Low normal Fick cardiac output/index.  Recommendations: Continue aggressive secondary prevention. Escalate medical therapy and consider further evaluation for alternative causes of shortness of breath and chest pain.  Lonni Hanson, MD [facility]  Findings Coronary Findings Diagnostic  Dominance: Right  Left Main Vessel is moderate in size. The vessel exhibits minimal luminal irregularities.  Left Anterior Descending Vessel is moderate in size. Vessel is  angiographically normal.  First Diagonal Branch Vessel is moderate in size.  Second Diagonal Branch Vessel is small in size.  Third Diagonal Branch Vessel is small in size.  Left Circumflex Vessel is moderate in size. Vessel is angiographically normal.  First Obtuse Marginal Branch Vessel is moderate in size.  Second Obtuse Marginal Branch Vessel is small in size.  Third Obtuse Marginal Branch Vessel is moderate in size.  Right Coronary Artery Vessel is large. Ost RCA to Prox RCA lesion is 100% stenosed. The lesion is chronically occluded.  Right Posterior Descending Artery Vessel is large in size.  Right Posterior Atrioventricular Artery Vessel is large in size.  Saphenous Graft To Dist RCA SVG graft was visualized by angiography and is large. Prox Graft lesion is 60% stenosed. The lesion is eccentric. Mid Graft-1 lesion is 50% stenosed. The lesion is eccentric. Previously placed Mid Graft-2 stent (unknown type) is  widely patent.  Intervention  No interventions have been documented.     ECHOCARDIOGRAM  ECHOCARDIOGRAM COMPLETE 05/19/2021  Narrative ECHOCARDIOGRAM REPORT    Patient Name:   ZAKHARI FOGEL Date of Exam: 05/19/2021 Medical Rec #:  [id number]        Height:       72.0 in Accession #:    [id number]       Weight:       208.3 lb Date of Birth:  1950-04-27        BSA:          2.168 m Patient Age:    50  years         BP:           00/00 mmHg Patient Gender: M                HR:           67 bpm. Exam Location:  Inpatient  Procedure: 2D Echo and Intracardiac Opacification Agent  Indications:    Pericardial effusion  History:        Patient has prior history of Echocardiogram examinations, most recent 10/04/2020. CAD; Risk Factors:Hypertension.  Sonographer:    Elida Casey Referring Phys: [id number] ALLISON WOLFE  IMPRESSIONS   1. Left ventricular ejection fraction, by estimation, is 60 to 65%. The left ventricle has normal function.  The left ventricle has no regional wall motion abnormalities. Left ventricular diastolic parameters are indeterminate. 2. Right ventricular systolic function is normal. The right ventricular size is mildly enlarged. Tricuspid regurgitation signal is inadequate for assessing PA pressure. 3. A small pericardial effusion is present. There is no evidence of cardiac tamponade. 4. The mitral valve is grossly normal. No evidence of mitral valve regurgitation. No evidence of mitral stenosis. 5. The aortic valve is tricuspid. Aortic valve regurgitation is not visualized. No aortic stenosis is present.  FINDINGS Left Ventricle: Left ventricular ejection fraction, by estimation, is 60 to 65%. The left ventricle has normal function. The left ventricle has no regional wall motion abnormalities. The left ventricular internal cavity size was normal in size. There is no left ventricular hypertrophy. Left ventricular diastolic parameters are indeterminate.  Right Ventricle: The right ventricular size is mildly enlarged. No increase in right ventricular wall thickness. Right ventricular systolic function is normal. Tricuspid regurgitation signal is inadequate for assessing PA pressure.  Left Atrium: Left atrial size was normal in size.  Right Atrium: Right atrial size was normal in size.  Pericardium: A small pericardial effusion is present. There is no evidence of cardiac tamponade.  Mitral Valve: The mitral valve is grossly normal. No evidence of mitral valve regurgitation. No evidence of mitral valve stenosis.  Tricuspid Valve: The tricuspid valve is grossly normal. Tricuspid valve regurgitation is not demonstrated. No evidence of tricuspid stenosis.  Aortic Valve: The aortic valve is tricuspid. Aortic valve regurgitation is not visualized. No aortic stenosis is present. Aortic valve peak gradient measures 5.2 mmHg.  Pulmonic Valve: The pulmonic valve was grossly normal. Pulmonic valve regurgitation is not  visualized. No evidence of pulmonic stenosis.  Aorta: The aortic root and ascending aorta are structurally normal, with no evidence of dilitation.  Venous: The inferior vena cava was not well visualized.  IAS/Shunts: The atrial septum is grossly normal.   LEFT VENTRICLE PLAX 2D LVIDd:         4.85 cm   Diastology LVIDs:         3.55 cm   LV e' medial:    3.57 cm/s LV PW:         1.50 cm   LV E/e' medial:  16.2 LV IVS:        1.30 cm   LV e' lateral:   5.70 cm/s LVOT diam:     2.10 cm   LV E/e' lateral: 10.1 LV SV:         74 LV SV Index:   34 LVOT Area:     3.46 cm   RIGHT VENTRICLE            IVC RV Basal diam:  2.80 cm    IVC  diam: 2.40 cm RV S prime:     9.62 cm/s TAPSE (M-mode): 1.5 cm  LEFT ATRIUM             Index        RIGHT ATRIUM           Index LA diam:        4.60 cm 2.12 cm/m   RA Area:     18.70 cm LA Vol (A2C):   37.5 ml 17.30 ml/m  RA Volume:   46.90 ml  21.63 ml/m LA Vol (A4C):   44.2 ml 20.39 ml/m LA Biplane Vol: 40.8 ml 18.82 ml/m AORTIC VALVE                 PULMONIC VALVE AV Area (Vmax): 3.11 cm     PV Vmax:       0.69 m/s AV Vmax:        113.50 cm/s  PV Peak grad:  1.9 mmHg AV Peak Grad:   5.2 mmHg LVOT Vmax:      102.00 cm/s LVOT Vmean:     59.700 cm/s LVOT VTI:       0.215 m  AORTA Ao Root diam: 3.85 cm Ao Asc diam:  3.30 cm  MITRAL VALVE MV Area (PHT): 3.81 cm    SHUNTS MV Decel Time: 199 msec    Systemic VTI:  0.22 m MV E velocity: 57.80 cm/s  Systemic Diam: 2.10 cm MV A velocity: 67.70 cm/s MV E/A ratio:  0.85  Darryle Decent MD Electronically signed by Darryle Decent MD Signature Date/Time: 05/19/2021/12:59:14 PM    Final          ______________________________________________________________________________________________      Risk Assessment/Calculations           Physical Exam VS:  BP 114/76   Pulse 66   Ht 6' (1.829 m)   Wt 178 lb 12.8 oz (81.1 kg)   SpO2 95%   BMI 24.25 kg/m        Wt Readings from  Last 3 Encounters:  08/30/23 178 lb 12.8 oz (81.1 kg)  08/07/23 187 lb 9.6 oz (85.1 kg)  08/04/23 188 lb (85.3 kg)    GEN: Well nourished, well developed in no acute distress NECK: No JVD; No carotid bruits CARDIAC: RRR, no murmurs, rubs, gallops RESPIRATORY:  Clear to auscultation without rales, wheezing or rhonchi  ABDOMEN: Soft, non-tender, non-distended EXTREMITIES:  No edema; No deformity   ASSESSMENT AND PLAN  CAD s/p CABG: Recent cardiac catheterization shows his SVG-RCA is down since last cath in 2022, medical therapy was recommended.  His chest pain has resolved.  Will continue on the current therapy.  Leg swelling: Although not listed on his medication list, his wife mentions his PCP has prescribed him some Lasix  before for leg swelling.  He was instructed to take Lasix  as needed, in the past 2 weeks, he has been taking Lasix  on a daily basis.  I recommend a basic metabolic panel to make sure his renal function is able to tolerate daily dosing of Lasix .  History of PE: On Eliquis   Idiopathic pulmonary fibrosis: On oxygen  therapy at home  Hyperlipidemia: On atorvastatin         Dispo: Follow-up with Dr. Decent in 4 to 6 months.  Signed, Scot Ford, PA

## 2023-08-30 NOTE — Patient Instructions (Signed)
 Medication Instructions:  NO CHANGES *If you need a refill on your cardiac medications before your next appointment, please call your pharmacy*  Lab Work: BMET TODAY If you have labs (blood work) drawn today and your tests are completely normal, you will receive your results only by: MyChart Message (if you have MyChart) OR A paper copy in the mail If you have any lab test that is abnormal or we need to change your treatment, we will call you to review the results.  Testing/Procedures: NO TESTING  Follow-Up: At [facility], you and your health needs are our priority.  As part of our continuing mission to provide you with exceptional heart care, our providers are all part of one team.  This team includes your primary Cardiologist (physician) and Advanced Practice Providers or APPs (Physician Assistants and Nurse Practitioners) who all work together to provide you with the care you need, when you need it.  Your next appointment:   4-6 month(s)  Provider:   Darryle ONEIDA Decent, MD

## 2023-08-31 ENCOUNTER — Other Ambulatory Visit: Payer: Self-pay | Admitting: Physician Assistant

## 2023-08-31 ENCOUNTER — Telehealth: Payer: Self-pay | Admitting: Physician Assistant

## 2023-08-31 ENCOUNTER — Ambulatory Visit: Payer: Self-pay | Admitting: Physician Assistant

## 2023-08-31 ENCOUNTER — Other Ambulatory Visit (HOSPITAL_COMMUNITY): Payer: Self-pay

## 2023-08-31 LAB — BASIC METABOLIC PANEL WITH GFR
BUN/Creatinine Ratio: 14 (ref 10–24)
BUN: 13 mg/dL (ref 8–27)
CO2: 26 mmol/L (ref 20–29)
Calcium: 9.9 mg/dL (ref 8.6–10.2)
Chloride: 100 mmol/L (ref 96–106)
Creatinine, Ser: 0.92 mg/dL (ref 0.76–1.27)
Glucose: 102 mg/dL — ABNORMAL HIGH (ref 70–99)
Potassium: 4.9 mmol/L (ref 3.5–5.2)
Sodium: 144 mmol/L (ref 134–144)
eGFR: 87 mL/min/{1.73_m2} (ref >59)

## 2023-08-31 MED ORDER — FUROSEMIDE 20 MG PO TABS: 20.0000 mg | ORAL_TABLET | ORAL | Status: DC | PRN

## 2023-08-31 NOTE — Telephone Encounter (Addendum)
 Called patient regarding lasix  dosage per wife patient is on lasix  20 mg daily but patient hasn't taken medication in the last couple days due to weight loss, would like to know if patient should start back taking or is he okay not taking medication, informed patient I would past message along to provider ----- Message from Scot Ford sent at 08/31/2023  8:07 AM EDT ----- Kidney function very much stable. Not sure what dose of lasix  Richard Parrish has been taking at home but his kidney is tolerating the current dose. Please remind Richard Parrish to confirm the dosage of  lasix  so we can document in our system.  ----- Message ----- From: Rebecka Memos Lab Results In Sent: 08/31/2023   1:35 AM EDT To: Hao Meng, PA

## 2023-08-31 NOTE — Telephone Encounter (Signed)
 I spoke with the patient's wife regarding Lasix .  He has been taking Lasix  for the past 2 weeks at 20 mg daily even though it was initially prescribed as needed.  She is lower extremity edema has resolved.  At this time, we ultimately decided for him to go back to as needed dose of 20 mg Lasix .

## 2023-09-04 NOTE — Telephone Encounter (Signed)
 I spoke with Richard Parrish wife last week via phone call and ultimately decided to change his lasix  20mg   to PRN.

## 2023-09-05 ENCOUNTER — Other Ambulatory Visit: Payer: Self-pay

## 2023-09-05 NOTE — Progress Notes (Signed)
 Specialty Pharmacy Refill Coordination Note  Richard Parrish is a 73 y.o. male contacted today regarding refills of specialty medication(s) Pirfenidone    Patient requested Delivery   Delivery date: 09/07/23   Verified address: [address] [place], [place] [address]   Medication will be filled on 09/06/23.

## 2023-09-25 ENCOUNTER — Ambulatory Visit (HOSPITAL_COMMUNITY)
Admission: RE | Admit: 2023-09-25 | Discharge: 2023-09-25 | Disposition: A | Discharge status: Home / Self Care | Source: Ambulatory Visit | Attending: Physician Assistant | Admitting: Physician Assistant

## 2023-09-25 ENCOUNTER — Inpatient Hospital Stay: Payer: PPO | Attending: Internal Medicine

## 2023-09-25 DIAGNOSIS — C3490 Malignant neoplasm of unspecified part of unspecified bronchus or lung: Secondary | ICD-10-CM | POA: Diagnosis not present

## 2023-09-25 DIAGNOSIS — C3411 Malignant neoplasm of upper lobe, right bronchus or lung: Secondary | ICD-10-CM | POA: Insufficient documentation

## 2023-09-25 DIAGNOSIS — I700 Atherosclerosis of aorta: Secondary | ICD-10-CM | POA: Diagnosis not present

## 2023-09-25 DIAGNOSIS — J90 Pleural effusion, not elsewhere classified: Secondary | ICD-10-CM | POA: Diagnosis not present

## 2023-09-25 LAB — CBC WITH DIFFERENTIAL (CANCER CENTER ONLY)
Abs Immature Granulocytes: 0.02 K/uL (ref 0.00–0.07)
Basophils Absolute: 0.1 K/uL (ref 0.0–0.1)
Basophils Relative: 1 %
Eosinophils Absolute: 0.3 K/uL (ref 0.0–0.5)
Eosinophils Relative: 6 %
HCT: 39.2 % (ref 39.0–52.0)
Hemoglobin: 13.1 g/dL (ref 13.0–17.0)
Immature Granulocytes: 0 %
Lymphocytes Relative: 6 %
Lymphs Abs: 0.3 K/uL — ABNORMAL LOW (ref 0.7–4.0)
MCH: 32 pg (ref 26.0–34.0)
MCHC: 33.4 g/dL (ref 30.0–36.0)
MCV: 95.6 fL (ref 80.0–100.0)
Monocytes Absolute: 0.7 K/uL (ref 0.1–1.0)
Monocytes Relative: 15 %
Neutro Abs: 3.2 K/uL (ref 1.7–7.7)
Neutrophils Relative %: 72 %
Platelet Count: 233 K/uL (ref 150–400)
RBC: 4.1 MIL/uL — ABNORMAL LOW (ref 4.22–5.81)
RDW: 12.7 % (ref 11.5–15.5)
WBC Count: 4.6 K/uL (ref 4.0–10.5)
nRBC: 0 % (ref 0.0–0.2)

## 2023-09-25 LAB — CMP (CANCER CENTER ONLY)
ALT: 13 U/L (ref 0–44)
AST: 20 U/L (ref 15–41)
Albumin: 4 g/dL (ref 3.5–5.0)
Alkaline Phosphatase: 97 U/L (ref 38–126)
Anion gap: 5 (ref 5–15)
BUN: 13 mg/dL (ref 8–23)
CO2: 31 mmol/L (ref 22–32)
Calcium: 9.1 mg/dL (ref 8.9–10.3)
Chloride: 105 mmol/L (ref 98–111)
Creatinine: 0.82 mg/dL (ref 0.61–1.24)
GFR, Estimated: >60 mL/min (ref >60)
Glucose, Bld: 99 mg/dL (ref 70–99)
Potassium: 4.1 mmol/L (ref 3.5–5.1)
Sodium: 141 mmol/L (ref 135–145)
Total Bilirubin: 0.5 mg/dL (ref 0.0–1.2)
Total Protein: 6.7 g/dL (ref 6.5–8.1)

## 2023-09-25 MED ORDER — IOHEXOL 300 MG/ML  SOLN
75.0000 mL | Freq: Once | INTRAMUSCULAR | Status: AC | PRN
Start: 2023-09-25 — End: 2023-09-25
  Administered 2023-09-25: 75 mL via INTRAVENOUS

## 2023-09-26 DIAGNOSIS — J84112 Idiopathic pulmonary fibrosis: Secondary | ICD-10-CM | POA: Diagnosis not present

## 2023-09-28 ENCOUNTER — Other Ambulatory Visit (HOSPITAL_COMMUNITY): Payer: Self-pay

## 2023-10-02 ENCOUNTER — Inpatient Hospital Stay: Payer: PPO | Attending: Internal Medicine | Admitting: Internal Medicine

## 2023-10-02 VITALS — BP 103/69 | HR 68 | Temp 97.3°F | Resp 16 | Ht 72.0 in | Wt 178.0 lb

## 2023-10-02 DIAGNOSIS — Z902 Acquired absence of lung [part of]: Secondary | ICD-10-CM | POA: Insufficient documentation

## 2023-10-02 DIAGNOSIS — Z85118 Personal history of other malignant neoplasm of bronchus and lung: Secondary | ICD-10-CM | POA: Diagnosis not present

## 2023-10-02 DIAGNOSIS — C3490 Malignant neoplasm of unspecified part of unspecified bronchus or lung: Secondary | ICD-10-CM

## 2023-10-02 NOTE — Progress Notes (Signed)
 [facility] [phone]   Fax:([phone]  OFFICE PROGRESS NOTE  Rexanne Ingle, MD 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 Shyrl on March 03, 2021.  CURRENT THERAPY: Observation.  INTERVAL HISTORY: Richard Parrish 73 y.o. male returns to the clinic today for follow-up visit accompanied by his wife.Discussed the use of AI scribe software for clinical note transcription with the patient, who gave verbal consent to proceed.  History of Present Illness Richard Parrish is a 73 year old male with stage IA non-small cell lung cancer who presents for evaluation and repeat CT scan. He is accompanied by his wife.  He was diagnosed with stage IA non-small cell lung cancer in January 2023 and underwent a right upper lobectomy and wedge resection of the right lower lobe. Since then, he has been on observation.  He has a history of interstitial lung disease for which he uses home oxygen . He feels better and is starting to engage in more outdoor activities, including lifting objects, and mentions that he is 'getting stronger every day.'  He also has psoriasis and is under the care of a dermatologist. He experiences itching on his hands and notes that his medication for fibrosis, (Esbriet ), makes him allergic to the sun.  No nausea, vomiting, or diarrhea. He mentions having a heart problem, which he states is being managed. Recent blood work was reported as fine.     MEDICAL HISTORY: Past Medical History:  Diagnosis Date   Anxiety    Arthritis    lower back (04/07/2017)   BPH (benign prostatic hypertrophy)    C. difficile  diarrhea    Chronic lower back pain    Coronary artery disease 1989   cabg with dvg to rca    Depression    GERD (gastroesophageal reflux disease)    History of kidney stones    Hyperlipidemia    Hypertension    Insomnia    Lung cancer (HCC)    Myocardial infarction (HCC) 2002   On home oxygen  therapy    OSA on CPAP    cpap setting of 60 per pt   Osteoarthritis    hips (04/07/2017)   PAD (peripheral artery disease) (HCC)    Peripheral vascular disease (HCC)    Pneumonia 1989   S/P CABG   Pulmonary embolism (HCC) 05/18/2021   Pulmonary fibrosis (HCC)    one time (04/07/2017)   Spinal stenosis     ALLERGIES:  is allergic to ambien [zolpidem tartrate] and metoprolol .  MEDICATIONS:  Current Outpatient Medications  Medication Sig Dispense Refill   acetaminophen  (TYLENOL ) 500 MG tablet Take 1 tablet (500 mg total) by mouth every 6 (six) hours as needed for mild pain.     ALPRAZolam  (XANAX ) 0.5 MG tablet Take 0.5-1 tablets (0.25-0.5 mg total) by mouth 3 (three) times daily as needed for anxiety or sleep. (Patient taking differently: Take 0.5 mg by mouth 4 (four) times daily as needed for anxiety or sleep.) 5 tablet 0   antiseptic oral rinse (BIOTENE) LIQD 15 mLs by Mouth Rinse route as needed for dry mouth or mouth pain.     apixaban  (ELIQUIS ) 5 MG TABS  tablet Take 1 tablet (5 mg total) by mouth 2 (two) times daily. Resume 08/04/2023 afternoon 180 tablet 1   atorvastatin  (LIPITOR ) 80 MG tablet Take 1 tablet (80 mg total) by mouth daily. 90 tablet 3   budesonide  (ENTOCORT EC ) 3 MG 24 hr capsule Take 3 mg by mouth daily.     carvedilol  (COREG ) 6.25 MG tablet Take 1 tablet (6.25 mg total) by mouth 2 (two) times daily. 180 tablet 3   cholecalciferol  (VITAMIN D3) 25 MCG (1000 UNIT) tablet Take 1,000 Units by mouth 3 (three) times a week.     diphenhydrAMINE  (BENADRYL ) 25 mg capsule Take 25 mg by mouth daily as needed (sinus drainage).     furosemide  (LASIX ) 20 MG tablet Take 1 tablet (20  mg total) by mouth as needed.     guaiFENesin  (MUCINEX ) 600 MG 12 hr tablet Take 1 tablet (600 mg total) by mouth 2 (two) times daily as needed. (Patient taking differently: Take 600 mg by mouth 2 (two) times daily as needed for to loosen phlegm.)     HYDROcodone -acetaminophen  (NORCO/VICODIN) 5-325 MG tablet Take 1 tablet by mouth 5 (five) times daily.     hyoscyamine  (LEVSIN , ANASPAZ ) 0.125 MG tablet Take 0.125 mg by mouth every 4 (four) hours as needed for cramping.      methocarbamol  (ROBAXIN ) 750 MG tablet Take 750 mg by mouth every 8 (eight) hours as needed for muscle spasms.     nitroGLYCERIN  (NITROSTAT ) 0.4 MG SL tablet Place 1 tablet (0.4 mg total) under the tongue every 5 (five) minutes as needed for chest pain. 25 tablet 8   OXYGEN  Inhale 5 L/min into the lungs continuous.     pantoprazole  (PROTONIX ) 40 MG tablet TAKE 1 TABLET BY MOUTH EVERY DAY BEFORE BREAKFAST 90 tablet 2   PARoxetine  (PAXIL ) 20 MG tablet Take 20 mg by mouth daily.     Pirfenidone  801 MG TABS Take 1 tablet (801 mg total) by mouth 3 (three) times daily with meals. 270 tablet 1   sodium chloride  (OCEAN) 0.65 % SOLN nasal spray Place 1 spray into both nostrils daily as needed for congestion.     tamsulosin  (FLOMAX ) 0.4 MG CAPS capsule Take 0.4 mg by mouth in the morning and at bedtime.     traMADol  (ULTRAM ) 50 MG tablet Take 1 tablet (50 mg total) by mouth every 6 (six) hours as needed. 15 tablet 0   triamcinolone  cream (KENALOG ) 0.1 % Apply 1 Application topically 2 (two) times daily as needed (for itching).     No current facility-administered medications for this visit.    SURGICAL HISTORY:  Past Surgical History:  Procedure Laterality Date   ABDOMINAL AORTAGRAM N/A 03/15/2011   Procedure: ABDOMINAL AORTAGRAM;  Surgeon: Gaile LELON New, MD;  Location: [facility] CATH LAB;  Service: Cardiovascular;  Laterality: N/A;   ABDOMINAL AORTAGRAM N/A 04/12/2011   Procedure: ABDOMINAL EZELLA;  Surgeon: Gaile LELON New, MD;   Location: [facility] CATH LAB;  Service: Cardiovascular;  Laterality: N/A;   BACK SURGERY     BRONCHIAL BIOPSY  03/03/2021   Procedure: BRONCHIAL BIOPSIES;  Surgeon: Brenna Adine CROME, DO;  Location: [facility] ENDOSCOPY;  Service: Pulmonary;;   BRONCHIAL BRUSHINGS  03/03/2021   Procedure: BRONCHIAL BRUSHINGS;  Surgeon: Brenna Adine CROME, DO;  Location: [facility] ENDOSCOPY;  Service: Pulmonary;;   BRONCHIAL NEEDLE ASPIRATION BIOPSY  03/03/2021   Procedure: BRONCHIAL NEEDLE ASPIRATION BIOPSIES;  Surgeon: Brenna Adine CROME, DO;  Location: [facility] ENDOSCOPY;  Service: Pulmonary;;   CARDIAC CATHETERIZATION  08/1987   CORONARY ANGIOPLASTY WITH STENT PLACEMENT  ~ [facility], [place]   CORONARY ARTERY BYPASS GRAFT  09/1987   CABG X1;  [facility]; [place]; RCA   CYSTOSCOPY/URETEROSCOPY/HOLMIUM LASER/STENT PLACEMENT Right 03/14/2023   Procedure: CYSTOSCOPY, URETEROSCOPY, HOLMIUM LASER, STENT PLACEMENT;  Surgeon: Elisabeth Valli BIRCH, MD;  Location: [facility] ORS;  Service: Urology;  Laterality: Right;  60 MINUTE CASE   FIDUCIAL MARKER PLACEMENT  03/03/2021   Procedure: FIDUCIAL DYE MARKING;  Surgeon: Brenna Adine CROME, DO;  Location: [facility] ENDOSCOPY;  Service: Pulmonary;;   INTERCOSTAL NERVE BLOCK  03/03/2021   Procedure: INTERCOSTAL NERVE BLOCK;  Surgeon: Shyrl Linnie KIDD, MD;  Location: [facility] OR;  Service: Thoracic;;   JOINT REPLACEMENT     KNEE ARTHROSCOPY Left    LAPAROSCOPIC CHOLECYSTECTOMY     LEFT HEART CATH AND CORONARY ANGIOGRAPHY N/A 08/04/2023   Procedure: LEFT HEART CATH AND CORONARY ANGIOGRAPHY;  Surgeon: Elmira Newman PARAS, MD;  Location: [facility] INVASIVE CV LAB;  Service: Cardiovascular;  Laterality: N/A;   LYMPH NODE BIOPSY  03/03/2021   Procedure: LYMPH NODE BIOPSY;  Surgeon: Shyrl Linnie KIDD, MD;  Location: [facility] OR;  Service: Thoracic;;   LYMPH NODE DISSECTION  03/03/2021   Procedure: LYMPH NODE DISSECTION;  Surgeon: Shyrl Linnie KIDD, MD;  Location: [facility] OR;  Service: Thoracic;;   PERIPHERAL VASCULAR INTERVENTION  Left 2016   put a stent; right branch of left femoral   POSTERIOR LUMBAR FUSION  2009   lumbar fusion, S L 5 to L 4   RIGHT/LEFT HEART CATH AND CORONARY/GRAFT ANGIOGRAPHY N/A 10/05/2020   Procedure: RIGHT/LEFT HEART CATH AND CORONARY/GRAFT ANGIOGRAPHY;  Surgeon: Mady Bruckner, MD;  Location: [facility] INVASIVE CV LAB;  Service: Cardiovascular;  Laterality: N/A;   SHOULDER ARTHROSCOPY W/ ROTATOR CUFF REPAIR Right 2006   SHOULDER ARTHROSCOPY WITH SUBACROMIAL DECOMPRESSION AND OPEN ROTATOR C Left ~ 1983   TONSILLECTOMY AND ADENOIDECTOMY     TOTAL HIP ARTHROPLASTY Right 2012   TOTAL HIP ARTHROPLASTY Left 04/07/2017   TOTAL HIP ARTHROPLASTY Left 04/07/2017   Procedure: LEFT TOTAL HIP ARTHROPLASTY;  Surgeon: Shari Sieving, MD;  Location: [facility] OR;  Service: Orthopedics;  Laterality: Left;   VIDEO BRONCHOSCOPY WITH RADIAL ENDOBRONCHIAL ULTRASOUND  03/03/2021   Procedure: VIDEO BRONCHOSCOPY WITH RADIAL ENDOBRONCHIAL ULTRASOUND;  Surgeon: Brenna Adine CROME, DO;  Location: [facility] ENDOS[facility];  Service: Pulmonary;;    REVIEW OF SYSTEMS:  A comprehensive review of systems was negative except for: Respiratory: positive for dyspnea on exertion Integument/breast: positive for pruritus and rash   PHYSICAL EXAMINATION: General appearance: alert, cooperative, and no distress Head: Normocephalic, without obvious abnormality, atraumatic Neck: no adenopathy, no JVD, supple, symmetrical, trachea midline, and thyroid  not enlarged, symmetric, no tenderness/mass/nodules Lymph nodes: Cervical, supraclavicular, and axillary nodes normal. Resp: clear to auscultation bilaterally Back: symmetric, no curvature. ROM normal. No CVA tenderness. Cardio: regular rate and rhythm, S1, S2 normal, no murmur, click, rub or gallop GI: soft, non-tender; bowel sounds normal; no masses,  no organomegaly Extremities: extremities normal, atraumatic, no cyanosis or edema  ECOG PERFORMANCE STATUS: 1 - Symptomatic but completely ambulatory  Blood  pressure 103/69, pulse 68, temperature (!) 97.3 F (36.3 C), temperature source Temporal, resp. rate 16, height 6' (1.829 m), weight 178 lb (80.7 kg), SpO2 100%.  LABORATORY DATA: Lab Results  Component Value Date   WBC 4.6 09/25/2023   HGB 13.1 09/25/2023   HCT 39.2 09/25/2023   MCV 95.6 09/25/2023   PLT 233 09/25/2023  Chemistry      Component Value Date/Time   NA 141 09/25/2023 0921   NA 144 08/30/2023 1532   K 4.1 09/25/2023 0921   CL 105 09/25/2023 0921   CO2 31 09/25/2023 0921   BUN 13 09/25/2023 0921   BUN 13 08/30/2023 1532   CREATININE 0.82 09/25/2023 0921      Component Value Date/Time   CALCIUM  9.1 09/25/2023 0921   ALKPHOS 97 09/25/2023 0921   AST 20 09/25/2023 0921   ALT 13 09/25/2023 0921   BILITOT 0.5 09/25/2023 0921       RADIOGRAPHIC STUDIES: CT Chest W Contrast Result Date: 09/25/2023 CLINICAL DATA:  Non-small cell lung cancer, follow-up. * Tracking Code: BO * EXAM: CT CHEST WITH CONTRAST TECHNIQUE: Multidetector CT imaging of the chest was performed during intravenous contrast administration. RADIATION DOSE REDUCTION: This exam was performed according to the departmental dose-optimization program which includes automated exposure control, adjustment of the mA and/or kV according to patient size and/or use of iterative reconstruction technique. CONTRAST:  75mL OMNIPAQUE  IOHEXOL  300 MG/ML  SOLN COMPARISON:  Chest CT March 20, 2023 FINDINGS: Cardiovascular: Aortic atherosclerosis. Prior median sternotomy and CABG. Enlarged main pulmonary artery. Borderline cardiac enlargement. Mediastinum/Nodes: No suspicious thyroid  nodule. Stable mediastinal lymph nodes for instance a subcarinal lymph node measuring 12 mm in short axis on image 78/2. Similar appearance of the soft tissue thickening along the right hilum. 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 Stable juxtapleural  pulmonary nodules for instance in the left lower lobe measuring 10 mm on image 125/6, unchanged. Stable small right pleural fluid effusion/thickening. Upper Abdomen: No acute abnormality. Musculoskeletal: No aggressive lytic or blastic lesion of bone. Remote left-sided rib fractures. Multilevel degenerative changes spine with bridging anterior vertebral osteophytes. Surgical fixation anchors in the right humeral head. IMPRESSION: 1. Stable examination without evidence of new or progressive disease in the chest. 2. Stable juxtapleural pulmonary nodules measuring up to 10 mm. 3. Stable mediastinal lymph nodes. 4. 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 5. Small right pleural fluid effusion/thickening. 6. Aortic atherosclerosis. Aortic Atherosclerosis (ICD10-I70.0). Electronically Signed   By: Reyes Holder M.D.   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 Shyrl on March 03, 2021. The patient is currently on observation and feeling fine except for the baseline shortness of breath and skin rash from psoriasis. He had repeat CT scan of the chest performed recently.  I personally and independently reviewed the scan and discussed the result with the patient and his wife.  His scan showed no concerning findings for disease progression. Assessment and Plan Assessment & Plan Stage IA non-small cell lung cancer, post right upper lobectomy and wedge resection, under surveillance Stage IA non-small cell lung cancer, status post right upper lobectomy and wedge resection of the right lower lobe, diagnosed in  January 2023. Currently under surveillance with no evidence of disease progression on recent chest CT scan. He is doing well clinically and reports feeling better with increased activity levels. - Continue annual surveillance with chest CT scan. - Schedule follow-up  appointment in one year for oncology evaluation. He was advised to call immediately if he has any other concerning symptoms in the interval. The patient voices understanding of current disease status and treatment options and is in agreement with the current care plan.  All questions were answered. The patient knows to call the clinic with any problems, questions or concerns. We can certainly see the patient much sooner if necessary.  The total time spent in the appointment was 20 minutes.  Disclaimer: This note was dictated with voice recognition software. Similar sounding words can inadvertently be transcribed and may not be corrected upon review.

## 2023-10-04 DIAGNOSIS — L560 Drug phototoxic response: Secondary | ICD-10-CM | POA: Diagnosis not present

## 2023-10-04 DIAGNOSIS — L2989 Other pruritus: Secondary | ICD-10-CM | POA: Diagnosis not present

## 2023-10-05 ENCOUNTER — Other Ambulatory Visit: Payer: Self-pay

## 2023-10-05 NOTE — Progress Notes (Signed)
 Specialty Pharmacy Refill Coordination Note  Richard Parrish is a 73 y.o. male contacted today regarding refills of specialty medication(s) Pirfenidone    Patient requested Delivery   Delivery date: 10/19/23   Verified address: [address] [place], [place] [address]   Medication will be filled on 10/18/23.

## 2023-10-10 ENCOUNTER — Other Ambulatory Visit: Payer: Self-pay

## 2023-10-10 MED ORDER — CARVEDILOL 6.25 MG PO TABS: 6.2500 mg | ORAL_TABLET | Freq: Two times a day (BID) | ORAL | 3 refills | Status: DC

## 2023-10-18 ENCOUNTER — Other Ambulatory Visit: Payer: Self-pay

## 2023-10-27 DIAGNOSIS — J84112 Idiopathic pulmonary fibrosis: Secondary | ICD-10-CM | POA: Diagnosis not present

## 2023-10-29 DIAGNOSIS — E7800 Pure hypercholesterolemia, unspecified: Secondary | ICD-10-CM | POA: Diagnosis not present

## 2023-10-29 DIAGNOSIS — F322 Major depressive disorder, single episode, severe without psychotic features: Secondary | ICD-10-CM | POA: Diagnosis not present

## 2023-10-29 DIAGNOSIS — F418 Other specified anxiety disorders: Secondary | ICD-10-CM | POA: Diagnosis not present

## 2023-10-29 DIAGNOSIS — C3411 Malignant neoplasm of upper lobe, right bronchus or lung: Secondary | ICD-10-CM | POA: Diagnosis not present

## 2023-10-31 DIAGNOSIS — M545 Low back pain, unspecified: Secondary | ICD-10-CM | POA: Diagnosis not present

## 2023-10-31 DIAGNOSIS — Z981 Arthrodesis status: Secondary | ICD-10-CM | POA: Diagnosis not present

## 2023-10-31 DIAGNOSIS — Z1330 Encounter for screening examination for mental health and behavioral disorders, unspecified: Secondary | ICD-10-CM | POA: Diagnosis not present

## 2023-10-31 DIAGNOSIS — G8929 Other chronic pain: Secondary | ICD-10-CM | POA: Diagnosis not present

## 2023-10-31 DIAGNOSIS — G894 Chronic pain syndrome: Secondary | ICD-10-CM | POA: Diagnosis not present

## 2023-10-31 DIAGNOSIS — M791 Myalgia, unspecified site: Secondary | ICD-10-CM | POA: Diagnosis not present

## 2023-11-13 ENCOUNTER — Other Ambulatory Visit (HOSPITAL_COMMUNITY): Payer: Self-pay

## 2023-11-15 ENCOUNTER — Other Ambulatory Visit: Payer: Self-pay

## 2023-11-15 NOTE — Progress Notes (Signed)
 Specialty Pharmacy Refill Coordination Note  Richard Parrish is a 73 y.o. male contacted today regarding refills of specialty medication(s) Pirfenidone    Patient requested Delivery   Delivery date: 11/24/23   Verified address: [address] [place], [place] [address]   Medication will be filled on 11/23/23.

## 2023-11-18 DIAGNOSIS — I739 Peripheral vascular disease, unspecified: Secondary | ICD-10-CM | POA: Diagnosis not present

## 2023-11-18 DIAGNOSIS — I10 Essential (primary) hypertension: Secondary | ICD-10-CM | POA: Diagnosis not present

## 2023-11-18 DIAGNOSIS — I25119 Atherosclerotic heart disease of native coronary artery with unspecified angina pectoris: Secondary | ICD-10-CM | POA: Diagnosis not present

## 2023-11-21 DIAGNOSIS — F331 Major depressive disorder, recurrent, moderate: Secondary | ICD-10-CM | POA: Diagnosis not present

## 2023-11-27 DIAGNOSIS — J84112 Idiopathic pulmonary fibrosis: Secondary | ICD-10-CM | POA: Diagnosis not present

## 2023-11-28 DIAGNOSIS — I739 Peripheral vascular disease, unspecified: Secondary | ICD-10-CM | POA: Diagnosis not present

## 2023-11-28 DIAGNOSIS — F322 Major depressive disorder, single episode, severe without psychotic features: Secondary | ICD-10-CM | POA: Diagnosis not present

## 2023-11-28 DIAGNOSIS — C3411 Malignant neoplasm of upper lobe, right bronchus or lung: Secondary | ICD-10-CM | POA: Diagnosis not present

## 2023-11-28 DIAGNOSIS — I25119 Atherosclerotic heart disease of native coronary artery with unspecified angina pectoris: Secondary | ICD-10-CM | POA: Diagnosis not present

## 2023-11-28 DIAGNOSIS — E7800 Pure hypercholesterolemia, unspecified: Secondary | ICD-10-CM | POA: Diagnosis not present

## 2023-11-28 DIAGNOSIS — I10 Essential (primary) hypertension: Secondary | ICD-10-CM | POA: Diagnosis not present

## 2023-11-28 DIAGNOSIS — F418 Other specified anxiety disorders: Secondary | ICD-10-CM | POA: Diagnosis not present

## 2023-11-29 DIAGNOSIS — I270 Primary pulmonary hypertension: Secondary | ICD-10-CM | POA: Diagnosis not present

## 2023-11-29 DIAGNOSIS — E7800 Pure hypercholesterolemia, unspecified: Secondary | ICD-10-CM | POA: Diagnosis not present

## 2023-11-29 DIAGNOSIS — I739 Peripheral vascular disease, unspecified: Secondary | ICD-10-CM | POA: Diagnosis not present

## 2023-11-29 DIAGNOSIS — J8410 Pulmonary fibrosis, unspecified: Secondary | ICD-10-CM | POA: Diagnosis not present

## 2023-11-29 DIAGNOSIS — Z23 Encounter for immunization: Secondary | ICD-10-CM | POA: Diagnosis not present

## 2023-11-29 DIAGNOSIS — C3411 Malignant neoplasm of upper lobe, right bronchus or lung: Secondary | ICD-10-CM | POA: Diagnosis not present

## 2023-11-29 DIAGNOSIS — L309 Dermatitis, unspecified: Secondary | ICD-10-CM | POA: Diagnosis not present

## 2023-11-29 DIAGNOSIS — I10 Essential (primary) hypertension: Secondary | ICD-10-CM | POA: Diagnosis not present

## 2023-11-29 DIAGNOSIS — F322 Major depressive disorder, single episode, severe without psychotic features: Secondary | ICD-10-CM | POA: Diagnosis not present

## 2023-11-29 DIAGNOSIS — I25119 Atherosclerotic heart disease of native coronary artery with unspecified angina pectoris: Secondary | ICD-10-CM | POA: Diagnosis not present

## 2023-11-29 DIAGNOSIS — Z902 Acquired absence of lung [part of]: Secondary | ICD-10-CM | POA: Diagnosis not present

## 2023-11-29 DIAGNOSIS — J849 Interstitial pulmonary disease, unspecified: Secondary | ICD-10-CM | POA: Diagnosis not present

## 2023-12-12 ENCOUNTER — Other Ambulatory Visit: Payer: Self-pay | Admitting: Emergency Medicine

## 2023-12-12 DIAGNOSIS — Z7189 Other specified counseling: Secondary | ICD-10-CM | POA: Diagnosis not present

## 2023-12-12 DIAGNOSIS — I270 Primary pulmonary hypertension: Secondary | ICD-10-CM | POA: Diagnosis not present

## 2023-12-12 DIAGNOSIS — Z23 Encounter for immunization: Secondary | ICD-10-CM | POA: Diagnosis not present

## 2023-12-12 DIAGNOSIS — I25119 Atherosclerotic heart disease of native coronary artery with unspecified angina pectoris: Secondary | ICD-10-CM | POA: Diagnosis not present

## 2023-12-12 DIAGNOSIS — J849 Interstitial pulmonary disease, unspecified: Secondary | ICD-10-CM | POA: Diagnosis not present

## 2023-12-12 DIAGNOSIS — F1020 Alcohol dependence, uncomplicated: Secondary | ICD-10-CM | POA: Diagnosis not present

## 2023-12-14 ENCOUNTER — Other Ambulatory Visit: Payer: Self-pay

## 2023-12-18 DIAGNOSIS — I25119 Atherosclerotic heart disease of native coronary artery with unspecified angina pectoris: Secondary | ICD-10-CM | POA: Diagnosis not present

## 2023-12-18 DIAGNOSIS — I739 Peripheral vascular disease, unspecified: Secondary | ICD-10-CM | POA: Diagnosis not present

## 2023-12-18 DIAGNOSIS — I10 Essential (primary) hypertension: Secondary | ICD-10-CM | POA: Diagnosis not present

## 2023-12-19 ENCOUNTER — Other Ambulatory Visit (HOSPITAL_COMMUNITY): Payer: Self-pay

## 2023-12-21 ENCOUNTER — Other Ambulatory Visit: Payer: Self-pay

## 2023-12-21 ENCOUNTER — Other Ambulatory Visit (HOSPITAL_COMMUNITY): Payer: Self-pay

## 2023-12-21 NOTE — Progress Notes (Signed)
 Specialty Pharmacy Refill Coordination Note  Richard Parrish is a 73 y.o. male contacted today regarding refills of specialty medication(s) Pirfenidone    Patient requested Delivery   Delivery date: 12/26/23   Verified address: [address] [place], [place] [address]   Medication will be filled on 12/25/23.

## 2023-12-25 ENCOUNTER — Other Ambulatory Visit: Payer: Self-pay

## 2023-12-27 DIAGNOSIS — J84112 Idiopathic pulmonary fibrosis: Secondary | ICD-10-CM | POA: Diagnosis not present

## 2023-12-29 ENCOUNTER — Other Ambulatory Visit: Payer: Self-pay

## 2023-12-29 DIAGNOSIS — F418 Other specified anxiety disorders: Secondary | ICD-10-CM | POA: Diagnosis not present

## 2023-12-29 DIAGNOSIS — I10 Essential (primary) hypertension: Secondary | ICD-10-CM | POA: Diagnosis not present

## 2023-12-29 DIAGNOSIS — I25119 Atherosclerotic heart disease of native coronary artery with unspecified angina pectoris: Secondary | ICD-10-CM | POA: Diagnosis not present

## 2023-12-29 DIAGNOSIS — F322 Major depressive disorder, single episode, severe without psychotic features: Secondary | ICD-10-CM | POA: Diagnosis not present

## 2023-12-29 DIAGNOSIS — C3411 Malignant neoplasm of upper lobe, right bronchus or lung: Secondary | ICD-10-CM | POA: Diagnosis not present

## 2023-12-29 DIAGNOSIS — E7800 Pure hypercholesterolemia, unspecified: Secondary | ICD-10-CM | POA: Diagnosis not present

## 2023-12-29 DIAGNOSIS — I739 Peripheral vascular disease, unspecified: Secondary | ICD-10-CM | POA: Diagnosis not present

## 2023-12-29 NOTE — Progress Notes (Signed)
 Specialty Pharmacy Ongoing Clinical Assessment Note  Richard Parrish is a 73 y.o. male who is being followed by the specialty pharmacy service for RxSp Interstitial Lung Disease   Patient's specialty medication(s) reviewed today: Pirfenidone    Missed doses in the last 4 weeks: 0   Patient/Caregiver did not have any additional questions or concerns.   Therapeutic benefit summary: Patient is achieving benefit   Adverse events/side effects summary: Experienced adverse events/side effects (Sun sensitivity/rash - tries to keep skin covered)   Patient's therapy is appropriate to: Continue    Goals Addressed             This Visit's Progress    Maintain optimal adherence to therapy   On track    Patient is on track. Patient will maintain adherence and adhere to provider and/or lab appointments         Follow up: 12 months  [facility] Specialty Pharmacist

## 2024-01-02 NOTE — Progress Notes (Unsigned)
 Cardiology Office Note:  .   Date:  01/04/2024  ID:  Richard Parrish, DOB 1950/09/28, MRN [id number] PCP: Rexanne Ingle, MD  [facility] Providers Cardiologist:  Darryle ONEIDA Decent, MD  History of Present Illness: .    Chief Complaint  Patient presents with   Follow-up    Richard Parrish is a 73 y.o. male with history of CAD, PAD, HTN, HLD, PE who presents for follow-up.   History of Present Illness   Richard Parrish is a 73 year old male with coronary artery disease status post CABG who presents for follow-up.    LHC 08/04/2023 with occluded SVG to RCA and CTO RCA with collaterals.   He recently underwent cardiac testing which showed stability in his condition. One artery is completely blocked at the top but remains open with a stent at the bottom. No new stents were placed, and his condition is managed with medication. No symptoms of angina are present.  He has a history of Raynaud's phenomenon, causing his fingers to become cold. His oxygen  levels are usually in the 90s at home, and he does not experience shortness of breath. He is a mouth breather. He remains active, engaging in activities such as walking a quarter mile, some of which is uphill, and working on equipment. He reports no limitations in his activities and is involved in deer hunting. He plans to start exercising at a facility in [place], [place], where he will be supervised and have access to oxygen .  He is currently on carvedilol  and has a history of Raynaud's symptoms. He was previously on toprol , which was stopped after he developed a rash. He is also on Lipitor  80 mg for cholesterol management, with his most recent LDL being 71. He takes Eliquis  for a history of pulmonary embolism and is not on aspirin .  He mentions experiencing some weight loss, which he attributes to a combination of factors. No chest pain is present, and his blood pressure is not running too low at home. He does not take Lasix  as  his swelling has decreased.          Problem List CAD -CABG x 1 (SVG-RCA) -occluded SVG-RCA; 100% RCA 08/04/2023 2. PAD -L SFA/popliteal stent 2013 -50-74% L SFA 3. HLD -T chol 134, HDL 49, LDL 71, TG 70 4. HTN 5. Stage IA Lung Adenocarcinoma -s/p resection  6. ILD/IPF 7. DVT/PE    ROS: All other ROS reviewed and negative. Pertinent positives noted in the HPI.     Studies Reviewed: Richard Parrish       LHC 08/04/2023 Coronary and bypass graft angiography 08/04/2023: LM: Normal LAD: No significant disease Lcx: No significant disease RCA: Proximal occlusion          Minimal left-to-right collaterals ro RPDA SVG-RCA: Proximal occlusion (New since previous cath in 2022) Physical Exam:   VS:  BP 98/66 (BP Location: Left Arm, Patient Position: Sitting, Cuff Size: Normal)   Pulse 68   Ht 6' (1.829 m)   Wt 160 lb (72.6 kg)   SpO2 99%   BMI 21.70 kg/m    Wt Readings from Last 3 Encounters:  01/04/24 160 lb (72.6 kg)  10/02/23 178 lb (80.7 kg)  08/30/23 178 lb 12.8 oz (81.1 kg)    GEN: Well nourished, well developed in no acute distress NECK: No JVD; No carotid bruits CARDIAC: RRR, no murmurs, rubs, gallops RESPIRATORY:  Clear to auscultation without rales, wheezing or rhonchi  ABDOMEN: Soft, non-tender, non-distended EXTREMITIES:  No edema; No deformity  ASSESSMENT AND PLAN: .   Assessment and Plan    Coronary artery disease status post CABG with occluded RCA vein graft, managed medically, without angina Coronary artery disease with occluded RCA vein graft, CTO RCA. Managed medically. No angina.  - Continue current medical management for coronary artery disease.  Raynaud's syndrome exacerbated by beta blocker therapy Raynaud's syndrome exacerbated by carvedilol . Symptoms include cold fingers and purple discoloration of feet. Carvedilol  suspected to worsen symptoms. - Instructed to stop carvedilol  by taking a half tablet for three days and then discontinue. - Monitor blood pressure  after stopping carvedilol .  Hypertension Blood pressure slightly low today. Previously on carvedilol , which is being discontinued due to Raynaud's syndrome. - Monitor blood pressure after stopping carvedilol .  Hyperlipidemia Managed with Lipitor . Recent LDL level is 71, close to goal. - Continue Lipitor  80 mg.  Peripheral artery disease with stable residual left femoral artery stenosis Peripheral artery disease with stable residual stenosis in the left femoral artery (50-74%). Well-managed with yearly arterial duplexes and follow-up with vascular surgery. - Continue follow-up with vascular surgery.  History of pulmonary embolism on anticoagulation Pulmonary embolism managed with Eliquis . No current issues reported. - Continue Eliquis .              Follow-up: Return in about 1 year (around 01/03/2025).  Signed, Darryle DASEN. Barbaraann, MD, [facility]  [facility]  [address] [place], [place] [address] [phone]  11:20 AM

## 2024-01-04 ENCOUNTER — Ambulatory Visit: Attending: Cardiovascular Disease | Admitting: Cardiovascular Disease

## 2024-01-04 ENCOUNTER — Encounter: Payer: Self-pay | Admitting: Cardiovascular Disease

## 2024-01-04 VITALS — BP 98/66 | HR 68 | Ht 72.0 in | Wt 160.0 lb

## 2024-01-04 DIAGNOSIS — I25810 Atherosclerosis of coronary artery bypass graft(s) without angina pectoris: Secondary | ICD-10-CM

## 2024-01-04 DIAGNOSIS — E785 Hyperlipidemia, unspecified: Secondary | ICD-10-CM | POA: Diagnosis not present

## 2024-01-04 DIAGNOSIS — I7300 Raynaud's syndrome without gangrene: Secondary | ICD-10-CM | POA: Diagnosis not present

## 2024-01-04 MED ORDER — CARVEDILOL 6.25 MG PO TABS: 3.1250 mg | ORAL_TABLET | Freq: Two times a day (BID) | ORAL | Status: AC

## 2024-01-04 NOTE — Patient Instructions (Signed)
 Medication Instructions:  Your physician has recommended you make the following change in your medication:  DECREASE THEN STOP: Carvedilol  (Coreg ) 3.125 mg for three days then stop.  *If you need a refill on your cardiac medications before your next appointment, please call your pharmacy*   Follow-Up: At [facility], you and your health needs are our priority.  As part of our continuing mission to provide you with exceptional heart care, our providers are all part of one team.  This team includes your primary Cardiologist (physician) and Advanced Practice Providers or APPs (Physician Assistants and Nurse Practitioners) who all work together to provide you with the care you need, when you need it.  Your next appointment:   1 year(s)  Provider:   One of our Advanced Practice Providers (APPs): Morse Clause, PA-C  Lamarr Satterfield, NP Miriam Shams, NP  Olivia Pavy, PA-C Josefa Beauvais, NP  Leontine Salen, PA-C Orren Fabry, PA-C  [place], PA-C Ernest Dick, NP  Damien Braver, NP Jon Hails, PA-C  Waddell Donath, PA-C    Dayna Dunn, PA-C  Scott Weaver, PA-C Lum Louis, NP Katlyn West, NP Callie Goodrich, PA-C  Xika Zhao, NP Sheng Haley, PA-C    Kathleen Johnson, PA-C

## 2024-01-10 ENCOUNTER — Other Ambulatory Visit: Payer: Self-pay | Admitting: Cardiovascular Disease

## 2024-01-17 ENCOUNTER — Other Ambulatory Visit: Payer: Self-pay | Admitting: Surgery

## 2024-01-17 DIAGNOSIS — I25119 Atherosclerotic heart disease of native coronary artery with unspecified angina pectoris: Secondary | ICD-10-CM | POA: Diagnosis not present

## 2024-01-17 DIAGNOSIS — I10 Essential (primary) hypertension: Secondary | ICD-10-CM | POA: Diagnosis not present

## 2024-01-17 DIAGNOSIS — I70212 Atherosclerosis of native arteries of extremities with intermittent claudication, left leg: Secondary | ICD-10-CM

## 2024-01-17 DIAGNOSIS — I739 Peripheral vascular disease, unspecified: Secondary | ICD-10-CM | POA: Diagnosis not present

## 2024-01-18 ENCOUNTER — Other Ambulatory Visit: Payer: Self-pay

## 2024-01-22 ENCOUNTER — Other Ambulatory Visit: Payer: Self-pay

## 2024-01-22 NOTE — Progress Notes (Signed)
 Specialty Pharmacy Refill Coordination Note  Richard Parrish is a 73 y.o. male contacted today regarding refills of specialty medication(s) Pirfenidone    Patient requested Delivery   Delivery date: 01/29/24   Verified address: [address] [place], [place] [address]   Medication will be filled on: 01/26/24

## 2024-01-28 DIAGNOSIS — F322 Major depressive disorder, single episode, severe without psychotic features: Secondary | ICD-10-CM | POA: Diagnosis not present

## 2024-01-28 DIAGNOSIS — I739 Peripheral vascular disease, unspecified: Secondary | ICD-10-CM | POA: Diagnosis not present

## 2024-01-28 DIAGNOSIS — C3411 Malignant neoplasm of upper lobe, right bronchus or lung: Secondary | ICD-10-CM | POA: Diagnosis not present

## 2024-01-28 DIAGNOSIS — E7800 Pure hypercholesterolemia, unspecified: Secondary | ICD-10-CM | POA: Diagnosis not present

## 2024-01-28 DIAGNOSIS — I25119 Atherosclerotic heart disease of native coronary artery with unspecified angina pectoris: Secondary | ICD-10-CM | POA: Diagnosis not present

## 2024-01-28 DIAGNOSIS — I10 Essential (primary) hypertension: Secondary | ICD-10-CM | POA: Diagnosis not present

## 2024-01-28 DIAGNOSIS — F418 Other specified anxiety disorders: Secondary | ICD-10-CM | POA: Diagnosis not present

## 2024-01-30 DIAGNOSIS — Z981 Arthrodesis status: Secondary | ICD-10-CM | POA: Diagnosis not present

## 2024-01-30 DIAGNOSIS — G894 Chronic pain syndrome: Secondary | ICD-10-CM | POA: Diagnosis not present

## 2024-01-30 DIAGNOSIS — G8929 Other chronic pain: Secondary | ICD-10-CM | POA: Diagnosis not present

## 2024-01-30 DIAGNOSIS — M545 Low back pain, unspecified: Secondary | ICD-10-CM | POA: Diagnosis not present

## 2024-02-06 ENCOUNTER — Encounter: Payer: Self-pay | Admitting: Pulmonary Disease

## 2024-02-06 ENCOUNTER — Ambulatory Visit: Admitting: *Deleted

## 2024-02-06 ENCOUNTER — Ambulatory Visit: Admitting: Pulmonary Disease

## 2024-02-06 VITALS — BP 100/64 | HR 73 | Temp 97.6°F | Ht 72.0 in | Wt 162.0 lb

## 2024-02-06 DIAGNOSIS — I2699 Other pulmonary embolism without acute cor pulmonale: Secondary | ICD-10-CM

## 2024-02-06 DIAGNOSIS — C3411 Malignant neoplasm of upper lobe, right bronchus or lung: Secondary | ICD-10-CM

## 2024-02-06 DIAGNOSIS — J84112 Idiopathic pulmonary fibrosis: Secondary | ICD-10-CM

## 2024-02-06 DIAGNOSIS — J849 Interstitial pulmonary disease, unspecified: Secondary | ICD-10-CM

## 2024-02-06 DIAGNOSIS — Z5181 Encounter for therapeutic drug level monitoring: Secondary | ICD-10-CM

## 2024-02-06 LAB — PULMONARY FUNCTION TEST
DL/VA % pred: 62 %
DL/VA: 2.45 ml/min/mmHg/L
DLCO cor % pred: 42 %
DLCO cor: 11.29 ml/min/mmHg
DLCO unc % pred: 42 %
DLCO unc: 11.29 ml/min/mmHg
FEF 25-75 Post: 1.89 L/s
FEF 25-75 Pre: 1.52 L/s
FEF2575-%Change-Post: 24 %
FEF2575-%Pred-Post: 76 %
FEF2575-%Pred-Pre: 61 %
FEV1-%Change-Post: 7 %
FEV1-%Pred-Post: 68 %
FEV1-%Pred-Pre: 64 %
FEV1-Post: 2.32 L
FEV1-Pre: 2.16 L
FEV1FVC-%Change-Post: 9 %
FEV1FVC-%Pred-Pre: 99 %
FEV6-%Change-Post: -2 %
FEV6-%Pred-Post: 66 %
FEV6-%Pred-Pre: 68 %
FEV6-Post: 2.91 L
FEV6-Pre: 2.97 L
FEV6FVC-%Pred-Post: 106 %
FEV6FVC-%Pred-Pre: 106 %
FVC-%Change-Post: -2 %
FVC-%Pred-Post: 63 %
FVC-%Pred-Pre: 64 %
FVC-Post: 2.91 L
FVC-Pre: 2.97 L
Post FEV1/FVC ratio: 80 %
Post FEV6/FVC ratio: 100 %
Pre FEV1/FVC ratio: 73 %
Pre FEV6/FVC Ratio: 100 %
RV % pred: 119 %
RV: 3.16 L
TLC % pred: 81 %
TLC: 6.03 L

## 2024-02-06 NOTE — Patient Instructions (Signed)
  VISIT SUMMARY: You had a follow-up visit to discuss your pulmonary fibrosis and overall health. Your breathing remains stable, and you are staying active with regular exercise. We discussed your weight loss and appetite, as well as your medication regimen.  YOUR PLAN: IDIOPATHIC PULMONARY FIBROSIS: Your lung condition is stable, though there is a slight decrease in diffusion capacity, which is expected with disease progression. Your recent CT scan showed no significant changes. -Continue taking Esbriet  and Eliquis  as prescribed. -Maintain a balanced diet with adequate protein intake. -Continue regular physical activity as tolerated.  PULMONARY HYPERTENSION (SUSPECTED): There is a suspicion of pulmonary hypertension due to the reduction in diffusion capacity and stable CT findings. This condition can result from scarring in the lungs, leading to increased strain and pressure on the heart. -An echocardiogram has been ordered to assess for pulmonary hypertension. -If the echocardiogram is abnormal, we will consult with a cardiologist and consider a right heart catheterization.

## 2024-02-06 NOTE — Progress Notes (Signed)
 [address]              LYNX GOODRICH    [id number]    12-15-1950  Primary Care Physician:Polite, Tanda, MD  Referring Physician: Rexanne Tanda, MD 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  with history of pulmonary fibrosis, GERD, hypertension, hyperlipidemia, pulmonary fibrosis He was diagnosed with unspecified pulmonary fibrosis and was seen in pulmonary clinic in 2015  Developed COVID-19 in March 2021 and reports worsening dyspnea since then.  Complains of chronic dyspnea on exertion, cough.  No fevers or chills  Imaging showed right upper lobe mass and he underwent combined bronchoscopy with Dr. Brenna and subsequently right upper lobe robotic resection by Dr. Shyrl on 03/03/2021.  Saw Dr. Sherrod from oncology on 03/24/2021 and no further chemotherapy recommended  Postsurgery he had recurrent issues with pneumothorax and exacerbation of IPF Treated in Jan with levofloxacin  and prednisone . He had needle decompression and chest tube placed with Heimlich valve and mini VAC for pneumothorax.  Hospitalized on 2/26 under the care of cardiothoracic surgery.  Discharged on amoxicillin  and additional prednisone  taper.  Hospitalized in March 2023 with submassive pulmonary embolism, left lower extremity DVT.  Started on Eliquis .  He was also started on prednisone  for ILD exacerbation Started Esbriet  in May 2023.  So far is tolerating it well without any problem. He has tapered off prednisone  in September 2023 Finished pulmonary rehab in September 2023.  Developed photosensitivity rash to Esbriet  in 2024.  He took a break from the medication for a week and resumed it in early July 2024 .  Overall he feels that the rash is improving and has consulted with a  dermatologist.  Interim history: Discussed the use of AI scribe software for clinical note transcription with the patient, who gave verbal consent to proceed.  History of Present Illness  Braedon Sjogren is a 73 year old male with pulmonary fibrosis who presents for follow-up of his lung condition.  Dyspnea and exercise tolerance - Breathing remains stable. - Able to walk dogs and exercise regularly. - Occasional need to rest during activity due to leg fatigue. - Recently joined a fitness program to improve conditioning. - Remains active with walking, gym workouts, swinging a golf club, and assisting with deer hunting.  Pulmonary function and imaging - Recent lung function tests showed decreased diffusion capacity. - CT chest from July 2025 demonstrated stable findings.  Weight loss and appetite - Lost approximately fifty pounds over two years. - Weight has remained above 160 pounds. - Reduced appetite following surgery and Esbriet  use.  Medication use and side effects - Takes Esbriet  for pulmonary fibrosis without missed doses. - Takes Eliquis  for prior blood clot. - Carvedilol  was discontinued by cardiologist due to Raynaud's phenomena. - Reduced appetite attributed in part to Esbriet  use.  Cardiac evaluation - Echocardiogram completed in 2023.   Relevant pulmonary history Pets: 3 cats Occupation: Used to work in patent examiner at [employer] Exposures: Exposed to [employer] carrier. Smoking history: 30-pack-year smoker.  Quit in 1989.  Currently vapes which helps with his back. Travel history: No significant recent travel history Relevant family history: No family history of lung disease  Outpatient Encounter Medications as of 02/06/2024  Medication Sig   acetaminophen  (TYLENOL ) 500 MG tablet  Take 1 tablet (500 mg total) by mouth every 6 (six) hours as needed for mild pain.   ALPRAZolam  (XANAX ) 0.5 MG tablet Take 0.5-1 tablets (0.25-0.5 mg total) by mouth  3 (three) times daily as needed for anxiety or sleep. (Patient taking differently: Take 0.5 mg by mouth 4 (four) times daily as needed for anxiety or sleep.)   antiseptic oral rinse (BIOTENE) LIQD 15 mLs by Mouth Rinse route as needed for dry mouth or mouth pain.   apixaban  (ELIQUIS ) 5 MG TABS tablet Take 1 tablet (5 mg total) by mouth 2 (two) times daily. Resume 08/04/2023 afternoon   atorvastatin  (LIPITOR ) 80 MG tablet TAKE 1 TABLET BY MOUTH EVERY DAY   cholecalciferol  (VITAMIN D3) 25 MCG (1000 UNIT) tablet Take 1,000 Units by mouth 3 (three) times a week.   diphenhydrAMINE  (BENADRYL ) 25 mg capsule Take 25 mg by mouth daily as needed (sinus drainage).   guaiFENesin  (MUCINEX ) 600 MG 12 hr tablet Take 1 tablet (600 mg total) by mouth 2 (two) times daily as needed. (Patient taking differently: Take 600 mg by mouth 2 (two) times daily as needed. As needed)   HYDROcodone -acetaminophen  (NORCO/VICODIN) 5-325 MG tablet Take 1 tablet by mouth 5 (five) times daily.   hyoscyamine  (LEVSIN , ANASPAZ ) 0.125 MG tablet Take 0.125 mg by mouth every 4 (four) hours as needed for cramping.    methocarbamol  (ROBAXIN ) 750 MG tablet Take 750 mg by mouth every 8 (eight) hours as needed for muscle spasms.   nitroGLYCERIN  (NITROSTAT ) 0.4 MG SL tablet Place 1 tablet (0.4 mg total) under the tongue every 5 (five) minutes as needed for chest pain.   OXYGEN  Inhale 5 L/min into the lungs continuous.   pantoprazole  (PROTONIX ) 40 MG tablet TAKE 1 TABLET BY MOUTH EVERY DAY BEFORE BREAKFAST   Pirfenidone  801 MG TABS Take 1 tablet (801 mg total) by mouth 3 (three) times daily with meals.   sodium chloride  (OCEAN) 0.65 % SOLN nasal spray Place 1 spray into both nostrils daily as needed for congestion.   tamsulosin  (FLOMAX ) 0.4 MG CAPS capsule Take 0.4 mg by mouth in the morning and at bedtime.   triamcinolone  cream (KENALOG ) 0.1 % Apply 1 Application topically 2 (two) times daily as needed (for itching).   carvedilol  (COREG ) 6.25 MG  tablet Take 0.5 tablets (3.125 mg total) by mouth 2 (two) times daily for 3 days. (Patient not taking: Reported on 02/06/2024)   PARoxetine  (PAXIL ) 20 MG tablet Take 20 mg by mouth daily.   No facility-administered encounter medications on file as of 02/06/2024.   Vitals:   02/06/24 1349  BP: 100/64  Pulse: 73  Temp: 97.6 F (36.4 C)  Height: 6' (1.829 m)  Weight: 162 lb (73.5 kg)  SpO2: 97% Comment: on 5L of 02  TempSrc: Oral  BMI (Calculated): 21.97    Physical Exam GEN: No acute distress. CV: Regular rate and rhythm, no murmurs. LUNGS: Crackles heard on auscultation. SKIN JOINTS: Warm and dry, no rash.   Data Reviewed: Imaging: CT chest 07/18/2013-interstitial fibrosis with traction bronchiectasis with basilar predominance, emphysema.  Probable UIP pattern CT abdomen pelvis 02/07/17- basal pulmonary fibrosis. CT high-res 12/18/2020-new spiculated lung nodule in the right upper lobe PET scan 01/05/2021-uptake in the right upper lobe, equivocal uptake in the right hilum.  Left sphenoid sinus disease. Chest x-ray 04/01/2021-dense right lower lobe infiltrate CTA 04/13/2021-no PE, large right pneumothorax CTA 05/18/2021-left-sided pulmonary embolism, worsening groundglass opacities, airspace disease-right greater than left. High-resolution CT 07/13/21 - resolution of consolidation  and asbestos disease. Worsening pattern of pulmonary fibrosis in UIP pattern CT chest 09/17/2021-stable pattern of pulmonary fibrosis CT chest 03/17/2022-stable findings of pulmonary fibrosis. CT chest 09/12/2022-stable findings of pulmonary fibrosis. CT high-resolution 01/19/2023-patchy consolidation, groundglass opacities throughout posterior left upper lobe, left lower lobe, baseline extensive pulmonary fibrosis. CT chest 09/25/2023-stable findings of pulmonary fibrosis, small right pleural effusion.  Aortic atherosclerosis. I have reviewed the images personally  PFTs: 11/27/2020 FVC 4.03 [85%], FEV1 2.95  [84%], F/F 73, TLC 6.09 [81%], DLCO 20.01 [73%] Minimal diffusion defect.  02/06/2024 FVC 2.91 [63%], FEV1 2.32 [68%], F/F80, TLC 6.03 [81%], DLCO 11.29 [42%] Severe diffusion defect  Labs: CTD serologies 11/26/2020-P ANCA 1:80  Cardiac: Cardiac cath 10/05/2020-normal left and right heart filling pressures.  Single-vessel coronary artery disease  Echocardiogram 05/19/2021- LVEF 60-65%, RV systolic function is normal with mild enlargement.  TR is signal is inadequate for measuring PA pressure.  Assessment & Plan Idiopathic pulmonary fibrosis Has baseline pulmonary fibrosis which seems to be in UIP pattern. Elevation in ANCA is likely nonspecific as he has no signs of connective tissue disease.  I reviewed his biopsy with Dr. Rebbecca, lung pathologist who feels he has UIP fibrosis.  Overall presentation is consistent with IPF  Started Pirfenidone  in May 2023. Developed photosensitivity reaction in 2024 which appears to have resolved.  He occasionally has diarrhea which also improved after temporary stoppage of pirfenidone .  Well-managed lung volumes.  With decrease in diffusion capacity out of proportion to lung volumes will need to evaluate for pulmonary hypertension. No significant changes in CT scan from July 2025. Continues on pirfenidone  and Eliquis  without issues. Weight loss noted, but stable above 160 pounds. Engages in physical activity with some fatigue, possibly related to lung condition. - Continue pirfenidone  and Eliquis . - Encouraged balanced diet with adequate protein intake. - Encouraged regular physical activity as tolerated.  Pulmonary hypertension (suspected) Suspected pulmonary hypertension due to reduction in diffusion capacity and stable CT findings and lung volumes. Previous echocardiogram in 2023. Symptoms include fatigue and shortness of breath, but no significant swelling or other overt signs. Discussed potential for right heart catheterization if echocardiogram is  abnormal. Explained that pulmonary hypertension can result from scarring in the lungs, leading to increased strain and pressure on the heart. - Ordered echocardiogram to assess for pulmonary hypertension. - If echocardiogram is abnormal, will consult with cardiologist and consider right heart catheterization.   Right upper lobe lung adenocarcinoma S/p bronchoscopy and lobectomy Continue surveillance CT scan per oncology  Submassive PE Continue Eliquis   Plan/Recommendations: Continue pirfenidone , monitor labs Sun protection recommended while on the beach Echocardiogram  I personally spent a total of 42 minutes in the care of the patient today including preparing to see the patient, getting/reviewing separately obtained history, placing orders, referring and communicating with other health care professionals, documenting clinical information in the EHR, independently interpreting results, and communicating results.   Rock Sobol MD [facility] 02/06/2024, 2:02 PM  CC: Rexanne Ingle, MD    "

## 2024-02-06 NOTE — Progress Notes (Signed)
 Full PFT performed today.

## 2024-02-06 NOTE — Patient Instructions (Signed)
 Full PFT performed today.

## 2024-02-08 ENCOUNTER — Inpatient Hospital Stay: Admission: RE | Admit: 2024-02-08 | Discharge: 2024-02-08 | Attending: Pulmonary Disease

## 2024-02-08 DIAGNOSIS — J84112 Idiopathic pulmonary fibrosis: Secondary | ICD-10-CM

## 2024-02-09 LAB — ECHOCARDIOGRAM COMPLETE
AR max vel: 3.34 cm2
AV Area VTI: 3.23 cm2
AV Area mean vel: 3.18 cm2
AV Mean grad: 1.5 mmHg
AV Peak grad: 2.5 mmHg
Ao pk vel: 0.79 m/s
Area-P 1/2: 4.17 cm2
S' Lateral: 3.5 cm

## 2024-02-15 ENCOUNTER — Other Ambulatory Visit: Payer: Self-pay

## 2024-02-15 ENCOUNTER — Other Ambulatory Visit: Payer: Self-pay | Admitting: Pulmonary Disease

## 2024-02-15 DIAGNOSIS — J849 Interstitial pulmonary disease, unspecified: Secondary | ICD-10-CM

## 2024-02-15 NOTE — Telephone Encounter (Signed)
 Pt requesting refill of specialty medication - routing to Rx team to advise.

## 2024-02-16 ENCOUNTER — Other Ambulatory Visit: Payer: Self-pay

## 2024-02-16 MED ORDER — PIRFENIDONE 801 MG PO TABS
1.0000 | ORAL_TABLET | Freq: Three times a day (TID) | ORAL | 1 refills | Status: AC
  Filled 2024-02-16: qty 270, 90d supply, fill #0

## 2024-02-16 NOTE — Telephone Encounter (Signed)
 Refill sent for ESBRIET  to [facility]: [phone]   Dose: 801mg  by mouth three times daily    Last OV: 02/06/24 Provider: Dr. Theophilus Pertinent labs: LFT wnl July 2025  Next OV: due June 2026  Messaged patient to update labs.  Aleck Puls, PharmD, BCPS Clinical Pharmacist  [facility]

## 2024-02-20 ENCOUNTER — Other Ambulatory Visit: Payer: Self-pay

## 2024-02-20 NOTE — Progress Notes (Signed)
 Specialty Pharmacy Refill Coordination Note  Richard Parrish is a 73 y.o. male contacted today regarding refills of specialty medication(s) Pirfenidone    Patient requested Delivery   Delivery date: 02/26/24   Verified address: [address] [place], [place] [address]   Medication will be filled on: 02/23/24

## 2024-02-21 ENCOUNTER — Ambulatory Visit: Payer: Self-pay | Admitting: Pulmonary Disease

## 2024-02-23 ENCOUNTER — Other Ambulatory Visit: Payer: Self-pay

## 2024-03-04 ENCOUNTER — Ambulatory Visit

## 2024-03-04 ENCOUNTER — Ambulatory Visit (HOSPITAL_COMMUNITY)
Admission: RE | Admit: 2024-03-04 | Discharge: 2024-03-04 | Disposition: A | Discharge status: Home / Self Care | Source: Ambulatory Visit | Attending: Surgery | Admitting: Surgery

## 2024-03-04 ENCOUNTER — Ambulatory Visit (HOSPITAL_BASED_OUTPATIENT_CLINIC_OR_DEPARTMENT_OTHER)
Admission: RE | Admit: 2024-03-04 | Discharge: 2024-03-04 | Disposition: A | Discharge status: Home / Self Care | Source: Ambulatory Visit | Attending: Surgery | Admitting: Surgery

## 2024-03-04 VITALS — BP 124/81 | Ht 72.0 in | Wt 163.9 lb

## 2024-03-04 DIAGNOSIS — I70212 Atherosclerosis of native arteries of extremities with intermittent claudication, left leg: Secondary | ICD-10-CM

## 2024-03-04 DIAGNOSIS — I739 Peripheral vascular disease, unspecified: Secondary | ICD-10-CM | POA: Diagnosis present

## 2024-03-04 LAB — VAS US ABI WITH/WO TBI
Left ABI: 1.21
Right ABI: 1.09

## 2024-03-04 NOTE — Progress Notes (Signed)
 " HISTORY AND PHYSICAL     CC:  follow up. Requesting Provider:  Rexanne Ingle, MD  HPI: This is a 73 y.o. male who is here today for follow up for PAD.  Pt has hx of  atherectomy and stent placement of his left superficial femoral and popliteal artery for left leg claudication in 2017 by Dr.Brabham.  He experienced immediate symptomatic relief.   He underwent robotic lung resection for cancer in January 2023.   He is now on continuous O2 at home.  His wife states he also has pulmonary fibrosis and this is under control currently.    Pt was last seen 12/21/2022 and at that time, he was staying active, fishing, gardening.  He was not having any claudication, rest pain or ulcers.   The pt returns today for follow up.  He states that he walks without claudication.  He states he walks daily and gets around doing what he wants to do.  He denies any rest pain or non healing wounds.  He does have three dogs, and one of the golden retrievers stepped on his right great toe and is now scabbed.  It has not gotten any worse and he feels this is getting better.  He is compliant with his statin.  He is not on asa as he was taken off of this and put on Eliquis .    He does have some superficial varicosities/spider veins.  He reports these have never bled.   The pt is on a statin for cholesterol management.    The pt is not on an aspirin .    Other AC:  Eliquis  for LLE DVT in the past The pt is on BB for hypertension.  The pt is not on diabetic medication. Tobacco hx:  former  Pt does have family hx of AAA with his father.  Pt had CT a/p with contrast in 2018 revealing normal aortic caliber.   Past Medical History:  Diagnosis Date   Anxiety    Arthritis    lower back (04/07/2017)   BPH (benign prostatic hypertrophy)    C. difficile diarrhea    Chronic lower back pain    Coronary artery disease 1989   cabg with dvg to rca    Depression    GERD (gastroesophageal reflux disease)    History of kidney  stones    Hyperlipidemia    Hypertension    Insomnia    Lung cancer [facility]) January 28 2021   Myocardial infarction (HCC) 2002   On home oxygen  therapy    OSA on CPAP    cpap setting of 60 per pt   Osteoarthritis    hips (04/07/2017)   PAD (peripheral artery disease)    Peripheral vascular disease    Pneumonia 1989   S/P CABG   Pulmonary embolism (HCC) 05/18/2021   Pulmonary fibrosis (HCC)    one time (04/07/2017)   Spinal stenosis     Past Surgical History:  Procedure Laterality Date   ABDOMINAL AORTAGRAM N/A 03/15/2011   Procedure: ABDOMINAL EZELLA;  Surgeon: Gaile LELON New, MD;  Location: [facility] CATH LAB;  Service: Cardiovascular;  Laterality: N/A;   ABDOMINAL AORTAGRAM N/A 04/12/2011   Procedure: ABDOMINAL EZELLA;  Surgeon: Gaile LELON New, MD;  Location: [facility] CATH LAB;  Service: Cardiovascular;  Laterality: N/A;   APPENDECTOMY     BACK SURGERY     BRONCHIAL BIOPSY  03/03/2021   Procedure: BRONCHIAL BIOPSIES;  Surgeon: Brenna Adine CROME, DO;  Location: [facility] ENDOSCOPY;  Service: Pulmonary;;   BRONCHIAL BRUSHINGS  03/03/2021   Procedure: BRONCHIAL BRUSHINGS;  Surgeon: Brenna Adine CROME, DO;  Location: [facility] ENDOSCOPY;  Service: Pulmonary;;   BRONCHIAL NEEDLE ASPIRATION BIOPSY  03/03/2021   Procedure: BRONCHIAL NEEDLE ASPIRATION BIOPSIES;  Surgeon: Brenna Adine CROME, DO;  Location: [facility] ENDOSCOPY;  Service: Pulmonary;;   CARDIAC CATHETERIZATION  08/1987   CORONARY ANGIOPLASTY WITH STENT PLACEMENT  ~ 2002   [facility] [place]   CORONARY ARTERY BYPASS GRAFT  09/1987   CABG X1;  [facility] [place]; RCA   CYSTOSCOPY/URETEROSCOPY/HOLMIUM LASER/STENT PLACEMENT Right 03/14/2023   Procedure: CYSTOSCOPY, URETEROSCOPY, HOLMIUM LASER, STENT PLACEMENT;  Surgeon: Elisabeth Valli BIRCH, MD;  Location: [facility] ORS;  Service: Urology;  Laterality: Right;  60 MINUTE CASE   FIDUCIAL MARKER PLACEMENT  03/03/2021   Procedure: FIDUCIAL DYE MARKING;  Surgeon: Brenna Adine CROME, DO;  Location:  [facility] ENDOS[facility];  Service: Pulmonary;;   INTERCOSTAL NERVE BLOCK  03/03/2021   Procedure: INTERCOSTAL NERVE BLOCK;  Surgeon: Shyrl Linnie KIDD, MD;  Location: [facility] OR;  Service: Thoracic;;   JOINT REPLACEMENT     KNEE ARTHROSCOPY Left    LAPAROSCOPIC CHOLECYSTECTOMY     LEFT HEART CATH AND CORONARY ANGIOGRAPHY N/A 08/04/2023   Procedure: LEFT HEART CATH AND CORONARY ANGIOGRAPHY;  Surgeon: Elmira Newman PARAS, MD;  Location: [facility] INVASIVE CV LAB;  Service: Cardiovascular;  Laterality: N/A;   LYMPH NODE BIOPSY  03/03/2021   Procedure: LYMPH NODE BIOPSY;  Surgeon: Shyrl Linnie KIDD, MD;  Location: [facility] OR;  Service: Thoracic;;   LYMPH NODE DISSECTION  03/03/2021   Procedure: LYMPH NODE DISSECTION;  Surgeon: Shyrl Linnie KIDD, MD;  Location: [facility] OR;  Service: Thoracic;;   PERIPHERAL VASCULAR INTERVENTION Left 2016   put a stent; right branch of left femoral   POSTERIOR LUMBAR FUSION  2009   lumbar fusion, S L 5 to L 4   RIGHT/LEFT HEART CATH AND CORONARY/GRAFT ANGIOGRAPHY N/A 10/05/2020   Procedure: RIGHT/LEFT HEART CATH AND CORONARY/GRAFT ANGIOGRAPHY;  Surgeon: Mady Bruckner, MD;  Location: [facility] INVASIVE CV LAB;  Service: Cardiovascular;  Laterality: N/A;   SHOULDER ARTHROSCOPY W/ ROTATOR CUFF REPAIR Right 2006   SHOULDER ARTHROSCOPY WITH SUBACROMIAL DECOMPRESSION AND OPEN ROTATOR C Left ~ 1983   SPINE SURGERY     TONSILLECTOMY AND ADENOIDECTOMY     TOTAL HIP ARTHROPLASTY Right 2012   TOTAL HIP ARTHROPLASTY Left 04/07/2017   TOTAL HIP ARTHROPLASTY Left 04/07/2017   Procedure: LEFT TOTAL HIP ARTHROPLASTY;  Surgeon: Shari Sieving, MD;  Location: [facility] OR;  Service: Orthopedics;  Laterality: Left;   VIDEO BRONCHOSCOPY WITH RADIAL ENDOBRONCHIAL ULTRASOUND  03/03/2021   Procedure: VIDEO BRONCHOSCOPY WITH RADIAL ENDOBRONCHIAL ULTRASOUND;  Surgeon: Brenna Adine CROME, DO;  Location: [facility] ENDOS[facility];  Service: Pulmonary;;    Allergies[1]  Current Outpatient Medications  Medication Sig Dispense  Refill   acetaminophen  (TYLENOL ) 500 MG tablet Take 1 tablet (500 mg total) by mouth every 6 (six) hours as needed for mild pain.     ALPRAZolam  (XANAX ) 0.5 MG tablet Take 0.5-1 tablets (0.25-0.5 mg total) by mouth 3 (three) times daily as needed for anxiety or sleep. (Patient taking differently: Take 0.5 mg by mouth 4 (four) times daily as needed for anxiety or sleep.) 5 tablet 0   antiseptic oral rinse (BIOTENE) LIQD 15 mLs by Mouth Rinse route as needed for dry mouth or mouth pain.     apixaban  (ELIQUIS ) 5 MG TABS tablet Take 1 tablet (5 mg total) by mouth 2 (two) times  daily. Resume 08/04/2023 afternoon 180 tablet 1   atorvastatin  (LIPITOR ) 80 MG tablet TAKE 1 TABLET BY MOUTH EVERY DAY 90 tablet 3   carvedilol  (COREG ) 6.25 MG tablet Take 0.5 tablets (3.125 mg total) by mouth 2 (two) times daily for 3 days. (Patient not taking: Reported on 02/06/2024)     cholecalciferol  (VITAMIN D3) 25 MCG (1000 UNIT) tablet Take 1,000 Units by mouth 3 (three) times a week.     diphenhydrAMINE  (BENADRYL ) 25 mg capsule Take 25 mg by mouth daily as needed (sinus drainage).     guaiFENesin  (MUCINEX ) 600 MG 12 hr tablet Take 1 tablet (600 mg total) by mouth 2 (two) times daily as needed. (Patient taking differently: Take 600 mg by mouth 2 (two) times daily as needed. As needed)     HYDROcodone -acetaminophen  (NORCO/VICODIN) 5-325 MG tablet Take 1 tablet by mouth 5 (five) times daily.     hyoscyamine  (LEVSIN , ANASPAZ ) 0.125 MG tablet Take 0.125 mg by mouth every 4 (four) hours as needed for cramping.      methocarbamol  (ROBAXIN ) 750 MG tablet Take 750 mg by mouth every 8 (eight) hours as needed for muscle spasms.     nitroGLYCERIN  (NITROSTAT ) 0.4 MG SL tablet Place 1 tablet (0.4 mg total) under the tongue every 5 (five) minutes as needed for chest pain. 25 tablet 8   OXYGEN  Inhale 5 L/min into the lungs continuous.     pantoprazole  (PROTONIX ) 40 MG tablet TAKE 1 TABLET BY MOUTH EVERY DAY BEFORE BREAKFAST 90 tablet 2    PARoxetine  (PAXIL ) 20 MG tablet Take 20 mg by mouth daily.     Pirfenidone  801 MG TABS Take 1 tablet (801 mg total) by mouth 3 (three) times daily with meals. 270 tablet 1   sodium chloride  (OCEAN) 0.65 % SOLN nasal spray Place 1 spray into both nostrils daily as needed for congestion.     tamsulosin  (FLOMAX ) 0.4 MG CAPS capsule Take 0.4 mg by mouth in the morning and at bedtime.     triamcinolone  cream (KENALOG ) 0.1 % Apply 1 Application topically 2 (two) times daily as needed (for itching).     No current facility-administered medications for this visit.    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   Vaping Use   Vaping status: Every Day   Last attempt to quit: 12/29/2020  Substance and Sexual Activity   Alcohol use: Yes    Comment: couple drinks/month   Drug use: No   Sexual activity: Not Currently  Other Topics Concern   Not on file  Social History Narrative   ** Merged History Encounter **  Social Drivers of Health   Tobacco Use: Medium Risk (02/06/2024)   Patient History    Smoking Tobacco Use: Former    Smokeless Tobacco Use: Never    Passive Exposure: Not on file  Financial Resource Strain: Low Risk (08/01/2023)   Received from [facility]   Overall Financial Resource Strain  (CARDIA)    Difficulty of Paying Living Expenses: Not hard at all  Food Insecurity: No Food Insecurity (08/01/2023)   Received from [facility]   Epic    Within the past 12 months, you worried that your food would run out before you got the money to buy more.: Never true    Within the past 12 months, the food you bought just didn't last and you didn't have money to get more.: Never true  Transportation Needs: No Transportation Needs (08/01/2023)   Received from [facility] - Transportation    Lack of Transportation (Medical): No    Lack of Transportation (Non-Medical): No  Physical Activity: Not on file  Stress: Not on file  Social Connections: Not on file  Intimate Partner Violence: Not At Risk (01/18/2023)   Humiliation, Afraid, Rape, and Kick questionnaire    Fear of Current or Ex-Partner: No    Emotionally Abused: No    Physically Abused: No    Sexually Abused: No  Depression (PHQ2-9): High Risk (11/22/2021)   Depression (PHQ2-9)    PHQ-2 Score: 18  Alcohol Screen: Not on file  Housing: Low Risk (08/01/2023)   Received from [facility]    In the last 12 months, was there a time when you were not able to pay the mortgage or rent on time?: No    In the past 12 months, how many times have you moved where you were living?: 0    At any time in the past 12 months, were you homeless or living in a shelter (including now)?: No  Utilities: Not At Risk (08/01/2023)   Received from [facility] Utilities    Threatened with loss of utilities: No  Health Literacy: Not on file     REVIEW OF SYSTEMS:   [X]  denotes positive finding, [ ]  denotes negative finding Cardiac  Comments:  Chest pain or chest pressure:    Shortness of breath upon exertion:    Short of breath when lying flat:    Irregular heart rhythm:        Vascular    Pain in calf, thigh, or hip brought on by ambulation:    Pain in feet at night that wakes you up from your sleep:     Blood clot in  your veins:    Leg swelling:         Pulmonary    Oxygen  at home:    Productive cough:     Wheezing:         Neurologic    Sudden weakness in arms or legs:     Sudden numbness in arms or legs:     Sudden onset of difficulty speaking or slurred speech:    Temporary loss of vision in one eye:     Problems with dizziness:         Gastrointestinal    Blood in stool:     Vomited blood:         Genitourinary    Burning when urinating:     Blood in urine:        Psychiatric  Major depression:         Hematologic    Bleeding problems:    Problems with blood clotting too easily:        Skin    Rashes or ulcers:        Constitutional    Fever or chills:      PHYSICAL EXAMINATION:  Today's Vitals   03/04/24 0952  BP: 124/81  TempSrc: Temporal  Weight: 163 lb 14.4 oz (74.3 kg)  Height: 6' (1.829 m)   Body mass index is 22.23 kg/m.   General:  WDWN in NAD; vital signs documented above Gait: Not observed HENT: WNL, normocephalic Pulmonary: normal non-labored breathing , without wheezing Cardiac: regular HR, without carotid bruits Abdomen: soft, NT; aortic pulse is not palpable Skin: without rashes Vascular Exam/Pulses:  Right Left  Radial 2+ (normal) 2+ (normal)  DP monophasic Brisk biphasic  PT monophasic Brisk biphasic  Peroneal monophasic Brisk biphasic   Extremities: without ischemic changes, without Gangrene , without cellulitis; without open wounds Musculoskeletal: no muscle wasting or atrophy  Neurologic: A&O X 3 Psychiatric:  The pt has Normal affect.   Non-Invasive Vascular Imaging:   ABI's/TBI's on 03/04/2024: Right:  1.09/0.62 - Great toe pressure: 88 Left:  1.12/0.47 - Great toe pressure: 66  Arterial duplex on 03/04/2024: +-----------+--------+-----+--------+----------+-------------------------- LEFT      PSV cm/sRatioStenosisWaveform  Comments  +-----------+--------+-----+--------+----------+-------------------------- CFA Prox   107                   triphasic                            +-----------+--------+-----+--------+----------+-------------------------- CFA Distal 99                   biphasic   posterior calcified plaque +-----------+--------+-----+--------+----------+-------------------------  DFA       87                   monophasic                           +-----------+--------+-----+--------+----------+-------------------------- SFA Prox   98                   biphasic                             +-----------+--------+-----+--------+----------+-------------------------- SFA Mid    82                   biphasic   posterior calcified plaque +-----------+--------+-----+--------+----------+-------------------------- POP Distal 47                   triphasic                            +-----------+--------+-----+--------+----------+-------------------------- TP Trunk   71                   biphasic                             +-----------+--------+-----+--------+----------+-------------------------- ATA Distal 33                   biphasic                             +-----------+--------+-----+--------+----------+--------------------------  PTA Distal 48                   biphasic                             +-----------+--------+-----+--------+----------+------------------------- PERO Distal39                   biphasic                             +-----------+--------+-----+--------+----------+-------------------------- Left Stent(s):  +---------------------+--------+--------+---------+------------------------  MID SFA-PROX POPITEALPSV cm/sStenosisWaveform Comments                 +---------------------+--------+--------+---------+------------------------  Prox to Stent        55              biphasic                          +---------------------+--------+--------+---------+------------------------  Proximal Stent       54               biphasic                          +---------------------+--------+--------+---------+------------------------  Mid Stent            112             biphasic  area of calcified shadowing plaque, can not rule out higher grade stenosis versus occlusion within         +---------------------+--------+--------+---------+------------------------  Distal Stent         73              biphasic                          +---------------------+--------+--------+---------+------------------------  Distal to Stent      53              triphasic                         +---------------------+--------+--------+---------+------------------------  Patent stent. No evidence of stenosis seen.   Previous ABI's/TBI's on 12/21/2022: Right:  0.95/0.74 - Great toe pressure: 114 Left:  1.05/0.63 - Great toe pressure:  98  Previous arterial duplex on 12/21/2022: Summary:  Left: 50-74% stenosis noted in the deep femoral artery. Patent stent with no evidence of stenosis in the superficial femoral artery.     ASSESSMENT/PLAN:: 74 y.o. male here for follow up for PAD with hx of atherectomy and stent placement of his left superficial femoral and popliteal artery for left leg claudication in 2017 by Dr.Brabham.    -pt with palpable left DP pulse and brisk biphasic doppler flow left foot.  He has monophasic signals in the right.  He denies any claudication, rest pain or non healing wounds.  He does have a scab on the right great toe from the dog stepping on his toe.  Discussed continuing to monitor this and if it worsens, will see him back sooner.  Discussed protecting his feet.  -continue Eliquis /statin. -discussed importance of increased walking daily -pt will f/u in one year with ABI and RLE arterial duplex. -of note, he does have family hx of AAA with his father.  He had CT scan in 2018 of a/p with contrast and  there was no AAA  noted.  His aorta is not palpable on exam.     Lucie Apt,  [facility] [phone]  Clinic MD:   [place]     [1]  Allergies Allergen Reactions   Ambien [Zolpidem Tartrate] Other (See Comments)    Bad dreams/Vivid Dreams   Metoprolol  Rash   "

## 2024-03-29 ENCOUNTER — Telehealth: Payer: Self-pay

## 2024-03-29 NOTE — Telephone Encounter (Signed)
 I called and spoke with the patient/wife, I verified that they use [facility].  They received a letter in the mail regarding his oxygen  and the need for it.  I advised them that he needs to re qualify for the oxygen , he needs a walk test in the office, office visit and new orders to the DME.  I scheduled him for 04/23/24 at 11:15 am, advised to arrive at 11:00 for check in.  I advised her to call the DME company and let them know he had an appointment for a walk test/OV so they can notate it in his record with them so they will not continue to call and send letters.  She verified understanding.  Nothing further needed.

## 2024-03-29 NOTE — Telephone Encounter (Signed)
 Copied from CRM #[id number]. Topic: Clinical - Order For Equipment >> Mar 29, 2024 11:50 AM Russell PARAS wrote: Reason for CRM:   Pts wife is contacting clinic due to being advised by DME company that they need a new prescription for his oxygen  supplies.  Requested order be sent to [facility]  Also requested call back once order sent, for status update  CB#  [phone]

## 2024-04-23 ENCOUNTER — Ambulatory Visit: Admitting: Pulmonary Disease

## 2024-09-23 ENCOUNTER — Other Ambulatory Visit

## 2024-09-30 ENCOUNTER — Ambulatory Visit: Admitting: Internal Medicine
